# Patient Record
Sex: Female | Born: 1960
Health system: Southern US, Community
[De-identification: ages and names within clinical notes are randomized; demographics above are authoritative.]

## PROBLEM LIST (undated history)

## (undated) DIAGNOSIS — K5730 Diverticulosis of large intestine without perforation or abscess without bleeding: Secondary | ICD-10-CM

## (undated) DIAGNOSIS — M6289 Other specified disorders of muscle: Secondary | ICD-10-CM

## (undated) DIAGNOSIS — D369 Benign neoplasm, unspecified site: Secondary | ICD-10-CM

## (undated) DIAGNOSIS — R519 Headache, unspecified: Secondary | ICD-10-CM

## (undated) DIAGNOSIS — G4730 Sleep apnea, unspecified: Secondary | ICD-10-CM

## (undated) DIAGNOSIS — F329 Major depressive disorder, single episode, unspecified: Secondary | ICD-10-CM

## (undated) DIAGNOSIS — F319 Bipolar disorder, unspecified: Secondary | ICD-10-CM

## (undated) DIAGNOSIS — E669 Obesity, unspecified: Secondary | ICD-10-CM

## (undated) DIAGNOSIS — R51 Headache: Secondary | ICD-10-CM

## (undated) DIAGNOSIS — I10 Essential (primary) hypertension: Secondary | ICD-10-CM

## (undated) DIAGNOSIS — R87629 Unspecified abnormal cytological findings in specimens from vagina: Secondary | ICD-10-CM

## (undated) DIAGNOSIS — M722 Plantar fascial fibromatosis: Secondary | ICD-10-CM

## (undated) DIAGNOSIS — L409 Psoriasis, unspecified: Secondary | ICD-10-CM

## (undated) DIAGNOSIS — G629 Polyneuropathy, unspecified: Secondary | ICD-10-CM

## (undated) DIAGNOSIS — D649 Anemia, unspecified: Secondary | ICD-10-CM

## (undated) DIAGNOSIS — Z8719 Personal history of other diseases of the digestive system: Secondary | ICD-10-CM

## (undated) DIAGNOSIS — M1990 Unspecified osteoarthritis, unspecified site: Secondary | ICD-10-CM

## (undated) DIAGNOSIS — F32A Depression, unspecified: Secondary | ICD-10-CM

## (undated) DIAGNOSIS — E119 Type 2 diabetes mellitus without complications: Secondary | ICD-10-CM

## (undated) DIAGNOSIS — K219 Gastro-esophageal reflux disease without esophagitis: Secondary | ICD-10-CM

## (undated) DIAGNOSIS — F419 Anxiety disorder, unspecified: Secondary | ICD-10-CM

## (undated) HISTORY — DX: Depression, unspecified: F32.A

## (undated) HISTORY — PX: KNEE SURGERY: SHX244

## (undated) HISTORY — PX: TUBAL LIGATION: SHX77

## (undated) HISTORY — DX: Bipolar disorder, unspecified: F31.9

## (undated) HISTORY — DX: Type 2 diabetes mellitus without complications: E11.9

## (undated) HISTORY — DX: Unspecified osteoarthritis, unspecified site: M19.90

## (undated) HISTORY — DX: Headache, unspecified: R51.9

## (undated) HISTORY — DX: Psoriasis, unspecified: L40.9

## (undated) HISTORY — DX: Anxiety disorder, unspecified: F41.9

## (undated) HISTORY — PX: ABDOMINAL HYSTERECTOMY: SHX81

## (undated) HISTORY — DX: Benign neoplasm, unspecified site: D36.9

## (undated) HISTORY — DX: Obesity, unspecified: E66.9

## (undated) HISTORY — DX: Diverticulosis of large intestine without perforation or abscess without bleeding: K57.30

## (undated) HISTORY — DX: Unspecified abnormal cytological findings in specimens from vagina: R87.629

## (undated) HISTORY — DX: Plantar fascial fibromatosis: M72.2

## (undated) HISTORY — DX: Other specified disorders of muscle: M62.89

## (undated) HISTORY — DX: Headache: R51

## (undated) HISTORY — DX: Major depressive disorder, single episode, unspecified: F32.9

## (undated) HISTORY — PX: COLON SURGERY: SHX602

---

## 1999-11-27 ENCOUNTER — Encounter: Admission: RE | Admit: 1999-11-27 | Discharge: 1999-11-27 | Payer: Self-pay | Admitting: Family Medicine

## 1999-11-27 ENCOUNTER — Encounter: Payer: Self-pay | Admitting: Family Medicine

## 2000-11-09 ENCOUNTER — Ambulatory Visit (HOSPITAL_COMMUNITY): Admission: RE | Admit: 2000-11-09 | Discharge: 2000-11-09 | Payer: Self-pay | Admitting: Urology

## 2000-11-09 ENCOUNTER — Encounter: Payer: Self-pay | Admitting: Urology

## 2001-03-09 ENCOUNTER — Encounter: Payer: Self-pay | Admitting: Internal Medicine

## 2001-03-09 ENCOUNTER — Ambulatory Visit (HOSPITAL_COMMUNITY): Admission: RE | Admit: 2001-03-09 | Discharge: 2001-03-09 | Payer: Self-pay | Admitting: Internal Medicine

## 2001-10-06 ENCOUNTER — Encounter: Payer: Self-pay | Admitting: Family Medicine

## 2001-10-06 ENCOUNTER — Ambulatory Visit (HOSPITAL_COMMUNITY): Admission: RE | Admit: 2001-10-06 | Discharge: 2001-10-06 | Payer: Self-pay | Admitting: Family Medicine

## 2001-10-24 ENCOUNTER — Ambulatory Visit: Admission: RE | Admit: 2001-10-24 | Discharge: 2001-10-24 | Payer: Self-pay | Admitting: Family Medicine

## 2001-12-29 ENCOUNTER — Ambulatory Visit (HOSPITAL_COMMUNITY): Admission: RE | Admit: 2001-12-29 | Discharge: 2001-12-29 | Payer: Self-pay | Admitting: Family Medicine

## 2001-12-29 ENCOUNTER — Encounter: Payer: Self-pay | Admitting: Family Medicine

## 2002-02-06 ENCOUNTER — Inpatient Hospital Stay (HOSPITAL_COMMUNITY): Admission: RE | Admit: 2002-02-06 | Discharge: 2002-02-09 | Payer: Self-pay | Admitting: Obstetrics & Gynecology

## 2002-02-09 ENCOUNTER — Encounter: Payer: Self-pay | Admitting: Obstetrics & Gynecology

## 2002-02-21 ENCOUNTER — Encounter: Payer: Self-pay | Admitting: *Deleted

## 2002-02-21 ENCOUNTER — Emergency Department (HOSPITAL_COMMUNITY): Admission: EM | Admit: 2002-02-21 | Discharge: 2002-02-21 | Payer: Self-pay | Admitting: *Deleted

## 2002-03-12 ENCOUNTER — Ambulatory Visit (HOSPITAL_COMMUNITY): Admission: RE | Admit: 2002-03-12 | Discharge: 2002-03-12 | Payer: Self-pay | Admitting: Internal Medicine

## 2002-03-12 ENCOUNTER — Encounter: Payer: Self-pay | Admitting: Internal Medicine

## 2002-03-13 ENCOUNTER — Emergency Department (HOSPITAL_COMMUNITY): Admission: EM | Admit: 2002-03-13 | Discharge: 2002-03-14 | Payer: Self-pay | Admitting: Emergency Medicine

## 2002-03-14 ENCOUNTER — Encounter: Payer: Self-pay | Admitting: Internal Medicine

## 2002-03-28 ENCOUNTER — Inpatient Hospital Stay (HOSPITAL_COMMUNITY): Admission: RE | Admit: 2002-03-28 | Discharge: 2002-04-10 | Payer: Self-pay | Admitting: General Surgery

## 2002-03-30 ENCOUNTER — Encounter: Payer: Self-pay | Admitting: General Surgery

## 2002-03-30 HISTORY — PX: COLON RESECTION: SHX5231

## 2002-03-30 HISTORY — PX: LAPAROSCOPIC SALPINGOOPHERECTOMY: SUR795

## 2002-07-10 ENCOUNTER — Inpatient Hospital Stay (HOSPITAL_COMMUNITY): Admission: RE | Admit: 2002-07-10 | Discharge: 2002-07-17 | Payer: Self-pay | Admitting: General Surgery

## 2002-07-10 HISTORY — PX: COLOSTOMY CLOSURE: SHX1381

## 2002-07-12 ENCOUNTER — Encounter: Payer: Self-pay | Admitting: General Surgery

## 2002-07-13 ENCOUNTER — Encounter: Payer: Self-pay | Admitting: General Surgery

## 2002-07-15 ENCOUNTER — Encounter: Payer: Self-pay | Admitting: General Surgery

## 2003-01-07 ENCOUNTER — Emergency Department (HOSPITAL_COMMUNITY): Admission: EM | Admit: 2003-01-07 | Discharge: 2003-01-07 | Payer: Self-pay | Admitting: Emergency Medicine

## 2003-01-07 ENCOUNTER — Encounter: Payer: Self-pay | Admitting: Emergency Medicine

## 2003-03-10 ENCOUNTER — Emergency Department (HOSPITAL_COMMUNITY): Admission: EM | Admit: 2003-03-10 | Discharge: 2003-03-10 | Payer: Self-pay | Admitting: Emergency Medicine

## 2003-04-03 ENCOUNTER — Ambulatory Visit (HOSPITAL_COMMUNITY): Admission: RE | Admit: 2003-04-03 | Discharge: 2003-04-03 | Payer: Self-pay | Admitting: Obstetrics & Gynecology

## 2003-05-23 ENCOUNTER — Emergency Department (HOSPITAL_COMMUNITY): Admission: EM | Admit: 2003-05-23 | Discharge: 2003-05-23 | Payer: Self-pay | Admitting: Emergency Medicine

## 2003-07-15 ENCOUNTER — Ambulatory Visit (HOSPITAL_COMMUNITY): Admission: RE | Admit: 2003-07-15 | Discharge: 2003-07-15 | Payer: Self-pay | Admitting: Internal Medicine

## 2003-07-23 ENCOUNTER — Inpatient Hospital Stay (HOSPITAL_COMMUNITY): Admission: RE | Admit: 2003-07-23 | Discharge: 2003-07-26 | Payer: Self-pay | Admitting: Psychiatry

## 2003-07-24 ENCOUNTER — Ambulatory Visit (HOSPITAL_COMMUNITY): Admission: RE | Admit: 2003-07-24 | Discharge: 2003-07-24 | Payer: Self-pay | Admitting: Psychiatry

## 2003-08-27 ENCOUNTER — Ambulatory Visit (HOSPITAL_COMMUNITY): Admission: RE | Admit: 2003-08-27 | Discharge: 2003-08-27 | Payer: Self-pay | Admitting: Internal Medicine

## 2003-08-28 HISTORY — PX: COLONOSCOPY: SHX174

## 2003-09-12 ENCOUNTER — Emergency Department (HOSPITAL_COMMUNITY): Admission: EM | Admit: 2003-09-12 | Discharge: 2003-09-12 | Payer: Self-pay | Admitting: Emergency Medicine

## 2004-01-16 ENCOUNTER — Emergency Department (HOSPITAL_COMMUNITY): Admission: EM | Admit: 2004-01-16 | Discharge: 2004-01-16 | Payer: Self-pay | Admitting: Emergency Medicine

## 2004-01-20 ENCOUNTER — Emergency Department (HOSPITAL_COMMUNITY): Admission: EM | Admit: 2004-01-20 | Discharge: 2004-01-21 | Payer: Self-pay | Admitting: Emergency Medicine

## 2004-01-21 ENCOUNTER — Ambulatory Visit (HOSPITAL_COMMUNITY): Admission: RE | Admit: 2004-01-21 | Discharge: 2004-01-21 | Payer: Self-pay | Admitting: Preventative Medicine

## 2004-02-04 ENCOUNTER — Ambulatory Visit (HOSPITAL_COMMUNITY): Admission: RE | Admit: 2004-02-04 | Discharge: 2004-02-04 | Payer: Self-pay | Admitting: Orthopaedic Surgery

## 2004-02-04 HISTORY — PX: KNEE ARTHROSCOPY: SUR90

## 2004-02-07 ENCOUNTER — Encounter (HOSPITAL_COMMUNITY): Admission: RE | Admit: 2004-02-07 | Discharge: 2004-02-07 | Payer: Self-pay | Admitting: Orthopaedic Surgery

## 2004-02-11 ENCOUNTER — Ambulatory Visit: Payer: Self-pay | Admitting: Psychiatry

## 2004-02-13 ENCOUNTER — Encounter (HOSPITAL_COMMUNITY): Admission: RE | Admit: 2004-02-13 | Discharge: 2004-03-14 | Payer: Self-pay | Admitting: Orthopaedic Surgery

## 2004-04-07 ENCOUNTER — Ambulatory Visit: Payer: Self-pay | Admitting: Psychiatry

## 2004-04-09 ENCOUNTER — Ambulatory Visit (HOSPITAL_COMMUNITY): Admission: RE | Admit: 2004-04-09 | Discharge: 2004-04-09 | Payer: Self-pay | Admitting: Family Medicine

## 2004-06-26 ENCOUNTER — Ambulatory Visit (HOSPITAL_COMMUNITY): Admission: RE | Admit: 2004-06-26 | Discharge: 2004-06-26 | Payer: Self-pay | Admitting: Orthopaedic Surgery

## 2004-07-22 ENCOUNTER — Ambulatory Visit (HOSPITAL_COMMUNITY): Admission: RE | Admit: 2004-07-22 | Discharge: 2004-07-22 | Payer: Self-pay | Admitting: Obstetrics & Gynecology

## 2004-09-01 ENCOUNTER — Ambulatory Visit: Payer: Self-pay | Admitting: Psychiatry

## 2004-09-03 ENCOUNTER — Emergency Department (HOSPITAL_COMMUNITY): Admission: EM | Admit: 2004-09-03 | Discharge: 2004-09-03 | Payer: Self-pay | Admitting: Emergency Medicine

## 2004-09-29 ENCOUNTER — Emergency Department (HOSPITAL_COMMUNITY): Admission: EM | Admit: 2004-09-29 | Discharge: 2004-09-29 | Payer: Self-pay | Admitting: Emergency Medicine

## 2004-10-01 ENCOUNTER — Emergency Department (HOSPITAL_COMMUNITY): Admission: EM | Admit: 2004-10-01 | Discharge: 2004-10-01 | Payer: Self-pay | Admitting: Emergency Medicine

## 2004-10-01 ENCOUNTER — Ambulatory Visit: Payer: Self-pay | Admitting: Psychiatry

## 2004-10-01 ENCOUNTER — Inpatient Hospital Stay (HOSPITAL_COMMUNITY): Admission: RE | Admit: 2004-10-01 | Discharge: 2004-10-05 | Payer: Self-pay | Admitting: Psychiatry

## 2004-10-27 ENCOUNTER — Encounter (HOSPITAL_COMMUNITY): Admission: RE | Admit: 2004-10-27 | Discharge: 2004-11-26 | Payer: Self-pay | Admitting: Orthopedic Surgery

## 2004-11-02 ENCOUNTER — Ambulatory Visit: Payer: Self-pay | Admitting: Internal Medicine

## 2004-11-03 ENCOUNTER — Ambulatory Visit: Payer: Self-pay | Admitting: Psychiatry

## 2004-11-06 ENCOUNTER — Ambulatory Visit (HOSPITAL_COMMUNITY): Admission: RE | Admit: 2004-11-06 | Discharge: 2004-11-06 | Payer: Self-pay | Admitting: Internal Medicine

## 2004-11-30 ENCOUNTER — Encounter (HOSPITAL_COMMUNITY): Admission: RE | Admit: 2004-11-30 | Discharge: 2004-12-30 | Payer: Self-pay | Admitting: Orthopedic Surgery

## 2004-12-31 ENCOUNTER — Ambulatory Visit (HOSPITAL_COMMUNITY): Admission: RE | Admit: 2004-12-31 | Discharge: 2004-12-31 | Payer: Self-pay | Admitting: Orthopaedic Surgery

## 2005-01-13 ENCOUNTER — Encounter (HOSPITAL_COMMUNITY): Admission: RE | Admit: 2005-01-13 | Discharge: 2005-02-05 | Payer: Self-pay | Admitting: Orthopaedic Surgery

## 2005-01-26 ENCOUNTER — Ambulatory Visit: Payer: Self-pay | Admitting: Psychiatry

## 2005-03-05 ENCOUNTER — Emergency Department (HOSPITAL_COMMUNITY): Admission: EM | Admit: 2005-03-05 | Discharge: 2005-03-05 | Payer: Self-pay | Admitting: Emergency Medicine

## 2005-04-19 ENCOUNTER — Ambulatory Visit (HOSPITAL_COMMUNITY): Admission: RE | Admit: 2005-04-19 | Discharge: 2005-04-19 | Payer: Self-pay | Admitting: Family Medicine

## 2005-04-27 ENCOUNTER — Ambulatory Visit: Payer: Self-pay | Admitting: Psychiatry

## 2005-05-18 ENCOUNTER — Ambulatory Visit: Payer: Self-pay | Admitting: Psychiatry

## 2005-06-29 ENCOUNTER — Ambulatory Visit: Payer: Self-pay | Admitting: Internal Medicine

## 2005-08-11 ENCOUNTER — Ambulatory Visit (HOSPITAL_COMMUNITY): Admission: RE | Admit: 2005-08-11 | Discharge: 2005-08-11 | Payer: Self-pay | Admitting: Family Medicine

## 2005-08-24 ENCOUNTER — Ambulatory Visit (HOSPITAL_COMMUNITY): Payer: Self-pay | Admitting: Psychiatry

## 2005-09-07 ENCOUNTER — Ambulatory Visit (HOSPITAL_COMMUNITY): Payer: Self-pay | Admitting: Psychiatry

## 2005-09-28 ENCOUNTER — Emergency Department (HOSPITAL_COMMUNITY): Admission: EM | Admit: 2005-09-28 | Discharge: 2005-09-28 | Payer: Self-pay | Admitting: Emergency Medicine

## 2005-12-21 ENCOUNTER — Encounter (INDEPENDENT_AMBULATORY_CARE_PROVIDER_SITE_OTHER): Payer: Self-pay | Admitting: *Deleted

## 2005-12-21 ENCOUNTER — Ambulatory Visit (HOSPITAL_COMMUNITY): Admission: RE | Admit: 2005-12-21 | Discharge: 2005-12-21 | Payer: Self-pay | Admitting: Orthopaedic Surgery

## 2006-02-10 ENCOUNTER — Ambulatory Visit (HOSPITAL_COMMUNITY): Payer: Self-pay | Admitting: Psychiatry

## 2006-03-03 ENCOUNTER — Emergency Department (HOSPITAL_COMMUNITY): Admission: EM | Admit: 2006-03-03 | Discharge: 2006-03-03 | Payer: Self-pay | Admitting: Emergency Medicine

## 2006-03-08 ENCOUNTER — Ambulatory Visit (HOSPITAL_COMMUNITY): Payer: Self-pay | Admitting: Psychiatry

## 2006-05-05 ENCOUNTER — Ambulatory Visit (HOSPITAL_COMMUNITY): Admission: RE | Admit: 2006-05-05 | Discharge: 2006-05-05 | Payer: Self-pay | Admitting: Family Medicine

## 2006-07-06 ENCOUNTER — Ambulatory Visit: Payer: Self-pay | Admitting: Internal Medicine

## 2006-07-14 ENCOUNTER — Ambulatory Visit: Payer: Self-pay | Admitting: Gastroenterology

## 2006-10-04 ENCOUNTER — Ambulatory Visit (HOSPITAL_COMMUNITY): Payer: Self-pay | Admitting: Psychiatry

## 2006-10-21 ENCOUNTER — Ambulatory Visit (HOSPITAL_COMMUNITY): Payer: Self-pay | Admitting: Psychiatry

## 2006-11-03 ENCOUNTER — Ambulatory Visit (HOSPITAL_COMMUNITY): Payer: Self-pay | Admitting: Psychiatry

## 2006-11-23 ENCOUNTER — Ambulatory Visit (HOSPITAL_COMMUNITY): Payer: Self-pay | Admitting: Psychiatry

## 2006-12-01 ENCOUNTER — Ambulatory Visit (HOSPITAL_COMMUNITY): Payer: Self-pay | Admitting: Psychiatry

## 2006-12-28 ENCOUNTER — Ambulatory Visit (HOSPITAL_COMMUNITY): Payer: Self-pay | Admitting: Psychiatry

## 2007-01-17 ENCOUNTER — Ambulatory Visit (HOSPITAL_COMMUNITY): Payer: Self-pay | Admitting: Psychiatry

## 2007-01-31 ENCOUNTER — Ambulatory Visit: Payer: Self-pay | Admitting: Internal Medicine

## 2007-01-31 ENCOUNTER — Ambulatory Visit (HOSPITAL_COMMUNITY): Payer: Self-pay | Admitting: Psychiatry

## 2007-02-14 ENCOUNTER — Ambulatory Visit (HOSPITAL_COMMUNITY): Payer: Self-pay | Admitting: Psychiatry

## 2007-04-04 ENCOUNTER — Ambulatory Visit (HOSPITAL_COMMUNITY): Payer: Self-pay | Admitting: Psychiatry

## 2007-05-11 DIAGNOSIS — D369 Benign neoplasm, unspecified site: Secondary | ICD-10-CM

## 2007-05-11 DIAGNOSIS — K5730 Diverticulosis of large intestine without perforation or abscess without bleeding: Secondary | ICD-10-CM

## 2007-05-11 HISTORY — PX: LAPAROSCOPIC GASTRIC BANDING: SHX1100

## 2007-05-11 HISTORY — DX: Diverticulosis of large intestine without perforation or abscess without bleeding: K57.30

## 2007-05-11 HISTORY — DX: Benign neoplasm, unspecified site: D36.9

## 2007-05-18 ENCOUNTER — Ambulatory Visit (HOSPITAL_COMMUNITY): Admission: RE | Admit: 2007-05-18 | Discharge: 2007-05-18 | Payer: Self-pay | Admitting: Family Medicine

## 2007-07-07 ENCOUNTER — Emergency Department (HOSPITAL_COMMUNITY): Admission: EM | Admit: 2007-07-07 | Discharge: 2007-07-07 | Payer: Self-pay | Admitting: Emergency Medicine

## 2007-09-06 ENCOUNTER — Other Ambulatory Visit: Admission: RE | Admit: 2007-09-06 | Discharge: 2007-09-06 | Payer: Self-pay | Admitting: Obstetrics & Gynecology

## 2007-10-16 ENCOUNTER — Ambulatory Visit (HOSPITAL_COMMUNITY): Admission: RE | Admit: 2007-10-16 | Discharge: 2007-10-16 | Payer: Self-pay | Admitting: Surgery

## 2007-10-17 ENCOUNTER — Encounter: Admission: RE | Admit: 2007-10-17 | Discharge: 2007-10-17 | Payer: Self-pay | Admitting: Surgery

## 2007-10-26 ENCOUNTER — Ambulatory Visit (HOSPITAL_COMMUNITY): Admission: RE | Admit: 2007-10-26 | Discharge: 2007-10-26 | Payer: Self-pay | Admitting: Surgery

## 2007-11-23 ENCOUNTER — Ambulatory Visit: Payer: Self-pay | Admitting: Internal Medicine

## 2007-12-11 ENCOUNTER — Ambulatory Visit (HOSPITAL_COMMUNITY): Admission: RE | Admit: 2007-12-11 | Discharge: 2007-12-11 | Payer: Self-pay | Admitting: Internal Medicine

## 2007-12-11 ENCOUNTER — Encounter: Payer: Self-pay | Admitting: Internal Medicine

## 2007-12-11 ENCOUNTER — Ambulatory Visit: Payer: Self-pay | Admitting: Internal Medicine

## 2007-12-11 HISTORY — PX: COLONOSCOPY: SHX174

## 2008-01-01 ENCOUNTER — Encounter (HOSPITAL_COMMUNITY): Admission: RE | Admit: 2008-01-01 | Discharge: 2008-01-31 | Payer: Self-pay | Admitting: Internal Medicine

## 2008-03-05 ENCOUNTER — Encounter: Admission: RE | Admit: 2008-03-05 | Discharge: 2008-03-19 | Payer: Self-pay | Admitting: Surgery

## 2008-03-19 ENCOUNTER — Ambulatory Visit (HOSPITAL_COMMUNITY): Admission: RE | Admit: 2008-03-19 | Discharge: 2008-03-20 | Payer: Self-pay | Admitting: Surgery

## 2008-04-02 ENCOUNTER — Encounter: Admission: RE | Admit: 2008-04-02 | Discharge: 2008-04-02 | Payer: Self-pay | Admitting: Surgery

## 2008-05-13 ENCOUNTER — Emergency Department (HOSPITAL_COMMUNITY): Admission: EM | Admit: 2008-05-13 | Discharge: 2008-05-13 | Payer: Self-pay | Admitting: Emergency Medicine

## 2008-06-04 ENCOUNTER — Ambulatory Visit (HOSPITAL_COMMUNITY): Admission: RE | Admit: 2008-06-04 | Discharge: 2008-06-04 | Payer: Self-pay | Admitting: Family Medicine

## 2008-09-02 DIAGNOSIS — K5900 Constipation, unspecified: Secondary | ICD-10-CM

## 2008-09-02 DIAGNOSIS — K219 Gastro-esophageal reflux disease without esophagitis: Secondary | ICD-10-CM

## 2008-09-02 DIAGNOSIS — E119 Type 2 diabetes mellitus without complications: Secondary | ICD-10-CM

## 2008-09-02 DIAGNOSIS — E669 Obesity, unspecified: Secondary | ICD-10-CM | POA: Insufficient documentation

## 2008-09-02 DIAGNOSIS — F39 Unspecified mood [affective] disorder: Secondary | ICD-10-CM | POA: Insufficient documentation

## 2008-09-02 DIAGNOSIS — F411 Generalized anxiety disorder: Secondary | ICD-10-CM | POA: Insufficient documentation

## 2008-09-02 DIAGNOSIS — M1990 Unspecified osteoarthritis, unspecified site: Secondary | ICD-10-CM | POA: Insufficient documentation

## 2008-09-03 ENCOUNTER — Ambulatory Visit: Payer: Self-pay | Admitting: Internal Medicine

## 2008-09-10 ENCOUNTER — Encounter: Payer: Self-pay | Admitting: Internal Medicine

## 2008-09-16 ENCOUNTER — Other Ambulatory Visit: Admission: RE | Admit: 2008-09-16 | Discharge: 2008-09-16 | Payer: Self-pay | Admitting: Obstetrics & Gynecology

## 2008-10-09 ENCOUNTER — Ambulatory Visit: Payer: Self-pay | Admitting: Internal Medicine

## 2008-10-10 DIAGNOSIS — D126 Benign neoplasm of colon, unspecified: Secondary | ICD-10-CM | POA: Insufficient documentation

## 2008-12-20 ENCOUNTER — Ambulatory Visit (HOSPITAL_COMMUNITY): Payer: Self-pay | Admitting: Psychiatry

## 2008-12-25 ENCOUNTER — Ambulatory Visit (HOSPITAL_COMMUNITY): Payer: Self-pay | Admitting: Psychiatry

## 2009-01-14 ENCOUNTER — Ambulatory Visit (HOSPITAL_COMMUNITY): Payer: Self-pay | Admitting: Psychiatry

## 2009-01-21 ENCOUNTER — Ambulatory Visit (HOSPITAL_COMMUNITY): Payer: Self-pay | Admitting: Psychiatry

## 2009-02-03 ENCOUNTER — Ambulatory Visit (HOSPITAL_COMMUNITY): Payer: Self-pay | Admitting: Psychiatry

## 2009-02-13 ENCOUNTER — Ambulatory Visit (HOSPITAL_COMMUNITY): Payer: Self-pay | Admitting: Psychiatry

## 2009-02-17 ENCOUNTER — Ambulatory Visit (HOSPITAL_COMMUNITY): Payer: Self-pay | Admitting: Psychiatry

## 2009-03-27 ENCOUNTER — Encounter (INDEPENDENT_AMBULATORY_CARE_PROVIDER_SITE_OTHER): Payer: Self-pay | Admitting: *Deleted

## 2009-04-01 ENCOUNTER — Ambulatory Visit (HOSPITAL_COMMUNITY): Payer: Self-pay | Admitting: Psychiatry

## 2009-04-17 ENCOUNTER — Ambulatory Visit (HOSPITAL_COMMUNITY): Payer: Self-pay | Admitting: Psychiatry

## 2009-05-13 ENCOUNTER — Ambulatory Visit (HOSPITAL_COMMUNITY): Payer: Self-pay | Admitting: Psychiatry

## 2009-05-15 ENCOUNTER — Ambulatory Visit (HOSPITAL_COMMUNITY): Payer: Self-pay | Admitting: Psychiatry

## 2009-06-09 ENCOUNTER — Ambulatory Visit (HOSPITAL_COMMUNITY): Payer: Self-pay | Admitting: Psychiatry

## 2009-06-13 ENCOUNTER — Ambulatory Visit (HOSPITAL_COMMUNITY): Admission: RE | Admit: 2009-06-13 | Discharge: 2009-06-13 | Payer: Self-pay | Admitting: Family Medicine

## 2009-07-10 ENCOUNTER — Ambulatory Visit (HOSPITAL_COMMUNITY): Payer: Self-pay | Admitting: Psychiatry

## 2009-07-21 ENCOUNTER — Ambulatory Visit (HOSPITAL_COMMUNITY): Payer: Self-pay | Admitting: Psychiatry

## 2009-08-07 ENCOUNTER — Ambulatory Visit (HOSPITAL_COMMUNITY): Payer: Self-pay | Admitting: Psychiatry

## 2009-08-20 ENCOUNTER — Ambulatory Visit (HOSPITAL_COMMUNITY): Payer: Self-pay | Admitting: Psychiatry

## 2009-09-22 ENCOUNTER — Other Ambulatory Visit: Admission: RE | Admit: 2009-09-22 | Discharge: 2009-09-22 | Payer: Self-pay | Admitting: Obstetrics & Gynecology

## 2009-09-23 ENCOUNTER — Ambulatory Visit (HOSPITAL_COMMUNITY): Payer: Self-pay | Admitting: Psychiatry

## 2009-09-24 ENCOUNTER — Ambulatory Visit (HOSPITAL_COMMUNITY): Payer: Self-pay | Admitting: Psychiatry

## 2009-10-20 ENCOUNTER — Ambulatory Visit: Payer: Self-pay | Admitting: Internal Medicine

## 2009-10-20 DIAGNOSIS — R112 Nausea with vomiting, unspecified: Secondary | ICD-10-CM

## 2009-10-30 ENCOUNTER — Ambulatory Visit (HOSPITAL_COMMUNITY): Payer: Self-pay | Admitting: Psychiatry

## 2009-11-25 ENCOUNTER — Ambulatory Visit (HOSPITAL_COMMUNITY): Payer: Self-pay | Admitting: Psychiatry

## 2009-11-27 ENCOUNTER — Telehealth (INDEPENDENT_AMBULATORY_CARE_PROVIDER_SITE_OTHER): Payer: Self-pay | Admitting: *Deleted

## 2009-11-27 ENCOUNTER — Ambulatory Visit (HOSPITAL_COMMUNITY): Payer: Self-pay | Admitting: Psychiatry

## 2009-11-28 ENCOUNTER — Ambulatory Visit: Payer: Self-pay | Admitting: Internal Medicine

## 2009-12-25 ENCOUNTER — Ambulatory Visit (HOSPITAL_COMMUNITY): Payer: Self-pay | Admitting: Psychiatry

## 2009-12-31 ENCOUNTER — Encounter: Payer: Self-pay | Admitting: Internal Medicine

## 2010-01-22 ENCOUNTER — Ambulatory Visit (HOSPITAL_COMMUNITY): Payer: Self-pay | Admitting: Psychiatry

## 2010-01-27 ENCOUNTER — Ambulatory Visit (HOSPITAL_COMMUNITY): Payer: Self-pay | Admitting: Psychiatry

## 2010-02-26 ENCOUNTER — Ambulatory Visit (HOSPITAL_COMMUNITY): Payer: Self-pay | Admitting: Psychiatry

## 2010-03-13 ENCOUNTER — Encounter: Payer: Self-pay | Admitting: Internal Medicine

## 2010-03-31 ENCOUNTER — Ambulatory Visit (HOSPITAL_COMMUNITY): Payer: Self-pay | Admitting: Psychiatry

## 2010-05-28 ENCOUNTER — Other Ambulatory Visit (HOSPITAL_COMMUNITY): Payer: Self-pay | Admitting: Family Medicine

## 2010-05-28 DIAGNOSIS — Z0000 Encounter for general adult medical examination without abnormal findings: Secondary | ICD-10-CM

## 2010-05-31 ENCOUNTER — Encounter: Payer: Self-pay | Admitting: Obstetrics and Gynecology

## 2010-05-31 ENCOUNTER — Encounter: Payer: Self-pay | Admitting: Family Medicine

## 2010-06-02 ENCOUNTER — Ambulatory Visit (HOSPITAL_COMMUNITY)
Admission: RE | Admit: 2010-06-02 | Discharge: 2010-06-02 | Payer: Self-pay | Source: Home / Self Care | Attending: Psychiatry | Admitting: Psychiatry

## 2010-06-11 NOTE — Progress Notes (Signed)
Summary: recommendation confirmed for referral to [facility] if pt still wants  Will contact patient and [facility] clinic  ---- Converted from flag ---- ---- 11/27/2009 2:30 PM, Hendricks Limes LPN wrote: LSL spoke with RMR and he recommended referral to [facility] if pt was still having problems  ---- 11/27/2009 1:12 PM, Minna Merritts wrote: can we make sure that provider agrees with recommendation for referral to [facility]...without order and at pt request is kind of conflicting but would proceed if provider indicates ok. ------------------------------  Appended Document: recommendation confirmed for referral to [facility] if pt still wants Referral paperwork and records faxed to [facility] clinic; awaiting appt confirmation

## 2010-06-11 NOTE — Assessment & Plan Note (Signed)
Summary: DROPPED OFF STOOL/SS   Pt returned one iFOBT and it was negative.      Allergies: 1)  ! Codeine 2)  ! Bactrim  Other Orders: Immuno-chemical Fecal Occult (95621)  Appended Document: DROPPED OFF STOOL/SS good; proceed w [facility] ref.  Appended Document: DROPPED OFF STOOL/SS [facility] referral has already been made; awaiting appt confirmation

## 2010-06-11 NOTE — Letter (Signed)
Summary: [facility] referral  [facility] referral   Imported By: Minna Merritts 11/28/2009 17:44:23  _____________________________________________________________________  External Attachment:    Type:   Image     Comment:   External Document

## 2010-06-11 NOTE — Letter (Signed)
Summary: CLINIC NOTE FROM [facility] NOTE FROM [facility]   Imported By: Rexene Alberts 12/31/2009 16:51:28  _____________________________________________________________________  External Attachment:    Type:   Image     Comment:   External Document

## 2010-06-11 NOTE — Assessment & Plan Note (Signed)
Summary: PASSING OUT,NAUSEA,CAN'T HAVE A BOWEL MOVEMENT PT THINKS SHE .Marland KitchenMarland Kitchen   Visit Type:  f/u Primary Care Provider:  [place]  Chief Complaint:  passing out, constipation, and nausea.  History of Present Illness: [name] is here for further evaluation of ongoing constipation, nausea, and syncope. Last seen one year ago. She has h/o lapband placement in 2009. She continues to loose weight, total of 80 pounds. She c/o ongoing pp n/v. Worse with stress. Her husband has PTSD and it makes her "crazy". She has f/u with bariatric surgeon in couple of weeks regarding n/v. She is concerned she may have ulcer. She denies heartburn, hematemesis, abd pain. She continues to have ongoing constipation. She gets urge to have BM but has trouble evacuating. She has BM twice per week. Stool is small in quantity and she has to strain. She now has syncope twice per week. She says it happens when she gets urge to have BM. Does not always occur with having the BM/straining. She saw neurologist and cardiologist who ran test and cleared her. She has to use enema when she gets urge in order to get the stool out. With questioning her about the Miralax, she is actually taking on one teaspoon twice a day, not 17g two times a day. Her sister recently had partial colectomy with colostomy bag for complicated diverticulitis.   Current Medications (verified): 1)  Xanax 1 Mg Tabs (Alprazolam) .... Take 1 Tablet By Mouth Two Times A Day 2)  Amitiza 24 Mcg Caps (Lubiprostone) .... Taking 2 Daily 3)  Prilosec Otc 20 Mg Tbec (Omeprazole Magnesium) .... Two Times A Day 4)  Stool Softner .... Take Two Every Day 5)  Miralax 34 Grams .... Once Daily 6)  Citracel .... As Directed 7)  Claritin .... Once Daily 8)  Flax Seed Oil .... Once Daily 9)  Fleets Enema .... As Directed 10)  Byetta 10 Mcg Pen 10 Mcg/0.41ml Soln (Exenatide) .... Morning and Lunch Time 11)  Ultracet 37.5-325 Mg Tabs (Tramadol-Acetaminophen) .... As Needed 12)  Equetro  300 Mg Xr12h-Cap (Carbamazepine (Antipsychotic)) .... Two Times A Day  Allergies (verified): 1)  ! Codeine 2)  ! Bactrim  Past History:  Past Medical History: TCS, 8/09-->marginal prep, surgical anastomosis at 25cm, pancolonic diverticula, descending colon adenoma, surveillance exam at 12/2012 osteoarthritis Anxiety Disorder Depression Diabetes  Past Surgical History: Reviewed history from 09/03/2008 and no changes required. HYSTERECTOMY SIGMOID RESECTION WITH COLOSTOMY AND SUBSEQUENT WAS TAKEDOWN ARTHROSCOPIC SURGERY BOTH KNEES HEEL SPUR SURGERY TUBAL LIGATION Lap Band Surgery  Family History: Father: deceased 72 cirrhosis Mother: deceased 22 CHF & Ovarian Cancer Siblings: 2 Aunt, CRC, age 33 Aunt, Breast cancer, age 40, deceased Cousin, deceased age 58, liver cancer  Social History: Marital Status: Married Children: 2 Boys Occupation: Control and instrumentation engineer, social work None smoker, no alcohol.  Review of Systems General:  Denies fever, chills, sweats, anorexia, fatigue, weakness, and weight loss. Eyes:  Denies vision loss. ENT:  Denies nasal congestion, sore throat, hoarseness, and difficulty swallowing. CV:  Denies chest pains, angina, palpitations, dyspnea on exertion, and peripheral edema. Resp:  Denies dyspnea at rest, dyspnea with exercise, cough, sputum, and wheezing. GI:  See HPI. GU:  Denies urinary burning and blood in urine. MS:  Denies joint pain / LOM. Derm:  Denies rash and itching. Neuro:  Complains of syncope; denies weakness, paralysis, frequent headaches, memory loss, and confusion. Psych:  Denies depression, anxiety, memory loss, and suicidal ideation. Endo:  Denies unusual weight change. Heme:  Denies  bruising and bleeding. Allergy:  Denies hives and rash.  Vital Signs:  Patient profile:   50 year old female Height:      67 inches Weight:      222 pounds BMI:     34.90 Temp:     97.3 degrees F oral Pulse rate:   60 / minute BP sitting:    118 / 72  (left arm) Cuff size:   regular  Vitals Entered By: Hendricks Limes LPN (October 20, 2009 10:58 AM)  Physical Exam  General:  Well developed, well nourished, no acute distress. Head:  Normocephalic and atraumatic. Eyes:  Conjunctivae pink, no scleral icterus.  Mouth:  Oropharyngeal mucosa moist, pink.  No lesions, erythema or exudate.    Neck:  Supple; no masses or thyromegaly. Lungs:  Clear throughout to auscultation. Heart:  Regular rate and rhythm; no murmurs, rubs,  or bruits. Abdomen:  lap band port in right upper abd. Abd soft. NT. ND. No HSM or masses. No abd bruit or hernia. Positive BS. Extremities:  No clubbing, cyanosis, edema or deformities noted. Neurologic:  Alert and  oriented x4;  grossly normal neurologically. Skin:  Intact without significant lesions or rashes. Psych:  Alert and cooperative. Normal mood and affect.  Impression & Recommendations:  Problem # 1:  CONSTIPATION (ICD-564.00)  Persistent constipation. Prior Sitz marker study unremarkable. She describes difficulty with evacuation. She needs to increase her Miralax. She is taking only fraction of full dose. Increase to 17g two times a day. She will likely need referral to [facility] for anorectal manometry+/-defacography to access for functional outlet obstruction. She enquires about having another colonoscopy prior to referral. To discuss with Dr. Jena Gauss.  Syncope may be due to vasovagal response.   Orders: Est. Patient Level III (96045)  Problem # 2:  NAUSEA WITH VOMITING (ICD-787.01)  Postprandial n/v, s/p lap band. Symptoms intermittent and often without notice. Patient is to f/u with bariatric surgeon as planned. If symptoms persist, consider UGI series for further evaluation.   Orders: Est. Patient Level III (40981) Prescriptions: AMITIZA 24 MCG CAPS (LUBIPROSTONE) Take two daily with food  #60 x 5   Entered and Authorized by:   Leanna Battles. Dixon [name]   Signed by:   Leanna Battles Navi Erber PA-C on  10/20/2009   Method used:   Electronically to        [facility]. [phone]* (retail)       [address] [place], [place]  [address]       Ph: [phone]       Fax: [phone]   RxID:   [id number]   Appended Document: PASSING OUT,NAUSEA,CAN'T HAVE A BOWEL MOVEMENT PT THINKS SHE .Marland KitchenMarland Kitchen Please let pt know...finally discussed with RMR.  He wants to do ifobt. If negative, no need for TCS now. Recommends referral to [facility] for anorectal manometry +/- defography IF she is still having problems with constipation and evacuation of stool.   Appended Document: PASSING OUT,NAUSEA,CAN'T HAVE A BOWEL MOVEMENT PT THINKS SHE .Marland Kitchen. pt answered phone and said she was in an appt and would call back later  Appended Document: PASSING OUT,NAUSEA,CAN'T HAVE A BOWEL MOVEMENT PT THINKS SHE .Marland Kitchen. pt aware, will pick up ifobt today. left at front desk. pt still having problems with constipation and would like referral to [facility]

## 2010-06-11 NOTE — Letter (Signed)
Summary: [facility] PT BROUGHT BY OFFICE  [facility] RECORDS PT BROUGHT BY OFFICE   Imported By: Diana Eves 03/13/2010 13:05:50  _____________________________________________________________________  External Attachment:    Type:   Image     Comment:   External Document

## 2010-06-12 ENCOUNTER — Ambulatory Visit (HOSPITAL_COMMUNITY): Payer: Self-pay

## 2010-06-15 ENCOUNTER — Ambulatory Visit (HOSPITAL_COMMUNITY)
Admission: RE | Admit: 2010-06-15 | Discharge: 2010-06-15 | Disposition: A | Discharge status: Home / Self Care | Payer: Medicare Other | Source: Ambulatory Visit | Attending: Family Medicine | Admitting: Family Medicine

## 2010-06-15 ENCOUNTER — Ambulatory Visit (HOSPITAL_COMMUNITY): Admission: RE | Admit: 2010-06-15 | Payer: Self-pay | Source: Home / Self Care | Admitting: Family Medicine

## 2010-06-15 DIAGNOSIS — Z0000 Encounter for general adult medical examination without abnormal findings: Secondary | ICD-10-CM

## 2010-06-15 DIAGNOSIS — Z1231 Encounter for screening mammogram for malignant neoplasm of breast: Secondary | ICD-10-CM | POA: Insufficient documentation

## 2010-07-28 ENCOUNTER — Encounter (INDEPENDENT_AMBULATORY_CARE_PROVIDER_SITE_OTHER): Payer: Medicare Other | Admitting: Psychiatry

## 2010-07-28 DIAGNOSIS — F3189 Other bipolar disorder: Secondary | ICD-10-CM

## 2010-07-30 ENCOUNTER — Other Ambulatory Visit (HOSPITAL_COMMUNITY): Payer: Self-pay | Admitting: Family Medicine

## 2010-07-30 ENCOUNTER — Other Ambulatory Visit: Payer: Self-pay | Admitting: Internal Medicine

## 2010-07-30 DIAGNOSIS — R61 Generalized hyperhidrosis: Secondary | ICD-10-CM

## 2010-07-30 DIAGNOSIS — H539 Unspecified visual disturbance: Secondary | ICD-10-CM

## 2010-08-03 ENCOUNTER — Ambulatory Visit (HOSPITAL_COMMUNITY): Payer: Medicare Other

## 2010-08-10 ENCOUNTER — Ambulatory Visit
Admission: RE | Admit: 2010-08-10 | Discharge: 2010-08-10 | Disposition: A | Discharge status: Home / Self Care | Payer: Medicare Other | Source: Ambulatory Visit | Attending: Family Medicine | Admitting: Family Medicine

## 2010-08-10 DIAGNOSIS — R61 Generalized hyperhidrosis: Secondary | ICD-10-CM

## 2010-08-10 DIAGNOSIS — H539 Unspecified visual disturbance: Secondary | ICD-10-CM

## 2010-08-10 MED ORDER — GADOBENATE DIMEGLUMINE 529 MG/ML IV SOLN
10.0000 mL | Freq: Once | INTRAVENOUS | Status: AC | PRN
  Administered 2010-08-10: 10 mL via INTRAVENOUS

## 2010-08-24 LAB — CBC
Hemoglobin: 14.6 g/dL (ref 12.0–15.0)
MCHC: 32.8 g/dL (ref 30.0–36.0)
Platelets: 197 10*3/uL (ref 150–400)
RDW: 14.4 % (ref 11.5–15.5)
WBC: 10.2 10*3/uL (ref 4.0–10.5)

## 2010-08-24 LAB — DIFFERENTIAL
Basophils Relative: 1 % (ref 0–1)
Eosinophils Absolute: 0.2 10*3/uL (ref 0.0–0.7)
Lymphs Abs: 2.8 10*3/uL (ref 0.7–4.0)
Monocytes Absolute: 0.8 10*3/uL (ref 0.1–1.0)
Monocytes Relative: 7 % (ref 3–12)

## 2010-08-24 LAB — COMPREHENSIVE METABOLIC PANEL
ALT: 18 U/L (ref 0–35)
AST: 23 U/L (ref 0–37)
Albumin: 3.6 g/dL (ref 3.5–5.2)
Alkaline Phosphatase: 75 U/L (ref 39–117)
Calcium: 8.6 mg/dL (ref 8.4–10.5)
GFR calc Af Amer: >60 mL/min (ref >60)
Potassium: 4.2 mEq/L (ref 3.5–5.1)
Sodium: 136 mEq/L (ref 135–145)
Total Protein: 6.8 g/dL (ref 6.0–8.3)

## 2010-08-24 LAB — URINALYSIS, ROUTINE W REFLEX MICROSCOPIC
Bilirubin Urine: NEGATIVE
Glucose, UA: NEGATIVE mg/dL
Hgb urine dipstick: NEGATIVE
Specific Gravity, Urine: 1.01 (ref 1.005–1.030)

## 2010-09-15 ENCOUNTER — Encounter (INDEPENDENT_AMBULATORY_CARE_PROVIDER_SITE_OTHER): Payer: Self-pay | Admitting: Surgery

## 2010-09-22 ENCOUNTER — Encounter (INDEPENDENT_AMBULATORY_CARE_PROVIDER_SITE_OTHER): Payer: Medicare Other | Admitting: Psychiatry

## 2010-09-22 DIAGNOSIS — F3189 Other bipolar disorder: Secondary | ICD-10-CM

## 2010-09-22 NOTE — H&P (Signed)
NAMEPORCHE, STEINBERGER             ACCOUNT NO.:  [id number]   MEDICAL RECORD NO.:  [id number]          PATIENT TYPE:  AMB   LOCATION:  DAY                           FACILITY:  [facility]   PHYSICIAN:  R. Roetta Sessions, M.D. DATE OF BIRTH:  [birth year]   DATE OF ADMISSION:  10/16/2007  DATE OF DISCHARGE:  LH                              HISTORY & PHYSICAL   CHIEF COMPLAINT:  Refractory constipation, wants a colonoscopy for  bariatric surgery.   Mrs. [name] is a very pleasant morbidly obese 50 year old  lady previously seen by Dr. Karilyn Cota.  She has come to see me to desire a  colonoscopy and evaluate constipation further.  She had been constipated  for years.  She has a history of complicated diverticulitis with abscess  formation requiring sigmoid resection and colostomy with subsequent  takedown previously by Dr. Katrinka Blazing.  Her last colonoscopy was in 2005,  done by Dr. Karilyn Cota.  Because of poor response to treatment for  constipation, she did have residual diverticula, but no findings  consistent with adenoma or neoplasia.  She has not passed any blood per  rectum.  She barely goes 2-3 times weekly as far as bowel movements are  concerned despite taking Amitiza 24 mcg b.i.d. and MiraLax 70 grams  orally daily in addition to Citrucel, she also takes Fleet Enema on a  regular basis.  She has struggled with obesity for years.  She has seen  Dr. Benancio Deeds down at the [facility] and she has gone  through the evaluation with plans to perform a laparoscopic band  procedure in September or October and she asked that a colonoscopy be  done to make sure there is nothing going on in her colon prior to this  procedure.  She does tell me that she actually had aunt who died of  colorectal cancer in her 30s.  No first-degree relatives with polyps or  cancer.  She has gastroesophageal reflux.  These symptoms are well-  controlled on Prilosec 20 mg orally b.i.d.   PAST MEDICAL  HISTORY:  1. Depression.  2. Anxiety neurosis.  3. Osteoarthritis.  4. Type 2 diabetes mellitus.   CURRENT MEDICATIONS:  1. Metformin 500 grams b.i.d.  2. Xanax 1 mg daily.  3. Stool softener 2 daily.  4. MiraLax 17 g orally daily.  5. Citrucel fiber 1 dose daily.  6. Darvocet 1 p.r.n. for knee pain.  7. Claritin daily.  8. Flaxseed daily.  9. Fleet enemas daily p.r.n.  10.Amitiza 24 mcg b.i.d.  11.Citrucel supplement daily.  12.Prilosec 20 mg orally b.i.d.   ALLERGIES:  CODEINE and BACTRIM.   PAST SURGERIES:  Hysterectomy, sigmoid resection with colostomy and  subsequent was takedown, arthroscopic surgery both knees, heel spur  surgery, tubal ligation.   FAMILY HISTORY:  Colon cancer at young age.   SOCIAL HISTORY:  The patient is married.  She is unemployed.  No  alcohol.  No tobacco.  No illicit drugs.  No recent chest pain, dyspnea  on exertion.  Does not have any early satiety, dysphagia, nausea, or  vomiting.  PHYSICAL EXAMINATION:  GENERAL: A Pleasant 50 year old lady resting  comfortably.  No acute distress.  GENERAL:  Weight 282.5, height 5 feet 7, temp 98, BP 110/70, and pulse  84.  SKIN:  Warm and dry.  HEENT:  No scleral icterus.  Conjunctivae are pink.  CHEST:  Lungs are clear to auscultation.  CARDIAC:  Regular rate and rhythm without murmur, gallop, or rub.  ABDOMEN:  Massively obese, well-healed surgical scars.  Positive bowel  sounds, soft, nontender without appreciable mass or organomegaly.  EXTREMITIES:  No edema.  RECTAL:  Two small external hemorrhoid tags, good sphincter tone and no  mass.  No stool in the rectal vault.  Mucous Hemoccult negative.   IMPRESSION:  [name] is a pleasant 50 year old lady with  history of complicated diverticulitis as described above with refractory  constipation.  She is morbidly obese and bariatric surgery is  forthcoming.  [name] very much wants to have a colonoscopy to  clear her colon  prior to undergoing bariatric surgery.   She has had ongoing refractory symptoms of constipation now for years.  She had residual diverticula, but nothing else a little over 4 years ago  at her last colonoscopy.  I told [name] the likelihood of finding  the significant pathology would be relatively low.  However, she is  desirous of a colonoscopy and wants peace of mind.   She has a second-degree relative with colon cancer.   All things considered, I do not think it would  be unreasonable to go  ahead and recheck her colon prior to major GI surgery.   PLAN:  Plan in the near future.  I told Ms. Benninger about the risks,  benefits, alternatives, and limitations of a colonoscopy now.  Her  questions were answered.  She is agreeable.  We will go ahead and plan  to perform a colonoscopy at [facility] in the near future.  Further  recommendations to follow.      Jonathon Bellows, M.D.  Electronically Signed     RMR/MEDQ  D:  11/23/2007  T:  11/24/2007  Job:  [id number]   cc:   Madelin Rear. Sherwood Gambler, MD  Fax: [phone]   Dr.  [facility].

## 2010-09-22 NOTE — Op Note (Signed)
[name], [name]             ACCOUNT NO.:  [id number]   MEDICAL RECORD NO.:  [id number]          PATIENT TYPE:  AMB   LOCATION:  DAY                           FACILITY:  [facility]   PHYSICIAN:  R. Roetta Sessions, M.D. DATE OF BIRTH:  1960-09-12   DATE OF PROCEDURE:  12/11/2007  DATE OF DISCHARGE:                               OPERATIVE REPORT   PROCEDURE:  Colonoscopy with biopsy.   INDICATIONS FOR PROCEDURE:  A 50 year old lady, morbidly obese, with  history of complicated diverticulitis requiring resection, colostomy,  and subsequent takedown.  She is having refractory constipation and  things seem to be getting worse more recently.  She takes Amitiza 24 mcg  b.i.d. and MiraLax daily and struggles to have a bowel movement, has  second-degree relative with colon cancer.  Colonoscopy is now being  done.  This approach has been discussed with the patient at length, and  potential risks, benefits, and alternatives have been reviewed,  questions answered.  Please see documentation in the medical record.   PROCEDURE NOTE:  O2 saturation, blood pressure, pulse, and respiration  were monitored throughout the entire procedure.   CONSCIOUS SEDATION:  Versed 6 mg IV and Demerol 150 mg IV in divided  doses.   INSTRUMENT:  Pentax video chip system.   FINDINGS:  Digital rectal exam revealed no abnormalities.  Endoscopic  findings:  The prep was marginal.  There was some vegetable material and  liquid stool throughout the colon, which had to be dealt with via lavage  through the scope.  Colon:  Colonic mucosa was surveyed from the  rectosigmoid junction through the left transverse and right colon to the  appendiceal orifice, ileocecal valve, and cecum.  These structures were  well seen and photographed for the record.  From this level, scope was  slowly and cautiously withdrawn.  All previously-mentioned mucosal  surfaces were again seen.  The patient had scattered pancolonic  diverticula.   There is a diminutive polyp, mid descending colon, this  was cold biopsied/removed.  Surgical anastomosis 25 cm from the rectum.  Otherwise, the residual colonic mucosa appeared normal.  Scope was  pulled down to the rectum where thorough examination of rectal mucosa  including retroflexed view of the anal verge demonstrated no  abnormalities.  The patient tolerated the procedure well and was  reactive to endoscopy.   IMPRESSION:  1. Marginal prep.  2. Normal rectum.  3. Status post surgical anastomosis at 25 cm.  4. Pancolonic diverticula.  5. Polyp, mid descending colon, status post cold biopsy removal.  6. Remainder of colonic mucosa appeared unremarkable.   I feel this lady has a significant problem with constipation.  I get the  sense this is more of a colonic inertia than a functional outlet problem  issue.   RECOMMENDATIONS:  1. Constipation needs to be sorted out further before she goes for      bariatric surgery.  Go ahead and check a TSH, serum calcium, and      albumin today, and we will go ahead and do a sitz marker study next  week.  She is to hold Amitiza and MiraLax for 3 days prior to the      sitz marker study.  2. Further recommendations to follow.      Jonathon Bellows, M.D.  Electronically Signed     RMR/MEDQ  D:  12/11/2007  T:  12/11/2007  Job:  [id number]   cc:   Madelin Rear. Sherwood Gambler, MD  Fax: [phone]   Dr. Rex Kras

## 2010-09-22 NOTE — Assessment & Plan Note (Signed)
[name], [name]              CHART#:  [id number]   DATE:  01/31/2007                       DOB:  09/04/1960   This lady was last seen here in February of this year to follow up  gastroesophageal reflux disease, constipation predominant IBS, history  of diverticulitis requiring sigmoid resection with subsequent takedown  by Dr. Elpidio Anis back in 2004.  Constipation has been fairly well  managed with Amitiza 24 mcg b.i.d., MiraLax 17 g orally b.i.d., daily  Citrucel fiber supplement.  This produces a bowel movement on the order  of 3 times weekly.  She has not had any melena or rectal bleeding.  She  had some left lower quadrant abdominal pain, for which she saw Dr.  Phillips Odor recently.  She denies having any CT, et Karie Soda.  He put her on  some Flagyl.  Her left lower quadrant abdominal pain is getting better.  She did have low grade fever with it.  I have not seen this patient  previously, but I have reviewed the record.  This lady, back in 2000,  was seen by Dr. Dionicia Abler for blood in the stool.  A colonoscopy was  attempted.  He could not get around.  A barium enema suggested a large  polyp in the left colon.  She did not return for followup attempt  colonoscopy under fluoroscopy.  She got sick in 2004 and had her sigmoid  out, although I do not have any operative notes, there are no further  mention of polyps in the chart.  In 2005, Dr. Dionicia Abler was able to do a  complete colonoscopy (status post sigmoid resection).  She had some  residual diverticula, but everything else looked okay.  The patient does  report that she had a polyp previously, but we just do not know the path  or have any information at this time.  No family history of first degree  relatives with colon cancer.  Again, she has not had any rectal  bleeding.  Her weight is up 8 pounds since February 2008.  In addition  to the above-mentioned regimen for constipation, she takes 2 Colace at  bedtime.  Reflux symptoms are  well controlled on omeprazole twice daily.   PAST MEDICAL HISTORY:  Depression, anxiety, neurosis, osteoarthritis,  Type 2 diabetes mellitus.   CURRENT MEDICATIONS:  1. Metformin 500 mg b.i.d.  2. Actos daily.  3. Xanax 1 mg daily.  4. Premarin 1.25 mg daily.  5. Stool softener 2 daily.  6. MiraLax 17 g b.i.d.  7. Citrucel 1 dose daily.  8. Darvocet p.r.n.  9. Seroquel 100 mg b.i.d.  10.Claritin daily.  11.Flax seed daily.   PAST SURGICAL HISTORY:  Tubal ligation, hysterectomy, sigmoid resection  with colostomy subsequently taken down.  Arthroscopic surgeries both  legs.  Heel spur surgery.   ALLERGIES:  Codeine.  Bactrim.   FAMILY HISTORY:  A great aunt may have had colon cancer.  No first  degree relatives with any gastrointestinal illness.   SOCIAL HISTORY:  The patient is married.  She is unemployed.  Does not  smoke.  Does not use alcohol.   REVIEW OF SYSTEMS:  No chest pain.  No dyspnea on exertion.  No fevers  or chills.  No odynophagia.  No dysphagia.  No early satiety.  No nausea  or  vomiting.  Reflux symptoms well controlled on omeprazole.  Otherwise,  as per history of present illness.   PHYSICAL EXAMINATION:  The patient is a pleasant 50 year old morbidly  obese lady resting comfortably.  Weight 285 pounds, height 5 feet 7 inches, temperature 97.7, BP 110/76,  pulse 80.  SKIN:  Warm and dry.  There is no jaundice suggesting chronic liver  disease.  HEENT:  No scleral icterus.  Conjunctivae pink.  Oral cavity with no  lesions.  CHEST:  Lungs are clear to auscultation.  CARDIAC:  Regular rate and rhythm without murmur, gallop, or rub.  BREASTS:  Deferred.  ABDOMEN:  Massively obese.  Well-healed surgical scars.  Positive bowel  sounds.  Soft and entirely nontender to palpation.  No obvious mass or  organomegaly.  EXTREMITIES:  No edema.  RECTAL:  Good sphincter tone.  No mass in the rectal vault.  No stool in  the rectal vault.  Mucus is Hemoccult  negative.   IMPRESSION:  The patient is a pleasant 50 year old lady with a history  of complicated diverticulitis requiring sigmoid resection and subsequent  colostomy with takedown.  She has chronic constipation being managed  fairly well with a fairly aggressive regimen of stool softeners,  polyethelene glycol, MiraLax, and Amitiza.  She had a recent bout of  left lower quadrant abdominal pain, which by my take, was fairly mild  and has responded at least temporarily to a course of Flagyl.  The real  question I have is whether or not this lady had adenomatous or higher  grade pathology polyp in the resected colon, performed by Dr. Katrinka Blazing back  in 2004.  If she did have a precancerous polyp, that would change the  algorithm for colonoscopy.  At this point in time, I do not necessarily  see any need for a repeat colonoscopy any sooner than 2015.  The patient  states that she has the pertinent records and will supply [place] with  copies.  For the time being, I have recommended that she continue  omeprazole 20 mg orally b.i.d. for reflux, continue Amitiza 24 mcg  b.i.d., MiraLax 17 g orally b.i.d., and daily fiber supplementation.  I  have encouraged exercise and weight loss.  Will plan to see this nice  lady  back in 1 month, touch base, see how things are going, and have the  additional records available for review.       Jonathon Bellows, M.D.  Electronically Signed     RMR/MEDQ  D:  01/31/2007  T:  02/01/2007  Job:  [id number]   cc:   Edsel Petrin, D.O.

## 2010-09-22 NOTE — Op Note (Signed)
[name], [name]             ACCOUNT NO.:  [id number]   MEDICAL RECORD NO.:  [id number]          PATIENT TYPE:  OIB   LOCATION:  1531                         FACILITY:  [facility]   PHYSICIAN:  Sandria Bales. Ezzard Standing, M.D.  DATE OF BIRTH:  1960-08-12   DATE OF PROCEDURE:  03/19/2008  DATE OF DISCHARGE:                               OPERATIVE REPORT   Date of surgery ??   PREOPERATIVE DIAGNOSIS:  Morbid obesity.  (Weight 288, BMI 45.32).   POSTOPERATIVE DIAGNOSIS:  Morbid obesity.  (Weight 288, BMI 45.3).   PROCEDURE:  Lap band, AP large (subcutaneous skin placement of the  reservoir lateral to the incision).   SURGEON:  Sandria Bales. Ezzard Standing, M.D.   FIRST ASSISTANT:  Thornton Park. Daphine Deutscher, MD   ANESTHESIA:  General endotracheal.   ESTIMATED BLOOD LOSS:  Minimal.   PROCEDURE:  [name] is a 50 year old white female patient of [name], who has been morbidly obese most of her adult life.  She has  been through our preop bariatric program and interested in lap band  placement.   The indications and potential complications of procedure were explained  to the patient.  Potential complications include, but are not limited  to, bleeding, infection, bowel injury, slippage of band, erosion of  band, and long-term nutritional consequences.   OPERATIVE NOTE:  The patient placed in the supine position, given a  general endotracheal anesthetic.  She had antibiotics in place, PAS  stockings in place and her abdomen is prepped with HCG.   A time-out was held identifying the patient and the procedure.  The  abdomen was accessed through the left upper quadrant with an 11-mm  OptiVu trocar.  I placed five additional trocars, a 5-mm subxiphoid  trocar for the [facility] retractor, a 15-mm right subcostal trocar, an  11 mm right paramedian trocar, an 11 mm left paramedian trocar.   She did have some adhesions from her prior surgery to the anterior  abdominal wall.  I spent about 15 minutes taking  down these adhesions  but these were all omentum which was taken down fairly easily off the  anterior abdominal wall.   I then placed the Nathanson retractor and lifted the left lobe of the  liver, dissected up to the gastroesophageal junction.   Along the angle of His, left side of the gastroesophageal junction, I  made an opening in the peritoneum and used a finger dissector to dissect  along and identify the left crus.   I then opened the gastrohepatic ligament.  I got into the area behind  the gastrohepatic ligament, identified the right crus.  I passed the  finger dissector in a retrogastric fashion.   I then passed the sizing tube down.  Because of tertiary contractions on  her upper GI I was a hesitant to try to close any of her diaphragmatic  hiatux, but she had no obvious hiatal hernia on our balloon pull back  test.  I then used the large lap band AP large because of her size and  amount of fat, I passed this through the Silastic tubing  around behind  the gastroesophageal junction.  I then had the sizing tube in place and  cinched this over the sizing tube which fit nicely.   I then imbricated stomach over the lap band using a 0 Ethibond suture. I  placed three sutures and tied these with the tie knot system.   The lap band lay well.  I took photos of this and these were placed in  the chart.  There was no bleeding, no evidence of any bowel injury.  I  then brought the tubing out through the right paramedian incision.   I then removed the Nathanson retractor under direct visualization and  all the trocars.  There was no bleeding at any trocar site.  Because the  patient is really more of an apple than a pear shape and the thickness  her abdominal wall, I decided to placed this port as a subcutaneous  port, so I created a pocket lateral to the incision and inserted this  port in.  It was palpable and lay correctly.   I then placed a 3-0 Vicryl suture around this port  trying to kind of fix  it a little bit.   I then closed the skin with a running 5-0 Vicryl at each suture site,  painted the wounds with tincture of benzoin and steri-stripped.   The patient tolerated the procedure well, was transported to recovery  room in good condition.  Sponge and needle counts were correct at end of  the case.      Sandria Bales. Ezzard Standing, M.D.  Electronically Signed     DHN/MEDQ  D:  03/19/2008  T:  03/20/2008  Job:  [id number]   cc:   Corrie Mckusick, M.D.  Fax: [phone]

## 2010-09-25 ENCOUNTER — Other Ambulatory Visit: Payer: Self-pay | Admitting: Obstetrics & Gynecology

## 2010-09-25 ENCOUNTER — Other Ambulatory Visit (HOSPITAL_COMMUNITY)
Admission: RE | Admit: 2010-09-25 | Discharge: 2010-09-25 | Disposition: A | Discharge status: Home / Self Care | Payer: Medicare Other | Source: Ambulatory Visit | Attending: Obstetrics & Gynecology | Admitting: Obstetrics & Gynecology

## 2010-09-25 DIAGNOSIS — Z01419 Encounter for gynecological examination (general) (routine) without abnormal findings: Secondary | ICD-10-CM | POA: Insufficient documentation

## 2010-09-25 NOTE — H&P (Signed)
[name], [name]             ACCOUNT NO.:  [id number]   MEDICAL RECORD NO.:  [id number]           PATIENT TYPE:  AMB   LOCATION:  DAY                           FACILITY:  [facility]   PHYSICIAN:  J. Darreld Mclean, M.D. DATE OF BIRTH:  09-08-1960   DATE OF ADMISSION:  DATE OF DISCHARGE:  LH                                HISTORY & PHYSICAL   CHIEF COMPLAINT:  Right knee pain.   The patient is a 50 year old female with pain and tenderness in her right  knee.  She has giving way of her knee and locking of her knee.  I saw her in  the office on August 6.  Complained of significant pain and tenderness and  gave much more pain in the knee.  I injected the knee with some Depo-Medrol,  got an MRI and MRI shows tear of the medial meniscus.  She has a positive  McMurray's.  Surgery is recommended.  She has had trouble with the right  knee on and off for a period of time.  She is status post left knee  arthroscopy.   PREVIOUS SURGERY:  1. Hysterectomy 2003.  2. Left knee surgery arthroscopy x2.  3. Patient has had significant diverticulitis which required colon      resection, osteotomy bag, and then reversal.  This was done initially      in November 2003 and subsequent surgery 2004.   ALLERGIES:  CODEINE.   PAST MEDICAL HISTORY:  Negative except for her knee pains and her colon  surgeries.   CURRENT MEDICATIONS:  1. Equetro 300 mg daily.  2. Metformin 500 mg b.i.d.  3. Premarin 1.25 mg daily.  4. Naprosyn 500 mg p.o. b.i.d.  5. Darvocet-N 100 for pain.   SOCIAL HISTORY:  Patient does not smoke.  She does not use any alcoholic  beverages.  She is a Engineer, agricultural.  She works at [employer] in  [place].  She is married.  Dr. Phillips Odor is her family doctor.   PHYSICAL EXAMINATION:  VITAL SIGNS:  Her vital signs are within normal  limits, 5 feet 7.  Weight was 240 pounds.  GENERAL:  She is alert, cooperative, oriented.  HEENT:  Negative.  NECK:  Supple.  LUNGS:  Clear to  P&A.  HEART:  Rate with __________ murmur heard.  ABDOMEN:  Soft, nontender, without masses.  EXTREMITIES:  Right knee has got pain and tenderness, particularly medially,  effusion, crepitus.  Positive McMurray medially.  Drawer sign negative.  Left knee has got mild effusion, crepitus, negative Lachman, negative  McMurray.  __________ intact.   IMPRESSION:  Tear of the medial meniscus right knee.   PERTINENT PLAN:  Operative arthroscopy of the knee.  Discussed with patient  planned procedure, risks, and imponderables.  Appears to understand the  procedure having undergone arthroscopy of the knee previously.  Laboratories  are pending.  ______________________________  Shela Commons. Darreld Mclean, M.D.     JWK/MEDQ  D:  12/20/2005  T:  12/20/2005  Job:  [id number]

## 2010-09-25 NOTE — H&P (Signed)
Samantha, [name] NO.:  [id number]   MEDICAL RECORD NO.:  [id number]                  PATIENT TYPE:   LOCATION:                                       FACILITY:   PHYSICIAN:  Lionel December, M.D.                 DATE OF BIRTH:  May 23, 1960   DATE OF ADMISSION:  DATE OF DISCHARGE:                                HISTORY & PHYSICAL   PRESENTING COMPLAINT:  Follow for lower abdominal pain and irregular bowel  movements.   HISTORY OF PRESENT ILLNESS:  Kileigh is a 50 year old Caucasian female who  was last seen on July 08, 2003, for recurrent lower-abdominal pain and  irregular bowel movements.  She has been experiencing these symptoms for the  last few months.  She also has had periods where she would be constipated,  and other times she would be having diarrhea.  Prior stool studies by Dr.  Sherwood [name] were all normal including a negative Clostridium difficile toxin  titer.  She was seen in the emergency room on two occasions, and she has an  abdominal pelvic CT in the Fall of last year which was reported to be  normal.  She was felt to have irritable bowel syndrome.  She was begun on  Citrucel 3-4 grams daily, and advised to take Colace as well as MiraLax if  she needed it.  She has a small bowel study which was within normal limits.  She was also advised to go on ferrous sulfate over-the-counter.  She states  she is feeling some better.  She is still having pain in her left lower-  quadrant.  She is now having bowel movements generally every other day.  Since her last visit, she has not had cycles of diarrhea and/or  constipation.  She still has bowel urgency.  She continues to feel weak and  tired.  She states she is having a touch time.  She is going through marital  problems and has a lot of stress in her job.  She was hospitalized at  [facility].  She is to follow with a Dr. Kathi Ludwig T. Arfeen  next week.  Her antidepressant is  changed.  She is starting to see some  improvement.   MEDICATIONS:  1. She is on Premarin 1.25 mg daily.  2. Xanax 0.5 mg daily.  3. Prevacid 30 mg t.i.d.  4. Stool softener q. daily.  5. Citrucel one tablespoon full daily.  6. Effexor 37.5 mg daily.  7. MiraLax 17 grams q.o.d.  or p.r.n.   PAST MEDICAL HISTORY:  She had tubal ligation in 1990.   She was evaluated by me in September of 2000 for abdominal pain and rectal  bleeding, and irregular bowel movements.  Colonoscopy was incomplete.  She  had diverticulitis of sigmoid colon.  She subsequently had a barium enema  which showed a filling defect in the mid transverse  colon.  However, she did  not return for colonoscopy.  She was treated for diverticulitis by Dr. Katrinka [name]  in November of 2003.  She had sigmoid colon resection, and in March of last  year, she had takedown of her sigmoid colostomy.  Prior to that surgery, she  had a hysterectomy in September of 2003 by Dr. Despina Hidden.  He noted extensive  adhesions between her colon and adnexa.   She has a history of gastroesophageal reflux disease, anxiety, neurosis, and  depression.  She also has obesity.   FAMILY HISTORY:  Negative for colon carcinoma in first-degree relative, but  positive in a second-degree relative who died in her 77's.   ALLERGIES:  Codeine.   PHYSICAL EXAMINATION:  VITAL SIGNS:  Weight 299 pounds.  She is 5 feet, 7  inches tall.  Pulse 92 per minute.  Blood pressure 122/74.  HEENT:  Conjunctivae is pink.  Sclerae is nonicteric.  NECK:  No neck masses are noted.  HEART/LUNG:  Examination is within normal limits.  ABDOMEN:  Obese.  Bowel sounds are normal.  She has mild tenderness at left  lower quadrant.  No organomegaly or masses.  RECTAL:  Examination is deferred.  EXTREMITIES:  She does not have peripheral edema.   ASSESSMENT:  Lumi's symptoms are suggestive of irritable bowel syndrome.  She has improved somewhat with therapy.  She remains with pain and  bowel  urgency.  I have reviewed all records.  She has never had a total  colonoscopy.  She did have one attempted by Dr. Katrinka [name] prior to her sigmoid  colon resection, and this was also incomplete because of diverticulitis and  stricture.  I feel her colon needs to be surveyed to make sure that it is  normal and also to make sure she does not have anastomotic stricture.   PLAN:  1. She will continue present therapy.  2. Colonoscopy to be performed at [facility] in near future.  I     have reviewed the procedure and risks with the patient, and she is     agreeable.     ___________________________________________                                         Lionel December, M.D.   NR/MEDQ  D:  08/20/2003  T:  08/21/2003  Job:  [id number]   cc:   [name], M.D.  [address]  [place]  [place] [address]  Fax: [phone]   [name], M.D.  [address]  [place]  [place] [address]  Fax: [phone]

## 2010-09-25 NOTE — H&P (Signed)
   [name], [name]                       ACCOUNT NO.:  [id number]   MEDICAL RECORD NO.:  [id number]                   PATIENT TYPE:  AMB   LOCATION:  DAY                                  FACILITY:  [facility]   PHYSICIAN:  Jerolyn Shin C. Katrinka Blazing, M.D.                DATE OF BIRTH:  [birth year]   DATE OF ADMISSION:  07/10/2002  DATE OF DISCHARGE:                                HISTORY & PHYSICAL   HISTORY OF PRESENT ILLNESS:  [age]-year-old female who is status post  sigmoid colectomy, left salpingo-oophorectomy on March 30, 2002 for  extensive acute diverticulitis.  The patient had sigmoid resection with end  colostomy and Hartmann's pouch.  She has done well in the interim.  She  received oral antibiotics for about one month.  She continues to do well.  She is felt to be adequately recovered to have colostomy closure.  This is  scheduled for July 10, 2002.   PRESENT MEDICATIONS:  1. Lexapro 20 mg daily.  2. Prevacid 30 mg b.i.d.  3. Ativan 1 mg t.i.d.  4. Tylox one p.r.n.   PAST SURGICAL HISTORY:  Supracervical hysterectomy in September of 2003,  sigmoid colectomy with Hartmann's procedure in November 2003, left salpingo-  oophorectomy, November 2003.   PAST SURGICAL HISTORY:  Other medical problems include hypertension.   ALLERGIES:  PROLENE.   PHYSICAL EXAMINATION:  VITAL SIGNS:  On examination, blood pressure 140/90,  pulse 80, respirations 18, weight 267 pounds.  HEENT:  Unremarkable.  NECK:  Neck supple without JVD or bruit.  CHEST:  Chest clear to auscultation.  HEART:  Regular rate and rhythm without murmur, gallop or rub.  ABDOMEN:  Abdomen obese, soft and nontender.  Well-healed incision.  Functioning, well-formed stoma, left lower quadrant.  Good active bowel  sounds.  EXTREMITIES:  No cyanosis, clubbing or edema.  NEUROLOGIC:  Exam nonfocal.   IMPRESSION:  1. Diverticulitis, status post sigmoid resection with Hartmann's procedure.  2. Hypertension.  3.  Anxiety disorder.  4. Morbid obesity.   PLAN:  Colostomy closure.                                               Dirk Dress. Katrinka Blazing, M.D.    LCS/MEDQ  D:  07/09/2002  T:  07/10/2002  Job:  [id number]

## 2010-09-25 NOTE — Op Note (Signed)
NAMETU, SHIMMEL                       ACCOUNT NO.:  [id number]   MEDICAL RECORD NO.:  [id number]                   PATIENT TYPE:  AMB   LOCATION:  DAY                                  FACILITY:  [facility]   PHYSICIAN:  Lionel December, M.D.                 DATE OF BIRTH:  [birth year]   DATE OF PROCEDURE:  DATE OF DISCHARGE:                                 OPERATIVE REPORT   PROCEDURE:  Total colonoscopy.   ENDOSCOPIST:  Lionel December, M.D.   INDICATIONS:  [name] is a 50 year old Caucasian female with recurrent lower  abdominal pain and irregular bowel movements.  She is felt to have IBS but  has not responded to therapy.  She has history of complicated diverticulitis  for which she had sigmoid colon resection.  She had colostomy which was  subsequently taken down.  She has had 2 previous attempts at colonoscopy  both of which were prior to her surgery and they were incomplete and felt to  be due to adhesions.  She is undergoing colonoscopy, now, to make sure that  anastomosis is wide open and she does not have any other lesions.  The  procedure and risks were reviewed with the patient and informed consent was  obtained.   PREOPERATIVE MEDICATIONS:  Demerol 50 mg IV and Versed 5 mg IV in divided  dose.   FINDINGS:  Procedure performed in endoscopy suite.  The patient's vital  signs and O2 saturation were monitored during the procedure and remained  stable.  The patient was placed in the left lateral recumbent position and  rectal examination was performed.  No abnormality noted on external or  digital exam.   Olympus videoscope was placed in the rectum and advanced under vision into  the sigmoid colon.  Anastomosis was located at 23 cm from the anal margin  and was wide, marked by a small suture granuloma and Prolene sutures were  also noted.  The scope was passed further.  There were a few small  diverticula mainly in her left colon, but there were a few more at  transverse  colon and 1 at cecum.  Cecal landmarks were well seen.  Picture  of the ileocecal valve and appendiceal orifice/stump was taken for the  record. As the scope was withdrawn the colonic mucosa was once again  examined carefully and there were no polyps and/or tumor masses.  Rectal  mucosa similarly was normal..   The scope was retroflexed to examine the anorectal junction and small were  noted below the dentate line.  The endoscope was straightened and withdrawn.  The patient tolerated the procedure well.   FINAL DIAGNOSES:  1. Wide open colonic anastomosis.  2. Scattered diverticula noted throughout colon; these are few in number and     small in size.  3. Small external hemorrhoids.   RECOMMENDATIONS:  1. She will continue high fiber diet, Citrucel and Colace  as before and use     MiraLax on demand.  2. She will return for OV in 3 months.      ___________________________________________                                            Lionel December, M.D.   NR/MEDQ  D:  08/27/2003  T:  08/28/2003  Job:  [id number]   cc:   Madelin Rear. Sherwood Gambler, M.D.  [address]  [place]  [place] [address]  Fax: [phone]   Dirk Dress. Katrinka Blazing, M.D.  [address]  [place]  [place] [address]  Fax: [phone]

## 2010-09-25 NOTE — Group Therapy Note (Signed)
   [name], [name]                       ACCOUNT NO.:  [id number]   MEDICAL RECORD NO.:  [id number]                   PATIENT TYPE:  INP   LOCATION:  A322                                 FACILITY:  [facility]   PHYSICIAN:  Lazaro Arms, M.D.                DATE OF BIRTH:  October 14, 1960   DATE OF PROCEDURE:  02/08/2002  DATE OF DISCHARGE:                                   PROGRESS NOTE   SUBJECTIVE:  The patient feels good.  She has been up and ambulating,  sitting in a chair without symptoms.  She is passing gas and really her pain  now is more of a soreness rather than acute pain.   OBJECTIVE:  T-max is 100.1, T current is 99.1; pulse is 98; respirations are  20; and blood pressure is 112/63.  Urine output is adequate and clear.   Abdomen is soft, nontender, appropriate postop.  The incision has some  redness and warmth around it, consistent with an early cellulitis; she will  be continued on her Levaquin for that.  Otherwise, there is no evidence of a  hematoma or abscess formation.  Extremities are warm with no edema.   ASSESSMENT:  1. Postoperative day #2, supracervical hysterectomy with right salpingo-     oophorectomy.  2. Rectosigmoid mass.   PLAN:  1. I will continue the patient on Levaquin 500 mg daily.  She will continue     her postoperative course that has been mapped out at present.  2. We will order a CT scan of the chest, abdomen and pelvis tomorrow.                                               Lazaro Arms, M.D.    [name]  D:  02/08/2002  T:  02/08/2002  Job:  [id number]

## 2010-09-25 NOTE — Discharge Summary (Signed)
[name], PELLEGRINO                       ACCOUNT NO.:  [id number]   MEDICAL RECORD NO.:  [id number]                   PATIENT TYPE:  INP   LOCATION:  A207                                 FACILITY:  [facility]   PHYSICIAN:  Dirk Dress. Katrinka Blazing, M.D.                DATE OF BIRTH:  December 15, 1960   DATE OF ADMISSION:  07/10/2002  DATE OF DISCHARGE:  07/17/2002                                 DISCHARGE SUMMARY   Dictation previously dictated on March 31.  Work type [id number].   DISCHARGE DIAGNOSES:  1. Diverticulosis, status post sigmoid resection __________.  2. Hypertension.  3. Perioperative hypoxemia due to basilar atelectasis.  4. Anxiety disorder.  5. Obesity.   SPECIAL PROCEDURE:  Colostomy closure with incidental appendectomy.   DISPOSITION:  The patient is discharged home in stable and satisfactory  condition.   DISCHARGE MEDICATIONS:  1. Tylox two every four hours as needed for pain.  2. Lexapro 10 mg q.d.  3. Premarin 0.25 mg q.d.  4. Restoril 30 mg q.h.s.  5. Ativan t.i.d.  6. Prevacid 30 mg q.d.   Patient scheduled to be seen in the office on March 17.   SUMMARY:  A 50 year old female status post sigmoid colon resection with left  salpingo-oophorectomy March 30, 2002, for extensive acute diverticulitis.  She had a sigmoid resection with colostomy __________ and did well in the  interim.  She received antibiotics __________.  She is recovering and is now  returning to have colostomy closure.   PAST MEDICAL HISTORY:  __________.   SUMMARY:  Patient was admitted on the day of surgery __________  colostomy  closure with incidental appendectomy.  She had some episodes of shortness of  breath and nausea in the post-op area.  Chest x-ray showed baseline  atelectasis.  On March 4 it was noted that her O2 saturation was down to 60%  on room air.  She was having some anterior chest pain.  She was transferred  to the ICU to be monitored.  The following day she felt better.  She  had  some soreness in her anterior chest.  Oxygen saturation 98% on 2 liters of  oxygen.  Breath sounds were clear except for bibasilar rales.  Spiral CT was  negative for pulmonary embolus.  Patient was placed on __________.  She had  no further respiratory difficulty.  Oxygen saturation improved.  White count  stayed in normal range.  Nasogastric tube was discontinued on March 6.  She was transferred back to  the [facility].  She had regular bowel movements by March 7.  She had no problems  from that point.  She was discharged in stable satisfactory condition on  March 9.  Dirk Dress. Katrinka Blazing, M.D.    LCS/MEDQ  D:  10/13/2002  T:  10/13/2002  Job:  [id number]

## 2010-09-25 NOTE — Op Note (Signed)
NAMECURLEY, [name]                       ACCOUNT NO.:  [id number]   MEDICAL RECORD NO.:  [id number]                   PATIENT TYPE:  AMB   LOCATION:  DAY                                  FACILITY:  [facility]   PHYSICIAN:  Jerolyn Shin C. Katrinka Blazing, M.D.                DATE OF BIRTH:  October 21, 1960   DATE OF PROCEDURE:  07/10/2002  DATE OF DISCHARGE:                                 OPERATIVE REPORT   PREOPERATIVE DIAGNOSIS:  Diverticulitis, status post Hartmann's procedure.   POSTOPERATIVE DIAGNOSIS:  Diverticulitis, status post Hartmann's procedure.   PROCEDURES:  1. Colostomy closure.  2. Incidental appendectomy.   SURGEON:  Dirk Dress. Katrinka Blazing, M.D.   DESCRIPTION OF PROCEDURE:  Under general anesthesia, the patient's abdomen  was prepped and draped in a sterile field.  The stoma was sutured shut with  running locking 2-0 silk.  It was covered with a drape.  A midline incision  was made.  There were extensive adhesions from previous severe inflammation.  These adhesions were mostly between the abdominal wall and the omentum.  The  omentum was separated.  Access to the deep pelvis was obtained.  The  appendix was swollen and chronically inflamed.  It was not known that this  was due to subacute appendicitis or due to the inflammation since the tip of  the appendix was densely adherent to the apex of the rectal stump.  This was  dissected free, nevertheless, and the mesoappendix was clamped, divided,  controlled with ligatures of 2-0 silk.  The base of the appendix was  transected using a TA-30B stapler.  Next the rectal stump was dissected.  The old sutures were isolated.  They were followed to the apex of the  residual rectum.  Once the rectal wall was identified, the distal tip was  transected until the lumen was encountered.  Further dissection anterior and  posteriorly was carried out.  Hemostasis was adequate and the vascular  supply was very good.  Next the distal colon leading tip of  stoma was  dissected.  It was transected using a TA-55 stapler.  The descending colon  was further mobilized laterally.  It was dissected and it was decided to do  a hand-sewn anastomosis.  The end of the colon was oversewn with 3-0  Prolene.  Incision was made along the taenia.  An end-to-side anastomosis  between the rectal stump and the distal colon was carried out using outer  layer of running 3-0 Prolene, inner layer of 2-0 Biosyn, and an outer layer  of 3-0 Prolene.  Anastomosis was felt to be adequate.  Irrigation was  carried out.  There was some dissection along the deep pelvis anteriorly,  and the large uterocervical stump was identified.  It did not appear to have  any significant involvement except for inflammation.  This was investigated  at the request of the patient.  Further irrigation of the pelvis  was carried  out.  Inspection of the upper abdomen revealed no pathology by palpation.  The nasogastric tube was positioned in the stomach.  There was no mesenteric  defect.  Next the skin over the stoma was incised, and the residual colon  was dissected from the very thick abdominal wall.  Once this was done, the  area was irrigated with copious amounts of saline and then saline mixed with  Betadine.  The peritoneum and fascial layer were closed from the peritoneal  side using 0 Biosyn.  The muscle and fascia from the outside were closed  with running 0 Prolene.  Subcutaneous tissue was irrigated and a JP drain  was placed in the depth of the wound simply because her abdominal wall was  extremely thick.  The fat was then closed in multiple layers using 2-0  Biosyn.  Next sponge, needle, instrument, and blade counts were verified as  correct x3.  The area of the stoma was drained with a JP drain  intraperitoneally and another drain was placed in the deep pelvis anterior  to the anastomosis.  These were brought out through separate incisions in  the right lower quadrant.  The  stump of the cecum was inspected and appeared  to be uninvolved.  The fascia was closed using running #1 Prolene.  Subcutaneous tissue was closed in three layers using interrupted and running  2-0 Biosyn.  Skin of both incisions was closed with staples.  JP drains were  secured with 3-0 nylon.  The patient tolerated the procedure well.  She was  then awakened from anesthesia uneventfully.  A sterile dressing was placed.  She was transferred to a bed and taken to the postanesthetic care unit for  monitoring.                                                Dirk Dress. Katrinka Blazing, M.D.    LCS/MEDQ  D:  07/10/2002  T:  07/10/2002  Job:  [id number]   cc:   Lazaro Arms, M.D.  [address], [address]  [place]  [place] [address]  Fax: [phone]

## 2010-09-25 NOTE — Group Therapy Note (Signed)
   [name], [name]                       ACCOUNT NO.:  [id number]   MEDICAL RECORD NO.:  [id number]                   PATIENT TYPE:  INP   LOCATION:  A322                                 FACILITY:  [facility]   PHYSICIAN:  Lazaro Arms, M.D.                DATE OF BIRTH:  [birth year]   DATE OF PROCEDURE:  02/07/2002  DATE OF DISCHARGE:                                   PROGRESS NOTE   TIME:  8:30.   Postop day #1 of a supracervical hysterectomy with right salpingo-  oophorectomy.   SUBJECTIVE:  The patient is awake, alert, feels pretty good.  She is not  using much Dilaudid at all.  She is not nauseated and tolerating her clear  liquids.  She has been out of bed and has been asymptomatic.   OBJECTIVE:  T-max is 100.0, pulse is 88.  Respirations are 20, blood  pressure is 105/54.  Her urine output has been excellent and clear.   Her abdominal exam is benign.  She has good bowel sounds.  The abdomen is  soft, nondistended.  Incision is clean, dry, and intact and appropriate.   Hemoglobin this morning is 10.2, hematocrit is 30.8, white count is 11.5,  platelet count is 271,000.  There are only 66 neutrophils.   ASSESSMENT:  1. Postoperative day #1 of a supracervical hysterectomy with right salpingo-     oophorectomy.  2. 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   PLAN:  1. PCA pump will be discontinued this morning and she will be placed on oral     pain medicine.  The IV Toradol will be switched to p.o. Toradol.  2. I will liberalize her diet.  3. A CT scan of the chest, abdomen, and pelvis is scheduled for Friday for     evaluation of probable malignancy.  4. Dr. Katrinka Blazing, of course, was involved yesterday and will continue to be     involved and evaluate her down the road for this problem.  5. Otherwise routine postoperative care.                                               Lazaro Arms, M.D.    [name]  D:  02/07/2002  T:  02/07/2002  Job:  [id number]

## 2010-09-25 NOTE — H&P (Signed)
NAMEMARIN, [name]                       ACCOUNT NO.:  [id number]   MEDICAL RECORD NO.:  [id number]                   PATIENT TYPE:  AMB   LOCATION:  DAY                                  FACILITY:  [facility]   PHYSICIAN:  Lazaro Arms, M.D.                DATE OF BIRTH:  January 18, 1961   DATE OF ADMISSION:  DATE OF DISCHARGE:                                HISTORY & PHYSICAL   HISTORY OF PRESENT ILLNESS:  The patient is admitted, who is a 50 year old  white female gravida 2, para 2, status post tubal ligation, for a TAH and  RSO.  The patient came to see [place] in August and was having basically five  weeks of continuous bleeding with large clotting.  Her hemoglobin was 11.3.  Normally she has pretty regular periods, but they are quite heavy.  She goes  through, sometimes, eight pairs of panties in a day and she changes pads and  tampons every hour.  She has chicken liver size clots and soils her clothes  and sheets.  She has a history of a similar episode in the past, which she  bled that long, and that was in 2000.  She also has right lower quadrant  pain and has that with intercourse as well.   PAST MEDICAL HISTORY:  1. Menstrual migraines.  2. Seasonal allergies.  3. Psoriasis.  4. Chronic sinus infections; she had been on antibiotics for five to six     weeks because of chronic sinus infections in the past.   PAST SURGICAL HISTORY:  1. Tubal ligation in 1999.  2. Heel spur surgery, 2001.   PAST OBSTETRICAL HISTORY:  She has had two vaginal deliveries; 9 pounds 2  ounces and 7 pounds 8 ounces.   PAST SURGICAL HISTORY:  She does not smoke.  She does not do drugs or  alcohol.  She is a Production designer, theatre/television/film at [employer].   REVIEW OF SYSTEMS:  Otherwise negative.   MEDICATIONS:  Megace, which I out her on for the bleeding and also Hemocyte.   PHYSICAL EXAMINATION:  VITAL SIGNS:  Weight 282 pounds.  Blood pressure is  140/80.  HEENT:  Unremarkable.  NECK:  Thyroid is normal.  LUNGS:   Clear.  HEART:  Regular rate and rhythm without murmurs, rubs or gallops.  BREASTS:  Without mass, discharge or skin changes.  ABDOMEN:  Benign.  No hepatosplenomegaly or masses.  GENITALIA:  She has normal external genitalia.  Vagina is pink and moist  without discharge.  Her uterus is very high, not distended at all.  The  adnexa is negative.  The uterus feels soft and marginally enlarged  consistent with adenomyosis.  NEUROLOGIC EXAMINATION:  Grossly intact.   IMPRESSION:  1. Menometrorrhagia.  2. Anemia.  3. Dysmenorrhea.   The patient responded to Megace 40 mg, but does not want to have  conservative therapy nor ablation.  She also  does not want to use birth  control pills; as a result she is admitted for TAH and RSO.  She understands  the risks, benefits, indications and alternatives, and is willing to  proceed.                                                   Lazaro Arms, M.D.    [name]  D:  02/05/2002  T:  02/06/2002  Job:  [id number]

## 2010-09-25 NOTE — Op Note (Signed)
NAMEBERTA, DENSON             ACCOUNT NO.:  [id number]   MEDICAL RECORD NO.:  [id number]          PATIENT TYPE:  AMB   LOCATION:  DAY                           FACILITY:  [facility]   PHYSICIAN:  J. Darreld Mclean, M.D. DATE OF BIRTH:  1960-08-23   DATE OF PROCEDURE:  12/21/2005  DATE OF DISCHARGE:                                 OPERATIVE REPORT   PREOPERATIVE DIAGNOSIS:  Tear of the medial meniscus, right knee.   POSTOPERATIVE DIAGNOSIS:  Tear of the medial meniscus, right knee.   OPERATION/PROCEDURE:  1. Operative arthroscopy of the right knee.  2. Partial medial meniscectomy.   ANESTHESIA:  General.   SURGEON:  J. Darreld Mclean, M.D.   TOURNIQUET TIME:  17 minutes.   DRAINS:  None.   INDICATIONS:  The patient is a 50 year old female with pain and tenderness  in it right knee giving away and locking.  MRI shows tear of the medical  meniscus of the right knee.  It is not improved with conservative treatment.  Surgery is recommended.  Risks and imponderables of the procedure are  discussed preoperatively.  She understood.  She asked appropriate questions.   DESCRIPTION OF PROCEDURE:  Seen in the holding area.  The right knee is  identified as the correct surgical site.  She placed a mark on the right  knee.  I placed a mark on the right knee.  She was brought back to the  operating room, placed supine on the operating room table.  General  anesthesia was obtained.  Tourniquet and leg holder placed.  Deflated upper  thigh. She was prepped and draped in the usual manner.  A time out to  identify Mrs. Aries as the patient and the right knee as the correct  surgical site.  The limb was elevated, wrapped circumferentially with  Esmarch bandage.  Inflow cannula was inserted medically after the tourniquet  had been inflated to 350 mmHg, Esmarch bandage was removed.  Arthroscope was  inserted laterally and the knee systematically examined.   The patella pouch had some changes  on the surface of the patella, grade 2 to  grade 3, some synovitis.  Medially there was a tear in the posterior horn  and medial meniscus with degenerative joint disease of the knee, grade 2  medially.  Anterior cruciate was intact.  Laterally the knee looked good  with grade 2 changes and no tear of the meniscus laterally.  No loose  bodies.   Attention was directed back to the medial side.  She had some early grade 3  changes as well. She had a tear in the posterior horn using a punch and  meniscal shaver and then a laser.  Good smooth contour was obtained.  He was  systematically examined and no new pathology found.  __________ pictures  were taken.  The knee was irrigated __________ lactated Ringer's.  The  wounds reapproximated using 3-0 nylon interrupted vertical mattress manner.  Tourniquet was deflated after 17 minutes.  Sterile dressing applied. Bulky  dressing applied.  The patient tolerated the procedure well and will go to  recovery  in good condition.   I already gave her Darvocet-N 100 yesterday for pain.  She is set up for  physical therapy.  For difficulties, she is to contact me through the office  or the hospital beeper system.  I will see her in the office in  approximately 10 days to two weeks.  Call if any problem.           ______________________________  Shela Commons. Darreld Mclean, M.D.     JWK/MEDQ  D:  12/21/2005  T:  12/21/2005  Job:  [id number]

## 2010-09-25 NOTE — Op Note (Signed)
NAMEJERENE, [name]                       ACCOUNT NO.:  [id number]   MEDICAL RECORD NO.:  [id number]                   PATIENT TYPE:  INP   LOCATION:  A303                                 FACILITY:  [facility]   PHYSICIAN:  Dirk Dress. Katrinka Blazing, M.D.                DATE OF BIRTH:  [birth year]   DATE OF PROCEDURE:  03/30/2002  DATE OF DISCHARGE:                                 OPERATIVE REPORT   PREOPERATIVE DIAGNOSIS:  Acute diverticulitis.   POSTOPERATIVE DIAGNOSIS:  Acute diverticulitis.   PROCEDURES:  1. Sigmoid colon resection with end-colostomy and Hartmann's pouch.  2. Left salpingo-oophorectomy.  3. Left central venous catheter insertion.   SURGEON:  Dirk Dress. Katrinka Blazing, M.D.   DESCRIPTION OF PROCEDURE:  Under general anesthesia, the patient's abdomen  was prepped and draped in a sterile field.  A midline incision was made and  extended above the umbilicus.  Upon entering the abdomen, there was no free  peritoneal fluid.  The omentum was densely adherent to a large inflammatory  mass in the pelvis.  The omentum was freed up using blunt dissection.  Once  this was done, mass could be evaluated.  It involved the greater portion of  the sigmoid colon extending up to about mid-left colon.  It also extended  into the deep pelvis and into the posterior wall of the bladder.  The left  ovary and tube were densely involved in the inflammatory process.  The  abdomen was packed off so that full view of the pelvis could be obtained.  Using blunt dissection the mass was separated from the posterior wall of the  bladder.  The bladder blade was then placed.  Next, using blunt dissection  the mid- and distal sigmoid was dissected away from surrounding structures.  The left ovary was severely inflamed, and the vessels to the ovary were  friable and showed some evidence of bleeding.  Suture ligatures were used to  control the bleeding initially, and the area was packed off.  The mesentery  to the  sigmoid was scored and was divided close to the bowel, since this  appeared to be a primary inflammatory process.  Vessels were doubly clamped  with Kelly clamps and divided.  This was continued to the mid-descending  colon.  At this level the sigmoid was divided using a GIA stapler.  The  distal segment was then dissected down into the deep pelvis.  The peritoneum  was incised and evaluation in the retroperitoneum revealed that the  inflammatory process had extended into the retroperitoneum.  Since it was  diverticulitis, it was felt that we did not have to sacrifice this bowel.  Further dissection of the vascular pedicle to the mid- and distal sigmoid  down to the peritoneal reflection was carried out.  Vessels were tied with  double ligatures of 2-0 silk.  Using a reticulating TA-60 stapler, the  distal sigmoid was divided  immediately above the peritoneal reflection.  The  specimen was passed off and was then opened.  There was some mucus in the  lumen but there was no evidence of mucosal disease, and all of the  inflammatory process was in the wall of the bowel and in the pericolic fat.  The distal stump was oversewn using 3-0 Prolene.  Three suture ends were  left long so as to be able to identify this on follow-up operation.  Copious  irrigation was carried out.  Inspection of the upper abdomen revealed the  liver to be normal.  The gallbladder was distended but otherwise was  unremarkable.  The nasogastric tube was positioned in the stomach.  The  colon had a significant amount of soft stool but otherwise was unremarkable.  The small bowel was uninvolved except for a small segment that was attached  to the posterior wall of the bladder.  Once this was done, an opening was  made in the anterior abdominal wall about half the distance between the  umbilicus and the anterior superior iliac spine.  Skin was excised and the  subcutaneous tissue and muscle were incised.  An opening  comprising about  three fingerbreadths was used because of the thickness of the mesentery.  The end of the descending colon was brought out through this incision, and  this was to be the colostomy.  Further irrigation was carried out.  Two JP  drains were brought out through separate incisions in the right lower  quadrant, and the drains were placed in the deep pelvis and in the left  gutter.  While inspecting the tubo-ovarian complex, it was felt that it was  having significant bleeding.  I used about five more sutures of Prolene to  try to control the bleeding but by this time the vascular pedicle was  compromised, and the pedicle was dissected, clamped with a Kelly clamp, and  divided, and the tube and ovary were excised because leaving it would lead  to a high potential for continued bleeding and the need for reoperation.  These areas were inspected.  The colon was sutured to the peritoneum using  interrupted 3-0 silk.  Sponge, needle, instrument, and blade counts were  verified as correct.  The omentum was placed down in the pelvis and over the  small bowel.  The peritoneum was closed using running 0 Prolene.  The  subcutaneous tissue was irrigated with copious amounts of saline and was  closed in two layers with 2-0 Biosyn.  The skin was closed with staples.  The wall of the bowel was sutured to the subcutaneous tissue with 3-0 silk.  The thickness of the abdominal wall was such that I was unable to get down  to the abdominal wall to suture the wall of the colon to the fascia.  Since  this is a temporary colostomy, it was felt that this was not necessary.  The  skin was closed with staples and the wound was covered with OpSite dressing.  Next the stoma was opened and the full thickness of the end of the bowel was  sutured to the dermis circumferentially using 4-0 Dexon.  A colostomy  appliance was placed.  The stoma looked very red and quite viable.  Once this was done, I broke scrub  and prepared to insert a left subclavian  catheter.   The patient was repositioned.  The left neck was prepped and draped with  Betadine.  She was still under anesthesia, so  no local infiltration was  carried out.  The needle was inserted in the outer third of the clavicle in  the subclavicular area.  The subclavian vein was accessed and a guidewire  was placed without difficulty.  A small incision was made along the  guidewire and the dilator was placed over the guidewire.  The catheter was  then positioned over the guidewire, and the guidewire was removed  uneventfully.  The catheter was sutured to the skin with interrupted silk.  OpSite dressing was placed.  The patient tolerated the procedure well.  The  catheter was flushed with heparinized saline.  The patient was awakened  without difficulty, transferred to a bed, and taken to the postanesthetic  care unit for further monitoring.                                                Dirk Dress. Katrinka Blazing, M.D.    LCS/MEDQ  D:  03/30/2002  T:  03/30/2002  Job:  [id number]   cc:   Lazaro Arms, M.D.  [address], [address]  [place]  [place] [address]  Fax: [phone]
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
## 2010-09-25 NOTE — Op Note (Signed)
NAMEANISSIA, WESSELLS             ACCOUNT NO.:  [id number]   MEDICAL RECORD NO.:  [id number]          PATIENT TYPE:  AMB   LOCATION:  DAY                           FACILITY:  [facility]   PHYSICIAN:  J. Darreld Mclean, M.D. DATE OF BIRTH:  1960/12/25   DATE OF PROCEDURE:  02/04/2004  DATE OF DISCHARGE:                                 OPERATIVE REPORT   PREOPERATIVE DIAGNOSIS:  Tear of medial meniscus, left knee.   POSTOPERATIVE DIAGNOSIS:  Tear of medial meniscus, left knee.   PROCEDURE:  Operative arthroscopy of the left knee, partial medial  meniscectomy.   ANESTHESIA:  General.   SURGEON:  J. Darreld Mclean, M.D.   TOURNIQUET TIME:  19 minutes.   DRAINS:  None.   INDICATIONS FOR PROCEDURE:  The patient is a 50 year old female with pain  and tenderness in the left knee.  MRI shows a tear of the posterior horn of  the medial meniscus.  We have tried conservative treatment without success.  Surgery is recommended.  The risks and imponderables of the procedure have  been discussed with the patient preoperatively.  She appears to understand  and agrees to the procedure as outlined.   FINDINGS:  The suprapatellar pouch had mild synovitis.  There was DJD on the  undersurface of the patella.  There were grade 2 to 3 changes in the distal  femoral condyle and grade 2 changes on the tibial plateau medially.  There  was a tear of the posterior horn of the medial meniscus.  The anterior  cruciate was intact.  Laterally, there were grade 2 changes with some very  slight flaying of the lateral meniscus.  No tear.  No loose bodies.   DESCRIPTION OF PROCEDURE:  The patient was seen in the holding area, and the  left knee was identified as the correct surgical site.  I placed a mark on  the knee and so did the patient.  The patient was brought to the operating  room and given general anesthesia.  She was placed supine.  Tourniquet and  leg holder placed deflated on the left upper thigh.  We  again reidentified  that the left knee was the correct surgical site.  The patient was prepped  and draped in the usual manner.  The leg was elevated and wrapped  circumferentially with an Esmarch bandage.  The tourniquet was inflated to  300 mmHg.  The Esmarch bandage was removed.  Inflow cannula inserted and  lactated Ringer's instilled into the knee by an infusion pump.   The arthroscope was inserted laterally and the knee systematically examined.  Please see findings above.  Pertinent pictures were taken.  Using a meniscal  punch and meniscal shaver, the posterior horn of the medial meniscus was  removed, and a good smooth contour was obtained.  The knee was  systematically reexamined, and no new pathology was found.  The wound was  reapproximated with 3-0 nylon interrupted vertical mattress manner.  The  tourniquet was deflated after 19 minutes.  Marcaine 0.25% was instilled into  each portal.  A sterile dressing was applied.  A bulky dressing was applied.   The patient tolerated the procedure well.  A prescription for Darvocet-N 100  was given for pain.  I will see her in the office in approximately 10 days  to 2 weeks.  Physical therapy has been arranged.  For any difficulties, she  is to contact me through the office or hospital beeper system.  Numbers have  been provided.      JWK/MEDQ  D:  02/04/2004  T:  02/04/2004  Job:  [id number]

## 2010-09-25 NOTE — Procedures (Signed)
   NAMELUCIANNE, SMESTAD                       ACCOUNT NO.:  [id number]   MEDICAL RECORD NO.:  [id number]                   PATIENT TYPE:  INP   LOCATION:  IC04                                 FACILITY:  [facility]   PHYSICIAN:  Vida Roller, M.D.                DATE OF BIRTH:  [birth year]   DATE OF PROCEDURE:  07/13/2002  DATE OF DISCHARGE:                                  ECHOCARDIOGRAM   TAPE NUMBER:  [id number]   TAPE COUNT:  [id number]   INDICATIONS FOR PROCEDURE:  Chest discomfort and shortness of breath.   CLINICAL INFORMATION:  Technical quality of the study is technically limited  due to the patient's positioning and acoustic windows.  Location of the  study is ICU.   PREVIOUS STUDY:  @@   M-MODE:  AORTA:  28 mm  (<4.0)  LEFT ATRIUM:  43 mm which is enlarged (<4.0)  SEPTUM:  15 mm which is enlarged  (0.7-1.1)  POSTERIOR WALL:  13 mm which is enlarged  (0.7-1.1)  LV-DIASTOLE:  36 mm  (<5.7)  LV-SYSTOLE:  28 mm  (<4.0)  E-SEPTAL:  @@  (<0.5)  RV-DIASTOLE:  @@  (<2.5)  IVC:  @@  (<2.0)   TWO-DIMENSIONAL AND DOPPLER IMAGING:  The left ventricle appears to have  normal systolic function.  Diastolic function and wall motion assessment  were beyond the limits of the study.  The right ventricle was not well seen.  The remainder of the echocardiographic images were limited at best.                                               Vida Roller, M.D.    JH/MEDQ  D:  07/13/2002  T:  07/14/2002  Job:  [id number]

## 2010-09-25 NOTE — Discharge Summary (Signed)
   [name], [name]                       ACCOUNT NO.:  [id number]   MEDICAL RECORD NO.:  [id number]                   PATIENT TYPE:  INP   LOCATION:  A322                                 FACILITY:  [facility]   PHYSICIAN:  Lazaro Arms, M.D.                DATE OF BIRTH:  October 26, 1960   DATE OF ADMISSION:  02/06/2002  DATE OF DISCHARGE:  02/09/2002                                 DISCHARGE SUMMARY   DISCHARGE DIAGNOSES:  1. Status post a supracervical hysterectomy and a right salpingo-     oophorectomy.  2. Probable severe rectosigmoid diverticulitis.   PROCEDURES:  As above.   Please refer to the transcribed history and physical and the operative note  for details of admission to the hospital.   HOSPITAL COURSE:  The patient was admitted postoperatively with concern  about her rectosigmoid.  Dr. Elpidio Anis was an intraoperative consult and  he felt concern, as I did, that the patient may have a rectosigmoid  malignancy; however, we got a CT scan on the day of discharge which reveals  probably severe diverticulitis.  Additionally, she has done well.  Her  hemoglobin and hematocrit were 10.2 and 30.8 on postoperative day #1 and 9.8  and 29.7 on postoperative day #3.  The white count was still elevated at  12,000 at time of discharge.  My concern is that she may have some  diverticulitis, and in fact she does have some evidence of early cellulitis  from her incision.  I tried opening it with a Q-Tip and a very small amount  of seroma fluid came out.  There is no evidence of hematoma or abscess at  this point, although we will remain keenly attuned to that.  I am going to  see her in the office on Tuesday as scheduled.   DISCHARGE MEDICATIONS:  1. Levaquin 500 a day.  2. Flagyl 500 twice a day for the next three weeks.  3. She was given Toradol and Tylox for pain.   DISCHARGE INSTRUCTIONS:  She was given instructions and precautions for  return prior to that time.                                              Lazaro Arms, M.D.    [name]  D:  02/09/2002  T:  02/12/2002  Job:  [id number]

## 2010-09-25 NOTE — H&P (Signed)
NAMEADEJA, [name]             ACCOUNT NO.:  [id number]   MEDICAL RECORD NO.:  [id number]          PATIENT TYPE:  AMB   LOCATION:  DAY                           FACILITY:  [facility]   PHYSICIAN:  J. Darreld Mclean, M.D. DATE OF BIRTH:  03-Nov-1960   DATE OF ADMISSION:  DATE OF DISCHARGE:  LH                                HISTORY & PHYSICAL   CHIEF COMPLAINT:  My left knee hurts.   HISTORY OF PRESENT ILLNESS:  The patient is a 50 year old female who had  knee pain since January 16, 2004 when she fell in front of a [employer] in town.  She reinjured her knee on September 12.  She saw Dr. Chilton Si  on the 12th.  He ordered an MRI of the knee which was done on the 13th.  It  showed a moderate joint effusion and tear of the midbody of the medial  meniscus of the knee on the left.  She was then asked to be seen here.  She  was told of the possibility of knee surgery.  She said she would like to try  other means of treatment if available.  I injected the knee with Depo-Medrol  40 mg, with good results.  She came back the following week and said the  knee was still bothering her and that she had thought about it and decided  to go ahead and proceed with knee arthroscopy.  Arthroscopy is now  scheduled.  The risks and imponderables of the procedure have been discussed  preoperatively, and she appears to understand and agreed to them.   PAST MEDICAL HISTORY:  Negative, except for problems with her colon.   PAST SURGICAL HISTORY:  1.  She had a hysterectomy in 2003.  2.  She had significant diverticulitis which required colon resection with      an ostomy bag and then a reversal of that.  The first surgery was done      in November of 2003, and the second surgery with reanastomosis was done      in March of 2004.  She has done well status post that.   ALLERGIES:  CODEINE.   CURRENT MEDICATIONS:  1.  Effexor 37.5 mg once a day.  2.  Premarin once a day.  3.  Darvocet-N 100 every  six hours p.r.n. pain.  4.  Naprosyn 500 one twice a day.   SOCIAL HISTORY:  She does not smoke.  She does not use any alcoholic  beverages.  She is a Engineer, agricultural.  She works for [employer] here in  [place].  She is married.   PRIMARY CARE PHYSICIAN:  Corrie Mckusick, M.D.   PHYSICAL EXAMINATION:  VITAL SIGNS:  Blood pressure 146/92, pulse 80,  respirations 16, afebrile.  She is 5 feet 7 inches and weighs 292.  HEENT:  Negative.  NECK:  Supple.  LUNGS:  Clear to percussion and auscultation.  HEART:  Regular without murmur.  ABDOMEN:  Soft, nontender, without masses.  EXTREMITIES:  Left knee painful and tender, particularly in the medial joint  line with  a slight effusion and slight crepitus.  Neurovascularly intact.  Other extremities within normal limits.  CNS:  Intact.  SKIN:  Intact.   IMPRESSION:  Tear of the medial meniscus, left knee.   PLAN:  Operative arthroscopy of the left knee.  Labs are pending.  I have  discussed with the patient the planned procedure, risks and imponderables.  She appears to understand and agrees to the procedure as outlined.      JWK/MEDQ  D:  02/03/2004  T:  02/03/2004  Job:  [id number]

## 2010-09-25 NOTE — H&P (Signed)
[name], [name]                       ACCOUNT NO.:  [id number]   MEDICAL RECORD NO.:  [id number]                   PATIENT TYPE:  AMB   LOCATION:  DAY                                  FACILITY:  [facility]   PHYSICIAN:  Jerolyn Shin C. Katrinka Blazing, M.D.                DATE OF BIRTH:  1960-10-20   DATE OF ADMISSION:  DATE OF DISCHARGE:                                HISTORY & PHYSICAL   HISTORY OF PRESENT ILLNESS:  [age]-year-old female with history of  finding of abdominal mass in the sigmoid colon during the time of  hysterectomy in September 2003.  She has greater than a one-year history of  left-sided abdominal pain.  She has alternating constipation and diarrhea.  She gets pain before each bowel movement.  There has been an attempt at  having colonoscopy, but this had to be discontinued because of pain.  The  patient states that she is having severe pain with each bowel movement.  She  has recovered adequately from her surgery and she is now scheduled to have  colonoscopy for evaluation of the sigmoid colon mass.   PAST HISTORY:  The patient has a history of seasonal allergies, menstrual-  related migraines and chronic psoriasis.   PAST SURGICAL HISTORY:  Surgeries include tubal ligation, heel spur and a  hysterectomy.   MEDICATIONS:  Lexapro.   ALLERGIES:  CODEINE.   SOCIAL HISTORY:  The patient is married and employed.  She does not drink,  smoke or use drugs.   REVIEW OF SYSTEMS:  Review of systems is negative, except as noted above.   FAMILY HISTORY:  Family history is positive for hypertension, heart disease  and malignancies of unknown type.   PHYSICAL EXAMINATION:  VITAL SIGNS:  On examination blood pressure 114/82,  pulse 88, respirations 18 and weight 273 pounds.  HEENT:  Unremarkable.  NECK:  Supple.  No JVD or bruit.  CHEST:  Clear to auscultation.  No rales, rubs, rhonchi or wheezes.  HEART:  Regular rate and rhythm without murmur, gallop or rub.  ABDOMEN:   Soft with tenderness in the left lower quadrant.  She has a  Pfannenstiel incision with mild erythema of the incision.  EXTREMITY EXAMINATION:  No clubbing, cyanosis or edema.  NEUROLOGIC EXAMINATION:  No motor, sensory or cerebellar deficit.   IMPRESSION:  1. Sigmoid colon mass with significant change in bowel habits with     progression despite long term antibiotic use; must rule out neoplastic     disease.  2. Hypertension.   PLAN:  The patient will have colonoscopy.   Because of her symptoms we will probably need to proceed with sigmoid colon  resection.  If her colonoscopy cannot be completed we will proceed with air  contrast barium enema prior to colon surgery.  Dirk Dress. Katrinka Blazing, M.D.    LCS/MEDQ  D:  03/27/2002  T:  03/28/2002  Job:  [id number]

## 2010-09-25 NOTE — Op Note (Signed)
NAMEALEIRA, DEITER                       ACCOUNT NO.:  [id number]   MEDICAL RECORD NO.:  [id number]                   PATIENT TYPE:  INP   LOCATION:  A322                                 FACILITY:  [facility]   PHYSICIAN:  Lazaro Arms, M.D.                DATE OF BIRTH:  09-17-1960   DATE OF PROCEDURE:  02/06/2002  DATE OF DISCHARGE:                                 OPERATIVE REPORT   PREOPERATIVE DIAGNOSES:  1. Menometrorrhagia.  2. Dysmenorrhea.  3. Anemia.  4. Responded to Megace therapy.   POSTOPERATIVE DIAGNOSES:  1. Menometrorrhagia.  2. Dysmenorrhea.  3. Anemia.  4. Responded to Megace therapy.  5. A rectosigmoid mass concerning for a colon cancer.   PROCEDURE:  1. Supracervical hysterectomy.  2. Right salpingo-oophorectomy.  3. Intra-operative consult by Dr. Elpidio Anis   SURGEON:  Lazaro Arms, M.D.   ANESTHESIA:  General endotracheal   FINDINGS:  The patient had a lobular, enlarged uterus basically filling the  pelvis.  Both ovaries appeared to be normal, but she had, what I thought at  first was diverticulitis that was sort of retracting and puckering the cul-  de-sac and left ovary down to the uterus.  I could not develop a  rectovaginal plane at all and amputated the uterus once I got the uterine  vasculature.   At this point, I could tell then, and get my hand in and feel that she had a  rectosigmoid mass that I was concerned about a carcinoma.  There were no  exophytic features.  The rest of the abdomen and pelvis, visually, and by  palpation were normal.  I have Dr. Elpidio Anis come in and he palpated as  well, and felt that, indeed, the patient had a rectosigmoid mass that would  need an anterior approach.  He has recommended doing a CT scan during this  hospitalization for further delineation and he will probably do a  colonoscopy in about a month.  He also agreed with doing a supracervical  because I could not develop the plane on the left.   The right was free, but  the left was not.   DESCRIPTION OF PROCEDURE:  The patient was taken to the operating room and  placed in the supine position where she underwent general endotracheal  anesthesia.  She was then placed in the frog-legged position.  Her vagina  was prepped and Foley catheter was placed.  Her legs were then straightened.  Her abdomen was prepped and draped in the usual sterile fashion.  A  Pfannenstiel skin incision was made and carried down sharply through the  rectus fascia which was scored in the midline and extended laterally.  The  fascia was taken off the muscle superiorly and inferiorly without  difficulty.  The muscles were divided.  No cavity was entered.  A Balfour  self-retaining retractor was placed.  The upper abdomen was  packed away and  the patient was placed in Trendelenburg position.  It was difficult because  the uterus would not elevate because of this area on the left, I thought,  was diverticulitis at the time.   The uterine cornu were grasped.  The left round ligament was suture ligated  and cut.  The right round ligament was suture ligated and cut.  The utero-  ovarian ligament on the left was clamped, cut, and suture ligated.  The  infundibulopelvic ligament on the right was clamped, cut, and suture  ligated.  The vesicouterine serosal flap was created and the bladder was  pushed off the lower uterine segment.  The uterine vessels were skeletonized  bilaterally.  The uterine vessels were clamped, cut, and suture ligated.  I  then took 1 more small bite; and I amputated the uterus at this point and  sent to pathology.  I then did sort of a reverse cone, and used my Bovie to  delineate the cervical canal and cauterized it aggressively.  I could not  dissect any plane between the rectosigmoid and cervix and vagina.   I called Dr. Katrinka Blazing in who agreed that this represented a mass in the  rectosigmoid that was most concerning for a carcinoma.  He  did not feel like  it was diverticulitis, and neither did I, at this point.  As a result, I did  not take any further pedicles down on the left, 1 more on the right.  There  was some bleeding on that side and I had to put several hemoclips and  suturing, but always staying medial to the uterine vessels so as to avoid  any ureteral insult.  There was good hemostasis at this point.   The cervix was then closed over the cervical canal with interrupted sutures  and her left ovary and tube were left in place.  Dr. Katrinka Blazing said that the  cervix and the left ovary and tube will probably be removed during the  resection of her malignancy.   The pelvis was irrigated vigorously.  All pedicles were found to be  hemostatic. The self-retaining retractor and the laps were removed.  The  muscles were reapproximated loosely.  The fascia was closed in a running  fashion.  The subcutaneous tissue was made hemostatic.  The skin was closed  using skin staples.  The patient tolerated the procedure well.  She  experienced 350 cc of blood loss; and was taken to the recovery room in good  stable condition.  All counts were correct x3.                                               Lazaro Arms, M.D.    [name]  D:  02/06/2002  T:  02/06/2002  Job:  [id number]

## 2010-09-25 NOTE — Discharge Summary (Addendum)
[name], [name]                       ACCOUNT NO.:  [id number]   MEDICAL RECORD NO.:  [id number]                   PATIENT TYPE:  INP   LOCATION:  A207                                 FACILITY:  [facility]   PHYSICIAN:  Dirk Dress. Katrinka Blazing, M.D.                DATE OF BIRTH:  1960-08-30   DATE OF ADMISSION:  07/10/2002  DATE OF DISCHARGE:  07/17/2002                                 DISCHARGE SUMMARY   DISCHARGE DIAGNOSES:  1. Diverticulitis status post ____ QA MARKER: 13 ____ proce  2. Chief perioperative hypoxemia probably due to basilar atelectasis.  No     evidence of ____ QA MARKER: 39 ____.       3. Anxiety disorder.  ____ QA MARKER: 50 ____         SPECIAL PROCEDURES:  Colostomy closure with incidental appendectomy.   DISPOSITION:  The patient is discharged in stable and satisfactory  condition.   DISCHARGE MEDICATIONS:  1. Tylox p.r.n.  2. ____ QA MARKER: 72 ____ mg q.  3. Premarin 1.25 mg daily.  4. ____ QA MARKER: 76 ____        5. Ativan 1 mg t.i.d.  6. ____ QA MARKER: 81 ____ q.d.    The patient ____ QA MARKER: 84 ____ 42-ye  colectomy with ____ QA MARKER: 103 ____ exten  Patient has been well ____ QA MARKER: 117 ____.  She  readmitted for colostomy closure.   Exam is unremarkable.  The patient is being admitted through [facility]  after undergoing a bowel prep.  Colostomy closure was done along with an  incidental ____ QA MARKER: 144 ____, beca  146____ appendix.  ____ QA MARKER____.  course was complicated by increased anxiety and an episode of oxygen  desaturation.  She had some pleuritic chest pain.  Spiral CT was ordered and  was ____ QA MARKER: 185 ____.  It   basilar atelectasis, and she was given ____ QA MARKER: 199 ____.  She  good return of bowel function and was having flatus ____ QA MARKER: 208                                              ____.  ____ QA MARKER____   had to be transfused two units of packed cells  because of drop in the  hemoglobin.  ____ QA MARKER: 229 ____.  Pos  ____ QA MARKER: 237 ____ at th  and had no further difficulty.  She tolerated a regular diet.  She was  discharged to home ____ QA MARKER: 255 ____  Dirk Dress. Katrinka Blazing, M.D.   LCS/MEDQ  D:  08/08/2002  T:  08/08/2002  Job:  [id number]
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
## 2010-09-25 NOTE — Discharge Summary (Signed)
[name], [name]                       ACCOUNT NO.:  [id number]   MEDICAL RECORD NO.:  [id number]                   PATIENT TYPE:  INP   LOCATION:  A303                                 FACILITY:  [facility]   PHYSICIAN:  Dirk Dress. Katrinka Blazing, M.D.                DATE OF BIRTH:  04-12-1961   DATE OF ADMISSION:  03/28/2002  DATE OF DISCHARGE:  04/10/2002                                 DISCHARGE SUMMARY   DISCHARGE DIAGNOSES:  1. Acute diverticulitis.  2. Hypertension.   PROCEDURES:  1. On March 28, 2002, flexible sigmoidoscopy.  2. On March 30, 2002, a sigmoid colon resection with end colostomy and     Hartman's pouch.  3. Left salpingo-oophorectomy.  4. Left central venous catheter insertion.   CONDITION ON DISCHARGE:  The patient is discharged home in a stable improved  condition.   DISCHARGE MEDICATIONS:  1. Lexapro 20 mg daily.  2. Prevacid 30 mg b.i.d.  3. Flagyl 500 mg q.i.d.  4. Doxycycline 100 mg b.i.d.  5. Ativan 1 mg t.i.d.  6. Ambien 10 mg q.h.s.  7. Tylox one or two q.4h. p.r.n. pain.   DISPOSITION:  The patient will have a home health nurse follow up for  colostomy care, wound management, and the removal of her staples.   HISTORY OF PRESENT ILLNESS:  This is a 50 year old female with a history of  an abdominal mass, intra-abdominal findings during the time of her  hysterectomy in September 2003.  She had greater than a one-year history of  left-sided abdominal pain.  She had alternating constipation and diarrhea.  She got pain after every bowel movement.  There had been an attempt at  having a colonoscopy.  This was discontinued because of pain.  The pain  became more severe with each bowel movement.  It was felt that she was  adequately recovered from her hysterectomy, and she was admitted for a  colonoscopy and probable operative therapy.   PAST MEDICAL HISTORY:  Not significant otherwise.   HOSPITAL COURSE:  The patient underwent a flexible sigmoid  colostomy on  March 28, 2002.  She had a high-grade strictured area at 50 cm in the mid-  sigmoid.  There was erythematous edematous mucosa.  I was not able to  maneuver the scope past the tight strictured area.  We changed to a  pediatric scope, and could not maneuver past this area.  The patient was  admitted for operative therapy.  She was counseled for this and on March 30, 2002, an exploratory laparotomy was done.  She was found to have  extensive involvement of the entire sigmoid colon and proximal rectum, with  severe inflammatory changes and a sternal abscess.  The left ovary was also  extensively involved, and the vessels to the ovary were compromised during  the dissection.  The left ovary was therefore removed.  It was felt that  there  was too much inflammation for the anastomosis, so a Hartman's  procedure was done.  The pelvis was drained.  The patient had a somewhat  protracted course because of the severe inflammation and the need for  copious antibiotics.  She had slow return of bowel function.  She underwent  teaching for her colostomy.  We were able to advance her to clear liquids on  April 03, 2002.  She tolerated this well.  She developed a monilial  vaginitis and was treated with Monistat vaginal cream.  She had some  depression because of her illness.  She was offered counseling, but decided  that she did not need it.  She had gradual improvement.  Her diet was  advanced.  She became more capable of caring for her stoma.  The JP drainage  was mostly serous.   DISPOSITION:  She did well and was discharged home on the April 10, 2002,  in satisfactory condition.                                                Dirk Dress. Katrinka Blazing, M.D.    LCS/MEDQ  D:  06/25/2002  T:  06/25/2002  Job:  [id number]

## 2010-09-25 NOTE — Op Note (Signed)
NAMELAZARIA, SCHABEN             ACCOUNT NO.:  [id number]   MEDICAL RECORD NO.:  [id number]          PATIENT TYPE:  AMB   LOCATION:  SDS                          FACILITY:  [facility]   PHYSICIAN:  Lubertha Basque. Dalldorf, M.D.DATE OF BIRTH:  1961/01/31   DATE OF PROCEDURE:  12/31/2004  DATE OF DISCHARGE:  12/31/2004                                 OPERATIVE REPORT   PREOPERATIVE DIAGNOSES:  1.  Left shoulder torn medial meniscus.  2.  Left knee DJD.   POSTOPERATIVE DIAGNOSES:  1.  Left shoulder torn medial meniscus.  2.  Left knee DJD.   PROCEDURE:  1.  Left knee partial medial meniscectomy.  2.  Left knee chondroplasty and abrasion patellofemoral.   ANESTHESIA:  General.   ATTENDING SURGEON:  Lubertha Basque. Jerl Santos, M.D.   FIRST ASSISTANT:  Carnaghi, PA   INDICATIONS FOR PROCEDURE:  The patient is a 50 year old woman with long  history of left knee difficulty. She is actually about a year from a knee  arthroscopy but unfortunately, suffered a second injury. She has failed oral  anti-inflammatories and various injectables. By MRI scan she has a new  meniscal tear and some degenerative changes. She has pain at rest, pain  which limits her activities. She is offered an arthroscopy. Informed  operative consent was obtained at discussion of possible complications of  reaction to anesthesia and infection.   DESCRIPTION OF PROCEDURE:  The patient is taken to an operating suite where  general anesthetic was applied without difficulty. She was positioned supine  and prepped and draped in normal sterile fashion. After administration of  preop IV antibiotics, arthroscopy was performed through two inferior  portals. Suprapatellar pouch was benign except for an abundance of  cartilaginous loose bodies which were removed. The patellofemoral joint  exhibited some grade 3 and focal grade 4 change. She did have some flaps of  articular cartilage which required debridement back to stable tissues and  an  abrasion arthroplasty was done back to bleeding bone in this compartment. In  the medial compartment, she had mostly grade 3 change with some small areas  grade 4 change.  A thorough chondroplasty was done here but no abrasion was  added. She did have a degenerative tear of the posterior horn of the medial  meniscus addressed with about a 10% partial medial meniscectomy back to  stable tissues. This involved predominately the posterior horn. The ACL and  PCL were intact. The lateral compartment was benign with no evidence of  meniscal or articular cartilage injury. The knee was thoroughly irrigated  and the end of the case followed by placement Marcaine with epinephrine and  morphine plus some Depo-Medrol. Adaptic was placed over the portals followed  by dry gauze and loose Ace wrap. Estimated blood loss and intraoperative  fluids can be obtained from anesthesia records.   DISPOSITION:  The patient was extubated in the operating room and taken to  recovery in stable addition. Plans were for her to go home the same day and  follow up in the office in less than a week. I will contact by phone  tonight.      Lubertha Basque Jerl Santos, M.D.  Electronically Signed     PGD/MEDQ  D:  12/31/2004  T:  01/01/2005  Job:  [id number]

## 2010-10-30 ENCOUNTER — Encounter (INDEPENDENT_AMBULATORY_CARE_PROVIDER_SITE_OTHER): Payer: Self-pay | Admitting: Surgery

## 2010-11-17 ENCOUNTER — Encounter (HOSPITAL_COMMUNITY): Payer: Medicare Other | Admitting: Psychiatry

## 2010-12-03 ENCOUNTER — Encounter (INDEPENDENT_AMBULATORY_CARE_PROVIDER_SITE_OTHER): Payer: Medicare Other | Admitting: Psychiatry

## 2010-12-03 DIAGNOSIS — F3189 Other bipolar disorder: Secondary | ICD-10-CM

## 2011-01-09 ENCOUNTER — Emergency Department (HOSPITAL_COMMUNITY): Payer: Medicare Other

## 2011-01-09 ENCOUNTER — Emergency Department (HOSPITAL_COMMUNITY)
Admission: EM | Admit: 2011-01-09 | Discharge: 2011-01-09 | Disposition: A | Discharge status: Home / Self Care | Payer: Medicare Other | Attending: Emergency Medicine | Admitting: Emergency Medicine

## 2011-01-09 ENCOUNTER — Other Ambulatory Visit: Payer: Self-pay

## 2011-01-09 ENCOUNTER — Encounter (HOSPITAL_COMMUNITY): Payer: Self-pay | Admitting: *Deleted

## 2011-01-09 DIAGNOSIS — E119 Type 2 diabetes mellitus without complications: Secondary | ICD-10-CM | POA: Insufficient documentation

## 2011-01-09 DIAGNOSIS — R0789 Other chest pain: Secondary | ICD-10-CM

## 2011-01-09 DIAGNOSIS — R071 Chest pain on breathing: Secondary | ICD-10-CM | POA: Insufficient documentation

## 2011-01-09 LAB — CBC
HCT: 42.9 % (ref 36.0–46.0)
MCH: 30.4 pg (ref 26.0–34.0)
MCV: 88.8 fL (ref 78.0–100.0)
Platelets: 188 10*3/uL (ref 150–400)
RDW: 13.5 % (ref 11.5–15.5)
WBC: 7.4 10*3/uL (ref 4.0–10.5)

## 2011-01-09 LAB — DIFFERENTIAL
Basophils Absolute: 0 10*3/uL (ref 0.0–0.1)
Eosinophils Absolute: 0.4 10*3/uL (ref 0.0–0.7)
Eosinophils Relative: 5 % (ref 0–5)
Lymphocytes Relative: 34 % (ref 12–46)
Lymphs Abs: 2.5 10*3/uL (ref 0.7–4.0)
Monocytes Absolute: 0.6 10*3/uL (ref 0.1–1.0)

## 2011-01-09 LAB — BASIC METABOLIC PANEL
CO2: 26 mEq/L (ref 19–32)
Calcium: 9.1 mg/dL (ref 8.4–10.5)
Creatinine, Ser: 0.5 mg/dL (ref 0.50–1.10)
GFR calc non Af Amer: >60 mL/min (ref >60)
Glucose, Bld: 89 mg/dL (ref 70–99)
Sodium: 139 mEq/L (ref 135–145)

## 2011-01-09 LAB — POCT I-STAT TROPONIN I: Troponin i, poc: 0.01 ng/mL (ref 0.00–0.08)

## 2011-01-09 MED ORDER — TRAMADOL HCL 50 MG PO TABS: 50.0000 mg | ORAL_TABLET | Freq: Four times a day (QID) | ORAL | Status: AC | PRN

## 2011-01-09 NOTE — ED Notes (Signed)
Pt c/o left side chest pain with pain radiating to left arm for 1 week. Pt states she has been attending a physical training class and she has been doing pushups and situps.

## 2011-01-09 NOTE — ED Provider Notes (Signed)
History     CSN: [id number] Arrival date & time: 01/09/2011 11:46 AM  Chief Complaint  Patient presents with  . Chest Pain   Patient is a 50 y.o. female presenting with chest pain. The history is provided by the patient.  Chest Pain The chest pain began 2 days ago (pt complains of left chest pain worse with movement ). Chest pain occurs constantly. The chest pain is unchanged. The pain is associated with lifting. At its most intense, the pain is at 3/10. The pain is currently at 2/10. The severity of the pain is moderate. The quality of the pain is described as aching. The pain does not radiate. Pertinent negatives for primary symptoms include no fever, no fatigue, no cough, no wheezing and no abdominal pain.  Pertinent negatives for associated symptoms include no diaphoresis. Risk factors include obesity.  Pertinent negatives for past medical history include no Marfan's syndrome and no seizures.  Procedure history is negative for exercise treadmill test.     Past Medical History  Diagnosis Date  . Diabetes mellitus   . Migraine     Past Surgical History  Procedure Date  . Colon surgery   . Laparoscopic gastric banding   . Heel spur surgery   . Abdominal hysterectomy     Family History  Problem Relation Age of Onset  . Cancer Mother   . Heart disease Mother     History  Substance Use Topics  . Smoking status: Never Smoker   . Smokeless tobacco: Not on file  . Alcohol Use: Yes     occationally    OB History    Grav Para Term Preterm Abortions TAB SAB Ect Mult Living                  Review of Systems  Constitutional: Negative for fever, diaphoresis and fatigue.  HENT: Negative for congestion, sinus pressure and ear discharge.   Eyes: Negative for discharge.  Respiratory: Negative for cough and wheezing.   Cardiovascular: Positive for chest pain.  Gastrointestinal: Negative for abdominal pain and diarrhea.  Genitourinary: Negative for frequency and hematuria.    Musculoskeletal: Negative for back pain.  Skin: Negative for rash.  Neurological: Negative for seizures and headaches.  Hematological: Negative.   Psychiatric/Behavioral: Negative for hallucinations.    Physical Exam  BP 124/76  Pulse 70  Resp 14  SpO2 98%  Physical Exam  Constitutional: She is oriented to person, place, and time. She appears well-developed.  HENT:  Head: Normocephalic and atraumatic.  Eyes: Conjunctivae and EOM are normal. No scleral icterus.  Neck: Neck supple. No thyromegaly present.  Cardiovascular: Normal rate and regular rhythm.  Exam reveals no gallop and no friction rub.   No murmur heard. Pulmonary/Chest: No stridor. She has no wheezes. She has no rales. She exhibits no tenderness.       Tender left ant. ;chest  Abdominal: She exhibits no distension. There is no tenderness. There is no rebound.  Musculoskeletal: Normal range of motion. She exhibits no edema.  Lymphadenopathy:    She has no cervical adenopathy.  Neurological: She is oriented to person, place, and time. Coordination normal.  Skin: No rash noted. No erythema.  Psychiatric: She has a normal mood and affect. Her behavior is normal.    ED Course  Procedures  MDM Chest pain,  Chest wall pain  Results for orders placed during the hospital encounter of 01/09/11  CBC      Component Value Range  WBC 7.4  4.0 - 10.5 (K/uL)   RBC 4.83  3.87 - 5.11 (MIL/uL)   Hemoglobin 14.7  12.0 - 15.0 (g/dL)   HCT 16.1  09.6 - 04.5 (%)   MCV 88.8  78.0 - 100.0 (fL)   MCH 30.4  26.0 - 34.0 (pg)   MCHC 34.3  30.0 - 36.0 (g/dL)   RDW 40.9  81.1 - 91.4 (%)   Platelets 188  150 - 400 (K/uL)  DIFFERENTIAL      Component Value Range   Neutrophils Relative 52  43 - 77 (%)   Neutro Abs 3.9  1.7 - 7.7 (K/uL)   Lymphocytes Relative 34  12 - 46 (%)   Lymphs Abs 2.5  0.7 - 4.0 (K/uL)   Monocytes Relative 8  3 - 12 (%)   Monocytes Absolute 0.6  0.1 - 1.0 (K/uL)   Eosinophils Relative 5  0 - 5 (%)    Eosinophils Absolute 0.4  0.0 - 0.7 (K/uL)   Basophils Relative 1  0 - 1 (%)   Basophils Absolute 0.0  0.0 - 0.1 (K/uL)  BASIC METABOLIC PANEL      Component Value Range   Sodium 139  135 - 145 (mEq/L)   Potassium 4.0  3.5 - 5.1 (mEq/L)   Chloride 105  96 - 112 (mEq/L)   CO2 26  19 - 32 (mEq/L)   Glucose, Bld 89  70 - 99 (mg/dL)   BUN 20  6 - 23 (mg/dL)   Creatinine, Ser 7.82  0.50 - 1.10 (mg/dL)   Calcium 9.1  8.4 - 95.6 (mg/dL)   GFR calc non Af Amer >60  >60 (mL/min)   GFR calc Af Amer >60  >60 (mL/min)  POCT I-STAT TROPONIN I      Component Value Range   Troponin i, poc 0.01  0.00 - 0.08 (ng/mL)   Comment 3            Dg Chest Portable 1 View  01/09/2011  *RADIOLOGY REPORT*  Clinical Data: Chest pain  PORTABLE CHEST - 1 VIEW  Comparison: 10/26/2007  Findings: Midline trachea.  Normal heart size and mediastinal contours. No pleural effusion or pneumothorax.  Clear lungs.  IMPRESSION: No acute cardiopulmonary disease.  Original Report Authenticated By: Consuello Bossier, M.D.      Date: 01/09/2011  Rate: 64  Rhythm: normal sinus rhythm  QRS Axis: normal  Intervals: normal  ST/T Wave abnormalities: nonspecific ST changes  Conduction Disutrbances:none  Narrative Interpretation:   Old EKG Reviewed: none available     Benny Lennert, MD 01/09/11 1505

## 2011-01-18 ENCOUNTER — Encounter (INDEPENDENT_AMBULATORY_CARE_PROVIDER_SITE_OTHER): Payer: Self-pay | Admitting: Surgery

## 2011-01-28 ENCOUNTER — Encounter (INDEPENDENT_AMBULATORY_CARE_PROVIDER_SITE_OTHER): Payer: Medicare Other | Admitting: Psychiatry

## 2011-01-28 DIAGNOSIS — F3189 Other bipolar disorder: Secondary | ICD-10-CM

## 2011-01-29 ENCOUNTER — Ambulatory Visit (INDEPENDENT_AMBULATORY_CARE_PROVIDER_SITE_OTHER): Payer: Medicare Other | Admitting: Physician Assistant

## 2011-01-29 ENCOUNTER — Encounter (INDEPENDENT_AMBULATORY_CARE_PROVIDER_SITE_OTHER): Payer: Self-pay

## 2011-01-29 VITALS — BP 136/82 | HR 80 | Temp 97.1°F | Resp 18 | Ht 67.0 in | Wt 254.2 lb

## 2011-01-29 DIAGNOSIS — Z4651 Encounter for fitting and adjustment of gastric lap band: Secondary | ICD-10-CM

## 2011-01-29 LAB — POCT CARDIAC MARKERS
CKMB, poc: <1 — ABNORMAL LOW
Myoglobin, poc: 31.9
Troponin i, poc: <0.05

## 2011-01-29 NOTE — Progress Notes (Signed)
  HISTORY: [name] is a 50 y.o.female who received an AP-Large lap-band in November 2009 by Dr. Ezzard Standing. She comes in with recent increase in hunger and portion sizes. No vomiting or regurgitation. 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  VITAL SIGNS: Filed Vitals:   01/29/11 1440  BP: 136/82  Pulse: 80  Temp: 97.1 F (36.2 C)  Resp: 18    PHYSICAL EXAM: Physical exam reveals a very well-appearing 50 y.o.female in no apparent distress Neurologic: Awake, alert, oriented Psych: Bright affect, conversant Respiratory: Breathing even and unlabored. No stridor or wheezing Abdomen: Soft, nontender, nondistended to palpation. Incisions well-healed. No incisional hernias. Port easily palpated. Extremities: Atraumatic, good range of motion.  ASSESMENT: 50 y.o.  female  s/p AP-Large lap-band.   PLAN: The patient's port was accessed with a 20G Huber needle without difficulty. Clear fluid was aspirated and 0.25 mL saline was added to the port to give a total predicted volume of 7.6 mL. The patient was able to swallow water without difficulty following the procedure and was instructed to take clear liquids for the next 24-48 hours and advance slowly as tolerated.

## 2011-01-29 NOTE — Patient Instructions (Signed)
Take clear liquids for the next 48 hours. Thin protein shakes are ok to start on Saturday evening. Call us if you have persistent vomiting or regurgitation, night cough or reflux symptoms. Return as scheduled or sooner if you notice no changes in hunger/portion sizes.   

## 2011-02-05 LAB — TSH: TSH: 5.238 — ABNORMAL HIGH

## 2011-02-09 LAB — COMPREHENSIVE METABOLIC PANEL
Albumin: 3.7
Alkaline Phosphatase: 82
BUN: 16
Calcium: 9.3
Creatinine, Ser: 0.63
Glucose, Bld: 100 — ABNORMAL HIGH
Total Protein: 6.9

## 2011-02-09 LAB — DIFFERENTIAL
Basophils Relative: 1
Lymphocytes Relative: 36
Lymphs Abs: 1
Lymphs Abs: 3.5
Monocytes Absolute: 0.1
Monocytes Absolute: 0.7
Monocytes Relative: 1 — ABNORMAL LOW
Monocytes Relative: 8
Neutro Abs: 4.8
Neutro Abs: 7.8 — ABNORMAL HIGH
Neutrophils Relative %: 50
Neutrophils Relative %: 87 — ABNORMAL HIGH

## 2011-02-09 LAB — CBC
HCT: 43.9
Hemoglobin: 14
Hemoglobin: 14.6
MCHC: 33.2
MCV: 88.6
MCV: 88.8
Platelets: 204
RBC: 4.66
RDW: 14.1
WBC: 9

## 2011-02-09 LAB — GLUCOSE, CAPILLARY
Glucose-Capillary: 114 — ABNORMAL HIGH
Glucose-Capillary: 124 — ABNORMAL HIGH
Glucose-Capillary: 147 — ABNORMAL HIGH
Glucose-Capillary: 164 — ABNORMAL HIGH
Glucose-Capillary: 98

## 2011-03-19 ENCOUNTER — Other Ambulatory Visit: Payer: Self-pay | Admitting: Neurology

## 2011-03-19 DIAGNOSIS — D352 Benign neoplasm of pituitary gland: Secondary | ICD-10-CM

## 2011-03-19 DIAGNOSIS — G43019 Migraine without aura, intractable, without status migrainosus: Secondary | ICD-10-CM

## 2011-03-23 ENCOUNTER — Other Ambulatory Visit: Payer: Self-pay | Admitting: Neurology

## 2011-03-24 ENCOUNTER — Ambulatory Visit
Admission: RE | Admit: 2011-03-24 | Discharge: 2011-03-24 | Disposition: A | Discharge status: Home / Self Care | Payer: Medicare Other | Source: Ambulatory Visit | Attending: Neurology | Admitting: Neurology

## 2011-03-24 DIAGNOSIS — G43019 Migraine without aura, intractable, without status migrainosus: Secondary | ICD-10-CM

## 2011-03-24 DIAGNOSIS — D352 Benign neoplasm of pituitary gland: Secondary | ICD-10-CM

## 2011-03-24 MED ORDER — GADOBENATE DIMEGLUMINE 529 MG/ML IV SOLN
10.0000 mL | Freq: Once | INTRAVENOUS | Status: AC | PRN
  Administered 2011-03-24: 10 mL via INTRAVENOUS

## 2011-03-30 ENCOUNTER — Ambulatory Visit (HOSPITAL_COMMUNITY): Payer: Medicare Other | Admitting: Psychiatry

## 2011-03-30 ENCOUNTER — Encounter (HOSPITAL_COMMUNITY): Payer: Medicare Other | Admitting: Psychiatry

## 2011-04-09 ENCOUNTER — Encounter (INDEPENDENT_AMBULATORY_CARE_PROVIDER_SITE_OTHER): Payer: Self-pay

## 2011-04-09 ENCOUNTER — Ambulatory Visit (INDEPENDENT_AMBULATORY_CARE_PROVIDER_SITE_OTHER): Payer: Medicare Other | Admitting: Physician Assistant

## 2011-04-09 VITALS — BP 130/82 | Ht 67.0 in | Wt 245.8 lb

## 2011-04-09 DIAGNOSIS — Z4651 Encounter for fitting and adjustment of gastric lap band: Secondary | ICD-10-CM

## 2011-04-09 NOTE — Patient Instructions (Signed)
Return in 3-6 months or sooner if necessary.

## 2011-04-09 NOTE — Progress Notes (Signed)
  HISTORY: JILLIENNE EGNER is a 50 y.o.female who received an AP-Large lap-band in November 2009 by Dr. Ezzard Standing. She is complaining of persistent reflux since eating food too quickly on Thanksgiving. She would like a small amount of fluid to be removed. She can tolerate only liquids and yogurt without getting nausea.  VITAL SIGNS: Filed Vitals:   04/09/11 1313  BP: 130/82    PHYSICAL EXAM: Physical exam reveals a very well-appearing 50 y.o.female in no apparent distress Neurologic: Awake, alert, oriented Psych: Bright affect, conversant Respiratory: Breathing even and unlabored. No stridor or wheezing Abdomen: Soft, nontender, nondistended to palpation. Incisions well-healed. No incisional hernias. Port easily palpated. Extremities: Atraumatic, good range of motion.  ASSESMENT: 50 y.o.  female  s/p AP-large lap-band.   PLAN: The patient's port was accessed with a 20G Huber needle without difficulty. Clear fluid was aspirated and 0.25 mL saline was removed from the port to give a total predicted volume of 7.35 mL. The patient was able to swallow water without difficulty following the procedure and was instructed to take clear liquids for the next 24-48 hours and advance slowly as tolerated.

## 2011-04-13 ENCOUNTER — Ambulatory Visit (INDEPENDENT_AMBULATORY_CARE_PROVIDER_SITE_OTHER): Payer: Medicare Other | Admitting: Psychiatry

## 2011-04-13 DIAGNOSIS — F3189 Other bipolar disorder: Secondary | ICD-10-CM

## 2011-04-13 MED ORDER — ARIPIPRAZOLE 5 MG PO TABS: 5.0000 mg | ORAL_TABLET | Freq: Every day | ORAL | Status: DC

## 2011-04-13 MED ORDER — CARBAMAZEPINE ER 300 MG PO CP12: 300.0000 mg | ORAL_CAPSULE | Freq: Two times a day (BID) | ORAL | Status: DC

## 2011-04-13 NOTE — Progress Notes (Signed)
Patient came for her followup appointment. She continues to have GI symptoms. She has history of lap band surgery. She endorse constipation and takes medication for chronic constipation. Overall her mood has been stable on her bipolar medication. She reported less agitation anger or mood swings. She is sleeping better. She had a good Thanksgiving. Recently she has seen her neurologist who has been an MRI to see the progress of her Pitutory cancer. She was told to take more Topamax however patient is scared to take more than 100 mg. She continues to have episodic memory issues and does not want to raise her Topamax. Her issues with her husband his chronic and recently no new issues. She denies any side effects of medication. She likes her Tegretol and Abilify. She denies any tremors shakes or extrapyramidal side effects.  Mental status examination Patient is casually dressed. She is morbid obese. Her speech is fast but coherent. She appears emotional but relevant in conversation. She denies any auditory or visual hallucination. She denies any active or passive suicidal thinking or homicidal thinking. She described her mood is anxious and her affect is mood congruent. Her attention and concentration is fair. She's alert and oriented x3. Her insight judgment and impulse control is fair.  Assessment Bipolar disorder NOS  Plan I will continue her Tegretol 300 mg twice a day and Abilify 5 mg at bedtime. I have explained risks and benefits of medication in detail. I will do Tegretol level. I recommended to call if she has any question or concern about the medication or if she feels worsening of her illness. I will see her again in 10 weeks

## 2011-06-22 ENCOUNTER — Encounter (HOSPITAL_COMMUNITY): Payer: Self-pay | Admitting: Psychiatry

## 2011-06-22 ENCOUNTER — Ambulatory Visit (INDEPENDENT_AMBULATORY_CARE_PROVIDER_SITE_OTHER): Payer: Medicare Other | Admitting: Psychiatry

## 2011-06-22 VITALS — BP 150/94 | HR 90 | Wt 250.0 lb

## 2011-06-22 DIAGNOSIS — F3189 Other bipolar disorder: Secondary | ICD-10-CM

## 2011-06-22 MED ORDER — CARBAMAZEPINE ER 300 MG PO CP12: 300.0000 mg | ORAL_CAPSULE | Freq: Two times a day (BID) | ORAL | Status: DC

## 2011-06-22 MED ORDER — ARIPIPRAZOLE 5 MG PO TABS: 5.0000 mg | ORAL_TABLET | Freq: Every day | ORAL | Status: DC

## 2011-06-22 NOTE — Progress Notes (Signed)
Chief complaint I need my medication I'm doing well  History of presenting illness Patient is 51 year old Caucasian female who came for her followup appointment. She's been compliant with the medication and reported no side effects. She continues to have episodic agitation and anger however most of the time she reported her mood is as stable. She was upset when her son made a joke that she missed [employer] and need to pay fine. She was not happy about it however she was able to control her anger. She is sleeping 6-7 hours. She has Tegretol level done which is within normal limits. 6.6 Overall she is stable on her current medication and reported no side effects of any medication. She denies any agitation or severe aggression.  Past psychiatric history Patient has at least 4 psychiatric inpatient treatment due to manic episode. In the past she has been poorly compliant with her medication and followup appointment however since she is taking the Abilify and Tegretol she is pretty stable. She denies any history of suicidal attempt  Medical history Patient has diabetes mellitus arthritis and constipation. She also has laparoscopic gastric band surgery and knee surgery.  Mental status examination Patient is casually dressed and fairly groomed. She is obese. She maintained fair eye contact. Her speech is fast but clear and coherent. She is emotional and pleasant during the interview. She denies any active or passive suicidal thoughts or homicidal thoughts. Her attention and concentration is distracted at times. There are no psychotic symptoms present. She denies any auditory or visual hallucination. She's alert and oriented x3. Her insight judgment and pulse control is okay.  Assessment Axis I bipolar disorder Axis II deferred Axis III see medical history Axis IV moderate Axis V 55-65  Plan I will continue her Tegretol Abilify on her current dose. I have explained risks and benefits of medication. I  recommended to call us if she has any question or concern about the medication. At this time she has no side effects of medication. I will see her again in 3 months

## 2011-07-22 ENCOUNTER — Encounter (INDEPENDENT_AMBULATORY_CARE_PROVIDER_SITE_OTHER): Payer: Self-pay

## 2011-07-22 ENCOUNTER — Ambulatory Visit (INDEPENDENT_AMBULATORY_CARE_PROVIDER_SITE_OTHER): Payer: Medicare Other | Admitting: Physician Assistant

## 2011-07-22 VITALS — BP 126/82 | Ht 67.0 in | Wt 253.6 lb

## 2011-07-22 DIAGNOSIS — Z4651 Encounter for fitting and adjustment of gastric lap band: Secondary | ICD-10-CM

## 2011-07-22 NOTE — Progress Notes (Signed)
  HISTORY: [name] is a 51 y.o.female who received an AP-Large lap-band in November 2009 by Dr. Ezzard Standing. She comes in complaining of increased hunger, especially in the last month or so. She denies vomiting or regurgitation. She's able to eat an entire 3 oz chicken breast first thing in the morning, and despite this, has poor satiety.  VITAL SIGNS: Filed Vitals:   07/22/11 1231  BP: 126/82    PHYSICAL EXAM: Physical exam reveals a very well-appearing 51 y.o.female in no apparent distress Neurologic: Awake, alert, oriented Psych: Bright affect, conversant Respiratory: Breathing even and unlabored. No stridor or wheezing Abdomen: Soft, nontender, nondistended to palpation. Incisions well-healed. No incisional hernias. Port easily palpated. Extremities: Atraumatic, good range of motion.  ASSESMENT: 51 y.o.  female  s/p AP-Large lap-band.   PLAN: The patient's port was accessed with a 20G Huber needle without difficulty. Clear fluid was aspirated and 0.25 mL saline was added to the port to give a total predicted volume of 7.6 mL. The patient was able to swallow water without difficulty following the procedure and was instructed to take clear liquids for the next 24-48 hours and advance slowly as tolerated.

## 2011-07-22 NOTE — Patient Instructions (Signed)
Take clear liquids tonight. Thin protein shakes are ok to start tomorrow morning. Slowly advance your diet thereafter. Call us if you have persistent vomiting or regurgitation, night cough or reflux symptoms. Return as scheduled or sooner if you notice no changes in hunger/portion sizes.  

## 2011-08-05 ENCOUNTER — Encounter (INDEPENDENT_AMBULATORY_CARE_PROVIDER_SITE_OTHER): Payer: Medicare Other

## 2011-09-21 ENCOUNTER — Ambulatory Visit (INDEPENDENT_AMBULATORY_CARE_PROVIDER_SITE_OTHER): Payer: Medicare Other | Admitting: Psychiatry

## 2011-09-21 ENCOUNTER — Encounter (HOSPITAL_COMMUNITY): Payer: Self-pay | Admitting: Psychiatry

## 2011-09-21 VITALS — Wt 255.0 lb

## 2011-09-21 DIAGNOSIS — F3189 Other bipolar disorder: Secondary | ICD-10-CM

## 2011-09-21 DIAGNOSIS — F319 Bipolar disorder, unspecified: Secondary | ICD-10-CM

## 2011-09-21 MED ORDER — ARIPIPRAZOLE 5 MG PO TABS: 5.0000 mg | ORAL_TABLET | Freq: Every day | ORAL | Status: DC

## 2011-09-21 MED ORDER — CARBAMAZEPINE ER 300 MG PO CP12: 300.0000 mg | ORAL_CAPSULE | Freq: Two times a day (BID) | ORAL | Status: DC
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
## 2011-09-24 ENCOUNTER — Other Ambulatory Visit: Payer: Self-pay | Admitting: Gastroenterology

## 2011-09-27 NOTE — Telephone Encounter (Signed)
Pt needs OV 1st.  Not seen in over 2 yrs.

## 2011-09-28 ENCOUNTER — Other Ambulatory Visit: Payer: Self-pay | Admitting: Obstetrics & Gynecology

## 2011-09-28 ENCOUNTER — Other Ambulatory Visit (HOSPITAL_COMMUNITY)
Admission: RE | Admit: 2011-09-28 | Discharge: 2011-09-28 | Disposition: A | Discharge status: Home / Self Care | Payer: Medicare Other | Source: Ambulatory Visit | Attending: Obstetrics & Gynecology | Admitting: Obstetrics & Gynecology

## 2011-09-28 ENCOUNTER — Encounter: Payer: Self-pay | Admitting: Internal Medicine

## 2011-09-28 DIAGNOSIS — Z124 Encounter for screening for malignant neoplasm of cervix: Secondary | ICD-10-CM | POA: Insufficient documentation

## 2011-09-28 NOTE — Telephone Encounter (Signed)
Mailed letter to patient to call our office to set up OV to further her refills °

## 2011-10-14 ENCOUNTER — Encounter (INDEPENDENT_AMBULATORY_CARE_PROVIDER_SITE_OTHER): Payer: Medicare Other

## 2011-10-15 ENCOUNTER — Ambulatory Visit (INDEPENDENT_AMBULATORY_CARE_PROVIDER_SITE_OTHER): Payer: Medicare Other | Admitting: Psychiatry

## 2011-10-15 DIAGNOSIS — F319 Bipolar disorder, unspecified: Secondary | ICD-10-CM
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
## 2011-10-18 NOTE — Patient Instructions (Signed)
Discussed orally 

## 2011-11-03 ENCOUNTER — Ambulatory Visit (INDEPENDENT_AMBULATORY_CARE_PROVIDER_SITE_OTHER): Payer: Medicare Other | Admitting: Gastroenterology

## 2011-11-03 ENCOUNTER — Other Ambulatory Visit: Payer: Self-pay | Admitting: Internal Medicine

## 2011-11-03 VITALS — BP 132/79 | HR 69 | Temp 96.1°F | Ht 66.5 in | Wt 260.8 lb

## 2011-11-03 DIAGNOSIS — K219 Gastro-esophageal reflux disease without esophagitis: Secondary | ICD-10-CM

## 2011-11-03 DIAGNOSIS — K625 Hemorrhage of anus and rectum: Secondary | ICD-10-CM

## 2011-11-03 DIAGNOSIS — D126 Benign neoplasm of colon, unspecified: Secondary | ICD-10-CM

## 2011-11-03 DIAGNOSIS — K5900 Constipation, unspecified: Secondary | ICD-10-CM

## 2011-11-03 DIAGNOSIS — R1310 Dysphagia, unspecified: Secondary | ICD-10-CM

## 2011-11-03 DIAGNOSIS — Z800 Family history of malignant neoplasm of digestive organs: Secondary | ICD-10-CM

## 2011-11-03 DIAGNOSIS — R1319 Other dysphagia: Secondary | ICD-10-CM | POA: Insufficient documentation

## 2011-11-03 DIAGNOSIS — R110 Nausea: Secondary | ICD-10-CM

## 2011-11-03 MED ORDER — DEXLANSOPRAZOLE 60 MG PO CPDR: 60.0000 mg | DELAYED_RELEASE_CAPSULE | Freq: Every day | ORAL | Status: DC

## 2011-11-03 MED ORDER — PEG 3350-KCL-NA BICARB-NACL 420 G PO SOLR: ORAL | Status: AC

## 2011-11-03 NOTE — Assessment & Plan Note (Signed)
Rectal bleeding, personal history of colonic polyps (marginal prep four years ago), FH CRC second degree relative. Recommend colonoscopy in near future.  I have discussed the risks, alternatives, benefits with regards to but not limited to the risk of reaction to medication, bleeding, infection, perforation and the patient is agreeable to proceed. Written consent to be obtained. She will have two full days of clear liquids. Additional dulcolax 10mg  X 3 days before prep. Phenergan 25mg  IV 30 mins before procedure.

## 2011-11-03 NOTE — Progress Notes (Signed)
Primary Care Physician:  Colette Ribas, MD  Primary Gastroenterologist:  Roetta Sessions, MD   Chief Complaint  Patient presents with  . Follow-up    on recall list  . Constipation    HPI:  [name] is a 51 y.o. female here for follow-up. She was last seen in 10/2009. Since that OV, she states she did go to [facility] for difficulty having BM/evacuation of BMs. She states they recommended biofeedback for pelvic floor dyssynergia, but she couldn't commit due to school. She continues to study social work, attending [employer] in the fall.   Continues to have urge for BM but difficulty getting stool out. BM twice a week. Using multiple OTC stool softners/fibers/occasional enema. Amitiza po BID. Prilosec 40mg  po TID. Feels like going to throw up after meals. Feels like food will not go down. Thinks it is stress. She does not feel it is related to her lap band. Had band adjusted in 07/2011 for complaints of feeling hungry all the time. Gained forty pound in last two years.  Eats much better in the afternoon, yesterday ate 12 oysters with crackers for supper. Every morning and lunch, has a lot of nausea and burning. No abdominal pain. Some rectal bleeding.   Current Outpatient Prescriptions  Medication Sig Dispense Refill  . ARIPiprazole (ABILIFY) 5 MG tablet Take 1 tablet (5 mg total) by mouth daily.  30 tablet  2  . Carbamazepine, Antipsychotic, (EQUETRO) 300 MG CP12 Take 1 capsule (300 mg total) by mouth 2 (two) times daily.  60 each  2  . Exenatide (BYETTA 10 MCG PEN [place]) Inject into the skin 2 (two) times daily.      . Loratadine (CLARITIN PO) Take by mouth daily.        . Lubiprostone (AMITIZA PO) Take 24 mcg by mouth 2 (two) times daily.       . Multiple Vitamin (MULTIVITAMIN PO) Take by mouth daily.        . naproxen (NAPROSYN) 500 MG tablet Take 500 mg by mouth daily.      . NON FORMULARY Calcium 500 mg chewable plus Vit D  One daily      . omeprazole (PRILOSEC) 20 MG capsule Take  40 mg by mouth 3 (three) times daily before meals.      . topiramate (TOPAMAX) 100 MG tablet Take 100 mg by mouth daily.        . traMADol (ULTRAM) 50 MG tablet Take 50 mg by mouth 2 (two) times daily.          Allergies as of 11/03/2011 - Review Complete 11/03/2011  Allergen Reaction Noted  . Bactrim Nausea And Vomiting 01/09/2011  . Codeine    . Sulfamethoxazole w-trimethoprim     Past Medical History  Diagnosis Date  . Diabetes mellitus   . Migraine   . Arthritis   . Constipation   . Generalized headaches   . Diverticula of colon 2009  . Adenomatous polyp 2009  . Anxiety   . Depression   . Pelvic floor dysfunction     abnormal anorectal [facility]ometry at [facility] in 2009   Past Surgical History  Procedure Date  . Colon surgery     complicated diverticulitis requiring sigmoid resection with colostomy and subsequent takedown  . Laparoscopic gastric banding 2009  . Heel spur surgery   . Abdominal hysterectomy   . Knee surgery     3 arthroscopic   . Colonoscopy 12/11/2007    Dr. Jena Gauss- marginal prep,  normal rectum pancolonic diverticula, adenomatous polyp  . Tubal ligation    Family History  Problem Relation Age of Onset  . Ovarian cancer Mother   . Heart disease Mother   . Cirrhosis Father     deceased age 17  . Colon cancer Other     aunt, deceased age 22  . Breast cancer Other     aunt, deceased age 98  . Liver cancer Cousin     age 24, deceased   History   Social History  . Marital Status: Married    Spouse Name: N/A    Number of Children: 2  . Years of Education: N/A   Occupational History  . student at [employer]    Social History Main Topics  . Smoking status: Never Smoker   . Smokeless tobacco: None  . Alcohol Use: 1.2 oz/week    2 Glasses of wine per week     occasionally  . Drug Use: No  . Sexually Active: None   Other Topics Concern  . None   Social History Narrative  . None     ROS:  General: Negative for anorexia, weight loss, fever, chills,  fatigue, weakness. Eyes: Negative for vision changes.  ENT: Negative for hoarseness, difficulty swallowing , nasal congestion. CV: Negative for chest pain, angina, palpitations, dyspnea on exertion, peripheral edema.  Respiratory: Negative for dyspnea at rest, dyspnea on exertion, cough, sputum, wheezing.  GI: See history of present illness. GU:  Negative for dysuria, hematuria, urinary incontinence, urinary frequency, nocturnal urination.  MS: Negative for joint pain, low back pain.  Derm: Negative for rash or itching.  Neuro: Negative for weakness, abnormal sensation, seizure, frequent headaches, memory loss, confusion.  Psych: Negative for anxiety, depression, suicidal ideation, hallucinations.  Endo: Negative for unusual weight change.  Heme: Negative for bruising or bleeding. Allergy: Negative for rash or hives.    Physical Examination:  BP 132/79  Pulse 69  Temp 96.1 F (35.6 C) (Tympanic)  Ht 5' 6.5" (1.689 m)  Wt 260 lb 12.8 oz (118.298 kg)  BMI 41.46 kg/m2   General: Well-nourished, well-developed in no acute distress. Obese. Head: Normocephalic, atraumatic.   Eyes: Conjunctiva pink, no icterus. Mouth: Oropharyngeal mucosa moist and pink , no lesions erythema or exudate. Neck: Supple without thyromegaly, masses, or lymphadenopathy.  Lungs: Clear to auscultation bilaterally.  Heart: Regular rate and rhythm, no murmurs rubs or gallops.  Abdomen: Bowel sounds are normal, nontender, nondistended, no hepatosplenomegaly or masses, no abdominal bruits or    hernia , no rebound or guarding.   Rectal: defer Extremities: No lower extremity edema. No clubbing or deformities.  Neuro: Alert and oriented x 4 , grossly normal neurologically.  Skin: Warm and dry, no rash or jaundice.   Psych: Alert and cooperative, normal mood and affect.

## 2011-11-03 NOTE — Assessment & Plan Note (Signed)
GERD, nausea, feeling of food backing up. Lap band adjusted in 07/2011 due to poor satiety/constant hunger. Patient does not feel like it is too tight as she is able to eat in evenings. No need to take Prilosec 40mg  TID. Will switch her to Dexilant 60mg  daily. Samples provided. BPE/UGI for evaluation. Further recommendations to follow.

## 2011-11-03 NOTE — Progress Notes (Signed)
Faxed to PCP

## 2011-11-03 NOTE — Patient Instructions (Addendum)
Stop Prilosec. Start Dexilant one capsule 30 minutes before breakfast. Samples provided. We have scheduled you for a colonoscopy, see separate instructions.  We have scheduled you for an xray to look at your esophagus and stomach.   Gastroesophageal Reflux Disease, Adult Gastroesophageal reflux disease (GERD) happens when acid from your stomach flows up into the esophagus. When acid comes in contact with the esophagus, the acid causes soreness (inflammation) in the esophagus. Over time, GERD may create small holes (ulcers) in the lining of the esophagus. CAUSES   Increased body weight. This puts pressure on the stomach, making acid rise from the stomach into the esophagus.   Smoking. This increases acid production in the stomach.   Drinking alcohol. This causes decreased pressure in the lower esophageal sphincter (valve or ring of muscle between the esophagus and stomach), allowing acid from the stomach into the esophagus.   Late evening meals and a full stomach. This increases pressure and acid production in the stomach.   A malformed lower esophageal sphincter.  Sometimes, no cause is found. SYMPTOMS   Burning pain in the lower part of the mid-chest behind the breastbone and in the mid-stomach area. This may occur twice a week or more often.   Trouble swallowing.   Sore throat.   Dry cough.   Asthma-like symptoms including chest tightness, shortness of breath, or wheezing.  DIAGNOSIS  Your caregiver may be able to diagnose GERD based on your symptoms. In some cases, X-rays and other tests may be done to check for complications or to check the condition of your stomach and esophagus. TREATMENT  Your caregiver may recommend over-the-counter or prescription medicines to help decrease acid production. Ask your caregiver before starting or adding any new medicines.  HOME CARE INSTRUCTIONS   Change the factors that you can control. Ask your caregiver for guidance concerning weight  loss, quitting smoking, and alcohol consumption.   Avoid foods and drinks that make your symptoms worse, such as:   Caffeine or alcoholic drinks.   Chocolate.   Peppermint or mint flavorings.   Garlic and onions.   Spicy foods.   Citrus fruits, such as oranges, lemons, or limes.   Tomato-based foods such as sauce, chili, salsa, and pizza.   Fried and fatty foods.   Avoid lying down for the 3 hours prior to your bedtime or prior to taking a nap.   Eat small, frequent meals instead of large meals.   Wear loose-fitting clothing. Do not wear anything tight around your waist that causes pressure on your stomach.   Raise the head of your bed 6 to 8 inches with wood blocks to help you sleep. Extra pillows will not help.   Only take over-the-counter or prescription medicines for pain, discomfort, or fever as directed by your caregiver.   Do not take aspirin, ibuprofen, or other nonsteroidal anti-inflammatory drugs (NSAIDs).  SEEK IMMEDIATE MEDICAL CARE IF:   You have pain in your arms, neck, jaw, teeth, or back.   Your pain increases or changes in intensity or duration.   You develop nausea, vomiting, or sweating (diaphoresis).   You develop shortness of breath, or you faint.   Your vomit is green, yellow, black, or looks like coffee grounds or blood.   Your stool is red, bloody, or black.  These symptoms could be signs of other problems, such as heart disease, gastric bleeding, or esophageal bleeding. MAKE SURE YOU:   Understand these instructions.   Will watch your condition.   Will  get help right away if you are not doing well or get worse.  Document Released: 02/03/2005 Document Revised: 04/15/2011 Document Reviewed: 11/13/2010 [facility] Patient Information 2012 [place], [place].

## 2011-11-03 NOTE — Assessment & Plan Note (Signed)
Patient with pelvic floor dyssynergia. Unable to undergo biofeedback due to college schedule. Continue current regimen for now. TCS as planned.

## 2011-11-04 ENCOUNTER — Encounter (INDEPENDENT_AMBULATORY_CARE_PROVIDER_SITE_OTHER): Payer: Medicare Other

## 2011-11-08 ENCOUNTER — Telehealth: Payer: Self-pay | Admitting: Gastroenterology

## 2011-11-08 MED ORDER — DEXLANSOPRAZOLE 60 MG PO CPDR: 60.0000 mg | DELAYED_RELEASE_CAPSULE | Freq: Every day | ORAL | Status: DC

## 2011-11-08 NOTE — Telephone Encounter (Signed)
Pt called this morning and asked if we would call in her dexilant prescription to [facility] [place]. She tried samples and they are working well for her.

## 2011-11-12 ENCOUNTER — Telehealth: Payer: Self-pay | Admitting: Internal Medicine

## 2011-11-12 ENCOUNTER — Encounter (HOSPITAL_COMMUNITY): Payer: Self-pay | Admitting: Pharmacy Technician

## 2011-11-12 NOTE — Telephone Encounter (Signed)
The Rx did not go though so I called it in today. Pt is aware

## 2011-11-12 NOTE — Telephone Encounter (Signed)
Pt called this morning saying that she had called on Monday to see if we would call in her Dexilant prescription because the samples were working well for her. [facility] [place]. Also, she said she has about 10 pills left and is getting ready to go out of town and would like to get either samples or the prescription before she leaves. Pt's number is [phone]

## 2011-11-12 NOTE — Telephone Encounter (Signed)
Rx was sent on monday to [facility] [place] Please make pt aware Thanks

## 2011-11-15 ENCOUNTER — Ambulatory Visit (HOSPITAL_COMMUNITY)
Admission: RE | Admit: 2011-11-15 | Discharge: 2011-11-15 | Disposition: A | Discharge status: Home / Self Care | Payer: Medicare Other | Source: Ambulatory Visit | Attending: Gastroenterology | Admitting: Gastroenterology

## 2011-11-15 DIAGNOSIS — R110 Nausea: Secondary | ICD-10-CM

## 2011-11-15 DIAGNOSIS — K219 Gastro-esophageal reflux disease without esophagitis: Secondary | ICD-10-CM

## 2011-11-15 DIAGNOSIS — R1310 Dysphagia, unspecified: Secondary | ICD-10-CM

## 2011-11-15 DIAGNOSIS — Z9884 Bariatric surgery status: Secondary | ICD-10-CM | POA: Insufficient documentation

## 2011-11-17 ENCOUNTER — Encounter (HOSPITAL_COMMUNITY): Payer: Self-pay | Admitting: Psychiatry

## 2011-11-17 ENCOUNTER — Ambulatory Visit (INDEPENDENT_AMBULATORY_CARE_PROVIDER_SITE_OTHER): Payer: Medicare Other | Admitting: Psychiatry

## 2011-11-17 DIAGNOSIS — F319 Bipolar disorder, unspecified: Secondary | ICD-10-CM
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
## 2011-11-17 NOTE — Patient Instructions (Signed)
Discussed orally 

## 2011-11-18 ENCOUNTER — Ambulatory Visit (INDEPENDENT_AMBULATORY_CARE_PROVIDER_SITE_OTHER): Payer: Medicare Other | Admitting: Surgery

## 2011-11-18 ENCOUNTER — Encounter (INDEPENDENT_AMBULATORY_CARE_PROVIDER_SITE_OTHER): Payer: Self-pay

## 2011-11-18 VITALS — BP 136/94 | HR 68 | Temp 97.3°F | Resp 16 | Ht 67.0 in | Wt 253.8 lb

## 2011-11-18 DIAGNOSIS — Z4651 Encounter for fitting and adjustment of gastric lap band: Secondary | ICD-10-CM

## 2011-11-18 NOTE — Patient Instructions (Signed)
Return in 3 months or sooner if needed.

## 2011-11-18 NOTE — Progress Notes (Signed)
  HISTORY: [name] is a 51 y.o.female who received an AP-Large lap-band in November 2009 by Dr. Ezzard Standing. She comes in with complaints of reflux and solid food intolerance, mostly in the mornings to mid-day.  VITAL SIGNS: Filed Vitals:   11/18/11 1234  BP: 136/94  Pulse: 68  Temp: 97.3 F (36.3 C)  Resp: 16    PHYSICAL EXAM: Physical exam reveals a very well-appearing 51 y.o.female in no apparent distress Neurologic: Awake, alert, oriented Psych: Bright affect, conversant Respiratory: Breathing even and unlabored. No stridor or wheezing Abdomen: Soft, nontender, nondistended to palpation. Incisions well-healed. No incisional hernias. Port easily palpated. Extremities: Atraumatic, good range of motion.  ASSESMENT: 51 y.o.  female  s/p AP-Large lap-band.   PLAN: The patient's port was accessed with a 20G Huber needle without difficulty. Clear fluid was aspirated and 0.25 mL saline was added to the port to give a total predicted volume of 7.35 mL.

## 2011-11-19 NOTE — Progress Notes (Signed)
Quick Note:  UGI normal. Band in expected position.  Colonoscopy as planned. Noted, she has been back to lap band adjustments. ______

## 2011-11-24 ENCOUNTER — Ambulatory Visit (HOSPITAL_COMMUNITY): Admission: RE | Admit: 2011-11-24 | Payer: Medicare Other | Source: Ambulatory Visit | Admitting: Internal Medicine

## 2011-11-24 ENCOUNTER — Encounter (HOSPITAL_COMMUNITY): Admission: RE | Payer: Self-pay | Source: Ambulatory Visit

## 2011-11-24 SURGERY — COLONOSCOPY
Anesthesia: Moderate Sedation

## 2011-12-15 ENCOUNTER — Ambulatory Visit (INDEPENDENT_AMBULATORY_CARE_PROVIDER_SITE_OTHER): Payer: Medicare Other | Admitting: Psychiatry

## 2011-12-15 DIAGNOSIS — F319 Bipolar disorder, unspecified: Secondary | ICD-10-CM

## 2011-12-16 ENCOUNTER — Encounter (INDEPENDENT_AMBULATORY_CARE_PROVIDER_SITE_OTHER): Payer: Medicare Other

## 2011-12-16 NOTE — Patient Instructions (Signed)
Discussed orally 

## 2011-12-16 NOTE — Progress Notes (Signed)
Patient:  [name]   DOB: 1960/10/23  MR Number: [id number]  [facility]: [facility]:  [address] [place],  [place], [address]  Start: Wednesday 12/15/2011 4:00 PM End: Wednesday 12/15/2011 4:50 PM  Provider/Observer:     Florencia Reasons, MSW, LCSW   Chief Complaint:      Chief Complaint  Patient presents with  . Other    Mood Disturbances    Reason For Service:     The patient is resuming services after a year and a half absence and is experiencing  increased stress, anxiety, anger outbursts, and marital issues. Patient has a long-standing history of bipolar disorder and has maintained medication compliance and treatment with psychiatrist Dr. Lolly Mustache during her absence from psychotherapy. Patient is seen for al follow-up appointment.  Interventions Strategy:  Supportive therapy, cognitive behavioral therapy  Participation Level:   Active  Participation Quality:  Appropriate      Behavioral Observation:  Casual, Alert, and Anxious  Current Psychosocial Factors: Patient 's 86 year old son is planning to visit a 44 year old woman that he recently met on line.  Content of Session:   Reviewing symptoms, processing feelings, discussing boundary issues and ways to respect boundaries  Current Status:   Patient reports improved mood but increased worry.  Patient Progress:   Good. The patient reports continued decreased stress in her marriage. She reports deciding not to complain for 40 days and cites several examples of successfully reframing negative statements. She reports Increased stress about her 62 year old son who is planning to meet a 100 year old woman in [place] that he met 6 months ago. Patient is fearful for son as he has informed her this woman is already involved in another relationship. Patient reports calling and texting this woman without her son's knowledge to express her concerns. The patient now is fearful that this woman may inform her son of their  contact. Therapist works with patient to identify and discuss boundary issues. Therapist also works with patient to identify areas within patient's control versus areas that are not within patient's control. Patient also shares information with therapist regarding her use of assertiveness skills and a situation with the woman that her church. Patient expresses more confidence and identifies appropriate responses regarding interpersonal skills.  Target Goals:    Reducing anxiety  Last Reviewed:    Goals Addressed Today:    Reducing anxiety  Impression/Diagnosis:   The patient presents with a long-standing history of bipolar disorder and currently is experiencing increased anxiety, worry, mood swings, anger outbursts, and irritability. Diagnosis: bipolar disorder  Diagnosis:  Axis I:  1. Bipolar disorder             Axis II: Deferred

## 2011-12-21 ENCOUNTER — Encounter: Payer: Self-pay | Admitting: Internal Medicine

## 2011-12-21 ENCOUNTER — Ambulatory Visit (HOSPITAL_COMMUNITY): Payer: Self-pay | Admitting: Psychiatry

## 2011-12-22 ENCOUNTER — Other Ambulatory Visit: Payer: Self-pay | Admitting: Internal Medicine

## 2011-12-22 ENCOUNTER — Encounter: Payer: Self-pay | Admitting: Gastroenterology

## 2011-12-22 ENCOUNTER — Ambulatory Visit (INDEPENDENT_AMBULATORY_CARE_PROVIDER_SITE_OTHER): Payer: Medicare Other | Admitting: Gastroenterology

## 2011-12-22 VITALS — BP 130/80 | HR 83 | Temp 98.1°F | Ht 67.0 in | Wt 262.0 lb

## 2011-12-22 DIAGNOSIS — K5900 Constipation, unspecified: Secondary | ICD-10-CM

## 2011-12-22 DIAGNOSIS — Z800 Family history of malignant neoplasm of digestive organs: Secondary | ICD-10-CM

## 2011-12-22 DIAGNOSIS — D126 Benign neoplasm of colon, unspecified: Secondary | ICD-10-CM

## 2011-12-22 DIAGNOSIS — K219 Gastro-esophageal reflux disease without esophagitis: Secondary | ICD-10-CM

## 2011-12-22 DIAGNOSIS — K625 Hemorrhage of anus and rectum: Secondary | ICD-10-CM

## 2011-12-22 DIAGNOSIS — K635 Polyp of colon: Secondary | ICD-10-CM

## 2011-12-22 MED ORDER — PANTOPRAZOLE SODIUM 40 MG PO TBEC: 40.0000 mg | DELAYED_RELEASE_TABLET | Freq: Every day | ORAL | Status: DC

## 2011-12-22 NOTE — Patient Instructions (Signed)
I have sent a prescription for pantoprazole to your pharmacy. If this is more affordable than Dexilant, then switch. Otherwise, you may continue Dexilant.  We have scheduled you for a colonoscopy with Dr. Jena Gauss. Please see separate instructions.

## 2011-12-22 NOTE — Assessment & Plan Note (Signed)
Typical gerd better controlled on Dexilant but copay high per patient. Trial of pantoprazole 40mg  daily. If comparable copay, she plans to continue Dexilant since it is working. Encourage her to follow dietary restrictions as appropriate for lap band patients.   Not mentioned above. She had BPE/UGI series since last visit which was unremarkable.

## 2011-12-22 NOTE — Assessment & Plan Note (Signed)
Rectal bleeding, personal history of colonic polyps (marginal prep four years ago), FH CRC second degree relative. Recommend colonoscopy in near future. I have discussed the risks, alternatives, benefits with regards to but not limited to the risk of reaction to medication, bleeding, infection, perforation and the patient is agreeable to proceed. Written consent to be obtained. She will have two full days of clear liquids. Additional dulcolax 10mg  X 3 days before prep. Phenergan 25mg  IV 30 mins before procedure.   Consider change in bowel regimen after colonoscopy.

## 2011-12-22 NOTE — Progress Notes (Signed)
Faxed to PCP

## 2011-12-22 NOTE — Progress Notes (Signed)
Primary Care Physician:  GOLDING,JOHN CABOT, MD  Primary Gastroenterologist:  Michael Rourk, MD   Chief Complaint  Patient presents with  . Colonoscopy    HPI:  [name] is a 51 y.o. female here to reschedule her colonoscopy. She was seen on 11/03/11 for f/u rectal bleeding, nausea, constipation. She has personal history of colon polyps (marginal prep four years ago), FH CRC second degree relative at early age. She also has h/o lap band. Most recently adjusted on 11/18/11. She previously has been seen at [facility] for difficulty having bowel movements/evacuation of bowel movements. 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  She continues to have urge for bowel movement difficulty evacuating stool. Generally has 2-3 stools weekly. She takes fiber, MiraLax, Amitiza daily. If no bowel movement in 2-3 days then she uses an enema. At time of her last office visit we switched her from Prilosec to Dexilant. She had been self-medicating with Prilosec 40 mg 3 times a day. Current regimen is working well however she does not like the $45 co-pay for Dexilant. She reports that her nausea and vomiting have improved since her last band was adjusted. Denies further dysphagia. She admits to poor diet which contributes to her ongoing weight gain. She has had some intermittent rectal bleeding. No melena. No abdominal pain.  Current Outpatient Prescriptions  Medication Sig Dispense Refill  . ALPRAZolam (XANAX) 1 MG tablet Take 1 mg by mouth 2 (two) times daily as needed. For anxiety      . ARIPiprazole (ABILIFY) 5 MG tablet Take 1 tablet (5 mg total) by mouth daily.  30 tablet  2  . Calcium Carb-Cholecalciferol 500-600 MG-UNIT CHEW Chew 1 tablet by mouth 3 (three) times daily.      . Carbamazepine, Antipsychotic, (EQUETRO) 300 MG CP12 Take 1 capsule (300 mg total) by mouth 2 (two) times daily.  60 each  2    . dexlansoprazole (DEXILANT) 60 MG capsule Take 60 mg by mouth daily.      . exenatide (BYETTA) 10 MCG/0.04ML SOLN Inject 10 mcg into the skin 2 (two) times daily with a meal.      . loratadine (CLARITIN) 10 MG tablet Take 10 mg by mouth daily.      . lubiprostone (AMITIZA) 24 MCG capsule Take 24 mcg by mouth 2 (two) times daily with a meal.      . Multiple Vitamin (MULTIVITAMIN PO) Take 1 tablet by mouth daily.       . naproxen (NAPROSYN) 500 MG tablet Take 500 mg by mouth daily.      . topiramate (TOPAMAX) 100 MG tablet Take 100 mg by mouth at bedtime.       . traMADol (ULTRAM) 50 MG tablet Take 50 mg by mouth 2 (two) times daily.        . zolpidem (AMBIEN) 10 MG tablet Take 10 mg by mouth at bedtime as needed. For sleep        Allergies as of 12/22/2011 - Review Complete 12/22/2011  Allergen Reaction Noted  . Bactrim Anaphylaxis 01/09/2011  . Codeine Nausea Only     Past Medical History  Diagnosis Date  . Diabetes mellitus   . Migraine   . Arthritis   . Constipation   . Generalized headaches   . Diverticula of colon 2009  . Adenomatous polyp 2009  . Anxiety   . Depression   .   Pelvic floor dysfunction     abnormal anorectal manometry at [facility] in 2009    Past Surgical History  Procedure Date  . Colon surgery     complicated diverticulitis requiring sigmoid resection with colostomy and subsequent takedown  . Laparoscopic gastric banding 2009  . Heel spur surgery   . Abdominal hysterectomy   . Knee surgery     3 arthroscopic   . Colonoscopy 12/11/2007    Dr. Rourk- marginal prep, normal rectum pancolonic diverticula, adenomatous polyp  . Tubal ligation   . Colonoscopy 08/28/2003       Wide open colonic anastomosis/Scattered diverticula noted throughout colon/ Small external hemorrhoids  . Colon resection   03/30/2002    with end-colostomy and Hartmann's pouch  . Laparoscopic salpingoopherectomy 03/30/2002  . Colostomy closure 07/10/2002  . Knee arthroscopy 02/04/2004      left knee/partial medial meniscectomy.    Family History  Problem Relation Age of Onset  . Ovarian cancer Mother   . Heart disease Mother   . Cirrhosis Father     deceased age 77  . Colon cancer Other     aunt, deceased age 50  . Breast cancer Other     aunt, deceased age 30  . Liver cancer Cousin     age 30, deceased    History   Social History  . Marital Status: Married    Spouse Name: N/A    Number of Children: 2  . Years of Education: N/A   Occupational History  . student at [employer]    Social History Main Topics  . Smoking status: Never Smoker   . Smokeless tobacco: Not on file  . Alcohol Use: 1.2 oz/week    2 Glasses of wine per week     occasionally  . Drug Use: No  . Sexually Active: Not on file   Other Topics Concern  . Not on file   Social History Narrative  . No narrative on file      ROS:  General: Negative for anorexia, weight loss, fever, chills, fatigue, weakness. Eyes: Negative for vision changes.  ENT: Negative for hoarseness, difficulty swallowing , nasal congestion. CV: Negative for chest pain, angina, palpitations, dyspnea on exertion, peripheral edema.  Respiratory: Negative for dyspnea at rest, dyspnea on exertion, cough, sputum, wheezing.  GI: See history of present illness. GU:  Negative for dysuria, hematuria, urinary incontinence, urinary frequency, nocturnal urination.  MS: Negative for joint pain, low back pain.  Derm: Negative for rash or itching.  Neuro: Negative for weakness, abnormal sensation, seizure, frequent headaches, memory loss, confusion.  Psych: Negative for anxiety, depression, suicidal ideation, hallucinations.  Endo: Negative for unusual weight change.  Heme: Negative for bruising or bleeding. Allergy: Negative for rash or hives.    Physical Examination:  BP 130/80  Pulse 83  Temp 98.1 F (36.7 C) (Temporal)  Ht 5' 7" (1.702 m)   General: Well-nourished, well-developed in no acute distress.  Head:  Normocephalic, atraumatic.   Eyes: Conjunctiva pink, no icterus. Mouth: Oropharyngeal mucosa moist and pink , no lesions erythema or exudate. Neck: Supple without thyromegaly, masses, or lymphadenopathy.  Lungs: Clear to auscultation bilaterally.  Heart: Regular rate and rhythm, no murmurs rubs or gallops.  Abdomen: Bowel sounds are normal, nontender, nondistended, no hepatosplenomegaly or masses, no abdominal bruits or    hernia , no rebound or guarding.   Rectal: defer Extremities: No lower extremity edema. No clubbing or deformities.  Neuro: Alert and oriented x 4 , grossly   normal neurologically.  Skin: Warm and dry, no rash or jaundice.   Psych: Alert and cooperative, normal mood and affect.    

## 2011-12-30 ENCOUNTER — Ambulatory Visit (INDEPENDENT_AMBULATORY_CARE_PROVIDER_SITE_OTHER): Payer: Medicare Other | Admitting: Psychiatry

## 2011-12-30 ENCOUNTER — Encounter (HOSPITAL_COMMUNITY): Payer: Self-pay | Admitting: Psychiatry

## 2011-12-30 DIAGNOSIS — F319 Bipolar disorder, unspecified: Secondary | ICD-10-CM

## 2011-12-30 DIAGNOSIS — F3189 Other bipolar disorder: Secondary | ICD-10-CM

## 2011-12-30 MED ORDER — CARBAMAZEPINE ER 300 MG PO CP12: 300.0000 mg | ORAL_CAPSULE | Freq: Two times a day (BID) | ORAL | Status: DC

## 2011-12-30 MED ORDER — ARIPIPRAZOLE 5 MG PO TABS: 5.0000 mg | ORAL_TABLET | Freq: Every day | ORAL | Status: DC

## 2011-12-30 NOTE — Progress Notes (Signed)
Chief complaint Medication management and followup.  History of presenting illness Patient is 51 year old Caucasian female who came for her followup appointment.  She is seeing therapist for family stress .  On her last visit when she came with her husband I strongly encouraged her to see therapist for marital stress.  She's feeling better since started therapy and counseling.  She sleeping better.  She denies any recent panic attack agitation anger mood swing.  She still concerned about her husband who does not see his psychiatrist and is still has few episodes of outburst but overall she is handling him better.  She denies any side effects of medication.  She denies any crying spells.  She's not drinking or using any illegal substance.  She likes her current psychiatric medication.  Current psychiatric medication Xanax given by primary care physician  Abilify 5 mg daily Tegretol 300 mg twice a day.  Her last Tegretol level was 6.6.  Past psychiatric history Patient has at least 4 psychiatric inpatient treatment due to manic episode. In the past she has been poorly compliant with her medication and followup appointment however since she is taking the Abilify and Tegretol she is pretty stable. She denies any history of suicidal attempt  Medical history Patient has diabetes mellitus arthritis and constipation. She also has laparoscopic gastric band surgery and knee surgery.  Mental status examination Patient is casually dressed and fairly groomed.  She is obese.  She maintained good eye contact.  She is calm and cooperative.  Her speech is soft clear and coherent.  Her thought process is logical.  She described her mood is anxious and her affect is mood appropriate.  She denies any active or passive suicidal thoughts or homicidal thoughts he there were no psychotic symptoms present at this time.  There were no auditory or visual hallucination.  Her attention and concentration is fair.  She's alert  and oriented x3.  Her insight judgment and impulse control is okay  Assessment Axis I bipolar disorder Axis II deferred Axis III see medical history Axis IV moderate Axis V 55-65  Plan I recommend to see therapist for her social and coping skills.  I will continue her current psychiatric medication.  I review her medication she is not taking Ambien we will discontinue.  She still takes Xanax from her primary care physician which she used for only severe anxiety.  I recommend to call us if she has any question or concern about the medication or she feels worsening of the symptoms.  I will see her again in 3 months.

## 2012-01-03 ENCOUNTER — Encounter (HOSPITAL_COMMUNITY): Payer: Self-pay | Admitting: Pharmacy Technician

## 2012-01-05 ENCOUNTER — Other Ambulatory Visit: Payer: Self-pay | Admitting: Obstetrics & Gynecology

## 2012-01-05 DIAGNOSIS — N63 Unspecified lump in unspecified breast: Secondary | ICD-10-CM

## 2012-01-12 ENCOUNTER — Ambulatory Visit (HOSPITAL_COMMUNITY)
Admission: RE | Admit: 2012-01-12 | Discharge: 2012-01-12 | Disposition: A | Discharge status: Home / Self Care | Payer: Medicare Other | Source: Ambulatory Visit | Attending: Obstetrics & Gynecology | Admitting: Obstetrics & Gynecology

## 2012-01-12 ENCOUNTER — Other Ambulatory Visit: Payer: Self-pay | Admitting: Obstetrics & Gynecology

## 2012-01-12 DIAGNOSIS — N63 Unspecified lump in unspecified breast: Secondary | ICD-10-CM

## 2012-01-12 DIAGNOSIS — Z1231 Encounter for screening mammogram for malignant neoplasm of breast: Secondary | ICD-10-CM | POA: Insufficient documentation

## 2012-01-19 ENCOUNTER — Ambulatory Visit (HOSPITAL_COMMUNITY): Payer: Self-pay | Admitting: Psychiatry

## 2012-01-19 MED ORDER — SODIUM CHLORIDE 0.45 % IV SOLN
INTRAVENOUS | Status: DC
  Administered 2012-01-20: 1000 mL via INTRAVENOUS

## 2012-01-20 ENCOUNTER — Ambulatory Visit (HOSPITAL_COMMUNITY)
Admission: RE | Admit: 2012-01-20 | Discharge: 2012-01-20 | Disposition: A | Discharge status: Home Health | Payer: Medicare Other | Source: Ambulatory Visit | Attending: Internal Medicine | Admitting: Internal Medicine

## 2012-01-20 ENCOUNTER — Encounter (HOSPITAL_COMMUNITY): Payer: Self-pay | Admitting: *Deleted

## 2012-01-20 ENCOUNTER — Encounter (HOSPITAL_COMMUNITY)
Admission: RE | Disposition: A | Discharge status: Home Health | Payer: Self-pay | Source: Ambulatory Visit | Attending: Internal Medicine

## 2012-01-20 DIAGNOSIS — Z860100 Personal history of colon polyps, unspecified: Secondary | ICD-10-CM | POA: Insufficient documentation

## 2012-01-20 DIAGNOSIS — K5900 Constipation, unspecified: Secondary | ICD-10-CM

## 2012-01-20 DIAGNOSIS — E119 Type 2 diabetes mellitus without complications: Secondary | ICD-10-CM | POA: Insufficient documentation

## 2012-01-20 DIAGNOSIS — K921 Melena: Secondary | ICD-10-CM

## 2012-01-20 DIAGNOSIS — Z800 Family history of malignant neoplasm of digestive organs: Secondary | ICD-10-CM | POA: Insufficient documentation

## 2012-01-20 DIAGNOSIS — K635 Polyp of colon: Secondary | ICD-10-CM

## 2012-01-20 DIAGNOSIS — Z538 Procedure and treatment not carried out for other reasons: Secondary | ICD-10-CM | POA: Insufficient documentation

## 2012-01-20 DIAGNOSIS — K625 Hemorrhage of anus and rectum: Secondary | ICD-10-CM

## 2012-01-20 HISTORY — PX: FLEXIBLE SIGMOIDOSCOPY: SHX5431

## 2012-01-20 SURGERY — SIGMOIDOSCOPY, FLEXIBLE
Anesthesia: Moderate Sedation

## 2012-01-20 MED ORDER — PROMETHAZINE HCL 25 MG/ML IJ SOLN
INTRAMUSCULAR | Status: AC
  Filled 2012-01-20: qty 1

## 2012-01-20 MED ORDER — MIDAZOLAM HCL 5 MG/5ML IJ SOLN
INTRAMUSCULAR | Status: AC
  Filled 2012-01-20: qty 10

## 2012-01-20 MED ORDER — PROMETHAZINE HCL 25 MG/ML IJ SOLN
25.0000 mg | Freq: Once | INTRAMUSCULAR | Status: AC
  Administered 2012-01-20: 25 mg via INTRAVENOUS

## 2012-01-20 MED ORDER — MEPERIDINE HCL 100 MG/ML IJ SOLN
INTRAMUSCULAR | Status: DC | PRN
  Administered 2012-01-20: 50 mg via INTRAVENOUS

## 2012-01-20 MED ORDER — STERILE WATER FOR IRRIGATION IR SOLN
Status: DC | PRN
  Administered 2012-01-20: 10:00:00

## 2012-01-20 MED ORDER — MEPERIDINE HCL 100 MG/ML IJ SOLN
INTRAMUSCULAR | Status: AC
  Filled 2012-01-20: qty 2

## 2012-01-20 MED ORDER — LIDOCAINE HCL 2 % EX GEL
CUTANEOUS | Status: AC
  Filled 2012-01-20: qty 30

## 2012-01-20 MED ORDER — SODIUM CHLORIDE 0.9 % IJ SOLN
INTRAMUSCULAR | Status: AC
  Filled 2012-01-20: qty 10

## 2012-01-20 MED ORDER — MIDAZOLAM HCL 5 MG/5ML IJ SOLN
INTRAMUSCULAR | Status: DC | PRN
  Administered 2012-01-20: 2 mg via INTRAVENOUS

## 2012-01-20 NOTE — Op Note (Signed)
[facility] [address] [place], [address]   COLONOSCOPY PROCEDURE REPORT  PATIENT: Samantha, [name]  MR#:         [id number] BIRTHDATE: 24-Apr-1961 , 51  yrs. old GENDER: Female ENDOSCOPIST:  R.  Roetta Sessions, MD FACP FACG REFERRED BY:  Alm Bustard. PROCEDURE DATE:  01/20/2012 PROCEDURE:     attempted/incomplete colonoscopy  INDICATIONS: Hematochezia; history of colonic polyps; positive family history colon cancer.  INFORMED CONSENT:  The risks, benefits, alternatives and imponderables including but not limited to bleeding, perforation as well as the possibility of a missed lesion have been reviewed.  The potential for biopsy, lesion removal, etc. have also been discussed.  Questions have been answered.  All parties agreeable. Please see the history and physical in the medical record for more information.  MEDICATIONS: Versed 2 mg IV and Demerol 50 mg IV in a single dose. There and 25 mg IV  DESCRIPTION OF PROCEDURE:  After a digital rectal exam was performed, the EC-[id number] ([id number])  colonoscope was advanced from the anus through the rectum and colon to the area of the cecum, ileocecal valve and appendiceal orifice.  The cecum was deeply intubated.  These structures were well-seen and photographed for the record.  From the level of the cecum and ileocecal valve, the scope was slowly and cautiously withdrawn.  The mucosal surfaces were carefully surveyed utilizing scope tip deflection to facilitate fold flattening as needed.  The scope was pulled down into the rectum where a thorough examination including retroflexion was performed.    FINDINGS:  patient had solid stool in the lumen of the rectum and sigmoid with a coating of tenacious stool obscuring most of the colonic mucosa to the mid sigmoid. It was apparent that this was an inadequate prep. The scope was withdrawnfrom the sigmoid and no further attempt to complete exam was  made.  THERAPEUTIC / DIAGNOSTIC MANEUVERS PERFORMED:  none  COMPLICATIONS: none  CECAL WITHDRAWAL TIME:  not applicable  IMPRESSION:  incomplete/attempted colonoscopy. Poor prep precluded examination.  RECOMMENDATIONS: As previously recommended, biofeedback would be very beneficial for pelvic floor dysynergy. I recommend she get down to [facility] to have the recommended biofeedback therapy which is very effective in this setting. Also, I recommended she go ahead and set up a diagnostic colonoscopy down there while she is being seen. I have discussed the importance of following through on an adequately prepped colonoscopy with her husband at length. I showed him pictures of her poor prep. He tells me that not only did she only take half of her prep yesterday she also ate food last evening which was contrary to prep instructions. I also told him that not having an adequate exam of her colon, with her symptoms, could adversely effect her health.     _______________________________ eSigned:  R. Roetta Sessions, MD FACP [facility] 01/20/2012 10:19 AM   CC:    PATIENT NAME:  Samantha, [name] MR#: [id number]

## 2012-01-20 NOTE — H&P (View-Only) (Signed)
Primary Care Physician:  Colette Ribas, MD  Primary Gastroenterologist:  Roetta Sessions, MD   Chief Complaint  Patient presents with  . Colonoscopy    HPI:  [name] is a 51 y.o. female here to reschedule her colonoscopy. She was seen on 11/03/11 for f/u rectal bleeding, nausea, constipation. She has personal history of colon polyps (marginal prep four years ago), FH CRC second degree relative at early age. She also has h/o lap band. Most recently adjusted on 11/18/11. She previously has been seen at [facility] for difficulty having bowel movements/evacuation of bowel movements. 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  She continues to have urge for bowel movement difficulty evacuating stool. Generally has 2-3 stools weekly. She takes fiber, MiraLax, Amitiza daily. If no bowel movement in 2-3 days then she uses an enema. At time of her last office visit we switched her from Prilosec to Dexilant. She had been self-medicating with Prilosec 40 mg 3 times a day. Current regimen is working well however she does not like the $45 co-pay for Dexilant. She reports that her nausea and vomiting have improved since her last band was adjusted. Denies further dysphagia. She admits to poor diet which contributes to her ongoing weight gain. She has had some intermittent rectal bleeding. No melena. No abdominal pain.  Current Outpatient Prescriptions  Medication Sig Dispense Refill  . ALPRAZolam (XANAX) 1 MG tablet Take 1 mg by mouth 2 (two) times daily as needed. For anxiety      . ARIPiprazole (ABILIFY) 5 MG tablet Take 1 tablet (5 mg total) by mouth daily.  30 tablet  2  . Calcium Carb-Cholecalciferol 500-600 MG-UNIT CHEW Chew 1 tablet by mouth 3 (three) times daily.      . Carbamazepine, Antipsychotic, (EQUETRO) 300 MG CP12 Take 1 capsule (300 mg total) by mouth 2 (two) times daily.  60 each  2    . dexlansoprazole (DEXILANT) 60 MG capsule Take 60 mg by mouth daily.      Marland Kitchen exenatide (BYETTA) 10 MCG/0.04ML SOLN Inject 10 mcg into the skin 2 (two) times daily with a meal.      . loratadine (CLARITIN) 10 MG tablet Take 10 mg by mouth daily.      Marland Kitchen lubiprostone (AMITIZA) 24 MCG capsule Take 24 mcg by mouth 2 (two) times daily with a meal.      . Multiple Vitamin (MULTIVITAMIN PO) Take 1 tablet by mouth daily.       . naproxen (NAPROSYN) 500 MG tablet Take 500 mg by mouth daily.      Marland Kitchen topiramate (TOPAMAX) 100 MG tablet Take 100 mg by mouth at bedtime.       . traMADol (ULTRAM) 50 MG tablet Take 50 mg by mouth 2 (two) times daily.        Marland Kitchen zolpidem (AMBIEN) 10 MG tablet Take 10 mg by mouth at bedtime as needed. For sleep        Allergies as of 12/22/2011 - Review Complete 12/22/2011  Allergen Reaction Noted  . Bactrim Anaphylaxis 01/09/2011  . Codeine Nausea Only     Past Medical History  Diagnosis Date  . Diabetes mellitus   . Migraine   . Arthritis   . Constipation   . Generalized headaches   . Diverticula of colon 2009  . Adenomatous polyp 2009  . Anxiety   . Depression   .  Pelvic floor dysfunction     abnormal anorectal manometry at [facility] in 2009    Past Surgical History  Procedure Date  . Colon surgery     complicated diverticulitis requiring sigmoid resection with colostomy and subsequent takedown  . Laparoscopic gastric banding 2009  . Heel spur surgery   . Abdominal hysterectomy   . Knee surgery     3 arthroscopic   . Colonoscopy 12/11/2007    Dr. Jena Gauss- marginal prep, normal rectum pancolonic diverticula, adenomatous polyp  . Tubal ligation   . Colonoscopy 08/28/2003       Wide open colonic anastomosis/Scattered diverticula noted throughout colon/ Small external hemorrhoids  . Colon resection   03/30/2002    with end-colostomy and Hartmann's pouch  . Laparoscopic salpingoopherectomy 03/30/2002  . Colostomy closure 07/10/2002  . Knee arthroscopy 02/04/2004      left knee/partial medial meniscectomy.    Family History  Problem Relation Age of Onset  . Ovarian cancer Mother   . Heart disease Mother   . Cirrhosis Father     deceased age 64  . Colon cancer Other     aunt, deceased age 48  . Breast cancer Other     aunt, deceased age 58  . Liver cancer Cousin     age 43, deceased    History   Social History  . Marital Status: Married    Spouse Name: N/A    Number of Children: 2  . Years of Education: N/A   Occupational History  . student at [employer]    Social History Main Topics  . Smoking status: Never Smoker   . Smokeless tobacco: Not on file  . Alcohol Use: 1.2 oz/week    2 Glasses of wine per week     occasionally  . Drug Use: No  . Sexually Active: Not on file   Other Topics Concern  . Not on file   Social History Narrative  . No narrative on file      ROS:  General: Negative for anorexia, weight loss, fever, chills, fatigue, weakness. Eyes: Negative for vision changes.  ENT: Negative for hoarseness, difficulty swallowing , nasal congestion. CV: Negative for chest pain, angina, palpitations, dyspnea on exertion, peripheral edema.  Respiratory: Negative for dyspnea at rest, dyspnea on exertion, cough, sputum, wheezing.  GI: See history of present illness. GU:  Negative for dysuria, hematuria, urinary incontinence, urinary frequency, nocturnal urination.  MS: Negative for joint pain, low back pain.  Derm: Negative for rash or itching.  Neuro: Negative for weakness, abnormal sensation, seizure, frequent headaches, memory loss, confusion.  Psych: Negative for anxiety, depression, suicidal ideation, hallucinations.  Endo: Negative for unusual weight change.  Heme: Negative for bruising or bleeding. Allergy: Negative for rash or hives.    Physical Examination:  BP 130/80  Pulse 83  Temp 98.1 F (36.7 C) (Temporal)  Ht 5\' 7"  (1.702 m)   General: Well-nourished, well-developed in no acute distress.  Head:  Normocephalic, atraumatic.   Eyes: Conjunctiva pink, no icterus. Mouth: Oropharyngeal mucosa moist and pink , no lesions erythema or exudate. Neck: Supple without thyromegaly, masses, or lymphadenopathy.  Lungs: Clear to auscultation bilaterally.  Heart: Regular rate and rhythm, no murmurs rubs or gallops.  Abdomen: Bowel sounds are normal, nontender, nondistended, no hepatosplenomegaly or masses, no abdominal bruits or    hernia , no rebound or guarding.   Rectal: defer Extremities: No lower extremity edema. No clubbing or deformities.  Neuro: Alert and oriented x 4 , grossly  normal neurologically.  Skin: Warm and dry, no rash or jaundice.   Psych: Alert and cooperative, normal mood and affect.

## 2012-01-20 NOTE — Interval H&P Note (Signed)
History and Physical Interval Note:  01/20/2012 9:53 AM  [name]  has presented today for surgery, with the diagnosis of CONSTIPATION,COLON POLYPS AND RECTAL BLEEDING  The various methods of treatment have been discussed with the patient and family. After consideration of risks, benefits and other options for treatment, the patient has consented to  Procedure(s) (LRB) with comments: COLONOSCOPY (N/A) - 9:45/GIVE PATIENT PHENERGAN 25MG  IV 30 MINS PRIOR TO PROCEDURE as a surgical intervention .  The patient's history has been reviewed, patient examined, no change in status, stable for surgery.  I have reviewed the patient's chart and labs.  Questions were answered to the patient's satisfaction.     Eula Listen

## 2012-01-25 ENCOUNTER — Ambulatory Visit (INDEPENDENT_AMBULATORY_CARE_PROVIDER_SITE_OTHER): Payer: Medicare Other | Admitting: Psychiatry

## 2012-01-25 DIAGNOSIS — F319 Bipolar disorder, unspecified: Secondary | ICD-10-CM

## 2012-01-26 ENCOUNTER — Encounter (HOSPITAL_COMMUNITY): Payer: Self-pay | Admitting: Internal Medicine

## 2012-01-26 NOTE — Patient Instructions (Signed)
Discussed orally 
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
## 2012-02-04 ENCOUNTER — Other Ambulatory Visit (HOSPITAL_COMMUNITY): Payer: Self-pay | Admitting: Psychiatry
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
## 2012-02-17 ENCOUNTER — Encounter (INDEPENDENT_AMBULATORY_CARE_PROVIDER_SITE_OTHER): Payer: Medicare Other

## 2012-02-24 ENCOUNTER — Telehealth: Payer: Self-pay | Admitting: Internal Medicine

## 2012-02-24 ENCOUNTER — Ambulatory Visit (INDEPENDENT_AMBULATORY_CARE_PROVIDER_SITE_OTHER): Payer: Medicare Other | Admitting: Psychiatry

## 2012-02-24 DIAGNOSIS — F319 Bipolar disorder, unspecified: Secondary | ICD-10-CM

## 2012-02-24 NOTE — Telephone Encounter (Signed)
Referral has been faxed to [facility] for Biofeedback Therapy and a Diagnostic Colonoscopy and patient is aware they will contact her with appointment and time

## 2012-02-24 NOTE — Telephone Encounter (Signed)
Patient is scheduled at [facility] on 04/10/12 at 10:00 and she is aware

## 2012-02-25 NOTE — Patient Instructions (Signed)
Discussed orally 
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
## 2012-03-23 ENCOUNTER — Ambulatory Visit (INDEPENDENT_AMBULATORY_CARE_PROVIDER_SITE_OTHER): Payer: Medicare Other | Admitting: Psychiatry

## 2012-03-23 DIAGNOSIS — F319 Bipolar disorder, unspecified: Secondary | ICD-10-CM

## 2012-03-24 ENCOUNTER — Encounter (INDEPENDENT_AMBULATORY_CARE_PROVIDER_SITE_OTHER): Payer: Self-pay | Admitting: General Surgery

## 2012-03-24 NOTE — Patient Instructions (Signed)
Discussed orally 

## 2012-03-24 NOTE — Progress Notes (Signed)
Patient:  [name]   DOB: Apr 30, 1961  MR Number: [id number]  Location: [facility]:  [address] [place],  [place], [address]  Start: Thursday 03/23/2012 4:00 PM End: Thursday 03/23/2012 4:50 PM  Provider/Observer:     Florencia Reasons, MSW, LCSW   Chief Complaint:      Chief Complaint  Patient presents with  . Anxiety  . Depression    Reason For Service:     The patient is resuming services after a year and a half absence and is experiencing  increased stress, anxiety, anger outbursts, and marital issues. Patient has a long-standing history of bipolar disorder and has maintained medication compliance and treatment with psychiatrist Dr. Lolly Mustache during her absence from psychotherapy. Patient is seen for al follow-up appointment.  Interventions Strategy:  Supportive therapy, cognitive behavioral therapy  Participation Level:   Active  Participation Quality:  Appropriate      Behavioral Observation:  Casual, Alert, and Anxious, talkative,   Current Psychosocial Factors: Patient reports ongoing marital stress.  Content of Session:   Reviewing symptoms, processing feelings, reviewing relaxation and coping techniques,   Current Status:   Patient reports decreased anxiety and worry.  Patient Progress:   Fair. The patient reports decreased  stress in her marriage as she and her husband have decreased conflict. She reports that her husband has resumed taking some of his medication. She reports they both have their moments but overall have experienced  improvement in their relationship.  She reports increased irritability due to to increased knee pain. She has had 3 injections of a 5 injection series for her knee but is experiencing no relief from pain. Patient reports her doctor says she may start to see some relief after the fifth injection which patient hopes to have before Thanksgiving. Patient expresses frustration that she has gained 34 pounds within 8 months and has  had to resume taking medication for diabetes. She reports being unable to exercise and experiencing increasing difficulty walking due to the chronic knee pain. However, patient has begun to improve her eating patterns appear and is pleased she has lost 3 pounds within the past week. Her other health problems and fluid increased dizziness as well as increased migraines. She is working with her neurologist who recently increased Topamax. Patient also reports she has to have another colonoscopy in December. Patient expresses frustration regarding multiple medical issues. Therapist and patient discuss relaxation techniques and distracting activities.   Target Goals:    Reducing anxiety  Last Reviewed:    Goals Addressed Today:    Reducing anxiety  Impression/Diagnosis:   The patient presents with a long-standing history of bipolar disorder and currently is experiencing increased anxiety, worry, mood swings, anger outbursts, and irritability. Diagnosis: bipolar disorder  Diagnosis:  Axis I:  1. Bipolar disorder             Axis II: Deferred

## 2012-03-30 ENCOUNTER — Ambulatory Visit (HOSPITAL_COMMUNITY): Payer: Self-pay | Admitting: Psychiatry

## 2012-03-30 ENCOUNTER — Encounter (INDEPENDENT_AMBULATORY_CARE_PROVIDER_SITE_OTHER): Payer: Self-pay

## 2012-03-30 ENCOUNTER — Ambulatory Visit (INDEPENDENT_AMBULATORY_CARE_PROVIDER_SITE_OTHER): Payer: Medicare Other | Admitting: Physician Assistant

## 2012-03-30 VITALS — BP 132/80 | HR 76 | Temp 97.8°F | Resp 20 | Ht 66.5 in | Wt 269.2 lb

## 2012-03-30 DIAGNOSIS — Z4651 Encounter for fitting and adjustment of gastric lap band: Secondary | ICD-10-CM

## 2012-03-30 NOTE — Patient Instructions (Signed)
Take clear liquids tonight. Thin protein shakes are ok to start tomorrow morning. Slowly advance your diet thereafter. Call us if you have persistent vomiting or regurgitation, night cough or reflux symptoms. Return as scheduled or sooner if you notice no changes in hunger/portion sizes.  

## 2012-03-30 NOTE — Progress Notes (Signed)
  HISTORY: [name] is a 51 y.o.female who received an AP-Large lap-band in November 2009 by Dr. Ezzard Standing. She comes in after being seen last in July. I had removed 0.25 mL from the band as she was having 2 months or so of regurgitation. She comes in now with no further symptoms but unfortunately has gained 15 lbs. She is now just 19 lbs below her pre-op weight. She attributes this to larger than desired portion sizes, eating sweets/carbohydrates and decreased exercise secondary to knee pain. She is seeing ortho today for another injection. She wants a small adjustment today.  VITAL SIGNS: Filed Vitals:   03/30/12 1212  BP: 132/80  Pulse: 76  Temp: 97.8 F (36.6 C)  Resp: 20    PHYSICAL EXAM: Physical exam reveals a very well-appearing 51 y.o.female in no apparent distress Neurologic: Awake, alert, oriented Psych: Bright affect, conversant Respiratory: Breathing even and unlabored. No stridor or wheezing Abdomen: Soft, nontender, nondistended to palpation. Incisions well-healed. No incisional hernias. Port easily palpated. Extremities: Atraumatic, good range of motion.  ASSESMENT: 51 y.o.  female  s/p AP-Large lap-band.   PLAN: The patient's port was accessed with a 20G Huber needle without difficulty. Clear fluid was aspirated and 0.25 mL saline was added to the port to give a total predicted volume of 7.6 mL. The patient was able to swallow water without difficulty following the procedure and was instructed to take clear liquids for the next 24-48 hours and advance slowly as tolerated. I'm concerned about the repeated fill/unfill cycles she's had recently. I believe the fill will help with regards to portion sizes but it's incumbent on her to make the proper food choices. She voiced agreement.

## 2012-03-31 ENCOUNTER — Ambulatory Visit (INDEPENDENT_AMBULATORY_CARE_PROVIDER_SITE_OTHER): Payer: Medicare Other | Admitting: Psychiatry

## 2012-03-31 ENCOUNTER — Encounter (HOSPITAL_COMMUNITY): Payer: Self-pay | Admitting: Psychiatry

## 2012-03-31 VITALS — BP 134/84 | HR 64 | Ht 64.5 in | Wt 264.2 lb

## 2012-03-31 DIAGNOSIS — R6882 Decreased libido: Secondary | ICD-10-CM

## 2012-03-31 DIAGNOSIS — F911 Conduct disorder, childhood-onset type: Secondary | ICD-10-CM

## 2012-03-31 DIAGNOSIS — F319 Bipolar disorder, unspecified: Secondary | ICD-10-CM

## 2012-03-31 DIAGNOSIS — F3189 Other bipolar disorder: Secondary | ICD-10-CM

## 2012-03-31 DIAGNOSIS — F411 Generalized anxiety disorder: Secondary | ICD-10-CM

## 2012-03-31 DIAGNOSIS — R454 Irritability and anger: Secondary | ICD-10-CM | POA: Insufficient documentation

## 2012-03-31 MED ORDER — CYPROHEPTADINE HCL 4 MG PO TABS: 4.0000 mg | ORAL_TABLET | ORAL | Status: DC | PRN

## 2012-03-31 MED ORDER — CARBAMAZEPINE ER 300 MG PO CP12: 300.0000 mg | ORAL_CAPSULE | Freq: Two times a day (BID) | ORAL | Status: DC

## 2012-03-31 MED ORDER — ARIPIPRAZOLE 5 MG PO TABS: 5.0000 mg | ORAL_TABLET | Freq: Every day | ORAL | Status: DC

## 2012-03-31 NOTE — Patient Instructions (Addendum)
For arthritis could try Could use "Move Free" or "Osteo bi Flex" for arthritic pain.   The important ingredients are Chondrotin Sulfate and Glucosamine.  Krill Oil is also helpful.  SAMe 200mg  at night might help with getting to sleep or pushing to 400mg  at night might help with getting to sleep.  It also may help with managing stress, anxiety, or depression.  It helps the arthritis.  Nature's Made is the best brand.    Meditation is KEY  Use visualization or anything that helps.  Music is also helpful. [employer] has lots of music choices.   Take care of yourself.  No one else is standing up to do the job and no one knows what you need better than you. It is your job, so get to it.   Strongly consider attending at least 6 Alan[employer] Meetings to help you learn about how your helping others to the exclusion of helping yourself is actually hurting yourself and is actually an addiction to fixing others and that you need to work the 12 Step to Happiness through the [employer]. Al-Anon Family Groups could be helpful with how to deal with substance abusing family and friends. Or your own issues of being in victim role.  There are only 40 Alanon Family Group meetings a week here in [place].  Online are current listing of those meetings @ [email or web address]  There are [employer].  Search on line and there you can learn the format and can access the schedule for yourself.  Their number is [phone]

## 2012-03-31 NOTE — Progress Notes (Signed)
Chief complaint Medication management and followup.  History of presenting illness Patient is 51 year old Caucasian female who came for her followup appointment.  She is compliant with her meds.  She is noting good control of her anger with the [employer].  She is noting trouble with her memory.  Discussed the role that Xanax and Topamax may play in that.  Mentioned that any med could be responsible, but that the Xanax is the number one suspect and Topamax is the next.  Discussed her using MyChart to access her neurologist and ask about how they would feel about allowing her to be on Neurontin and maybe using Neurontin instead of the Topamax.  I am purposing the use of Neurontin in the place of the Xanax because of the memory difficulties she is expereincing with Xanax.  She inquires if any of her meds could be lowering her libido.  It seems that Abilify and Tegretol are not as high as the SSRI's in that arena, but possible.  Discussed the use of Periactin in bringing back the spice of life.  She was agreeable with trying that.  Current psychiatric medication Xanax given by primary care physician  Abilify 5 mg daily Tegretol 300 mg twice a day.  Her last Tegretol level was 6.6.  Past psychiatric history Patient has at least 4 psychiatric inpatient treatment due to manic episode. In the past she has been poorly compliant with her medication and followup appointment however since she is taking the Abilify and Tegretol she is pretty stable. She denies any history of suicidal attempt  Medical history Patient has diabetes mellitus arthritis and constipation. She also has laparoscopic gastric band surgery and knee surgery.  Mental status examination Patient is casually dressed and fairly groomed.  She is obese.  She maintained good eye contact.  She is calm and cooperative.  Her speech is soft clear and coherent.  Her thought process is logical.  She described her mood is anxious and her affect is mood  appropriate.  She denies any active or passive suicidal thoughts or homicidal thoughts he there were no psychotic symptoms present at this time.  There were no auditory or visual hallucination.  Her attention and concentration is fair.  She's alert and oriented x3.  Her insight judgment and impulse control is okay  Assessment Axis I bipolar disorder, anger dyscontrol Axis II deferred Axis III see medical history Axis IV moderate Axis V 55-65  Plan I took her vitals.  I reviewed CC, tobacco/med/surg Hx, meds effects/ side effects, problem list, therapies and responses as well as current situation/symptoms discussed options. See orders and pt instructions for more details.

## 2012-04-03 ENCOUNTER — Telehealth (HOSPITAL_COMMUNITY): Payer: Self-pay | Admitting: *Deleted

## 2012-04-03 DIAGNOSIS — F411 Generalized anxiety disorder: Secondary | ICD-10-CM

## 2012-04-03 MED ORDER — GABAPENTIN 100 MG PO CAPS: 100.0000 mg | ORAL_CAPSULE | Freq: Three times a day (TID) | ORAL | Status: DC

## 2012-04-03 NOTE — Telephone Encounter (Signed)
Pt stated that I discussed with her a new medication for anxiety.  Neurontin was what she remembered.  Will order.

## 2012-04-09 HISTORY — PX: COLONOSCOPY: SHX174

## 2012-04-20 ENCOUNTER — Ambulatory Visit (INDEPENDENT_AMBULATORY_CARE_PROVIDER_SITE_OTHER): Payer: Medicare Other | Admitting: Psychiatry

## 2012-04-20 DIAGNOSIS — F319 Bipolar disorder, unspecified: Secondary | ICD-10-CM
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
## 2012-04-21 NOTE — Patient Instructions (Signed)
Discussed orally 

## 2012-05-12 ENCOUNTER — Encounter (HOSPITAL_COMMUNITY): Payer: Self-pay | Admitting: Psychiatry

## 2012-05-12 ENCOUNTER — Ambulatory Visit (INDEPENDENT_AMBULATORY_CARE_PROVIDER_SITE_OTHER): Payer: Medicare Other | Admitting: Psychiatry

## 2012-05-12 VITALS — Wt 265.4 lb

## 2012-05-12 DIAGNOSIS — F911 Conduct disorder, childhood-onset type: Secondary | ICD-10-CM

## 2012-05-12 DIAGNOSIS — R454 Irritability and anger: Secondary | ICD-10-CM

## 2012-05-12 DIAGNOSIS — F319 Bipolar disorder, unspecified: Secondary | ICD-10-CM

## 2012-05-12 DIAGNOSIS — R6882 Decreased libido: Secondary | ICD-10-CM

## 2012-05-12 DIAGNOSIS — F39 Unspecified mood [affective] disorder: Secondary | ICD-10-CM

## 2012-05-12 DIAGNOSIS — F3189 Other bipolar disorder: Secondary | ICD-10-CM

## 2012-05-12 MED ORDER — CARBAMAZEPINE ER 300 MG PO CP12: 300.0000 mg | ORAL_CAPSULE | Freq: Two times a day (BID) | ORAL | Status: DC

## 2012-05-12 MED ORDER — CYPROHEPTADINE HCL 4 MG PO TABS: 4.0000 mg | ORAL_TABLET | ORAL | Status: DC | PRN

## 2012-05-12 MED ORDER — ARIPIPRAZOLE 5 MG PO TABS: 5.0000 mg | ORAL_TABLET | Freq: Every day | ORAL | Status: DC

## 2012-05-12 NOTE — Patient Instructions (Signed)
CUT BACK/CUT OUT on sugar and carbohydrates, that means very limited fruits and starchy vegetables and very limited grains, breads  The goal is low GLYCEMIC INDEX.  CUT OUT all wheat, rye, or barley for the gluten in them.  HIGH fat and LOW carbohydrate diet is the KEY.  Eat avocados, eggs, lean meat like grass fed beef and chicken  Nuts and seeds would be good foods as well.   Stevia is an excellent sweetener.  Safe for the brain.   Almond butter is awesome.  Yoga is a very helpful exercise method.  On TV or on line [employer] is a source of high quality information about yoga and videos on yoga.  [name] is the world's number one video yoga instructor according to some experts.  There are exceptional health benefits that can be achieved through yoga.  The main principles of yoga is acceptance, no competition, no comparison, and no judgement.  It is exceptional in helping people meditate and get to a very relaxed state.

## 2012-05-12 NOTE — Progress Notes (Signed)
[name] [id number] 52 y.o.  Date: 05/12/2012 2:43 PM  Chief complaint Chief Complaint  Patient presents with  . Depression  . Follow-up  . Medication Refill   Subjective: "I'm doing great.  I signed up for 2 classes at the community [employer]".  History of presenting illness Patient is 52 year old Caucasian female who came for her followup appointment. Pt reports that she is compliant with the psychotropic medications with good benefit and no noticeable side effects.  She was not able to manage her symptoms on the Neurontin.  She is back on the Xanax and has decided to take better care of herself by engaging in school and focusing on lowering her body weight.  Mentioned Yoga to her.  Also mentioned cutting back on sugar and glutin as helpful for the brain and the body.  The Periactin did help the libido about 2 hours after taking it.  Current psychiatric medication Xanax given by primary care physician  Abilify 5 mg daily Tegretol 300 mg twice a day.  Her last Tegretol level was 6.6. Periactin 4 mg as needed for libido.  Lab Recent Results (from the past 8736 hour(s))  CARBAMAZEPINE LEVEL, TOTAL   Collection Time   05/18/11 11:32 AM      Component Value Range   Carbamazepine Lvl 6.6  4.0 - 12.0 ug/mL   Vitals: Wt 265 lb 6.4 oz (120.385 kg)  Past psychiatric history Patient has at least 4 psychiatric inpatient treatment due to manic episode. In the past she has been poorly compliant with her medication and followup appointment however since she is taking the Abilify and Tegretol she is pretty stable. She denies any history of suicidal attempt  Medical history Patient has diabetes mellitus arthritis and constipation. She also has laparoscopic gastric band surgery and knee surgery.  Mental status examination Patient is casually dressed and fairly groomed.  She is obese.  She maintained good eye contact.  She is calm and cooperative.  Her speech is soft clear and coherent.   Her thought process is logical.  She described her mood is anxious and her affect is mood appropriate.  She denies any active or passive suicidal thoughts or homicidal thoughts he there were no psychotic symptoms present at this time.  There were no auditory or visual hallucination.  Her attention and concentration is fair.  She's alert and oriented x3.  Her insight judgment and impulse control is okay  Assessment Axis I bipolar disorder, anger dyscontrol Axis II deferred Axis III see medical history Axis IV moderate Axis V 55-65  Plan I took her vitals.  I reviewed CC, tobacco/med/surg Hx, meds effects/ side effects, problem list, therapies and responses as well as current situation/symptoms discussed options. See orders and pt instructions for more details.

## 2012-05-19 ENCOUNTER — Ambulatory Visit (HOSPITAL_COMMUNITY): Payer: Self-pay | Admitting: Psychiatry

## 2012-06-02 ENCOUNTER — Ambulatory Visit (HOSPITAL_COMMUNITY): Payer: Self-pay | Admitting: Psychiatry

## 2012-06-21 ENCOUNTER — Ambulatory Visit (INDEPENDENT_AMBULATORY_CARE_PROVIDER_SITE_OTHER): Payer: Medicare Other | Admitting: Surgery

## 2012-06-21 ENCOUNTER — Other Ambulatory Visit (INDEPENDENT_AMBULATORY_CARE_PROVIDER_SITE_OTHER): Payer: Self-pay

## 2012-06-21 ENCOUNTER — Encounter (INDEPENDENT_AMBULATORY_CARE_PROVIDER_SITE_OTHER): Payer: Self-pay | Admitting: Surgery

## 2012-06-21 VITALS — BP 140/82 | HR 61 | Temp 98.2°F | Resp 18 | Ht 67.0 in | Wt 263.4 lb

## 2012-06-21 DIAGNOSIS — Z9884 Bariatric surgery status: Secondary | ICD-10-CM

## 2012-06-21 NOTE — Patient Instructions (Signed)

## 2012-06-21 NOTE — Progress Notes (Signed)
[name] Body mass index is 41.24 kg/(m^2).  Having regurgitation:  yes  Nocturnal reflux?  no  Amount of fill  -6.2 cc [name] said she got the flu and vomited for several days. Since then she's noted that intermittently she gets obstructed. Unfortunately the stone today increased brain imaging is closed. She needs to get an x-ray of her band orientation. The meantime I worked through 6.2 cc to give her that band medication. On her back to see Dr. Ezzard Standing in 10 days

## 2012-06-26 ENCOUNTER — Ambulatory Visit
Admission: RE | Admit: 2012-06-26 | Discharge: 2012-06-26 | Disposition: A | Discharge status: Home / Self Care | Payer: Medicare Other | Source: Ambulatory Visit | Attending: Surgery | Admitting: Surgery

## 2012-06-26 DIAGNOSIS — Z9884 Bariatric surgery status: Secondary | ICD-10-CM

## 2012-06-28 ENCOUNTER — Encounter (INDEPENDENT_AMBULATORY_CARE_PROVIDER_SITE_OTHER): Payer: Self-pay | Admitting: Surgery

## 2012-06-28 ENCOUNTER — Ambulatory Visit (INDEPENDENT_AMBULATORY_CARE_PROVIDER_SITE_OTHER): Payer: Medicare Other | Admitting: Surgery

## 2012-06-28 VITALS — BP 130/74 | HR 78 | Temp 98.2°F | Resp 18 | Ht 67.0 in | Wt 268.0 lb

## 2012-06-28 DIAGNOSIS — Z9884 Bariatric surgery status: Secondary | ICD-10-CM

## 2012-06-28 NOTE — Progress Notes (Signed)
[facility]  Ovidio Kin, MD,  FACS [address] [place]  [address] [place], [place]    [address] Phone:  [phone] FAX:  [phone]   Re:   [name] DOB:   04/03/1961 MRN:   [id number]  ASSESSMENT AND PLAN: 1.  History of lap band placement - APL - 03/19/2008  Initial weight - 288, BMI - 45.32  Last seen by Dr. Daphine [name] 06/21/2010 - he removed all the fluid  She had 1.0 cc, I added 5.5 cc for a total of 6.5 cc  To see either me or Andy back in 10 weeks.  2.  History of reflux - still bothers her 3.  Diabetes mellitus, on [place] 4.  History of sleep apnea  Not on CPAP 5.  History of anxiety and depression  Bipolar - sees Dr. Orson [name].  Sees Dr. Lolly [name]. 6.  Bilateral knee pain - right > left  Sees Dr. Danella [name] in [place] 7.  Migraines - better on Topamax  Sees Dr. Anne [name]   HISTORY OF PRESENT ILLNESS: Chief Complaint  Patient presents with  . Lap Band Fill    [name] is a 52 y.o. (DOB: [birth year])  white  female who is a patient of [name], Samantha Hurter, MD and comes to me today for follow up of lap band.  She got a viral illness, had repeated vomiting, saw Dr. Daphine [name] 06/21/2012 and her removed all her fluid.  A kUB on 06/26/2012 shows the lap band in the correct location.  She said that she has gained 5 pounds since the fluid was removed.  She is not vomiting anymore.  She knows that one problem is that she eats too fast.  We also talked about going back to the dietitian - since it has been a while since she has seen them.  We also talked about exercise.  He knees limit her.  But she can swim and use the stationary bike.  She knows that she needs to exercise 1 hour five times per week.  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  PHYSICAL EXAM: BP 130/74  Pulse 78  Temp(Src) 98.2 F (36.8 C)  Resp 18  Ht 5\' 7"  (1.702 m)  Wt 268 lb (121.564 kg)  BMI 41.96 kg/m2  General: WN obese WF who is alert  and generally healthy appearing.  HEENT: Normal. Pupils equal. Good dentition. Lungs: Clear to auscultation and symmetric breath sounds. Heart:  RRR. No murmur or rub. Abdomen: Soft.  No hernia. Normal bowel sounds.  Port in RUQ.  It is tilted cranially.  Procedure:  i Accessed her port with a Huber needle.  The port is tilted cranially.  She had 1.0 cc, I added 5.5 cc for a total of 6.5 cc.  She tolerated water and knows to go on liquids for 2 days.  DATA REVIEWED: Reviewed KUB   Ovidio Kin, MD, FACS Office:  [phone]

## 2012-06-30 ENCOUNTER — Ambulatory Visit (INDEPENDENT_AMBULATORY_CARE_PROVIDER_SITE_OTHER): Payer: Medicare Other | Admitting: Psychiatry

## 2012-06-30 DIAGNOSIS — F319 Bipolar disorder, unspecified: Secondary | ICD-10-CM

## 2012-06-30 NOTE — Patient Instructions (Signed)
Discussed orally 
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
## 2012-07-19 ENCOUNTER — Ambulatory Visit (INDEPENDENT_AMBULATORY_CARE_PROVIDER_SITE_OTHER): Payer: Medicare Other | Admitting: Psychiatry

## 2012-07-19 DIAGNOSIS — F319 Bipolar disorder, unspecified: Secondary | ICD-10-CM

## 2012-07-19 NOTE — Patient Instructions (Addendum)
Continue self-care efforts  Use visualization " mini vacation" using 5 senses - sight , hearing , touching, tasting and smelling

## 2012-07-21 ENCOUNTER — Ambulatory Visit (INDEPENDENT_AMBULATORY_CARE_PROVIDER_SITE_OTHER): Payer: Medicare Other | Admitting: Psychiatry

## 2012-07-21 ENCOUNTER — Encounter (HOSPITAL_COMMUNITY): Payer: Self-pay | Admitting: Psychiatry

## 2012-07-21 VITALS — Wt 272.8 lb

## 2012-07-21 DIAGNOSIS — F411 Generalized anxiety disorder: Secondary | ICD-10-CM

## 2012-07-21 DIAGNOSIS — F39 Unspecified mood [affective] disorder: Secondary | ICD-10-CM

## 2012-07-21 DIAGNOSIS — F319 Bipolar disorder, unspecified: Secondary | ICD-10-CM

## 2012-07-21 DIAGNOSIS — F3189 Other bipolar disorder: Secondary | ICD-10-CM

## 2012-07-21 MED ORDER — ARIPIPRAZOLE 5 MG PO TABS: 5.0000 mg | ORAL_TABLET | Freq: Every day | ORAL | Status: DC

## 2012-07-21 MED ORDER — ALPRAZOLAM 1 MG PO TABS: 1.0000 mg | ORAL_TABLET | Freq: Two times a day (BID) | ORAL | Status: DC

## 2012-07-21 MED ORDER — CARBAMAZEPINE ER 300 MG PO CP12: 300.0000 mg | ORAL_CAPSULE | Freq: Two times a day (BID) | ORAL | Status: DC

## 2012-07-21 NOTE — Patient Instructions (Signed)
Keep it up  Keep calm  Relaxation is the ultimate solution for you.  You can seek it through tub baths, bubble baths, essential oils or incense, walking or chatting with friends, listening to soft music, watching a candle burn and just letting all thoughts go and appreciating the true essence of the Creator.   Call if problems or concerns.

## 2012-07-21 NOTE — Progress Notes (Signed)
[facility] [id number] Progress Note [name] MRN: [id number] DOB: 1960-10-12 Age: 52 y.o.  Date: 07/21/2012 Start Time: 1:50 PM End Time: 2:03 PM  Chief Complaint: Chief Complaint  Patient presents with  . Depression  . Follow-up  . Medication Refill   Subjective: "I'm doing okay, better than I did 2 weeks ago".  History of presenting illness Patient is 52 year old Caucasian female who came for her followup appointment. Pt reports that she is compliant with the psychotropic medications with good benefit and no noticeable side effects.  The Periactin did help the libido about 2 hours after taking it.  All else is working pretty well.   Current psychiatric medication Xanax given by primary care physician  Abilify 5 mg daily Tegretol 300 mg twice a day.  Her last Tegretol level was 6.6. Periactin 4 mg as needed for libido.  Lab No results found for this or any previous visit (from the past 8736 hour(s)). PCP draws routine labs and nothing is emerging as of concern.  Vitals: Wt 272 lb 12.8 oz (123.741 kg)  BMI 42.72 kg/m2  Past psychiatric history Patient has at least 4 psychiatric inpatient treatment due to manic episode. In the past she has been poorly compliant with her medication and followup appointment however since she is taking the Abilify and Tegretol she is pretty stable. She denies any history of suicidal attempt  Medical history Patient has diabetes mellitus arthritis and constipation. She also has laparoscopic gastric band surgery and knee surgery.  Mental status examination Patient is casually dressed and fairly groomed.  She is obese.  She maintained good eye contact.  She is calm and cooperative.  Her speech is soft clear and coherent.  Her thought process is logical.  She described her mood is anxious and her affect is mood appropriate.  She denies any active or passive suicidal thoughts or homicidal thoughts he there were no psychotic symptoms  present at this time.  There were no auditory or visual hallucination.  Her attention and concentration is fair.  She's alert and oriented x3.  Her insight judgment and impulse control is okay  Assessment Axis I bipolar disorder, anger dyscontrol Axis II deferred Axis III see medical history Axis IV moderate Axis V 55-65  Plan/Discussion: I took her vitals.  I reviewed CC, tobacco/med/surg Hx, meds effects/ side effects, problem list, therapies and responses as well as current situation/symptoms discussed options. Continue current effective medications. See orders and pt instructions for more details.  Medical Decision Making Problem Points:  Established problem, stable/improving (1), Review of last therapy session (1) and Review of psycho-social stressors (1) Data Points:  Review or order clinical lab tests (1) Review of medication regiment & side effects (2)  I certify that outpatient services furnished can reasonably be expected to improve the patient's condition.   Orson Aloe, MD, [facility]
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
## 2012-08-03 ENCOUNTER — Encounter (INDEPENDENT_AMBULATORY_CARE_PROVIDER_SITE_OTHER): Payer: Medicare Other

## 2012-08-11 ENCOUNTER — Ambulatory Visit (HOSPITAL_COMMUNITY): Payer: Self-pay | Admitting: Psychiatry

## 2012-08-14 ENCOUNTER — Ambulatory Visit (HOSPITAL_COMMUNITY): Payer: Self-pay | Admitting: Psychiatry

## 2012-08-18 ENCOUNTER — Encounter (HOSPITAL_COMMUNITY): Payer: Self-pay | Admitting: Psychiatry

## 2012-08-18 ENCOUNTER — Ambulatory Visit (INDEPENDENT_AMBULATORY_CARE_PROVIDER_SITE_OTHER): Payer: Medicare Other | Admitting: Psychiatry

## 2012-08-18 VITALS — Wt 275.2 lb

## 2012-08-18 DIAGNOSIS — F39 Unspecified mood [affective] disorder: Secondary | ICD-10-CM

## 2012-08-18 DIAGNOSIS — F411 Generalized anxiety disorder: Secondary | ICD-10-CM

## 2012-08-18 DIAGNOSIS — R6882 Decreased libido: Secondary | ICD-10-CM

## 2012-08-18 DIAGNOSIS — F319 Bipolar disorder, unspecified: Secondary | ICD-10-CM

## 2012-08-18 DIAGNOSIS — E669 Obesity, unspecified: Secondary | ICD-10-CM

## 2012-08-18 DIAGNOSIS — F3189 Other bipolar disorder: Secondary | ICD-10-CM

## 2012-08-18 DIAGNOSIS — R454 Irritability and anger: Secondary | ICD-10-CM

## 2012-08-18 MED ORDER — ARIPIPRAZOLE 5 MG PO TABS: 5.0000 mg | ORAL_TABLET | Freq: Every day | ORAL | Status: DC

## 2012-08-18 MED ORDER — CYPROHEPTADINE HCL 4 MG PO TABS: 4.0000 mg | ORAL_TABLET | ORAL | Status: DC | PRN

## 2012-08-18 MED ORDER — ALPRAZOLAM 1 MG PO TABS: 1.0000 mg | ORAL_TABLET | Freq: Two times a day (BID) | ORAL | Status: DC

## 2012-08-18 NOTE — Progress Notes (Signed)
[facility] [address] Progress Note [name] MRN: [id number] DOB: [birth year] Age: 52 y.o.  Date: 08/18/2012 Start Time: 1:45 PM End Time: 2:03 PM  Chief Complaint: Chief Complaint  Patient presents with  . Depression  . Follow-up  . Medication Refill   Subjective: "I'm doing good". Depression 5/10 and Anxiety 6/10, where 0 is none and 10 is the worst.  Pain is 9/10 knees and back  History of presenting illness Patient is 52 year old Caucasian female who came for her followup appointment. Pt reports that she is compliant with the psychotropic medications with good benefit and no noticeable side effects.  The Periactin did help the libido about 2 hours after taking it.  All else is working pretty well.   Current psychiatric medication Xanax given by primary care physician  Abilify 5 mg daily Tegretol 300 mg twice a day.  Her last Tegretol level was 6.6. Periactin 4 mg as needed for libido.  Lab No results found for this or any previous visit (from the past 8736 hour(s)). PCP draws routine labs and nothing is emerging as of concern.  Vitals: Wt 275 lb 3.2 oz (124.83 kg)  BMI 43.09 kg/m2  Past psychiatric history Patient has at least 4 psychiatric inpatient treatment due to manic episode. In the past she has been poorly compliant with her medication and followup appointment however since she is taking the Abilify and Tegretol she is pretty stable. She denies any history of suicidal attempt  Medical history Patient has diabetes mellitus arthritis and constipation. She also has laparoscopic gastric band surgery and knee surgery.  Mental status examination Patient is casually dressed and fairly groomed.  She is obese.  She maintained good eye contact.  She is calm and cooperative.  Her speech is soft clear and coherent.  Her thought process is logical.  She described her mood is anxious and her affect is mood appropriate.  She denies any active or passive suicidal  thoughts or homicidal thoughts he there were no psychotic symptoms present at this time.  There were no auditory or visual hallucination.  Her attention and concentration is fair.  She's alert and oriented x3.  Her insight judgment and impulse control is okay  Lab Results: No results found for this or any previous visit (from the past 8736 hour(s)). PCP draws routine labs and nothing is emerging as of concern.  Assessment Axis I bipolar disorder, anger dyscontrol Axis II deferred Axis III see medical history Axis IV moderate Axis V 55-65  Plan/Discussion: I took her vitals.  I reviewed CC, tobacco/med/surg Hx, meds effects/ side effects, problem list, therapies and responses as well as current situation/symptoms discussed options. Continue current effective medications.  Discussed the high fat low carb diet for her. Walking also See orders and pt instructions for more details.  MEDICATIONS this encounter: No orders of the defined types were placed in this encounter.    Medical Decision Making Problem Points:  Established problem, stable/improving (1), Review of last therapy session (1) and Review of psycho-social stressors (1) Data Points:  Review or order clinical lab tests (1) Review of medication regiment & side effects (2)  I certify that outpatient services furnished can reasonably be expected to improve the patient's condition.   Orson Aloe, MD, [facility]

## 2012-08-18 NOTE — Patient Instructions (Addendum)
CUT BACK/CUT OUT on sugar and carbohydrates, that means very limited fruits and starchy vegetables and very limited grains, breads  The goal is low GLYCEMIC INDEX.  CUT OUT all wheat, rye, or barley for the GLUTEN in them.  HIGH fat and LOW carbohydrate diet is the KEY.  Eat avocados, eggs, lean meat like grass fed beef and chicken  Nuts and seeds would be good foods as well.   Stevia is an excellent sweetener.  Safe for the brain.   Lowella Grip is also a good safe sweetener, not the baking blend form of Truvia  Almond butter is awesome.  Check out all this on the Internet.  Dr Heber Osceola is on the Internet with some good info about this.   http://www.drperlmutter.com is where that is.  An excellent site for info on this diet is http://paleoleap.com  [employer] makes dark chocolate that is sweetened with Stevia that is safe.  Set a timer for 8 minutes and walk for that amount of time in the house or in the yard.  Mark "8" on a calendar for that day.  Do that every day this week.  Then next week increase the time to 9 minutes and then mark the calendar with a 9 for that day.  Each week increase your exercise by one minute.  Keep a record of this so you can see what progress you are making.  Do this every day, just like eating and sleeping.  It is good for pain control, depression, and for your soul/spirit.  Bring the record in for your next visit so we can talk about your effort and how you feel with the new exercise program going and working for you.  Yoga is a very helpful exercise method.  On TV, on line, or by DVD Adelfa Koh is a source of high quality information about yoga and videos on yoga.  Renee Ramus is the world's number one video yoga instructor according to some experts.  There are exceptional health benefits that can be achieved through yoga.  The main principles of yoga is acceptance, no competition, no comparison, and no judgement.  It is exceptional in helping people  meditate and get to a very relaxed state.   Take care of yourself.  No one else is standing up to do the job and only you know what you need.   GET SERIOUS about taking care of yourself.  Do the next right thing and that often means doing something to care for yourself along the lines of are you hungry, are you angry, are you lonely, are you tired, are you scared?  HALTS is what that stands for.  Call if problems or concerns.

## 2012-08-29 ENCOUNTER — Ambulatory Visit (INDEPENDENT_AMBULATORY_CARE_PROVIDER_SITE_OTHER): Payer: Medicare Other | Admitting: Psychiatry

## 2012-08-29 DIAGNOSIS — F319 Bipolar disorder, unspecified: Secondary | ICD-10-CM

## 2012-08-29 NOTE — Patient Instructions (Signed)
Discussed orally 
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
## 2012-08-31 ENCOUNTER — Encounter (INDEPENDENT_AMBULATORY_CARE_PROVIDER_SITE_OTHER): Payer: Medicare Other

## 2012-09-15 ENCOUNTER — Ambulatory Visit (INDEPENDENT_AMBULATORY_CARE_PROVIDER_SITE_OTHER): Payer: Medicare Other | Admitting: Psychiatry

## 2012-09-15 ENCOUNTER — Encounter (HOSPITAL_COMMUNITY): Payer: Self-pay | Admitting: Psychiatry

## 2012-09-15 VITALS — BP 136/86 | Ht 65.0 in | Wt 274.8 lb

## 2012-09-15 DIAGNOSIS — F411 Generalized anxiety disorder: Secondary | ICD-10-CM

## 2012-09-15 DIAGNOSIS — F319 Bipolar disorder, unspecified: Secondary | ICD-10-CM

## 2012-09-15 DIAGNOSIS — F3189 Other bipolar disorder: Secondary | ICD-10-CM

## 2012-09-15 DIAGNOSIS — R454 Irritability and anger: Secondary | ICD-10-CM

## 2012-09-15 DIAGNOSIS — E669 Obesity, unspecified: Secondary | ICD-10-CM

## 2012-09-15 DIAGNOSIS — F39 Unspecified mood [affective] disorder: Secondary | ICD-10-CM

## 2012-09-15 MED ORDER — ALPRAZOLAM 1 MG PO TABS: 1.0000 mg | ORAL_TABLET | Freq: Two times a day (BID) | ORAL | Status: DC

## 2012-09-15 MED ORDER — ARIPIPRAZOLE 5 MG PO TABS: 5.0000 mg | ORAL_TABLET | Freq: Every day | ORAL | Status: DC

## 2012-09-15 NOTE — Progress Notes (Signed)
[facility] [address] Progress Note [name] MRN: [id number] DOB: 17-Nov-1960 Age: 52 y.o.  Date: 09/15/2012 Start Time: 3:05 PM End Time: 3:35 PM  Chief Complaint: Chief Complaint  Patient presents with  . Depression  . Follow-up  . Medication Refill   Subjective: "I'm dizzy and having staring off spells and can't pay attention to what I am doing". Depression 10/10 and Anxiety 10/10, where 0 is none and 10 is the worst.  Pain is 10/10 knees and back  History of presenting illness Patient is 52 year old Caucasian female who came for her followup appointment. Pt reports that she is compliant with the psychotropic medications with good benefit and no noticeable side effects.  Pt notes confusion, memory problems, and arguing a lot.  This sounds like partial onset seizures or possibly carbamazepine toxicity.  Current psychiatric medication Xanax given by primary care physician  Abilify 5 mg daily Tegretol 300 mg twice a day.  Her last Tegretol level was 6.6. Periactin 4 mg as needed for libido. Allergies: Allergies  Allergen Reactions  . Bactrim Itching, Nausea And Vomiting and Other (See Comments)    Redness  . Neurontin (Gabapentin) Palpitations and Other (See Comments)    confused  . Codeine Nausea And Vomiting and Rash  Medical History: Past Medical History  Diagnosis Date  . Diabetes mellitus   . Migraine   . Arthritis   . Constipation   . Generalized headaches   . Diverticula of colon 2009  . Adenomatous polyp 2009  . Anxiety   . Depression   . Pelvic floor dysfunction     abnormal anorectal manometry at [facility] in 2009  . Diabetes mellitus, type II   Surgical History: Past Surgical History  Procedure Laterality Date  . Colon surgery      complicated diverticulitis requiring sigmoid resection with colostomy and subsequent takedown  . Laparoscopic gastric banding  2009  . Heel spur surgery    . Abdominal hysterectomy    . Knee surgery      3  arthroscopic   . Colonoscopy  12/11/2007    Dr. Jena Gauss- marginal prep, normal rectum pancolonic diverticula, adenomatous polyp  . Tubal ligation    . Colonoscopy  08/28/2003       Wide open colonic anastomosis/Scattered diverticula noted throughout colon/ Small external hemorrhoids  . Colon resection    03/30/2002    with end-colostomy and Hartmann's pouch  . Laparoscopic salpingoopherectomy  03/30/2002  . Colostomy closure  07/10/2002  . Knee arthroscopy  02/04/2004     left knee/partial medial meniscectomy.  . Flexible sigmoidoscopy  01/20/2012    Procedure: FLEXIBLE SIGMOIDOSCOPY;  Surgeon: Corbin Ade, MD;  [facility]: [facility];  Service: Endoscopy;  Laterality: N/A;  Family History: family history includes ADD / ADHD in her son; Alcohol abuse in her father; Anxiety disorder in her mother and sister; Breast cancer in her other; Cirrhosis in her father; Colon cancer in her other; Dementia in her maternal grandfather; Drug abuse in her cousin; Heart disease in her mother; Liver cancer in her cousin; and Ovarian cancer in her mother.  There is no history of Bipolar disorder, and Depression, and OCD, and Paranoid behavior, and Schizophrenia, and Seizures, and Sexual abuse, and Physical abuse, . Reviewed all above today in this appointment.  Vitals: BP 136/86  Ht 5\' 5"  (1.651 m)  Wt 274 lb 12.8 oz (124.648 kg)  BMI 45.73 kg/m2 Has lost a few ounces from the last appointment.  Past psychiatric  history Patient has at least 4 psychiatric inpatient treatment due to manic episode. In the past she has been poorly compliant with her medication and followup appointment however since she is taking the Abilify and Tegretol she is pretty stable. She denies any history of suicidal attempt  Mental status examination Patient is casually dressed and fairly groomed.  She is obese.  She maintained good eye contact.  She is calm and cooperative.  Her speech is soft clear and coherent.  Her thought  process is logical.  She described her mood is anxious and her affect is mood appropriate.  She denies any active or passive suicidal thoughts or homicidal thoughts he there were no psychotic symptoms present at this time.  There were no auditory or visual hallucination.  Her attention and concentration is fair.  She's alert and oriented x3.  Her insight judgment and impulse control is okay  Lab Results: No results found for this or any previous visit (from the past 8736 hour(s)). PCP draws routine labs and nothing is emerging as of concern.  Assessment Axis I bipolar disorder, anger dyscontrol Axis II deferred Axis III see medical history Axis IV moderate Axis V 55-65  Plan/Discussion: I took her vitals.  I reviewed CC, tobacco/med/surg Hx, meds effects/ side effects, problem list, therapies and responses as well as current situation/symptoms discussed options. Continue current effective medications. Get carbamazepine level and discuss with neurologist See orders and pt instructions for more details.  MEDICATIONS this encounter: Meds ordered this encounter  Medications  . ARIPiprazole (ABILIFY) 5 MG tablet    Sig: Take 1 tablet (5 mg total) by mouth daily.    Dispense:  30 tablet    Refill:  2  . ALPRAZolam (XANAX) 1 MG tablet    Sig: Take 1 tablet (1 mg total) by mouth 2 (two) times daily. For anxiety    Dispense:  30 tablet    Refill:  0    Medical Decision Making Problem Points:  Established problem, stable/improving (1), Review of last therapy session (1) and Review of psycho-social stressors (1) Data Points:  Review or order clinical lab tests (1) Review of medication regiment & side effects (2)  I certify that outpatient services furnished can reasonably be expected to improve the patient's condition.   Orson Aloe, MD, [facility]

## 2012-09-15 NOTE — Patient Instructions (Signed)
Get carbamazepine level 12 hours after the last dose.  Check with Neurologist about what dose you need of the Carbamazepine in face of the confusion, memory problems, getting words backwards, and stroke like symptoms of changes in vision or hearing and then it comes back.  Take care of yourself.  No one else is standing up to do the job and only you know what you need.   GET SERIOUS about taking care of yourself.  Do the next right thing and that often means doing something to care for yourself along the lines of are you hungry, are you angry, are you lonely, are you tired, are you scared?  HALTS is what that stands for.  Call if problems or concerns.

## 2012-09-18 ENCOUNTER — Ambulatory Visit (INDEPENDENT_AMBULATORY_CARE_PROVIDER_SITE_OTHER): Payer: Medicare Other | Admitting: Psychiatry

## 2012-09-18 DIAGNOSIS — F319 Bipolar disorder, unspecified: Secondary | ICD-10-CM

## 2012-09-18 NOTE — Patient Instructions (Addendum)
Improve self-care - eat healthy, increase physical activity ([employer]  within your capability) improve sleep hygiene ( something relaxing and pleasant - music, "mini vacation" ) use relaxation breathing

## 2012-09-20 LAB — CARBAMAZEPINE LEVEL, TOTAL: Carbamazepine Lvl: 7.5 ug/mL (ref 4.0–12.0)

## 2012-09-21 ENCOUNTER — Ambulatory Visit (INDEPENDENT_AMBULATORY_CARE_PROVIDER_SITE_OTHER): Payer: Medicare Other | Admitting: Nurse Practitioner

## 2012-09-21 ENCOUNTER — Encounter: Payer: Self-pay | Admitting: Nurse Practitioner

## 2012-09-21 VITALS — BP 130/80 | HR 78 | Ht 66.0 in | Wt 280.0 lb

## 2012-09-21 DIAGNOSIS — R413 Other amnesia: Secondary | ICD-10-CM | POA: Insufficient documentation

## 2012-09-21 DIAGNOSIS — G43019 Migraine without aura, intractable, without status migrainosus: Secondary | ICD-10-CM | POA: Insufficient documentation

## 2012-09-21 MED ORDER — RIZATRIPTAN BENZOATE 10 MG PO TBDP: 10.0000 mg | ORAL_TABLET | ORAL | Status: DC | PRN

## 2012-09-21 NOTE — Progress Notes (Signed)
I have read the note, and I agree with the clinical assessment and plan.  
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
## 2012-09-21 NOTE — Patient Instructions (Addendum)
Constipation 1.- 1 cup of bran, 1 cup of applesauce in 1 cup of prune juice, mix together, makes a paste 2.  Increase fiber intake (fruits,vegetables) 3.  Regular, moderate exercise can be beneficial. 4.  Avoid medications causing constipation, such as medications like antacids with calcium or magnesium 5.  Laxative overuse should be avoided. 6.  Stool softeners (Colace) can help with chronic constipation.  Decrease Topamax to 100mg  daily today and call in 10 days to let me know how your memory is doing FU 2 months

## 2012-09-21 NOTE — Progress Notes (Signed)
HPI: Patient returns for followup after last visit with Dr. Anne Hahn 03/07/2012. She has history of migraine headaches. In addition she is bipolar, has history of diabetes and morbid obesity. When last seen her Topamax was increased to 150 mg daily because her headaches were  approximately 3 times a week however since that time she is complaining of memory loss however she has not called into the office. She has a mild headache today, she reports that she can have dizziness and nausea with her headaches. She has Maxalt to take acutely but has not taken any recently.  She is also suffering from arthritis in the knees and gets injections alternating every month.  last MRI of the brain in the fall of 2012 was normal.  ROS:  Multiple complaints on review of systems however her biggest complaint is  Memory loss.  Physical Exam General: well developed, well nourished, seated, in no evident distress Head: head normocephalic and atraumatic. Oropharynx benign Neck: supple with no carotid  bruits Cardiovascular: regular rate and rhythm, no murmurs  Neurologic Exam Mental Status: Awake and  alert. MOCA testing 21/30 which is abnormal. She was unable to do serial sevens but she did not miss any recall items. She is upset, anxious, and crying through most of the exam. Cranial Nerves:. Pupils equal, briskly reactive to light. Extraocular movements full without nystagmus. Visual fields full to confrontation. Hearing intact and symmetric to finger snap. Facial sensation intact. Face, tongue, palate move normally and symmetrically.  Motor: Normal bulk and tone. Normal strength in all tested extremity muscles. Sensory.: intact to touch and pinprick and vibratory in all extremities.   Coordination: Rapid alternating movements normal in all extremities. Finger-to-nose and heel-to-shin performed accurately bilaterally. Gait and Station: Arises from chair without difficulty. Stance is wide based.  Able to heel, toe and  mildly unsteady with tandem walk.  Reflexes: 2+ and symmetric. Toes downgoing.     ASSESSMENT: Common migraine currently on Topamax 150 with complaints of memory loss which could be due to the Topamax or her uncontrolled bipolar disorder. MRI of the brain  in the fall of 2012 was normal  Diabetes which has been controlled according to patient,  Bipolar disease which is not in good control according to patient. She is currently seeing her counselor every week to 2 weeks and just saw her psychiatrist this past Friday.      PLAN: Reduce Topamax 100 mg total dose daily and call me in 10 days for further reduction if necessary She will followup in 2 months In reviewing psych note from 09/15/12 CBZ level was checked and within normal range. Depression scale score was at the max.   Nilda Riggs, GNP-BC APRN

## 2012-09-26 ENCOUNTER — Other Ambulatory Visit: Payer: Self-pay

## 2012-09-26 MED ORDER — TOPIRAMATE 50 MG PO TABS: 50.0000 mg | ORAL_TABLET | Freq: Two times a day (BID) | ORAL | Status: DC

## 2012-09-28 ENCOUNTER — Encounter (INDEPENDENT_AMBULATORY_CARE_PROVIDER_SITE_OTHER): Payer: Medicare Other

## 2012-09-29 ENCOUNTER — Ambulatory Visit (HOSPITAL_COMMUNITY): Payer: Self-pay | Admitting: Psychiatry

## 2012-10-03 ENCOUNTER — Ambulatory Visit (INDEPENDENT_AMBULATORY_CARE_PROVIDER_SITE_OTHER): Payer: Medicare Other | Admitting: Psychiatry

## 2012-10-03 ENCOUNTER — Telehealth (HOSPITAL_COMMUNITY): Payer: Self-pay | Admitting: Psychiatry

## 2012-10-03 ENCOUNTER — Telehealth: Payer: Self-pay | Admitting: *Deleted

## 2012-10-03 DIAGNOSIS — F319 Bipolar disorder, unspecified: Secondary | ICD-10-CM

## 2012-10-03 NOTE — Telephone Encounter (Signed)
Called pt and spoke with her that I have samples that she could have.    She will drop by tomorrow and pick them up.
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
## 2012-10-06 ENCOUNTER — Encounter (HOSPITAL_COMMUNITY): Payer: Self-pay | Admitting: Psychiatry

## 2012-10-06 ENCOUNTER — Ambulatory Visit (INDEPENDENT_AMBULATORY_CARE_PROVIDER_SITE_OTHER): Payer: Medicare Other | Admitting: Psychiatry

## 2012-10-06 VITALS — BP 110/70 | Ht 65.5 in | Wt 280.6 lb

## 2012-10-06 DIAGNOSIS — F319 Bipolar disorder, unspecified: Secondary | ICD-10-CM

## 2012-10-06 DIAGNOSIS — E669 Obesity, unspecified: Secondary | ICD-10-CM

## 2012-10-06 DIAGNOSIS — F39 Unspecified mood [affective] disorder: Secondary | ICD-10-CM

## 2012-10-06 DIAGNOSIS — R454 Irritability and anger: Secondary | ICD-10-CM

## 2012-10-06 DIAGNOSIS — F411 Generalized anxiety disorder: Secondary | ICD-10-CM

## 2012-10-06 MED ORDER — CARBAMAZEPINE ER 200 MG PO CP12: 200.0000 mg | ORAL_CAPSULE | Freq: Two times a day (BID) | ORAL | Status: DC

## 2012-10-06 MED ORDER — ALPRAZOLAM 1 MG PO TABS: 1.0000 mg | ORAL_TABLET | Freq: Two times a day (BID) | ORAL | Status: DC

## 2012-10-06 NOTE — Patient Instructions (Signed)
CUT BACK/CUT OUT on sugar and carbohydrates, that means very limited fruits and starchy vegetables and very limited grains, breads  The goal is low GLYCEMIC INDEX.  CUT OUT all wheat, rye, or barley for the GLUTEN in them.  HIGH fat and LOW carbohydrate diet is the KEY.  Eat avocados, eggs, lean meat like grass fed beef and chicken  Nuts and seeds would be good foods as well.   Stevia is an excellent sweetener.  Safe for the brain.   Lowella Grip is also a good safe sweetener, not the baking blend form of Truvia  Almond butter is awesome.  Check out all this on the Internet.  Dr Heber Chauvin is on the Internet with some good info about this.   http://www.drperlmutter.com is where that is.  An excellent site for info on this diet is http://paleoleap.com  [employer] makes dark chocolate that is sweetened with Stevia that is safe.  William Dalton is a soda sweetened with Stevia and is available at [employer] among other places.  Try increasing the Equetro and if that goes the wrong direction then try less.  You are at 600 a day, try 700 a day or even 800 a day, if need to cut back then try 400 a day.  Take care of yourself.  No one else is standing up to do the job and only you know what you need.   GET SERIOUS about taking care of yourself.  Do the next right thing and that often means doing something to care for yourself along the lines of are you hungry, are you angry, are you lonely, are you tired, are you scared?  HALTS is what that stands for.  Call if problems or concerns.

## 2012-10-06 NOTE — Progress Notes (Signed)
[facility] [address] Progress Note EMRYS MCEACHRON MRN: [id number] DOB: 04-05-1961 Age: 52 y.o.  Date: 10/06/2012 Start Time: 2:15 PM End Time: 2:35 PM  Chief Complaint: Chief Complaint  Patient presents with  . Depression  . Follow-up  . Medication Refill   Subjective: "I' can't function well outside of my house talking backwards, forgetting words, staying upset and mad staring off, can't concentrate, paranoid.". Depression 10/10 and Anxiety 10/10, where 0 is none and 10 is the worst.  Pain is 10/10 knees and back  History of presenting illness Patient is 52 year old Caucasian female who came for her followup appointment. Pt reports that she is compliant with the psychotropic medications with good benefit and no noticeable side effects.  Pt notes confusion, memory problems, and arguing a lot.  This sounds like partial onset seizures or possibly carbamazepine toxicity.  Current psychiatric medication Xanax given by primary care physician  Abilify 5 mg daily Tegretol 300 mg twice a day.  Her last Tegretol level was 7.5 Periactin 4 mg as needed for libido. Allergies: Allergies  Allergen Reactions  . Bactrim Itching, Nausea And Vomiting and Other (See Comments)    Redness  . Neurontin (Gabapentin) Palpitations and Other (See Comments)    confused  . Codeine Nausea And Vomiting and Rash  Medical History: Past Medical History  Diagnosis Date  . Diabetes mellitus   . Migraine   . Arthritis   . Constipation   . Generalized headaches   . Diverticula of colon 2009  . Adenomatous polyp 2009  . Anxiety   . Depression   . Pelvic floor dysfunction     abnormal anorectal manometry at [facility] in 2009  . Diabetes mellitus, type II   Surgical History: Past Surgical History  Procedure Laterality Date  . Colon surgery      complicated diverticulitis requiring sigmoid resection with colostomy and subsequent takedown  . Laparoscopic gastric banding  2009  . Heel spur surgery     . Abdominal hysterectomy    . Knee surgery      3 arthroscopic   . Colonoscopy  12/11/2007    Dr. Jena Gauss- marginal prep, normal rectum pancolonic diverticula, adenomatous polyp  . Tubal ligation    . Colonoscopy  08/28/2003       Wide open colonic anastomosis/Scattered diverticula noted throughout colon/ Small external hemorrhoids  . Colon resection    03/30/2002    with end-colostomy and Hartmann's pouch  . Laparoscopic salpingoopherectomy  03/30/2002  . Colostomy closure  07/10/2002  . Knee arthroscopy  02/04/2004     left knee/partial medial meniscectomy.  . Flexible sigmoidoscopy  01/20/2012    Procedure: FLEXIBLE SIGMOIDOSCOPY;  Surgeon: Corbin Ade, MD;  [facility];  Service: Endoscopy;  Laterality: N/A;  Family History: family history includes ADD / ADHD in her son; Alcohol abuse in her father; Anxiety disorder in her mother and sister; Breast cancer in her other; Cirrhosis in her father; Colon cancer in her other; Dementia in her maternal grandfather; Drug abuse in her cousin; Heart disease in her mother; Liver cancer in her cousin; and Ovarian cancer in her mother.  There is no history of Bipolar disorder, and Depression, and OCD, and Paranoid behavior, and Schizophrenia, and Seizures, and Sexual abuse, and Physical abuse, . Reviewed all above today in this appointment and no changes  Vitals: BP 110/70  Ht 5' 5.5" (1.664 m)  Wt 280 lb 9.6 oz (127.279 kg)  BMI 45.97 kg/m2  Past psychiatric history  Patient has at least 4 psychiatric inpatient treatment due to manic episode. In the past she has been poorly compliant with her medication and followup appointment however since she is taking the Abilify and Tegretol she is pretty stable. She denies any history of suicidal attempt  Mental status examination Patient is casually dressed and fairly groomed.  She is obese.  She maintained good eye contact.  She is calm and cooperative.  Her speech is soft clear and coherent.   Her thought process is logical.  She described her mood is anxious and her affect is mood appropriate.  She denies any active or passive suicidal thoughts or homicidal thoughts he there were no psychotic symptoms present at this time.  There were no auditory or visual hallucination.  Her attention and concentration is fair.  She's alert and oriented x3.  Her insight judgment and impulse control is okay  Lab Results:  Results for orders placed in visit on 09/15/12 (from the past 8736 hour(s))  CARBAMAZEPINE LEVEL, TOTAL   Collection Time    09/19/12  8:15 AM      Result Value Range   Carbamazepine Lvl 7.5  4.0 - 12.0 ug/mL   PCP draws routine labs and nothing is emerging as of concern.  Assessment Axis I bipolar disorder, anger dyscontrol Axis II deferred Axis III see medical history Axis IV moderate Axis V 55-65  Plan/Discussion: I took her vitals.  I reviewed CC, tobacco/med/surg Hx, meds effects/ side effects, problem list, therapies and responses as well as current situation/symptoms discussed options. Tyr increase and if not helpful lower dose of Equetro See orders and pt instructions for more details.  MEDICATIONS this encounter: No orders of the defined types were placed in this encounter.    Medical Decision Making Problem Points:  Established problem, stable/improving (1), Review of last therapy session (1) and Review of psycho-social stressors (1) Data Points:  Review or order clinical lab tests (1) Review of medication regiment & side effects (2) Review of new medications or change in dosage (2)  I certify that outpatient services furnished can reasonably be expected to improve the patient's condition.   Orson Aloe, MD, [facility]

## 2012-10-09 ENCOUNTER — Encounter: Payer: Self-pay | Admitting: *Deleted

## 2012-10-10 ENCOUNTER — Ambulatory Visit (INDEPENDENT_AMBULATORY_CARE_PROVIDER_SITE_OTHER): Payer: Medicare Other | Admitting: Obstetrics & Gynecology

## 2012-10-10 ENCOUNTER — Encounter: Payer: Self-pay | Admitting: Obstetrics & Gynecology

## 2012-10-10 VITALS — BP 130/80 | Ht 66.0 in | Wt 279.0 lb

## 2012-10-10 DIAGNOSIS — Z124 Encounter for screening for malignant neoplasm of cervix: Secondary | ICD-10-CM

## 2012-10-10 DIAGNOSIS — Z01419 Encounter for gynecological examination (general) (routine) without abnormal findings: Secondary | ICD-10-CM

## 2012-10-10 MED ORDER — ESTROGENS, CONJUGATED 0.625 MG/GM VA CREA: TOPICAL_CREAM | Freq: Every day | VAGINAL | Status: DC

## 2012-10-11 NOTE — Progress Notes (Signed)
Patient ID: [name], female   DOB: 1960/10/24, 52 y.o.   MRN: [id number] Subjective:     [name] is a 52 y.o. female here for a routine exam.  No LMP recorded. Patient has had a hysterectomy. G2P2 Current complaints: none.  Personal health questionnaire reviewed: no.   Gynecologic History No LMP recorded. Patient has had a hysterectomy. Contraception: status post hysterectomy Last Pap: na. Results were: na Last mammogram: 2014. Results were: normal  Obstetric History OB History   Grav Para Term Preterm Abortions TAB SAB Ect Mult Living   2 2        2      # Outc Date GA Lbr Len/2nd Wgt Sex Del Anes PTL Lv   1 PAR      SVD      2 PAR      SVD          The following portions of the patient's history were reviewed and updated as appropriate: allergies, current medications, past family history, past medical history, past social history, past surgical history and problem list.  Review of Systems  Review of Systems  Constitutional: Negative for fever, chills, weight loss, malaise/fatigue and diaphoresis.  HENT: Negative for hearing loss, ear pain, nosebleeds, congestion, sore throat, neck pain, tinnitus and ear discharge.   Eyes: Negative for blurred vision, double vision, photophobia, pain, discharge and redness.  Respiratory: Negative for cough, hemoptysis, sputum production, shortness of breath, wheezing and stridor.   Cardiovascular: Negative for chest pain, palpitations, orthopnea, claudication, leg swelling and PND.  Gastrointestinal: negative for abdominal pain. Negative for heartburn, nausea, vomiting, diarrhea, constipation, blood in stool and melena.  Genitourinary: Negative for dysuria, urgency, frequency, hematuria and flank pain.  Musculoskeletal: Negative for myalgias, back pain, joint pain and falls.  Skin: Negative for itching and rash.  Neurological: Negative for dizziness, tingling, tremors, sensory change, speech change, focal weakness, seizures,  loss of consciousness, weakness and headaches.  Endo/Heme/Allergies: Negative for environmental allergies and polydipsia. Does not bruise/bleed easily.  Psychiatric/Behavioral: Negative for depression, suicidal ideas, hallucinations, memory loss and substance abuse. The patient is not nervous/anxious and does not have insomnia.        Objective:    Physical Exam  Vitals reviewed. Constitutional: She is oriented to person, place, and time. She appears well-developed and well-nourished.  HENT:  Head: Normocephalic and atraumatic.        Right Ear: External ear normal.  Left Ear: External ear normal.  Nose: Nose normal.  Mouth/Throat: Oropharynx is clear and moist.  Eyes: Conjunctivae and EOM are normal. Pupils are equal, round, and reactive to light. Right eye exhibits no discharge. Left eye exhibits no discharge. No scleral icterus.  Neck: Normal range of motion. Neck supple. No tracheal deviation present. No thyromegaly present.  Cardiovascular: Normal rate, regular rhythm, normal heart sounds and intact distal pulses.  Exam reveals no gallop and no friction rub.   No murmur heard. Respiratory: Effort normal and breath sounds normal. No respiratory distress. She has no wheezes. She has no rales. She exhibits no tenderness.  GI: Soft. Bowel sounds are normal. She exhibits no distension and no mass. There is no tenderness. There is no rebound and no guarding.  Genitourinary:       Vulva is normal without lesions Vagina is pink moist without discharge Cervix absent Uterus is absent Adnexa is absent Musculoskeletal: Normal range of motion. She exhibits no edema and no tenderness.  Neurological: She is alert and oriented  to person, place, and time. She has normal reflexes. She displays normal reflexes. No cranial nerve deficit. She exhibits normal muscle tone. Coordination normal.  Skin: Skin is warm and dry. No rash noted. No erythema. No pallor.  Psychiatric: She has a normal mood and  affect. Her behavior is normal. Judgment and thought content normal.       Assessment:    Healthy female exam.    Plan:    Follow up in: 1 year.

## 2012-10-12 ENCOUNTER — Ambulatory Visit (INDEPENDENT_AMBULATORY_CARE_PROVIDER_SITE_OTHER): Payer: Medicare Other | Admitting: Physician Assistant

## 2012-10-12 ENCOUNTER — Encounter (INDEPENDENT_AMBULATORY_CARE_PROVIDER_SITE_OTHER): Payer: Self-pay | Admitting: Physician Assistant

## 2012-10-12 VITALS — BP 132/78 | HR 68 | Temp 98.0°F | Resp 18 | Ht 67.0 in | Wt 277.0 lb

## 2012-10-12 DIAGNOSIS — Z4651 Encounter for fitting and adjustment of gastric lap band: Secondary | ICD-10-CM

## 2012-10-12 NOTE — Progress Notes (Signed)
  HISTORY: [name] is a 52 y.o.female who received an AP-Large lap-band in November 2009 by Dr. Ezzard Standing. She comes in having last been seen in February and she's gained 11 lbs. She is only 11 lbs down from her pre-op weight from 2009. She complains of eating too much, being hungry and eating the wrong things. She denies regurgitation or reflux. She had a lap band holiday in early February for which Dr. Ezzard Standing replaced some fluid later that month. She complains of weight-related knee pain that restricts her activity. She wants an adjustment today.  VITAL SIGNS: Filed Vitals:   10/12/12 1425  BP: 132/78  Pulse: 68  Temp: 98 F (36.7 C)  Resp: 18    PHYSICAL EXAM: Physical exam reveals a very well-appearing 52 y.o.female in no apparent distress Neurologic: Awake, alert, oriented Psych: Bright affect, conversant Respiratory: Breathing even and unlabored. No stridor or wheezing Abdomen: Soft, nontender, nondistended to palpation. Incisions well-healed. No incisional hernias. Port easily palpated. Extremities: Atraumatic, good range of motion.  ASSESMENT: 52 y.o.  female  s/p AP-Large lap-band.   PLAN: The patient's port was accessed with a 20G Huber needle without difficulty. Clear fluid was aspirated and 0.5 mL saline was added to the port to give a total predicted volume of 7 mL. The patient was able to swallow water without difficulty following the procedure and was instructed to take clear liquids for the next 24-48 hours and advance slowly as tolerated. We discussed concentrating on lean protein as her first food item at meals and avoiding carbohydrates altogether. She voiced understanding and agreement. We'll see her back in one month.

## 2012-10-12 NOTE — Patient Instructions (Signed)
Take clear liquids tonight. Thin protein shakes are ok to start tomorrow morning. Slowly advance your diet thereafter. Call us if you have persistent vomiting or regurgitation, night cough or reflux symptoms. Return as scheduled or sooner if you notice no changes in hunger/portion sizes.  

## 2012-10-17 ENCOUNTER — Ambulatory Visit (INDEPENDENT_AMBULATORY_CARE_PROVIDER_SITE_OTHER): Payer: Medicare Other | Admitting: Psychiatry

## 2012-10-17 ENCOUNTER — Telehealth (HOSPITAL_COMMUNITY): Payer: Self-pay | Admitting: Psychiatry

## 2012-10-17 DIAGNOSIS — F319 Bipolar disorder, unspecified: Secondary | ICD-10-CM

## 2012-10-17 NOTE — Progress Notes (Signed)
Patient:  [name]   DOB: [birth year]  MR Number: [id number]  Location: [facility]:  [address] [place],  [place], [address]  Start: Tuesday 10/17/2012 10:00 AM End: Tuesday 10/17/2012 10:50 AM  Provider/Observer:     Florencia Reasons, MSW, LCSW   Chief Complaint:      Chief Complaint  Patient presents with  . Depression  . Anxiety    Reason For Service:     Patient has a long-standing history of bipolar disorder and is experiencing increased stress and mood swings. Patient is seen for al follow-up appointment.  Interventions Strategy:  Supportive therapy, cognitive behavioral therapy  Participation Level:   Active  Participation Quality:  Appropriate      Behavioral Observation:  Casual, talkative  Current Psychosocial Factors: Patient reports continued marital stress, health concerns, and increased concerns regarding her son who is experiencing emotional and behavioral issues.  Content of Session:   Reviewing symptoms, processing feelings, identifying realistic expectations of self, reframing negative thinking ,identifying ways to increase activity and improve routine and structure,   Current Status:   Patient reports continued depressed mood, crying spells, anxiety, memory difficulty, decreased interest in activities, irritability, paranoia, and sleep difficulty (4 hours of sleep per night).   Patient Progress:   Poor. The patient reports no change in symptoms since last session. She expresses frustration due to to chronic pain in her knees and legs. She also is experiencing vertigo. She reports staying in bed all day Sunday but did force herself to stay out of bed yesterday and experienced some improvement in mood. Therapist works with patient regarding activity scheduling. Patient expresses frustration that she cannot perform more household tasks. Therapist works with patient to identify realistic expectations of self as well as identify areas within patient's  control. Patient is worried about son who has anger issues but is pleased he has consented to go with her to see a doctor.   Target Goals:    Reducing anxiety  Last Reviewed:    Goals Addressed Today:    Reducing anxiety  Impression/Diagnosis:   The patient presents with a long-standing history of bipolar disorder and currently is experiencing increased anxiety, worry, mood swings, anger outbursts, and irritability. Diagnosis: bipolar disorder  Diagnosis:  Axis I:  Bipolar disorder          Axis II: Deferred

## 2012-10-17 NOTE — Patient Instructions (Addendum)
Stay active within your capability -- fold laundry, throw ball for dog, exercises for upper body, reading, do memory games, word find puzzles

## 2012-10-17 NOTE — Telephone Encounter (Signed)
Pt called back and reported improvement with dizziness on the lower dose.

## 2012-10-18 NOTE — Telephone Encounter (Signed)
Pt notes less dizziness on the lower dose and has a sufficient supply to last until appointment on Jun 27th.

## 2012-10-31 ENCOUNTER — Ambulatory Visit (INDEPENDENT_AMBULATORY_CARE_PROVIDER_SITE_OTHER): Payer: Medicare Other | Admitting: Psychiatry

## 2012-10-31 DIAGNOSIS — F319 Bipolar disorder, unspecified: Secondary | ICD-10-CM

## 2012-10-31 NOTE — Patient Instructions (Addendum)
Use plan your day handouts  Use a relaxation technique daily  Continue self-care efforts regarding nutrition and exercises within your capability.
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
## 2012-11-03 ENCOUNTER — Encounter (HOSPITAL_COMMUNITY): Payer: Self-pay | Admitting: Psychiatry

## 2012-11-03 ENCOUNTER — Ambulatory Visit (INDEPENDENT_AMBULATORY_CARE_PROVIDER_SITE_OTHER): Payer: Medicare Other | Admitting: Psychiatry

## 2012-11-03 VITALS — BP 135/80 | Ht 65.25 in | Wt 280.6 lb

## 2012-11-03 DIAGNOSIS — R6882 Decreased libido: Secondary | ICD-10-CM

## 2012-11-03 DIAGNOSIS — F411 Generalized anxiety disorder: Secondary | ICD-10-CM

## 2012-11-03 DIAGNOSIS — F3189 Other bipolar disorder: Secondary | ICD-10-CM

## 2012-11-03 DIAGNOSIS — E669 Obesity, unspecified: Secondary | ICD-10-CM

## 2012-11-03 DIAGNOSIS — F39 Unspecified mood [affective] disorder: Secondary | ICD-10-CM

## 2012-11-03 DIAGNOSIS — R454 Irritability and anger: Secondary | ICD-10-CM

## 2012-11-03 DIAGNOSIS — F319 Bipolar disorder, unspecified: Secondary | ICD-10-CM

## 2012-11-03 MED ORDER — CYPROHEPTADINE HCL 4 MG PO TABS: 4.0000 mg | ORAL_TABLET | ORAL | Status: DC | PRN

## 2012-11-03 MED ORDER — ARIPIPRAZOLE 10 MG PO TABS: 10.0000 mg | ORAL_TABLET | Freq: Every day | ORAL | Status: DC

## 2012-11-03 MED ORDER — CARBAMAZEPINE ER 200 MG PO CP12: 200.0000 mg | ORAL_CAPSULE | Freq: Two times a day (BID) | ORAL | Status: DC

## 2012-11-03 NOTE — Progress Notes (Signed)
[facility] [address] Progress Note [name] MRN: [id number] DOB: [birth year] Age: 52 y.o.  Date: 11/03/2012 Start Time: 2:08 PM End Time: 2:38 PM  Chief Complaint: Chief Complaint  Patient presents with  . Depression  . Follow-up  . Medication Refill   Subjective: "I am not dong very well.  I'm so nervous about leaving th house.". Due to spending a lot of time in bed and only doing what she has to do. Depression 10/10 and Anxiety 10/10, where 0 is none and 10 is the worst.  Pain is 10/10 knees and back  History of presenting illness Patient is 52 year old Caucasian female who came for her followup appointment. Pt reports that she is compliant with the psychotropic medications with good benefit and no noticeable side effects.  She tried the higher dose and the lower dose and liked the lower dose better.  Less dizziness or blurred vision. She notes more calmness with the Equetro. She is having a good day today.  Her dose of Abilify could be causing some anxiety, will try a slightly higher dose for the benefit observed. Instructed pt to try 10 and even 15 mg to see what is the best dose for her.  Current psychiatric medication Xanax given by primary care physician  Abilify 5 mg daily Equetro 200 mg twice a day.  Periactin 4 mg as needed for libido and it works  Vitals: BP 135/80  Ht 5' 5.25" (1.657 m)  Wt 280 lb 9.6 oz (127.279 kg)  BMI 46.36 kg/m2  Allergies: Allergies  Allergen Reactions  . Bactrim Itching, Nausea And Vomiting and Other (See Comments)    Redness  . Neurontin (Gabapentin) Palpitations and Other (See Comments)    confused  . Codeine Nausea And Vomiting and Rash  Medical History: Past Medical History  Diagnosis Date  . Diabetes mellitus   . Migraine   . Arthritis   . Constipation   . Generalized headaches   . Diverticula of colon 2009  . Adenomatous polyp 2009  . Anxiety   . Depression   . Pelvic floor dysfunction     abnormal  anorectal manometry at [facility] in 2009  . Diabetes mellitus, type II   . Psoriasis   . Bipolar 1 disorder   Surgical History: Past Surgical History  Procedure Laterality Date  . Colon surgery      complicated diverticulitis requiring sigmoid resection with colostomy and subsequent takedown  . Laparoscopic gastric banding  2009  . Heel spur surgery    . Abdominal hysterectomy    . Knee surgery      3 arthroscopic   . Colonoscopy  12/11/2007    Dr. Jena Gauss- marginal prep, normal rectum pancolonic diverticula, adenomatous polyp  . Tubal ligation    . Colonoscopy  08/28/2003       Wide open colonic anastomosis/Scattered diverticula noted throughout colon/ Small external hemorrhoids  . Colon resection    03/30/2002    with end-colostomy and Hartmann's pouch  . Laparoscopic salpingoopherectomy  03/30/2002  . Colostomy closure  07/10/2002  . Knee arthroscopy  02/04/2004     left knee/partial medial meniscectomy.  . Flexible sigmoidoscopy  01/20/2012    Procedure: FLEXIBLE SIGMOIDOSCOPY;  Surgeon: Corbin Ade, MD;  [facility]: [facility];  Service: Endoscopy;  Laterality: N/A;  Family History: family history includes ADD / ADHD in her son; Alcohol abuse in her father; Anxiety disorder in her mother and sister; Breast cancer in her other; Cirrhosis in her father;  Colon cancer in her other; Dementia in her maternal grandfather; Drug abuse in her cousin; Heart disease in her mother; Liver cancer in her cousin; and Ovarian cancer in her mother.  There is no history of Bipolar disorder, and Depression, and OCD, and Paranoid behavior, and Schizophrenia, and Seizures, and Sexual abuse, and Physical abuse, . Reviewed and nothing is new today.  Past psychiatric history Patient has at least 4 psychiatric inpatient treatment due to manic episode. In the past she has been poorly compliant with her medication and followup appointment however since she is taking the Abilify and Tegretol she is pretty stable.  She denies any history of suicidal attempt  Mental status examination Patient is casually dressed and fairly groomed.  She is obese.  She maintained good eye contact.  She is calm and cooperative.  Her speech is soft clear and coherent.  Her thought process is logical.  She described her mood is anxious and her affect is mood appropriate.  She denies any active or passive suicidal thoughts or homicidal thoughts he there were no psychotic symptoms present at this time.  There were no auditory or visual hallucination.  Her attention and concentration is fair.  She's alert and oriented x3.  Her insight judgment and impulse control is okay  Lab Results:  Results for orders placed in visit on 09/15/12 (from the past 8736 hour(s))  CARBAMAZEPINE LEVEL, TOTAL   Collection Time    09/19/12  8:15 AM      Result Value Range   Carbamazepine Lvl 7.5  4.0 - 12.0 ug/mL   PCP draws routine labs and nothing is emerging as of concern.  Assessment Axis I bipolar disorder, anger dyscontrol Axis II deferred Axis III see medical history Axis IV moderate Axis V 55-65  Plan/Discussion: I took her vitals.  I reviewed CC, tobacco/med/surg Hx, meds effects/ side effects, problem list, therapies and responses as well as current situation/symptoms discussed options. Continue current effective medications. See orders and pt instructions for more details.  MEDICATIONS this encounter: Meds ordered this encounter  Medications  . cyproheptadine (PERIACTIN) 4 MG tablet    Sig: Take 1 tablet (4 mg total) by mouth as needed (for the spice of life).    Dispense:  30 tablet    Refill:  2  . carbamazepine (EQUETRO) 200 MG CP12    Sig: Take 1 capsule (200 mg total) by mouth 2 (two) times daily.    Dispense:  60 each    Refill:  2  . ARIPiprazole (ABILIFY) 10 MG tablet    Sig: Take 1-1.5 tablets (10-15 mg total) by mouth daily. CALL back with the dose that works the best for you.    Dispense:  45 tablet    Refill:   2    Medical Decision Making Problem Points:  Established problem, stable/improving (1), Review of last therapy session (1) and Review of psycho-social stressors (1) Data Points:  Review or order clinical lab tests (1) Review of medication regiment & side effects (2) Review of new medications or change in dosage (2)  I certify that outpatient services furnished can reasonably be expected to improve the patient's condition.   Orson Aloe, MD, [facility]

## 2012-11-03 NOTE — Patient Instructions (Signed)
Set a timer for 8 or a certain number minutes and walk for that amount of time in the house or in the yard.  Mark the number of minutes on a calendar for that day.  Do that every day this week.  Then next week increase the time by 1 minutes and then mark the calendar with the number of minutes for that day.  Each week increase your exercise by one minute.  Keep a record of this so you can see the progress you are making.  Do this every day, just like eating and sleeping.  It is good for pain control, depression, and for your soul/spirit.  Bring the record in for your next visit so we can talk about your effort and how you feel with the new exercise program going and working for you.  Relaxation is the ultimate solution for you.  You can seek it through tub baths, bubble baths, essential oils or incense, walking or chatting with friends, listening to soft music, watching a candle burn and just letting all thoughts go and appreciating the true essence of the Creator.  Pets or animals may be very helpful.  You might spend some time with them and then go do more directed meditation.  "I am Wishes Fulfilled Meditation" by Marylene Buerger and Lyndal Pulley may be helpful MUSIC for getting to sleep or for meditating You can order it from on line.  You might find the Chill channel on Pandora and explore the artists that you like better.   Call if problems or concerns.

## 2012-11-14 ENCOUNTER — Ambulatory Visit (INDEPENDENT_AMBULATORY_CARE_PROVIDER_SITE_OTHER): Payer: Medicare Other | Admitting: Psychiatry

## 2012-11-14 DIAGNOSIS — F319 Bipolar disorder, unspecified: Secondary | ICD-10-CM
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
## 2012-11-14 NOTE — Patient Instructions (Signed)
Discussed orally 

## 2012-11-16 ENCOUNTER — Encounter (INDEPENDENT_AMBULATORY_CARE_PROVIDER_SITE_OTHER): Payer: Medicare Other

## 2012-11-29 ENCOUNTER — Ambulatory Visit (HOSPITAL_COMMUNITY): Payer: Self-pay | Admitting: Psychiatry

## 2012-11-30 ENCOUNTER — Telehealth: Payer: Self-pay

## 2012-12-13 NOTE — Telephone Encounter (Signed)
Called and returned patient's call about having migraines and management. Left VM, call if any further concerns.

## 2012-12-19 ENCOUNTER — Other Ambulatory Visit (HOSPITAL_COMMUNITY): Payer: Self-pay | Admitting: Family Medicine

## 2012-12-19 DIAGNOSIS — Z139 Encounter for screening, unspecified: Secondary | ICD-10-CM

## 2012-12-21 ENCOUNTER — Ambulatory Visit: Payer: Medicare Other | Admitting: Nurse Practitioner

## 2012-12-27 ENCOUNTER — Ambulatory Visit: Payer: Medicare Other | Admitting: Neurology

## 2012-12-28 ENCOUNTER — Ambulatory Visit (INDEPENDENT_AMBULATORY_CARE_PROVIDER_SITE_OTHER): Payer: Medicare Other | Admitting: Psychiatry

## 2012-12-28 DIAGNOSIS — F319 Bipolar disorder, unspecified: Secondary | ICD-10-CM

## 2012-12-28 NOTE — Patient Instructions (Signed)
Discussed orally 
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
## 2013-01-01 ENCOUNTER — Encounter (HOSPITAL_COMMUNITY): Payer: Self-pay | Admitting: Psychiatry

## 2013-01-01 ENCOUNTER — Ambulatory Visit (INDEPENDENT_AMBULATORY_CARE_PROVIDER_SITE_OTHER): Payer: Medicare Other | Admitting: Psychiatry

## 2013-01-01 VITALS — BP 140/92 | Ht 66.0 in | Wt 284.0 lb

## 2013-01-01 DIAGNOSIS — F3189 Other bipolar disorder: Secondary | ICD-10-CM

## 2013-01-01 DIAGNOSIS — F411 Generalized anxiety disorder: Secondary | ICD-10-CM

## 2013-01-01 DIAGNOSIS — F39 Unspecified mood [affective] disorder: Secondary | ICD-10-CM

## 2013-01-01 DIAGNOSIS — R454 Irritability and anger: Secondary | ICD-10-CM

## 2013-01-01 DIAGNOSIS — F319 Bipolar disorder, unspecified: Secondary | ICD-10-CM

## 2013-01-01 DIAGNOSIS — F1010 Alcohol abuse, uncomplicated: Secondary | ICD-10-CM

## 2013-01-01 MED ORDER — ARIPIPRAZOLE 10 MG PO TABS: 10.0000 mg | ORAL_TABLET | Freq: Every day | ORAL | Status: DC

## 2013-01-01 MED ORDER — CARBAMAZEPINE ER 200 MG PO CP12: 200.0000 mg | ORAL_CAPSULE | Freq: Two times a day (BID) | ORAL | Status: DC

## 2013-01-01 MED ORDER — ESCITALOPRAM OXALATE 20 MG PO TABS: 20.0000 mg | ORAL_TABLET | Freq: Every day | ORAL | Status: DC
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
## 2013-01-01 NOTE — Patient Instructions (Signed)
Must quit alcohol within 2 weeks and attend AA

## 2013-01-02 LAB — HEPATIC FUNCTION PANEL
ALT: 30 U/L (ref 0–35)
Albumin: 4.1 g/dL (ref 3.5–5.2)
Alkaline Phosphatase: 98 U/L (ref 39–117)
Total Protein: 7.3 g/dL (ref 6.0–8.3)

## 2013-01-02 LAB — ETHANOL: Alcohol, Ethyl (B): <10 mg/dL (ref 0–10)

## 2013-01-05 ENCOUNTER — Ambulatory Visit (INDEPENDENT_AMBULATORY_CARE_PROVIDER_SITE_OTHER): Payer: Medicare Other | Admitting: Psychiatry

## 2013-01-05 DIAGNOSIS — F319 Bipolar disorder, unspecified: Secondary | ICD-10-CM

## 2013-01-05 NOTE — Progress Notes (Signed)
Patient:  [name]   DOB: June 27, 1960  MR Number: [id number]  Location: [facility]:  [address] [place],  [place], [address]  Start: Friday 01/05/2013 1:00 PM End: Friday 01/05/2013 1:50 PM  Provider/Observer:     Florencia Reasons, MSW, LCSW   Chief Complaint:      Chief Complaint  Patient presents with  . Anxiety  . Stress    Reason For Service:     Patient has a long-standing history of bipolar disorder and has been seen in this practice for several years. She continues to experience periods of high stress, mood swings, and explosive outbursts at times. Patient is seen for a follow-up appointment.  Interventions Strategy:  Supportive therapy, cognitive behavioral therapy  Participation Level:   Active  Participation Quality:  Appropriate      Behavioral Observation:  Casual, alert,   Current Psychosocial Factors:   Content of Session:   Reviewing symptoms, processing feelings, identifying ways to increase structure and involvement in activities, reviewing relaxation techniques, discussing community resources  Current Status:   Patient reports absence of anger outbursts and decreased  irritability but continued memory difficulty. She denies suicidal and homicidal ideations. She reports discontinuing alcohol use on 01/01/2013.  Patient Progress:   The patient reports feeling call more since taking Lexapro as prescribed by psychiatrist Dr. Tenny Craw. Patient has had no anger outburst since last session. She reports becoming angry with husband but being able to disengage and walk away. She also reports being assertive with husband rather than aggressive. She has discontinued alcohol use and reports throwing away all of her  liquor on 01/01/2013. Therapist and patient discuss community resources to assist patient  maintain abstinence from alcohol. Patient plans to contact AA. Patient also plans to attend an Al-Anon group to assist her in coping with husband's alcohol abuse.  Patient has tried to increase involvement in activity [facility] and recently having lunch with her son. She also is planning time for self. Therapist works with patient to identify ways to increase structure in involvement in activities. Patient agrees to complete plan your day handout and bring to next session.   Target Goals:   1. Increase and maintain consistent involvement in activity developing structure and routine. 1:1 psychotherapy one time every 1-4 weeks (supportive, cognitive behavioral therapy)    2. Improve ability to manage stress without becoming overwhelmed or having anger outbursts. 1:1 psychotherapy one time every 1-4 weeks (supportive, cognitive behavioral therapy)    3. Improve assertiveness skills and ability to set and maintain boundaries. 1:1 psychotherapy one time every 1-4 weeks (supportive, cognitive behavioral therapy)  Last Reviewed:   10/31/2012  Goals Addressed Today:    Goals 1, 2, and 3  Impression/Diagnosis:   The patient presents with a long-standing history of bipolar disorder and currently is experiencing increased anxiety, worry, mood swings, anger outbursts, and irritability. Diagnosis: bipolar disorder  Diagnosis:  Axis I:  Bipolar disorder          Axis II: Deferred

## 2013-01-05 NOTE — Patient Instructions (Signed)
Discussed orally 

## 2013-01-11 ENCOUNTER — Ambulatory Visit (INDEPENDENT_AMBULATORY_CARE_PROVIDER_SITE_OTHER): Payer: Medicare Other | Admitting: Physician Assistant

## 2013-01-11 ENCOUNTER — Encounter (INDEPENDENT_AMBULATORY_CARE_PROVIDER_SITE_OTHER): Payer: Self-pay

## 2013-01-11 VITALS — BP 138/90 | HR 82 | Resp 16 | Ht 67.0 in | Wt 285.2 lb

## 2013-01-11 DIAGNOSIS — Z4651 Encounter for fitting and adjustment of gastric lap band: Secondary | ICD-10-CM

## 2013-01-11 NOTE — Patient Instructions (Signed)
Return in three weeks. Focus on good food choices as well as physical activity. Return sooner if you have an increase in hunger, portion sizes or weight. Return also for difficulty swallowing, night cough, reflux.   

## 2013-01-11 NOTE — Progress Notes (Signed)
  HISTORY: [name] is a 52 y.o.female who received an AP-Large lap-band in November 2009 by Dr. Ezzard Standing. She comes in with 8 lbs weight gain since her last visit. She describes having a stomach virus two weeks ago and since then has had difficulty swallowing solid foods other than high-carb slider foods and sweets. She has no further nausea but she does describe obstructive symptoms. She would like some fluid removed today.  VITAL SIGNS: Filed Vitals:   01/11/13 1121  BP: 138/90  Pulse: 82  Resp: 16    PHYSICAL EXAM: Physical exam reveals a very well-appearing 52 y.o.female in no apparent distress Neurologic: Awake, alert, oriented Psych: Bright affect, conversant Respiratory: Breathing even and unlabored. No stridor or wheezing Abdomen: Soft, nontender, nondistended to palpation. Incisions well-healed. No incisional hernias. Port easily palpated. Extremities: Atraumatic, good range of motion.  ASSESMENT: 52 y.o.  female  s/p AP-Large lap-band.   PLAN: The patient's port was accessed with a 20G Huber needle without difficulty. Clear fluid was aspirated and 2.5 mL saline was removed from the port to give a total predicted volume of 4.5 mL. The patient was advised to concentrate on healthy food choices and to avoid slider foods high in fats and carbohydrates.

## 2013-01-12 ENCOUNTER — Ambulatory Visit (HOSPITAL_COMMUNITY): Payer: Self-pay | Admitting: Psychiatry

## 2013-01-15 ENCOUNTER — Ambulatory Visit (INDEPENDENT_AMBULATORY_CARE_PROVIDER_SITE_OTHER): Payer: Medicare Other | Admitting: Psychiatry

## 2013-01-15 ENCOUNTER — Encounter (HOSPITAL_COMMUNITY): Payer: Self-pay | Admitting: Psychiatry

## 2013-01-15 ENCOUNTER — Ambulatory Visit (HOSPITAL_COMMUNITY)
Admission: RE | Admit: 2013-01-15 | Discharge: 2013-01-15 | Disposition: A | Discharge status: Home / Self Care | Payer: Medicare Other | Source: Ambulatory Visit | Attending: Family Medicine | Admitting: Family Medicine

## 2013-01-15 VITALS — BP 140/90 | Ht 67.0 in | Wt 285.0 lb

## 2013-01-15 DIAGNOSIS — Z139 Encounter for screening, unspecified: Secondary | ICD-10-CM

## 2013-01-15 DIAGNOSIS — Z1231 Encounter for screening mammogram for malignant neoplasm of breast: Secondary | ICD-10-CM | POA: Insufficient documentation

## 2013-01-15 DIAGNOSIS — F319 Bipolar disorder, unspecified: Secondary | ICD-10-CM

## 2013-01-15 DIAGNOSIS — F1011 Alcohol abuse, in remission: Secondary | ICD-10-CM
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
## 2013-01-18 ENCOUNTER — Ambulatory Visit (INDEPENDENT_AMBULATORY_CARE_PROVIDER_SITE_OTHER): Payer: Medicare Other | Admitting: Psychiatry

## 2013-01-18 DIAGNOSIS — F319 Bipolar disorder, unspecified: Secondary | ICD-10-CM

## 2013-01-18 NOTE — Patient Instructions (Signed)
Discussed orally 
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
## 2013-01-19 ENCOUNTER — Ambulatory Visit (INDEPENDENT_AMBULATORY_CARE_PROVIDER_SITE_OTHER): Payer: Medicare Other | Admitting: Psychology

## 2013-01-19 DIAGNOSIS — F319 Bipolar disorder, unspecified: Secondary | ICD-10-CM

## 2013-01-19 DIAGNOSIS — R413 Other amnesia: Secondary | ICD-10-CM

## 2013-01-21 ENCOUNTER — Encounter (HOSPITAL_COMMUNITY): Payer: Self-pay | Admitting: *Deleted

## 2013-01-21 ENCOUNTER — Emergency Department (HOSPITAL_COMMUNITY)
Admission: EM | Admit: 2013-01-21 | Discharge: 2013-01-21 | Disposition: A | Discharge status: Home / Self Care | Payer: Medicare Other | Attending: Emergency Medicine | Admitting: Emergency Medicine

## 2013-01-21 DIAGNOSIS — M79604 Pain in right leg: Secondary | ICD-10-CM

## 2013-01-21 DIAGNOSIS — Z8719 Personal history of other diseases of the digestive system: Secondary | ICD-10-CM | POA: Insufficient documentation

## 2013-01-21 DIAGNOSIS — Z79899 Other long term (current) drug therapy: Secondary | ICD-10-CM | POA: Insufficient documentation

## 2013-01-21 DIAGNOSIS — R112 Nausea with vomiting, unspecified: Secondary | ICD-10-CM | POA: Insufficient documentation

## 2013-01-21 DIAGNOSIS — F319 Bipolar disorder, unspecified: Secondary | ICD-10-CM | POA: Insufficient documentation

## 2013-01-21 DIAGNOSIS — IMO0002 Reserved for concepts with insufficient information to code with codable children: Secondary | ICD-10-CM | POA: Insufficient documentation

## 2013-01-21 DIAGNOSIS — E119 Type 2 diabetes mellitus without complications: Secondary | ICD-10-CM | POA: Insufficient documentation

## 2013-01-21 DIAGNOSIS — G43909 Migraine, unspecified, not intractable, without status migrainosus: Secondary | ICD-10-CM | POA: Insufficient documentation

## 2013-01-21 DIAGNOSIS — M79609 Pain in unspecified limb: Secondary | ICD-10-CM | POA: Insufficient documentation

## 2013-01-21 DIAGNOSIS — R519 Headache, unspecified: Secondary | ICD-10-CM

## 2013-01-21 DIAGNOSIS — Z8739 Personal history of other diseases of the musculoskeletal system and connective tissue: Secondary | ICD-10-CM | POA: Insufficient documentation

## 2013-01-21 DIAGNOSIS — F411 Generalized anxiety disorder: Secondary | ICD-10-CM | POA: Insufficient documentation

## 2013-01-21 DIAGNOSIS — Z872 Personal history of diseases of the skin and subcutaneous tissue: Secondary | ICD-10-CM | POA: Insufficient documentation

## 2013-01-21 DIAGNOSIS — R42 Dizziness and giddiness: Secondary | ICD-10-CM | POA: Insufficient documentation

## 2013-01-21 LAB — BASIC METABOLIC PANEL
BUN: 27 mg/dL — ABNORMAL HIGH (ref 6–23)
CO2: 29 mEq/L (ref 19–32)
Calcium: 9.7 mg/dL (ref 8.4–10.5)
Chloride: 102 mEq/L (ref 96–112)
Creatinine, Ser: 0.61 mg/dL (ref 0.50–1.10)
GFR calc Af Amer: >90 mL/min (ref >90)
GFR calc non Af Amer: >90 mL/min (ref >90)
Glucose, Bld: 94 mg/dL (ref 70–99)
Potassium: 4 mEq/L (ref 3.5–5.1)
Sodium: 138 mEq/L (ref 135–145)

## 2013-01-21 LAB — CBC WITH DIFFERENTIAL/PLATELET
Basophils Absolute: 0.1 10*3/uL (ref 0.0–0.1)
Basophils Relative: 1 % (ref 0–1)
Eosinophils Absolute: 0.4 10*3/uL (ref 0.0–0.7)
Eosinophils Relative: 3 % (ref 0–5)
HCT: 44.3 % (ref 36.0–46.0)
Hemoglobin: 15 g/dL (ref 12.0–15.0)
Lymphocytes Relative: 26 % (ref 12–46)
Lymphs Abs: 2.8 10*3/uL (ref 0.7–4.0)
MCH: 30.5 pg (ref 26.0–34.0)
MCHC: 33.9 g/dL (ref 30.0–36.0)
MCV: 90.2 fL (ref 78.0–100.0)
Monocytes Absolute: 0.9 10*3/uL (ref 0.1–1.0)
Monocytes Relative: 9 % (ref 3–12)
Neutro Abs: 6.6 10*3/uL (ref 1.7–7.7)
Neutrophils Relative %: 61 % (ref 43–77)
Platelets: 193 10*3/uL (ref 150–400)
RBC: 4.91 MIL/uL (ref 3.87–5.11)
RDW: 13.5 % (ref 11.5–15.5)
WBC: 10.7 10*3/uL — ABNORMAL HIGH (ref 4.0–10.5)

## 2013-01-21 LAB — MAGNESIUM: Magnesium: 2.2 mg/dL (ref 1.5–2.5)

## 2013-01-21 LAB — GLUCOSE, CAPILLARY: Glucose-Capillary: 89 mg/dL (ref 70–99)

## 2013-01-21 MED ORDER — DIPHENHYDRAMINE HCL 50 MG/ML IJ SOLN
25.0000 mg | Freq: Once | INTRAMUSCULAR | Status: AC
  Administered 2013-01-21: 25 mg via INTRAVENOUS
  Filled 2013-01-21: qty 1

## 2013-01-21 MED ORDER — SODIUM CHLORIDE 0.9 % IV BOLUS (SEPSIS)
1000.0000 mL | Freq: Once | INTRAVENOUS | Status: AC
  Administered 2013-01-21: 1000 mL via INTRAVENOUS

## 2013-01-21 MED ORDER — KETOROLAC TROMETHAMINE 30 MG/ML IJ SOLN
15.0000 mg | Freq: Once | INTRAMUSCULAR | Status: AC
  Administered 2013-01-21: 15 mg via INTRAVENOUS
  Filled 2013-01-21: qty 1

## 2013-01-21 MED ORDER — METOCLOPRAMIDE HCL 5 MG/ML IJ SOLN
10.0000 mg | Freq: Once | INTRAMUSCULAR | Status: AC
  Administered 2013-01-21: 10 mg via INTRAVENOUS
  Filled 2013-01-21: qty 2

## 2013-01-21 NOTE — ED Provider Notes (Signed)
CSN: [id number]     Arrival date & time 01/21/13  1138 History  This chart was scribed for Raeford Razor, MD by Blanchard Kelch, ED Scribe. The patient was seen in room APA04/APA04. Patient's care was started at 12:39 PM.    Chief Complaint  Patient presents with  . Migraine  . Leg Pain   Patient is a 52 y.o. female presenting with migraines and leg pain. The history is provided by the patient. No language interpreter was used.  Migraine This is a chronic problem. The current episode started more than 2 days ago. The problem occurs constantly. The problem has not changed since onset.Associated symptoms include headaches. Treatments tried: migraine medication. The treatment provided no relief.  Leg Pain Associated symptoms: no back pain, no fever and no neck pain     HPI Comments: [name] is a 52 y.o. female with a history of migraines who presents to the Emergency Department complaining of constant, sudden onset migraines that started three days ago. The migraine is located bilaterally near her eyes, top of head and temples. She has associated dizziness, nausea, one episode of emesis. She reports they are more severe than her usual migraines. She has been taking her migraine medication without relief. She denies any recent injury to the area or falls. She also complains of pain on the back of her knee that first occurred two weeks ago. She got a cortisone shot on 9/11 but the pain has returned. She states she can't bend the leg all the way and walks with a cane. She has anxiety due to her issues with her husband who according to her has PTSD. She denies fever, chills, numbness, tingling, changes in strength, back pain or neck pain. She is not currently on anticoagulants.   Past Medical History  Diagnosis Date  . Diabetes mellitus   . Migraine   . Arthritis   . Constipation   . Generalized headaches   . Diverticula of colon 2009  . Adenomatous polyp 2009  . Anxiety   . Depression    . Pelvic floor dysfunction     abnormal [facility]orectal [facility]ometry at [facility] in 2009  . Diabetes mellitus, type II   . Psoriasis   . Bipolar 1 disorder    Past Surgical History  Procedure Laterality Date  . Colon surgery      complicated diverticulitis requiring sigmoid resection with colostomy and subsequent takedown  . Laparoscopic gastric banding  2009  . Heel spur surgery    . Abdominal hysterectomy    . Knee surgery      3 arthroscopic   . Colonoscopy  12/11/2007    Dr. Jena Gauss- marginal prep, normal rectum pancolonic diverticula, adenomatous polyp  . Tubal ligation    . Colonoscopy  08/28/2003       Wide open colonic anastomosis/Scattered diverticula noted throughout colon/ Small external hemorrhoids  . Colon resection    03/30/2002    with end-colostomy and Hartmann's pouch  . Laparoscopic salpingoopherectomy  03/30/2002  . Colostomy closure  07/10/2002  . Knee arthroscopy  02/04/2004     left knee/partial medial meniscectomy.  . Flexible sigmoidoscopy  01/20/2012    Procedure: FLEXIBLE SIGMOIDOSCOPY;  Surgeon: Corbin Ade, MD;  Location: [facility];  Service: Endoscopy;  Laterality: N/A;   Family History  Problem Relation Age of Onset  . Ovarian cancer Mother   . Heart disease Mother   . Anxiety disorder Mother   . Cirrhosis Father  deceased age 76  . Alcohol abuse Father   . Colon cancer Other     aunt, deceased age 42  . Breast cancer Other     aunt, deceased age 26  . Liver cancer Cousin     age 89, deceased  . Drug abuse Cousin   . ADD / ADHD Son   . Anxiety disorder Sister   . Dementia Maternal Grandfather   . Bipolar disorder Neg Hx   . Depression Neg Hx   . OCD Neg Hx   . Paranoid behavior Neg Hx   . Schizophrenia Neg Hx   . Seizures Neg Hx   . Sexual abuse Neg Hx   . Physical abuse Neg Hx    History  Substance Use Topics  . Smoking status: Never Smoker   . Smokeless tobacco: Never Used  . Alcohol Use: No     Comment: occasionally   OB History    Grav Para Term Preterm Abortions TAB SAB Ect Mult Living   2 2        2      Review of Systems  Constitutional: Negative for fever and chills.  HENT: Negative for neck pain.   Gastrointestinal: Positive for nausea and vomiting.  Musculoskeletal: Positive for arthralgias. Negative for back pain.  Neurological: Positive for dizziness and headaches.  All other systems reviewed and are negative.    Allergies  Bactrim; Neurontin; and Codeine  Home Medications   Current Outpatient Rx  Name  Route  Sig  Dispense  Refill  . ARIPiprazole (ABILIFY) 10 MG tablet   Oral   Take 1 tablet (10 mg total) by mouth daily. CALL back with the dose that works the best for you.   45 tablet   2   . Calcium Carb-Cholecalciferol 500-600 MG-UNIT CHEW   Oral   Chew 1 tablet by mouth 3 (three) times daily.         Marland Kitchen conjugated estrogens (PREMARIN) vaginal cream   Vaginal   Place vaginally daily.   42.5 g   12   . cycloSPORINE (RESTASIS) 0.05 % ophthalmic emulsion   Both Eyes   Place 1 drop into both eyes 2 (two) times daily.         . cyproheptadine (PERIACTIN) 4 MG tablet   Oral   Take 1 tablet (4 mg total) by mouth as needed (for the spice of life).   30 tablet   2   . diclofenac (VOLTAREN) 75 MG EC tablet   Oral   Take 75 mg by mouth 2 (two) times daily.         Marland Kitchen escitalopram (LEXAPRO) 20 MG tablet   Oral   Take 1 tablet (20 mg total) by mouth daily.   30 tablet   2   . exenatide (BYETTA) 10 MCG/0.04ML SOLN   Subcutaneous   Inject 10 mcg into the skin 2 (two) times daily with a meal.         . fluticasone (FLONASE) 50 MCG/ACT nasal spray   Nasal   Place 2 sprays into the nose daily as needed. Allergies         . loratadine (CLARITIN) 10 MG tablet   Oral   Take 10 mg by mouth daily.         Marland Kitchen lubiprostone (AMITIZA) 24 MCG capsule   Oral   Take 24 mcg by mouth 2 (two) times daily with a meal.         . metFORMIN (GLUCOPHAGE)  500 MG tablet   Oral   Take  500 mg by mouth 2 (two) times daily.         . Multiple Vitamin (MULTIVITAMIN PO)   Oral   Take 1 tablet by mouth daily.          Marland Kitchen omeprazole (PRILOSEC) 20 MG capsule   Oral   Take 20 mg by mouth daily as needed (acid reflux).          . pantoprazole (PROTONIX) 40 MG tablet   Oral   Take 40 mg by mouth 2 (two) times daily.         . polyethylene glycol (MIRALAX / GLYCOLAX) packet   Oral   Take 17 g by mouth daily.         Marland Kitchen Propylene Glycol-Glycerin (SOOTHE) 0.6-0.6 % SOLN   Both Eyes   Place 1 drop into both eyes 2 (two) times daily.         . rizatriptan (MAXALT-MLT) 10 MG disintegrating tablet   Oral   Take 1 tablet (10 mg total) by mouth as needed for migraine. May repeat in 2 hours if needed   10 tablet   4   . topiramate (TOPAMAX) 50 MG tablet   Oral   Take 1 tablet (50 mg total) by mouth 2 (two) times daily.   60 tablet   0   . traMADol (ULTRAM) 50 MG tablet   Oral   Take 50 mg by mouth 2 (two) times daily.           Triage Vitals: BP 149/88  Pulse 76  Temp(Src) 98.6 F (37 C) (Oral)  Resp 18  Ht 5\' 6"  (1.676 m)  Wt 284 lb (128.822 kg)  BMI 45.86 kg/m2  SpO2 98%  Physical Exam  Nursing note and vitals reviewed. Constitutional: She is oriented to person, place, and time. She appears well-developed and well-nourished.  HENT:  Head: Normocephalic and atraumatic.  Eyes: Conjunctivae and EOM are normal.  Neck: Neck supple.  NO nuchal rigidity/  Cardiovascular: Normal rate and regular rhythm.    Good DP pulses bilaterally.  Pulmonary/Chest: Effort normal and breath sounds normal.  Abdominal: Soft. She exhibits no distension. There is no tenderness.  Musculoskeletal: Normal range of motion.  No calf tenderness. Legs appear symmetric.  Neurological: She is alert and oriented to person, place, and time. No cranial nerve deficit.  Good finger to nose bilaterally.  Skin: Skin is warm and dry.   No concerning skin changes.  Psychiatric: She  has a normal mood and affect. Her behavior is normal.    ED Course  Procedures (including critical care time)  DIAGNOSTIC STUDIES: Oxygen Saturation is 98% on room air, normal by my interpretation.    COORDINATION OF CARE:  12:46 PM - Patient verbalizes understanding and agrees with treatment plan.    Labs Review Labs Reviewed  CBC WITH DIFFERENTIAL - Abnormal; Notable for the following:    WBC 10.7 (*)    All other components within normal limits  BASIC METABOLIC PANEL - Abnormal; Notable for the following:    BUN 27 (*)    All other components within normal limits  MAGNESIUM  GLUCOSE, CAPILLARY   Imaging Review No results found.  MDM   1. Headache   2. Right leg pain    52yF with HA. Suspect primary HA. Consider emergent secondary causes such as bleed, infectious or mass but doubt. There is no history of trauma. Pt has a nonfocal neurological  exam. Afebrile and neck supple. No use of blood thinning medication. Consider ocular etiology such as acute angle closure glaucoma but doubt. Pt denies acute change in visual acuity and eye exam unremarkable. Doubt temporal arteritis. Barely over 50. No temporal tenderness and temporal artery pulsations palpable. Doubt CO poisoning. No contacts with similar symptoms. Doubt venous thrombosis. Doubt carotid or vertebral arteries dissection. Symptoms improved with meds. Patient also with atraumatic right lower extremity pain. Some concern for possible DVT. Recommended empiric treatment with a dose of Lovenox and return tomorrow for an ultrasound. Patient is declining this. She has medical decision-making capability. She understands that this can be arranged for him in the morning. She states she would rather followup with her PCP regarding this. Return precautions were discussed.   I personally preformed the services scribed in my presence. The recorded information has been reviewed is accurate. Raeford Razor, MD.    Raeford Razor,  MD 01/21/13 [phone]

## 2013-01-21 NOTE — ED Notes (Addendum)
Pt states leg cramps x 2 weeks. States pain to bend of knee of right leg. States she received a cortisone shot to the knee on Thursday. Migraine x 3 days no vomiting today, denies nausea today also. Pt state she is concerned that she may have a blood clot and would also like a potassium and magnesium checked.

## 2013-01-21 NOTE — ED Notes (Signed)
Pt sitting up on bed, texting on cell phone, pt requesting to have her blood sugar checked, something to eat and drink,  states that it feels like it is low,

## 2013-01-21 NOTE — ED Notes (Signed)
Dr Kohut at bedside,  

## 2013-02-01 ENCOUNTER — Ambulatory Visit (HOSPITAL_COMMUNITY): Payer: Self-pay | Admitting: Psychiatry

## 2013-02-01 ENCOUNTER — Ambulatory Visit (INDEPENDENT_AMBULATORY_CARE_PROVIDER_SITE_OTHER): Payer: Medicare Other | Admitting: Psychiatry

## 2013-02-01 ENCOUNTER — Encounter (INDEPENDENT_AMBULATORY_CARE_PROVIDER_SITE_OTHER): Payer: Medicare Other

## 2013-02-01 DIAGNOSIS — F319 Bipolar disorder, unspecified: Secondary | ICD-10-CM
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
## 2013-02-13 ENCOUNTER — Ambulatory Visit (INDEPENDENT_AMBULATORY_CARE_PROVIDER_SITE_OTHER): Payer: Medicare Other | Admitting: Psychology

## 2013-02-13 DIAGNOSIS — F319 Bipolar disorder, unspecified: Secondary | ICD-10-CM

## 2013-02-13 DIAGNOSIS — G43901 Migraine, unspecified, not intractable, with status migrainosus: Secondary | ICD-10-CM

## 2013-02-13 DIAGNOSIS — R413 Other amnesia: Secondary | ICD-10-CM

## 2013-02-14 ENCOUNTER — Encounter (HOSPITAL_COMMUNITY): Payer: Self-pay | Admitting: Psychiatry

## 2013-02-14 ENCOUNTER — Ambulatory Visit (INDEPENDENT_AMBULATORY_CARE_PROVIDER_SITE_OTHER): Payer: Medicare Other | Admitting: Psychiatry

## 2013-02-14 VITALS — BP 130/90 | Ht 66.0 in | Wt 287.0 lb

## 2013-02-14 DIAGNOSIS — F39 Unspecified mood [affective] disorder: Secondary | ICD-10-CM

## 2013-02-14 DIAGNOSIS — F319 Bipolar disorder, unspecified: Secondary | ICD-10-CM

## 2013-02-14 DIAGNOSIS — F1011 Alcohol abuse, in remission: Secondary | ICD-10-CM

## 2013-02-14 DIAGNOSIS — F3189 Other bipolar disorder: Secondary | ICD-10-CM

## 2013-02-14 MED ORDER — ESCITALOPRAM OXALATE 20 MG PO TABS: 20.0000 mg | ORAL_TABLET | Freq: Every day | ORAL | Status: DC

## 2013-02-14 MED ORDER — ARIPIPRAZOLE 10 MG PO TABS: 10.0000 mg | ORAL_TABLET | Freq: Every day | ORAL | Status: DC
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
## 2013-02-15 ENCOUNTER — Ambulatory Visit (INDEPENDENT_AMBULATORY_CARE_PROVIDER_SITE_OTHER): Payer: Medicare Other | Admitting: Psychiatry

## 2013-02-15 DIAGNOSIS — F319 Bipolar disorder, unspecified: Secondary | ICD-10-CM
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
## 2013-02-15 NOTE — Patient Instructions (Signed)
Discussed orally 

## 2013-02-20 ENCOUNTER — Ambulatory Visit (INDEPENDENT_AMBULATORY_CARE_PROVIDER_SITE_OTHER): Payer: Medicare Other | Admitting: Psychology

## 2013-02-20 DIAGNOSIS — R413 Other amnesia: Secondary | ICD-10-CM

## 2013-02-20 DIAGNOSIS — G43901 Migraine, unspecified, not intractable, with status migrainosus: Secondary | ICD-10-CM

## 2013-02-21 ENCOUNTER — Telehealth: Payer: Self-pay | Admitting: Neurology

## 2013-02-21 NOTE — Telephone Encounter (Signed)
I called patient. The patient indicates that over the last year, she has had some memory and cognitive issues. The patient was reduced on her Topamax from 150 mg daily to 100 mg daily when seen in May. The patient also indicates that she has a history of sleep apnea, and was told that she needed CPAP, but she could not tolerate the machine. The patient has excessive daytime drowsiness. The patient recently had formal neuropsychological testing. The patient last had MRI evaluation the brain in 2012, and this was normal. The patient will be seen within the next several weeks for an evaluation.

## 2013-02-22 ENCOUNTER — Other Ambulatory Visit (HOSPITAL_COMMUNITY): Payer: Self-pay | Admitting: Physician Assistant

## 2013-02-22 DIAGNOSIS — M79609 Pain in unspecified limb: Secondary | ICD-10-CM

## 2013-02-23 ENCOUNTER — Encounter: Payer: Self-pay | Admitting: Neurology

## 2013-02-23 ENCOUNTER — Ambulatory Visit (HOSPITAL_COMMUNITY)
Admission: RE | Admit: 2013-02-23 | Discharge: 2013-02-23 | Disposition: A | Discharge status: Home / Self Care | Payer: Medicare Other | Source: Ambulatory Visit | Attending: Physician Assistant | Admitting: Physician Assistant

## 2013-02-23 DIAGNOSIS — M79609 Pain in unspecified limb: Secondary | ICD-10-CM | POA: Insufficient documentation

## 2013-03-01 ENCOUNTER — Telehealth (HOSPITAL_COMMUNITY): Payer: Self-pay | Admitting: *Deleted

## 2013-03-01 ENCOUNTER — Encounter (HOSPITAL_COMMUNITY): Payer: Self-pay | Admitting: Psychology

## 2013-03-01 ENCOUNTER — Ambulatory Visit (INDEPENDENT_AMBULATORY_CARE_PROVIDER_SITE_OTHER): Payer: Medicare Other | Admitting: Psychiatry

## 2013-03-01 DIAGNOSIS — F319 Bipolar disorder, unspecified: Secondary | ICD-10-CM
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
## 2013-03-01 NOTE — Progress Notes (Addendum)
Patient:  [name]   DOB: Apr 21, 1961  MR Number: [id number]  Location: [facility] Center:  [address] [place],  [place], [address]  Start: Thursday 03/01/2013 2:00 PM End: Thursday 03/01/2013 2:45 PM  Provider/Observer:     Florencia Reasons, MSW, LCSW   Chief Complaint:      Chief Complaint  Patient presents with  . Anxiety    Reason For Service:     Patient has a long-standing history of bipolar disorder and has been seen in this practice for several years. She continues to experience periods of high stress, mood swings, and explosive outbursts at times. Patient is seen for a follow-up appointment.  Interventions Strategy:  Supportive therapy, cognitive behavioral therapy  Participation Level:   Active  Participation Quality:  Appropriate      Behavioral Observation:  Casual, alert,   Current Psychosocial Factors: Patient continues to experience ongoing marital stress and chronic pain  Content of Session:   Reviewing symptoms, processing feelings, reinforcing patient's efforts to improve self-care, identifying ways to maintain consistency regarding self-care efforts,identifying coping statements  Current Status:   Patient reports improved mood, decreased worry, and increased motivation.  Patient Progress:   The patient reports feeling better since last session and states lexapro has helped. She has resumed using plan your day handouts and has increased involvement in activity. [facility] And walking in the pool which has helped her manage chronic knee pain. Patient also recently went on a shopping trip with her sister. She has resumed reading her Bible regularly, listening to music, and attending the [employer] group. She reports continued marital stress but being able to manage this better by using coping statements. She reports experiencing one incident when she became angry with husband since last session but being able to manage anger in a  healthy way by using relaxation breathing and counting. She is pleased that recent testing indicates she does not have Alzheimer's. She has been referred to neurologist and recommended to have a MRI. She plans to see a neurologist next Monday.  Target Goals:   1. Increase and maintain consistent involvement in activity developing structure and routine. 1:1 psychotherapy one time every 1-4 weeks (supportive, cognitive behavioral therapy)    2. Improve ability to manage stress without becoming overwhelmed or having anger outbursts. 1:1 psychotherapy one time every 1-4 weeks (supportive, cognitive behavioral therapy)    3. Improve assertiveness skills and ability to set and maintain boundaries. 1:1 psychotherapy one time every 1-4 weeks (supportive, cognitive behavioral therapy)  Last Reviewed:   10/31/2012  Goals Addressed Today:    Goals 1, 2, and 3  Impression/Diagnosis:   The patient presents with a long-standing history of bipolar disorder and currently is experiencing increased anxiety, worry, mood swings, anger outbursts, and irritability. Diagnosis: bipolar disorder  Diagnosis:  Axis I:  Bipolar disorder          Axis II: Deferred

## 2013-03-01 NOTE — Progress Notes (Signed)
The patient was assessed on 02/13/2013.  The patient was administered a neuropsychological test battery consisting of the Wechsler Adult Intelligence Scale-III and the Wechsler Memory Scale-III.  Behavioral observations suggest that this does appear to be a valid assessment and the patient did appear to try her hardest.  While the patient was very anxious about doing as well as she  could she adapted to the testing situation fairly well and this does appear to be a fair and valid sample of her current functioning.  GENERAL INTELLECTUAL FUNCTIONING:  The patient's performance on the Weschler Adult Intellectual Scale-III, classifies her current intellectual abilities to be in the borderline Range, with a Full Scale IQ score of 74, a Verbal IQ score of 76, and a Performance IQ score of 76.  Overall, her performance places her at the fourth percentile with regard to her age-related peer group.  Analysis on Index Scores produced a Verbal Comprehension Index Score of 86, a Perceptual Organization Index Score of 74, a Working Memory Index Score of 65, and a Processing Speed Index Score of 81.   Overall, WAIS-III performance suggest that the patient's VIQ vs. PIQ were both significantly impaired with no significant difference between verbal and performance measures. As far as index scores the patient should the most impairment with regard to both auditory and visual encoding abilities as well as visual-spatial abilities with better abilities with regard to verbal comprehension and verbal abilities and information processing speed. However, all of the measures were significantly below predicted levels.    Below are the Age-Adjusted scores for each of the individual WAIS-III subtests.  Vocabulary:    6 Similarities:    8 Arithmetic:    5 Digit Span:    4 Information:    8 Comprehension:   5 Letter Number Sequencing:  4  Picture Completion:   6 Digit Symbol:    7 Block Design:    5 Matrix  Reasoning:   6 Picture Arrangement:   7 Symbol Search:   6  WAIS-III analysis reveals a VIQ score of 76, which places her at the fifth percentile.  Significant scatter was noted in subtest performance.  The patient displayed low average performance with regard to her. Verbal reasoning abilities and her general fund of information. She also had low ever to abilities with regard to her vocabulary knowledge. The patient displayed significant deficits with regard to measures that are sensitive to attention and concentration as well as social judgment and comprehension. The patient also showed severe impairments with regard to auditory encoding and multiprocessing abilities.  WAIS-III analysis reveals a PIQ score of 76, which places her at the fifth percentile. While significant scatter was not noted in subtest performance she did produce a below average range with regard to information processing speed, visual scanning, and visual searching abilities.   significant impairments relative to predicted levels were found for her visual spatial abilities including visual analysis and organization, visual scanning and searching abilities, visual reasoning abilities, and visual Gestalt and attention.  Overall, WAIS-III performance was in the  borderline range and significantly below predicted levels based on her education and-like experiences. The patient showed severe impairments with regard to working memory including auditory encoding abilities, attention and concentration, as well as visual-spatial abilities and social reasoning and comprehension. Her best scores were all measures that tend to be more resistant to neuropsychological insult such as verbal reasoning and her general fund of information.  ATTENTION/CONCENTRATION AND MEMORY:  On the WMS-III, the patient obtained  a Working [employer] fast streamer (Attention and Encoding)  Score of  66, which is  significantly below predicted levels based on her full-scale IQ score  even though her full IQ score are already predicting significant deficits relative to prediction's of premorbid cognitive functioning.  Items making up this Index suggest that auditory encoding, visual encoding and other measures of attention and concentration were all significantly impaired.       Predicted based on FSIQ:  Actual Score  Working Memory:    82    66 Immediate Memory:    85    67 Auditory Immediate:    85    80 Visual Immediate:    91    65 General Memory:    84    77 Auditory Delayed:    87    85 Visual Delayed:    89    68 Auditory Recognition Delayed:   87    85  Anterograde memory functions, as analyzed by the WMS-III, produced an Immediate Memory Index Score of  67, which is  significantly what would be predicted by the FSIQ of  85. There was a significant difference between the patient's performance on immediate auditory memory versus immediate visual memory abilities as she produced an Verbal Immediate Memory Index Score of  80 and a Visual Immediate Memory Index Score of 65. Analyses of Delayed Recall of previously presented information produce a General Memory Index Score of  77.  This performance is  significantly with predicted levels.  There was  also a significant difference between delayed auditory memory versus delayed visual memory, as the patient produced an Auditory Delayed Index Score of  89 and a Visual Delayed Index Score of  65.    she did show a  adequate initial learning of auditory information and  adequate retention of information.  This suggests  the patient did benefiting significantly from repeated exposures to auditory information. When this recall of auditory material after a delay is changed from a free recall format to a recognition format, the patient's performance showed  some improvement and suggested that some of the reported memory difficulties have to do with  retrieval issues   The most significant overall finding of this performance is  significant  deficits in visual learning and memory. The patient showed severe deficits with regard to immediate learning of visual information and significant deficits with regard to ability to retain that visual information over a period of delay. There was nearly a 20 point difference between visual learning and abilities (low end of the average range) and severe deficits with regard to visual learning and memory. There also severe deficits with regard to encoding abilities both auditory and visually.  CONCLUSIONS:    The overall results of these formal and objective measures of cognitive performance suggest  there are clearly some significant neuropsychological deficits noted. With regard to general cognitive and neuropsychological functioning, the patient displayed significant global performance below predicted levels based on her education and life history. She is essentially functioning in the borderline range of cognitive functioning globally. The patient's most severe deficits in general cognitive functioning had to do with her working memory and in particular  encoding for auditory and visual information and multiprocessing abilities. The patient's best performance had to do with cognitive abilities typically seen as "Hold Tests" such as her vocabulary and her general fund of information. Visual spatial deficits along with both visual and verbal reasoning abilities were also found to be impaired. Her general information  processing speed was not found to be as impaired but was mildly down from predicted levels.  The patient's memory was tested on both auditory and visual sensory modalities were also conducted. There was a marked and significant difference between the patient's verbal memory versus visual memory. While her verbal/auditory memory was below predicted levels it was consistent with Global cognitive functioning. However, her visual memory was significantly impaired and was nearly 20 standard points  below auditory learning and memory. This clearly suggest some differences between right and left hemispheric functioning (with right hemisphere functioning seen to be more impaired ) as well as some possible subcortical issues. Overall, there are clear neuropsychological deficits that go well beyond the issues such as depression and psychiatric issues. In fact,  Depression tends to have the most military its effect upon auditory learning and memory versus visual learning and memory. This is not the case  with this individual.  As far as diagnostic considerations, patient's performance on this broad range of neuropsychological measures including cognitive and memory functioning are not consistent with those typically found with early-onset dementia. While the patient describes geographic disorientation and  some language changes, she also describes a stepwise changed to her functioning. The patient has a history of severe and significant migraine events as well as type 2 diabetes both of which can be  producing vascular changes. She experiences significant visual disturbance as part of her aura phase of her migraines and her most severe deficits have to do with visual learning and memory. These visual learning and memory as well as visual processing and reasoning abilities are probably explaining the geographic disorientation issues she is having. The patient's auditory learning, while below predicted levels, is still within normal limits. Therefore, I do not think these neuropsychological deficits are related to progressive cortical dementia such as Alzheimer's. I do think that they are more likely to be related to issues such as her migraines and/or small vessel disease from her type 2 diabetes. In any event, the patient is clearly having some significant neuropsychological deficits that are progressive in nature. The patient has not had an MRI in some time and I think it would be prudent for her to return to her  neurologist with this information and for him to consider a repeat MRI to assess for any significant changes from her previous MRI.

## 2013-03-01 NOTE — Patient Instructions (Signed)
Discussed orally 

## 2013-03-05 ENCOUNTER — Encounter: Payer: Self-pay | Admitting: Neurology

## 2013-03-05 ENCOUNTER — Ambulatory Visit (INDEPENDENT_AMBULATORY_CARE_PROVIDER_SITE_OTHER): Payer: Medicare Other | Admitting: Neurology

## 2013-03-05 VITALS — BP 152/100 | HR 91 | Wt 290.0 lb

## 2013-03-05 DIAGNOSIS — G43019 Migraine without aura, intractable, without status migrainosus: Secondary | ICD-10-CM

## 2013-03-05 DIAGNOSIS — R413 Other amnesia: Secondary | ICD-10-CM

## 2013-03-05 DIAGNOSIS — G4733 Obstructive sleep apnea (adult) (pediatric): Secondary | ICD-10-CM
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
## 2013-03-06 LAB — TSH: TSH: 2.02 u[IU]/mL (ref 0.450–4.500)

## 2013-03-08 NOTE — Progress Notes (Signed)
Quick Note:  Shared unremarkable blood work with patient,she understood ______

## 2013-03-09 ENCOUNTER — Telehealth: Payer: Self-pay | Admitting: Neurology

## 2013-03-09 DIAGNOSIS — E669 Obesity, unspecified: Secondary | ICD-10-CM

## 2013-03-09 DIAGNOSIS — G4733 Obstructive sleep apnea (adult) (pediatric): Secondary | ICD-10-CM

## 2013-03-09 NOTE — Telephone Encounter (Signed)
refers patient for attended sleep study: Dr. Novella Olive. Willis  Height: 5'6  Weight: 290 lbs.  BMI:  46.83  Past Medical History:Diabetes mellitus Migraine Arthritis Constipation Generalized headaches Diverticula of colon 2009 Adenomatous polyp 2009 Anxiety Depression Pelvic floor dysfunction abnormal anorectal manometry at [facility] in 2009 Diabetes mellitus, type II Psoriasis Bipolar 1 disorder Obesity    Sleep Symptoms: The patient has a history of sleep apnea, but she has not been on her CPAP for several years secondary to tolerance of the mask.   Epworth Score: hx. Of sleep apnea was on CPAP  Medications:  ARIPiprazole (Tab) ABILIFY 10 MG Take 1 tablet (10 mg total) by mouth at bedtime. CALL back with the dose that works the best for you. Calcium Carb-Cholecalciferol (Chew Tab) Calcium Carb-Cholecalciferol 500-600 MG-UNIT Chew 1 tablet by mouth 3 (three) times daily. CycloSPORINE (Emulsion) RESTASIS 0.05 % Place 1 drop into both eyes 2 (two) times daily. Cyproheptadine HCl (Tab) PERIACTIN 4 MG Take 1 tablet (4 mg total) by mouth as needed (for the spice of life). Diclofenac Sodium (Tablet Delayed Response) VOLTAREN 75 MG Take 75 mg by mouth 2 (two) times daily. Escitalopram Oxalate (Tab) LEXAPRO 20 MG Take 1 tablet (20 mg total) by mouth daily. Estrogens, Conjugated (Cream) PREMARIN 0.625 MG/GM Place vaginally daily. Exenatide (Solution Pen-injector) BYETTA 10 MCG/0.04ML Inject 10 mcg into the skin 2 (two) times daily with a meal. Fluticasone Propionate (Suspension) FLONASE 50 MCG/ACT Place 2 sprays into the nose daily as needed. Allergies Loratadine (Tab) CLARITIN 10 MG Take 10 mg by mouth daily. Lubiprostone (Cap) AMITIZA 24 MCG Take 24 mcg by mouth 2 (two) times daily with a meal. MetFORMIN HCl (Tab) GLUCOPHAGE 500 MG Take 500 mg by mouth 2 (two) times daily. Multiple Vitamins-Minerals MULTIVITAMIN PO Take 1 tablet by mouth daily. Omeprazole (Capsule Delayed Release) PRILOSEC 20 MG Take 20 mg by mouth  daily as needed (acid reflux). Pantoprazole Sodium (Tablet Delayed Response) PROTONIX 40 MG Take 40 mg by mouth 2 (two) times daily. Polyethylene Glycol 3350 (Pack) MIRALAX / GLYCOLAX Take 17 g by mouth daily. Propylene Glycol-Glycerin (Solution) Propylene Glycol-Glycerin 0.6-0.6 % Place 1 drop into both eyes 2 (two) times daily. Rizatriptan Benzoate (Tablet Dispersible) MAXALT-MLT 10 MG Take 1 tablet (10 mg total) by mouth as needed for migraine. May repeat in 2 hours if needed Topiramate (Tab) TOPAMAX 50 MG Take 1 tablet (50 mg total) by mouth 2 (two) times daily. TraMADol HCl (Tab) ULTRAM 50 MG Take 50 mg by mouth 2 (two) times daily   Insurance: Medicare/Tricare  Please review patient information and submit instructions for scheduling and orders for sleep technologist.  Thank you!

## 2013-03-09 NOTE — Telephone Encounter (Signed)
After reviewing the sleep study referral, I entered a split night sleep study request on this patient, thanks.  Edward Guthmiller, MD, PhD [facility] ([facility])    

## 2013-03-12 NOTE — Telephone Encounter (Signed)
Patient has been scheduled for 04-09-13

## 2013-03-15 ENCOUNTER — Other Ambulatory Visit: Payer: Self-pay

## 2013-03-15 ENCOUNTER — Ambulatory Visit
Admission: RE | Admit: 2013-03-15 | Discharge: 2013-03-15 | Disposition: A | Discharge status: Home / Self Care | Payer: Medicare Other | Source: Ambulatory Visit | Attending: Neurology | Admitting: Neurology

## 2013-03-15 DIAGNOSIS — G43019 Migraine without aura, intractable, without status migrainosus: Secondary | ICD-10-CM

## 2013-03-15 DIAGNOSIS — R413 Other amnesia: Secondary | ICD-10-CM

## 2013-03-15 DIAGNOSIS — G4733 Obstructive sleep apnea (adult) (pediatric): Secondary | ICD-10-CM

## 2013-03-18 ENCOUNTER — Encounter: Payer: Self-pay | Admitting: Neurology

## 2013-03-29 ENCOUNTER — Ambulatory Visit (HOSPITAL_COMMUNITY): Payer: Self-pay | Admitting: Psychiatry

## 2013-04-03 ENCOUNTER — Encounter (HOSPITAL_COMMUNITY): Payer: Self-pay | Admitting: Psychology

## 2013-04-03 NOTE — Progress Notes (Signed)
Provided feedback regarding the recent neuropsychological evaluation.  Conclusions are reprinted below.    CONCLUSIONS:  The overall results of these formal and objective measures of cognitive performance suggest there are clearly some significant neuropsychological deficits noted. With regard to general cognitive and neuropsychological functioning, the patient displayed significant global performance below predicted levels based on her education and life history. She is essentially functioning in the borderline range of cognitive functioning globally. The patient's most severe deficits in general cognitive functioning had to do with her working memory and in particular encoding for auditory and visual information and multiprocessing abilities. The patient's best performance had to do with cognitive abilities typically seen as "Hold Tests" such as her vocabulary and her general fund of information. Visual spatial deficits along with both visual and verbal reasoning abilities were also found to be impaired. Her general information processing speed was not found to be as impaired but was mildly down from predicted levels.  The patient's memory was tested on both auditory and visual sensory modalities were also conducted. There was a marked and significant difference between the patient's verbal memory versus visual memory. While her verbal/auditory memory was below predicted levels it was consistent with Global cognitive functioning. However, her visual memory was significantly impaired and was nearly 20 standard points below auditory learning and memory. This clearly suggest some differences between right and left hemispheric functioning (with right hemisphere functioning seen to be more impaired ) as well as some possible subcortical issues. Overall, there are clear neuropsychological deficits that go well beyond the issues such as depression and psychiatric issues. In fact, Depression tends to have the most  military its effect upon auditory learning and memory versus visual learning and memory. This is not the case with this individual.  As far as diagnostic considerations, patient's performance on this broad range of neuropsychological measures including cognitive and memory functioning are not consistent with those typically found with early-onset dementia. While the patient describes geographic disorientation and some language changes, she also describes a stepwise changed to her functioning. The patient has a history of severe and significant migraine events as well as type 2 diabetes both of which can be producing vascular changes. She experiences significant visual disturbance as part of her aura phase of her migraines and her most severe deficits have to do with visual learning and memory. These visual learning and memory as well as visual processing and reasoning abilities are probably explaining the geographic disorientation issues she is having. The patient's auditory learning, while below predicted levels, is still within normal limits. Therefore, I do not think these neuropsychological deficits are related to progressive cortical dementia such as Alzheimer's. I do think that they are more likely to be related to issues such as her migraines and/or small vessel disease from her type 2 diabetes. In any event, the patient is clearly having some significant neuropsychological deficits that are progressive in nature. The patient has not had an MRI in some time and I think it would be prudent for her to return to her neurologist with this information and for him to consider a repeat MRI to assess for any significant changes from her previous MRI.

## 2013-04-09 ENCOUNTER — Ambulatory Visit (INDEPENDENT_AMBULATORY_CARE_PROVIDER_SITE_OTHER): Payer: Medicare Other

## 2013-04-09 DIAGNOSIS — G4761 Periodic limb movement disorder: Secondary | ICD-10-CM

## 2013-04-09 DIAGNOSIS — G479 Sleep disorder, unspecified: Secondary | ICD-10-CM

## 2013-04-09 DIAGNOSIS — R519 Headache, unspecified: Secondary | ICD-10-CM

## 2013-04-09 DIAGNOSIS — E669 Obesity, unspecified: Secondary | ICD-10-CM

## 2013-04-09 DIAGNOSIS — G4710 Hypersomnia, unspecified: Secondary | ICD-10-CM

## 2013-04-09 DIAGNOSIS — G4733 Obstructive sleep apnea (adult) (pediatric): Secondary | ICD-10-CM

## 2013-04-09 NOTE — Telephone Encounter (Signed)
Patient is calling for results of MRI

## 2013-04-10 NOTE — Telephone Encounter (Signed)
I called pt to f/u on this message.   She has not looked at her MyChart messages.   I gave her the results of her MRI.  She verbalized understanding.

## 2013-04-16 ENCOUNTER — Encounter (HOSPITAL_COMMUNITY): Payer: Self-pay | Admitting: Psychiatry

## 2013-04-16 ENCOUNTER — Ambulatory Visit (INDEPENDENT_AMBULATORY_CARE_PROVIDER_SITE_OTHER): Payer: Medicare Other | Admitting: Psychiatry

## 2013-04-16 VITALS — BP 150/98 | Ht 68.0 in | Wt 299.0 lb

## 2013-04-16 DIAGNOSIS — F3189 Other bipolar disorder: Secondary | ICD-10-CM

## 2013-04-16 DIAGNOSIS — F319 Bipolar disorder, unspecified: Secondary | ICD-10-CM

## 2013-04-16 DIAGNOSIS — F1011 Alcohol abuse, in remission: Secondary | ICD-10-CM

## 2013-04-16 DIAGNOSIS — F39 Unspecified mood [affective] disorder: Secondary | ICD-10-CM

## 2013-04-16 MED ORDER — ESCITALOPRAM OXALATE 20 MG PO TABS: 20.0000 mg | ORAL_TABLET | Freq: Every day | ORAL | Status: DC

## 2013-04-16 MED ORDER — ARIPIPRAZOLE 10 MG PO TABS: 10.0000 mg | ORAL_TABLET | Freq: Every day | ORAL | Status: DC
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
## 2013-04-17 ENCOUNTER — Ambulatory Visit (INDEPENDENT_AMBULATORY_CARE_PROVIDER_SITE_OTHER): Payer: Medicare Other | Admitting: Psychiatry

## 2013-04-17 DIAGNOSIS — F319 Bipolar disorder, unspecified: Secondary | ICD-10-CM

## 2013-04-17 NOTE — Progress Notes (Signed)
Patient:  [name]   DOB: 08/21/1960  MR Number: [id number]  Location: [facility]:  [address] [place],  [place], [address]  Start: Tuesday 04/17/2013 11:00 AM End: Tuesday 04/17/2013 11:45 AM  Provider/Observer:     Florencia Reasons, MSW, LCSW   Chief Complaint:      Chief Complaint  Patient presents with  . Stress  . Anxiety    Reason For Service:     Patient has a long-standing history of bipolar disorder and has been seen in this practice for several years. She continues to experience periods of high stress, mood swings, and explosive outbursts at times. Patient is seen for a follow-up appointment.  Interventions Strategy:  Supportive therapy, cognitive behavioral therapy  Participation Level:   Active  Participation Quality:  Appropriate      Behavioral Observation:  Casual, alert,   Current Psychosocial Factors: Patient continues to experience ongoing marital stress and chronic pain  Content of Session:   Reviewing symptoms, processing feelings, reinforcing patient's efforts to improve self-care, identifying ways to maintain consistency regarding self-care efforts,identifying coping statements and compensatory tools  Current Status:   Patient reports improved mood, decreased worry, and increased motivation.  Patient Progress:   The patient reports continuing to feel better since last session. She states feeling calmer and thinking before she speaks. She reports no emotional or anger outbursts since last session. She continues to experience marital stress and is focusing efforts on improving self-care especially regarding her weight. Therapist works with patient to reinforce her efforts and to identify steps to maintain consistent effort. Patient expresses frustration with memory loss. Therapist works with patient to identify compensatory tools and to identify coping statements. Patient reports increased thoughts about leaving husband and has talked with an  attorney. Patient has considered moving in with son but says she does not want to be intrusive. She also needs help with daily physical care and states that she could not ask son for help with this. Therapist works with patient to explore options including possibly having a CNA.   Target Goals:   1. Increase and maintain consistent involvement in activity developing structure and routine. 1:1 psychotherapy one time every 1-4 weeks (supportive, cognitive behavioral therapy)    2. Improve ability to manage stress without becoming overwhelmed or having anger outbursts. 1:1 psychotherapy one time every 1-4 weeks (supportive, cognitive behavioral therapy)    3. Improve assertiveness skills and ability to set and maintain boundaries. 1:1 psychotherapy one time every 1-4 weeks (supportive, cognitive behavioral therapy)  Last Reviewed:   10/31/2012  Goals Addressed Today:    Goals 1, 2, and 3  Impression/Diagnosis:   The patient presents with a long-standing history of bipolar disorder and currently is experiencing increased anxiety, worry, mood swings, anger outbursts, and irritability. Diagnosis: bipolar disorder  Diagnosis:  Axis I:  Bipolar disorder          Axis II: Deferred

## 2013-04-17 NOTE — Patient Instructions (Signed)
Discussed orally 

## 2013-04-19 ENCOUNTER — Telehealth: Payer: Self-pay | Admitting: Neurology

## 2013-04-19 ENCOUNTER — Ambulatory Visit: Payer: Medicare Other | Admitting: Podiatry

## 2013-04-19 DIAGNOSIS — G4733 Obstructive sleep apnea (adult) (pediatric): Secondary | ICD-10-CM

## 2013-04-19 NOTE — Telephone Encounter (Signed)
Please call and notify the patient that the recent sleep study did confirm the diagnosis of obstructive sleep apnea and that I recommend treatment for this in the form of CPAP. Her sleep apnea is severe when she is in REM sleep and her oxygen drops to as low as 74%, and her baseline O2 sats were only 90%. Treatment with CPAP will require a repeat sleep study for proper titration and mask fitting. Please explain to patient and arrange for a CPAP titration study. I have placed an order in the chart. Thanks, Huston Foley, MD, PhD [facility])

## 2013-04-23 ENCOUNTER — Ambulatory Visit (INDEPENDENT_AMBULATORY_CARE_PROVIDER_SITE_OTHER): Payer: Medicare Other | Admitting: Podiatry

## 2013-04-23 ENCOUNTER — Encounter: Payer: Self-pay | Admitting: *Deleted

## 2013-04-23 ENCOUNTER — Encounter: Payer: Self-pay | Admitting: Podiatry

## 2013-04-23 VITALS — BP 166/96 | HR 79 | Resp 16

## 2013-04-23 DIAGNOSIS — M722 Plantar fascial fibromatosis: Secondary | ICD-10-CM

## 2013-04-23 MED ORDER — TRIAMCINOLONE ACETONIDE 10 MG/ML IJ SUSP
10.0000 mg | Freq: Once | INTRAMUSCULAR | Status: AC
  Administered 2013-04-23: 10 mg

## 2013-04-23 NOTE — Progress Notes (Signed)
Subjective:     Patient ID: [name], female   DOB: Aug 25, 1960, 52 y.o.   MRN: [id number]  HPI patient has pain in the bottom of the heel left coming from the outside versus the inside at this point   Review of Systems     Objective:   Physical Exam Neurovascular status intact with pain in the central and lateral fascially band noted    Assessment:     Plantar fasciitis left in obese female    Plan:     Injected the left lateral and central band from the lateral side 3 mg Kenalog 5 mg Xylocaine Marcaine mixture

## 2013-04-23 NOTE — Telephone Encounter (Signed)
I called and spoke with the patient about her sleep study results and that Dr. Frances Furbish recommends she start CPAP. I informed the patient that she would need to comeback into the lab for the CPAP Titration study. Patient has agreed and I will mail her a copy of the report and fax a copy to Dr. Anne Hahn and Dr. Lamar Blinks offices.

## 2013-04-23 NOTE — Patient Instructions (Signed)

## 2013-05-15 ENCOUNTER — Ambulatory Visit (INDEPENDENT_AMBULATORY_CARE_PROVIDER_SITE_OTHER): Payer: Medicare Other | Admitting: Psychiatry

## 2013-05-15 DIAGNOSIS — F319 Bipolar disorder, unspecified: Secondary | ICD-10-CM

## 2013-05-15 NOTE — Progress Notes (Addendum)
Patient:  [name]   DOB: 1961/01/01  MR Number: [id number]  Location: [place]:  [address] [place],  [place], [address]  Start: Tuesday 05/15/2012 11:00 AM End: Tuesday 05/15/2012 11:50 AM  Provider/Observer:     Maurice Small, MSW, LCSW   Chief Complaint:      Chief Complaint  Patient presents with  . Stress  . Anxiety    Reason For Service:     Patient has a long-standing history of bipolar disorder and has been seen in this practice for several years. She continues to experience periods of high stress, mood swings, and explosive outbursts at times. Patient is seen for a follow-up appointment.  Interventions Strategy:  Supportive therapy, cognitive behavioral therapy  Participation Level:   Active  Participation Quality:  Appropriate      Behavioral Observation:  Casual, alert, loud, talkative  Current Psychosocial Factors: Patient continues to experience ongoing marital stress and chronic pain  Content of Session:   Reviewing symptoms, processing feelings, encouraging patient toimprove self-care, identifying ways to maintain consistency regarding self-care efforts,identifying coping statements and compensatory tools  Current Status:   Patient reports increased irritability, anxiety, and worry.  Patient Progress:   The patient reports increased irritability and stress but denies any emotional outbursts since last session. She expresses frustration regarding her husband who has been more argumentative. She has decided that she cannot leave her husband at this time due to economic reasons. Therapist works with patient to process her feelings and to identify areas within patient's control regarding improving self-care and self nurture.She also expresses frustration regarding her weight. She is experiencing some anxiety as she has an appointment on 05/24/2013 to have her lap band tightened. Therapist works with patient to review relaxation techniques and  encourages patient to resume self-care efforts regarding nutrition and exercise within patient's ability. Therapist also works with patient to identify triggers of emotional eating and ways to intervene.   Target Goals:   1. Increase and maintain consistent involvement in activity developing structure and routine. 1:1 psychotherapy one time every 1-4 weeks (supportive, cognitive behavioral therapy)    2. Improve ability to manage stress without becoming overwhelmed or having anger outbursts. 1:1 psychotherapy one time every 1-4 weeks (supportive, cognitive behavioral therapy)    3. Improve assertiveness skills and ability to set and maintain boundaries. 1:1 psychotherapy one time every 1-4 weeks (supportive, cognitive behavioral therapy)  Last Reviewed:   10/31/2012  Goals Addressed Today:    Goals 1, 2, and 3  Impression/Diagnosis:   The patient presents with a long-standing history of bipolar disorder and currently is experiencing increased anxiety, worry, mood swings, anger outbursts, and irritability. Diagnosis: bipolar disorder  Diagnosis:  Axis I:  Bipolar disorder          Axis II: Deferred

## 2013-05-15 NOTE — Patient Instructions (Signed)
Discussed orally 

## 2013-05-21 ENCOUNTER — Ambulatory Visit (INDEPENDENT_AMBULATORY_CARE_PROVIDER_SITE_OTHER): Payer: Medicare Other

## 2013-05-21 DIAGNOSIS — G4733 Obstructive sleep apnea (adult) (pediatric): Secondary | ICD-10-CM

## 2013-05-21 DIAGNOSIS — G4730 Sleep apnea, unspecified: Secondary | ICD-10-CM

## 2013-05-21 DIAGNOSIS — G479 Sleep disorder, unspecified: Secondary | ICD-10-CM

## 2013-05-21 DIAGNOSIS — G4710 Hypersomnia, unspecified: Secondary | ICD-10-CM

## 2013-05-21 DIAGNOSIS — G4761 Periodic limb movement disorder: Secondary | ICD-10-CM

## 2013-05-24 ENCOUNTER — Ambulatory Visit (INDEPENDENT_AMBULATORY_CARE_PROVIDER_SITE_OTHER): Payer: Medicare Other | Admitting: Physician Assistant

## 2013-05-24 ENCOUNTER — Encounter (INDEPENDENT_AMBULATORY_CARE_PROVIDER_SITE_OTHER): Payer: Self-pay

## 2013-05-24 VITALS — BP 152/100 | HR 63 | Temp 97.1°F | Resp 16 | Ht 67.0 in | Wt 300.0 lb

## 2013-05-24 DIAGNOSIS — Z4651 Encounter for fitting and adjustment of gastric lap band: Secondary | ICD-10-CM

## 2013-05-24 NOTE — Patient Instructions (Signed)

## 2013-05-24 NOTE — Progress Notes (Signed)
  HISTORY: [name] is a 53 y.o.female who received an AP-Large lap-band in November 2009 by Dr. Lucia Gaskins. She comes in with 15 lbs weight gain since her last visit in September 2014. She had 2.5 mL fluid removed due to persistent vomiting following a stomach virus. She was asked to return in three weeks but didn't return till today. She is now 11 lbs over her pre-op weight. She is having to deal with worsening of her comorbidites including hypertension and OSA. She denies regurgitation or reflux. She wants to get back on track with weight loss.  VITAL SIGNS: Filed Vitals:   05/24/13 1006  BP: 152/100  Pulse: 63  Temp: 97.1 F (36.2 C)  Resp: 16    PHYSICAL EXAM: Physical exam reveals a very well-appearing 52 y.o.female in no apparent distress Neurologic: Awake, alert, oriented Psych: Bright affect, conversant Respiratory: Breathing even and unlabored. No stridor or wheezing Abdomen: Soft, nontender, nondistended to palpation. Incisions well-healed. No incisional hernias. Port easily palpated. Extremities: Atraumatic, good range of motion.  ASSESMENT: 53 y.o.  female  s/p AP-Large lap-band.   PLAN: The patient's port was accessed with a 20G Huber needle without difficulty. Clear fluid was aspirated and 1.5 mL saline was added to the port to give a total predicted volume of 6 mL. The patient was able to swallow water without difficulty following the procedure and was instructed to take clear liquids for the next 24-48 hours and advance slowly as tolerated. I reiterated the importance of wise food choices and eating slowly at the table to avoid obstruction. I also stressed the importance of close follow-up for successful weight loss. I've asked her to return in one month without fail. She agreed to do so.

## 2013-05-30 ENCOUNTER — Telehealth: Payer: Self-pay | Admitting: Neurology

## 2013-05-30 DIAGNOSIS — G4733 Obstructive sleep apnea (adult) (pediatric): Secondary | ICD-10-CM

## 2013-05-30 NOTE — Telephone Encounter (Signed)
Please call and inform patient that I have entered an order for treatment with PAP. She did well during the latest sleep study with CPAP. We will, therefore, arrange for a machine for home use through a DME (durable medical equipment) company of Her choice; and I will see the patient back in follow-up in about 6 weeks. Please also explain to the patient that I will be looking out for compliance data downloaded from the machine, which can be done remotely through a modem at times or stored on an SD card in the back of the machine. At the time of the followup appointment we will discuss sleep study results and how it is going with PAP treatment at home. Please advise patient to bring Her machine at the time of the visit; at least for the first visit, even though this is cumbersome. Bringing the machine for every visit after that may not be needed, but often helps for the first visit. Please also make sure, the patient has a follow-up appointment with me in about 6 weeks from the setup date, thanks.   Efrata Brunner, MD, PhD [facility] ([facility])  

## 2013-05-31 ENCOUNTER — Encounter: Payer: Self-pay | Admitting: *Deleted

## 2013-05-31 NOTE — Telephone Encounter (Signed)
I called and spoke with the patient about her recent CPAP titration study results. I informed the patient that she did well on CPAP during the night of her study and Dr. Rexene Alberts recommend CPAP therapy at home. I will send the order to [facility] and they'll contact her once they receive benefits for the CPAP machine. I will fax a copy of the report to Dr.Willis and Dr. Loraine Leriche offices. I will mail a copy of the report along with a follow up instruction letter to the patient

## 2013-06-06 ENCOUNTER — Other Ambulatory Visit: Payer: Self-pay

## 2013-06-06 MED ORDER — RIZATRIPTAN BENZOATE 10 MG PO TBDP: 10.0000 mg | ORAL_TABLET | ORAL | Status: DC | PRN

## 2013-06-11 ENCOUNTER — Ambulatory Visit (INDEPENDENT_AMBULATORY_CARE_PROVIDER_SITE_OTHER): Payer: Medicare Other | Admitting: Psychiatry

## 2013-06-11 DIAGNOSIS — F319 Bipolar disorder, unspecified: Secondary | ICD-10-CM

## 2013-06-11 NOTE — Progress Notes (Signed)
Patient:  [name]   DOB: 1961-02-16  MR Number: [id number]  Location: [place]:  [address] [place],  [place], [address]  Start: Monday 06/11/2013 11:00 AM End: Monday 06/11/2013 11:50 AM  Provider/Observer:     Maurice Small, MSW, LCSW   Chief Complaint:      Chief Complaint  Patient presents with  . Stress  . Anxiety    Reason For Service:     Patient has a long-standing history of bipolar disorder and has been seen in this practice for several years. She continues to experience periods of high stress, mood swings, and explosive outbursts at times. Patient is seen for a follow-up appointment.  Interventions Strategy:  Supportive therapy, cognitive behavioral therapy  Participation Level:   Active  Participation Quality:  Appropriate      Behavioral Observation:  Casual, alert, loud, talkative  Current Psychosocial Factors: Patient continues to experience ongoing marital stress and chronic pain  Content of Session:   Reviewing symptoms, processing feelings, reinforcing patient's use of assertiveness and coping skills  Current Status:   Patient reports improved mood,  decreased irritability, decreased anxiety, and decreased worry.  Patient Progress:   The patient reports feeling better emotionally since last session. She states finally learning she can only change herself and deciding to improve emotional health as she is already had enough difficulty with her physical health. She recently was prescribed medication for high blood pressure. Patient states trying to remain calm friends frustrated with her husband. She has been assertive and has set and maintained boundaries. She has not had any emotional outburst since last session. She reports having stable mood and rating anxiety as well as depression at 5 on a 10 point scale. She continues to experience memory difficulty but reports improved concentration. Patient is more optimistic about weight loss efforts  as she recently has lost 6 pounds and now will be seeing her doctor who performed lap band surgery every 4 weeks to help patient become more accountable regarding weight management. She is experiencing more interest in activities including reading.  Target Goals:   1. Increase and maintain consistent involvement in activity developing structure and routine. 1:1 psychotherapy one time every 1-4 weeks (supportive, cognitive behavioral therapy)    2. Improve ability to manage stress without becoming overwhelmed or having anger outbursts. 1:1 psychotherapy one time every 1-4 weeks (supportive, cognitive behavioral therapy)    3. Improve assertiveness skills and ability to set and maintain boundaries. 1:1 psychotherapy one time every 1-4 weeks (supportive, cognitive behavioral therapy)  Last Reviewed:   10/31/2012  Goals Addressed Today:    Goals 1, 2, and 3  Impression/Diagnosis:   The patient presents with a long-standing history of bipolar disorder and currently is experiencing increased anxiety, worry, mood swings, anger outbursts, and irritability. Diagnosis: bipolar disorder  Diagnosis:  Axis I:  Bipolar disorder          Axis II: Deferred

## 2013-06-11 NOTE — Patient Instructions (Signed)
Discussed orally 

## 2013-06-18 ENCOUNTER — Ambulatory Visit (INDEPENDENT_AMBULATORY_CARE_PROVIDER_SITE_OTHER): Payer: Medicare Other | Admitting: Psychiatry

## 2013-06-18 ENCOUNTER — Encounter (HOSPITAL_COMMUNITY): Payer: Self-pay | Admitting: Psychiatry

## 2013-06-18 VITALS — BP 150/100 | Ht 67.0 in | Wt 302.0 lb

## 2013-06-18 DIAGNOSIS — F319 Bipolar disorder, unspecified: Secondary | ICD-10-CM

## 2013-06-18 DIAGNOSIS — F3189 Other bipolar disorder: Secondary | ICD-10-CM

## 2013-06-18 DIAGNOSIS — F1011 Alcohol abuse, in remission: Secondary | ICD-10-CM

## 2013-06-18 DIAGNOSIS — F39 Unspecified mood [affective] disorder: Secondary | ICD-10-CM

## 2013-06-18 MED ORDER — ESCITALOPRAM OXALATE 20 MG PO TABS: 20.0000 mg | ORAL_TABLET | Freq: Every day | ORAL | Status: DC

## 2013-06-18 MED ORDER — ARIPIPRAZOLE 10 MG PO TABS: 10.0000 mg | ORAL_TABLET | Freq: Every day | ORAL | Status: DC
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
## 2013-06-28 ENCOUNTER — Encounter (INDEPENDENT_AMBULATORY_CARE_PROVIDER_SITE_OTHER): Payer: Medicare Other

## 2013-07-09 ENCOUNTER — Encounter: Payer: Self-pay | Admitting: Neurology

## 2013-07-09 ENCOUNTER — Ambulatory Visit (INDEPENDENT_AMBULATORY_CARE_PROVIDER_SITE_OTHER): Payer: Medicare Other | Admitting: Neurology

## 2013-07-09 VITALS — BP 164/94 | HR 92 | Temp 98.1°F | Ht 66.0 in | Wt 307.0 lb

## 2013-07-09 DIAGNOSIS — E669 Obesity, unspecified: Secondary | ICD-10-CM

## 2013-07-09 DIAGNOSIS — G43909 Migraine, unspecified, not intractable, without status migrainosus: Secondary | ICD-10-CM

## 2013-07-09 DIAGNOSIS — G4733 Obstructive sleep apnea (adult) (pediatric): Secondary | ICD-10-CM

## 2013-07-09 DIAGNOSIS — F39 Unspecified mood [affective] disorder: Secondary | ICD-10-CM

## 2013-07-09 DIAGNOSIS — R413 Other amnesia: Secondary | ICD-10-CM

## 2013-07-09 DIAGNOSIS — Z9989 Dependence on other enabling machines and devices: Principal | ICD-10-CM

## 2013-07-09 NOTE — Progress Notes (Signed)
Subjective:    Patient ID: [name] is a 53 y.o. female.  HPI    [name], MD, PhD [facility] [address], [address] [place], [place] [address]  Dear [name],   I saw your patient, [name], upon your kind request in my neurologic clinic today for initial consultation of her sleep disorder, in particular her OSA. The patient is unaccompanied today. As you know, [name] is a 53 year old right-handed woman with a complex medical history of bipolar disorder, migraine headaches, obesity, s/p lap band surgery with total of 80 lb weight loss in one year, but regain of weight, memory problems, colonic polyps, diabetes, anxiety, reflux disease, constipation osteoarthritis, who was referred for sleep study by you. She had a baseline sleep study on 04/09/2013 which demonstrated a total AHI of 14.5 per hour, supine AHI of 22.6 per hour, and no REM sleep related AHI of 47.4 per hour with an oxyhemoglobin desaturation nadir of 74%. She was therefore asked to come back for a CPAP titration study. She had the study on 05/21/2013. I told her about her to test results. During the CPAP titration study she had a sleep efficiency of 82.8%. Arousal index was normal. She had an increased percentage of stage II sleep, absence of slow-wave sleep, and an increased percentage of REM sleep at 31.8% with a mildly prolonged REM latency of 134.5 minutes. She had mild periodic leg movements with very little arousals. She was titrated on CPAP using a nasal pillows mask. On a pressure of 10 cm her residual AHI was 5.2 per hour. I prescribed CPAP therapy for her. Today, I reviewed her compliance data from her machine: 06/08/2013 through 07/08/2013 which is a total of 31 days during which time she is CPAP every night. Percent used days greater than 4 hours was 94%, indicating excellent compliance. Residual AHI was 1.5 per hour and leak was very low. Pressure is 10 cm with CPR  of 3. Average usage for all nights for 6 hours and 35 minutes. Today, she reports having been able to dream since on CPAP. She is no longer falling asleep during the day. She has been able to sleep in her bed as opposed to the recliner. She feels her sleep quality is better. She goes to bed between 10 and 11 PM and rises at 6 to 7 AM. She has to go to the bathroom less frequently now, one to 2 times as opposed to 3-4 time per night before. She has had some RLS Sx, but milder now.    Her Past Medical History Is Significant For: Past Medical History  Diagnosis Date  . Diabetes mellitus   . Migraine   . Arthritis   . Constipation   . Generalized headaches   . Diverticula of colon 2009  . Adenomatous polyp 2009  . Anxiety   . Depression   . Pelvic floor dysfunction     abnormal anorectal manometry at [facility] in 2009  . Diabetes mellitus, type II   . Psoriasis   . Bipolar 1 disorder   . Obesity     Her Past Surgical History Is Significant For: Past Surgical History  Procedure Laterality Date  . Colon surgery      complicated diverticulitis requiring sigmoid resection with colostomy and subsequent takedown  . Laparoscopic gastric banding  2009  . Heel spur surgery    . Abdominal hysterectomy    . Knee surgery      3  arthroscopic   . Colonoscopy  12/11/2007    Dr. Gala Romney- marginal prep, normal rectum pancolonic diverticula, adenomatous polyp  . Tubal ligation    . Colonoscopy  08/28/2003       Wide open colonic anastomosis/Scattered diverticula noted throughout colon/ Small external hemorrhoids  . Colon resection    03/30/2002    with end-colostomy and Hartmann's pouch  . Laparoscopic salpingoopherectomy  03/30/2002  . Colostomy closure  07/10/2002  . Knee arthroscopy  02/04/2004     left knee/partial medial meniscectomy.  . Flexible sigmoidoscopy  01/20/2012    Procedure: FLEXIBLE SIGMOIDOSCOPY;  Surgeon: Daneil Dolin, MD;  Location: [facility];  Service: Endoscopy;  Laterality:  N/A;    Her Family History Is Significant For: Family History  Problem Relation Age of Onset  . Ovarian cancer Mother   . Heart disease Mother   . Anxiety disorder Mother   . Cirrhosis Father     deceased age 86  . Alcohol abuse Father   . Colon cancer Other     aunt, deceased age 27  . Breast cancer Other     aunt, deceased age 41  . Liver cancer Cousin     age 87, deceased  . Drug abuse Cousin   . ADD / ADHD Son   . Anxiety disorder Sister   . Dementia Maternal Grandfather   . Bipolar disorder Neg Hx   . Depression Neg Hx   . OCD Neg Hx   . Paranoid behavior Neg Hx   . Schizophrenia Neg Hx   . Seizures Neg Hx   . Sexual abuse Neg Hx   . Physical abuse Neg Hx     Her Social History Is Significant For: History   Social History  . Marital Status: Married    Spouse Name: N/A    Number of Children: 2  . Years of Education: college   Occupational History  . student at [employer]   .     Social History Main Topics  . Smoking status: Never Smoker   . Smokeless tobacco: Never Used  . Alcohol Use: No     Comment: occasionally  . Drug Use: No  . Sexual Activity: Not Currently   Other Topics Concern  . None   Social History Narrative  . None    Her Allergies Are:  Allergies  Allergen Reactions  . Bactrim Itching, Nausea And Vomiting and Other (See Comments)    Redness  . Neurontin [Gabapentin] Palpitations and Other (See Comments)    confused  . Codeine Nausea And Vomiting and Rash  :   Her Current Medications Are:  Outpatient Encounter Prescriptions as of 07/09/2013  Medication Sig  . ARIPiprazole (ABILIFY) 10 MG tablet Take 1 tablet (10 mg total) by mouth at bedtime. CALL back with the dose that works the best for you.  . Calcium Carb-Cholecalciferol 500-600 MG-UNIT CHEW Chew 1 tablet by mouth 3 (three) times daily.  Marland Kitchen conjugated estrogens (PREMARIN) vaginal cream Place vaginally daily.  . cycloSPORINE (RESTASIS) 0.05 % ophthalmic emulsion Place 1 drop  into both eyes 2 (two) times daily.  . cyproheptadine (PERIACTIN) 4 MG tablet Take 1 tablet (4 mg total) by mouth as needed (for the spice of life).  . diclofenac (VOLTAREN) 75 MG EC tablet Take 75 mg by mouth 2 (two) times daily.  Marland Kitchen escitalopram (LEXAPRO) 20 MG tablet Take 1 tablet (20 mg total) by mouth daily.  Marland Kitchen exenatide (BYETTA) 10 MCG/0.04ML SOLN Inject 10 mcg into  the skin 2 (two) times daily with a meal.  . fluticasone (FLONASE) 50 MCG/ACT nasal spray Place 2 sprays into the nose daily as needed. Allergies  . loratadine (CLARITIN) 10 MG tablet Take 10 mg by mouth daily.  Marland Kitchen lubiprostone (AMITIZA) 24 MCG capsule Take 24 mcg by mouth 2 (two) times daily with a meal.  . metFORMIN (GLUCOPHAGE) 500 MG tablet Take 500 mg by mouth 2 (two) times daily.  . Multiple Vitamin (MULTIVITAMIN PO) Take 1 tablet by mouth daily.   Marland Kitchen omeprazole (PRILOSEC) 20 MG capsule Take 20 mg by mouth daily as needed (acid reflux).   . pantoprazole (PROTONIX) 40 MG tablet Take 40 mg by mouth 2 (two) times daily.  . polyethylene glycol (MIRALAX / GLYCOLAX) packet Take 17 g by mouth daily.  Marland Kitchen Propylene Glycol-Glycerin (SOOTHE) 0.6-0.6 % SOLN Place 1 drop into both eyes 2 (two) times daily.  . rizatriptan (MAXALT-MLT) 10 MG disintegrating tablet Take 1 tablet (10 mg total) by mouth as needed for migraine. May repeat in 2 hours if needed  . topiramate (TOPAMAX) 50 MG tablet Take 1 tablet (50 mg total) by mouth 2 (two) times daily.  . traMADol (ULTRAM) 50 MG tablet Take 50 mg by mouth 2 (two) times daily.   :  Review of Systems:  Out of a complete 14 point review of systems, all are reviewed and negative with the exception of these symptoms as listed below:   Review of Systems  Constitutional: Positive for diaphoresis.  HENT: Positive for drooling and tinnitus.   Eyes: Positive for pain, redness and visual disturbance (diplopia).  Respiratory: Positive for chest tightness and shortness of breath.   Cardiovascular:  Positive for chest pain.  Gastrointestinal: Negative.   Endocrine: Positive for polydipsia and polyphagia.  Genitourinary: Positive for frequency.  Musculoskeletal: Positive for arthralgias, back pain, gait problem, joint swelling and myalgias.  Skin: Negative.   Allergic/Immunologic: Negative.   Neurological: Positive for dizziness, syncope, speech difficulty, weakness and headaches.       Memory loss  Hematological: Negative.   Psychiatric/Behavioral: Positive for behavioral problems, confusion, dysphoric mood, decreased concentration and agitation. The patient is nervous/anxious and is hyperactive.     Objective:  Neurologic Exam  Physical Exam Physical Examination:   Filed Vitals:   07/09/13 1502  BP: 164/94  Pulse: 92  Temp: 98.1 F (36.7 C)    General Examination: The patient is a very pleasant 53 y.o. female in no acute distress. She appears well-developed and well-nourished and well groomed.   HEENT: Normocephalic, atraumatic, pupils are equal, round and reactive to light and accommodation. Funduscopic exam is normal with sharp disc margins noted. Extraocular tracking is good without limitation to gaze excursion or nystagmus noted. Normal smooth pursuit is noted. Hearing is grossly intact. Tympanic membranes are clear bilaterally. Face is symmetric with normal facial animation and normal facial sensation. Speech is clear with no dysarthria noted. There is no hypophonia. There is no lip, neck/head, jaw or voice tremor. Neck is supple with full range of passive and active motion. There are no carotid bruits on auscultation. Oropharynx exam reveals: mild mouth dryness, adequate dental hygiene and moderate airway crowding, due to tonsils in place and narrow airway entry. Mallampati is class II. Tongue protrudes centrally and palate elevates symmetrically. Tonsils are 1+ in size. Neck size is 17 inches.   Chest: Clear to auscultation without wheezing, rhonchi or crackles  noted.  Heart: S1+S2+0, regular and normal without murmurs, rubs or gallops noted.  Abdomen: Soft, non-tender and non-distended with normal bowel sounds appreciated on auscultation.  Extremities: There is trace pitting edema in the distal lower extremities bilaterally. Pedal pulses are intact.  Skin: Warm and dry without trophic changes noted. There are no varicose veins.  Musculoskeletal: exam reveals no obvious joint deformities, tenderness or joint swelling or erythema.   Neurologically:  Mental status: The patient is awake, alert and oriented in all 4 spheres. Her immediate and remote memory, attention, language skills and fund of knowledge are appropriate. There is no evidence of aphasia, agnosia, apraxia or anomia. Speech is clear with normal prosody and enunciation. Thought process is linear. Mood is normal and affect is normal.  Cranial nerves II - XII are as described above under HEENT exam. In addition: shoulder shrug is normal with equal shoulder height noted. Motor exam: Normal bulk, strength and tone is noted. There is no drift, tremor or rebound. Romberg is negative. Reflexes are 1+ in the UEs and trace in the LEs. Babinski: Toes are flexor bilaterally. Fine motor skills and coordination: intact with normal finger taps, normal hand movements, normal rapid alternating patting, normal foot taps and normal foot agility.  Cerebellar testing: No dysmetria or intention tremor on finger to nose testing. Heel to shin is unremarkable bilaterally. There is no truncal or gait ataxia.  Sensory exam: intact to light touch, pinprick, vibration, temperature sense and proprioception in the upper and lower extremities.  Gait, station and balance: She stands with difficulty due to body habitus. No veering to one side is noted. No leaning to one side is noted. Posture is age-appropriate and stance is narrow based. Gait shows normal stride length and normal pace. No problems turning are noted. She turns  in 3 steps. Tandem walk is difficult for her.   Assessment and Plan:   In summary, CALEAH CERRILLO is a very pleasant 53 y.o.-year old female with a complex medical history of bipolar disorder, migraine headaches, obesity, s/p lap band surgery with total of 80 lb weight loss in one year, but regain of weight, memory problems, colonic polyps, diabetes, anxiety, reflux disease, constipation osteoarthritis, who has recently been confirmed to have obstructive sleep apnea. She is now on CPAP treatment a pressure of 10 cm and indicate good tolerance of the pressure in the mask in improvement of her sleep. She has had improvement in her daytime somnolence, her nocturia, and sleep consolidation. She recalls having dreams. She is fully compliant with treatment and I went over her to sleep study results with her in detail as well as the compliance data. I encouraged her to continue using CPAP regularly as she may still notice improvement in the next few months with regards to her mood disorder, her memory, and her pain level. She is discouraged about her weight gain and feels that a lot of her problems are weight related. I asked her to be more patients with fat and she is needing her bariatric surgeon this week. I do note that she had residual oxygen desaturations while on CPAP and a total of 16 minutes and 28 seconds below the saturation of 88% while on CPAP. This was of course during the entire titration and she has probably done better with that. Her residual AHI is low and her leak is low currently. Down the road, we can consider an overnight pulse oximetry test while she is on CPAP but I do believe that striving for weight loss will be of help for her as well in that  regard. I congratulated her on her great compliance and encouraged her to be more patient. I would like to see her back in about 3 months from now, sooner if the need arises. She will see you back next month for routine followup as well. I encouraged  her to continue to use CPAP regularly to help reduce cardiovascular risk.   We also talked about trying to maintaining a healthy lifestyle in general. I encouraged the patient to eat healthy, exercise daily and keep well hydrated, to keep a scheduled bedtime and wake time routine, to not skip any meals and eat healthy snacks in between meals and to have protein with every meal. I stressed the importance of regular exercise.   I answered all her questions today and the patient was in agreement with the above outlined plan. Thank you very much for allowing me to participate in the care of this nice lady,   Sincerely, [name], MD, PhD [facility] ([place])

## 2013-07-09 NOTE — Patient Instructions (Addendum)
Please continue using your CPAP regularly. While your insurance requires that you use CPAP at least 4 hours each night on 70% of the nights, I recommend, that you not skip any nights and use it throughout the night if you can. Getting used to CPAP does take time and patience and discipline. Untreated obstructive sleep apnea when it is moderate to severe can have an adverse impact on cardiovascular health and raise her risk for heart disease, arrhythmias, hypertension, congestive heart failure, stroke and diabetes. Untreated obstructive sleep apnea causes sleep disruption, nonrestorative sleep, and sleep deprivation. This can have an impact on your day to day functioning and cause daytime sleepiness and impairment of cognitive function, memory loss, mood disturbance, and problems focussing. Using CPAP regularly can improve these symptoms.  Follow up for sleep apnea in 3 months. You will work on weight loss. We may check your Oxygen level overnight while on CPAP in the near future.

## 2013-07-11 ENCOUNTER — Ambulatory Visit (INDEPENDENT_AMBULATORY_CARE_PROVIDER_SITE_OTHER): Payer: Medicare Other | Admitting: Psychiatry

## 2013-07-11 DIAGNOSIS — F319 Bipolar disorder, unspecified: Secondary | ICD-10-CM
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
## 2013-07-11 NOTE — Patient Instructions (Signed)
Discussed orally 

## 2013-07-12 ENCOUNTER — Encounter (INDEPENDENT_AMBULATORY_CARE_PROVIDER_SITE_OTHER): Payer: Medicare Other

## 2013-07-16 ENCOUNTER — Encounter: Payer: Self-pay | Admitting: Obstetrics and Gynecology

## 2013-07-17 ENCOUNTER — Encounter: Payer: Self-pay | Admitting: Neurology

## 2013-07-18 NOTE — Progress Notes (Signed)
Quick Note:  I reviewed the patient's CPAP compliance data from 06/08/2013 to 07/07/2013, which is a total of 30 days, during which time the patient used CPAP every day. The average usage for all days was 6 hours and 28 minutes. The percent used days greater than 4 hours was 93 %, indicating excellent compliance. The residual AHI was 1.5 per hour, indicating an appropriate treatment pressure of 10 cwp with EPR of 3. The air leak was very low. I will review this data with the patient at the next office visit, provide feedback and additional troubleshooting if need be. She is currently scheduled to see me on 11/26/2013 at 3 PM.  [name], MD, PhD [facility] ([facility])   ______

## 2013-07-26 ENCOUNTER — Ambulatory Visit (INDEPENDENT_AMBULATORY_CARE_PROVIDER_SITE_OTHER): Payer: Medicare Other | Admitting: Physician Assistant

## 2013-07-26 ENCOUNTER — Encounter (INDEPENDENT_AMBULATORY_CARE_PROVIDER_SITE_OTHER): Payer: Self-pay

## 2013-07-26 VITALS — BP 126/78 | HR 77 | Temp 98.5°F | Resp 16 | Ht 67.0 in | Wt 300.0 lb

## 2013-07-26 DIAGNOSIS — Z9884 Bariatric surgery status: Secondary | ICD-10-CM

## 2013-07-26 NOTE — Patient Instructions (Signed)
Return in one month. Focus on good food choices as well as physical activity. Return sooner if you have an increase in hunger, portion sizes or weight. Return also for difficulty swallowing, night cough, reflux.

## 2013-07-26 NOTE — Progress Notes (Signed)
  HISTORY: [name] is a 53 y.o.female who received an AP-Large lap-band in November 2009 by Dr. Lucia Gaskins. She comes in with stable weight for 2 months. We were supposed to see her a month ago but weather precluded her being seen. She has engaged in improved eating habits with avoidance of carbohydrates and watching total number of calories. She is also spending one hour in the pool four to five days a week.  VITAL SIGNS: Filed Vitals:   07/26/13 1605  BP: 126/78  Pulse: 77  Temp: 98.5 F (36.9 C)  Resp: 16    PHYSICAL EXAM: Physical exam reveals a very well-appearing 53 y.o.female in no apparent distress Neurologic: Awake, alert, oriented Psych: Bright affect, conversant Respiratory: Breathing even and unlabored. No stridor or wheezing Extremities: Atraumatic, good range of motion. Skin: Warm, Dry, no rashes Musculoskeletal: Normal gait, Joints normal  ASSESMENT: 53 y.o.  female  s/p AP-Large lap-band.   PLAN: The patient does not want a fill today for fear of obstruction as she nears 7 mL of volume. She wants to give her current regimen some more time to play out before a fill is done. She is planning on joining weight watchers more for the accountability component. I encouraged her to contact us should she need to be seen sooner.

## 2013-08-16 ENCOUNTER — Ambulatory Visit (HOSPITAL_COMMUNITY): Payer: Self-pay | Admitting: Psychiatry

## 2013-08-21 ENCOUNTER — Encounter (HOSPITAL_COMMUNITY): Payer: Self-pay | Admitting: Psychiatry

## 2013-08-21 ENCOUNTER — Ambulatory Visit (INDEPENDENT_AMBULATORY_CARE_PROVIDER_SITE_OTHER): Payer: Medicare Other | Admitting: Psychiatry

## 2013-08-21 VITALS — BP 160/100 | Ht 67.0 in | Wt 298.0 lb

## 2013-08-21 DIAGNOSIS — F39 Unspecified mood [affective] disorder: Secondary | ICD-10-CM

## 2013-08-21 DIAGNOSIS — F3189 Other bipolar disorder: Secondary | ICD-10-CM

## 2013-08-21 DIAGNOSIS — F603 Borderline personality disorder: Secondary | ICD-10-CM

## 2013-08-21 DIAGNOSIS — F319 Bipolar disorder, unspecified: Secondary | ICD-10-CM

## 2013-08-21 DIAGNOSIS — F1011 Alcohol abuse, in remission: Secondary | ICD-10-CM

## 2013-08-21 MED ORDER — ESCITALOPRAM OXALATE 20 MG PO TABS: 20.0000 mg | ORAL_TABLET | Freq: Every day | ORAL | Status: DC

## 2013-08-21 MED ORDER — ARIPIPRAZOLE 10 MG PO TABS: ORAL_TABLET | ORAL | Status: DC
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
## 2013-08-23 ENCOUNTER — Encounter: Payer: Self-pay | Admitting: Podiatry

## 2013-08-23 ENCOUNTER — Ambulatory Visit (INDEPENDENT_AMBULATORY_CARE_PROVIDER_SITE_OTHER): Payer: Medicare Other | Admitting: Podiatry

## 2013-08-23 ENCOUNTER — Encounter (INDEPENDENT_AMBULATORY_CARE_PROVIDER_SITE_OTHER): Payer: Medicare Other

## 2013-08-23 VITALS — BP 140/82 | HR 94 | Resp 16

## 2013-08-23 DIAGNOSIS — M722 Plantar fascial fibromatosis: Secondary | ICD-10-CM

## 2013-08-23 NOTE — Patient Instructions (Signed)
Pre-Operative Instructions  Congratulations, you have decided to take an important step to improving your quality of life.  You can be assured that the doctors of [facility] will be with you every step of the way.  1. Plan to be at the surgery center/hospital at least 1 (one) hour prior to your scheduled time unless otherwise directed by the surgical center/hospital staff.  You must have a responsible adult accompany you, remain during the surgery and drive you home.  Make sure you have directions to the surgical center/hospital and know how to get there on time. 2. For hospital based surgery you will need to obtain a history and physical form from your family physician within 1 month prior to the date of surgery- we will give you a form for you primary physician.  3. We make every effort to accommodate the date you request for surgery.  There are however, times where surgery dates or times have to be moved.  We will contact you as soon as possible if a change in schedule is required.   4. No Aspirin/Ibuprofen for one week before surgery.  If you are on aspirin, any non-steroidal anti-inflammatory medications (Mobic, Aleve, Ibuprofen) you should stop taking it 7 days prior to your surgery.  You make take Tylenol  For pain prior to surgery.  5. Medications- If you are taking daily heart and blood pressure medications, seizure, reflux, allergy, asthma, anxiety, pain or diabetes medications, make sure the surgery center/hospital is aware before the day of surgery so they may notify you which medications to take or avoid the day of surgery. 6. No food or drink after midnight the night before surgery unless directed otherwise by surgical center/hospital staff. 7. No alcoholic beverages 24 hours prior to surgery.  No smoking 24 hours prior to or 24 hours after surgery. 8. Wear loose pants or shorts- loose enough to fit over bandages, boots, and casts. 9. No slip on shoes, sneakers are best. 10. Bring  your boot with you to the surgery center/hospital.  Also bring crutches or a walker if your physician has prescribed it for you.  If you do not have this equipment, it will be provided for you after surgery. 11. If you have not been contracted by the surgery center/hospital by the day before your surgery, call to confirm the date and time of your surgery. 12. Leave-time from work may vary depending on the type of surgery you have.  Appropriate arrangements should be made prior to surgery with your employer. 13. Prescriptions will be provided immediately following surgery by your doctor.  Have these filled as soon as possible after surgery and take the medication as directed. 14. Remove nail polish on the operative foot. 15. Wash the night before surgery.  The night before surgery wash the foot and leg well with the antibacterial soap provided and water paying special attention to beneath the toenails and in between the toes.  Rinse thoroughly with water and dry well with a towel.  Perform this wash unless told not to do so by your physician.  Enclosed: 1 Ice pack (please put in freezer the night before surgery)   1 Hibiclens skin cleaner   Pre-op Instructions  If you have any questions regarding the instructions, do not hesitate to call our office.  [place]: [address] [place], [place] [address] [phone]  [place]: [address] [place], [place] [address] [phone]  [place]: [address]  [place], [place] [address] [phone]  Dr. Richard   Tuchman DPM, Dr. Norman Regal DPM Dr. Richard Sikora DPM, Dr. M. Todd Hyatt DPM, Dr. Kathryn Egerton DPM 

## 2013-08-24 NOTE — Progress Notes (Signed)
Subjective:     Patient ID: [name], female   DOB: [birth year], 53 y.o.   MRN: [id number]  HPI patient states that this left heel is killing me and something skin have to be done. States that she has tried so many different things nothing to working and it reached a point where she cannot do any form of activity and she needs to exercise to try to control weight   Review of Systems     Objective:   Physical Exam  Neurovascular status unchanged with patient stating that her sugar has been under good control and digital Fill time was noted to be immediate. I noted there to be exquisite discomfort in the plantar heel both medial central and lateral side with fluid buildup and inability to walk comfortably on the heel itself    Assessment:     Plantar fasciitis of the entire heel region left with fluid buildup    Plan:     Reviewed that we have not had her response to aggressive conservative care and at this point I do think consideration for surgery as they are to release the entire plantar fascia. I did explain the risk of this including chronic arch pain instability of the foot and other issues that can occur with surgery and allowed her to read a consent form [facility] with ample opportunities for questions. Patient wants surgery signed consent form and is given all instructions for the procedure and the fact she will be in a boot for at least 4 weeks and total recovery. We'll take 6 months to one year. Scheduled for surgery

## 2013-08-29 ENCOUNTER — Telehealth: Payer: Self-pay | Admitting: *Deleted

## 2013-08-29 NOTE — Telephone Encounter (Signed)
Patient returned my call.  She stated it's okay to move her surgery to 09/14/13.  She requested that she have an early morning appointment due to her being Diabetic.  I told her I would let the surgical center know.  I thanked her for being willing to change her surgery date.

## 2013-08-29 NOTE — Telephone Encounter (Signed)
Error in previous note, patient's surgery is being rescheduled from 09/04/13 to 09/11/13.  Surgical center was informed.

## 2013-08-29 NOTE — Telephone Encounter (Signed)
Per Dr. Paulla Dolly, I attempted to call the patient to see if we could move her surgery from 09/04/13 to [id number]/3338 due to conflict.  I asked that she return my call.

## 2013-09-03 ENCOUNTER — Ambulatory Visit: Payer: Medicare Other | Admitting: Neurology

## 2013-09-10 ENCOUNTER — Encounter: Payer: Medicare Other | Admitting: Podiatry

## 2013-09-11 DIAGNOSIS — M722 Plantar fascial fibromatosis: Secondary | ICD-10-CM

## 2013-09-11 HISTORY — PX: HEEL SPUR SURGERY: SHX665

## 2013-09-17 ENCOUNTER — Ambulatory Visit (INDEPENDENT_AMBULATORY_CARE_PROVIDER_SITE_OTHER): Payer: Medicare Other | Admitting: Podiatry

## 2013-09-17 ENCOUNTER — Encounter: Payer: Self-pay | Admitting: Podiatry

## 2013-09-17 DIAGNOSIS — M722 Plantar fascial fibromatosis: Secondary | ICD-10-CM

## 2013-09-17 NOTE — Progress Notes (Signed)
Subjective:     Patient ID: [name], female   DOB: 19-Nov-1960, 53 y.o.   MRN: [id number]  HPI patient states that her heel feels pretty good but is achy at the end of the day. 6 days after endoscopic heel surgery   Review of Systems     Objective:   Physical Exam Neurovascular status intact with patient's incision sites on the left heel doing very well with wound edges coapted and stitches in place and negative Homans sign noted    Assessment:     Patient's doing well post endoscopic release of the fascial left    Plan:     Discussed condition and reapplied sterile dressing. Reappoint 2 weeks for suture removal earlier if any issues should occur

## 2013-09-18 ENCOUNTER — Other Ambulatory Visit: Payer: Self-pay

## 2013-09-18 MED ORDER — RIZATRIPTAN BENZOATE 10 MG PO TBDP: 10.0000 mg | ORAL_TABLET | ORAL | Status: DC | PRN

## 2013-09-24 ENCOUNTER — Telehealth: Payer: Self-pay | Admitting: *Deleted

## 2013-09-24 NOTE — Telephone Encounter (Signed)
Pt states still some numbness in her surgery foot for 0/505/2015.  I told pt the sensation would gradually return, make certain her edema anklet/ace wrap were not too tight.  Pt agreed.

## 2013-10-08 ENCOUNTER — Encounter: Payer: Self-pay | Admitting: Podiatry

## 2013-10-08 ENCOUNTER — Ambulatory Visit (INDEPENDENT_AMBULATORY_CARE_PROVIDER_SITE_OTHER): Payer: Medicare Other | Admitting: Podiatry

## 2013-10-08 VITALS — BP 120/80 | HR 90 | Resp 16

## 2013-10-08 DIAGNOSIS — M722 Plantar fascial fibromatosis: Secondary | ICD-10-CM

## 2013-10-10 NOTE — Progress Notes (Signed)
Subjective:     Patient ID: [name], female   DOB: 10-Sep-1960, 53 y.o.   MRN: [id number]  HPI patient states I'm doing well on my left foot with some discomfort if I stand to long.   Review of Systems     Objective:   Physical Exam Neurovascular status intact   with range of motion subtalar joint midtarsal joint adequate and incision sites on the medial lateral side of the left heel doing well with stitches intact and wound edges well coapted  Assessment:     Healing well from endoscopic surgery left    Plan:     H&P performed and advised on continued boot usage and gradual return to tennis shoes of the next 4 weeks with stitches been removed and dressings applied to the medial and lateral side of the foot along with compression

## 2013-10-11 ENCOUNTER — Ambulatory Visit: Payer: Medicare Other | Admitting: Neurology

## 2013-10-18 ENCOUNTER — Encounter: Payer: Self-pay | Admitting: Adult Health

## 2013-10-18 ENCOUNTER — Ambulatory Visit (INDEPENDENT_AMBULATORY_CARE_PROVIDER_SITE_OTHER): Payer: Medicare Other | Admitting: Adult Health

## 2013-10-18 VITALS — BP 146/87 | HR 89 | Temp 98.1°F | Ht 67.0 in | Wt 300.5 lb

## 2013-10-18 DIAGNOSIS — R413 Other amnesia: Secondary | ICD-10-CM

## 2013-10-18 DIAGNOSIS — Z9989 Dependence on other enabling machines and devices: Secondary | ICD-10-CM

## 2013-10-18 DIAGNOSIS — G43019 Migraine without aura, intractable, without status migrainosus: Secondary | ICD-10-CM

## 2013-10-18 DIAGNOSIS — G4733 Obstructive sleep apnea (adult) (pediatric): Secondary | ICD-10-CM

## 2013-10-18 MED ORDER — TOPIRAMATE 50 MG PO TABS: 50.0000 mg | ORAL_TABLET | Freq: Two times a day (BID) | ORAL | Status: DC

## 2013-10-18 NOTE — Progress Notes (Signed)
PATIENT: [name] DOB: Sep 22, 1960  REASON FOR VISIT: follow up HISTORY FROM: patient  HISTORY OF PRESENT ILLNESS: [name] is a 53 year old left-handed white female with a history of bipolar disorder, migraine headache, sleep apnea and memory deficit. She returns today for follow-up. Patient uses CPAP nightly. She states that her memory has improved with CPAP. She continues to have a hard time when watching movies, she can't remember what happens in them. She still has some problems with word finding. She relates it to stress, due to her husband's PTSD. Patient states that her headaches are not has bad. She has approximately 2-3 headaches per month. Patient uses Maxalt and Topamax for headaches and is tolerating it well. Patient continues to have blurry vision but she relates this to her diabetes. Patient just has surgery for plantar fasciitis, three weeks ago.   REVIEW OF SYSTEMS: Full 14 system review of systems performed and notable only for:  Constitutional: N/A  Eyes: N/A Ear/Nose/Throat: N/A  Skin: N/A  Cardiovascular: N/A  Respiratory: N/A  Gastrointestinal: N/A  Genitourinary: N/A Hematology/Lymphatic: N/A  Endocrine: N/A Musculoskeletal:N/A  Allergy/Immunology: N/A  Neurological: N/A Psychiatric: N/A Sleep: N/A   ALLERGIES: Allergies  Allergen Reactions  . Bactrim Itching, Nausea And Vomiting and Other (See Comments)    Redness  . Neurontin [Gabapentin] Palpitations and Other (See Comments)    confused  . Codeine Nausea And Vomiting and Rash    HOME MEDICATIONS: Outpatient Prescriptions Prior to Visit  Medication Sig Dispense Refill  . ARIPiprazole (ABILIFY) 10 MG tablet Take one at bedtime  30 tablet  2  . Calcium Carb-Cholecalciferol 500-600 MG-UNIT CHEW Chew 1 tablet by mouth 3 (three) times daily.      Marland Kitchen conjugated estrogens (PREMARIN) vaginal cream Place vaginally daily.  42.5 g  12  . cycloSPORINE (RESTASIS) 0.05 % ophthalmic emulsion Place  1 drop into both eyes 2 (two) times daily.      . cyproheptadine (PERIACTIN) 4 MG tablet Take 1 tablet (4 mg total) by mouth as needed (for the spice of life).  30 tablet  2  . diclofenac (VOLTAREN) 75 MG EC tablet Take 75 mg by mouth 2 (two) times daily.      Marland Kitchen escitalopram (LEXAPRO) 20 MG tablet Take 1 tablet (20 mg total) by mouth daily.  30 tablet  2  . exenatide (BYETTA) 10 MCG/0.04ML SOLN Inject 10 mcg into the skin 2 (two) times daily with a meal.      . fluticasone (FLONASE) 50 MCG/ACT nasal spray Place 2 sprays into the nose daily as needed. Allergies      . lisinopril (PRINIVIL,ZESTRIL) 5 MG tablet Take 5 mg by mouth daily.      Marland Kitchen loratadine (CLARITIN) 10 MG tablet Take 10 mg by mouth daily.      Marland Kitchen lubiprostone (AMITIZA) 24 MCG capsule Take 24 mcg by mouth 2 (two) times daily with a meal.      . metFORMIN (GLUCOPHAGE) 500 MG tablet Take 500 mg by mouth 2 (two) times daily.      . Multiple Vitamin (MULTIVITAMIN PO) Take 1 tablet by mouth daily.       Marland Kitchen omeprazole (PRILOSEC) 20 MG capsule Take 20 mg by mouth daily as needed (acid reflux).       . pantoprazole (PROTONIX) 40 MG tablet Take 40 mg by mouth 2 (two) times daily.      . polyethylene glycol (MIRALAX / GLYCOLAX) packet Take 17 g by mouth daily.      Marland Kitchen  Propylene Glycol-Glycerin (SOOTHE) 0.6-0.6 % SOLN Place 1 drop into both eyes 2 (two) times daily.      . rizatriptan (MAXALT-MLT) 10 MG disintegrating tablet Take 1 tablet (10 mg total) by mouth as needed for migraine. May repeat in 2 hours if needed  30 tablet  0  . topiramate (TOPAMAX) 50 MG tablet Take 1 tablet (50 mg total) by mouth 2 (two) times daily.  60 tablet  0  . traMADol (ULTRAM) 50 MG tablet Take 50 mg by mouth 2 (two) times daily.        No facility-administered medications prior to visit.    PAST MEDICAL HISTORY: Past Medical History  Diagnosis Date  . Diabetes mellitus   . Migraine   . Arthritis   . Constipation   . Generalized headaches   . Diverticula of  colon 2009  . Adenomatous polyp 2009  . Anxiety   . Depression   . Pelvic floor dysfunction     abnormal anorectal manometry at [facility] in 2009  . Diabetes mellitus, type II   . Psoriasis   . Bipolar 1 disorder   . Obesity     PAST SURGICAL HISTORY: Past Surgical History  Procedure Laterality Date  . Colon surgery      complicated diverticulitis requiring sigmoid resection with colostomy and subsequent takedown  . Laparoscopic gastric banding  2009  . Heel spur surgery    . Abdominal hysterectomy    . Knee surgery      3 arthroscopic   . Colonoscopy  12/11/2007    Dr. Gala Romney- marginal prep, normal rectum pancolonic diverticula, adenomatous polyp  . Tubal ligation    . Colonoscopy  08/28/2003       Wide open colonic anastomosis/Scattered diverticula noted throughout colon/ Small external hemorrhoids  . Colon resection    03/30/2002    with end-colostomy and Hartmann's pouch  . Laparoscopic salpingoopherectomy  03/30/2002  . Colostomy closure  07/10/2002  . Knee arthroscopy  02/04/2004     left knee/partial medial meniscectomy.  . Flexible sigmoidoscopy  01/20/2012    Procedure: FLEXIBLE SIGMOIDOSCOPY;  Surgeon: Daneil Dolin, MD;  Location: [facility];  Service: Endoscopy;  Laterality: N/A;    FAMILY HISTORY: Family History  Problem Relation Age of Onset  . Ovarian cancer Mother   . Heart disease Mother   . Anxiety disorder Mother   . Cirrhosis Father     deceased age 42  . Alcohol abuse Father   . Colon cancer Other     aunt, deceased age 67  . Breast cancer Other     aunt, deceased age 62  . Liver cancer Cousin     age 84, deceased  . Drug abuse Cousin   . ADD / ADHD Son   . Anxiety disorder Sister   . Dementia Maternal Grandfather   . Bipolar disorder Neg Hx   . Depression Neg Hx   . OCD Neg Hx   . Paranoid behavior Neg Hx   . Schizophrenia Neg Hx   . Seizures Neg Hx   . Sexual abuse Neg Hx   . Physical abuse Neg Hx     SOCIAL HISTORY: History    Social History  . Marital Status: Married    Spouse Name: N/A    Number of Children: 2  . Years of Education: college   Occupational History  . student at [employer]   .     Social History Main Topics  . Smoking status: Never  Smoker   . Smokeless tobacco: Never Used  . Alcohol Use: No     Comment: occasionally  . Drug Use: No  . Sexual Activity: Not Currently   Other Topics Concern  . Not on file   Social History Narrative  . No narrative on file      PHYSICAL EXAM  There were no vitals filed for this visit. There is no weight on file to calculate BMI.  Generalized: Well developed, in no acute distress   Neurological examination  Mentation: Alert oriented to time, place, history taking. Follows all commands speech and language fluent. MMSE 27/30. Cranial nerve II-XII: Extraocular movements were full, visual field were full on confrontational test.  Motor: The motor testing reveals 5 over 5 strength of all 4 extremities. Good symmetric motor tone is noted throughout.  Sensory: Sensory testing is intact to soft touch on all 4 extremities. No evidence of extinction is noted.  Coordination: Cerebellar testing reveals good finger-nose-finger and heel-to-shin bilaterally.  Gait and station: patient in limping and uses a cane d/t recent foot surgery. Tandem gait not attempted.   Reflexes: Deep tendon reflexes are symmetric and normal bilaterally.    DIAGNOSTIC DATA (LABS, IMAGING, TESTING) - I reviewed patient records, labs, notes, testing and imaging myself where available.  Lab Results  Component Value Date   WBC 10.7* 01/21/2013   HGB 15.0 01/21/2013   HCT 44.3 01/21/2013   MCV 90.2 01/21/2013   PLT 193 01/21/2013      Component Value Date/Time   NA 138 01/21/2013 1327   K 4.0 01/21/2013 1327   CL 102 01/21/2013 1327   CO2 29 01/21/2013 1327   GLUCOSE 94 01/21/2013 1327   BUN 27* 01/21/2013 1327   CREATININE 0.61 01/21/2013 1327   CREATININE 0.74 03/23/2011 0400    CALCIUM 9.7 01/21/2013 1327   PROT 7.3 01/01/2013 1125   ALBUMIN 4.1 01/01/2013 1125   AST 30 01/01/2013 1125   ALT 30 01/01/2013 1125   ALKPHOS 98 01/01/2013 1125   BILITOT 0.3 01/01/2013 1125   GFRNONAA >90 01/21/2013 1327   GFRAA >90 01/21/2013 1327     Lab Results  Component Value Date   VITAMINB12 571 03/05/2013   Lab Results  Component Value Date   TSH 2.020 03/05/2013      ASSESSMENT AND PLAN 53 y.o. year old female  has a past medical history of Diabetes mellitus; Migraine; Arthritis; Constipation; Generalized headaches; Diverticula of colon (2009); Adenomatous polyp (2009); Anxiety; Depression; Pelvic floor dysfunction; Diabetes mellitus, type II; Psoriasis; Bipolar 1 disorder; and Obesity. here with   1. Migraine without aura, with intractable migraine, so stated, without mention of status migrainosus 2. Memory loss 3. OSA on CPAP  The patient has remained stable. Patient has approximately 2-3 headaches per month. Reports that Topamax and Maxalt are very beneficial. I will refill Topamax today. Patient's memory has improved. Today's MMSE 27/30. Patient feels that CPAP has improved her memory. Patient recently had a visit with Dr. Rexene Alberts, and had excellent compliance with CPAP. Patient should follow up in one year or sooner if needed.   Ward Givens, MSN, NP-C 10/18/2013, 11:43 AM [facility] [address] [place], [place] [address] [phone]  Note: This document was prepared with digital dictation and possible smart phrase technology. Any transcriptional errors that result from this process are unintentional.

## 2013-10-18 NOTE — Patient Instructions (Addendum)
Migraine Headache A migraine headache is an intense, throbbing pain on one or both sides of your head. A migraine can last for 30 minutes to several hours. CAUSES  The exact cause of a migraine headache is not always known. However, a migraine may be caused when nerves in the brain become irritated and release chemicals that cause inflammation. This causes pain. Certain things may also trigger migraines, such as:  Alcohol.  Smoking.  Stress.  Menstruation.  Aged cheeses.  Foods or drinks that contain nitrates, glutamate, aspartame, or tyramine.  Lack of sleep.  Chocolate.  Caffeine.  Hunger.  Physical exertion.  Fatigue.  Medicines used to treat chest pain (nitroglycerine), birth control pills, estrogen, and some blood pressure medicines. SIGNS AND SYMPTOMS  Pain on one or both sides of your head.  Pulsating or throbbing pain.  Severe pain that prevents daily activities.  Pain that is aggravated by any physical activity.  Nausea, vomiting, or both.  Dizziness.  Pain with exposure to bright lights, loud noises, or activity.  General sensitivity to bright lights, loud noises, or smells. Before you get a migraine, you may get warning signs that a migraine is coming (aura). An aura may include:  Seeing flashing lights.  Seeing bright spots, halos, or zig-zag lines.  Having tunnel vision or blurred vision.  Having feelings of numbness or tingling.  Having trouble talking.  Having muscle weakness. DIAGNOSIS  A migraine headache is often diagnosed based on:  Symptoms.  Physical exam.  A CT scan or MRI of your head. These imaging tests cannot diagnose migraines, but they can help rule out other causes of headaches. TREATMENT Medicines may be given for pain and nausea. Medicines can also be given to help prevent recurrent migraines.  HOME CARE INSTRUCTIONS  Only take over-the-counter or prescription medicines for pain or discomfort as directed by your  health care provider. The use of long-term narcotics is not recommended.  Lie down in a dark, quiet room when you have a migraine.  Keep a journal to find out what may trigger your migraine headaches. For example, write down:  What you eat and drink.  How much sleep you get.  Any change to your diet or medicines.  Limit alcohol consumption.  Quit smoking if you smoke.  Get 7 9 hours of sleep, or as recommended by your health care provider.  Limit stress.  Keep lights dim if bright lights bother you and make your migraines worse. SEEK IMMEDIATE MEDICAL CARE IF:   Your migraine becomes severe.  You have a fever.  You have a stiff neck.  You have vision loss.  You have muscular weakness or loss of muscle control.  You start losing your balance or have trouble walking.  You feel faint or pass out.  You have severe symptoms that are different from your first symptoms. MAKE SURE YOU:   Understand these instructions.  Will watch your condition.  Will get help right away if you are not doing well or get worse. Document Released: 04/26/2005 Document Revised: 02/14/2013 Document Reviewed: 01/01/2013 [facility] Patient Information 2014 [place]. Calorie Counting Diet A calorie counting diet requires you to eat the number of calories that are right for you in a day. Calories are the measurement of how much energy you get from the food you eat. Eating the right amount of calories is important for staying at a healthy weight. If you eat too many calories, your body will store them as fat and you may  gain weight. If you eat too few calories, you may lose weight. Counting the number of calories you eat during a day will help you know if you are eating the right amount. A Registered Dietitian can determine how many calories you need in a day. The amount of calories needed varies from person to person. If your goal is to lose weight, you will need to eat fewer calories. Losing  weight can benefit you if you are overweight or have health problems such as heart disease, high blood pressure, or diabetes. If your goal is to gain weight, you will need to eat more calories. Gaining weight may be necessary if you have a certain health problem that causes your body to need more energy. TIPS Whether you are increasing or decreasing the number of calories you eat during a day, it may be hard to get used to changes in what you eat and drink. The following are tips to help you keep track of the number of calories you eat.  Measure foods at home with measuring cups. This helps you know the amount of food and number of calories you are eating.  Restaurants often serve food in amounts that are larger than 1 serving. While eating out, estimate how many servings of a food you are given. For example, a serving of cooked rice is  cup or about the size of half of a fist. Knowing serving sizes will help you be aware of how much food you are eating at restaurants.  Ask for smaller portion sizes or child-size portions at restaurants.  Plan to eat half of a meal at a restaurant. Take the rest home or share the other half with a friend.  Read the Nutrition Facts panel on food labels for calorie content and serving size. You can find out how many servings are in a package, the size of a serving, and the number of calories each serving has.  For example, a package might contain 3 cookies. The Nutrition Facts panel on that package says that 1 serving is 1 cookie. Below that, it will say there are 3 servings in the container. The calories section of the Nutrition Facts label says there are 90 calories. This means there are 90 calories in 1 cookie (1 serving). If you eat 1 cookie you have eaten 90 calories. If you eat all 3 cookies, you have eaten 270 calories (3 servings x 90 calories = 270 calories). The list below tells you how big or small some common portion sizes are.  1 oz.........4 stacked  dice.  3 oz........Marland KitchenDeck of cards.  1 tsp.......Marland KitchenTip of little finger.  1 tbs......Marland KitchenMarland KitchenThumb.  2 tbs.......Marland KitchenGolf ball.   cup......Marland KitchenHalf of a fist.  1 cup.......Marland KitchenA fist. KEEP A FOOD LOG Write down every food item you eat, the amount you eat, and the number of calories in each food you eat during the day. At the end of the day, you can add up the total number of calories you have eaten. It may help to keep a list like the one below. Find out the calorie information by reading the Nutrition Facts panel on food labels. Breakfast  Bran cereal (1 cup, 110 calories).  Fat-free milk ( cup, 45 calories). Snack  Apple (1 medium, 80 calories). Lunch  Spinach (1 cup, 20 calories).  Tomato ( medium, 20 calories).  Chicken breast strips (3 oz, 165 calories).  Shredded cheddar cheese ( cup, 110 calories).  Light [place] dressing (2 tbs, 60 calories).  Whole-wheat  bread (1 slice, 80 calories).  Tub margarine (1 tsp, 35 calories).  Vegetable soup (1 cup, 160 calories). Dinner  Pork chop (3 oz, 190 calories).  Brown rice (1 cup, 215 calories).  Steamed broccoli ( cup, 20 calories).  Strawberries (1  cup, 65 calories).  Whipped cream (1 tbs, 50 calories). Daily Calorie Total: 6314 Document Released: 04/26/2005 Document Revised: 07/19/2011 Document Reviewed: 10/21/2006 [facility] Patient Information 2014 [place].

## 2013-10-18 NOTE — Progress Notes (Signed)
I have read the note, and I agree with the clinical assessment and plan.  Abdalla Naramore KEITH   

## 2013-10-19 ENCOUNTER — Telehealth: Payer: Self-pay | Admitting: Adult Health

## 2013-10-19 NOTE — Telephone Encounter (Signed)
Patient wanting to know can you test for Fibromyalgia?  Please call and advise

## 2013-10-19 NOTE — Telephone Encounter (Signed)
I called the patient. She wants to know if there is a test for fibromyalgia. I explained that there is no definitive testing for fibromyalgia. I explained it is usually diagnosed off of the patient's history and physical exam. However if she is having joint pain she should followup with her primary care provider to rule out other disease processes. Patient verbalized understanding.

## 2013-11-06 ENCOUNTER — Ambulatory Visit (INDEPENDENT_AMBULATORY_CARE_PROVIDER_SITE_OTHER): Payer: Medicare Other | Admitting: Psychiatry

## 2013-11-06 DIAGNOSIS — F3162 Bipolar disorder, current episode mixed, moderate: Secondary | ICD-10-CM

## 2013-11-06 NOTE — Patient Instructions (Signed)
Discussed orally 
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
## 2013-11-08 ENCOUNTER — Encounter (INDEPENDENT_AMBULATORY_CARE_PROVIDER_SITE_OTHER): Payer: Self-pay

## 2013-11-08 ENCOUNTER — Ambulatory Visit (INDEPENDENT_AMBULATORY_CARE_PROVIDER_SITE_OTHER): Payer: Medicare Other | Admitting: Physician Assistant

## 2013-11-08 VITALS — BP 140/84 | HR 72 | Temp 98.0°F | Resp 14 | Ht 67.0 in | Wt 303.2 lb

## 2013-11-08 DIAGNOSIS — Z4651 Encounter for fitting and adjustment of gastric lap band: Secondary | ICD-10-CM

## 2013-11-08 NOTE — Progress Notes (Signed)
  HISTORY: [name] is a 53 y.o.female who received an AP-Large lap-band in November 2009 by Dr. Lucia Gaskins. She has gained 3 lbs since her last visit in March, giving her 15 lb weight gain since surgery. She had foot surgery in May, rendering her unable to exercise until recently when she was cleared to swim. Psychosocial stressors, namely at home, are contributing. She denies regurgitation or reflux. She does believe she's eating more than necessary and would like a fill.  VITAL SIGNS: Filed Vitals:   11/08/13 1050  BP: 140/84  Pulse: 72  Temp: 98 F (36.7 C)  Resp: 14    PHYSICAL EXAM: Physical exam reveals a very well-appearing 53 y.o.female in no apparent distress Neurologic: Awake, alert, oriented Psych: Bright affect, conversant Respiratory: Breathing even and unlabored. No stridor or wheezing Abdomen: Soft, nontender, nondistended to palpation. Incisions well-healed. No incisional hernias. Port easily palpated. Extremities: Atraumatic, good range of motion.  ASSESMENT: 53 y.o.  female  s/p AP-Large lap-band.   PLAN: The patient's port was accessed with a 20G Huber needle without difficulty. Clear fluid was aspirated and 0.5 mL saline was added to the port to give a total predicted volume of 6 mL. The patient was able to swallow water without difficulty following the procedure and was instructed to take clear liquids for the next 24-48 hours and advance slowly as tolerated. She began having obstructive symptoms around 7 mL so we're being very careful not to approach this too closely.

## 2013-11-08 NOTE — Patient Instructions (Signed)

## 2013-11-15 ENCOUNTER — Ambulatory Visit: Payer: Medicare Other

## 2013-11-20 ENCOUNTER — Ambulatory Visit (HOSPITAL_COMMUNITY): Payer: Self-pay | Admitting: Psychiatry

## 2013-11-20 ENCOUNTER — Ambulatory Visit (INDEPENDENT_AMBULATORY_CARE_PROVIDER_SITE_OTHER): Payer: Medicare Other | Admitting: Psychiatry

## 2013-11-20 ENCOUNTER — Encounter (HOSPITAL_COMMUNITY): Payer: Self-pay | Admitting: Psychiatry

## 2013-11-20 VITALS — BP 120/80 | Ht 67.0 in | Wt 297.0 lb

## 2013-11-20 DIAGNOSIS — F3189 Other bipolar disorder: Secondary | ICD-10-CM

## 2013-11-20 DIAGNOSIS — G43909 Migraine, unspecified, not intractable, without status migrainosus: Secondary | ICD-10-CM

## 2013-11-20 DIAGNOSIS — F39 Unspecified mood [affective] disorder: Secondary | ICD-10-CM

## 2013-11-20 DIAGNOSIS — F3162 Bipolar disorder, current episode mixed, moderate: Secondary | ICD-10-CM

## 2013-11-20 MED ORDER — ESCITALOPRAM OXALATE 20 MG PO TABS: 20.0000 mg | ORAL_TABLET | Freq: Every day | ORAL | Status: DC

## 2013-11-20 MED ORDER — ARIPIPRAZOLE 10 MG PO TABS: ORAL_TABLET | ORAL | Status: DC
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
## 2013-11-26 ENCOUNTER — Ambulatory Visit: Payer: Medicare Other | Admitting: Neurology

## 2013-11-26 ENCOUNTER — Encounter: Payer: Medicare Other | Admitting: Podiatry

## 2013-11-27 ENCOUNTER — Telehealth: Payer: Self-pay | Admitting: *Deleted

## 2013-11-28 NOTE — Telephone Encounter (Signed)
Called pt and spoke with pt's husband Elberta Fortis and they stated that the pt has appts this week and would not be able to come in and I informed them that Dr. Rexene Alberts will be out of the office next week and if they could call back to schedule a sooner appt with Jinny Blossom, NP per Dr. Rexene Alberts. Husband verbalized understanding. FYI

## 2013-11-28 NOTE — Telephone Encounter (Signed)
Called pt and pt stated that she needed to get in to see Dr. Rexene Alberts sooner than May 24, 2014, because pt's appt was cancelled on July 20, Dr. Rexene Alberts was out of the office. Please advise

## 2013-11-28 NOTE — Telephone Encounter (Signed)
Patient can be seen by nurse practitioner, please see if she can be seen this week by either Jeani Hawking or Hoyle Sauer or Cos Cob for sleep apnea FU.

## 2013-11-29 ENCOUNTER — Ambulatory Visit: Payer: Self-pay | Admitting: Neurology

## 2013-12-04 ENCOUNTER — Ambulatory Visit (HOSPITAL_COMMUNITY): Payer: Self-pay | Admitting: Psychiatry

## 2013-12-07 ENCOUNTER — Telehealth: Payer: Self-pay | Admitting: *Deleted

## 2013-12-12 ENCOUNTER — Ambulatory Visit (INDEPENDENT_AMBULATORY_CARE_PROVIDER_SITE_OTHER): Payer: Medicare Other

## 2013-12-12 ENCOUNTER — Encounter: Payer: Self-pay | Admitting: Podiatry

## 2013-12-12 ENCOUNTER — Ambulatory Visit (INDEPENDENT_AMBULATORY_CARE_PROVIDER_SITE_OTHER): Payer: Medicare Other | Admitting: Podiatry

## 2013-12-12 VITALS — BP 144/85 | HR 67 | Resp 16

## 2013-12-12 DIAGNOSIS — R609 Edema, unspecified: Secondary | ICD-10-CM

## 2013-12-12 DIAGNOSIS — M7750 Other enthesopathy of unspecified foot: Secondary | ICD-10-CM

## 2013-12-12 DIAGNOSIS — R52 Pain, unspecified: Secondary | ICD-10-CM

## 2013-12-12 DIAGNOSIS — M79A9 Nontraumatic compartment syndrome of other sites: Secondary | ICD-10-CM | POA: Diagnosis not present

## 2013-12-12 MED ORDER — TRIAMCINOLONE ACETONIDE 10 MG/ML IJ SUSP
10.0000 mg | Freq: Once | INTRAMUSCULAR | Status: AC
  Administered 2013-12-12: 10 mg

## 2013-12-12 NOTE — Progress Notes (Signed)
Subjective:     Patient ID: [name], female   DOB: 1960-08-06, 53 y.o.   MRN: [id number]  HPI patient states my heel is feeling pretty good but I do get some pain in the outside of my left foot and I feel like I'm not quite walking right and the incision site can bother me at times on the outside   Review of Systems     Objective:   Physical Exam Obese female with neurovascular system that is intact and minimal discomfort of the plantar heel left. Patient has discomfort on the outside of the left foot around the peroneal longus and brevis and does have some inflammation around this area    Assessment:     Probable tendinitis secondary to obesity it change in gait pattern secondary to gradual improvement in heel    Plan:     Careful injection of the lateral peroneal complex with no problems and advised on supportive shoe gear into reappoint as needed

## 2013-12-14 ENCOUNTER — Other Ambulatory Visit (HOSPITAL_COMMUNITY): Payer: Self-pay | Admitting: Family Medicine

## 2013-12-14 DIAGNOSIS — Z1231 Encounter for screening mammogram for malignant neoplasm of breast: Secondary | ICD-10-CM

## 2013-12-18 ENCOUNTER — Ambulatory Visit (INDEPENDENT_AMBULATORY_CARE_PROVIDER_SITE_OTHER): Payer: Medicare Other | Admitting: Obstetrics & Gynecology

## 2013-12-18 ENCOUNTER — Encounter: Payer: Self-pay | Admitting: Obstetrics & Gynecology

## 2013-12-18 ENCOUNTER — Other Ambulatory Visit (HOSPITAL_COMMUNITY)
Admission: RE | Admit: 2013-12-18 | Discharge: 2013-12-18 | Disposition: A | Discharge status: Home / Self Care | Payer: Medicare Other | Source: Ambulatory Visit | Attending: Obstetrics & Gynecology | Admitting: Obstetrics & Gynecology

## 2013-12-18 ENCOUNTER — Telehealth: Payer: Self-pay | Admitting: Neurology

## 2013-12-18 VITALS — BP 100/70 | Ht 67.0 in | Wt 306.0 lb

## 2013-12-18 DIAGNOSIS — Z124 Encounter for screening for malignant neoplasm of cervix: Secondary | ICD-10-CM | POA: Insufficient documentation

## 2013-12-18 DIAGNOSIS — Z9989 Dependence on other enabling machines and devices: Principal | ICD-10-CM

## 2013-12-18 DIAGNOSIS — Z90711 Acquired absence of uterus with remaining cervical stump: Secondary | ICD-10-CM

## 2013-12-18 DIAGNOSIS — Z1151 Encounter for screening for human papillomavirus (HPV): Secondary | ICD-10-CM | POA: Insufficient documentation

## 2013-12-18 DIAGNOSIS — G4733 Obstructive sleep apnea (adult) (pediatric): Secondary | ICD-10-CM

## 2013-12-18 DIAGNOSIS — Z01419 Encounter for gynecological examination (general) (routine) without abnormal findings: Secondary | ICD-10-CM

## 2013-12-18 MED ORDER — FLUCONAZOLE 100 MG PO TABS: 100.0000 mg | ORAL_TABLET | Freq: Every day | ORAL | Status: DC

## 2013-12-18 NOTE — Progress Notes (Signed)
Patient ID: SRIYA KROEZE, female   DOB: 11/28/1960, 53 y.o.   MRN: [id number] Subjective:     [name] is a 53 y.o. female here for a routine exam.  No LMP recorded. Patient has had a hysterectomy. G2P2 Birth Control Method:  none Menstrual Calendar(currently): hyst  Current complaints: none.   Current acute medical issues:  none   Recent Gynecologic History No LMP recorded. Patient has had a hysterectomy. Last Pap: 2013,  normal Last mammogram: 2014,  normal  Past Medical History  Diagnosis Date  . Diabetes mellitus   . Migraine   . Arthritis   . Constipation   . Generalized headaches   . Diverticula of colon 2009  . Adenomatous polyp 2009  . Anxiety   . Depression   . Pelvic floor dysfunction     abnormal anorectal [facility]ometry at [facility] in 2009  . Diabetes mellitus, type II   . Psoriasis   . Bipolar 1 disorder   . Obesity     Past Surgical History  Procedure Laterality Date  . Colon surgery      complicated diverticulitis requiring sigmoid resection with colostomy and subsequent takedown  . Laparoscopic gastric banding  2009  . Heel spur surgery    . Abdominal hysterectomy    . Knee surgery      3 arthroscopic   . Colonoscopy  12/11/2007    Dr. Gala Romney- marginal prep, normal rectum pancolonic diverticula, adenomatous polyp  . Tubal ligation    . Colonoscopy  08/28/2003       Wide open colonic anastomosis/Scattered diverticula noted throughout colon/ Small external hemorrhoids  . Colon resection    03/30/2002    with end-colostomy and Hartmann's pouch  . Laparoscopic salpingoopherectomy  03/30/2002  . Colostomy closure  07/10/2002  . Knee arthroscopy  02/04/2004     left knee/partial medial meniscectomy.  . Flexible sigmoidoscopy  01/20/2012    Procedure: FLEXIBLE SIGMOIDOSCOPY;  Surgeon: Daneil Dolin, MD;  Location: [facility];  Service: Endoscopy;  Laterality: N/A;    OB History   Grav Para Term Preterm Abortions TAB SAB Ect Mult Living   2 2         2       History   Social History  . Marital Status: Married    Spouse Name: N/A    Number of Children: 2  . Years of Education: college   Occupational History  . student at [employer]   .     Social History Main Topics  . Smoking status: Never Smoker   . Smokeless tobacco: Never Used  . Alcohol Use: No     Comment: occasionally  . Drug Use: No  . Sexual Activity: Not Currently   Other Topics Concern  . None   Social History Narrative  . None    Family History  Problem Relation Age of Onset  . Ovarian cancer Mother   . Heart disease Mother   . Anxiety disorder Mother   . Cirrhosis Father     deceased age 37  . Alcohol abuse Father   . Colon cancer Other     aunt, deceased age 51  . Breast cancer Other     aunt, deceased age 62  . Liver cancer Cousin     age 56, deceased  . Drug abuse Cousin   . ADD / ADHD Son   . Anxiety disorder Sister   . Dementia Maternal Grandfather   . Bipolar disorder Neg Hx   .  Depression Neg Hx   . OCD Neg Hx   . Paranoid behavior Neg Hx   . Schizophrenia Neg Hx   . Seizures Neg Hx   . Sexual abuse Neg Hx   . Physical abuse Neg Hx      Review of Systems  Review of Systems  Constitutional: Negative for fever, chills, weight loss, malaise/fatigue and diaphoresis.  HENT: Negative for hearing loss, ear pain, nosebleeds, congestion, sore throat, neck pain, tinnitus and ear discharge.   Eyes: Negative for blurred vision, double vision, photophobia, pain, discharge and redness.  Respiratory: Negative for cough, hemoptysis, sputum production, shortness of breath, wheezing and stridor.   Cardiovascular: Negative for chest pain, palpitations, orthopnea, claudication, leg swelling and PND.  Gastrointestinal: negative for abdominal pain. Negative for heartburn, nausea, vomiting, diarrhea, constipation, blood in stool and melena.  Genitourinary: Negative for dysuria, urgency, frequency, hematuria and flank pain.  Musculoskeletal: Negative  for myalgias, back pain, joint pain and falls.  Skin: Negative for itching and rash.  Neurological: Negative for dizziness, tingling, tremors, sensory change, speech change, focal weakness, seizures, loss of consciousness, weakness and headaches.  Endo/Heme/Allergies: Negative for environmental allergies and polydipsia. Does not bruise/bleed easily.  Psychiatric/Behavioral: Negative for depression, suicidal ideas, hallucinations, memory loss and substance abuse. The patient is not nervous/anxious and does not have insomnia.        Objective:    Physical Exam  Vitals reviewed. Constitutional: She is oriented to person, place, and time. She appears well-developed and well-nourished.  HENT:  Head: Normocephalic and atraumatic.        Right Ear: External ear normal.  Left Ear: External ear normal.  Nose: Nose normal.  Mouth/Throat: Oropharynx is clear and moist.  Eyes: Conjunctivae and EOM are normal. Pupils are equal, round, and reactive to light. Right eye exhibits no discharge. Left eye exhibits no discharge. No scleral icterus.  Neck: Normal range of motion. Neck supple. No tracheal deviation present. No thyromegaly present.  Cardiovascular: Normal rate, regular rhythm, normal heart sounds and intact distal pulses.  Exam reveals no gallop and no friction rub.   No murmur heard. Respiratory: Effort normal and breath sounds normal. No respiratory distress. She has no wheezes. She has no rales. She exhibits no tenderness.  GI: Soft. Bowel sounds are normal. She exhibits no distension and no mass. There is no tenderness. There is no rebound and no guarding.  Genitourinary:  Breasts no masses skin changes or nipple changes bilaterally      Vulva is normal without lesions Vagina is pink moist without discharge Cervix normal in appearance and pap is done Uterus is absent Adnexa is absent Rectal    hemoccult negative, normal tone, no masses  Musculoskeletal: Normal range of motion. She  exhibits no edema and no tenderness.  Neurological: She is alert and oriented to person, place, and time. She has normal reflexes. She displays normal reflexes. No cranial nerve deficit. She exhibits normal muscle tone. Coordination normal.  Skin: Skin is warm and dry. No rash noted. No erythema. No pallor.  Psychiatric: She has a normal mood and affect. Her behavior is normal. Judgment and thought content normal.       Assessment:    Healthy female exam.    Plan:    Mammogram ordered. Follow up in: 1 year.

## 2013-12-18 NOTE — Telephone Encounter (Signed)
[facility] is requesting an updated order to renew all CPAP supplies:  Mask, headgear, tubing filters, water chamber, cushions/pillows, and chinstrap.

## 2013-12-20 ENCOUNTER — Encounter (INDEPENDENT_AMBULATORY_CARE_PROVIDER_SITE_OTHER): Payer: Medicare Other

## 2013-12-20 LAB — CYTOLOGY - PAP

## 2013-12-21 ENCOUNTER — Ambulatory Visit (INDEPENDENT_AMBULATORY_CARE_PROVIDER_SITE_OTHER): Payer: Medicare Other | Admitting: Psychiatry

## 2013-12-21 DIAGNOSIS — F3162 Bipolar disorder, current episode mixed, moderate: Secondary | ICD-10-CM
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
## 2013-12-24 NOTE — Patient Instructions (Signed)
Discussed orally 

## 2014-01-17 ENCOUNTER — Ambulatory Visit (HOSPITAL_COMMUNITY)
Admission: RE | Admit: 2014-01-17 | Discharge: 2014-01-17 | Disposition: A | Discharge status: Home / Self Care | Payer: Medicare Other | Source: Ambulatory Visit | Attending: Family Medicine | Admitting: Family Medicine

## 2014-01-17 DIAGNOSIS — Z1231 Encounter for screening mammogram for malignant neoplasm of breast: Secondary | ICD-10-CM | POA: Insufficient documentation

## 2014-01-18 ENCOUNTER — Ambulatory Visit (HOSPITAL_COMMUNITY): Payer: Self-pay | Admitting: Psychiatry

## 2014-01-25 ENCOUNTER — Encounter: Payer: Self-pay | Admitting: Neurology

## 2014-01-25 ENCOUNTER — Ambulatory Visit (INDEPENDENT_AMBULATORY_CARE_PROVIDER_SITE_OTHER): Payer: Medicare Other | Admitting: Neurology

## 2014-01-25 VITALS — BP 137/85 | HR 60 | Temp 97.8°F | Ht 66.0 in | Wt 306.0 lb

## 2014-01-25 DIAGNOSIS — F39 Unspecified mood [affective] disorder: Secondary | ICD-10-CM

## 2014-01-25 DIAGNOSIS — Z9989 Dependence on other enabling machines and devices: Principal | ICD-10-CM

## 2014-01-25 DIAGNOSIS — E669 Obesity, unspecified: Secondary | ICD-10-CM

## 2014-01-25 DIAGNOSIS — G43019 Migraine without aura, intractable, without status migrainosus: Secondary | ICD-10-CM

## 2014-01-25 DIAGNOSIS — G4733 Obstructive sleep apnea (adult) (pediatric): Secondary | ICD-10-CM

## 2014-01-25 NOTE — Patient Instructions (Signed)
Please continue using your CPAP regularly. While your insurance requires that you use CPAP at least 4 hours each night on 70% of the nights, I recommend, that you not skip any nights and use it throughout the night if you can. Getting used to CPAP and staying with the treatment long term does take time and patience and discipline. Untreated obstructive sleep apnea when it is moderate to severe can have an adverse impact on cardiovascular health and raise her risk for heart disease, arrhythmias, hypertension, congestive heart failure, stroke and diabetes. Untreated obstructive sleep apnea causes sleep disruption, nonrestorative sleep, and sleep deprivation. This can have an impact on your day to day functioning and cause daytime sleepiness and impairment of cognitive function, memory loss, mood disturbance, and problems focussing. Using CPAP regularly can improve these symptoms. Keep up the good work! I will see you back in 12 months.    

## 2014-01-25 NOTE — Progress Notes (Signed)
Subjective:    Patient ID: [name] is a 53 y.o. female.  HPI    Interim history:   Ms. Heyboer is a 53 year old right-handed woman with a complex medical history of bipolar disorder, migraine headaches, obesity, s/p lap band surgery, memory problems, colonic polyps, diabetes, anxiety, reflux disease, constipation and osteoarthritis, who presents for followup consultation of her obstructive sleep apnea. She is unaccompanied today. I first met her at the request of Dr. Jannifer Franklin on 07/09/2013, at which time we talked about her sleep studies including her baseline and her CPAP titration study as well as her CPAP compliance data. She reported having been able to dream since being on CPAP therapy and she had less daytime somnolence. She had been able to sleep in her bed as opposed to the recliner and felt that her sleep quality was much better. She had reduced nocturia and also reduced RLS symptoms.  I reviewed her compliance data from 10/22/2013 2 11/20/2013 which is a total of 30 days during which time she was CPAP every night with percent used days greater than 4 hours at 90%, average usage of 6 hours and 45 minutes, residual AHI low at 0.9 per hour and leak low at 3.2 L per minute at the 95th percentile, pressure at 10 cm with EPR of 3. Today I reviewed her compliance data from 12/24/2013 2 01/22/2014 which is a total of 30 days during which time she used her CPAP every night, percent used days greater than 4 hours was 97%, indicating excellent compliance, average usage of 6 hours and 50 minutes. Residual AHI low at 1.2 per hour, leak low at 4.5 L per minute at the 95th percentile. Pressure at 10 cm with EPR of 3. Today, she reports, that she continues to sleep well, but she has had issues with knee pain and foot pain, neuropathy seems worse. She had recent L foot surgery for plantar fasciitis.  She had a baseline sleep study on 04/09/2013 which demonstrated a total AHI of 14.5 per hour, supine  AHI of 22.6 per hour, and no REM sleep related AHI of 47.4 per hour with an oxyhemoglobin desaturation nadir of 74%. She had a CPAP titration study on 05/21/2013, which a sleep efficiency of 82.8%. Arousal index was normal. She had an increased percentage of stage II sleep, absence of slow-wave sleep, and an increased percentage of REM sleep at 31.8% with a mildly prolonged REM latency of 134.5 minutes. She had mild periodic leg movements with very little arousals. She was titrated on CPAP using a nasal pillows mask. On a pressure of 10 cm her residual AHI was 5.2 per hour. I prescribed CPAP therapy for her.  I reviewed her compliance data from 06/08/2013 through 07/08/2013 which is a total of 31 days during which time she used CPAP every night. Percent used days greater than 4 hours was 94%, indicating excellent compliance. Residual AHI was 1.5 per hour and leak was very low. Pressure is 10 cm with CPR of 3. Average usage for all nights for 6 hours and 35 minutes.    Her Past Medical History Is Significant For: Past Medical History  Diagnosis Date  . Diabetes mellitus   . Migraine   . Arthritis   . Constipation   . Generalized headaches   . Diverticula of colon 2009  . Adenomatous polyp 2009  . Anxiety   . Depression   . Pelvic floor dysfunction     abnormal anorectal [facility]ometry at [facility] in 2009  .  Diabetes mellitus, type II   . Psoriasis   . Bipolar 1 disorder   . Obesity   . Plantar fasciitis     Her Past Surgical History Is Significant For: Past Surgical History  Procedure Laterality Date  . Colon surgery      complicated diverticulitis requiring sigmoid resection with colostomy and subsequent takedown  . Laparoscopic gastric banding  2009  . Heel spur surgery  09/11/13  . Abdominal hysterectomy    . Knee surgery      3 arthroscopic   . Colonoscopy  12/11/2007    Dr. Gala Romney- marginal prep, normal rectum pancolonic diverticula, adenomatous polyp  . Tubal ligation    . Colonoscopy   08/28/2003       Wide open colonic anastomosis/Scattered diverticula noted throughout colon/ Small external hemorrhoids  . Colon resection    03/30/2002    with end-colostomy and Hartmann's pouch  . Laparoscopic salpingoopherectomy  03/30/2002  . Colostomy closure  07/10/2002  . Knee arthroscopy  02/04/2004     left knee/partial medial meniscectomy.  . Flexible sigmoidoscopy  01/20/2012    Procedure: FLEXIBLE SIGMOIDOSCOPY;  Surgeon: Daneil Dolin, MD;  Location: [facility];  Service: Endoscopy;  Laterality: N/A;    Her Family History Is Significant For: Family History  Problem Relation Age of Onset  . Ovarian cancer Mother   . Heart disease Mother   . Anxiety disorder Mother   . Cirrhosis Father     deceased age 30  . Alcohol abuse Father   . Colon cancer Other     aunt, deceased age 4  . Breast cancer Other     aunt, deceased age 1  . Liver cancer Cousin     age 34, deceased  . Drug abuse Cousin   . ADD / ADHD Son   . Anxiety disorder Sister   . Dementia Maternal Grandfather   . Bipolar disorder Neg Hx   . Depression Neg Hx   . OCD Neg Hx   . Paranoid behavior Neg Hx   . Schizophrenia Neg Hx   . Seizures Neg Hx   . Sexual abuse Neg Hx   . Physical abuse Neg Hx     Her Social History Is Significant For: History   Social History  . Marital Status: Married    Spouse Name: N/A    Number of Children: 2  . Years of Education: college   Occupational History  . student at [employer]   .     Social History Main Topics  . Smoking status: Never Smoker   . Smokeless tobacco: Never Used  . Alcohol Use: No     Comment: occasionally  . Drug Use: No  . Sexual Activity: Not Currently   Other Topics Concern  . None   Social History Narrative  . None    Her Allergies Are:  Allergies  Allergen Reactions  . Bactrim Itching, Nausea And Vomiting and Other (See Comments)    Redness  . Neurontin [Gabapentin] Palpitations and Other (See Comments)    confused  .  Codeine Nausea And Vomiting and Rash  :   Her Current Medications Are:  Outpatient Encounter Prescriptions as of 01/25/2014  Medication Sig  . ARIPiprazole (ABILIFY) 10 MG tablet Take one at bedtime  . Calcium Carb-Cholecalciferol 500-600 MG-UNIT CHEW Chew 1 tablet by mouth 3 (three) times daily.  Marland Kitchen conjugated estrogens (PREMARIN) vaginal cream Place vaginally daily.  . cycloSPORINE (RESTASIS) 0.05 % ophthalmic emulsion Place 1 drop  into both eyes 2 (two) times daily.  . cyproheptadine (PERIACTIN) 4 MG tablet Take 1 tablet (4 mg total) by mouth as needed (for the spice of life).  . diclofenac (VOLTAREN) 75 MG EC tablet Take 75 mg by mouth 2 (two) times daily.  Marland Kitchen escitalopram (LEXAPRO) 20 MG tablet Take 1 tablet (20 mg total) by mouth daily.  Marland Kitchen exenatide (BYETTA) 10 MCG/0.04ML SOLN Inject 10 mcg into the skin 2 (two) times daily with a meal.  . fluconazole (DIFLUCAN) 100 MG tablet Take 1 tablet (100 mg total) by mouth daily.  . fluticasone (FLONASE) 50 MCG/ACT nasal spray Place 2 sprays into the nose daily as needed. Allergies  . HYDROcodone-acetaminophen (NORCO/VICODIN) 5-325 MG per tablet Take 1 tablet by mouth every 6 (six) hours as needed for moderate pain.  Marland Kitchen lisinopril (PRINIVIL,ZESTRIL) 5 MG tablet Take 5 mg by mouth daily.  Marland Kitchen loratadine (CLARITIN) 10 MG tablet Take 10 mg by mouth daily.  Marland Kitchen lubiprostone (AMITIZA) 24 MCG capsule Take 24 mcg by mouth 2 (two) times daily with a meal.  . metFORMIN (GLUCOPHAGE) 500 MG tablet Take 500 mg by mouth 2 (two) times daily.  . Multiple Vitamin (MULTIVITAMIN PO) Take 1 tablet by mouth daily.   Marland Kitchen omeprazole (PRILOSEC) 20 MG capsule Take 20 mg by mouth daily as needed (acid reflux).   . pantoprazole (PROTONIX) 40 MG tablet Take 40 mg by mouth 2 (two) times daily.  . polyethylene glycol (MIRALAX / GLYCOLAX) packet Take 17 g by mouth daily.  Marland Kitchen Propylene Glycol-Glycerin (SOOTHE) 0.6-0.6 % SOLN Place 1 drop into both eyes 2 (two) times daily.  .  rizatriptan (MAXALT-MLT) 10 MG disintegrating tablet Take 1 tablet (10 mg total) by mouth as needed for migraine. May repeat in 2 hours if needed  . topiramate (TOPAMAX) 50 MG tablet Take 1 tablet (50 mg total) by mouth 2 (two) times daily.  . traMADol (ULTRAM) 50 MG tablet Take 50 mg by mouth 2 (two) times daily.   :  Review of Systems:  Out of a complete 14 point review of systems, all are reviewed and negative with the exception of these symptoms as listed below: Review of Systems  Constitutional: Positive for activity change, fatigue and unexpected weight change.  HENT: Positive for drooling.   Eyes: Positive for pain.       Blurred vision  Cardiovascular: Positive for chest pain.  Gastrointestinal: Positive for constipation.  Musculoskeletal: Positive for back pain, gait problem and joint swelling.       Joint swelling,aching muscles  Neurological: Positive for dizziness, speech difficulty and headaches.       Memory loss, restless leg, insomnia, apnea  Psychiatric/Behavioral: Positive for confusion. The patient is nervous/anxious.        Behavior problem, depression,     Objective:  Neurologic Exam  Physical Exam Physical Examination:   Filed Vitals:   01/25/14 1159  BP: 137/85  Pulse: 60  Temp: 97.8 F (36.6 C)    General Examination: The patient is a very pleasant 53 y.o. female in no acute distress. She appears well-developed and well-nourished and well groomed. She has gained weight.   HEENT: Normocephalic, atraumatic, pupils are equal, round and reactive to light and accommodation. Funduscopic exam is normal with sharp disc margins noted. Extraocular tracking is good without limitation to gaze excursion or nystagmus noted. Normal smooth pursuit is noted. Hearing is grossly intact. Face is symmetric with normal facial animation and normal facial sensation. Speech is clear with no  dysarthria noted. There is no hypophonia. There is no lip, neck/head, jaw or voice tremor.  Neck is supple with full range of passive and active motion. There are no carotid bruits on auscultation. Oropharynx exam reveals: mild mouth dryness, adequate dental hygiene and moderate airway crowding, due to tonsils in place and narrow airway entry. Mallampati is class II. Tongue protrudes centrally and palate elevates symmetrically. Tonsils are 1+ in size.   Chest: Clear to auscultation without wheezing, rhonchi or crackles noted.  Heart: S1+S2+0, regular and normal without murmurs, rubs or gallops noted.   Abdomen: Soft, non-tender and non-distended with normal bowel sounds appreciated on auscultation.  Extremities: There is trace pitting edema in the distal lower extremities bilaterally. Pedal pulses are intact.  Skin: Warm and dry without trophic changes noted. There are no varicose veins.  Musculoskeletal: exam reveals no obvious joint deformities, tenderness or joint swelling or erythema.   Neurologically:  Mental status: The patient is awake, alert and oriented in all 4 spheres. Her immediate and remote memory, attention, language skills and fund of knowledge are appropriate. There is no evidence of aphasia, agnosia, apraxia or anomia. Speech is clear with normal prosody and enunciation. Thought process is linear. Mood is normal and affect is normal.  Cranial nerves II - XII are as described above under HEENT exam. In addition: shoulder shrug is normal with equal shoulder height noted. Motor exam: Normal bulk, strength and tone is noted. There is no drift, tremor or rebound. Romberg is negative. Reflexes are 1+ in the UEs and trace in the LEs. Babinski: Toes are flexor bilaterally. Fine motor skills and coordination: intact with normal finger taps, normal hand movements, normal rapid alternating patting, normal foot taps and normal foot agility.  Cerebellar testing: No dysmetria or intention tremor on finger to nose testing. There is no truncal or gait ataxia.  Sensory exam: intact to  light touch, pinprick, vibration, temperature sense in the upper and lower extremities.  Gait, station and balance: She stands with difficulty due to body habitus. No veering to one side is noted. No leaning to one side is noted. Posture is age-appropriate and stance is narrow based. Gait shows normal stride length and normal pace. No problems turning are noted. She turns in 3 steps.   Assessment and Plan:   In summary, SHANDEE JERGENS is a very pleasant 53 year old female with a complex medical history of bipolar disorder, migraine headaches, obesity, s/p lap band surgery with regain of weight, memory problems, colonic polyps, diabetes, anxiety, reflux disease, diverticulitis, s/p partial colectomy in 2004 with chronic constipation and knee osteoarthritis, who presents for follow up of her OSA, on CPAP therapy on a pressure of 10 cm with excellent compliance and ongoing good tolerance of the pressure of the mask and improvement of her sleep. She has had improvement in her daytime somnolence, her nocturia, and sleep consolidation. She now recalls having dreams. She is fully compliant with treatment and I again went over her to sleep study results with her in detail as well as the compliance data. I encouraged her to continue using CPAP regularly as she may also notice improvement in the next few months with regards to her mood disorder, her memory, and her pain level. I congratulated her on her great compliance and encouraged her to continue with treatment. I would like to see her back in a year.  We also talked about trying to maintaining a healthy lifestyle in general. I encouraged the patient to eat  healthy, exercise daily and keep well hydrated, to keep a scheduled bedtime and wake time routine, to not skip any meals and eat healthy snacks in between meals and to have protein with every meal. I stressed the importance of regular exercise.   I answered all her questions today and the patient was in  agreement with the above outlined plan.

## 2014-02-01 ENCOUNTER — Ambulatory Visit (HOSPITAL_COMMUNITY): Payer: Self-pay | Admitting: Psychiatry

## 2014-02-20 ENCOUNTER — Ambulatory Visit (HOSPITAL_COMMUNITY): Payer: Self-pay | Admitting: Psychiatry

## 2014-02-22 ENCOUNTER — Ambulatory Visit (INDEPENDENT_AMBULATORY_CARE_PROVIDER_SITE_OTHER): Payer: Medicare Other | Admitting: Psychiatry

## 2014-02-22 DIAGNOSIS — F3162 Bipolar disorder, current episode mixed, moderate: Secondary | ICD-10-CM
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
## 2014-02-22 NOTE — Patient Instructions (Signed)
Discussed orally 

## 2014-03-11 ENCOUNTER — Encounter: Payer: Self-pay | Admitting: Neurology

## 2014-03-22 ENCOUNTER — Ambulatory Visit (INDEPENDENT_AMBULATORY_CARE_PROVIDER_SITE_OTHER): Payer: Medicare Other | Admitting: Psychiatry

## 2014-03-22 DIAGNOSIS — F319 Bipolar disorder, unspecified: Secondary | ICD-10-CM
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
## 2014-03-22 NOTE — Patient Instructions (Signed)
Discussed orally 

## 2014-03-25 ENCOUNTER — Ambulatory Visit (INDEPENDENT_AMBULATORY_CARE_PROVIDER_SITE_OTHER): Payer: Medicare Other | Admitting: Podiatry

## 2014-03-25 ENCOUNTER — Encounter: Payer: Self-pay | Admitting: Podiatry

## 2014-03-25 VITALS — BP 137/80 | HR 74 | Resp 16

## 2014-03-25 DIAGNOSIS — M722 Plantar fascial fibromatosis: Secondary | ICD-10-CM

## 2014-03-25 MED ORDER — TRIAMCINOLONE ACETONIDE 10 MG/ML IJ SUSP
10.0000 mg | Freq: Once | INTRAMUSCULAR | Status: AC
  Administered 2014-03-25: 10 mg

## 2014-03-26 NOTE — Progress Notes (Signed)
Subjective:     Patient ID: [name], female   DOB: 1960-09-04, 53 y.o.   MRN: [id number]  HPIpatient states I developed more pain in the plantar of my right heel and I have gained a lot of weight recently   Review of Systems     Objective:   Physical Exam Neurovascular status intact with intense discomfort plantar aspect right heel at the insertion of the tendon into the calcaneus    Assessment:     Plantar fasciitis right with inflammation    Plan:     Injected the right plantar fascia 3 mg Kenalog 5 mg Xylocaine and instructed on physical therapy and supportive shoe gear usage

## 2014-04-19 ENCOUNTER — Ambulatory Visit: Payer: Medicare Other | Admitting: Adult Health

## 2014-04-24 ENCOUNTER — Ambulatory Visit (INDEPENDENT_AMBULATORY_CARE_PROVIDER_SITE_OTHER): Payer: Medicare Other | Admitting: Gastroenterology

## 2014-04-24 ENCOUNTER — Encounter: Payer: Self-pay | Admitting: Gastroenterology

## 2014-04-24 VITALS — BP 149/87 | HR 72 | Temp 97.9°F | Ht 66.0 in | Wt 302.4 lb

## 2014-04-24 DIAGNOSIS — K5909 Other constipation: Secondary | ICD-10-CM

## 2014-04-24 DIAGNOSIS — R1031 Right lower quadrant pain: Secondary | ICD-10-CM

## 2014-04-24 MED ORDER — CIPROFLOXACIN HCL 500 MG PO TABS: 500.0000 mg | ORAL_TABLET | Freq: Two times a day (BID) | ORAL | Status: DC

## 2014-04-24 MED ORDER — METRONIDAZOLE 500 MG PO TABS: 500.0000 mg | ORAL_TABLET | Freq: Three times a day (TID) | ORAL | Status: DC

## 2014-04-24 MED ORDER — LINACLOTIDE 290 MCG PO CAPS: 290.0000 ug | ORAL_CAPSULE | Freq: Every day | ORAL | Status: DC

## 2014-04-24 NOTE — Patient Instructions (Signed)
1. Take cipro and flagyl for 10 days. Call with progress report to let us know if your pain resolved. 2. Try Linzess 239mcg once daily on empty stomach for constipation. Stop Amitiza. If more effective, call for prescription.

## 2014-04-24 NOTE — Assessment & Plan Note (Signed)
Four week h/o vague suprapubic/rlq tenderness without change in bowel habits. Baseline constipation with pelvic floor dyssynergy required biofeedback therapy in the past. History of complicated diverticulitis requiring partial colectomy in the past. Patient has a history of scattered pancolonic diverticula. Discussed at length with patient. Could consider CT scan but given her pain is mild on exam she is not interested at this time. We have decided to treat empirically for possible diverticulitis with the caveat that if she does not have complete resolution of her symptoms after completion of antibiotic therapy or if she has worsening of symptoms in the interim we would proceed with labs and CT scan. Cipro and Flagyl provided. She will call with a progress report in a couple of weeks. In the interim we'll also change her Amitiza to Linzess to see if we can improve her bowel function. Samples provided. If she prefers Linzess and we can send in a prescription to her mail order pharmacy. Further recommendations to follow.

## 2014-04-24 NOTE — Progress Notes (Signed)
Primary Care Physician:  Purvis Kilts, MD  Primary Gastroenterologist:  Garfield Cornea, MD   Chief Complaint  Patient presents with  . Follow-up    HPI:  [name] is a 53 y.o. female here for follow up and evaluation of RLQ pain, constipation.  Patient last seen at time of attempted colonoscopy in 2013. Prep was inadequate due to patient eating solid food the evening before her procedure as well as not completing the bowel prep. She was referred back to [facility] at that time for biofeedback for history of pelvic floor dyssynergy and to have a complete colonoscopy. She completed colonoscopy in 04/2012, took "two bottles of Miralax". She had benign polyps. They advised TCS in 10 years although patient has h/o adenomatous polyps so she should have one in 04/2017. She completed biofeedback therapy at [facility]. She found it very effective. Currently takes Amitiza 65mcg BID, Miralax daily.  BM two times per week.     Alternate protonix/prilosec but not take at the same time. Whichever she has available. Gained all wait back since lap band. GERD symptoms controlled if eats slowly. Started with RLQ pain about 4 weeks ago, like a toothache. Unaffected by BMs or meals. Pain is worse the more active she is. No fever, vomiting, appetite change. No work up so far.   Current Outpatient Prescriptions  Medication Sig Dispense Refill  . ALPRAZolam (XANAX) 1 MG tablet Take 1 mg by mouth 2 (two) times daily.    . ARIPiprazole (ABILIFY) 10 MG tablet Take one at bedtime 90 tablet 2  . Calcium Carb-Cholecalciferol 500-600 MG-UNIT CHEW Chew 1 tablet by mouth 3 (three) times daily.    . cycloSPORINE (RESTASIS) 0.05 % ophthalmic emulsion Place 1 drop into both eyes 2 (two) times daily.    Marland Kitchen escitalopram (LEXAPRO) 20 MG tablet Take 1 tablet (20 mg total) by mouth daily. 90 tablet 2  . exenatide (BYETTA) 10 MCG/0.04ML SOLN Inject 10 mcg into the skin 2 (two) times daily with a meal.    . fluticasone (FLONASE) 50  MCG/ACT nasal spray Place 2 sprays into the nose daily as needed. Allergies    . HYDROcodone-acetaminophen (NORCO/VICODIN) 5-325 MG per tablet Take 1 tablet by mouth every 6 (six) hours as needed for moderate pain.    Marland Kitchen lisinopril (PRINIVIL,ZESTRIL) 5 MG tablet Take 5 mg by mouth daily.    Marland Kitchen loratadine (CLARITIN) 10 MG tablet Take 10 mg by mouth daily.    Marland Kitchen lubiprostone (AMITIZA) 24 MCG capsule Take 24 mcg by mouth 2 (two) times daily with a meal.    . metFORMIN (GLUCOPHAGE) 500 MG tablet Take 500 mg by mouth 2 (two) times daily.    . Multiple Vitamin (MULTIVITAMIN PO) Take 1 tablet by mouth daily.     Marland Kitchen omeprazole (PRILOSEC) 20 MG capsule Take 20 mg by mouth daily as needed (acid reflux).     . pantoprazole (PROTONIX) 40 MG tablet Take 40 mg by mouth 2 (two) times daily.    . polyethylene glycol (MIRALAX / GLYCOLAX) packet Take 17 g by mouth daily.    Marland Kitchen Propylene Glycol-Glycerin (SOOTHE) 0.6-0.6 % SOLN Place 1 drop into both eyes 2 (two) times daily.    . rizatriptan (MAXALT-MLT) 10 MG disintegrating tablet Take 1 tablet (10 mg total) by mouth as needed for migraine. May repeat in 2 hours if needed 30 tablet 0  . topiramate (TOPAMAX) 50 MG tablet Take 1 tablet (50 mg total) by mouth 2 (two) times daily. [place]  tablet 3  . traMADol (ULTRAM) 50 MG tablet Take 50 mg by mouth 2 (two) times daily.      No current facility-administered medications for this visit.    Allergies as of 04/24/2014 - Review Complete 04/24/2014  Allergen Reaction Noted  . Bactrim Itching, Nausea And Vomiting, and Other (See Comments) 01/09/2011  . Neurontin [gabapentin] Palpitations and Other (See Comments) 07/21/2012  . Codeine Nausea And Vomiting and Rash     Past Medical History  Diagnosis Date  . Diabetes mellitus   . Migraine   . Arthritis   . Constipation   . Generalized headaches   . Diverticula of colon 2009  . Adenomatous polyp 2009  . Anxiety   . Depression   . Pelvic floor dysfunction     abnormal  anorectal manometry at [facility] in 2009  . Diabetes mellitus, type II   . Psoriasis   . Bipolar 1 disorder   . Obesity   . Plantar fasciitis     Past Surgical History  Procedure Laterality Date  . Colon surgery      complicated diverticulitis requiring sigmoid resection with colostomy and subsequent takedown  . Laparoscopic gastric banding  2009  . Heel spur surgery  09/11/13  . Abdominal hysterectomy    . Knee surgery      3 arthroscopic   . Colonoscopy  12/11/2007    Dr. Gala Romney- marginal prep, normal rectum pancolonic diverticula, adenomatous polyp  . Tubal ligation    . Colonoscopy  08/28/2003       Wide open colonic anastomosis/Scattered diverticula noted throughout colon/ Small external hemorrhoids  . Colon resection    03/30/2002    with end-colostomy and Hartmann's pouch  . Laparoscopic salpingoopherectomy  03/30/2002  . Colostomy closure  07/10/2002  . Knee arthroscopy  02/04/2004     left knee/partial medial meniscectomy.  . Flexible sigmoidoscopy  01/20/2012    RMR: incomplete/attempted colonoscopy. Poor prep precluded examination  . Colonoscopy  04/2012    [facility]: benign polyps    Family History  Problem Relation Age of Onset  . Ovarian cancer Mother   . Heart disease Mother   . Anxiety disorder Mother   . Cirrhosis Father     deceased age 81  . Alcohol abuse Father   . Colon cancer Other     aunt, deceased age 40  . Breast cancer Other     aunt, deceased age 32  . Liver cancer Cousin     age 49, deceased  . Drug abuse Cousin   . ADD / ADHD Son   . Anxiety disorder Sister   . Dementia Maternal Grandfather   . Bipolar disorder Neg Hx   . Depression Neg Hx   . OCD Neg Hx   . Paranoid behavior Neg Hx   . Schizophrenia Neg Hx   . Seizures Neg Hx   . Sexual abuse Neg Hx   . Physical abuse Neg Hx     History   Social History  . Marital Status: Married    Spouse Name: N/A    Number of Children: 2  . Years of Education: college   Occupational History  .  student at [employer]   .     Social History Main Topics  . Smoking status: Never Smoker   . Smokeless tobacco: Never Used  . Alcohol Use: No     Comment: occasionally  . Drug Use: No  . Sexual Activity: Not Currently   Other Topics Concern  .  Not on file   Social History Narrative      ROS:  General: Negative for anorexia, weight loss, fever, chills, fatigue, weakness. Eyes: Negative for vision changes.  ENT: Negative for hoarseness, difficulty swallowing , nasal congestion. CV: Negative for chest pain, angina, palpitations, dyspnea on exertion, peripheral edema.  Respiratory: Negative for dyspnea at rest, dyspnea on exertion, cough, sputum, wheezing.  GI: See history of present illness. GU:  Negative for dysuria, hematuria, urinary incontinence, urinary frequency, nocturnal urination.  MS: Negative for joint pain, low back pain.  Derm: Negative for rash or itching.  Neuro: Negative for weakness, abnormal sensation, seizure, frequent headaches, memory loss, confusion.  Psych: Negative for anxiety, depression, suicidal ideation, hallucinations.  Endo: Negative for unusual weight change.  Heme: Negative for bruising or bleeding. Allergy: Negative for rash or hives.    Physical Examination:  BP 149/87 mmHg  Pulse 72  Temp(Src) 97.9 F (36.6 C) (Oral)  Ht 5\' 6"  (1.676 m)  Wt 302 lb 6.4 oz (137.168 kg)  BMI 48.83 kg/m2   General: Well-nourished, well-developed in no acute distress.  Head: Normocephalic, atraumatic.   Eyes: Conjunctiva pink, no icterus. Mouth: Oropharyngeal mucosa moist and pink , no lesions erythema or exudate. Neck: Supple without thyromegaly, masses, or lymphadenopathy.  Lungs: Clear to auscultation bilaterally.  Heart: Regular rate and rhythm, no murmurs rubs or gallops.  Abdomen: Bowel sounds are normal, mild suprapubic/rlq tenderness, nondistended, no hepatosplenomegaly or masses, no abdominal bruits or    hernia , no rebound or guarding.   Rectal:  not performed Extremities: No lower extremity edema. No clubbing or deformities.  Neuro: Alert and oriented x 4 , grossly normal neurologically.  Skin: Warm and dry, no rash or jaundice.   Psych: Alert and cooperative, normal mood and affect.

## 2014-04-24 NOTE — Progress Notes (Signed)
cc'ed to pcp °

## 2014-04-25 ENCOUNTER — Ambulatory Visit (INDEPENDENT_AMBULATORY_CARE_PROVIDER_SITE_OTHER): Payer: Medicare Other | Admitting: Psychiatry

## 2014-04-25 DIAGNOSIS — F319 Bipolar disorder, unspecified: Secondary | ICD-10-CM
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
## 2014-04-25 NOTE — Patient Instructions (Signed)
Discussed orally 

## 2014-04-29 ENCOUNTER — Ambulatory Visit (INDEPENDENT_AMBULATORY_CARE_PROVIDER_SITE_OTHER): Payer: Medicare Other | Admitting: Podiatry

## 2014-04-29 ENCOUNTER — Ambulatory Visit (INDEPENDENT_AMBULATORY_CARE_PROVIDER_SITE_OTHER): Payer: Medicare Other

## 2014-04-29 DIAGNOSIS — M722 Plantar fascial fibromatosis: Secondary | ICD-10-CM | POA: Diagnosis not present

## 2014-05-01 NOTE — Progress Notes (Signed)
Subjective:     Patient ID: [name], female   DOB: Jan 06, 1961, 53 y.o.   MRN: [id number]  HPI patient states my right heel is killing me and something we'll need to be done with it   Review of Systems     Objective:   Physical Exam Neurovascular status intact negative Homans sign noted with continued discomfort on the medial central and lateral side of the plantar fascia right with failure to respond to different conservative options    Assessment:     Chronic plantar fasciitis right    Plan:     Reviewed condition at great length and I have recommended shockwave therapy to try to solve this problem. We do not want open surgery due to obesity and other issues and patient at this time is scheduled for shockwave therapy next week

## 2014-05-06 ENCOUNTER — Telehealth: Payer: Self-pay | Admitting: Internal Medicine

## 2014-05-06 MED ORDER — LINACLOTIDE 290 MCG PO CAPS: 290.0000 ug | ORAL_CAPSULE | Freq: Every day | ORAL | Status: DC

## 2014-05-06 NOTE — Addendum Note (Signed)
Addended by: Orvil Feil on: 05/06/2014 11:01 AM   Modules accepted: Orders

## 2014-05-06 NOTE — Telephone Encounter (Signed)
Patient called she is felling much better after antibiotic.  Was given a sample of linzess and it is working   [facility] she [facility] day supply    [phone]

## 2014-05-06 NOTE — Telephone Encounter (Signed)
Completed.

## 2014-05-06 NOTE — Telephone Encounter (Signed)
Routing to refill box  

## 2014-05-08 ENCOUNTER — Ambulatory Visit (INDEPENDENT_AMBULATORY_CARE_PROVIDER_SITE_OTHER): Payer: Medicare Other | Admitting: Podiatry

## 2014-05-08 DIAGNOSIS — M722 Plantar fascial fibromatosis: Secondary | ICD-10-CM

## 2014-05-08 NOTE — Progress Notes (Signed)
Subjective:     Patient ID: [name], female   DOB: 1960-07-13, 53 y.o.   MRN: [id number]  HPI patient presents for shockwave therapy   Review of Systems     Objective:   Physical Exam Pain medial central and lateral band plantar fascia    Assessment:     Chronic plantar fasciitis    Plan:     Shockwave 4.6 energy 16 frequency 2500 shocks

## 2014-05-09 ENCOUNTER — Telehealth (INDEPENDENT_AMBULATORY_CARE_PROVIDER_SITE_OTHER): Payer: Self-pay

## 2014-05-09 NOTE — Telephone Encounter (Signed)
Patient called about lap band port. Stated the port had moved and has been experiencing mild pain. She is not in any pain right now but when she does experience pain, it is at a 2 or 3. No n/v, fever, tenderness, redness, or difficulty eating. I informed [name] of the situation and he said this is normal. The port will move with the changes in body weight and tissue. The mild pain/discomfort is also to be expected. Patient was informed to call if she experiences any fever, n/v, intense pain, redness at the site, or difficulty eating. Patient verbalized understanding.

## 2014-05-15 ENCOUNTER — Ambulatory Visit (INDEPENDENT_AMBULATORY_CARE_PROVIDER_SITE_OTHER): Payer: Medicare Other | Admitting: Podiatry

## 2014-05-15 DIAGNOSIS — M722 Plantar fascial fibromatosis: Secondary | ICD-10-CM

## 2014-05-15 NOTE — Progress Notes (Signed)
epat #2  Right foot..  Bar 5.0   Freq 16.0   Pulses  2500

## 2014-05-15 NOTE — Progress Notes (Signed)
Subjective:     Patient ID: [name], female   DOB: 29-Jul-1960, 54 y.o.   MRN: [id number]  HPI patient presents for shockwave #2 stating it has improved   Review of Systems     Objective:   Physical Exam Neurovascular status intact muscle strength adequate with continued discomfort plantar aspect right heel that has improved some over where it was but no significant change    Assessment:     Plantar fasciitis still noted right heel    Plan:     Shockwave administered 2500 shocks 16 frequency 5.0 intensity to the plantar and central aspect of the tendon

## 2014-05-23 ENCOUNTER — Ambulatory Visit (INDEPENDENT_AMBULATORY_CARE_PROVIDER_SITE_OTHER): Payer: Medicare Other | Admitting: Podiatry

## 2014-05-23 ENCOUNTER — Encounter: Payer: Self-pay | Admitting: Podiatry

## 2014-05-23 DIAGNOSIS — M722 Plantar fascial fibromatosis: Secondary | ICD-10-CM

## 2014-05-23 NOTE — Progress Notes (Signed)
Subjective:     Patient ID: [name], female   DOB: [birth year], 54 y.o.   MRN: [id number]  HPI patient presents with improving pain right heel for shockwave therapy   Review of Systems     Objective:   Physical Exam Neurovascular status intact with diminished discomfort plantar medial aspect right heel    Assessment:     Improving plantar fasciitis    Plan:     At this time focused on the medial and medial central band of the plantar fascia at 5.0 intensity 2500 shocks 16 frequency. Reappoint in 4 weeks

## 2014-05-24 ENCOUNTER — Ambulatory Visit: Payer: Medicare Other | Admitting: Neurology

## 2014-05-28 ENCOUNTER — Telehealth: Payer: Self-pay | Admitting: Internal Medicine

## 2014-05-28 MED ORDER — LINACLOTIDE 290 MCG PO CAPS: 290.0000 ug | ORAL_CAPSULE | Freq: Every day | ORAL | Status: DC

## 2014-05-28 NOTE — Telephone Encounter (Signed)
PATIENT CALLED STATING THAT [facility] HAS NOT RECEIVED A PRESCRIPTION FOR LINZESS YET.  SHE ALSO STAYED THAT SHE CAN GET A 90 DAY SUPPLY FOR THE SAME PRICE AS A 30 DAY   PLEASE ADVISE.

## 2014-05-28 NOTE — Telephone Encounter (Signed)
Where does this need to be sent? I [facility] day [facility] I will send again, but the pharmacy may not be correct.

## 2014-05-28 NOTE — Telephone Encounter (Signed)
Routing to the refill box. 

## 2014-05-28 NOTE — Telephone Encounter (Signed)
I spoke with the pt- [facility]

## 2014-05-28 NOTE — Addendum Note (Signed)
Addended by: Orvil Feil on: 05/28/2014 11:13 AM   Modules accepted: Orders

## 2014-05-29 ENCOUNTER — Ambulatory Visit (INDEPENDENT_AMBULATORY_CARE_PROVIDER_SITE_OTHER): Payer: Medicare Other | Admitting: Psychiatry

## 2014-05-29 ENCOUNTER — Encounter (HOSPITAL_COMMUNITY): Payer: Self-pay | Admitting: Psychiatry

## 2014-05-29 VITALS — BP 155/97 | HR 81 | Ht 66.0 in | Wt 299.4 lb

## 2014-05-29 DIAGNOSIS — F319 Bipolar disorder, unspecified: Secondary | ICD-10-CM | POA: Diagnosis not present

## 2014-05-29 DIAGNOSIS — F3162 Bipolar disorder, current episode mixed, moderate: Secondary | ICD-10-CM

## 2014-05-29 DIAGNOSIS — F1021 Alcohol dependence, in remission: Secondary | ICD-10-CM

## 2014-05-29 DIAGNOSIS — F39 Unspecified mood [affective] disorder: Secondary | ICD-10-CM

## 2014-05-29 MED ORDER — ALPRAZOLAM 1 MG PO TABS: 1.0000 mg | ORAL_TABLET | Freq: Two times a day (BID) | ORAL | Status: DC | PRN

## 2014-05-29 MED ORDER — ARIPIPRAZOLE 10 MG PO TABS: ORAL_TABLET | ORAL | Status: DC

## 2014-05-29 MED ORDER — ESCITALOPRAM OXALATE 20 MG PO TABS: 20.0000 mg | ORAL_TABLET | Freq: Every day | ORAL | Status: DC
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
## 2014-05-30 NOTE — Telephone Encounter (Signed)
Open in error

## 2014-06-06 ENCOUNTER — Ambulatory Visit (HOSPITAL_COMMUNITY): Payer: Self-pay | Admitting: Psychiatry

## 2014-06-26 ENCOUNTER — Ambulatory Visit (INDEPENDENT_AMBULATORY_CARE_PROVIDER_SITE_OTHER): Payer: Medicare Other | Admitting: Podiatry

## 2014-06-26 ENCOUNTER — Encounter: Payer: Self-pay | Admitting: Podiatry

## 2014-06-26 DIAGNOSIS — M722 Plantar fascial fibromatosis: Secondary | ICD-10-CM

## 2014-06-26 NOTE — Progress Notes (Signed)
Subjective:     Patient ID: [name], female   DOB: 1960/07/09, 54 y.o.   MRN: [id number]  HPI patient states her heels been doing pretty well but she's having a lot of hip and knee pain and is trying to swim and lose weight   Review of Systems     Objective:   Physical Exam Neurovascular status intact significant discomfort and plantar pain right heel at the insertion    Assessment:     Improved plantar fasciitis right with shockwave    Plan:     Advised on continued shockwave and it was administered today at 5.0 2500 shocks 16 frequency

## 2014-07-26 ENCOUNTER — Ambulatory Visit (INDEPENDENT_AMBULATORY_CARE_PROVIDER_SITE_OTHER): Payer: Medicare Other | Admitting: Psychiatry

## 2014-07-26 DIAGNOSIS — F319 Bipolar disorder, unspecified: Secondary | ICD-10-CM | POA: Diagnosis not present

## 2014-07-26 NOTE — Progress Notes (Signed)
     THERAPIST PROGRESS NOTE  Session Time: Friday 07/26/2014 3:10 PM - 3:55 PM  Participation Level: Active  Behavioral Response: Well GroomedAlert,Anxious  Type of Therapy: Individual Therapy  Treatment Goals addressed:  Improve ability to manage stress without becoming overwhelmed or having anger outbursts.       Improve assertiveness skills and ability to set and maintain boundaries   Interventions: Supportive  Summary: [name] is a 54 y.o. female who presents with a long-standing history of bipolar disorder and has been seen in this practice for several years. She continues to experience periods of high stress, mood swings, and explosive outbursts at times.   Patient last was seen in December 2015. She reports continued improvement in her relationship with her husband and states they are trying to cooperate more with each other. She reports no explosive episodes since last session. She is experiencing stress related to increased health issues including blurred vision which she attributes to complications from diabetes. She has not been able to read as she has in the past due to vision problems. She has seen an optometrist but is willing to consider seeing an ophthalmologist. She is experiencing increased difficulty walking due to chronic knee and back pain. She needs knee replacement surgery but doctors want to wait as they say she is not old enough for her patient's report. She uses a cane most of the time. She also expresses frustration as she is experiencing increased memory difficulty, repeats herself often, and forgets words or phrases she wants to use. Patient reports becoming so frustrated with her health that she says she sometimes wishes God would take her away. However, she denies any thoughts, plans, or intent to harm herself due to her spiritual beliefs.    r   Suicidal/Homicidal: No  Therapist Response: Therapist works with patient to process feelings, discuss  possible resources, explore activities within patient's capabilities that she can pursue,   Plan: Return again in 4 weeks.  Diagnosis: Axis I: Bipolar Disorder    Axis II: No diagnosis    Darrek Leasure, LCSW 07/26/2014

## 2014-07-26 NOTE — Patient Instructions (Signed)
Discussed orally 

## 2014-08-23 ENCOUNTER — Ambulatory Visit (HOSPITAL_COMMUNITY): Payer: Self-pay | Admitting: Psychiatry

## 2014-09-09 ENCOUNTER — Encounter (HOSPITAL_COMMUNITY): Payer: Self-pay | Admitting: Psychiatry

## 2014-09-09 ENCOUNTER — Ambulatory Visit (INDEPENDENT_AMBULATORY_CARE_PROVIDER_SITE_OTHER): Payer: Medicare Other | Admitting: Psychiatry

## 2014-09-09 VITALS — BP 106/70 | HR 96 | Ht 66.0 in | Wt 294.0 lb

## 2014-09-09 DIAGNOSIS — R454 Irritability and anger: Secondary | ICD-10-CM

## 2014-09-09 DIAGNOSIS — F3162 Bipolar disorder, current episode mixed, moderate: Secondary | ICD-10-CM

## 2014-09-09 DIAGNOSIS — F1021 Alcohol dependence, in remission: Secondary | ICD-10-CM | POA: Diagnosis not present

## 2014-09-09 DIAGNOSIS — F39 Unspecified mood [affective] disorder: Secondary | ICD-10-CM

## 2014-09-09 DIAGNOSIS — F319 Bipolar disorder, unspecified: Secondary | ICD-10-CM | POA: Diagnosis not present

## 2014-09-09 MED ORDER — ARIPIPRAZOLE 10 MG PO TABS: ORAL_TABLET | ORAL | Status: DC

## 2014-09-09 MED ORDER — ALPRAZOLAM 1 MG PO TABS: 1.0000 mg | ORAL_TABLET | Freq: Two times a day (BID) | ORAL | Status: DC | PRN

## 2014-09-09 MED ORDER — ESCITALOPRAM OXALATE 20 MG PO TABS: 20.0000 mg | ORAL_TABLET | Freq: Every day | ORAL | Status: DC
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
## 2014-09-17 ENCOUNTER — Ambulatory Visit (INDEPENDENT_AMBULATORY_CARE_PROVIDER_SITE_OTHER): Payer: Medicare Other | Admitting: Podiatry

## 2014-09-17 ENCOUNTER — Encounter: Payer: Self-pay | Admitting: Podiatry

## 2014-09-17 VITALS — BP 150/92 | HR 77 | Resp 18

## 2014-09-17 DIAGNOSIS — M722 Plantar fascial fibromatosis: Secondary | ICD-10-CM

## 2014-09-17 MED ORDER — TRIAMCINOLONE ACETONIDE 10 MG/ML IJ SUSP
10.0000 mg | Freq: Once | INTRAMUSCULAR | Status: AC
  Administered 2014-09-17: 10 mg

## 2014-09-18 NOTE — Progress Notes (Signed)
Subjective:     Patient ID: [name], female   DOB: August 22, 1960, 54 y.o.   MRN: [id number]  HPI patient presents stating I'm doing pretty well on the inside of my heel but the outside of my heel has been bothering   Review of Systems     Objective:   Physical Exam Neurovascular status intact muscle strength adequate with discomfort in the plantar lateral aspect of the right heel with inflammation and fluid buildup    Assessment:     Plantar fasciitis plantar lateral side heel right    Plan:     Lateral injection performed of the tendon 3 mg Kenalog 5 mg Xylocaine

## 2014-09-19 ENCOUNTER — Ambulatory Visit (HOSPITAL_COMMUNITY): Payer: Self-pay | Admitting: Psychiatry

## 2014-10-09 ENCOUNTER — Ambulatory Visit (INDEPENDENT_AMBULATORY_CARE_PROVIDER_SITE_OTHER): Payer: Medicare Other | Admitting: Psychiatry

## 2014-10-09 DIAGNOSIS — F319 Bipolar disorder, unspecified: Secondary | ICD-10-CM | POA: Diagnosis not present
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
## 2014-10-09 NOTE — Patient Instructions (Signed)
Discussed orally 

## 2014-11-05 ENCOUNTER — Telehealth (HOSPITAL_COMMUNITY): Payer: Self-pay | Admitting: *Deleted

## 2014-11-26 ENCOUNTER — Other Ambulatory Visit: Payer: Self-pay | Admitting: Neurology

## 2014-12-04 ENCOUNTER — Ambulatory Visit (INDEPENDENT_AMBULATORY_CARE_PROVIDER_SITE_OTHER): Payer: Medicare Other | Admitting: Psychiatry

## 2014-12-04 DIAGNOSIS — F319 Bipolar disorder, unspecified: Secondary | ICD-10-CM

## 2014-12-06 NOTE — Patient Instructions (Signed)
Discussed orally 
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
## 2014-12-10 ENCOUNTER — Ambulatory Visit (HOSPITAL_COMMUNITY): Payer: Self-pay | Admitting: Psychiatry

## 2014-12-13 ENCOUNTER — Encounter (HOSPITAL_COMMUNITY): Payer: Self-pay | Admitting: Psychiatry

## 2014-12-13 ENCOUNTER — Ambulatory Visit (INDEPENDENT_AMBULATORY_CARE_PROVIDER_SITE_OTHER): Payer: Medicare Other | Admitting: Psychiatry

## 2014-12-13 DIAGNOSIS — F39 Unspecified mood [affective] disorder: Secondary | ICD-10-CM | POA: Diagnosis not present

## 2014-12-13 DIAGNOSIS — F3162 Bipolar disorder, current episode mixed, moderate: Secondary | ICD-10-CM

## 2014-12-13 DIAGNOSIS — F319 Bipolar disorder, unspecified: Secondary | ICD-10-CM | POA: Diagnosis not present

## 2014-12-13 DIAGNOSIS — F1021 Alcohol dependence, in remission: Secondary | ICD-10-CM | POA: Diagnosis not present

## 2014-12-13 MED ORDER — TRAZODONE HCL 50 MG PO TABS: 50.0000 mg | ORAL_TABLET | Freq: Every day | ORAL | Status: DC

## 2014-12-13 MED ORDER — ARIPIPRAZOLE 10 MG PO TABS: ORAL_TABLET | ORAL | Status: DC

## 2014-12-13 MED ORDER — ESCITALOPRAM OXALATE 20 MG PO TABS: 20.0000 mg | ORAL_TABLET | Freq: Every day | ORAL | Status: DC

## 2014-12-13 MED ORDER — ALPRAZOLAM 1 MG PO TABS: 1.0000 mg | ORAL_TABLET | Freq: Two times a day (BID) | ORAL | Status: DC | PRN
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
## 2014-12-16 ENCOUNTER — Other Ambulatory Visit (HOSPITAL_COMMUNITY): Payer: Self-pay | Admitting: Physician Assistant

## 2014-12-16 DIAGNOSIS — Z1231 Encounter for screening mammogram for malignant neoplasm of breast: Secondary | ICD-10-CM

## 2014-12-31 ENCOUNTER — Ambulatory Visit (INDEPENDENT_AMBULATORY_CARE_PROVIDER_SITE_OTHER): Payer: Medicare Other | Admitting: Gastroenterology

## 2014-12-31 ENCOUNTER — Encounter: Payer: Self-pay | Admitting: Gastroenterology

## 2014-12-31 VITALS — BP 157/94 | HR 78 | Temp 98.1°F | Ht 66.0 in | Wt 295.0 lb

## 2014-12-31 DIAGNOSIS — R1032 Left lower quadrant pain: Secondary | ICD-10-CM | POA: Insufficient documentation

## 2014-12-31 DIAGNOSIS — K219 Gastro-esophageal reflux disease without esophagitis: Secondary | ICD-10-CM

## 2014-12-31 DIAGNOSIS — R1030 Lower abdominal pain, unspecified: Secondary | ICD-10-CM | POA: Insufficient documentation

## 2014-12-31 DIAGNOSIS — K5900 Constipation, unspecified: Secondary | ICD-10-CM | POA: Diagnosis not present

## 2014-12-31 MED ORDER — METRONIDAZOLE 500 MG PO TABS: 500.0000 mg | ORAL_TABLET | Freq: Three times a day (TID) | ORAL | Status: DC

## 2014-12-31 MED ORDER — LINACLOTIDE 290 MCG PO CAPS: 290.0000 ug | ORAL_CAPSULE | Freq: Every day | ORAL | Status: DC

## 2014-12-31 MED ORDER — PANTOPRAZOLE SODIUM 40 MG PO TBEC: 40.0000 mg | DELAYED_RELEASE_TABLET | Freq: Two times a day (BID) | ORAL | Status: DC

## 2014-12-31 MED ORDER — CIPROFLOXACIN HCL 500 MG PO TABS: 500.0000 mg | ORAL_TABLET | Freq: Two times a day (BID) | ORAL | Status: DC

## 2014-12-31 NOTE — Progress Notes (Signed)
Primary Care Physician: Purvis Kilts, MD  Primary Gastroenterologist:  Garfield Cornea, MD   Chief Complaint  Patient presents with  . Abdominal Pain    HPI: [name] is a 54 y.o. female here for further evaluation of abdominal pain. Last seen 04/2014. Treated empirically for diverticulitis at that time.   Three months of LLQ pain and into left flank. First time seen by PCP and given antibiotic (?Z-pak) for ?diverticulitis. Did seem to help. Has also been given muscle relaxant. Pain worse with putting pressure on the area. Worse with eating. Worse at times. Sometimes less severe. No fever. BM regular on Linzess. No melena, brbpr. Has to be careful with diet. Eats very small portions. Lap band filled to maximum level. Feels hungry all the time. Heartburn well controlled. Cannot exercise due to stress fracture in her foot.    Oxycodone BID for foot.   Patient reminds me that her last colonoscopy was 04/2012 at [facility] after failed attempt at colonoscopy locally due to inadequate prep. She took only 1/2 the prep and ate solid food the night before her TCS. For her TCS at [facility], she mixed two bottles of Miralax in 64 ounces of Gatorade.   Current Outpatient Prescriptions  Medication Sig Dispense Refill  . ALPRAZolam (XANAX) 1 MG tablet Take 1 tablet (1 mg total) by mouth 2 (two) times daily as needed for anxiety. 180 tablet 2  . ARIPiprazole (ABILIFY) 10 MG tablet Take one at bedtime 90 tablet 2  . Calcium Carb-Cholecalciferol 500-600 MG-UNIT CHEW Chew 1 tablet by mouth 3 (three) times daily.    . cycloSPORINE (RESTASIS) 0.05 % ophthalmic emulsion Place 1 drop into both eyes 2 (two) times daily.    Marland Kitchen escitalopram (LEXAPRO) 20 MG tablet Take 1 tablet (20 mg total) by mouth daily. 90 tablet 2  . exenatide (BYETTA) 10 MCG/0.04ML SOLN Inject 10 mcg into the skin 2 (two) times daily with a meal.    . fluticasone (FLONASE) 50 MCG/ACT nasal spray Place 2 sprays into the nose daily as  needed. Allergies    . HYDROcodone-acetaminophen (NORCO/VICODIN) 5-325 MG per tablet Take 1 tablet by mouth every 6 (six) hours as needed for moderate pain.    . Linaclotide (LINZESS) 290 MCG CAPS capsule Take 1 capsule (290 mcg total) by mouth daily. 90 capsule 3  . lisinopril (PRINIVIL,ZESTRIL) 5 MG tablet Take 5 mg by mouth daily.    Marland Kitchen loratadine (CLARITIN) 10 MG tablet Take 10 mg by mouth daily.    . meclizine (ANTIVERT) 25 MG tablet Take 25 mg by mouth.  2  . metFORMIN (GLUCOPHAGE) 500 MG tablet Take 500 mg by mouth 2 (two) times daily.    . Multiple Vitamin (MULTIVITAMIN PO) Take 1 tablet by mouth daily.     Marland Kitchen oxyCODONE-acetaminophen (PERCOCET/ROXICET) 5-325 MG per tablet 1 tablet every 6 (six) hours as needed.    . pantoprazole (PROTONIX) 40 MG tablet Take 1 tablet (40 mg total) by mouth 2 (two) times daily. 180 tablet 3  . polyethylene glycol (MIRALAX / GLYCOLAX) packet Take 17 g by mouth daily.    Marland Kitchen Propylene Glycol-Glycerin (SOOTHE) 0.6-0.6 % SOLN Place 1 drop into both eyes 2 (two) times daily.    . rizatriptan (MAXALT-MLT) 10 MG disintegrating tablet TAKE 1 TABLET AS NEEDED FOR MIGRAINE, MAY REPEAT IN 2 HOURS IF NEEDED 12 tablet 0  . topiramate (TOPAMAX) 50 MG tablet Take 1 tablet (50 mg total) by mouth 2 (two)  times daily. 180 tablet 3  . traMADol (ULTRAM) 50 MG tablet Take 50 mg by mouth 2 (two) times daily.     . traZODone (DESYREL) 50 MG tablet Take 1 tablet (50 mg total) by mouth at bedtime. 90 tablet 2  .     0  .    0   No current facility-administered medications for this visit.    Allergies as of 12/31/2014 - Review Complete 12/13/2014  Allergen Reaction Noted  . Bactrim Itching, Nausea And Vomiting, and Other (See Comments) 01/09/2011  . Neurontin [gabapentin] Palpitations and Other (See Comments) 07/21/2012  . Codeine Nausea And Vomiting and Rash    Past Surgical History  Procedure Laterality Date  . Colon surgery      complicated diverticulitis requiring sigmoid  resection with colostomy and subsequent takedown  . Laparoscopic gastric banding  2009  . Heel spur surgery  09/11/13  . Abdominal hysterectomy    . Knee surgery      3 arthroscopic   . Colonoscopy  12/11/2007    Dr. Gala Romney- marginal prep, normal rectum pancolonic diverticula, adenomatous polyp  . Tubal ligation    . Colonoscopy  08/28/2003       Wide open colonic anastomosis/Scattered diverticula noted throughout colon/ Small external hemorrhoids  . Colon resection    03/30/2002    with end-colostomy and Hartmann's pouch  . Laparoscopic salpingoopherectomy  03/30/2002  . Colostomy closure  07/10/2002  . Knee arthroscopy  02/04/2004     left knee/partial medial meniscectomy.  . Flexible sigmoidoscopy  01/20/2012    RMR: incomplete/attempted colonoscopy. Inadequate prep precluded examination  . Colonoscopy  04/2012    [facility]: hyperplastic polyps, diverticulosis, ileocolonic anastomosis.    ROS:  General: Negative for anorexia, weight loss, fever, chills, fatigue, weakness. ENT: Negative for hoarseness, difficulty swallowing , nasal congestion. CV: Negative for chest pain, angina, palpitations, dyspnea on exertion, peripheral edema.  Respiratory: Negative for dyspnea at rest, dyspnea on exertion, cough, sputum, wheezing.  GI: See history of present illness. GU:  Negative for dysuria, hematuria, urinary incontinence, urinary frequency, nocturnal urination.  Endo: Negative for unusual weight change.    Physical Examination:   BP 157/94 mmHg  Pulse 78  Temp(Src) 98.1 F (36.7 C) (Oral)  Ht 5\' 6"  (1.676 m)  Wt 295 lb (133.811 kg)  BMI 47.64 kg/m2  General: Well-nourished, well-developed in no acute distress.  Eyes: No icterus. Mouth: Oropharyngeal mucosa moist and pink , no lesions erythema or exudate. Lungs: Clear to auscultation bilaterally.  Heart: Regular rate and rhythm, no murmurs rubs or gallops.  Abdomen: Bowel sounds are normal, LLQ tenderness/left mid-abd tenderness,,  nondistended, no hepatosplenomegaly or masses, no abdominal bruits or hernia , no rebound or guarding.   Extremities: No lower extremity edema. No clubbing or deformities. Neuro: Alert and oriented x 4   Skin: Warm and dry, no jaundice.   Psych: Alert and cooperative, normal mood and affect.  Labs:  None available  Imaging Studies: No results found.

## 2014-12-31 NOTE — Patient Instructions (Signed)
1. Cipro 500 mg twice daily for 14 days. 2. Flagyl 500 mg 3 times daily for 14 days. 3. [facility]oprazole [facility] 4. Call in 2 weeks with progress report. If not 100% improved, consider CT of the abdomen and pelvis at that time.

## 2015-01-01 ENCOUNTER — Ambulatory Visit (HOSPITAL_COMMUNITY): Payer: Self-pay | Admitting: Psychiatry

## 2015-01-03 ENCOUNTER — Ambulatory Visit (HOSPITAL_COMMUNITY): Payer: Self-pay | Admitting: Psychiatry

## 2015-01-03 ENCOUNTER — Encounter: Payer: Self-pay | Admitting: Gastroenterology

## 2015-01-03 NOTE — Assessment & Plan Note (Signed)
Doing well. Continue pantoprazole BID. Patient has failed once daily dosing.

## 2015-01-03 NOTE — Assessment & Plan Note (Signed)
Doing well. Continue Linzess 223mcg daily.

## 2015-01-03 NOTE — Assessment & Plan Note (Signed)
Treat empirically for diverticulitis. If no improvement, then may require CT A/P with contrast. Progress report in two weeks or sooner if needed.

## 2015-01-07 NOTE — Progress Notes (Signed)
cc'ed to pcp °

## 2015-01-20 ENCOUNTER — Ambulatory Visit (HOSPITAL_COMMUNITY)
Admission: RE | Admit: 2015-01-20 | Discharge: 2015-01-20 | Disposition: A | Discharge status: Home / Self Care | Payer: Medicare Other | Source: Ambulatory Visit | Attending: Physician Assistant | Admitting: Physician Assistant

## 2015-01-20 DIAGNOSIS — Z1231 Encounter for screening mammogram for malignant neoplasm of breast: Secondary | ICD-10-CM | POA: Diagnosis present

## 2015-01-30 ENCOUNTER — Ambulatory Visit (INDEPENDENT_AMBULATORY_CARE_PROVIDER_SITE_OTHER): Payer: Medicare Other | Admitting: Neurology

## 2015-01-30 ENCOUNTER — Encounter: Payer: Self-pay | Admitting: Neurology

## 2015-01-30 VITALS — BP 132/78 | HR 80 | Resp 18 | Ht 66.0 in | Wt 298.0 lb

## 2015-01-30 DIAGNOSIS — F39 Unspecified mood [affective] disorder: Secondary | ICD-10-CM

## 2015-01-30 DIAGNOSIS — G4733 Obstructive sleep apnea (adult) (pediatric): Secondary | ICD-10-CM

## 2015-01-30 DIAGNOSIS — Z9989 Dependence on other enabling machines and devices: Principal | ICD-10-CM

## 2015-01-30 NOTE — Progress Notes (Signed)
Subjective:    Patient ID: [name] is a 54 y.o. female.  HPI     Interim history:   [name] is a 54 year old right-handed woman with a complex medical history of bipolar disorder, migraine headaches, obesity, s/p lap band surgery, memory problems, colonic polyps, diabetes, anxiety, reflux disease, constipation and osteoarthritis, who presents for followup consultation of her obstructive sleep apnea. She is unaccompanied today. I last saw her on 01/25/2014, at which time she reported ongoing good results with CPAP. She was compliant with treatment. She had recent left plantar fasciitis surgery. She was complaining of bilateral knee pain.  Today, 01/30/2015: I reviewed her CPAP compliance data from 12/29/2014 through 01/27/2015 which is a total of 30 days during which time she used her machine every night with percent used days greater than 4 hours at 100%, indicating superb compliance with an average usage of 7 hours and 58 minutes, residual AHI low at 1 per hour, leak low for the 95th percentile at 5.7 L/m on a pressure of 10 cm with EPR of 3.   Today, 01/30/2015: She reports doing well with CPAP. She has bothersome pain on the right side with plantar fasciitis. She had a procedure done but still has pain. She is wearing a boot on it. She may need surgery. As far CPAP is concerned she has no complaints. She is compliant with treatment. She is getting regular supplies and changes her filters regularly. She is pleased with how she is doing sleep wise.  Previously:   I first met her at the request of Dr. Jannifer Franklin on 07/09/2013, at which time we talked about her sleep studies including her baseline and her CPAP titration study as well as her CPAP compliance data. She reported having been able to dream since being on CPAP therapy and she had less daytime somnolence. She had been able to sleep in her bed as opposed to the recliner and felt that her sleep quality was much better. She had  reduced nocturia and also reduced RLS symptoms.   I reviewed her compliance data from 10/22/2013 2 11/20/2013 which is a total of 30 days during which time she was CPAP every night with percent used days greater than 4 hours at 90%, average usage of 6 hours and 45 minutes, residual AHI low at 0.9 per hour and leak low at 3.2 L per minute at the 95th percentile, pressure at 10 cm with EPR.   I reviewed her compliance data from 12/24/2013 2 01/22/2014 which is a total of 30 days during which time she used her CPAP every night, percent used days greater than 4 hours was 97%, indicating excellent compliance, average usage of 6 hours and 50 minutes. Residual AHI low at 1.2 per hour, leak low at 4.5 L per minute at the 95th percentile. Pressure at 10 cm with EPR of 3.  She had a baseline sleep study on 04/09/2013 which demonstrated a total AHI of 14.5 per hour, supine AHI of 22.6 per hour, and no REM sleep related AHI of 47.4 per hour with an oxyhemoglobin desaturation nadir of 74%. She had a CPAP titration study on 05/21/2013, which a sleep efficiency of 82.8%. Arousal index was normal. She had an increased percentage of stage II sleep, absence of slow-wave sleep, and an increased percentage of REM sleep at 31.8% with a mildly prolonged REM latency of 134.5 minutes. She had mild periodic leg movements with very little arousals. She was titrated on CPAP using a nasal  pillows mask. On a pressure of 10 cm her residual AHI was 5.2 per hour. I prescribed CPAP therapy for her.   I reviewed her compliance data from 06/08/2013 through 07/08/2013 which is a total of 31 days during which time she used CPAP every night. Percent used days greater than 4 hours was 94%, indicating excellent compliance. Residual AHI was 1.5 per hour and leak was very low. Pressure is 10 cm with CPR of 3. Average usage for all nights for 6 hours and 35 minutes.    Her Past Medical History Is Significant For: Past Medical History  Diagnosis  Date  . Diabetes mellitus   . Migraine   . Arthritis   . Constipation   . Generalized headaches   . Diverticula of colon 2009  . Adenomatous polyp 2009  . Anxiety   . Depression   . Pelvic floor dysfunction     abnormal anorectal manometry at [facility] in 2009  . Diabetes mellitus, type II   . Psoriasis   . Bipolar 1 disorder   . Obesity   . Plantar fasciitis     Her Past Surgical History Is Significant For: Past Surgical History  Procedure Laterality Date  . Colon surgery      complicated diverticulitis requiring sigmoid resection with colostomy and subsequent takedown  . Laparoscopic gastric banding  2009  . Heel spur surgery  09/11/13  . Abdominal hysterectomy    . Knee surgery      3 arthroscopic   . Colonoscopy  12/11/2007    Dr. Gala Romney- marginal prep, normal rectum pancolonic diverticula, adenomatous polyp  . Tubal ligation    . Colonoscopy  08/28/2003       Wide open colonic anastomosis/Scattered diverticula noted throughout colon/ Small external hemorrhoids  . Colon resection    03/30/2002    with end-colostomy and Hartmann's pouch  . Laparoscopic salpingoopherectomy  03/30/2002  . Colostomy closure  07/10/2002  . Knee arthroscopy  02/04/2004     left knee/partial medial meniscectomy.  . Flexible sigmoidoscopy  01/20/2012    RMR: incomplete/attempted colonoscopy. Inadequate prep precluded examination  . Colonoscopy  04/2012    [facility]: hyperplastic polyps, diverticulosis, ileocolonic anastomosis.    Her Family History Is Significant For: Family History  Problem Relation Age of Onset  . Ovarian cancer Mother   . Heart disease Mother   . Anxiety disorder Mother   . Cirrhosis Father     deceased age 23  . Alcohol abuse Father   . Colon cancer Other     aunt, deceased age 22  . Breast cancer Other     aunt, deceased age 83  . Liver cancer Cousin     age 37, deceased  . Drug abuse Cousin   . ADD / ADHD Son   . Anxiety disorder Sister   . Dementia Maternal  Grandfather   . Bipolar disorder Neg Hx   . Depression Neg Hx   . OCD Neg Hx   . Paranoid behavior Neg Hx   . Schizophrenia Neg Hx   . Seizures Neg Hx   . Sexual abuse Neg Hx   . Physical abuse Neg Hx     Her Social History Is Significant For: Social History   Social History  . Marital Status: Married    Spouse Name: N/A  . Number of Children: 2  . Years of Education: college   Occupational History  . student at [employer]   .     Social History Main  Topics  . Smoking status: Never Smoker   . Smokeless tobacco: Never Used  . Alcohol Use: No     Comment: occasionally  . Drug Use: No  . Sexual Activity: Not Currently   Other Topics Concern  . None   Social History Narrative    Her Allergies Are:  Allergies  Allergen Reactions  . Bactrim Itching, Nausea And Vomiting and Other (See Comments)    Redness  . Neurontin [Gabapentin] Palpitations and Other (See Comments)    confused  . Codeine Nausea And Vomiting and Rash  :   Her Current Medications Are:  Outpatient Encounter Prescriptions as of 01/30/2015  Medication Sig  . ALPRAZolam (XANAX) 1 MG tablet Take 1 tablet (1 mg total) by mouth 2 (two) times daily as needed for anxiety.  . ARIPiprazole (ABILIFY) 10 MG tablet Take one at bedtime  . Calcium Carb-Cholecalciferol 500-600 MG-UNIT CHEW Chew 1 tablet by mouth 3 (three) times daily.  . ciprofloxacin (CIPRO) 500 MG tablet Take 1 tablet (500 mg total) by mouth 2 (two) times daily.  . cycloSPORINE (RESTASIS) 0.05 % ophthalmic emulsion Place 1 drop into both eyes 2 (two) times daily.  Marland Kitchen escitalopram (LEXAPRO) 20 MG tablet Take 1 tablet (20 mg total) by mouth daily.  Marland Kitchen exenatide (BYETTA) 10 MCG/0.04ML SOLN Inject 10 mcg into the skin 2 (two) times daily with a meal.  . fluticasone (FLONASE) 50 MCG/ACT nasal spray Place 2 sprays into the nose daily as needed. Allergies  . HYDROcodone-acetaminophen (NORCO/VICODIN) 5-325 MG per tablet Take 1 tablet by mouth every 6 (six)  hours as needed for moderate pain.  . Linaclotide (LINZESS) 290 MCG CAPS capsule Take 1 capsule (290 mcg total) by mouth daily.  Marland Kitchen lisinopril (PRINIVIL,ZESTRIL) 5 MG tablet Take 5 mg by mouth daily.  Marland Kitchen loratadine (CLARITIN) 10 MG tablet Take 10 mg by mouth daily.  . meclizine (ANTIVERT) 25 MG tablet Take 25 mg by mouth.  . metFORMIN (GLUCOPHAGE) 500 MG tablet Take 500 mg by mouth 2 (two) times daily.  . metroNIDAZOLE (FLAGYL) 500 MG tablet Take 1 tablet (500 mg total) by mouth 3 (three) times daily.  . Multiple Vitamin (MULTIVITAMIN PO) Take 1 tablet by mouth daily.   Marland Kitchen oxyCODONE-acetaminophen (PERCOCET/ROXICET) 5-325 MG per tablet 1 tablet every 6 (six) hours as needed.  . pantoprazole (PROTONIX) 40 MG tablet Take 1 tablet (40 mg total) by mouth 2 (two) times daily.  . polyethylene glycol (MIRALAX / GLYCOLAX) packet Take 17 g by mouth daily.  Marland Kitchen Propylene Glycol-Glycerin (SOOTHE) 0.6-0.6 % SOLN Place 1 drop into both eyes 2 (two) times daily.  . rizatriptan (MAXALT-MLT) 10 MG disintegrating tablet TAKE 1 TABLET AS NEEDED FOR MIGRAINE, MAY REPEAT IN 2 HOURS IF NEEDED  . topiramate (TOPAMAX) 50 MG tablet Take 1 tablet (50 mg total) by mouth 2 (two) times daily.  . traZODone (DESYREL) 50 MG tablet Take 1 tablet (50 mg total) by mouth at bedtime.  . [DISCONTINUED] traMADol (ULTRAM) 50 MG tablet Take 50 mg by mouth 2 (two) times daily.    No facility-administered encounter medications on file as of 01/30/2015.  :  Review of Systems:  Out of a complete 14 point review of systems, all are reviewed and negative with the exception of these symptoms as listed below:   Review of Systems  Neurological:       Patient states that she is doing well on CPAP, reports that she cannot sleep without it.     Objective:  Neurologic Exam  Physical Exam Physical Examination:   Filed Vitals:   01/30/15 1542  BP: 132/78  Pulse: 80  Resp: 18    General Examination: The patient is a very pleasant 54 y.o.  female in no acute distress. She appears well-developed and well-nourished and well groomed. She has gained weight.   HEENT: Normocephalic, atraumatic, pupils are equal, round and reactive to light and accommodation. Extraocular tracking is good without limitation to gaze excursion or nystagmus noted. Normal smooth pursuit is noted. Hearing is grossly intact. Face is symmetric with normal facial animation and normal facial sensation. Speech is clear with no dysarthria noted. There is no hypophonia. There is no lip, neck/head, jaw or voice tremor. Neck is supple with full range of passive and active motion. There are no carotid bruits on auscultation. Oropharynx exam reveals: mild mouth dryness, adequate dental hygiene and moderate airway crowding, due to tonsils in place and narrow airway entry. Mallampati is class II. Tongue protrudes centrally and palate elevates symmetrically. Tonsils are 1+ in size.   Chest: Clear to auscultation without wheezing, rhonchi or crackles noted.  Heart: S1+S2+0, regular and normal without murmurs, rubs or gallops noted.   Abdomen: Soft, non-tender and non-distended with normal bowel sounds appreciated on auscultation.  Extremities: There is trace pitting edema in the distal lower extremities bilaterally. She has an elastic sleeve over her right ankle. She took her boot off for the exam.   Skin: Warm and dry without trophic changes noted. There are no varicose veins.  Musculoskeletal: exam reveals no obvious joint deformities, tenderness or joint swelling or erythema.   Neurologically:  Mental status: The patient is awake, alert and oriented in all 4 spheres. Her immediate and remote memory, attention, language skills and fund of knowledge are appropriate. There is no evidence of aphasia, agnosia, apraxia or anomia. Speech is clear with normal prosody and enunciation. Thought process is linear. Mood is normal and affect is normal.  Cranial nerves II - XII are as  described above under HEENT exam. In addition: shoulder shrug is normal with equal shoulder height noted. Motor exam: Normal bulk, strength and tone is noted. There is no drift, tremor or rebound. Romberg is negative. Reflexes are 1+ in the UEs and trace in the LEs. Fine motor skills and coordination: intact.  Cerebellar testing: No dysmetria or intention tremor on finger to nose testing. There is no truncal or gait ataxia.  Sensory exam: intact to light touch in the upper and lower extremities.  Gait, station and balance: She stands with difficulty due to body habitus. she walks with a cane. She has a limp. She wears her boot.  Assessment and Plan:   In summary, [name] is a very pleasant 54 year old female with a complex medical history of bipolar disorder, migraine headaches, obesity, s/p lap band surgery with regain of weight, memory problems, colonic polyps, diabetes, anxiety, reflux disease, diverticulitis, s/p partial colectomy in 2004 with chronic constipation and knee osteoarthritis with chronic residual pain, on narcotic pain medications, as well as plantar fasciitis on the right, who presents for follow up of her OSA, on CPAP therapy on a pressure of 10 cm with excellent compliance and ongoing good tolerance of the pressure of the mask and ongoing good results. She reported improvement in her daytime somnolence, her nocturia, her sleep quality and sleep consolidations. She recalls having dreams.  she had a baseline sleep study in December 2014 and the CPAP titration study in January 2015. We  talked about her most recent compliance data today. She is congratulated on her treatment adherence. She is fully compliant with treatment. She had a recent exacerbation of her plantar fasciitis. She is wearing a boot. She may need surgery for this. She has gained some weight due to lack of mobility. She is encouraged to drink more water, reduce her soda intake and continue to stay compliant with CPAP  therapy. Otherwise, her exam is stable. She is advised to follow-up with me in a year routinely for sleep apnea checkup. She has previously seen Dr. Jannifer Franklin. She is encouraged to follow-up with him on an as-needed basis as previously discussed. I answered all her questions today and the patient was in agreement with the above outlined plan. I spent 20 minutes in total face-to-face time with the patient, more than 50% of which was spent in counseling and coordination of care, reviewing test results, reviewing medication and discussing or reviewing the diagnosis of OSA, its prognosis and treatment options.

## 2015-01-30 NOTE — Patient Instructions (Signed)

## 2015-02-12 ENCOUNTER — Ambulatory Visit (INDEPENDENT_AMBULATORY_CARE_PROVIDER_SITE_OTHER): Payer: Medicare Other | Admitting: Psychiatry

## 2015-02-12 ENCOUNTER — Encounter (HOSPITAL_COMMUNITY): Payer: Self-pay | Admitting: Psychiatry

## 2015-02-12 VITALS — BP 117/67 | HR 82 | Ht 66.0 in | Wt 303.0 lb

## 2015-02-12 DIAGNOSIS — F1021 Alcohol dependence, in remission: Secondary | ICD-10-CM

## 2015-02-12 DIAGNOSIS — F319 Bipolar disorder, unspecified: Secondary | ICD-10-CM

## 2015-02-12 DIAGNOSIS — F3162 Bipolar disorder, current episode mixed, moderate: Secondary | ICD-10-CM

## 2015-02-12 DIAGNOSIS — F39 Unspecified mood [affective] disorder: Secondary | ICD-10-CM

## 2015-02-12 MED ORDER — TRAZODONE HCL 50 MG PO TABS: 50.0000 mg | ORAL_TABLET | Freq: Every day | ORAL | Status: DC

## 2015-02-12 MED ORDER — ALPRAZOLAM 1 MG PO TABS: 1.0000 mg | ORAL_TABLET | Freq: Two times a day (BID) | ORAL | Status: DC | PRN

## 2015-02-12 MED ORDER — ESCITALOPRAM OXALATE 20 MG PO TABS: 20.0000 mg | ORAL_TABLET | Freq: Every day | ORAL | Status: DC

## 2015-02-12 MED ORDER — ARIPIPRAZOLE 10 MG PO TABS: ORAL_TABLET | ORAL | Status: DC
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
## 2015-02-19 ENCOUNTER — Other Ambulatory Visit (HOSPITAL_COMMUNITY)
Admission: RE | Admit: 2015-02-19 | Discharge: 2015-02-19 | Disposition: A | Discharge status: Home / Self Care | Payer: Medicare Other | Source: Ambulatory Visit | Attending: Obstetrics & Gynecology | Admitting: Obstetrics & Gynecology

## 2015-02-19 ENCOUNTER — Encounter: Payer: Self-pay | Admitting: Obstetrics & Gynecology

## 2015-02-19 ENCOUNTER — Ambulatory Visit (INDEPENDENT_AMBULATORY_CARE_PROVIDER_SITE_OTHER): Payer: Medicare Other | Admitting: Obstetrics & Gynecology

## 2015-02-19 VITALS — BP 106/70 | HR 72 | Wt 304.0 lb

## 2015-02-19 DIAGNOSIS — Z1212 Encounter for screening for malignant neoplasm of rectum: Secondary | ICD-10-CM | POA: Diagnosis not present

## 2015-02-19 DIAGNOSIS — Z01419 Encounter for gynecological examination (general) (routine) without abnormal findings: Secondary | ICD-10-CM

## 2015-02-19 DIAGNOSIS — Z1211 Encounter for screening for malignant neoplasm of colon: Secondary | ICD-10-CM

## 2015-02-19 DIAGNOSIS — Z124 Encounter for screening for malignant neoplasm of cervix: Secondary | ICD-10-CM
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
## 2015-02-24 LAB — CYTOLOGY - PAP

## 2015-04-08 ENCOUNTER — Telehealth (HOSPITAL_COMMUNITY): Payer: Self-pay | Admitting: *Deleted

## 2015-05-16 ENCOUNTER — Telehealth (HOSPITAL_COMMUNITY): Payer: Self-pay | Admitting: *Deleted

## 2015-05-16 NOTE — Telephone Encounter (Signed)
noted 

## 2015-05-16 NOTE — Telephone Encounter (Signed)
Per phone call from pt, she is going to rehab for her stress fracture on her foot and will be there for the the month of January. Per pt she just did not want providers to think shes not coming anymore. Per pt, she will call office back when she gets out of rehab.

## 2015-05-20 ENCOUNTER — Ambulatory Visit (HOSPITAL_COMMUNITY): Payer: Self-pay | Admitting: Psychiatry

## 2015-07-03 ENCOUNTER — Ambulatory Visit: Payer: Self-pay | Admitting: Orthopaedic Surgery

## 2015-07-07 ENCOUNTER — Telehealth: Payer: Self-pay | Admitting: Orthopaedic Surgery

## 2015-07-07 NOTE — Telephone Encounter (Signed)
Patient called, states she had called our office earlier this afternoon as well, to inquire about immediate appointment for "excruciating pain in my knee" -- which is a new problem.  Patient states had a fall in the past couple of days, and thinks she may have a meniscal tear, as pain is similar to what she's had in past.  Said she knows she has appointment in March but wants to see Dr Luna Glasgow immediately.  Relayed Dr Luna Glasgow is not in office today, and due to patient's increasing pain and symptoms, said she will just have her son take her on to the Emergency Room.  Agreed and relayed to patient that this is best recommendation, as she will be able to get complete work-up there.  Said she will follow up with [place] with anything else she may need after that.

## 2015-07-08 ENCOUNTER — Ambulatory Visit (INDEPENDENT_AMBULATORY_CARE_PROVIDER_SITE_OTHER): Payer: Medicare Other | Admitting: Orthopaedic Surgery

## 2015-07-08 ENCOUNTER — Ambulatory Visit (INDEPENDENT_AMBULATORY_CARE_PROVIDER_SITE_OTHER): Payer: Medicare Other

## 2015-07-08 VITALS — BP 137/90 | HR 81 | Temp 97.9°F | Ht 66.0 in | Wt 295.6 lb

## 2015-07-08 DIAGNOSIS — M25561 Pain in right knee: Secondary | ICD-10-CM

## 2015-07-08 NOTE — Telephone Encounter (Signed)
Patient called back today, 07/08/15; spoke with Angie, per request, and was scheduled for next available appointment.  She did not go on to the Emergency room last evening after speaking with our office, which is our practice protocol following a fall and for times outside of clinic hours when a provider is not here in the office.

## 2015-07-08 NOTE — Progress Notes (Addendum)
Patient [id number] [name], female DOB:12-18-1960, 55 y.o. [id number]  Chief Complaint  Patient presents with  . Knee Injury    right knee pain and popping d/t fall 07/06/15    HPI  SHELLY BEARFIELD is a 55 y.o. female who fell two days ago when trying to move a curtain.  She fell off a step stool.  She fell and hurt her right knee.  She has swelling and pain and popping and giving way.  She has no redness, no numbness.  She is taking her pain medicine.  She is using ice but her knee still hurts.  She is using a walker. I want to get a MRI of the knee.  HPI  Body mass index is 47.73 kg/(m^2).   Review of Systems  Constitutional:       Patient has Diabetes Mellitus. Patient has hypertension. Patient does not have COPD or shortness of breath. Patient has BMI > 35. Patient does not have current smoking history.  HENT: Negative for congestion.   Respiratory: Negative for cough and shortness of breath.   Cardiovascular: Negative for chest pain.  Endocrine: Positive for cold intolerance.  Musculoskeletal: Positive for joint swelling, arthralgias and gait problem.  Allergic/Immunologic: Positive for environmental allergies.  Neurological: Positive for headaches.  Psychiatric/Behavioral: The patient is nervous/anxious.     Past Medical History  Diagnosis Date  . Diabetes mellitus   . Migraine   . Arthritis   . Constipation   . Generalized headaches   . Diverticula of colon 2009  . Adenomatous polyp 2009  . Anxiety   . Depression   . Pelvic floor dysfunction     abnormal anorectal manometry at [facility] in 2009  . Diabetes mellitus, type II ([place])   . Psoriasis   . Bipolar 1 disorder ([place])   . Obesity   . Plantar fasciitis     Past Surgical History  Procedure Laterality Date  . Colon surgery      complicated diverticulitis requiring sigmoid resection with colostomy and subsequent takedown  . Laparoscopic gastric banding  2009  . Heel spur surgery  09/11/13  . Abdominal  hysterectomy    . Knee surgery      3 arthroscopic   . Colonoscopy  12/11/2007    Dr. Gala Romney- marginal prep, normal rectum pancolonic diverticula, adenomatous polyp  . Tubal ligation    . Colonoscopy  08/28/2003       Wide open colonic anastomosis/Scattered diverticula noted throughout colon/ Small external hemorrhoids  . Colon resection    03/30/2002    with end-colostomy and Hartmann's pouch  . Laparoscopic salpingoopherectomy  03/30/2002  . Colostomy closure  07/10/2002  . Knee arthroscopy  02/04/2004     left knee/partial medial meniscectomy.  . Flexible sigmoidoscopy  01/20/2012    RMR: incomplete/attempted colonoscopy. Inadequate prep precluded examination  . Colonoscopy  04/2012    [facility]: hyperplastic polyps, diverticulosis, ileocolonic anastomosis.    Family History  Problem Relation Age of Onset  . Ovarian cancer Mother   . Heart disease Mother   . Anxiety disorder Mother   . Cirrhosis Father     deceased age 43  . Alcohol abuse Father   . Colon cancer Other     aunt, deceased age 44  . Breast cancer Other     aunt, deceased age 97  . Liver cancer Cousin     age 43, deceased  . Drug abuse Cousin   . ADD / ADHD Son   . Anxiety  disorder Sister   . Dementia Maternal Grandfather   . Bipolar disorder Neg Hx   . Depression Neg Hx   . OCD Neg Hx   . Paranoid behavior Neg Hx   . Schizophrenia Neg Hx   . Seizures Neg Hx   . Sexual abuse Neg Hx   . Physical abuse Neg Hx     Social History Social History  Substance Use Topics  . Smoking status: Never Smoker   . Smokeless tobacco: Never Used  . Alcohol Use: No     Comment: occasionally    Allergies  Allergen Reactions  . Bactrim Itching, Nausea And Vomiting and Other (See Comments)    Redness  . Neurontin [Gabapentin] Palpitations and Other (See Comments)    confused  . Codeine Nausea And Vomiting and Rash    Current Outpatient Prescriptions  Medication Sig Dispense Refill  . ALPRAZolam (XANAX) 1 MG tablet  Take 1 tablet (1 mg total) by mouth 2 (two) times daily as needed for anxiety. 180 tablet 2  . ARIPiprazole (ABILIFY) 10 MG tablet Take one at bedtime 90 tablet 2  . Calcium Carb-Cholecalciferol 500-600 MG-UNIT CHEW Chew 1 tablet by mouth 3 (three) times daily.    . cycloSPORINE (RESTASIS) 0.05 % ophthalmic emulsion Place 1 drop into both eyes 2 (two) times daily.    Marland Kitchen escitalopram (LEXAPRO) 20 MG tablet Take 1 tablet (20 mg total) by mouth daily. 90 tablet 2  . exenatide (BYETTA) 10 MCG/0.04ML SOLN Inject 10 mcg into the skin 2 (two) times daily with a meal.    . fluticasone (FLONASE) 50 MCG/ACT nasal spray Place 2 sprays into the nose daily as needed. Allergies    . Linaclotide (LINZESS) 290 MCG CAPS capsule Take 1 capsule (290 mcg total) by mouth daily. 90 capsule 3  . lisinopril (PRINIVIL,ZESTRIL) 5 MG tablet Take 5 mg by mouth daily.    Marland Kitchen loratadine (CLARITIN) 10 MG tablet Take 10 mg by mouth daily.    . meclizine (ANTIVERT) 25 MG tablet Take 25 mg by mouth.  2  . metFORMIN (GLUCOPHAGE) 500 MG tablet Take 500 mg by mouth 2 (two) times daily.    . metroNIDAZOLE (FLAGYL) 500 MG tablet Take 1 tablet (500 mg total) by mouth 3 (three) times daily. 42 tablet 0  . Multiple Vitamin (MULTIVITAMIN PO) Take 1 tablet by mouth daily.     Marland Kitchen oxyCODONE-acetaminophen (PERCOCET/ROXICET) 5-325 MG per tablet 1 tablet every 6 (six) hours as needed.    . pantoprazole (PROTONIX) 40 MG tablet Take 1 tablet (40 mg total) by mouth 2 (two) times daily. 180 tablet 3  . polyethylene glycol (MIRALAX / GLYCOLAX) packet Take 17 g by mouth daily.    Marland Kitchen Propylene Glycol-Glycerin (SOOTHE) 0.6-0.6 % SOLN Place 1 drop into both eyes 2 (two) times daily.    . rizatriptan (MAXALT-MLT) 10 MG disintegrating tablet TAKE 1 TABLET AS NEEDED FOR MIGRAINE, MAY REPEAT IN 2 HOURS IF NEEDED 12 tablet 0  . traZODone (DESYREL) 50 MG tablet Take 1 tablet (50 mg total) by mouth at bedtime. 90 tablet 2  . ciprofloxacin (CIPRO) 500 MG tablet Take  1 tablet (500 mg total) by mouth 2 (two) times daily. (Patient not taking: Reported on 02/12/2015) 28 tablet 0  . HYDROcodone-acetaminophen (NORCO/VICODIN) 5-325 MG per tablet Take 1 tablet by mouth every 6 (six) hours as needed for moderate pain. Reported on 07/08/2015    . topiramate (TOPAMAX) 50 MG tablet Take 1 tablet (50 mg total) by  mouth 2 (two) times daily. (Patient not taking: Reported on 07/08/2015) 180 tablet 3   No current facility-administered medications for this visit.     Physical Exam  Blood pressure 137/90, pulse 81, temperature 97.9 F (36.6 C), height 5\' 6"  (1.676 m), weight 295 lb 9.6 oz (134.083 kg).  Constitutional: overall normal hygiene, normal nutrition, well developed, normal grooming, normal body habitus. Assistive device:walker  Musculoskeletal: gait and station Limp right, muscle tone and strength are normal, no tremors or atrophy is present.  .  Neurological: coordination overall normal.  Deep tendon reflex/nerve stretch intact.  Sensation normal.  Cranial nerves II-XII intact.   Skin:   normal overall no scars, lesions, ulcers or rashes. No psoriasis.  Psychiatric: Alert and oriented x 3.  Recent memory intact, remote memory unclear.  Normal mood and affect. Well groomed.  Good eye contact.  Cardiovascular: overall no swelling, no varicosities, no edema bilaterally, normal temperatures of the legs and arms, no clubbing, cyanosis and good capillary refill.  Lymphatic: palpation is normal.  The right lower extremity is examined:  Inspection:  Thigh:  Non-tender and no defects  Knee has swelling 2+ effusion.                        Joint tenderness is present                        Patient is tender over the medial joint line  Lower Leg:  Has normal appearance and no tenderness or defects  Ankle:  Non-tender and no defects  Foot:  Non-tender and no defects Range of Motion:  Knee:  Range of motion is: 0 to 95                        Crepitus is   present  Ankle:  Range of motion is normal. Strength and Tone:  The right lower extremity has normal strength and tone. Stability:  Knee:  The knee has 1+ drawer sign.  Ankle:  The ankle is stable.   Additional services performed: x-rays of the right knee were done and on a separate report.  The patient has been educated about the nature of the problem(s) and counseled on treatment options.  The patient appeared to understand what I have discussed and is in agreement with it.  Her diabetes is well controlled now.  Her last A1C ws 7.8.  She has been trying to lose weight but that has not been going well.  She will continue to do this.  Her hypertension is well controlled but she has some swelling of the feet and ankles at times.  She watches her salt intake. Encounter Diagnosis  Name Primary?  . Right knee pain Yes    PLAN Call if any problems.  Precautions discussed.  Continue current medications.  Get MRI of the right knee.  Continue walker and ice. Return to clinic after MRI is done

## 2015-07-08 NOTE — Patient Instructions (Signed)
We will schedule MRI for you and call you with appt. 

## 2015-07-10 ENCOUNTER — Ambulatory Visit: Payer: Self-pay | Admitting: Orthopaedic Surgery

## 2015-07-10 ENCOUNTER — Other Ambulatory Visit: Payer: Self-pay | Admitting: *Deleted

## 2015-07-10 DIAGNOSIS — M25561 Pain in right knee: Secondary | ICD-10-CM

## 2015-07-16 ENCOUNTER — Telehealth (HOSPITAL_COMMUNITY): Payer: Self-pay | Admitting: *Deleted

## 2015-07-16 ENCOUNTER — Ambulatory Visit (HOSPITAL_COMMUNITY): Payer: Self-pay | Admitting: Psychiatry

## 2015-07-16 NOTE — Telephone Encounter (Signed)
Pt called at 2:48pm on 07-16-15 stating she was on her way to appt and she passed out and is about to go to the hospital. Per pt she will call back to resch appt.

## 2015-07-17 ENCOUNTER — Ambulatory Visit (HOSPITAL_COMMUNITY)
Admission: RE | Admit: 2015-07-17 | Discharge: 2015-07-17 | Disposition: A | Discharge status: Home / Self Care | Payer: Medicare Other | Source: Ambulatory Visit | Attending: Orthopedic Surgery | Admitting: Orthopedic Surgery

## 2015-07-17 ENCOUNTER — Ambulatory Visit: Payer: Self-pay | Admitting: Orthopaedic Surgery

## 2015-07-17 DIAGNOSIS — S83231A Complex tear of medial meniscus, current injury, right knee, initial encounter: Secondary | ICD-10-CM | POA: Diagnosis not present

## 2015-07-17 DIAGNOSIS — X58XXXA Exposure to other specified factors, initial encounter: Secondary | ICD-10-CM | POA: Diagnosis not present

## 2015-07-17 DIAGNOSIS — M25561 Pain in right knee: Secondary | ICD-10-CM | POA: Diagnosis present

## 2015-07-17 DIAGNOSIS — Y939 Activity, unspecified: Secondary | ICD-10-CM | POA: Diagnosis not present

## 2015-07-17 DIAGNOSIS — Y929 Unspecified place or not applicable: Secondary | ICD-10-CM | POA: Insufficient documentation

## 2015-07-24 ENCOUNTER — Ambulatory Visit (HOSPITAL_COMMUNITY): Payer: Medicare Other

## 2015-07-29 ENCOUNTER — Ambulatory Visit (INDEPENDENT_AMBULATORY_CARE_PROVIDER_SITE_OTHER): Payer: Medicare Other | Admitting: Orthopaedic Surgery

## 2015-07-29 VITALS — BP 156/83 | HR 93 | Temp 98.1°F | Ht 67.0 in | Wt 275.0 lb

## 2015-07-29 DIAGNOSIS — M25562 Pain in left knee: Secondary | ICD-10-CM | POA: Diagnosis not present

## 2015-07-29 DIAGNOSIS — M25561 Pain in right knee: Secondary | ICD-10-CM | POA: Diagnosis not present

## 2015-07-29 MED ORDER — OXYCODONE-ACETAMINOPHEN 5-325 MG PO TABS: 1.0000 | ORAL_TABLET | ORAL | Status: DC | PRN

## 2015-07-29 NOTE — Progress Notes (Signed)
Patient [id number] [name], female DOB:10-13-1960, 55 y.o. [id number]  Chief Complaint  Patient presents with  . Follow-up    MRI Results and Orthovisc #1 of 3 Left knee    HPI  [name] is a 55 y.o. female who has pain to both knees chronically.  She had a new injury to the right knee.  She had a MRI of the right knee done 07-17-15 showing severe tricompartmental degenerative changes as well as a tear of the medial meniscus of the right knee.  She really needs a total knee. She would need to lose weight first.  She has a band that was tightened recently to help with this.  She has right knee swelling as well as left knee swelling.  She has no giving way or locking.  She has popping.  I had talked to her previously about Orthovisc to the knees.  I had given a Rx for this.  I had told her that I could inject the knees weekly over three weeks but not to expect sudden and complete resolution of her pain.  She had delayed in getting the medicine but today she has it.  She would like to have the injections done today.  HPI  Body mass index is 43.06 kg/(m^2).   Review of Systems  Constitutional:       Patient has Diabetes Mellitus. Patient has hypertension. Patient does not have COPD or shortness of breath. Patient has BMI > 35. Patient does not have current smoking history.  HENT: Negative for congestion.   Respiratory: Positive for cough. Negative for shortness of breath.   Endocrine: Positive for cold intolerance.  Musculoskeletal: Positive for joint swelling, arthralgias and gait problem.  Allergic/Immunologic: Positive for environmental allergies.  Neurological: Positive for headaches.  Psychiatric/Behavioral: The patient is nervous/anxious.     Past Medical History  Diagnosis Date  . Diabetes mellitus   . Migraine   . Arthritis   . Constipation   . Generalized headaches   . Diverticula of colon 2009  . Adenomatous polyp 2009  . Anxiety   . Depression   .  Pelvic floor dysfunction     abnormal anorectal [facility]ometry at [facility] in 2009  . Diabetes mellitus, type II ([place])   . Psoriasis   . Bipolar 1 disorder ([place])   . Obesity   . Plantar fasciitis     Past Surgical History  Procedure Laterality Date  . Colon surgery      complicated diverticulitis requiring sigmoid resection with colostomy and subsequent takedown  . Laparoscopic gastric banding  2009  . Heel spur surgery  09/11/13  . Abdominal hysterectomy    . Knee surgery      3 arthroscopic   . Colonoscopy  12/11/2007    Dr. Gala Romney- marginal prep, normal rectum pancolonic diverticula, adenomatous polyp  . Tubal ligation    . Colonoscopy  08/28/2003       Wide open colonic anastomosis/Scattered diverticula noted throughout colon/ Small external hemorrhoids  . Colon resection    03/30/2002    with end-colostomy and Hartmann's pouch  . Laparoscopic salpingoopherectomy  03/30/2002  . Colostomy closure  07/10/2002  . Knee arthroscopy  02/04/2004     left knee/partial medial meniscectomy.  . Flexible sigmoidoscopy  01/20/2012    RMR: incomplete/attempted colonoscopy. Inadequate prep precluded examination  . Colonoscopy  04/2012    [facility]: hyperplastic polyps, diverticulosis, ileocolonic anastomosis.    Family History  Problem Relation Age of Onset  . Ovarian cancer  Mother   . Heart disease Mother   . Anxiety disorder Mother   . Cirrhosis Father     deceased age 58  . Alcohol abuse Father   . Colon cancer Other     aunt, deceased age 66  . Breast cancer Other     aunt, deceased age 12  . Liver cancer Cousin     age 12, deceased  . Drug abuse Cousin   . ADD / ADHD Son   . Anxiety disorder Sister   . Dementia Maternal Grandfather   . Bipolar disorder Neg Hx   . Depression Neg Hx   . OCD Neg Hx   . Paranoid behavior Neg Hx   . Schizophrenia Neg Hx   . Seizures Neg Hx   . Sexual abuse Neg Hx   . Physical abuse Neg Hx     Social History Social History  Substance Use Topics  .  Smoking status: Never Smoker   . Smokeless tobacco: Never Used  . Alcohol Use: No     Comment: occasionally    Allergies  Allergen Reactions  . Bactrim Itching, Nausea And Vomiting and Other (See Comments)    Redness  . Neurontin [Gabapentin] Palpitations and Other (See Comments)    confused  . Codeine Nausea And Vomiting and Rash    Current Outpatient Prescriptions  Medication Sig Dispense Refill  . ALPRAZolam (XANAX) 1 MG tablet Take 1 tablet (1 mg total) by mouth 2 (two) times daily as needed for anxiety. 180 tablet 2  . ARIPiprazole (ABILIFY) 10 MG tablet Take one at bedtime 90 tablet 2  . Calcium Carb-Cholecalciferol 500-600 MG-UNIT CHEW Chew 1 tablet by mouth 3 (three) times daily.    . ciprofloxacin (CIPRO) 500 MG tablet Take 1 tablet (500 mg total) by mouth 2 (two) times daily. (Patient not taking: Reported on 02/12/2015) 28 tablet 0  . cycloSPORINE (RESTASIS) 0.05 % ophthalmic emulsion Place 1 drop into both eyes 2 (two) times daily.    Marland Kitchen escitalopram (LEXAPRO) 20 MG tablet Take 1 tablet (20 mg total) by mouth daily. 90 tablet 2  . exenatide (BYETTA) 10 MCG/0.04ML SOLN Inject 10 mcg into the skin 2 (two) times daily with a meal.    . fluticasone (FLONASE) 50 MCG/ACT nasal spray Place 2 sprays into the nose daily as needed. Allergies    . Linaclotide (LINZESS) 290 MCG CAPS capsule Take 1 capsule (290 mcg total) by mouth daily. 90 capsule 3  . lisinopril (PRINIVIL,ZESTRIL) 5 MG tablet Take 5 mg by mouth daily.    Marland Kitchen loratadine (CLARITIN) 10 MG tablet Take 10 mg by mouth daily.    . meclizine (ANTIVERT) 25 MG tablet Take 25 mg by mouth.  2  . metFORMIN (GLUCOPHAGE) 500 MG tablet Take 500 mg by mouth 2 (two) times daily.    . metroNIDAZOLE (FLAGYL) 500 MG tablet Take 1 tablet (500 mg total) by mouth 3 (three) times daily. 42 tablet 0  . Multiple Vitamin (MULTIVITAMIN PO) Take 1 tablet by mouth daily.     Marland Kitchen oxyCODONE-acetaminophen (PERCOCET/ROXICET) 5-325 MG tablet Take 1 tablet by  mouth every 4 (four) hours as needed for moderate pain or severe pain (Must last 30 days.  Do not drive or operate machinery while taking this medicine). 120 tablet 0  . pantoprazole (PROTONIX) 40 MG tablet Take 1 tablet (40 mg total) by mouth 2 (two) times daily. 180 tablet 3  . polyethylene glycol (MIRALAX / GLYCOLAX) packet Take 17 g by mouth  daily.    . Propylene Glycol-Glycerin (SOOTHE) 0.6-0.6 % SOLN Place 1 drop into both eyes 2 (two) times daily.    . rizatriptan (MAXALT-MLT) 10 MG disintegrating tablet TAKE 1 TABLET AS NEEDED FOR MIGRAINE, MAY REPEAT IN 2 HOURS IF NEEDED 12 tablet 0  . topiramate (TOPAMAX) 50 MG tablet Take 1 tablet (50 mg total) by mouth 2 (two) times daily. (Patient not taking: Reported on 07/08/2015) 180 tablet 3  . traZODone (DESYREL) 50 MG tablet Take 1 tablet (50 mg total) by mouth at bedtime. 90 tablet 2   No current facility-administered medications for this visit.     Physical Exam  Blood pressure 156/83, pulse 93, temperature 98.1 F (36.7 C), height 5\' 7"  (1.702 m), weight 275 lb (124.739 kg).  Constitutional: overall normal hygiene, normal nutrition, well developed, normal grooming, normal body habitus. Assistive device:none  Musculoskeletal: gait and station Limp to right, muscle tone and strength are normal, no tremors or atrophy is present.  .  Neurological: coordination overall normal.  Deep tendon reflex/nerve stretch intact.  Sensation normal.  Cranial nerves II-XII intact.   Skin:   normal overall no scars, lesions, ulcers or rashes. No psoriasis.  Psychiatric: Alert and oriented x 3.  Recent memory intact, remote memory unclear.  Normal mood and affect. Well groomed.  Good eye contact.  Cardiovascular: overall no swelling, no varicosities, no edema bilaterally, normal temperatures of the legs and arms, no clubbing, cyanosis and good capillary refill.  Lymphatic: palpation is normal.  PROCEDURE NOTE:  OrthoVisc injection #  1 of 3    Injection 1 vial of synvisc into bilateral  knees  BP 156/83 mmHg  Pulse 93  Temp(Src) 98.1 F (36.7 C)  Ht 5\' 7"  (1.702 m)  Wt 275 lb (124.739 kg)  BMI 43.06 kg/m2  The bilateral knee exam: there was no synovitis or infection   The knee was prepped sterilely  Ethyl chloride was used to anesthetize the skin A 20 g needle was used to inject each knee with 1 vial of OrthoVisc A sterile dressing was placed  There were no complications  Follow up one week.  Additional services performed: I reviewed the MRI report of the right knee and explained the findings to her as well as reviewed the MRI.  I talked to her about weight reduction.    I went over her general health issues and she is going to do her best to control her health better.  The patient has been educated about the nature of the problem(s) and counseled on treatment options.  The patient appeared to understand what I have discussed and is in agreement with it.  PLAN Call if any problems.  Precautions discussed.  Continue current medications.   Return to clinic one week

## 2015-07-29 NOTE — Progress Notes (Signed)
Orthovisc lot [id number] A exp 05/17

## 2015-08-05 ENCOUNTER — Ambulatory Visit (INDEPENDENT_AMBULATORY_CARE_PROVIDER_SITE_OTHER): Payer: Medicare Other | Admitting: Orthopaedic Surgery

## 2015-08-05 ENCOUNTER — Encounter: Payer: Self-pay | Admitting: Orthopaedic Surgery

## 2015-08-05 VITALS — BP 141/98 | HR 76 | Temp 98.1°F | Resp 16 | Ht 67.0 in | Wt 275.0 lb

## 2015-08-05 DIAGNOSIS — M25562 Pain in left knee: Secondary | ICD-10-CM | POA: Diagnosis not present

## 2015-08-05 DIAGNOSIS — M25561 Pain in right knee: Secondary | ICD-10-CM

## 2015-08-05 NOTE — Progress Notes (Signed)
CC:  I am ready for my second shots  She is here for bilateral Orthovisc injections of the knees.  She has no redness.  She has effusion of the knees and popping.  ROM right is 0-100 and left 0-100 as well.  PROCEDURE NOTE:  Orthovisc injection #  2 of 3   Injection 1 vial of orthovisc into bilateral  knees  BP 141/98 mmHg  Pulse 76  Temp(Src) 98.1 F (36.7 C)  Resp 16  Ht 5\' 7"  (1.702 m)  Wt 275 lb (124.739 kg)  BMI 43.06 kg/m2  The bilateral knee exam: there was no synovitis or infection   The knees were prepped sterilely  Ethyl chloride was used to anesthetize the skin A 20 g needle was used to inject the knee with 1 vial of orthovisc. A sterile dressing was placed  There were no complications The same was done for both knees.  Follow up one week for last round

## 2015-08-06 NOTE — Addendum Note (Signed)
Addended by: Willette Pa on: 08/06/2015 09:36 PM   Modules accepted: Miquel Dunn

## 2015-08-12 ENCOUNTER — Encounter: Payer: Self-pay | Admitting: Orthopaedic Surgery

## 2015-08-12 ENCOUNTER — Ambulatory Visit (INDEPENDENT_AMBULATORY_CARE_PROVIDER_SITE_OTHER): Payer: Medicare Other | Admitting: Orthopaedic Surgery

## 2015-08-12 VITALS — BP 131/83 | HR 75 | Temp 97.3°F | Resp 16 | Ht 67.0 in | Wt 275.0 lb

## 2015-08-12 DIAGNOSIS — M25562 Pain in left knee: Secondary | ICD-10-CM

## 2015-08-12 DIAGNOSIS — M25561 Pain in right knee: Secondary | ICD-10-CM | POA: Diagnosis not present

## 2015-08-12 NOTE — Progress Notes (Signed)
CC:  I am here for my last injections.  She has had two prior injections of Orthovisc.  She is here for final injections.  She has less pain in both knees.   Both knees have pain and mild effusions.  She has no redness.  IMPRESSION:  Bilateral knee pain, DJD  PROCEDURE NOTE:  Orthovisc injection #  3 of 3   Injection 1 vial of Orthovisc  into bilateral  knees  BP 131/83 mmHg  Pulse 75  Temp(Src) 97.3 F (36.3 C)  Resp 16  Ht 5\' 7"  (1.702 m)  Wt 275 lb (124.739 kg)  BMI 43.06 kg/m2  The bilateral  knee exam: there was no synovitis or infection   The knee was prepped sterilely  Ethyl chloride was used to anesthetize the skin A 20 g needle was used to inject the knee with 1 vial of supartz  A sterile dressing was placed  There were no complications  This was done for both knees.  Follow up one month.

## 2015-09-09 ENCOUNTER — Encounter: Payer: Self-pay | Admitting: Orthopaedic Surgery

## 2015-09-09 ENCOUNTER — Ambulatory Visit (INDEPENDENT_AMBULATORY_CARE_PROVIDER_SITE_OTHER): Payer: Medicare Other | Admitting: Orthopaedic Surgery

## 2015-09-09 VITALS — BP 140/88 | HR 70 | Temp 97.3°F | Ht 67.0 in | Wt 275.0 lb

## 2015-09-09 DIAGNOSIS — R1310 Dysphagia, unspecified: Secondary | ICD-10-CM

## 2015-09-09 DIAGNOSIS — M25562 Pain in left knee: Secondary | ICD-10-CM

## 2015-09-09 DIAGNOSIS — M25561 Pain in right knee: Secondary | ICD-10-CM | POA: Diagnosis not present

## 2015-09-09 DIAGNOSIS — R1314 Dysphagia, pharyngoesophageal phase: Secondary | ICD-10-CM | POA: Diagnosis not present

## 2015-09-09 DIAGNOSIS — R1319 Other dysphagia: Secondary | ICD-10-CM

## 2015-09-09 NOTE — Progress Notes (Signed)
[name], female DOB:12-29-1960, 55 y.o. [id number]  Chief Complaint  Patient presents with  . Follow-up    Bilateral knee pain s/p synvisc injections    HPI  [name] is a 55 y.o. female who has bilateral knee pain.  She had the viscosupplemention injections and is much improved today.  She has less pain, less swelling, no giving way, no locking.  She has no trauma.  She has stopped using her cane.  She is very pleased with her progress.  HPI  Body mass index is 43.06 kg/(m^2).   Review of Systems  Constitutional:       Patient has Diabetes Mellitus. Patient has hypertension. Patient does not have COPD or shortness of breath. Patient has BMI > 35. Patient does not have current smoking history.  HENT: Negative for congestion.   Respiratory: Positive for cough. Negative for shortness of breath.   Endocrine: Positive for cold intolerance.  Musculoskeletal: Positive for joint swelling, arthralgias and gait problem.  Allergic/Immunologic: Positive for environmental allergies.  Neurological: Positive for headaches.  Psychiatric/Behavioral: The patient is nervous/anxious.     Past Medical History  Diagnosis Date  . Diabetes mellitus   . Migraine   . Arthritis   . Constipation   . Generalized headaches   . Diverticula of colon 2009  . Adenomatous polyp 2009  . Anxiety   . Depression   . Pelvic floor dysfunction     abnormal anorectal manometry at [facility] in 2009  . Diabetes mellitus, type II ([place])   . Psoriasis   . Bipolar 1 disorder ([place])   . Obesity   . Plantar fasciitis     Past Surgical History  Procedure Laterality Date  . Colon surgery      complicated diverticulitis requiring sigmoid resection with colostomy and subsequent takedown  . Laparoscopic gastric banding  2009  . Heel spur surgery  09/11/13  . Abdominal hysterectomy    . Knee surgery      3 arthroscopic   . Colonoscopy  12/11/2007    Dr. Gala Romney- marginal prep, normal  rectum pancolonic diverticula, adenomatous polyp  . Tubal ligation    . Colonoscopy  08/28/2003       Wide open colonic anastomosis/Scattered diverticula noted throughout colon/ Small external hemorrhoids  . Colon resection    03/30/2002    with end-colostomy and Hartmann's pouch  . Laparoscopic salpingoopherectomy  03/30/2002  . Colostomy closure  07/10/2002  . Knee arthroscopy  02/04/2004     left knee/partial medial meniscectomy.  . Flexible sigmoidoscopy  01/20/2012    [facility]: incomplete/attempted colonoscopy. Inadequate prep precluded examination  . Colonoscopy  04/2012    [facility]: hyperplastic polyps, diverticulosis, ileocolonic anastomosis.    Family History  Problem Relation Age of Onset  . Ovarian cancer Mother   . Heart disease Mother   . Anxiety disorder Mother   . Cirrhosis Father     deceased age 68  . Alcohol abuse Father   . Colon cancer Other     aunt, deceased age 70  . Breast cancer Other     aunt, deceased age 38  . Liver cancer Cousin     age 56, deceased  . Drug abuse Cousin   . ADD / ADHD Son   . Anxiety disorder Sister   . Dementia Maternal Grandfather   . Bipolar disorder Neg Hx   . Depression Neg Hx   . OCD Neg Hx   . Paranoid behavior Neg Hx   .  Schizophrenia Neg Hx   . Seizures Neg Hx   . Sexual abuse Neg Hx   . Physical abuse Neg Hx     Social History Social History  Substance Use Topics  . Smoking status: Never Smoker   . Smokeless tobacco: Never Used  . Alcohol Use: No     Comment: occasionally    Allergies  Allergen Reactions  . Bactrim Itching, Nausea And Vomiting and Other (See Comments)    Redness  . Neurontin [Gabapentin] Palpitations and Other (See Comments)    confused  . Codeine Nausea And Vomiting and Rash    Current Outpatient Prescriptions  Medication Sig Dispense Refill  . ALPRAZolam (XANAX) 1 MG tablet Take 1 tablet (1 mg total) by mouth 2 (two) times daily as needed for anxiety. 180 tablet 2  . ARIPiprazole (ABILIFY)  10 MG tablet Take one at bedtime 90 tablet 2  . Calcium Carb-Cholecalciferol 500-600 MG-UNIT CHEW Chew 1 tablet by mouth 3 (three) times daily.    . ciprofloxacin (CIPRO) 500 MG tablet Take 1 tablet (500 mg total) by mouth 2 (two) times daily. 28 tablet 0  . cycloSPORINE (RESTASIS) 0.05 % ophthalmic emulsion Place 1 drop into both eyes 2 (two) times daily.    Marland Kitchen escitalopram (LEXAPRO) 20 MG tablet Take 1 tablet (20 mg total) by mouth daily. 90 tablet 2  . exenatide (BYETTA) 10 MCG/0.04ML SOLN Inject 10 mcg into the skin 2 (two) times daily with a meal.    . fluticasone (FLONASE) 50 MCG/ACT nasal spray Place 2 sprays into the nose daily as needed. Allergies    . Linaclotide (LINZESS) 290 MCG CAPS capsule Take 1 capsule (290 mcg total) by mouth daily. 90 capsule 3  . lisinopril (PRINIVIL,ZESTRIL) 5 MG tablet Take 5 mg by mouth daily.    Marland Kitchen loratadine (CLARITIN) 10 MG tablet Take 10 mg by mouth daily.    . meclizine (ANTIVERT) 25 MG tablet Take 25 mg by mouth.  2  . metFORMIN (GLUCOPHAGE) 500 MG tablet Take 500 mg by mouth 2 (two) times daily.    . metroNIDAZOLE (FLAGYL) 500 MG tablet Take 1 tablet (500 mg total) by mouth 3 (three) times daily. 42 tablet 0  . Multiple Vitamin (MULTIVITAMIN PO) Take 1 tablet by mouth daily.     Marland Kitchen oxyCODONE-acetaminophen (PERCOCET/ROXICET) 5-325 MG tablet Take 1 tablet by mouth every 4 (four) hours as needed for moderate pain or severe pain (Must last 30 days.  Do not drive or operate machinery while taking this medicine). 120 tablet 0  . pantoprazole (PROTONIX) 40 MG tablet Take 1 tablet (40 mg total) by mouth 2 (two) times daily. 180 tablet 3  . polyethylene glycol (MIRALAX / GLYCOLAX) packet Take 17 g by mouth daily.    Marland Kitchen Propylene Glycol-Glycerin (SOOTHE) 0.6-0.6 % SOLN Place 1 drop into both eyes 2 (two) times daily.    . rizatriptan (MAXALT-MLT) 10 MG disintegrating tablet TAKE 1 TABLET AS NEEDED FOR MIGRAINE, MAY REPEAT IN 2 HOURS IF NEEDED 12 tablet 0  .  topiramate (TOPAMAX) 50 MG tablet Take 1 tablet (50 mg total) by mouth 2 (two) times daily. 180 tablet 3  . traZODone (DESYREL) 50 MG tablet Take 1 tablet (50 mg total) by mouth at bedtime. 90 tablet 2   No current facility-administered medications for this visit.     Physical Exam  Blood pressure 140/88, pulse 70, temperature 97.3 F (36.3 C), height 5\' 7"  (1.702 m), weight 275 lb (124.739 kg).  Constitutional:  overall normal hygiene, normal nutrition, well developed, normal grooming, normal body habitus. Assistive device:none  Musculoskeletal: gait and station Limp none, muscle tone and strength are normal, no tremors or atrophy is present.  .  Neurological: coordination overall normal.  Deep tendon reflex/nerve stretch intact.  Sensation normal.  Cranial nerves II-XII intact.   Skin:   normal overall no scars, lesions, ulcers or rashes. No psoriasis.  Psychiatric: Alert and oriented x 3.  Recent memory intact, remote memory unclear.  Normal mood and affect. Well groomed.  Good eye contact.  Cardiovascular: overall no swelling, no varicosities, no edema bilaterally, normal temperatures of the legs and arms, no clubbing, cyanosis and good capillary refill.  Lymphatic: palpation is normal.  Right Knee Exam   Tenderness  The patient is experiencing tenderness in the medial joint line.  Range of Motion  Extension: 0  Flexion: 110   Tests  McMurray:  Medial - negative  Lachman:  Anterior - negative      Other  Erythema: absent Scars: absent Sensation: normal Pulse: present Swelling: mild   Left Knee Exam   Range of Motion  Extension: 0  Flexion: 110   Tests  McMurray:  Medial - negative  Lachman:  Anterior - negative      Other  Erythema: absent Scars: absent Sensation: normal Pulse: present Swelling: mild     The patient has been educated about the nature of the problem(s) and counseled on treatment options.  The patient appeared to understand what I  have discussed and is in agreement with it.  Encounter Diagnoses  Name Primary?  . Right knee pain Yes  . Left knee pain   . Esophageal dysphagia   . Morbid obesity due to excess calories [facility])     PLAN Call if any problems.  Precautions discussed.  Continue current medications.   Return to clinic 6 weeks

## 2015-10-08 ENCOUNTER — Encounter: Payer: Self-pay | Admitting: Gastroenterology

## 2015-10-08 ENCOUNTER — Ambulatory Visit (INDEPENDENT_AMBULATORY_CARE_PROVIDER_SITE_OTHER): Payer: Medicare Other | Admitting: Gastroenterology

## 2015-10-08 VITALS — BP 162/88 | HR 79 | Temp 97.6°F | Ht 68.0 in | Wt 290.6 lb

## 2015-10-08 DIAGNOSIS — K5900 Constipation, unspecified: Secondary | ICD-10-CM

## 2015-10-08 DIAGNOSIS — K219 Gastro-esophageal reflux disease without esophagitis: Secondary | ICD-10-CM

## 2015-10-08 MED ORDER — PANTOPRAZOLE SODIUM 40 MG PO TBEC: 40.0000 mg | DELAYED_RELEASE_TABLET | Freq: Two times a day (BID) | ORAL | Status: DC

## 2015-10-08 MED ORDER — LINACLOTIDE 290 MCG PO CAPS: 290.0000 ug | ORAL_CAPSULE | Freq: Every day | ORAL | Status: DC

## 2015-10-08 NOTE — Progress Notes (Signed)
Primary Care Physician: Purvis Kilts, MD  Primary Gastroenterologist:  Garfield Cornea, MD   Chief Complaint  Patient presents with  . Abdominal Pain    HPI: [name] is a 55 y.o. female here for follow up. Last seen in 12/2014. H/o complicated diverticulitis requiring surgery in remote. Treated empirically for diverticulitis in 02/2014 and 12/2014. Since we last saw her she had to have fluid removed from her gastric band. She had piece of chicken get caught and took two days to pass. Weight is up 15 pounds.   Really no long-lasting abdominal pain. Fleeting pain in both right and left lower quadrants at times. Never lasts for more than a day. No fever. BM good on Linzess. No melena, brbpr. Heartburn well controlled. No dysphagia. Patient gets worried about her colon a lot. Wondering about having her next TCS this year instead of next.  Patient reminds me that her last colonoscopy was 04/2012 at [facility] after failed attempt at colonoscopy locally due to inadequate prep. She took only 1/2 the prep and ate solid food the night before her TCS. For her TCS at [facility], she mixed two bottles of Miralax in 64 ounces of Gatorade.    Current Outpatient Prescriptions  Medication Sig Dispense Refill  . ALPRAZolam (XANAX) 1 MG tablet Take 1 tablet (1 mg total) by mouth 2 (two) times daily as needed for anxiety. 180 tablet 2  . ARIPiprazole (ABILIFY) 10 MG tablet Take one at bedtime 90 tablet 2  . Calcium Carb-Cholecalciferol 500-600 MG-UNIT CHEW Chew 1 tablet by mouth 3 (three) times daily.    . cycloSPORINE (RESTASIS) 0.05 % ophthalmic emulsion Place 1 drop into both eyes 2 (two) times daily.    Marland Kitchen escitalopram (LEXAPRO) 20 MG tablet Take 1 tablet (20 mg total) by mouth daily. 90 tablet 2  . exenatide (BYETTA) 10 MCG/0.04ML SOLN Inject 10 mcg into the skin 2 (two) times daily with a meal.    . fluticasone (FLONASE) 50 MCG/ACT nasal spray Place 2 sprays into the nose daily as needed.  Allergies    . linaclotide (LINZESS) 290 MCG CAPS capsule Take 1 capsule (290 mcg total) by mouth daily. 90 capsule 3  . lisinopril (PRINIVIL,ZESTRIL) 5 MG tablet Take 5 mg by mouth daily.    Marland Kitchen loratadine (CLARITIN) 10 MG tablet Take 10 mg by mouth daily.    . meclizine (ANTIVERT) 25 MG tablet Take 25 mg by mouth.  2  . metFORMIN (GLUCOPHAGE) 500 MG tablet Take 500 mg by mouth 2 (two) times daily.    . Multiple Vitamin (MULTIVITAMIN PO) Take 1 tablet by mouth daily.     Marland Kitchen oxyCODONE-acetaminophen (PERCOCET/ROXICET) 5-325 MG tablet Take 1 tablet by mouth every 4 (four) hours as needed for moderate pain or severe pain (Must last 30 days.  Do not drive or operate machinery while taking this medicine). 120 tablet 0  . pantoprazole (PROTONIX) 40 MG tablet Take 1 tablet (40 mg total) by mouth 2 (two) times daily. 180 tablet 3  . polyethylene glycol (MIRALAX / GLYCOLAX) packet Take 17 g by mouth daily.    Marland Kitchen Propylene Glycol-Glycerin (SOOTHE) 0.6-0.6 % SOLN Place 1 drop into both eyes 2 (two) times daily.    Marland Kitchen topiramate (TOPAMAX) 50 MG tablet Take 1 tablet (50 mg total) by mouth 2 (two) times daily. 180 tablet 3  . traZODone (DESYREL) 50 MG tablet Take 1 tablet (50 mg total) by mouth at bedtime. 90 tablet 2  .  meloxicam (MOBIC) 15 MG tablet 15 mg 2 (two) times daily.     No current facility-administered medications for this visit.    Allergies as of 10/08/2015 - Review Complete 10/08/2015  Allergen Reaction Noted  . Bactrim Itching, Nausea And Vomiting, and Other (See Comments) 01/09/2011  . Neurontin [gabapentin] Palpitations and Other (See Comments) 07/21/2012  . Codeine Nausea And Vomiting and Rash     ROS:  General: Negative for anorexia, weight loss, fever, chills, fatigue, weakness. ENT: Negative for hoarseness, difficulty swallowing , nasal congestion. CV: Negative for chest pain, angina, palpitations, dyspnea on exertion, peripheral edema.  Respiratory: Negative for dyspnea at rest,  dyspnea on exertion, cough, sputum, wheezing.  GI: See history of present illness. GU:  Negative for dysuria, hematuria, urinary incontinence, urinary frequency, nocturnal urination.  Endo: Negative for unusual weight change.    Physical Examination:   BP 162/88 mmHg  Pulse 79  Temp(Src) 97.6 F (36.4 C) (Oral)  Ht 5\' 8"  (1.727 m)  Wt 290 lb 9.6 oz (131.815 kg)  BMI 44.20 kg/m2  General: Well-nourished, well-developed in no acute distress.  Eyes: No icterus. Mouth: Oropharyngeal mucosa moist and pink , no lesions erythema or exudate. Lungs: Clear to auscultation bilaterally.  Heart: Regular rate and rhythm, no murmurs rubs or gallops.  Abdomen: Bowel sounds are normal, nontender, nondistended, no hepatosplenomegaly or masses, no abdominal bruits or hernia , no rebound or guarding.   Extremities: No lower extremity edema. No clubbing or deformities. Neuro: Alert and oriented x 4   Skin: Warm and dry, no jaundice.   Psych: Alert and cooperative, normal mood and affect.

## 2015-10-08 NOTE — Assessment & Plan Note (Signed)
Doing well on Linzess. She desires ifobt, inquires about when she should have her next TCS. Slated for next year. ifobt now.

## 2015-10-08 NOTE — Assessment & Plan Note (Addendum)
Doing well on pantoprazole BID. Reinforced antireflux measures.

## 2015-10-08 NOTE — Patient Instructions (Signed)
1. Call if you have abdominal pain that lasts for more than 48 hours.  2. Please return stool specimen to our office. We will contact you with results.

## 2015-10-09 NOTE — Progress Notes (Signed)
cc'ed to pcp °

## 2015-10-14 ENCOUNTER — Ambulatory Visit (INDEPENDENT_AMBULATORY_CARE_PROVIDER_SITE_OTHER): Payer: Medicare Other

## 2015-10-14 DIAGNOSIS — K5900 Constipation, unspecified: Secondary | ICD-10-CM

## 2015-10-14 LAB — IFOBT (OCCULT BLOOD): IFOBT: NEGATIVE

## 2015-10-14 NOTE — Progress Notes (Signed)
IFOBT negative 

## 2015-10-24 NOTE — Progress Notes (Signed)
Quick Note:  Please let patient know her ifobt was negative. Would wait until next year for colonoscopy as planned unless she develops new symptoms, change in bowel habits, bleeding, anemia. ______

## 2015-11-19 ENCOUNTER — Ambulatory Visit: Payer: Medicare Other

## 2015-11-19 ENCOUNTER — Encounter: Payer: Self-pay | Admitting: Orthopaedic Surgery

## 2015-11-19 ENCOUNTER — Ambulatory Visit (INDEPENDENT_AMBULATORY_CARE_PROVIDER_SITE_OTHER): Payer: Medicare Other | Admitting: Orthopaedic Surgery

## 2015-11-19 VITALS — BP 111/73 | HR 80 | Temp 97.3°F | Ht 68.0 in | Wt 293.0 lb

## 2015-11-19 DIAGNOSIS — M25562 Pain in left knee: Secondary | ICD-10-CM

## 2015-11-19 DIAGNOSIS — M25561 Pain in right knee: Secondary | ICD-10-CM

## 2015-11-19 MED ORDER — OXYCODONE-ACETAMINOPHEN 5-325 MG PO TABS: 1.0000 | ORAL_TABLET | ORAL | Status: DC | PRN

## 2015-11-19 NOTE — Progress Notes (Signed)
CC: Both of my knees are hurting. I would like an injection in both knees.  The patient has had chronic pain and tenderness of both knees for some time.  Injections help.  There is no locking or giving way of the knee.  There is no new trauma. There is no redness or signs of infections.  The knees have a mild effusion and some crepitus.  There is no redness or signs of recent trauma.  Right knee ROM is 0-100 and left knee ROM is 0-105.  Impression:  Chronic pain of the both knees  Return:  1 month  PROCEDURE NOTE:  The patient requests injections of both knees, verbal consent was obtained.  The left and right knee were individually prepped appropriately after time out was performed.   Sterile technique was observed and injection of 1 cc of Depo-Medrol 40 mg with several cc's of plain xylocaine. Anesthesia was provided by ethyl chloride and a 20-gauge needle was used to inject each knee area. The injections were tolerated well.  A band aid dressing was applied.  The patient was advised to apply ice later today and tomorrow to the injection sight as needed.  Electronically Signed Sanjuana Kava, MD 7/12/20172:14 PM

## 2015-12-16 ENCOUNTER — Other Ambulatory Visit (HOSPITAL_COMMUNITY): Payer: Self-pay | Admitting: Family Medicine

## 2015-12-16 DIAGNOSIS — Z1231 Encounter for screening mammogram for malignant neoplasm of breast: Secondary | ICD-10-CM

## 2015-12-23 ENCOUNTER — Ambulatory Visit (INDEPENDENT_AMBULATORY_CARE_PROVIDER_SITE_OTHER): Payer: Medicare Other | Admitting: Orthopaedic Surgery

## 2015-12-23 ENCOUNTER — Encounter: Payer: Self-pay | Admitting: Orthopaedic Surgery

## 2015-12-23 VITALS — BP 137/82 | HR 71 | Temp 97.5°F | Ht 68.0 in | Wt 300.0 lb

## 2015-12-23 DIAGNOSIS — R1310 Dysphagia, unspecified: Secondary | ICD-10-CM

## 2015-12-23 DIAGNOSIS — R1319 Other dysphagia: Secondary | ICD-10-CM

## 2015-12-23 DIAGNOSIS — M25562 Pain in left knee: Secondary | ICD-10-CM | POA: Diagnosis not present

## 2015-12-23 MED ORDER — OXYCODONE-ACETAMINOPHEN 5-325 MG PO TABS: 1.0000 | ORAL_TABLET | ORAL | 0 refills | Status: DC | PRN

## 2015-12-23 NOTE — Progress Notes (Signed)
CC:  I have pain of my right knee. I would like an injection.  The patient has chronic pain of the right knee.  There is no recent trauma.  There is no redness.  Injections in the past have helped.  The knee has no redness, has an effusion and crepitus present.  ROM of the knee is 0-105.  Impression:  Chronic knee pain right.  Return: 1 month   PROCEDURE NOTE:  The patient requests injections of the right knee , verbal consent was obtained.  The right knee was prepped appropriately after time out was performed.   Sterile technique was observed and injection of 1 cc of Depo-Medrol 40 mg with several cc's of plain xylocaine. Anesthesia was provided by ethyl chloride and a 20-gauge needle was used to inject the knee area. The injection was tolerated well.  A band aid dressing was applied.  The patient was advised to apply ice later today and tomorrow to the injection sight as needed.  Electronically Signed Sanjuana Kava, MD 8/15/20172:11 PM

## 2016-01-21 ENCOUNTER — Ambulatory Visit (HOSPITAL_COMMUNITY)
Admission: RE | Admit: 2016-01-21 | Discharge: 2016-01-21 | Disposition: A | Discharge status: Home / Self Care | Payer: Medicare Other | Source: Ambulatory Visit | Attending: Family Medicine | Admitting: Family Medicine

## 2016-01-21 DIAGNOSIS — Z1231 Encounter for screening mammogram for malignant neoplasm of breast: Secondary | ICD-10-CM | POA: Insufficient documentation

## 2016-01-27 ENCOUNTER — Ambulatory Visit (INDEPENDENT_AMBULATORY_CARE_PROVIDER_SITE_OTHER): Payer: Medicare Other | Admitting: Psychiatry

## 2016-01-27 ENCOUNTER — Encounter (HOSPITAL_COMMUNITY): Payer: Self-pay | Admitting: Psychiatry

## 2016-01-27 VITALS — BP 143/72 | HR 82 | Ht 68.0 in | Wt 302.4 lb

## 2016-01-27 DIAGNOSIS — F1021 Alcohol dependence, in remission: Secondary | ICD-10-CM

## 2016-01-27 DIAGNOSIS — F3162 Bipolar disorder, current episode mixed, moderate: Secondary | ICD-10-CM | POA: Diagnosis not present

## 2016-01-27 MED ORDER — ESCITALOPRAM OXALATE 20 MG PO TABS: 20.0000 mg | ORAL_TABLET | Freq: Every day | ORAL | 2 refills | Status: DC

## 2016-01-27 MED ORDER — ALPRAZOLAM 1 MG PO TABS: 1.0000 mg | ORAL_TABLET | Freq: Two times a day (BID) | ORAL | 2 refills | Status: DC | PRN

## 2016-01-27 MED ORDER — TRAZODONE HCL 50 MG PO TABS: 50.0000 mg | ORAL_TABLET | Freq: Every day | ORAL | 2 refills | Status: DC

## 2016-01-27 MED ORDER — CARBAMAZEPINE ER 200 MG PO CP12: 200.0000 mg | ORAL_CAPSULE | Freq: Every day | ORAL | 2 refills | Status: DC
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
## 2016-01-28 ENCOUNTER — Ambulatory Visit: Payer: Self-pay | Admitting: Orthopaedic Surgery

## 2016-01-29 ENCOUNTER — Telehealth: Payer: Self-pay

## 2016-01-29 NOTE — Telephone Encounter (Signed)
LM to bring in CPAP or SD card to appt. Left time and date.

## 2016-02-02 ENCOUNTER — Ambulatory Visit: Payer: Self-pay | Admitting: Neurology

## 2016-02-02 ENCOUNTER — Telehealth: Payer: Self-pay

## 2016-02-02 NOTE — Telephone Encounter (Signed)
Patient showed up late to appt. NPs unable to see patient as well. Another appt was offered to patient, but she left.

## 2016-02-04 ENCOUNTER — Ambulatory Visit (INDEPENDENT_AMBULATORY_CARE_PROVIDER_SITE_OTHER): Payer: Medicare Other | Admitting: Orthopaedic Surgery

## 2016-02-04 ENCOUNTER — Encounter: Payer: Self-pay | Admitting: Orthopaedic Surgery

## 2016-02-04 VITALS — BP 145/87 | HR 67 | Temp 97.5°F | Resp 18 | Ht 65.0 in | Wt 300.0 lb

## 2016-02-04 DIAGNOSIS — M25561 Pain in right knee: Secondary | ICD-10-CM | POA: Diagnosis not present

## 2016-02-04 DIAGNOSIS — M25562 Pain in left knee: Secondary | ICD-10-CM

## 2016-02-04 MED ORDER — OXYCODONE-ACETAMINOPHEN 5-325 MG PO TABS: 1.0000 | ORAL_TABLET | Freq: Four times a day (QID) | ORAL | 0 refills | Status: DC | PRN
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
## 2016-02-09 ENCOUNTER — Telehealth: Payer: Self-pay | Admitting: Adult Health

## 2016-02-09 NOTE — Telephone Encounter (Signed)
I called the patient. She filed a complaint after our office could not see her when she arrived 18 minutes late for appointment. She was concerned that when she called in to ask for directions no one informed her that if she was 10 minutes late she would not be seen. I apologized that she was not informed. I did explain that we ask patients to arrived 15 minutes prior to their appointment to prevent this. She voiced understanding. The patient was also worried that we wouldn't fill her insurance she checked and is on the forms. I advised her that her insurance would not be billed there was no office visit. She billing manager Angie will also reassured her of this. The patient states that she has followed up with her DME company and they can check compliance downloads. She voices that she will no longer follow up with our office.

## 2016-03-03 ENCOUNTER — Ambulatory Visit: Payer: Self-pay | Admitting: Orthopaedic Surgery

## 2016-03-18 ENCOUNTER — Encounter: Payer: Self-pay | Admitting: Orthopaedic Surgery

## 2016-03-18 ENCOUNTER — Ambulatory Visit (INDEPENDENT_AMBULATORY_CARE_PROVIDER_SITE_OTHER): Payer: Medicare Other | Admitting: Orthopaedic Surgery

## 2016-03-18 DIAGNOSIS — M25562 Pain in left knee: Secondary | ICD-10-CM

## 2016-03-18 DIAGNOSIS — G8929 Other chronic pain: Secondary | ICD-10-CM

## 2016-03-18 DIAGNOSIS — M25561 Pain in right knee: Secondary | ICD-10-CM | POA: Diagnosis not present

## 2016-03-18 NOTE — Progress Notes (Signed)
CC: Both of my knees are hurting. I would like an injection in both knees.  The patient has had chronic pain and tenderness of both knees for some time.  Injections help.  There is no locking or giving way of the knee.  There is no new trauma. There is no redness or signs of infections.  The knees have a mild effusion and some crepitus.  There is no redness or signs of recent trauma.  Right knee ROM is 0-100 and left knee ROM is 0-95.  Impression:  Chronic pain of the both knees  Return:  1 month  PROCEDURE NOTE:  The patient requests injections of both knees, verbal consent was obtained.  The left and right knee were individually prepped appropriately after time out was performed.   Sterile technique was observed and injection of 1 cc of Depo-Medrol 40 mg with several cc's of plain xylocaine. Anesthesia was provided by ethyl chloride and a 20-gauge needle was used to inject each knee area. The injections were tolerated well.  A band aid dressing was applied.  The patient was advised to apply ice later today and tomorrow to the injection sight as needed.  Electronically Signed Sanjuana Kava, MD 11/9/20171:51 PM

## 2016-03-22 ENCOUNTER — Ambulatory Visit (INDEPENDENT_AMBULATORY_CARE_PROVIDER_SITE_OTHER): Payer: Medicare Other | Admitting: Gastroenterology

## 2016-03-22 ENCOUNTER — Other Ambulatory Visit (HOSPITAL_COMMUNITY)
Admission: RE | Admit: 2016-03-22 | Discharge: 2016-03-22 | Disposition: A | Discharge status: Home / Self Care | Payer: Medicare Other | Source: Ambulatory Visit | Attending: Gastroenterology | Admitting: Gastroenterology

## 2016-03-22 ENCOUNTER — Ambulatory Visit (HOSPITAL_COMMUNITY)
Admission: RE | Admit: 2016-03-22 | Discharge: 2016-03-22 | Disposition: A | Discharge status: Home / Self Care | Payer: Medicare Other | Source: Ambulatory Visit | Attending: Gastroenterology | Admitting: Gastroenterology

## 2016-03-22 ENCOUNTER — Encounter: Payer: Self-pay | Admitting: Gastroenterology

## 2016-03-22 VITALS — BP 185/87 | HR 87 | Temp 97.3°F | Ht 66.0 in | Wt 297.2 lb

## 2016-03-22 DIAGNOSIS — Z9889 Other specified postprocedural states: Secondary | ICD-10-CM | POA: Insufficient documentation

## 2016-03-22 DIAGNOSIS — R1032 Left lower quadrant pain: Secondary | ICD-10-CM | POA: Insufficient documentation

## 2016-03-22 DIAGNOSIS — K5730 Diverticulosis of large intestine without perforation or abscess without bleeding: Secondary | ICD-10-CM | POA: Insufficient documentation

## 2016-03-22 DIAGNOSIS — K439 Ventral hernia without obstruction or gangrene: Secondary | ICD-10-CM | POA: Insufficient documentation

## 2016-03-22 LAB — BASIC METABOLIC PANEL
ANION GAP: 7 (ref 5–15)
BUN: 21 mg/dL — ABNORMAL HIGH (ref 6–20)
CALCIUM: 9.4 mg/dL (ref 8.9–10.3)
CO2: 30 mmol/L (ref 22–32)
CREATININE: 0.74 mg/dL (ref 0.44–1.00)
Chloride: 102 mmol/L (ref 101–111)
GFR calc non Af Amer: >60 mL/min (ref >60)
Glucose, Bld: 171 mg/dL — ABNORMAL HIGH (ref 65–99)
Potassium: 4 mmol/L (ref 3.5–5.1)
SODIUM: 139 mmol/L (ref 135–145)

## 2016-03-22 LAB — CBC WITH DIFFERENTIAL/PLATELET
BASOS ABS: 0 10*3/uL (ref 0.0–0.1)
BASOS PCT: 0 %
EOS ABS: 0.3 10*3/uL (ref 0.0–0.7)
Eosinophils Relative: 3 %
HCT: 46.6 % — ABNORMAL HIGH (ref 36.0–46.0)
HEMOGLOBIN: 15.4 g/dL — AB (ref 12.0–15.0)
Lymphocytes Relative: 25 %
Lymphs Abs: 2.6 10*3/uL (ref 0.7–4.0)
MCH: 29.7 pg (ref 26.0–34.0)
MCHC: 33 g/dL (ref 30.0–36.0)
MCV: 90 fL (ref 78.0–100.0)
Monocytes Absolute: 0.8 10*3/uL (ref 0.1–1.0)
Monocytes Relative: 7 %
NEUTROS PCT: 65 %
Neutro Abs: 6.9 10*3/uL (ref 1.7–7.7)
Platelets: 231 10*3/uL (ref 150–400)
RBC: 5.18 MIL/uL — AB (ref 3.87–5.11)
RDW: 13.3 % (ref 11.5–15.5)
WBC: 10.6 10*3/uL — AB (ref 4.0–10.5)

## 2016-03-22 MED ORDER — IOPAMIDOL (ISOVUE-300) INJECTION 61%
100.0000 mL | Freq: Once | INTRAVENOUS | Status: AC | PRN
  Administered 2016-03-22: 100 mL via INTRAVENOUS

## 2016-03-22 NOTE — Progress Notes (Signed)
Please let patient know that we forgot to address her BP at today's visit. Top number not usually that high. Possibly related to pain.  If she has a way to check her BP at home, she should check it later today and make sure improved. If develops blurred vision, headache, she should go to ER. If BP remains elevated she needs to discuss management with PCP.

## 2016-03-22 NOTE — Progress Notes (Signed)
Primary Care Physician: Purvis Kilts, MD  Primary Gastroenterologist:  Garfield Cornea, MD   Chief Complaint  Patient presents with  . Abdominal Pain    HPI: [name] is a 55 y.o. female here for follow-up. Last seen in 09/2015. H/o complicated diverticulitis requiring surgery in remote. Treated empirically for diverticulitis in 02/2014 and 12/2014. Patient reminds me that her last colonoscopy was 04/2012 at [facility] after failed attempt at colonoscopy locally due to inadequate prep. She took only 1/2 the prep and ate solid food the night before her TCS. For her TCS at [facility], she mixed two bottles of Miralax in 64 ounces of Gatorade. Does not want to go back to [facility] for colonoscopy. H/O lap band but did okay with this prep.  Patient presents complaining of 3-4 week history of left lower quadrant pain. Symptoms now spreading extending into lower mid abdomen as well. No fever. Worse with meals. Saw PCP who prescribed Cipro and Flagyl. She just completed therapy and notes that her symptoms have not improved and actually have worsened. Pain is constant. Stools more frequent with antibiotic use but not loose. No blood in the stool or melena. No vomiting. Currently on a soft diet. Upper GI symptoms like heartburn are well controlled on medication.  Gained 55lb in one year on abilify.  Current Outpatient Prescriptions  Medication Sig Dispense Refill  . ALPRAZolam (XANAX) 1 MG tablet Take 1 tablet (1 mg total) by mouth 2 (two) times daily as needed for anxiety. 180 tablet 2  . Calcium Carb-Cholecalciferol 500-600 MG-UNIT CHEW Chew 1 tablet by mouth 3 (three) times daily.    . carbamazepine (EQUETRO) 200 MG CP12 12 hr capsule Take 1 capsule (200 mg total) by mouth daily. 90 capsule 2  . cycloSPORINE (RESTASIS) 0.05 % ophthalmic emulsion Place 1 drop into both eyes 2 (two) times daily.    Marland Kitchen escitalopram (LEXAPRO) 20 MG tablet Take 1 tablet (20 mg total) by mouth daily. 90 tablet 2  .  exenatide (BYETTA) 10 MCG/0.04ML SOLN Inject 10 mcg into the skin 2 (two) times daily with a meal.    . fluticasone (FLONASE) 50 MCG/ACT nasal spray Place 2 sprays into the nose daily as needed. Allergies    . linaclotide (LINZESS) 290 MCG CAPS capsule Take 1 capsule (290 mcg total) by mouth daily. 90 capsule 3  . lisinopril (PRINIVIL,ZESTRIL) 5 MG tablet Take 5 mg by mouth daily.    Marland Kitchen loratadine (CLARITIN) 10 MG tablet Take 10 mg by mouth daily.    . meclizine (ANTIVERT) 25 MG tablet Take 25 mg by mouth.  2  . meloxicam (MOBIC) 15 MG tablet 15 mg 2 (two) times daily.    . metFORMIN (GLUCOPHAGE) 500 MG tablet Take 500 mg by mouth 2 (two) times daily.    . Multiple Vitamin (MULTIVITAMIN PO) Take 1 tablet by mouth daily.     Marland Kitchen oxyCODONE-acetaminophen (PERCOCET/ROXICET) 5-325 MG tablet Take 1 tablet by mouth every 6 (six) hours as needed for moderate pain or severe pain (Must last 30 days.Do not drive or operate machinery while taking this medicine). 120 tablet 0  . pantoprazole (PROTONIX) 40 MG tablet Take 1 tablet (40 mg total) by mouth 2 (two) times daily. 180 tablet 3  . polyethylene glycol (MIRALAX / GLYCOLAX) packet Take 17 g by mouth daily.    Marland Kitchen Propylene Glycol-Glycerin (SOOTHE) 0.6-0.6 % SOLN Place 1 drop into both eyes 2 (two) times daily.    . traZODone (  DESYREL) 50 MG tablet Take 1 tablet (50 mg total) by mouth at bedtime. 90 tablet 2   No current facility-administered medications for this visit.     Allergies as of 03/22/2016 - Review Complete 03/22/2016  Allergen Reaction Noted  . Bactrim Itching, Nausea And Vomiting, and Other (See Comments) 01/09/2011  . Neurontin [gabapentin] Palpitations and Other (See Comments) 07/21/2012  . Codeine Nausea And Vomiting and Rash    Past Medical History:  Diagnosis Date  . Adenomatous polyp 2009  . Anxiety   . Arthritis   . Bipolar 1 disorder ([place])   . Constipation   . Depression   . Diabetes mellitus   . Diabetes mellitus, type II  ([place])   . Diverticula of colon 2009  . Generalized headaches   . Migraine   . Obesity   . Pelvic floor dysfunction    abnormal anorectal manometry at [facility] in 2009  . Plantar fasciitis   . Psoriasis    Past Surgical History:  Procedure Laterality Date  . ABDOMINAL HYSTERECTOMY    . COLON RESECTION    03/30/2002   with end-colostomy and Hartmann's pouch  . COLON SURGERY     complicated diverticulitis requiring sigmoid resection with colostomy and subsequent takedown  . COLONOSCOPY  12/11/2007   Dr. Gala Romney- marginal prep, normal rectum pancolonic diverticula, adenomatous polyp  . COLONOSCOPY  08/28/2003      Wide open colonic anastomosis/Scattered diverticula noted throughout colon/ Small external hemorrhoids  . COLONOSCOPY  04/2012   [facility]: hyperplastic polyps, diverticulosis, ileocolonic anastomosis.  . COLOSTOMY CLOSURE  07/10/2002  . FLEXIBLE SIGMOIDOSCOPY  01/20/2012   RMR: incomplete/attempted colonoscopy. Inadequate prep precluded examination  . HEEL SPUR SURGERY  09/11/13  . KNEE ARTHROSCOPY  02/04/2004    left knee/partial medial meniscectomy.  Marland Kitchen KNEE SURGERY     3 arthroscopic   . LAPAROSCOPIC GASTRIC BANDING  2009  . LAPAROSCOPIC SALPINGOOPHERECTOMY  03/30/2002  . TUBAL LIGATION     Family History  Problem Relation Age of Onset  . Ovarian cancer Mother   . Heart disease Mother   . Anxiety disorder Mother   . Cirrhosis Father     deceased age 66  . Alcohol abuse Father   . Liver cancer Cousin     age 45, deceased  . Drug abuse Cousin   . ADD / ADHD Son   . Anxiety disorder Sister   . Dementia Maternal Grandfather   . Colon cancer Other     aunt, deceased age 28  . Breast cancer Other     aunt, deceased age 53  . Bipolar disorder Neg Hx   . Depression Neg Hx   . OCD Neg Hx   . Paranoid behavior Neg Hx   . Schizophrenia Neg Hx   . Seizures Neg Hx   . Sexual abuse Neg Hx   . Physical abuse Neg Hx    Social History   Social History  . Marital status:  Married    Spouse name: N/A  . Number of children: 2  . Years of education: college   Occupational History  . student at [employer]  .  Disabled   Social History Main Topics  . Smoking status: Never Smoker  . Smokeless tobacco: Never Used  . Alcohol use No     Comment: occasionally  . Drug use: No  . Sexual activity: Not Currently   Other Topics Concern  . None   Social History Narrative  . None  ROS:  General: Negative for anorexia, weight loss, fever, chills, fatigue, weakness. ENT: Negative for hoarseness, difficulty swallowing , nasal congestion. CV: Negative for chest pain, angina, palpitations, dyspnea on exertion, peripheral edema.  Respiratory: Negative for dyspnea at rest, dyspnea on exertion, cough, sputum, wheezing.  GI: See history of present illness. GU:  Negative for dysuria, hematuria, urinary incontinence, urinary frequency, nocturnal urination.  Endo: Negative for unusual weight change.    Physical Examination:   BP (!) 185/87   Pulse 87   Temp 97.3 F (36.3 C) (Oral)   Ht 5\' 6"  (1.676 m)   Wt 297 lb 3.2 oz (134.8 kg)   BMI 47.97 kg/m   General: Well-nourished, well-developed in no acute distress.  Eyes: No icterus. Mouth: Oropharyngeal mucosa moist and pink , no lesions erythema or exudate. Lungs: Clear to auscultation bilaterally.  Heart: Regular rate and rhythm, no murmurs rubs or gallops.  Abdomen: Bowel sounds are normal, obese but soft, no hepatosplenomegaly or masses, no abdominal bruits or hernia , no rebound or guarding.  Moderate left lower quadrant tenderness. Extremities: No lower extremity edema. No clubbing or deformities. Neuro: Alert and oriented x 4   Skin: Warm and dry, no jaundice.   Psych: Alert and cooperative, normal mood and affect.

## 2016-03-22 NOTE — Progress Notes (Signed)
Pt is aware. She has a BP cuff at home and will check it this evening.

## 2016-03-22 NOTE — Patient Instructions (Signed)
1. Please have your labs done today. 2. CT scan as scheduled.  3. Consume soft foods only for now until we get your results back.

## 2016-03-22 NOTE — Assessment & Plan Note (Signed)
55 year old female with history of complicated diverticulitis requiring surgery in the remote past, has been treated empirically for diverticulitis in 2015 and 2016. She presents today stating that she's had left lower quadrant pain for 4 weeks. Just completed antibiotics prescribed by PCP. Symptoms are worsening. At this point she needs reimaging. Plan for CT abdomen pelvis today. Based on findings she may need to have another course of antibiotics. She requests Diflucan if she does because of potential for vaginal yeast infection which she is prone to. If CT unremarkable, may consider updating colonoscopy as a next step. Further recommend relation to follow.

## 2016-03-23 ENCOUNTER — Other Ambulatory Visit: Payer: Self-pay

## 2016-03-23 ENCOUNTER — Telehealth: Payer: Self-pay | Admitting: General Practice

## 2016-03-23 DIAGNOSIS — D72829 Elevated white blood cell count, unspecified: Secondary | ICD-10-CM

## 2016-03-23 NOTE — Telephone Encounter (Signed)
Patient called in wanting to know if [name] reviewed her ct scan of her abd/pelvis that was done on yesterday.  I explained to her that our policy is 5-7 business days to receive results.  She can be reached at [phone].  Routing to [employer]

## 2016-03-23 NOTE — Progress Notes (Signed)
Pt is aware of results and plan. Lab order on file for 4 weeks.

## 2016-03-23 NOTE — Progress Notes (Signed)
No acute findings. Please let patient know that her CT shows NO evidence of diverticulitis. She has stable ventral hernia, present on 2010 CT as well.   Her WBC was slightly elevated as well as her Hgb which could be from mild dehydration. Make sure drink fluids to keep urine light in color.  We will recheck her CBC with diff in four weeks.   I will discuss with Dr. Gala Romney about updating colonoscopy. Further recommendations to follow.

## 2016-03-23 NOTE — Telephone Encounter (Signed)
See result note.  

## 2016-03-23 NOTE — Progress Notes (Signed)
cc'd to pcp 

## 2016-04-07 ENCOUNTER — Other Ambulatory Visit: Payer: Self-pay

## 2016-04-07 DIAGNOSIS — D72829 Elevated white blood cell count, unspecified: Secondary | ICD-10-CM

## 2016-04-07 NOTE — Progress Notes (Signed)
Please let patient know that RMR recommends updating her colonoscopy. If we can get it within 30 day cut off that's fine or she can plan to have done after Christmas with return OV to schedule.   TCS in OR with RMR due to polypharmacy. Day of prep: 1/2 dose metformin and byetta. She will need modified bowel prep which has been approved by RMR.  I will give you prep instructions. Let me know when you have a date picked out.

## 2016-04-12 NOTE — Progress Notes (Signed)
Noted  

## 2016-04-15 ENCOUNTER — Ambulatory Visit: Payer: Self-pay | Admitting: Orthopaedic Surgery

## 2016-04-22 ENCOUNTER — Other Ambulatory Visit: Payer: Self-pay

## 2016-04-22 DIAGNOSIS — Z800 Family history of malignant neoplasm of digestive organs: Secondary | ICD-10-CM

## 2016-04-22 DIAGNOSIS — Z86010 Personal history of colonic polyps: Secondary | ICD-10-CM

## 2016-04-22 LAB — CBC WITH DIFFERENTIAL/PLATELET
BASOS PCT: 1 %
Basophils Absolute: 81 cells/uL (ref 0–200)
EOS ABS: 324 {cells}/uL (ref 15–500)
EOS PCT: 4 %
HCT: 41.5 % (ref 35.0–45.0)
Hemoglobin: 13.7 g/dL (ref 11.7–15.5)
LYMPHS PCT: 28 %
Lymphs Abs: 2268 cells/uL (ref 850–3900)
MCH: 29 pg (ref 27.0–33.0)
MCHC: 33 g/dL (ref 32.0–36.0)
MCV: 87.9 fL (ref 80.0–100.0)
MONOS PCT: 7 %
MPV: 9.7 fL (ref 7.5–12.5)
Monocytes Absolute: 567 cells/uL (ref 200–950)
NEUTROS ABS: 4860 {cells}/uL (ref 1500–7800)
Neutrophils Relative %: 60 %
PLATELETS: 210 10*3/uL (ref 140–400)
RBC: 4.72 MIL/uL (ref 3.80–5.10)
RDW: 13.9 % (ref 11.0–15.0)
WBC: 8.1 10*3/uL (ref 3.8–10.8)

## 2016-04-25 NOTE — Progress Notes (Signed)
Please let patient know her CBC is now normal. TCS as planned.

## 2016-04-27 ENCOUNTER — Encounter (HOSPITAL_COMMUNITY): Payer: Self-pay | Admitting: Psychiatry

## 2016-04-27 ENCOUNTER — Ambulatory Visit (INDEPENDENT_AMBULATORY_CARE_PROVIDER_SITE_OTHER): Payer: Medicare Other | Admitting: Psychiatry

## 2016-04-27 VITALS — BP 145/102 | HR 93 | Ht 66.0 in | Wt 306.0 lb

## 2016-04-27 DIAGNOSIS — Z8249 Family history of ischemic heart disease and other diseases of the circulatory system: Secondary | ICD-10-CM

## 2016-04-27 DIAGNOSIS — Z9889 Other specified postprocedural states: Secondary | ICD-10-CM | POA: Diagnosis not present

## 2016-04-27 DIAGNOSIS — Z811 Family history of alcohol abuse and dependence: Secondary | ICD-10-CM

## 2016-04-27 DIAGNOSIS — Z818 Family history of other mental and behavioral disorders: Secondary | ICD-10-CM

## 2016-04-27 DIAGNOSIS — F3162 Bipolar disorder, current episode mixed, moderate: Secondary | ICD-10-CM | POA: Diagnosis not present

## 2016-04-27 DIAGNOSIS — Z803 Family history of malignant neoplasm of breast: Secondary | ICD-10-CM

## 2016-04-27 DIAGNOSIS — Z888 Allergy status to other drugs, medicaments and biological substances status: Secondary | ICD-10-CM | POA: Diagnosis not present

## 2016-04-27 DIAGNOSIS — Z813 Family history of other psychoactive substance abuse and dependence: Secondary | ICD-10-CM

## 2016-04-27 DIAGNOSIS — Z79899 Other long term (current) drug therapy: Secondary | ICD-10-CM

## 2016-04-27 DIAGNOSIS — Z8041 Family history of malignant neoplasm of ovary: Secondary | ICD-10-CM

## 2016-04-27 MED ORDER — ESCITALOPRAM OXALATE 20 MG PO TABS: 20.0000 mg | ORAL_TABLET | Freq: Every day | ORAL | 2 refills | Status: DC

## 2016-04-27 MED ORDER — ALPRAZOLAM 1 MG PO TABS: 1.0000 mg | ORAL_TABLET | Freq: Two times a day (BID) | ORAL | 2 refills | Status: DC | PRN

## 2016-04-27 MED ORDER — TRAZODONE HCL 50 MG PO TABS: 50.0000 mg | ORAL_TABLET | Freq: Every day | ORAL | 2 refills | Status: DC

## 2016-04-27 MED ORDER — CARBAMAZEPINE ER 200 MG PO CP12: 200.0000 mg | ORAL_CAPSULE | Freq: Every day | ORAL | 2 refills | Status: DC
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
## 2016-05-18 ENCOUNTER — Ambulatory Visit: Payer: Self-pay | Admitting: Orthopaedic Surgery

## 2016-05-20 ENCOUNTER — Ambulatory Visit: Payer: Self-pay | Admitting: Orthopaedic Surgery

## 2016-05-27 ENCOUNTER — Ambulatory Visit: Payer: Self-pay | Admitting: Orthopaedic Surgery

## 2016-05-28 ENCOUNTER — Encounter: Payer: Self-pay | Admitting: Orthopaedic Surgery

## 2016-05-28 ENCOUNTER — Ambulatory Visit (INDEPENDENT_AMBULATORY_CARE_PROVIDER_SITE_OTHER): Payer: Medicare Other | Admitting: Orthopaedic Surgery

## 2016-05-28 DIAGNOSIS — M25562 Pain in left knee: Secondary | ICD-10-CM

## 2016-05-28 DIAGNOSIS — M25561 Pain in right knee: Secondary | ICD-10-CM | POA: Diagnosis not present

## 2016-05-28 DIAGNOSIS — G8929 Other chronic pain: Secondary | ICD-10-CM

## 2016-05-28 MED ORDER — OXYCODONE-ACETAMINOPHEN 5-325 MG PO TABS: 1.0000 | ORAL_TABLET | Freq: Four times a day (QID) | ORAL | 0 refills | Status: DC | PRN

## 2016-05-28 MED ORDER — MELOXICAM 15 MG PO TABS: 15.0000 mg | ORAL_TABLET | Freq: Every day | ORAL | 5 refills | Status: DC

## 2016-05-28 NOTE — Progress Notes (Signed)
CC: Both of my knees are hurting. I would like an injection in both knees.  The patient has had chronic pain and tenderness of both knees for some time.  Injections help.  There is no locking or giving way of the knee.  There is no new trauma. There is no redness or signs of infections.  The knees have a mild effusion and some crepitus.  There is no redness or signs of recent trauma.  Right knee ROM is 0-105 and left knee ROM is 0-100.  Impression:  Chronic pain of the both knees  Return:  1 month  PROCEDURE NOTE:  The patient requests injections of both knees, verbal consent was obtained.  The left and right knee were individually prepped appropriately after time out was performed.   Sterile technique was observed and injection of 1 cc of Depo-Medrol 40 mg with several cc's of plain xylocaine. Anesthesia was provided by ethyl chloride and a 20-gauge needle was used to inject each knee area. The injections were tolerated well.  A band aid dressing was applied.  The patient was advised to apply ice later today and tomorrow to the injection sight as needed.  I have given pain medicine.  I checked the state site first for narcotic use.  Electronically Signed Sanjuana Kava, MD 1/19/20189:23 AM

## 2016-06-01 NOTE — Patient Instructions (Signed)
METZTLI FAIRFAX  06/01/2016     @PREFPERIOPPHARMACY @   Your procedure is scheduled on 06/07/2016   Report to [facility] at  35 A.M.  Call this number if you have problems the morning of surgery:  [phone]   Remember:  Do not eat food or drink liquids after midnight.  Take these medicines the morning of surgery with A SIP OF WATER  Xanax, equetro, lexapro, lisinopril, claritin, antivert, mobic, oxycodone, protonix.   Do not wear jewelry, make-up or nail polish.  Do not wear lotions, powders, or perfumes, or deoderant.  Do not shave 48 hours prior to surgery.  Men may shave face and neck.  Do not bring valuables to the hospital.  [facility] is not responsible for any belongings or valuables.  Contacts, dentures or bridgework may not be worn into surgery.  Leave your suitcase in the car.  After surgery it may be brought to your room.  For patients admitted to the hospital, discharge time will be determined by your treatment team.  Patients discharged the day of surgery will not be allowed to drive home.   Name and phone number of your driver:  family Special instructions:  Follow the diet and prep instructions given to you by Dr Roseanne Kaufman office.  Please read over the following fact sheets that you were given. Anesthesia Post-op Instructions and Care and Recovery After Surgery       Colonoscopy, Adult A colonoscopy is an exam to look at the entire large intestine. During the exam, a lubricated, bendable tube is inserted into the anus and then passed into the rectum, colon, and other parts of the large intestine. A colonoscopy is often done as a part of normal colorectal screening or in response to certain symptoms, such as anemia, persistent diarrhea, abdominal pain, and blood in the stool. The exam can help screen for and diagnose medical problems, including:  Tumors.  Polyps.  Inflammation.  Areas of bleeding. Tell a health care provider  about:  Any allergies you have.  All medicines you are taking, including vitamins, herbs, eye drops, creams, and over-the-counter medicines.  Any problems you or family members have had with anesthetic medicines.  Any blood disorders you have.  Any surgeries you have had.  Any medical conditions you have.  Any problems you have had passing stool. What are the risks? Generally, this is a safe procedure. However, problems may occur, including:  Bleeding.  A tear in the intestine.  A reaction to medicines given during the exam.  Infection (rare). What happens before the procedure? Eating and drinking restrictions  Follow instructions from your health care provider about eating and drinking, which may include:  A few days before the procedure - follow a low-fiber diet. Avoid nuts, seeds, dried fruit, raw fruits, and vegetables.  1-3 days before the procedure - follow a clear liquid diet. Drink only clear liquids, such as clear broth or bouillon, black coffee or tea, clear juice, clear soft drinks or sports drinks, gelatin desert, and popsicles. Avoid any liquids that contain red or purple dye.  On the day of the procedure - do not eat or drink anything during the 2 hours before the procedure, or within the time period that your health care provider recommends. Bowel prep  If you were prescribed an oral bowel prep to clean out your colon:  Take it as told by your health care provider. Starting the day before  your procedure, you will need to drink a large amount of medicated liquid. The liquid will cause you to have multiple loose stools until your stool is almost clear or light green.  If your skin or anus gets irritated from diarrhea, you may use these to relieve the irritation:  Medicated wipes, such as adult wet wipes with aloe and vitamin E.  A skin soothing-product like petroleum jelly.  If you vomit while drinking the bowel prep, take a break for up to 60 minutes and  then begin the bowel prep again. If vomiting continues and you cannot take the bowel prep without vomiting, call your health care provider. General instructions  Ask your health care provider about changing or stopping your regular medicines. This is especially important if you are taking diabetes medicines or blood thinners.  Plan to have someone take you home from the hospital or clinic. What happens during the procedure?  An IV tube may be inserted into one of your veins.  You will be given medicine to help you relax (sedative).  To reduce your risk of infection:  Your health care team will wash or sanitize their hands.  Your anal area will be washed with soap.  You will be asked to lie on your side with your knees bent.  Your health care provider will lubricate a long, thin, flexible tube. The tube will have a camera and a light on the end.  The tube will be inserted into your anus.  The tube will be gently eased through your rectum and colon.  Air will be delivered into your colon to keep it open. You may feel some pressure or cramping.  The camera will be used to take images during the procedure.  A small tissue sample may be removed from your body to be examined under a microscope (biopsy). If any potential problems are found, the tissue will be sent to a lab for testing.  If small polyps are found, your health care provider may remove them and have them checked for cancer cells.  The tube that was inserted into your anus will be slowly removed. The procedure may vary among health care providers and hospitals. What happens after the procedure?  Your blood pressure, heart rate, breathing rate, and blood oxygen level will be monitored until the medicines you were given have worn off.  Do not drive for 24 hours after the exam.  You may have a small amount of blood in your stool.  You may pass gas and have mild abdominal cramping or bloating due to the air that was used  to inflate your colon during the exam.  It is up to you to get the results of your procedure. Ask your health care provider, or the department performing the procedure, when your results will be ready. This information is not intended to replace advice given to you by your health care provider. Make sure you discuss any questions you have with your health care provider. Document Released: 04/23/2000 Document Revised: 11/14/2015 Document Reviewed: 07/08/2015 Elsevier Interactive Patient Education  2017 [employer]  Colonoscopy, Adult, Care After This sheet gives you information about how to care for yourself after your procedure. Your doctor may also give you more specific instructions. If you have problems or questions, call your doctor. Follow these instructions at home: General instructions  For the first 24 hours after the procedure:  Do not drive or use machinery.  Do not sign important documents.  Do not drink alcohol.  Do your daily activities more slowly than normal.  Eat foods that are soft and easy to digest.  Rest often.  Take over-the-counter or prescription medicines only as told by your doctor.  It is up to you to get the results of your procedure. Ask your doctor, or the department performing the procedure, when your results will be ready. To help cramping and bloating:  Try walking around.  Put heat on your belly (abdomen) as told by your doctor. Use a heat source that your doctor recommends, such as a moist heat pack or a heating pad.  Put a towel between your skin and the heat source.  Leave the heat on for 20-30 minutes.  Remove the heat if your skin turns bright red. This is especially important if you cannot feel pain, heat, or cold. You can get burned. Eating and drinking  Drink enough fluid to keep your pee (urine) clear or pale yellow.  Return to your normal diet as told by your doctor. Avoid heavy or fried foods that are hard to digest.  Avoid  drinking alcohol for as long as told by your doctor. Contact a doctor if:  You have blood in your poop (stool) 2-3 days after the procedure. Get help right away if:  You have more than a small amount of blood in your poop.  You see large clumps of tissue (blood clots) in your poop.  Your belly is swollen.  You feel sick to your stomach (nauseous).  You throw up (vomit).  You have a fever.  You have belly pain that gets worse, and medicine does not help your pain. This information is not intended to replace advice given to you by your health care provider. Make sure you discuss any questions you have with your health care provider. Document Released: 05/29/2010 Document Revised: 01/19/2016 Document Reviewed: 01/19/2016 Elsevier Interactive Patient Education  2017 [place] Anesthesia is a term that refers to techniques, procedures, and medicines that help a person stay safe and comfortable during a medical procedure. Monitored anesthesia care, or sedation, is one type of anesthesia. Your anesthesia specialist may recommend sedation if you will be having a procedure that does not require you to be unconscious, such as:  Cataract surgery.  A dental procedure.  A biopsy.  A colonoscopy. During the procedure, you may receive a medicine to help you relax (sedative). There are three levels of sedation:  Mild sedation. At this level, you may feel awake and relaxed. You will be able to follow directions.  Moderate sedation. At this level, you will be sleepy. You may not remember the procedure.  Deep sedation. At this level, you will be asleep. You will not remember the procedure. The more medicine you are given, the deeper your level of sedation will be. Depending on how you respond to the procedure, the anesthesia specialist may change your level of sedation or the type of anesthesia to fit your needs. An anesthesia specialist will monitor you closely  during the procedure. Let your health care provider know about:  Any allergies you have.  All medicines you are taking, including vitamins, herbs, eye drops, creams, and over-the-counter medicines.  Any use of steroids (by mouth or as a cream).  Any problems you or family members have had with sedatives and anesthetic medicines.  Any blood disorders you have.  Any surgeries you have had.  Any medical conditions you have, such as sleep apnea.  Whether you are pregnant or  may be pregnant.  Any use of cigarettes, alcohol, or street drugs. What are the risks? Generally, this is a safe procedure. However, problems may occur, including:  Getting too much medicine (oversedation).  Nausea.  Allergic reaction to medicines.  Trouble breathing. If this happens, a breathing tube may be used to help with breathing. It will be removed when you are awake and breathing on your own.  Heart trouble.  Lung trouble. Before the procedure Staying hydrated  Follow instructions from your health care provider about hydration, which may include:  Up to 2 hours before the procedure - you may continue to drink clear liquids, such as water, clear fruit juice, black coffee, and plain tea. Eating and drinking restrictions  Follow instructions from your health care provider about eating and drinking, which may include:  8 hours before the procedure - stop eating heavy meals or foods such as meat, fried foods, or fatty foods.  6 hours before the procedure - stop eating light meals or foods, such as toast or cereal.  6 hours before the procedure - stop drinking milk or drinks that contain milk.  2 hours before the procedure - stop drinking clear liquids. Medicines  Ask your health care provider about:  Changing or stopping your regular medicines. This is especially important if you are taking diabetes medicines or blood thinners.  Taking medicines such as aspirin and ibuprofen. These medicines  can thin your blood. Do not take these medicines before your procedure if your health care provider instructs you not to. Tests and exams  You will have a physical exam.  You may have blood tests done to show:  How well your kidneys and liver are working.  How well your blood can clot.  General instructions  Plan to have someone take you home from the hospital or clinic.  If you will be going home right after the procedure, plan to have someone with you for 24 hours. What happens during the procedure?  Your blood pressure, heart rate, breathing, level of pain and overall condition will be monitored.  An IV tube will be inserted into one of your veins.  Your anesthesia specialist will give you medicines as needed to keep you comfortable during the procedure. This may mean changing the level of sedation.  The procedure will be performed. After the procedure  Your blood pressure, heart rate, breathing rate, and blood oxygen level will be monitored until the medicines you were given have worn off.  Do not drive for 24 hours if you received a sedative.  You may:  Feel sleepy, clumsy, or nauseous.  Feel forgetful about what happened after the procedure.  Have a sore throat if you had a breathing tube during the procedure.  Vomit. This information is not intended to replace advice given to you by your health care provider. Make sure you discuss any questions you have with your health care provider. Document Released: 01/20/2005 Document Revised: 10/03/2015 Document Reviewed: 08/17/2015 Elsevier Interactive Patient Education  2017 [place], Care After These instructions provide you with information about caring for yourself after your procedure. Your health care provider may also give you more specific instructions. Your treatment has been planned according to current medical practices, but problems sometimes occur. Call your health care provider if  you have any problems or questions after your procedure. What can I expect after the procedure? After your procedure, it is common to:  Feel sleepy for several hours.  Feel clumsy  and have poor balance for several hours.  Feel forgetful about what happened after the procedure.  Have poor judgment for several hours.  Feel nauseous or vomit.  Have a sore throat if you had a breathing tube during the procedure. Follow these instructions at home: For at least 24 hours after the procedure:   Do not:  Participate in activities in which you could fall or become injured.  Drive.  Use heavy machinery.  Drink alcohol.  Take sleeping pills or medicines that cause drowsiness.  Make important decisions or sign legal documents.  Take care of children on your own.  Rest. Eating and drinking  Follow the diet that is recommended by your health care provider.  If you vomit, drink water, juice, or soup when you can drink without vomiting.  Make sure you have little or no nausea before eating solid foods. General instructions  Have a responsible adult stay with you until you are awake and alert.  Take over-the-counter and prescription medicines only as told by your health care provider.  If you smoke, do not smoke without supervision.  Keep all follow-up visits as told by your health care provider. This is important. Contact a health care provider if:  You keep feeling nauseous or you keep vomiting.  You feel light-headed.  You develop a rash.  You have a fever. Get help right away if:  You have trouble breathing. This information is not intended to replace advice given to you by your health care provider. Make sure you discuss any questions you have with your health care provider. Document Released: 08/17/2015 Document Revised: 12/17/2015 Document Reviewed: 08/17/2015 Elsevier Interactive Patient Education  2017 [employer].

## 2016-06-04 ENCOUNTER — Other Ambulatory Visit: Payer: Self-pay

## 2016-06-04 ENCOUNTER — Ambulatory Visit (INDEPENDENT_AMBULATORY_CARE_PROVIDER_SITE_OTHER): Payer: Medicare Other | Admitting: Gastroenterology

## 2016-06-04 ENCOUNTER — Encounter (HOSPITAL_COMMUNITY): Payer: Self-pay

## 2016-06-04 ENCOUNTER — Encounter (HOSPITAL_COMMUNITY)
Admission: RE | Admit: 2016-06-04 | Discharge: 2016-06-04 | Disposition: A | Discharge status: Home / Self Care | Payer: Medicare Other | Source: Ambulatory Visit | Attending: Internal Medicine | Admitting: Internal Medicine

## 2016-06-04 VITALS — BP 122/77 | HR 66 | Temp 97.5°F | Ht 66.5 in | Wt 299.4 lb

## 2016-06-04 DIAGNOSIS — K439 Ventral hernia without obstruction or gangrene: Secondary | ICD-10-CM | POA: Insufficient documentation

## 2016-06-04 DIAGNOSIS — Z8041 Family history of malignant neoplasm of ovary: Secondary | ICD-10-CM

## 2016-06-04 DIAGNOSIS — R51 Headache: Secondary | ICD-10-CM | POA: Diagnosis not present

## 2016-06-04 DIAGNOSIS — G4733 Obstructive sleep apnea (adult) (pediatric): Secondary | ICD-10-CM | POA: Insufficient documentation

## 2016-06-04 DIAGNOSIS — K219 Gastro-esophageal reflux disease without esophagitis: Secondary | ICD-10-CM | POA: Diagnosis not present

## 2016-06-04 DIAGNOSIS — Z90710 Acquired absence of both cervix and uterus: Secondary | ICD-10-CM | POA: Insufficient documentation

## 2016-06-04 DIAGNOSIS — R1032 Left lower quadrant pain: Secondary | ICD-10-CM | POA: Diagnosis not present

## 2016-06-04 DIAGNOSIS — L409 Psoriasis, unspecified: Secondary | ICD-10-CM | POA: Diagnosis not present

## 2016-06-04 DIAGNOSIS — E669 Obesity, unspecified: Secondary | ICD-10-CM

## 2016-06-04 DIAGNOSIS — G4730 Sleep apnea, unspecified: Secondary | ICD-10-CM | POA: Diagnosis not present

## 2016-06-04 DIAGNOSIS — K5792 Diverticulitis of intestine, part unspecified, without perforation or abscess without bleeding: Secondary | ICD-10-CM | POA: Insufficient documentation

## 2016-06-04 DIAGNOSIS — Z800 Family history of malignant neoplasm of digestive organs: Secondary | ICD-10-CM | POA: Insufficient documentation

## 2016-06-04 DIAGNOSIS — Z8249 Family history of ischemic heart disease and other diseases of the circulatory system: Secondary | ICD-10-CM | POA: Insufficient documentation

## 2016-06-04 DIAGNOSIS — Z79899 Other long term (current) drug therapy: Secondary | ICD-10-CM

## 2016-06-04 DIAGNOSIS — F319 Bipolar disorder, unspecified: Secondary | ICD-10-CM | POA: Diagnosis not present

## 2016-06-04 DIAGNOSIS — Z811 Family history of alcohol abuse and dependence: Secondary | ICD-10-CM | POA: Insufficient documentation

## 2016-06-04 DIAGNOSIS — E119 Type 2 diabetes mellitus without complications: Secondary | ICD-10-CM | POA: Insufficient documentation

## 2016-06-04 DIAGNOSIS — Z888 Allergy status to other drugs, medicaments and biological substances status: Secondary | ICD-10-CM | POA: Insufficient documentation

## 2016-06-04 DIAGNOSIS — F418 Other specified anxiety disorders: Secondary | ICD-10-CM

## 2016-06-04 DIAGNOSIS — Z803 Family history of malignant neoplasm of breast: Secondary | ICD-10-CM | POA: Insufficient documentation

## 2016-06-04 DIAGNOSIS — K5730 Diverticulosis of large intestine without perforation or abscess without bleeding: Secondary | ICD-10-CM | POA: Diagnosis not present

## 2016-06-04 DIAGNOSIS — Z86010 Personal history of colonic polyps: Secondary | ICD-10-CM | POA: Diagnosis not present

## 2016-06-04 DIAGNOSIS — Z980 Intestinal bypass and anastomosis status: Secondary | ICD-10-CM | POA: Diagnosis not present

## 2016-06-04 DIAGNOSIS — Z7984 Long term (current) use of oral hypoglycemic drugs: Secondary | ICD-10-CM | POA: Diagnosis not present

## 2016-06-04 DIAGNOSIS — K5900 Constipation, unspecified: Secondary | ICD-10-CM

## 2016-06-04 DIAGNOSIS — M1990 Unspecified osteoarthritis, unspecified site: Secondary | ICD-10-CM | POA: Diagnosis not present

## 2016-06-04 DIAGNOSIS — F329 Major depressive disorder, single episode, unspecified: Secondary | ICD-10-CM | POA: Insufficient documentation

## 2016-06-04 DIAGNOSIS — Z1211 Encounter for screening for malignant neoplasm of colon: Secondary | ICD-10-CM | POA: Diagnosis not present

## 2016-06-04 DIAGNOSIS — F419 Anxiety disorder, unspecified: Secondary | ICD-10-CM | POA: Diagnosis not present

## 2016-06-04 DIAGNOSIS — Z6841 Body Mass Index (BMI) 40.0 and over, adult: Secondary | ICD-10-CM | POA: Diagnosis not present

## 2016-06-04 DIAGNOSIS — Z9889 Other specified postprocedural states: Secondary | ICD-10-CM

## 2016-06-04 HISTORY — DX: Sleep apnea, unspecified: G47.30

## 2016-06-04 LAB — HCG, SERUM, QUALITATIVE: Preg, Serum: POSITIVE — AB

## 2016-06-04 LAB — CBC
HEMATOCRIT: 47.3 % — AB (ref 36.0–46.0)
Hemoglobin: 15.7 g/dL — ABNORMAL HIGH (ref 12.0–15.0)
MCH: 29.2 pg (ref 26.0–34.0)
MCHC: 33.2 g/dL (ref 30.0–36.0)
MCV: 88.1 fL (ref 78.0–100.0)
Platelets: 227 10*3/uL (ref 150–400)
RBC: 5.37 MIL/uL — ABNORMAL HIGH (ref 3.87–5.11)
RDW: 14.4 % (ref 11.5–15.5)
WBC: 10.2 10*3/uL (ref 4.0–10.5)

## 2016-06-04 LAB — BASIC METABOLIC PANEL
Anion gap: 8 (ref 5–15)
BUN: 24 mg/dL — ABNORMAL HIGH (ref 6–20)
CALCIUM: 9.5 mg/dL (ref 8.9–10.3)
CHLORIDE: 101 mmol/L (ref 101–111)
CO2: 27 mmol/L (ref 22–32)
CREATININE: 0.62 mg/dL (ref 0.44–1.00)
GFR calc Af Amer: >60 mL/min (ref >60)
GFR calc non Af Amer: >60 mL/min (ref >60)
GLUCOSE: 125 mg/dL — AB (ref 65–99)
Potassium: 5 mmol/L (ref 3.5–5.1)
Sodium: 136 mmol/L (ref 135–145)

## 2016-06-04 MED ORDER — SOD PICOSULFATE-MAG OX-CIT ACD 10-3.5-12 MG-GM-GM PO PACK: 1.0000 | PACK | ORAL | 0 refills | Status: DC

## 2016-06-04 NOTE — Progress Notes (Signed)
Primary Care Physician:  Purvis Kilts, MD  Primary Gastroenterologist:  Garfield Cornea, MD   Chief Complaint  Patient presents with  . Colonoscopy    HPI:  [name] is a 56 y.o. female here to schedule colonoscopy. She was last seen in 03/2016. H/o complicated diverticulitis in remote past requiring surgery. Treated empirically for diverticulitis in 02/2014, 12/2014, 03/2016. CT 03/2016 unremarkable except for stable ventral hernia. Last TCS 04/2012 at [facility] after failed attempt locally due to inadequate prep. H/O lap band. Did well with Miralax prep at [facility]. She has had recurrent LLQ pain worse with meals, increased urgency of stools but not loose. Then started having trouble having BM. Now on Linzess and 4 BM per week. Urgency for BM strong with episodes of incontinence. No brbpr or melena. No vomiting. gerd well controlled. Has gained over 55 lb in the past one year on abilify. We offered patient colonoscopy to further evaluate symptoms and she presents to have this scheduled today. At this time she denies LLQ pain.   Current Outpatient Prescriptions  Medication Sig Dispense Refill  . Albiglutide (TANZEUM) 50 MG PEN Inject 50 mg into the skin 2 (two) times daily.    Marland Kitchen ALPRAZolam (XANAX) 1 MG tablet Take 1 tablet (1 mg total) by mouth 2 (two) times daily as needed for anxiety. (Patient taking differently: Take 1 mg by mouth 3 (three) times daily. ) 180 tablet 2  . Calcium Carb-Cholecalciferol 500-600 MG-UNIT CHEW Chew 1 tablet by mouth 3 (three) times daily.    . carbamazepine (EQUETRO) 200 MG CP12 12 hr capsule Take 1 capsule (200 mg total) by mouth daily. 90 capsule 2  . cycloSPORINE (RESTASIS) 0.05 % ophthalmic emulsion Place 1 drop into both eyes 3 (three) times daily.     Marland Kitchen escitalopram (LEXAPRO) 20 MG tablet Take 1 tablet (20 mg total) by mouth daily. 90 tablet 2  . fluticasone (FLONASE) 50 MCG/ACT nasal spray Place 2 sprays into the nose daily as needed. Allergies    .  linaclotide (LINZESS) 290 MCG CAPS capsule Take 1 capsule (290 mcg total) by mouth daily. (Patient taking differently: Take 290 mcg by mouth daily as needed. ) 90 capsule 3  . lisinopril (PRINIVIL,ZESTRIL) 10 MG tablet Take 10 mg by mouth daily.    Marland Kitchen loratadine (CLARITIN) 10 MG tablet Take 10 mg by mouth daily.    . meclizine (ANTIVERT) 25 MG tablet Take 25 mg by mouth daily as needed for dizziness.   2  . meloxicam (MOBIC) 15 MG tablet Take 1 tablet (15 mg total) by mouth daily. 30 tablet 5  . metFORMIN (GLUCOPHAGE) 500 MG tablet Take 500 mg by mouth 2 (two) times daily.    . methocarbamol (ROBAXIN) 500 MG tablet Take 500 mg by mouth 2 (two) times daily as needed for muscle spasms.    . Multiple Vitamin (MULTIVITAMIN PO) Take 2 tablets by mouth daily. ADULT GUMMY VITAMINS    . oxyCODONE-acetaminophen (PERCOCET/ROXICET) 5-325 MG tablet Take 1 tablet by mouth every 6 (six) hours as needed for moderate pain or severe pain (Must last 30 days.Do not drive or operate machinery while taking this medicine). 120 tablet 0  . pantoprazole (PROTONIX) 40 MG tablet Take 1 tablet (40 mg total) by mouth 2 (two) times daily. 180 tablet 3  . polyethylene glycol (MIRALAX / GLYCOLAX) packet Take 17 g by mouth daily as needed for mild constipation.     Marland Kitchen Propylene Glycol-Glycerin (SOOTHE) 0.6-0.6 % SOLN Place 1  drop into both eyes 2 (two) times daily.    . rizatriptan (MAXALT-MLT) 10 MG disintegrating tablet Take 10 mg by mouth as needed for migraine. May repeat in 2 hours if needed    . traZODone (DESYREL) 50 MG tablet Take 1 tablet (50 mg total) by mouth at bedtime. (Patient taking differently: Take 50 mg by mouth at bedtime as needed for sleep. ) 90 tablet 2  . Sod Picosulfate-Mag Ox-Cit Acd 10-3.5-12 MG-GM-GM PACK Take 1 Container by mouth as directed. 1 each 0   No current facility-administered medications for this visit.     Allergies as of 06/04/2016 - Review Complete 06/04/2016  Allergen Reaction Noted  .  Bactrim Itching, Nausea And Vomiting, and Other (See Comments) 01/09/2011  . Neurontin [gabapentin] Palpitations and Other (See Comments) 07/21/2012    Past Medical History:  Diagnosis Date  . Adenomatous polyp 2009  . Anxiety   . Arthritis   . Bipolar 1 disorder ([place])   . Constipation   . Depression   . Diabetes mellitus   . Diabetes mellitus, type II ([place])   . Diverticula of colon 2009  . Generalized headaches   . Migraine   . Obesity   . Pelvic floor dysfunction    abnormal anorectal manometry at [facility] in 2009  . Plantar fasciitis   . Psoriasis   . Sleep apnea     Past Surgical History:  Procedure Laterality Date  . ABDOMINAL HYSTERECTOMY    . COLON RESECTION    03/30/2002   with end-colostomy and Hartmann's pouch  . COLON SURGERY     complicated diverticulitis requiring sigmoid resection with colostomy and subsequent takedown  . COLONOSCOPY  12/11/2007   Dr. Gala Romney- marginal prep, normal rectum pancolonic diverticula, adenomatous polyp  . COLONOSCOPY  08/28/2003      Wide open colonic anastomosis/Scattered diverticula noted throughout colon/ Small external hemorrhoids  . COLONOSCOPY  04/2012   [facility]: hyperplastic polyps, diverticulosis, ileocolonic anastomosis.  . COLOSTOMY CLOSURE  07/10/2002  . FLEXIBLE SIGMOIDOSCOPY  01/20/2012   [facility]: incomplete/attempted colonoscopy. Inadequate prep precluded examination  . HEEL SPUR SURGERY  09/11/13  . KNEE ARTHROSCOPY  02/04/2004    left knee/partial medial meniscectomy.  Marland Kitchen KNEE SURGERY     3 arthroscopic   . LAPAROSCOPIC GASTRIC BANDING  2009  . LAPAROSCOPIC SALPINGOOPHERECTOMY  03/30/2002  . TUBAL LIGATION      Family History  Problem Relation Age of Onset  . Ovarian cancer Mother   . Heart disease Mother   . Anxiety disorder Mother   . Cirrhosis Father     deceased age 34  . Alcohol abuse Father   . Liver cancer Cousin     age 67, deceased  . Drug abuse Cousin   . ADD / ADHD Son   . Anxiety disorder Sister   .  Dementia Maternal Grandfather   . Colon cancer Other     aunt, deceased age 73  . Breast cancer Other     aunt, deceased age 47  . Bipolar disorder Neg Hx   . Depression Neg Hx   . OCD Neg Hx   . Paranoid behavior Neg Hx   . Schizophrenia Neg Hx   . Seizures Neg Hx   . Sexual abuse Neg Hx   . Physical abuse Neg Hx     Social History   Social History  . Marital status: Married    Spouse name: N/A  . Number of children: 2  . Years of education:  college   Occupational History  . student at [employer]  .  Disabled   Social History Main Topics  . Smoking status: Never Smoker  . Smokeless tobacco: Never Used  . Alcohol use No     Comment: occasionally  . Drug use: No  . Sexual activity: Not Currently   Other Topics Concern  . Not on file   Social History Narrative  . No narrative on file      ROS:  General: Negative for anorexia, weight loss, fever, chills, fatigue, weakness. Eyes: Negative for vision changes.  ENT: Negative for hoarseness, difficulty swallowing , nasal congestion. CV: Negative for chest pain, angina, palpitations, dyspnea on exertion, peripheral edema.  Respiratory: Negative for dyspnea at rest, dyspnea on exertion, cough, sputum, wheezing.  GI: See history of present illness. GU:  Negative for dysuria, hematuria, urinary incontinence, urinary frequency, nocturnal urination.  MS: Negative for joint pain, low back pain.  Derm: Negative for rash or itching.  Neuro: Negative for weakness, abnormal sensation, seizure, frequent headaches, memory loss, confusion.  Psych: Negative for anxiety, depression, suicidal ideation, hallucinations.  Endo: Negative for unusual weight change.  Heme: Negative for bruising or bleeding. Allergy: Negative for rash or hives.    Physical Examination:  BP 122/77   Pulse 66   Temp 97.5 F (36.4 C)   Ht 5' 6.5" (1.689 m)   Wt 299 lb 6.4 oz (135.8 kg)   BMI 47.60 kg/m    General: Well-nourished,  well-developed in no acute distress.  Head: Normocephalic, atraumatic.   Eyes: Conjunctiva pink, no icterus. Mouth: Oropharyngeal mucosa moist and pink , no lesions erythema or exudate. Neck: Supple without thyromegaly, masses, or lymphadenopathy.  Lungs: Clear to auscultation bilaterally.  Heart: Regular rate and rhythm, no murmurs rubs or gallops.  Abdomen: Bowel sounds are normal, obese,  nontender, nondistended, no hepatosplenomegaly or masses, no abdominal bruits or    hernia , no rebound or guarding.   Rectal: not performed Extremities: No lower extremity edema. No clubbing or deformities.  Neuro: Alert and oriented x 4 , grossly normal neurologically.  Skin: Warm and dry, no rash or jaundice.   Psych: Alert and cooperative, normal mood and affect.   Imaging Studies: No results found.

## 2016-06-04 NOTE — Patient Instructions (Signed)
1. Colonoscopy as scheduled. Please make sure you follow prep instructions completely. The you must strength all liquids as listed. Make sure you mix the medication according to instructions. 2. If you have any concerns that you are not prepping well, don't hesitate to call on call provider (Dr. Gala Romney) over the weekend [phone].

## 2016-06-06 ENCOUNTER — Encounter: Payer: Self-pay | Admitting: Gastroenterology

## 2016-06-06 NOTE — Assessment & Plan Note (Signed)
56 y/o female with h/o lap Band, complicated diverticulitis requiring surgery in remote past who has been treated empirically multiple times over the past 2 years for ?diverticulitis/LLQ pain. CT 03/2016 without explanation for LLQ pain, she does have ventral hernia. Last TCS 2013 and due this year for h/o adenomatous colon polyps. Discussed with Dr. Gala Romney, we will offer colonoscopy at this time for further evaluation. Deep sedation in OR planned due to polypharmacy. Patient is unable to tolerate large volume prep due to lap band. Modified bowel prep planned, prolonged clear liquids, and Prepopik.  I have discussed the risks, alternatives, benefits with regards to but not limited to the risk of reaction to medication, bleeding, infection, perforation and the patient is agreeable to proceed. Written consent to be obtained.

## 2016-06-07 ENCOUNTER — Ambulatory Visit (HOSPITAL_COMMUNITY)
Admission: RE | Admit: 2016-06-07 | Discharge: 2016-06-07 | Disposition: A | Discharge status: Home / Self Care | Payer: Medicare Other | Source: Ambulatory Visit | Attending: Internal Medicine | Admitting: Internal Medicine

## 2016-06-07 ENCOUNTER — Encounter (HOSPITAL_COMMUNITY): Payer: Self-pay | Admitting: *Deleted

## 2016-06-07 ENCOUNTER — Encounter (HOSPITAL_COMMUNITY)
Admission: RE | Disposition: A | Discharge status: Home / Self Care | Payer: Self-pay | Source: Ambulatory Visit | Attending: Internal Medicine

## 2016-06-07 ENCOUNTER — Ambulatory Visit (HOSPITAL_COMMUNITY): Payer: Medicare Other | Admitting: Anesthesiology

## 2016-06-07 DIAGNOSIS — E119 Type 2 diabetes mellitus without complications: Secondary | ICD-10-CM | POA: Insufficient documentation

## 2016-06-07 DIAGNOSIS — F419 Anxiety disorder, unspecified: Secondary | ICD-10-CM | POA: Insufficient documentation

## 2016-06-07 DIAGNOSIS — K5730 Diverticulosis of large intestine without perforation or abscess without bleeding: Secondary | ICD-10-CM | POA: Diagnosis not present

## 2016-06-07 DIAGNOSIS — Z1211 Encounter for screening for malignant neoplasm of colon: Secondary | ICD-10-CM | POA: Insufficient documentation

## 2016-06-07 DIAGNOSIS — Z79899 Other long term (current) drug therapy: Secondary | ICD-10-CM | POA: Insufficient documentation

## 2016-06-07 DIAGNOSIS — Z980 Intestinal bypass and anastomosis status: Secondary | ICD-10-CM | POA: Insufficient documentation

## 2016-06-07 DIAGNOSIS — M1990 Unspecified osteoarthritis, unspecified site: Secondary | ICD-10-CM | POA: Insufficient documentation

## 2016-06-07 DIAGNOSIS — Z7984 Long term (current) use of oral hypoglycemic drugs: Secondary | ICD-10-CM | POA: Insufficient documentation

## 2016-06-07 DIAGNOSIS — Z800 Family history of malignant neoplasm of digestive organs: Secondary | ICD-10-CM

## 2016-06-07 DIAGNOSIS — E669 Obesity, unspecified: Secondary | ICD-10-CM | POA: Insufficient documentation

## 2016-06-07 DIAGNOSIS — R51 Headache: Secondary | ICD-10-CM | POA: Insufficient documentation

## 2016-06-07 DIAGNOSIS — K219 Gastro-esophageal reflux disease without esophagitis: Secondary | ICD-10-CM | POA: Insufficient documentation

## 2016-06-07 DIAGNOSIS — Z86010 Personal history of colonic polyps: Secondary | ICD-10-CM | POA: Diagnosis not present

## 2016-06-07 DIAGNOSIS — F319 Bipolar disorder, unspecified: Secondary | ICD-10-CM | POA: Insufficient documentation

## 2016-06-07 DIAGNOSIS — Z6841 Body Mass Index (BMI) 40.0 and over, adult: Secondary | ICD-10-CM | POA: Insufficient documentation

## 2016-06-07 DIAGNOSIS — L409 Psoriasis, unspecified: Secondary | ICD-10-CM | POA: Insufficient documentation

## 2016-06-07 DIAGNOSIS — F329 Major depressive disorder, single episode, unspecified: Secondary | ICD-10-CM | POA: Insufficient documentation

## 2016-06-07 DIAGNOSIS — G4730 Sleep apnea, unspecified: Secondary | ICD-10-CM | POA: Insufficient documentation

## 2016-06-07 HISTORY — PX: COLONOSCOPY WITH PROPOFOL: SHX5780

## 2016-06-07 LAB — GLUCOSE, CAPILLARY: Glucose-Capillary: 147 mg/dL — ABNORMAL HIGH (ref 65–99)

## 2016-06-07 SURGERY — COLONOSCOPY WITH PROPOFOL
Anesthesia: Monitor Anesthesia Care

## 2016-06-07 MED ORDER — LACTATED RINGERS IV SOLN
INTRAVENOUS | Status: DC
  Administered 2016-06-07: 07:00:00 via INTRAVENOUS

## 2016-06-07 MED ORDER — MIDAZOLAM HCL 2 MG/2ML IJ SOLN
0.5000 mg | INTRAMUSCULAR | Status: DC | PRN
Start: 2016-06-07 — End: 2016-06-07
  Administered 2016-06-07: 2 mg via INTRAVENOUS

## 2016-06-07 MED ORDER — PROPOFOL 10 MG/ML IV BOLUS
INTRAVENOUS | Status: AC
  Filled 2016-06-07: qty 40

## 2016-06-07 MED ORDER — MIDAZOLAM HCL 2 MG/2ML IJ SOLN
INTRAMUSCULAR | Status: AC
  Filled 2016-06-07: qty 2

## 2016-06-07 MED ORDER — LIDOCAINE HCL (PF) 1 % IJ SOLN
INTRAMUSCULAR | Status: AC
  Filled 2016-06-07: qty 10

## 2016-06-07 MED ORDER — SUCCINYLCHOLINE CHLORIDE 20 MG/ML IJ SOLN
INTRAMUSCULAR | Status: AC
  Filled 2016-06-07: qty 1

## 2016-06-07 MED ORDER — FENTANYL CITRATE (PF) 100 MCG/2ML IJ SOLN
INTRAMUSCULAR | Status: AC
  Filled 2016-06-07: qty 2

## 2016-06-07 MED ORDER — SODIUM CHLORIDE 0.9 % IJ SOLN
INTRAMUSCULAR | Status: AC
  Filled 2016-06-07: qty 10

## 2016-06-07 MED ORDER — PROPOFOL 10 MG/ML IV BOLUS
INTRAVENOUS | Status: DC | PRN
  Administered 2016-06-07: 10 mg via INTRAVENOUS
  Administered 2016-06-07: 20 mg via INTRAVENOUS

## 2016-06-07 MED ORDER — FENTANYL CITRATE (PF) 100 MCG/2ML IJ SOLN
25.0000 ug | INTRAMUSCULAR | Status: AC | PRN
  Administered 2016-06-07 (×2): 25 ug via INTRAVENOUS

## 2016-06-07 MED ORDER — LACTATED RINGERS IV SOLN: INTRAVENOUS | Status: DC

## 2016-06-07 MED ORDER — CHLORHEXIDINE GLUCONATE CLOTH 2 % EX PADS: 6.0000 | MEDICATED_PAD | Freq: Once | CUTANEOUS | Status: DC

## 2016-06-07 MED ORDER — PROPOFOL 500 MG/50ML IV EMUL
INTRAVENOUS | Status: DC | PRN
  Administered 2016-06-07: 125 ug/kg/min via INTRAVENOUS

## 2016-06-07 MED ORDER — ONDANSETRON HCL 4 MG/2ML IJ SOLN
INTRAMUSCULAR | Status: AC
  Filled 2016-06-07: qty 2

## 2016-06-07 MED ORDER — EPHEDRINE SULFATE 50 MG/ML IJ SOLN
INTRAMUSCULAR | Status: AC
  Filled 2016-06-07: qty 1

## 2016-06-07 NOTE — Transfer of Care (Signed)
Immediate Anesthesia Transfer of Care Note  Patient: [name]  Procedure(s) Performed: Procedure(s) with comments: COLONOSCOPY WITH PROPOFOL (N/A) - 7:30 am  Patient Location: PACU  Anesthesia Type:MAC  Level of Consciousness: awake, alert  and oriented  Airway & Oxygen Therapy: Patient Spontanous Breathing and Patient connected to nasal cannula oxygen  Post-op Assessment: Report given to RN  Post vital signs: Reviewed and stable  Last Vitals:  Vitals:   06/07/16 0659  BP: 131/72  Pulse: 69  Resp: 18  Temp: 36.4 C    Last Pain:  Vitals:   06/07/16 0732  TempSrc:   PainSc: 8       Patients Stated Pain Goal: 8 (26/20/35 5974)  Complications: No apparent anesthesia complications

## 2016-06-07 NOTE — Discharge Instructions (Addendum)
PATIENT INSTRUCTIONS POST-ANESTHESIA  IMMEDIATELY FOLLOWING SURGERY:  Do not drive or operate machinery for the first twenty four hours after surgery.  Do not make any important decisions for twenty four hours after surgery or while taking narcotic pain medications or sedatives.  If you develop intractable nausea and vomiting or a severe headache please notify your doctor immediately.  FOLLOW-UP:  Please make an appointment with your surgeon as instructed. You do not need to follow up with anesthesia unless specifically instructed to do so.  WOUND CARE INSTRUCTIONS (if applicable):  Keep a dry clean dressing on the anesthesia/puncture wound site if there is drainage.  Once the wound has quit draining you may leave it open to air.  Generally you should leave the bandage intact for twenty four hours unless there is drainage.  If the epidural site drains for more than 36-48 hours please call the anesthesia department.  QUESTIONS?:  Please feel free to call your physician or the hospital operator if you have any questions, and they will be happy to assist you.      Diverticulosis Diverticulosis is the condition that develops when small pouches (diverticula) form in the wall of your colon. Your colon, or large intestine, is where water is absorbed and stool is formed. The pouches form when the inside layer of your colon pushes through weak spots in the outer layers of your colon. CAUSES  No one knows exactly what causes diverticulosis. RISK FACTORS  Being older than 82. Your risk for this condition increases with age. Diverticulosis is rare in people younger than 40 years. By age 38, almost everyone has it.  Eating a low-fiber diet.  Being frequently constipated.  Being overweight.  Not getting enough exercise.  Smoking.  Taking over-the-counter pain medicines, like aspirin and ibuprofen. SYMPTOMS  Most people with diverticulosis do not have symptoms. DIAGNOSIS  Because diverticulosis  often has no symptoms, health care providers often discover the condition during an exam for other colon problems. In many cases, a health care provider will diagnose diverticulosis while using a flexible scope to examine the colon (colonoscopy). TREATMENT  If you have never developed an infection related to diverticulosis, you may not need treatment. If you have had an infection before, treatment may include:  Eating more fruits, vegetables, and grains.  Taking a fiber supplement.  Taking a live bacteria supplement (probiotic).  Taking medicine to relax your colon. HOME CARE INSTRUCTIONS   Drink at least 6-8 glasses of water each day to prevent constipation.  Try not to strain when you have a bowel movement.  Keep all follow-up appointments. If you have had an infection before:  Increase the fiber in your diet as directed by your health care provider or dietitian.  Take a dietary fiber supplement if your health care provider approves.  Only take medicines as directed by your health care provider. SEEK MEDICAL CARE IF:   You have abdominal pain.  You have bloating.  You have cramps.  You have not gone to the bathroom in 3 days. SEEK IMMEDIATE MEDICAL CARE IF:   Your pain gets worse.  Yourbloating becomes very bad.  You have a fever or chills, and your symptoms suddenly get worse.  You begin vomiting.  You have bowel movements that are bloody or black. MAKE SURE YOU:  Understand these instructions.  Will watch your condition.  Will get help right away if you are not doing well or get worse. This information is not intended to replace advice given  to you by your health care provider. Make sure you discuss any questions you have with your health care provider. Document Released: 01/22/2004 Document Revised: 05/01/2013 Document Reviewed: 03/21/2013 Elsevier Interactive Patient Education  2017 [employer]. Constipation, Adult Constipation is when a  person:  Poops (has a bowel movement) fewer times in a week than normal.  Has a hard time pooping.  Has poop that is dry, hard, or bigger than normal. Follow these instructions at home: Eating and drinking  Eat foods that have a lot of fiber, such as:  Fresh fruits and vegetables.  Whole grains.  Beans.  Eat less of foods that are high in fat, low in fiber, or overly processed, such as:  [place] fries.  Hamburgers.  Cookies.  Candy.  Soda.  Drink enough fluid to keep your pee (urine) clear or pale yellow. General instructions  Exercise regularly or as told by your doctor.  Go to the restroom when you feel like you need to poop. Do not hold it in.  Take over-the-counter and prescription medicines only as told by your doctor. These include any fiber supplements.  Do pelvic floor retraining exercises, such as:  Doing deep breathing while relaxing your lower belly (abdomen).  Relaxing your pelvic floor while pooping.  Watch your condition for any changes.  Keep all follow-up visits as told by your doctor. This is important. Contact a doctor if:  You have pain that gets worse.  You have a fever.  You have not pooped for 4 days.  You throw up (vomit).  You are not hungry.  You lose weight.  You are bleeding from the anus.  You have thin, pencil-like poop (stool). Get help right away if:  You have a fever, and your symptoms suddenly get worse.  You leak poop or have blood in your poop.  Your belly feels hard or bigger than normal (is bloated).  You have very bad belly pain.  You feel dizzy or you faint. This information is not intended to replace advice given to you by your health care provider. Make sure you discuss any questions you have with your health care provider. Document Released: 10/13/2007 Document Revised: 11/14/2015 Document Reviewed: 10/15/2015 Elsevier Interactive Patient Education  2017 [place].  Colonoscopy Discharge  Instructions  Read the instructions outlined below and refer to this sheet in the next few weeks. These discharge instructions provide you with general information on caring for yourself after you leave the hospital. Your doctor may also give you specific instructions. While your treatment has been planned according to the most current medical practices available, unavoidable complications occasionally occur. If you have any problems or questions after discharge, call Dr. Gala Romney at [phone]. ACTIVITY  You may resume your regular activity, but move at a slower pace for the next 24 hours.   Take frequent rest periods for the next 24 hours.   Walking will help get rid of the air and reduce the bloated feeling in your belly (abdomen).   No driving for 24 hours (because of the medicine (anesthesia) used during the test).    Do not sign any important legal documents or operate any machinery for 24 hours (because of the anesthesia used during the test).  NUTRITION  Drink plenty of fluids.   You may resume your normal diet as instructed by your doctor.   Begin with a light meal and progress to your normal diet. Heavy or fried foods are harder to digest and may make you feel  sick to your stomach (nauseated).   Avoid alcoholic beverages for 24 hours or as instructed.  MEDICATIONS  You may resume your normal medications unless your doctor tells you otherwise.  WHAT YOU CAN EXPECT TODAY  Some feelings of bloating in the abdomen.   Passage of more gas than usual.   Spotting of blood in your stool or on the toilet paper.  IF YOU HAD POLYPS REMOVED DURING THE COLONOSCOPY:  No aspirin products for 7 days or as instructed.   No alcohol for 7 days or as instructed.   Eat a soft diet for the next 24 hours.  FINDING OUT THE RESULTS OF YOUR TEST Not all test results are available during your visit. If your test results are not back during the visit, make an appointment with your caregiver to find  out the results. Do not assume everything is normal if you have not heard from your caregiver or the medical facility. It is important for you to follow up on all of your test results.  SEEK IMMEDIATE MEDICAL ATTENTION IF:  You have more than a spotting of blood in your stool.   Your belly is swollen (abdominal distention).   You are nauseated or vomiting.   You have a temperature over 101.   You have abdominal pain or discomfort that is severe or gets worse throughout the day.    Constipation and diverticulosis information provided  Stop fiber gummy bear; begin Benefiber 1 tablespoon twice daily  Repeat colonoscopy in 5 years  Office visit with [place] in 3 months

## 2016-06-07 NOTE — Progress Notes (Signed)
CC'D TO PCP °

## 2016-06-07 NOTE — Op Note (Signed)
[facility] Patient Name: [name] Procedure Date: 06/07/2016 7:09 AM MRN: [id number] Date of Birth: [birth year] Attending MD: Norvel Richards , MD CSN: [id number] Age: 56 Admit Type: Outpatient Procedure:                Colonoscopy Indications:              High risk colon cancer surveillance: Personal                            history of colonic polyps Providers:                Norvel Richards, MD, Otis Peak B. Sharon Seller, RN,                            Aram Candela Referring MD:              Medicines:                Propofol per Anesthesia Complications:            No immediate complications. Estimated Blood Loss:     Estimated blood loss: none. Procedure:                Pre-Anesthesia Assessment:                           - Prior to the procedure, a History and Physical                            was performed, and patient medications and                            allergies were reviewed. The patient's tolerance of                            previous anesthesia was also reviewed. The risks                            and benefits of the procedure and the sedation                            options and risks were discussed with the patient.                            All questions were answered, and informed consent                            was obtained. Prior Anticoagulants: The patient has                            taken no previous anticoagulant or antiplatelet                            agents. ASA Grade Assessment: III - A patient with  severe systemic disease. After reviewing the risks                            and benefits, the patient was deemed in                            satisfactory condition to undergo the procedure.                           After obtaining informed consent, the colonoscope                            was passed under direct vision. Throughout the                            procedure, the patient's  blood pressure, pulse, and                            oxygen saturations were monitored continuously. The                            EC-[id number]) scope was introduced through                            the and advanced to the 5 cm into the ileum. The                            terminal ileum, ileocecal valve, appendiceal                            orifice, and rectum were photographed. The entire                            colon was well visualized. The patient tolerated                            the procedure well. The quality of the bowel                            preparation was adequate. Scope In: 7:38:02 AM Scope Out: 7:55:43 AM Scope Withdrawal Time: 0 hours 12 minutes 6 seconds  Total Procedure Duration: 0 hours 17 minutes 41 seconds  Findings:      The perianal and digital rectal examinations were normal aside from a       couple of hemorrhoid tags..      Scattered small and large-mouthed diverticula were found in the sigmoid       colon. diverticula on the distal and proximal side of anastomosis at 20       cm. Otherwise, the residual Colonic mucosa appeared normal      The exam was otherwise without abnormality on direct and retroflexion       views. Impression:               - Diverticulosis in the sigmoid colon. status post  post prior segmental resection- The examination was                            otherwise normal on direct and retroflexion views.                           - No specimens collected. Moderate Sedation:      Moderate (conscious) sedation was personally administered by an       anesthesia professional. The following parameters were monitored: oxygen       saturation, heart rate, blood pressure, respiratory rate, EKG, adequacy       of pulmonary ventilation, and response to care. Total physician       intraservice time was 30 minutes. Recommendation:           - Patient has a contact number available for                             emergencies. The signs and symptoms of potential                            delayed complications were discussed with the                            patient. Return to normal activities tomorrow.                            Written discharge instructions were provided to the                            patient.                           - Resume previous diet.                           - Continue present medications. stop fiber gummy                            bars. Begin Benefiber 1 tablespoon twice daily-                            Repeat colonoscopy in 5 years for surveillance.                           - Return to GI clinic in 3 months. Procedure Code(s):        --- Professional ---                           [phone], Colonoscopy, flexible; diagnostic, including                            collection of specimen(s) by brushing or washing,                            when performed (separate procedure) Diagnosis Code(s):        ---  Professional ---                           Z86.010, Personal history of colonic polyps                           K57.30, Diverticulosis of large intestine without                            perforation or abscess without bleeding CPT copyright 2016 American Medical Association. All rights reserved. The codes documented in this report are preliminary and upon coder review may  be revised to meet current compliance requirements. [name]. Dyane Broberg, MD Norvel Richards, MD 06/07/2016 8:08:32 AM This report has been signed electronically. Number of Addenda: 0

## 2016-06-07 NOTE — Interval H&P Note (Signed)
History and Physical Interval Note:  06/07/2016 7:22 AM  [name]  has presented today for surgery, with the diagnosis of history polyps, family history colon cancer  The various methods of treatment have been discussed with the patient and family. After consideration of risks, benefits and other options for treatment, the patient has consented to  Procedure(s) with comments: COLONOSCOPY WITH PROPOFOL (N/A) - 7:30 am as a surgical intervention .  The patient's history has been reviewed, patient examined, no change in status, stable for surgery.  I have reviewed the patient's chart and labs.  Questions were answered to the patient's satisfaction.     Robert Rourk  No change. Positive hCG erroneous. Patient has had a hysterectomy. Colonoscopy per plan.  The risks, benefits, limitations, alternatives and imponderables have been reviewed with the patient. Questions have been answered. All parties are agreeable.

## 2016-06-07 NOTE — Anesthesia Preprocedure Evaluation (Signed)
Anesthesia Evaluation  Patient identified by MRN, date of birth, ID band Patient awake    Reviewed: Allergy & Precautions, NPO status , Patient's Chart, lab work & pertinent test results  Airway Mallampati: II  TM Distance: >3 FB     Dental  (+) Teeth Intact   Pulmonary sleep apnea ,    breath sounds clear to auscultation       Cardiovascular negative cardio ROS   Rhythm:Regular Rate:Normal     Neuro/Psych  Headaches, PSYCHIATRIC DISORDERS Anxiety Depression Bipolar Disorder    GI/Hepatic GERD  ,  Endo/Other  diabetes, Type 2, Oral Hypoglycemic Agents  Renal/GU      Musculoskeletal   Abdominal   Peds  Hematology   Anesthesia Other Findings   Reproductive/Obstetrics                             Anesthesia Physical Anesthesia Plan  ASA: III  Anesthesia Plan: MAC   Post-op Pain Management:    Induction: Intravenous  Airway Management Planned: Simple Face Mask  Additional Equipment:   Intra-op Plan:   Post-operative Plan:   Informed Consent: I have reviewed the patients History and Physical, chart, labs and discussed the procedure including the risks, benefits and alternatives for the proposed anesthesia with the patient or authorized representative who has indicated his/her understanding and acceptance.     Plan Discussed with:   Anesthesia Plan Comments:         Anesthesia Quick Evaluation

## 2016-06-07 NOTE — Anesthesia Postprocedure Evaluation (Signed)
Anesthesia Post Note  Patient: [name]  Procedure(s) Performed: Procedure(s) (LRB): COLONOSCOPY WITH PROPOFOL (N/A)  Patient location during evaluation: Short Stay Anesthesia Type: MAC Level of consciousness: awake and alert and oriented Pain management: pain level controlled Vital Signs Assessment: post-procedure vital signs reviewed and stable Respiratory status: spontaneous breathing Cardiovascular status: blood pressure returned to baseline Postop Assessment: no signs of nausea or vomiting Anesthetic complications: no     Last Vitals:  Vitals:   06/07/16 0801 06/07/16 0832  BP: (!) 95/59 109/71  Pulse: 64 60  Resp: (!) 22 16  Temp: 36.4 C 36.3 C    Last Pain:  Vitals:   06/07/16 0832  TempSrc: Oral  PainSc:                  Tressie Stalker

## 2016-06-07 NOTE — H&P (View-Only) (Signed)
Primary Care Physician:  Purvis Kilts, MD  Primary Gastroenterologist:  Garfield Cornea, MD   Chief Complaint  Patient presents with  . Colonoscopy    HPI:  [name] is a 56 y.o. female here to schedule colonoscopy. She was last seen in 03/2016. H/o complicated diverticulitis in remote past requiring surgery. Treated empirically for diverticulitis in 02/2014, 12/2014, 03/2016. CT 03/2016 unremarkable except for stable ventral hernia. Last TCS 04/2012 at [facility] after failed attempt locally due to inadequate prep. H/O lap band. Did well with Miralax prep at [facility]. She has had recurrent LLQ pain worse with meals, increased urgency of stools but not loose. Then started having trouble having BM. Now on Linzess and 4 BM per week. Urgency for BM strong with episodes of incontinence. No brbpr or melena. No vomiting. gerd well controlled. Has gained over 55 lb in the past one year on abilify. We offered patient colonoscopy to further evaluate symptoms and she presents to have this scheduled today. At this time she denies LLQ pain.   Current Outpatient Prescriptions  Medication Sig Dispense Refill  . Albiglutide (TANZEUM) 50 MG PEN Inject 50 mg into the skin 2 (two) times daily.    Marland Kitchen ALPRAZolam (XANAX) 1 MG tablet Take 1 tablet (1 mg total) by mouth 2 (two) times daily as needed for anxiety. (Patient taking differently: Take 1 mg by mouth 3 (three) times daily. ) 180 tablet 2  . Calcium Carb-Cholecalciferol 500-600 MG-UNIT CHEW Chew 1 tablet by mouth 3 (three) times daily.    . carbamazepine (EQUETRO) 200 MG CP12 12 hr capsule Take 1 capsule (200 mg total) by mouth daily. 90 capsule 2  . cycloSPORINE (RESTASIS) 0.05 % ophthalmic emulsion Place 1 drop into both eyes 3 (three) times daily.     Marland Kitchen escitalopram (LEXAPRO) 20 MG tablet Take 1 tablet (20 mg total) by mouth daily. 90 tablet 2  . fluticasone (FLONASE) 50 MCG/ACT nasal spray Place 2 sprays into the nose daily as needed. Allergies    .  linaclotide (LINZESS) 290 MCG CAPS capsule Take 1 capsule (290 mcg total) by mouth daily. (Patient taking differently: Take 290 mcg by mouth daily as needed. ) 90 capsule 3  . lisinopril (PRINIVIL,ZESTRIL) 10 MG tablet Take 10 mg by mouth daily.    Marland Kitchen loratadine (CLARITIN) 10 MG tablet Take 10 mg by mouth daily.    . meclizine (ANTIVERT) 25 MG tablet Take 25 mg by mouth daily as needed for dizziness.   2  . meloxicam (MOBIC) 15 MG tablet Take 1 tablet (15 mg total) by mouth daily. 30 tablet 5  . metFORMIN (GLUCOPHAGE) 500 MG tablet Take 500 mg by mouth 2 (two) times daily.    . methocarbamol (ROBAXIN) 500 MG tablet Take 500 mg by mouth 2 (two) times daily as needed for muscle spasms.    . Multiple Vitamin (MULTIVITAMIN PO) Take 2 tablets by mouth daily. ADULT GUMMY VITAMINS    . oxyCODONE-acetaminophen (PERCOCET/ROXICET) 5-325 MG tablet Take 1 tablet by mouth every 6 (six) hours as needed for moderate pain or severe pain (Must last 30 days.Do not drive or operate machinery while taking this medicine). 120 tablet 0  . pantoprazole (PROTONIX) 40 MG tablet Take 1 tablet (40 mg total) by mouth 2 (two) times daily. 180 tablet 3  . polyethylene glycol (MIRALAX / GLYCOLAX) packet Take 17 g by mouth daily as needed for mild constipation.     Marland Kitchen Propylene Glycol-Glycerin (SOOTHE) 0.6-0.6 % SOLN Place 1  drop into both eyes 2 (two) times daily.    . rizatriptan (MAXALT-MLT) 10 MG disintegrating tablet Take 10 mg by mouth as needed for migraine. May repeat in 2 hours if needed    . traZODone (DESYREL) 50 MG tablet Take 1 tablet (50 mg total) by mouth at bedtime. (Patient taking differently: Take 50 mg by mouth at bedtime as needed for sleep. ) 90 tablet 2  . Sod Picosulfate-Mag Ox-Cit Acd 10-3.5-12 MG-GM-GM PACK Take 1 Container by mouth as directed. 1 each 0   No current facility-administered medications for this visit.     Allergies as of 06/04/2016 - Review Complete 06/04/2016  Allergen Reaction Noted  .  Bactrim Itching, Nausea And Vomiting, and Other (See Comments) 01/09/2011  . Neurontin [gabapentin] Palpitations and Other (See Comments) 07/21/2012    Past Medical History:  Diagnosis Date  . Adenomatous polyp 2009  . Anxiety   . Arthritis   . Bipolar 1 disorder ([place])   . Constipation   . Depression   . Diabetes mellitus   . Diabetes mellitus, type II ([place])   . Diverticula of colon 2009  . Generalized headaches   . Migraine   . Obesity   . Pelvic floor dysfunction    abnormal anorectal manometry at [facility] in 2009  . Plantar fasciitis   . Psoriasis   . Sleep apnea     Past Surgical History:  Procedure Laterality Date  . ABDOMINAL HYSTERECTOMY    . COLON RESECTION    03/30/2002   with end-colostomy and Hartmann's pouch  . COLON SURGERY     complicated diverticulitis requiring sigmoid resection with colostomy and subsequent takedown  . COLONOSCOPY  12/11/2007   Dr. Gala Romney- marginal prep, normal rectum pancolonic diverticula, adenomatous polyp  . COLONOSCOPY  08/28/2003      Wide open colonic anastomosis/Scattered diverticula noted throughout colon/ Small external hemorrhoids  . COLONOSCOPY  04/2012   [facility]: hyperplastic polyps, diverticulosis, ileocolonic anastomosis.  . COLOSTOMY CLOSURE  07/10/2002  . FLEXIBLE SIGMOIDOSCOPY  01/20/2012   RMR: incomplete/attempted colonoscopy. Inadequate prep precluded examination  . HEEL SPUR SURGERY  09/11/13  . KNEE ARTHROSCOPY  02/04/2004    left knee/partial medial meniscectomy.  Marland Kitchen KNEE SURGERY     3 arthroscopic   . LAPAROSCOPIC GASTRIC BANDING  2009  . LAPAROSCOPIC SALPINGOOPHERECTOMY  03/30/2002  . TUBAL LIGATION      Family History  Problem Relation Age of Onset  . Ovarian cancer Mother   . Heart disease Mother   . Anxiety disorder Mother   . Cirrhosis Father     deceased age 39  . Alcohol abuse Father   . Liver cancer Cousin     age 38, deceased  . Drug abuse Cousin   . ADD / ADHD Son   . Anxiety disorder Sister   .  Dementia Maternal Grandfather   . Colon cancer Other     aunt, deceased age 34  . Breast cancer Other     aunt, deceased age 34  . Bipolar disorder Neg Hx   . Depression Neg Hx   . OCD Neg Hx   . Paranoid behavior Neg Hx   . Schizophrenia Neg Hx   . Seizures Neg Hx   . Sexual abuse Neg Hx   . Physical abuse Neg Hx     Social History   Social History  . Marital status: Married    Spouse name: N/A  . Number of children: 2  . Years of education:  college   Occupational History  . student at [employer]  .  Disabled   Social History Main Topics  . Smoking status: Never Smoker  . Smokeless tobacco: Never Used  . Alcohol use No     Comment: occasionally  . Drug use: No  . Sexual activity: Not Currently   Other Topics Concern  . Not on file   Social History Narrative  . No narrative on file      ROS:  General: Negative for anorexia, weight loss, fever, chills, fatigue, weakness. Eyes: Negative for vision changes.  ENT: Negative for hoarseness, difficulty swallowing , nasal congestion. CV: Negative for chest pain, angina, palpitations, dyspnea on exertion, peripheral edema.  Respiratory: Negative for dyspnea at rest, dyspnea on exertion, cough, sputum, wheezing.  GI: See history of present illness. GU:  Negative for dysuria, hematuria, urinary incontinence, urinary frequency, nocturnal urination.  MS: Negative for joint pain, low back pain.  Derm: Negative for rash or itching.  Neuro: Negative for weakness, abnormal sensation, seizure, frequent headaches, memory loss, confusion.  Psych: Negative for anxiety, depression, suicidal ideation, hallucinations.  Endo: Negative for unusual weight change.  Heme: Negative for bruising or bleeding. Allergy: Negative for rash or hives.    Physical Examination:  BP 122/77   Pulse 66   Temp 97.5 F (36.4 C)   Ht 5' 6.5" (1.689 m)   Wt 299 lb 6.4 oz (135.8 kg)   BMI 47.60 kg/m    General: Well-nourished,  well-developed in no acute distress.  Head: Normocephalic, atraumatic.   Eyes: Conjunctiva pink, no icterus. Mouth: Oropharyngeal mucosa moist and pink , no lesions erythema or exudate. Neck: Supple without thyromegaly, masses, or lymphadenopathy.  Lungs: Clear to auscultation bilaterally.  Heart: Regular rate and rhythm, no murmurs rubs or gallops.  Abdomen: Bowel sounds are normal, obese,  nontender, nondistended, no hepatosplenomegaly or masses, no abdominal bruits or    hernia , no rebound or guarding.   Rectal: not performed Extremities: No lower extremity edema. No clubbing or deformities.  Neuro: Alert and oriented x 4 , grossly normal neurologically.  Skin: Warm and dry, no rash or jaundice.   Psych: Alert and cooperative, normal mood and affect.   Imaging Studies: No results found.

## 2016-06-08 ENCOUNTER — Encounter (HOSPITAL_COMMUNITY): Payer: Self-pay | Admitting: Internal Medicine

## 2016-06-20 NOTE — Progress Notes (Signed)
Ask patient to do repeat serum HCG quantitative test.

## 2016-06-24 ENCOUNTER — Other Ambulatory Visit: Payer: Self-pay

## 2016-06-24 ENCOUNTER — Telehealth: Payer: Self-pay | Admitting: Obstetrics & Gynecology

## 2016-06-24 ENCOUNTER — Encounter: Payer: Self-pay | Admitting: Orthopaedic Surgery

## 2016-06-24 ENCOUNTER — Other Ambulatory Visit: Payer: Self-pay | Admitting: Gastroenterology

## 2016-06-24 ENCOUNTER — Encounter: Payer: Self-pay | Admitting: *Deleted

## 2016-06-24 ENCOUNTER — Ambulatory Visit (INDEPENDENT_AMBULATORY_CARE_PROVIDER_SITE_OTHER): Payer: Medicare Other | Admitting: Orthopaedic Surgery

## 2016-06-24 VITALS — BP 124/86 | HR 75 | Temp 97.2°F | Ht 66.5 in | Wt 303.0 lb

## 2016-06-24 DIAGNOSIS — M25561 Pain in right knee: Secondary | ICD-10-CM | POA: Diagnosis not present

## 2016-06-24 DIAGNOSIS — G8929 Other chronic pain: Secondary | ICD-10-CM

## 2016-06-24 DIAGNOSIS — M25562 Pain in left knee: Secondary | ICD-10-CM | POA: Diagnosis not present

## 2016-06-24 DIAGNOSIS — Z3201 Encounter for pregnancy test, result positive: Secondary | ICD-10-CM

## 2016-06-24 MED ORDER — OXYCODONE-ACETAMINOPHEN 5-325 MG PO TABS: 1.0000 | ORAL_TABLET | Freq: Four times a day (QID) | ORAL | 0 refills | Status: DC | PRN

## 2016-06-24 NOTE — Progress Notes (Signed)
CC: Both of my knees are hurting. I would like an injection in both knees.  The patient has had chronic pain and tenderness of both knees for some time.  Injections help.  There is no locking or giving way of the knee.  There is no new trauma. There is no redness or signs of infections.  The knees have a mild effusion and some crepitus.  There is no redness or signs of recent trauma.  Right knee ROM is 0-95 and left knee ROM is 0-100.  Impression:  Chronic pain of the both knees  Return:  1 month  PROCEDURE NOTE:  The patient requests injections of both knees, verbal consent was obtained.  The left and right knee were individually prepped appropriately after time out was performed.   Sterile technique was observed and injection of 1 cc of Depo-Medrol 40 mg with several cc's of plain xylocaine. Anesthesia was provided by ethyl chloride and a 20-gauge needle was used to inject each knee area. The injections were tolerated well.  A band aid dressing was applied.  The patient was advised to apply ice later today and tomorrow to the injection sight as needed.  I have reviewed the [place] web site prior to prescribing narcotic medicine for this patient.  Electronically Signed Sanjuana Kava, MD 2/15/20182:28 PM

## 2016-06-24 NOTE — Telephone Encounter (Signed)
Will send mychart message.

## 2016-06-26 LAB — HCG, SERUM, QUALITATIVE: hCG,Beta Subunit,Qual,Serum: NEGATIVE m[IU]/mL (ref <6)

## 2016-06-28 NOTE — Progress Notes (Signed)
I asked for a quantitative test. This test goes down to 73mlU/mL. Can we find out how low the quantitative test goes down to or we have to have the quantitative test done.

## 2016-06-29 ENCOUNTER — Telehealth: Payer: Self-pay

## 2016-06-29 LAB — BETA HCG QUANT (REF LAB): HCG QUANT: 5 m[IU]/mL

## 2016-06-29 LAB — SPECIMEN STATUS REPORT

## 2016-06-29 NOTE — Telephone Encounter (Signed)
Spoke with the pt about her blood work results. Pt said she continues to have abd pain, nausea with eating, she feels full at the time and has migraines. She said her banding port has moved and she is scheduled to see her doctor that did that surgery next month.  Pt spoke with a friend about the positive pregnancy test and the friend told her that the same thing had happened to that person and they had cancer. Pt is now concerned she may have a tumor somewhere. She would like [facility] to look at her ct from November again to make sure there is nothing there and make any recommendations for the symptoms she is having.

## 2016-06-29 NOTE — Progress Notes (Signed)
HCG Quant unremarkable/negative.

## 2016-06-30 NOTE — Telephone Encounter (Signed)
Pt is aware. Please schedule ov with RMR. 

## 2016-06-30 NOTE — Telephone Encounter (Signed)
If she is having nausea with meals and feeling full early (early satiety) I would recommend dropping back to liquid diet when these symptoms are most significant. At other times, she should make sure she is eating very small snack/meal every 3 hours or so, never overeat especially with h/o lapband and chronic pain meds, likely delayed gastric emptying.   I also recommend she come back in and let RMR evaluate her for her more chronic GI symptoms.   I would recommend she see gyn or pcp for further work up of things that can cause elevated HCG levels ie pituitary secretion, nongestational malignancies (although reinforce to her that the follow test was normal).

## 2016-07-01 ENCOUNTER — Ambulatory Visit: Payer: Self-pay | Admitting: Orthopaedic Surgery

## 2016-07-01 ENCOUNTER — Encounter: Payer: Self-pay | Admitting: Internal Medicine

## 2016-07-01 NOTE — Telephone Encounter (Signed)
APPT MADE AND LETTER SENT  °

## 2016-07-14 ENCOUNTER — Telehealth: Payer: Self-pay | Admitting: General Practice

## 2016-07-14 NOTE — Telephone Encounter (Signed)
I received a letter from [place] that the paitient's lab test(HCG Serum) may be denied.    I called customer service for [facility] at [phone] and spoke with Floreen Comber and made her aware that we repeated the test at the request of the lab since the first test was a "weak negative".  She documented the information and stated they will call us if they had any further questions.

## 2016-07-15 ENCOUNTER — Ambulatory Visit (INDEPENDENT_AMBULATORY_CARE_PROVIDER_SITE_OTHER): Payer: Medicare Other | Admitting: Obstetrics & Gynecology

## 2016-07-15 ENCOUNTER — Encounter: Payer: Self-pay | Admitting: Obstetrics & Gynecology

## 2016-07-15 ENCOUNTER — Other Ambulatory Visit (HOSPITAL_COMMUNITY)
Admission: RE | Admit: 2016-07-15 | Discharge: 2016-07-15 | Disposition: A | Discharge status: Home / Self Care | Payer: Medicare Other | Source: Ambulatory Visit | Attending: Obstetrics & Gynecology | Admitting: Obstetrics & Gynecology

## 2016-07-15 VITALS — BP 130/80 | Ht 63.39 in | Wt 302.0 lb

## 2016-07-15 DIAGNOSIS — Z124 Encounter for screening for malignant neoplasm of cervix: Secondary | ICD-10-CM

## 2016-07-15 DIAGNOSIS — Z01419 Encounter for gynecological examination (general) (routine) without abnormal findings: Secondary | ICD-10-CM | POA: Diagnosis present

## 2016-07-15 DIAGNOSIS — Z1151 Encounter for screening for human papillomavirus (HPV): Secondary | ICD-10-CM | POA: Diagnosis present

## 2016-07-15 DIAGNOSIS — Z1211 Encounter for screening for malignant neoplasm of colon: Secondary | ICD-10-CM

## 2016-07-15 DIAGNOSIS — Z1212 Encounter for screening for malignant neoplasm of rectum: Secondary | ICD-10-CM

## 2016-07-15 LAB — HEMOCCULT GUIAC POC 1CARD (OFFICE): FECAL OCCULT BLD: NEGATIVE

## 2016-07-15 NOTE — Progress Notes (Signed)
Subjective:     [name] is a 56 y.o. female here for a routine exam.  No LMP recorded. Patient has had a hysterectomy. G2P2 Birth Control Method:  hysterectomy Menstrual Calendar(currently): amenorrheic  Current complaints: back and knee pain.   Current acute medical issues:  orthopedic   Recent Gynecologic History No LMP recorded. Patient has had a hysterectomy. Last Pap: 2016,  normal Last mammogram: 02/2016,  normal  Past Medical History:  Diagnosis Date  . Adenomatous polyp 2009  . Anxiety   . Arthritis   . Bipolar 1 disorder ([place])   . Constipation   . Depression   . Diabetes mellitus   . Diabetes mellitus, type II ([place])   . Diverticula of colon 2009  . Generalized headaches   . Migraine   . Obesity   . Pelvic floor dysfunction    abnormal anorectal [facility]ometry at [facility] in 2009  . Plantar fasciitis   . Psoriasis   . Sleep apnea     Past Surgical History:  Procedure Laterality Date  . ABDOMINAL HYSTERECTOMY    . COLON RESECTION    03/30/2002   with end-colostomy and Hartmann's pouch  . COLON SURGERY     complicated diverticulitis requiring sigmoid resection with colostomy and subsequent takedown  . COLONOSCOPY  12/11/2007   Dr. Gala Romney- marginal prep, normal rectum pancolonic diverticula, adenomatous polyp  . COLONOSCOPY  08/28/2003      Wide open colonic anastomosis/Scattered diverticula noted throughout colon/ Small external hemorrhoids  . COLONOSCOPY  04/2012   [facility]: hyperplastic polyps, diverticulosis, ileocolonic anastomosis.  . COLONOSCOPY WITH PROPOFOL N/A 06/07/2016   Procedure: COLONOSCOPY WITH PROPOFOL;  Surgeon: Daneil Dolin, MD;  Location: [facility];  Service: Endoscopy;  Laterality: N/A;  7:30 am  . COLOSTOMY CLOSURE  07/10/2002  . FLEXIBLE SIGMOIDOSCOPY  01/20/2012   RMR: incomplete/attempted colonoscopy. Inadequate prep precluded examination  . HEEL SPUR SURGERY  09/11/13  . KNEE ARTHROSCOPY  02/04/2004    left knee/partial medial  meniscectomy.  Marland Kitchen KNEE SURGERY     3 arthroscopic   . LAPAROSCOPIC GASTRIC BANDING  2009  . LAPAROSCOPIC SALPINGOOPHERECTOMY  03/30/2002  . TUBAL LIGATION      OB History    Gravida Para Term Preterm AB Living   2 2       2    SAB TAB Ectopic Multiple Live Births                  Social History   Social History  . Marital status: Married    Spouse name: N/A  . Number of children: 2  . Years of education: college   Occupational History  . student at [employer]  .  Disabled   Social History Main Topics  . Smoking status: Never Smoker  . Smokeless tobacco: Never Used  . Alcohol use No     Comment: occasionally  . Drug use: No  . Sexual activity: Not Currently    Birth control/ protection: Surgical   Other Topics Concern  . None   Social History Narrative  . None    Family History  Problem Relation Age of Onset  . Ovarian cancer Mother   . Heart disease Mother   . Anxiety disorder Mother   . Cirrhosis Father     deceased age 6  . Alcohol abuse Father   . Liver cancer Cousin     age 88, deceased  . Drug abuse Cousin   . ADD / ADHD  Son   . Anxiety disorder Sister   . Dementia Maternal Grandfather   . Colon cancer Other     aunt, deceased age 51  . Breast cancer Other     aunt, deceased age 40  . Bipolar disorder Neg Hx   . Depression Neg Hx   . OCD Neg Hx   . Paranoid behavior Neg Hx   . Schizophrenia Neg Hx   . Seizures Neg Hx   . Sexual abuse Neg Hx   . Physical abuse Neg Hx      Current Outpatient Prescriptions:  .  Albiglutide (TANZEUM) 50 MG PEN, Inject 50 mg into the skin 2 (two) times daily., Disp: , Rfl:  .  ALPRAZolam (XANAX) 1 MG tablet, Take 1 tablet (1 mg total) by mouth 2 (two) times daily as needed for anxiety. (Patient taking differently: Take 1 mg by mouth 3 (three) times daily. ), Disp: 180 tablet, Rfl: 2 .  Calcium Carb-Cholecalciferol 500-600 MG-UNIT CHEW, Chew 1 tablet by mouth 3 (three) times daily., Disp: , Rfl:  .   carbamazepine (EQUETRO) 200 MG CP12 12 hr capsule, Take 1 capsule (200 mg total) by mouth daily., Disp: 90 capsule, Rfl: 2 .  cycloSPORINE (RESTASIS) 0.05 % ophthalmic emulsion, Place 1 drop into both eyes 3 (three) times daily. , Disp: , Rfl:  .  escitalopram (LEXAPRO) 20 MG tablet, Take 1 tablet (20 mg total) by mouth daily., Disp: 90 tablet, Rfl: 2 .  fluticasone (FLONASE) 50 MCG/ACT nasal spray, Place 2 sprays into the nose daily as needed. Allergies, Disp: , Rfl:  .  linaclotide (LINZESS) 290 MCG CAPS capsule, Take 1 capsule (290 mcg total) by mouth daily. (Patient taking differently: Take 290 mcg by mouth daily as needed. ), Disp: 90 capsule, Rfl: 3 .  lisinopril (PRINIVIL,ZESTRIL) 10 MG tablet, Take 10 mg by mouth daily., Disp: , Rfl:  .  loratadine (CLARITIN) 10 MG tablet, Take 10 mg by mouth daily., Disp: , Rfl:  .  meclizine (ANTIVERT) 25 MG tablet, Take 25 mg by mouth daily as needed for dizziness. , Disp: , Rfl: 2 .  meloxicam (MOBIC) 15 MG tablet, Take 1 tablet (15 mg total) by mouth daily., Disp: 30 tablet, Rfl: 5 .  metFORMIN (GLUCOPHAGE) 500 MG tablet, Take 500 mg by mouth 2 (two) times daily., Disp: , Rfl:  .  methocarbamol (ROBAXIN) 500 MG tablet, Take 500 mg by mouth 2 (two) times daily as needed for muscle spasms., Disp: , Rfl:  .  Multiple Vitamin (MULTIVITAMIN PO), Take 2 tablets by mouth daily. ADULT GUMMY VITAMINS, Disp: , Rfl:  .  oxyCODONE-acetaminophen (PERCOCET/ROXICET) 5-325 MG tablet, Take 1 tablet by mouth every 6 (six) hours as needed for moderate pain or severe pain (Must last 30 days.Do not drive or operate machinery while taking this medicine)., Disp: 110 tablet, Rfl: 0 .  pantoprazole (PROTONIX) 40 MG tablet, Take 1 tablet (40 mg total) by mouth 2 (two) times daily., Disp: 180 tablet, Rfl: 3 .  polyethylene glycol (MIRALAX / GLYCOLAX) packet, Take 17 g by mouth daily as needed for mild constipation. , Disp: , Rfl:  .  Propylene Glycol-Glycerin (SOOTHE) 0.6-0.6 %  SOLN, Place 1 drop into both eyes 2 (two) times daily., Disp: , Rfl:  .  rizatriptan (MAXALT-MLT) 10 MG disintegrating tablet, Take 10 mg by mouth as needed for migraine. May repeat in 2 hours if needed, Disp: , Rfl:  .  Sod Picosulfate-Mag Ox-Cit Acd 10-3.5-12 MG-GM-GM PACK, Take 1  Container by mouth as directed., Disp: 1 each, Rfl: 0 .  traZODone (DESYREL) 50 MG tablet, Take 1 tablet (50 mg total) by mouth at bedtime. (Patient taking differently: Take 50 mg by mouth at bedtime as needed for sleep. ), Disp: 90 tablet, Rfl: 2  Review of Systems  Review of Systems  Constitutional: Negative for fever, chills, weight loss, malaise/fatigue and diaphoresis.  HENT: Negative for hearing loss, ear pain, nosebleeds, congestion, sore throat, neck pain, tinnitus and ear discharge.   Eyes: Negative for blurred vision, double vision, photophobia, pain, discharge and redness.  Respiratory: Negative for cough, hemoptysis, sputum production, shortness of breath, wheezing and stridor.   Cardiovascular: Negative for chest pain, palpitations, orthopnea, claudication, leg swelling and PND.  Gastrointestinal: negative for abdominal pain. Negative for heartburn, nausea, vomiting, diarrhea, constipation, blood in stool and melena.  Genitourinary: Negative for dysuria, urgency, frequency, hematuria and flank pain.  Musculoskeletal: Negative for myalgias, back pain, joint pain and falls.  Skin: Negative for itching and rash.  Neurological: Negative for dizziness, tingling, tremors, sensory change, speech change, focal weakness, seizures, loss of consciousness, weakness and headaches.  Endo/Heme/Allergies: Negative for environmental allergies and polydipsia. Does not bruise/bleed easily.  Psychiatric/Behavioral: Negative for depression, suicidal ideas, hallucinations, memory loss and substance abuse. The patient is not nervous/anxious and does not have insomnia.        Objective:  Blood pressure 130/80, height 5'  3.39" (1.61 m), weight (!) 302 lb (137 kg).   Physical Exam  Vitals reviewed. Constitutional: She is oriented to person, place, and time. She appears well-developed and well-nourished.  HENT:  Head: Normocephalic and atraumatic.        Right Ear: External ear normal.  Left Ear: External ear normal.  Nose: Nose normal.  Mouth/Throat: Oropharynx is clear and moist.  Eyes: Conjunctivae and EOM are normal. Pupils are equal, round, and reactive to light. Right eye exhibits no discharge. Left eye exhibits no discharge. No scleral icterus.  Neck: Normal range of motion. Neck supple. No tracheal deviation present. No thyromegaly present.  Cardiovascular: Normal rate, regular rhythm, normal heart sounds and intact distal pulses.  Exam reveals no gallop and no friction rub.   No murmur heard. Respiratory: Effort normal and breath sounds normal. No respiratory distress. She has no wheezes. She has no rales. She exhibits no tenderness.  GI: Soft. Bowel sounds are normal. She exhibits no distension and no mass. There is no tenderness. There is no rebound and no guarding.  Genitourinary:  Breasts no masses skin changes or nipple changes bilaterally      Vulva is normal without lesions Vagina is pink moist without discharge Cervix normal in appearance and pap is done(supracervical hysterectomy) Uterus is absent Adnexa is negative  {Rectal    hemoccult negative, normal tone, no masses  Musculoskeletal: Normal range of motion. She exhibits no edema and no tenderness.  Neurological: She is alert and oriented to person, place, and time. She has normal reflexes. She displays normal reflexes. No cranial nerve deficit. She exhibits normal muscle tone. Coordination normal.  Skin: Skin is warm and dry. No rash noted. No erythema. No pallor.  Psychiatric: She has a normal mood and affect. Her behavior is normal. Judgment and thought content normal.       Medications Ordered at today's visit: No orders of the  defined types were placed in this encounter.   Other orders placed at today's visit: No orders of the defined types were placed in this encounter.  Assessment:    Healthy female exam.    Plan:    Contraception: status post hysterectomy. Mammogram ordered. Follow up in: 1 year.     Return in about 2 years (around 07/16/2018) for yearly, Follow up.

## 2016-07-16 LAB — CYTOLOGY - PAP
Diagnosis: NEGATIVE
HPV (WINDOPATH): NOT DETECTED

## 2016-07-22 ENCOUNTER — Ambulatory Visit: Payer: Self-pay | Admitting: Orthopaedic Surgery

## 2016-07-27 ENCOUNTER — Ambulatory Visit (INDEPENDENT_AMBULATORY_CARE_PROVIDER_SITE_OTHER): Payer: Medicare Other | Admitting: Internal Medicine

## 2016-07-27 VITALS — BP 124/75 | HR 77 | Temp 98.0°F | Ht 63.0 in | Wt 300.4 lb

## 2016-07-27 DIAGNOSIS — K219 Gastro-esophageal reflux disease without esophagitis: Secondary | ICD-10-CM

## 2016-07-27 DIAGNOSIS — K5900 Constipation, unspecified: Secondary | ICD-10-CM

## 2016-07-27 DIAGNOSIS — R110 Nausea: Secondary | ICD-10-CM | POA: Diagnosis not present

## 2016-07-27 NOTE — Patient Instructions (Signed)
Continue protonix 40 mg twice daily  Take miralax each morning; take a second dose in the evening if no bowel movement on any given day  Continue Linzess 290 daily  Office visit in 6 months  Keep appointment with Dr. Lucia Gaskins.

## 2016-07-27 NOTE — Progress Notes (Signed)
Primary Care Physician:  Purvis Kilts, MD Primary Gastroenterologist:  Dr. Gala Romney  Pre-Procedure History & Physical: HPI:  [name] is a 56 y.o. female here for follow-up of constipation/GERD. Morbidly obese. Lap band in place. Feels like it needs to be adjusted; seeing Dr. Lucia Gaskins the end of this week.  Occasional nausea;  no vomiting. Denies dysphagia. Reflux controlled on Protonix 40 mg twice daily.  With Linzess 290 and MiiraLAX 17 g each morning,  patient achieves a bowel movement on the order of 2 times weekly. On daily opioids  When she has additional bowel movement,  she feels much better. Continues to have a challenge losing weight. Does do water exercises at the [facility]. Colonic purge given to her when she was last here was very helpful  She is under a lot of stress recently will a loss of her pet dog and her brother's stage IV cancer.  Past Medical History:  Diagnosis Date  . Adenomatous polyp 2009  . Anxiety   . Arthritis   . Bipolar 1 disorder ([place])   . Constipation   . Depression   . Diabetes mellitus   . Diabetes mellitus, type II ([place])   . Diverticula of colon 2009  . Generalized headaches   . Migraine   . Obesity   . Pelvic floor dysfunction    abnormal anorectal manometry at [facility] in 2009  . Plantar fasciitis   . Psoriasis   . Sleep apnea     Past Surgical History:  Procedure Laterality Date  . ABDOMINAL HYSTERECTOMY    . COLON RESECTION    03/30/2002   with end-colostomy and Hartmann's pouch  . COLON SURGERY     complicated diverticulitis requiring sigmoid resection with colostomy and subsequent takedown  . COLONOSCOPY  12/11/2007   Dr. Gala Romney- marginal prep, normal rectum pancolonic diverticula, adenomatous polyp  . COLONOSCOPY  08/28/2003      Wide open colonic anastomosis/Scattered diverticula noted throughout colon/ Small external hemorrhoids  . COLONOSCOPY  04/2012   [facility]: hyperplastic polyps, diverticulosis, ileocolonic anastomosis.    . COLONOSCOPY WITH PROPOFOL N/A 06/07/2016   Procedure: COLONOSCOPY WITH PROPOFOL;  Surgeon: Daneil Dolin, MD;  [facility];  Service: Endoscopy;  Laterality: N/A;  7:30 am  . COLOSTOMY CLOSURE  07/10/2002  . FLEXIBLE SIGMOIDOSCOPY  01/20/2012   RMR: incomplete/attempted colonoscopy. Inadequate prep precluded examination  . HEEL SPUR SURGERY  09/11/13  . KNEE ARTHROSCOPY  02/04/2004    left knee/partial medial meniscectomy.  Marland Kitchen KNEE SURGERY     3 arthroscopic   . LAPAROSCOPIC GASTRIC BANDING  2009  . LAPAROSCOPIC SALPINGOOPHERECTOMY  03/30/2002  . TUBAL LIGATION      Prior to Admission medications   Medication Sig Start Date End Date Taking? Authorizing Provider  Albiglutide (TANZEUM) 50 MG PEN Inject 50 mg into the skin 2 (two) times daily.   Yes Historical Provider, MD  ALPRAZolam Duanne Moron) 1 MG tablet Take 1 tablet (1 mg total) by mouth 2 (two) times daily as needed for anxiety. Patient taking differently: Take 1 mg by mouth 3 (three) times daily.  04/27/16  Yes Cloria Spring, MD  Calcium Carb-Cholecalciferol 500-600 MG-UNIT CHEW Chew 1 tablet by mouth 3 (three) times daily.   Yes Historical Provider, MD  carbamazepine (EQUETRO) 200 MG CP12 12 hr capsule Take 1 capsule (200 mg total) by mouth daily. 04/27/16  Yes Cloria Spring, MD  cycloSPORINE (RESTASIS) 0.05 % ophthalmic emulsion Place 1 drop into  both eyes 3 (three) times daily.    Yes Historical Provider, MD  escitalopram (LEXAPRO) 20 MG tablet Take 1 tablet (20 mg total) by mouth daily. 04/27/16 04/27/17 Yes Cloria Spring, MD  fluticasone [facility]) 50 MCG/ACT nasal spray Place 2 sprays into the nose daily as needed. Allergies   Yes Historical Provider, MD  linaclotide (LINZESS) 290 MCG CAPS capsule Take 1 capsule (290 mcg total) by mouth daily. Patient taking differently: Take 290 mcg by mouth daily as needed.  10/08/15  Yes Mahala Menghini, PA-C  lisinopril (PRINIVIL,ZESTRIL) 10 MG tablet Take 10 mg by mouth daily.    Yes Historical Provider, MD  loratadine (CLARITIN) 10 MG tablet Take 10 mg by mouth daily.   Yes Historical Provider, MD  meclizine (ANTIVERT) 25 MG tablet Take 25 mg by mouth daily as needed for dizziness.  10/17/14  Yes Historical Provider, MD  meloxicam (MOBIC) 15 MG tablet Take 1 tablet (15 mg total) by mouth daily. 05/28/16  Yes Sanjuana Kava, MD  metFORMIN (GLUCOPHAGE) 500 MG tablet Take 500 mg by mouth 2 (two) times daily.   Yes Historical Provider, MD  methocarbamol (ROBAXIN) 500 MG tablet Take 500 mg by mouth 2 (two) times daily as needed for muscle spasms.   Yes Historical Provider, MD  Multiple Vitamin (MULTIVITAMIN PO) Take 2 tablets by mouth daily. ADULT GUMMY VITAMINS   Yes Historical Provider, MD  oxyCODONE-acetaminophen (PERCOCET/ROXICET) 5-325 MG tablet Take 1 tablet by mouth every 6 (six) hours as needed for moderate pain or severe pain (Must last 30 days.Do not drive or operate machinery while taking this medicine). 06/24/16  Yes Sanjuana Kava, MD  pantoprazole (PROTONIX) 40 MG tablet Take 1 tablet (40 mg total) by mouth 2 (two) times daily. 10/08/15  Yes Mahala Menghini, PA-C  polyethylene glycol (MIRALAX / GLYCOLAX) packet Take 17 g by mouth daily as needed for mild constipation.    Yes Historical Provider, MD  Propylene Glycol-Glycerin (SOOTHE) 0.6-0.6 % SOLN Place 1 drop into both eyes 2 (two) times daily.   Yes Historical Provider, MD  rizatriptan (MAXALT-MLT) 10 MG disintegrating tablet Take 10 mg by mouth as needed for migraine. May repeat in 2 hours if needed   Yes Historical Provider, MD  traZODone (DESYREL) 50 MG tablet Take 1 tablet (50 mg total) by mouth at bedtime. Patient taking differently: Take 50 mg by mouth at bedtime as needed for sleep.  04/27/16  Yes Cloria Spring, MD  Sod Picosulfate-Mag Ox-Cit Acd 10-3.5-12 MG-GM-GM PACK Take 1 Container by mouth as directed. Patient not taking: Reported on 07/27/2016 06/04/16   Daneil Dolin, MD    Allergies as of 07/27/2016 -  Review Complete 07/27/2016  Allergen Reaction Noted  . Bactrim Itching, Nausea And Vomiting, and Other (See Comments) 01/09/2011  . Neurontin [gabapentin] Palpitations and Other (See Comments) 07/21/2012    Family History  Problem Relation Age of Onset  . Ovarian cancer Mother   . Heart disease Mother   . Anxiety disorder Mother   . Cirrhosis Father     deceased age 58  . Alcohol abuse Father   . Liver cancer Cousin     age 45, deceased  . Drug abuse Cousin   . ADD / ADHD Son   . Anxiety disorder Sister   . Dementia Maternal Grandfather   . Colon cancer Other     aunt, deceased age 38  . Breast cancer Other     aunt, deceased age 9  .  Bipolar disorder Neg Hx   . Depression Neg Hx   . OCD Neg Hx   . Paranoid behavior Neg Hx   . Schizophrenia Neg Hx   . Seizures Neg Hx   . Sexual abuse Neg Hx   . Physical abuse Neg Hx     Social History   Social History  . Marital status: Married    Spouse name: N/A  . Number of children: 2  . Years of education: college   Occupational History  . student at [employer]  .  Disabled   Social History Main Topics  . Smoking status: Never Smoker  . Smokeless tobacco: Never Used  . Alcohol use No     Comment: occasionally  . Drug use: No  . Sexual activity: Not Currently    Birth control/ protection: Surgical   Other Topics Concern  . Not on file   Social History Narrative  . No narrative on file    Review of Systems: See HPI, otherwise negative ROS  Physical Exam: BP 124/75   Pulse 77   Temp 98 F (36.7 C) (Oral)   Ht 5\' 3"  (1.6 m)   Wt (!) 300 lb 6.4 oz (136.3 kg)   BMI 53.21 kg/m  General:   Alert,   pleasant and cooperative in NAD Skin:  Intact without significant lesions or rashes. Eyes:  Sclera clear, no icterus.   Conjunctiva pink. Ears:  Normal auditory acuity. Nose:  No deformity, discharge,  or lesions. Mouth:  No deformity or lesions. Neck:  Supple; no masses or thyromegaly. No significant  cervical adenopathy. Lungs:  Clear throughout to auscultation.   No wheezes, crackles, or rhonchi. No acute distress. Heart:  Regular rate and rhythm; no murmurs, clicks, rubs,  or gallops. Abdomen: Significantly obese. Positive bowel sounds soft nontender without obvious mass or organomegaly. No succussion splash.  Impression:  Pleasant morbidly obese 56 year old lady. Reflux symptoms fairly well controlled with twice a day PPI therapy. Occasional nausea without vomiting. Chronic constipation, in part, opioid induced.  I would like to see her achieve at least 3 bowel movements weekly or 1 every other day. I feel that it would be best to continue Linzess and  escalate dosing of MiraLAX.  Recommendations:  Continue Protonix 40 mg twice daily  Take miralax each morning; take a second dose in the evening if no bowel movement on any given day  Continue Linzess 290 daily  Office visit in 6 months  Keep appointment with Dr. Lucia Gaskins.        Notice: This dictation was prepared with Dragon dictation along with smaller phrase technology. Any transcriptional errors that result from this process are unintentional and may not be corrected upon review.

## 2016-07-28 ENCOUNTER — Ambulatory Visit: Payer: Self-pay | Admitting: Orthopaedic Surgery

## 2016-08-16 ENCOUNTER — Encounter (HOSPITAL_COMMUNITY): Payer: Self-pay | Admitting: *Deleted

## 2016-08-18 ENCOUNTER — Ambulatory Visit (INDEPENDENT_AMBULATORY_CARE_PROVIDER_SITE_OTHER): Payer: Medicare Other | Admitting: Orthopaedic Surgery

## 2016-08-18 ENCOUNTER — Encounter: Payer: Self-pay | Admitting: Orthopaedic Surgery

## 2016-08-18 VITALS — BP 139/85 | HR 73 | Ht 63.0 in | Wt 293.0 lb

## 2016-08-18 DIAGNOSIS — M25561 Pain in right knee: Secondary | ICD-10-CM

## 2016-08-18 DIAGNOSIS — G8929 Other chronic pain: Secondary | ICD-10-CM

## 2016-08-18 DIAGNOSIS — M25562 Pain in left knee: Secondary | ICD-10-CM

## 2016-08-18 MED ORDER — TRAMADOL HCL 50 MG PO TABS: 50.0000 mg | ORAL_TABLET | Freq: Four times a day (QID) | ORAL | 3 refills | Status: DC | PRN

## 2016-08-18 NOTE — Progress Notes (Signed)
Patient DX:[name], female DOB:[birth year], 56 y.o. MVE:[id number]  Chief Complaint  Patient presents with  . Follow-up    Right knee pain    HPI  [name] is a 56 y.o. female who has bilateral knee pain.  She is better. She has been going to the gym and being more active.  She is swimming. She has less pain.  She has swelling of the knees but no giving way, no locking, no redness.  She has no new trauma. HPI  Body mass index is 51.9 kg/m.  ROS  Review of Systems  Constitutional:       Patient has Diabetes Mellitus. Patient has hypertension. Patient does not have COPD or shortness of breath. Patient has BMI > 35. Patient does not have current smoking history.  HENT: Negative for congestion.   Respiratory: Positive for cough. Negative for shortness of breath.   Endocrine: Positive for cold intolerance.  Musculoskeletal: Positive for arthralgias, gait problem and joint swelling.  Allergic/Immunologic: Positive for environmental allergies.  Neurological: Positive for headaches.  Psychiatric/Behavioral: The patient is nervous/anxious.     Past Medical History:  Diagnosis Date  . Adenomatous polyp 2009  . Anemia 2003 after surgery needed 1 unit blood  . Anxiety   . Arthritis   . Bipolar 1 disorder ([place])   . Constipation   . Depression   . Diabetes mellitus   . Diabetes mellitus, type II ([place])   . Diverticula of colon 2009  . Generalized headaches   . GERD (gastroesophageal reflux disease)   . History of hiatal hernia    small  . Hypertension   . Migraine   . Obesity   . Pelvic floor dysfunction    abnormal anorectal [facility]ometry at [facility] in 2009  . Plantar fasciitis both feet  . Psoriasis   . Sleep apnea    cpap setting of 3.5    Past Surgical History:  Procedure Laterality Date  . ABDOMINAL HYSTERECTOMY     compete  . COLON RESECTION    03/30/2002   with end-colostomy and Hartmann's pouch  . COLON SURGERY     complicated diverticulitis  requiring sigmoid resection with colostomy and subsequent takedown  . COLONOSCOPY  12/11/2007   Dr. Gala Romney- marginal prep, normal rectum pancolonic diverticula, adenomatous polyp  . COLONOSCOPY  08/28/2003      Wide open colonic anastomosis/Scattered diverticula noted throughout colon/ Small external hemorrhoids  . COLONOSCOPY  04/2012   [facility]: hyperplastic polyps, diverticulosis, ileocolonic anastomosis.  . COLONOSCOPY WITH PROPOFOL N/A 06/07/2016   Procedure: COLONOSCOPY WITH PROPOFOL;  Surgeon: Daneil Dolin, MD;  [facility];  Service: Endoscopy;  Laterality: N/A;  7:30 am  . COLOSTOMY CLOSURE  07/10/2002  . FLEXIBLE SIGMOIDOSCOPY  01/20/2012   RMR: incomplete/attempted colonoscopy. Inadequate prep precluded examination  . HEEL SPUR SURGERY Left 09/11/2013   both have been done  . KNEE ARTHROSCOPY  02/04/2004    left knee/partial medial meniscectomy.  Marland Kitchen KNEE SURGERY     3 arthroscopic  2 on left 1 on right  . LAPAROSCOPIC GASTRIC BANDING  2009  . LAPAROSCOPIC SALPINGOOPHERECTOMY  03/30/2002  . TUBAL LIGATION      Family History  Problem Relation Age of Onset  . Ovarian cancer Mother   . Heart disease Mother   . Anxiety disorder Mother   . Cirrhosis Father     deceased age 65  . Alcohol abuse Father   . Liver cancer Cousin     age  69, deceased  . Drug abuse Cousin   . ADD / ADHD Son   . Anxiety disorder Sister   . Dementia Maternal Grandfather   . Colon cancer Other     aunt, deceased age 65  . Breast cancer Other     aunt, deceased age 27  . Bipolar disorder Neg Hx   . Depression Neg Hx   . OCD Neg Hx   . Paranoid behavior Neg Hx   . Schizophrenia Neg Hx   . Seizures Neg Hx   . Sexual abuse Neg Hx   . Physical abuse Neg Hx     Social History Social History  Substance Use Topics  . Smoking status: Never Smoker  . Smokeless tobacco: Never Used  . Alcohol use 0.0 oz/week     Comment: occasionally wine    Allergies  Allergen Reactions  . Bactrim  Itching, Nausea And Vomiting and Other (See Comments)    Redness  . Neurontin [Gabapentin] Palpitations and Other (See Comments)    confused    Current Outpatient Prescriptions  Medication Sig Dispense Refill  . Albiglutide (TANZEUM) 50 MG PEN Inject 50 mg into the skin 2 (two) times daily.    Marland Kitchen ALPRAZolam (XANAX) 1 MG tablet Take 1 tablet (1 mg total) by mouth 2 (two) times daily as needed for anxiety. (Patient taking differently: Take 1 mg by mouth 3 (three) times daily as needed for anxiety. ) 180 tablet 2  . ARIPiprazole (ABILIFY) 10 MG tablet Take 10 mg by mouth daily.    . carbamazepine (EQUETRO) 200 MG CP12 12 hr capsule Take 1 capsule (200 mg total) by mouth daily. (Patient not taking: Reported on 08/18/2016) 90 capsule 2  . cycloSPORINE (RESTASIS) 0.05 % ophthalmic emulsion Place 1 drop into both eyes 2 (two) times daily.     Marland Kitchen escitalopram (LEXAPRO) 20 MG tablet Take 1 tablet (20 mg total) by mouth daily. (Patient taking differently: Take 20 mg by mouth every evening. ) 90 tablet 2  . fluticasone (FLONASE) 50 MCG/ACT nasal spray Place 2 sprays into the nose daily as needed. Allergies    . linaclotide (LINZESS) 290 MCG CAPS capsule Take 1 capsule (290 mcg total) by mouth daily. (Patient taking differently: Take 290 mcg by mouth daily as needed (constipation). ) 90 capsule 3  . lisinopril (PRINIVIL,ZESTRIL) 10 MG tablet Take 10 mg by mouth daily.    Marland Kitchen loratadine (CLARITIN) 10 MG tablet Take 10 mg by mouth daily.    . meclizine (ANTIVERT) 25 MG tablet Take 25 mg by mouth daily as needed for dizziness.   2  . meloxicam (MOBIC) 15 MG tablet Take 1 tablet (15 mg total) by mouth daily. 30 tablet 5  . metFORMIN (GLUCOPHAGE) 500 MG tablet Take 500 mg by mouth 2 (two) times daily.    . methocarbamol (ROBAXIN) 500 MG tablet Take 500 mg by mouth 3 (three) times daily as needed for muscle spasms.     . Multiple Vitamin (MULTIVITAMIN PO) Take 1 tablet by mouth daily.     Marland Kitchen oxyCODONE-acetaminophen  (PERCOCET/ROXICET) 5-325 MG tablet Take 1 tablet by mouth every 6 (six) hours as needed for moderate pain or severe pain (Must last 30 days.Do not drive or operate machinery while taking this medicine). (Patient taking differently: Take 1 tablet by mouth 2 (two) times daily as needed for moderate pain or severe pain (Must last 30 days.Do not drive or operate machinery while taking this medicine). ) 110 tablet 0  .  pantoprazole (PROTONIX) 40 MG tablet Take 1 tablet (40 mg total) by mouth 2 (two) times daily. 180 tablet 3  . polyethylene glycol (MIRALAX / GLYCOLAX) packet Take 1 teaspoonful every morning    . Propylene Glycol-Glycerin (SOOTHE) 0.6-0.6 % SOLN Place 1 drop into both eyes 2 (two) times daily.    . Psyllium (METAMUCIL PO) Take 1 tablespoon twice daily    . rizatriptan (MAXALT-MLT) 10 MG disintegrating tablet Take 10 mg by mouth as needed for migraine. May repeat in 2 hours if needed    . Sod Picosulfate-Mag Ox-Cit Acd 10-3.5-12 MG-GM-GM PACK Take 1 Container by mouth as directed. (Patient not taking: Reported on 08/18/2016) 1 each 0  . traMADol (ULTRAM) 50 MG tablet Take 1 tablet (50 mg total) by mouth every 6 (six) hours as needed. 60 tablet 3  . traZODone (DESYREL) 50 MG tablet Take 1 tablet (50 mg total) by mouth at bedtime. (Patient taking differently: Take 50 mg by mouth at bedtime as needed for sleep. ) 90 tablet 2   No current facility-administered medications for this visit.      Physical Exam  Blood pressure 139/85, pulse 73, height 5\' 3"  (1.6 m), weight 293 lb (132.9 kg).  Constitutional: overall normal hygiene, normal nutrition, well developed, normal grooming, normal body habitus. Assistive device:none  Musculoskeletal: gait and station Limp right, muscle tone and strength are normal, no tremors or atrophy is present.  .  Neurological: coordination overall normal.  Deep tendon reflex/nerve stretch intact.  Sensation normal.  Cranial nerves II-XII intact.   Skin:    Normal overall no scars, lesions, ulcers or rashes. No psoriasis.  Psychiatric: Alert and oriented x 3.  Recent memory intact, remote memory unclear.  Normal mood and affect. Well groomed.  Good eye contact.  Cardiovascular: overall no swelling, no varicosities, no edema bilaterally, normal temperatures of the legs and arms, no clubbing, cyanosis and good capillary refill.  Lymphatic: palpation is normal.  The right lower extremity is examined:  Inspection:  Thigh:  Non-tender and no defects  Knee has swelling 1+ effusion.                        Joint tenderness is present                        Patient is tender over the medial joint line  Lower Leg:  Has normal appearance and no tenderness or defects  Ankle:  Non-tender and no defects  Foot:  Non-tender and no defects Range of Motion:  Knee:  Range of motion is: 0-105                        Crepitus is  present  Ankle:  Range of motion is normal. Strength and Tone:  The right lower extremity has normal strength and tone. Stability:  Knee:  The knee is stable.  Ankle:  The ankle is stable.    The patient has been educated about the nature of the problem(s) and counseled on treatment options.  The patient appeared to understand what I have discussed and is in agreement with it.  Encounter Diagnoses  Name Primary?  . Chronic pain of left knee Yes  . Chronic pain of right knee   . Morbid obesity due to excess calories [facility])     PLAN Call if any problems.  Precautions discussed.  Continue current medications.   Return  to clinic 2 months   I have reviewed the [place] web site prior to prescribing narcotic medicine for this patient.  Electronically Signed Sanjuana Kava, MD 4/11/20189:58 AM

## 2016-08-22 NOTE — H&P (Signed)
[name]  Location: [facility] Patient #: [id number] DOB: November 11, 1960 Married / Language: English / Race: White Female  History of Present Illness   The patient is a 56 year old female who presents with a complaint of management of lap band.   The PCP is Dr. Sharilyn Sites.  She comes by herself.  It looks that I last saw [name] in 2014. [name] last saw Mr. [name] in May and June of 2017 - where he removed fluid and then replaced fluid.  She comes in with left-sided abdominal pain has been going on since around November 2017. On 22 March 2016 she had a CT scan that showed an abdominal wall hernia about 6 x 11 cm, but no other significant intra-abdominal change. Her lap band was in good position. She takes an antacid before each meal. She has eats slowly, because she can't throw up. Because of the pain, she can't lay on her left side. She takes meloxicam for her knees. She has seen Dr. Celedonio Miyamoto, but needs to lose 40 pounds prior to any surgery. I think that she is tiding her knee pain along with injections. She has some chronic back pain, in part due to her weight. Dr. Buford Dresser did a colonoscopy in Jan 2018 - she had some sigmoid diverticulosis. She is on Miralax and Linzess for constipation (this may be in part because of narcotics). She is also on Protonix.  She said that she is under stress. She lost her dog about one week ago. Her brother has Stage IV head and neck cancer. She said that Dr. Buford Dresser was worried about an erosion of the lap band, because of her pain and use of NSAID's. But she has not had an upper endoscopy. [Note: I do not see this mentioned in his note of 07/27/2016]   Plan: 1) Will not adjust the lap band today, 2) will schedule upper endoscopy to make sure there is no evidence of erosion, 3) Nutrtion consult to work on weight loss with lap band  Past Medical History: 1. History of lap band placement  - APL - 03/19/2008 Initial weight - 288, BMI - 45.32 Last seen by [name] 11/08/2015. Her lowest weight was 211 on 10/31/2009  2. History of reflux - still bothers her 3. Diabetes mellitus, Takes metformin, Trulicity q week 4. History of sleep apnea On nasal CPAP 5. History of anxiety and depression Bipolar - sees Dr. Kalman Shan at [place] 6. Bilateral knee pain - right > left Sees Dr. Celedonio Miyamoto in [place] 7. Migraines - better on Topamax Sees Dr. Jannifer Franklin 8. History of colon resection for diverticulitis - required colostomy and take down. 2003 - Dr. Tamala Julian 9. Chronic disability - since around 2010. 10. Takes oxycodone - 5 mg BID 11. She has neuropathy in her feet.  Social History: Married. Has 2 sons - 60 and 53 yo. On chronic disability   Allergies Nance Pear, CMA; 07/30/2016 2:53 PM) Codeine Phosphate *ANALGESICS - OPIOID*  Bactrim *ANTI-INFECTIVE AGENTS - MISC.*  Neurontin *ANTICONVULSANTS*  Allergies Reconciled   Medication History Nance Pear, CMA; 07/30/2016 4:33 PM) OneTouch Delica Lancets 29J Active. Lisinopril (10MG  Tablet, Oral daily) Active. Xanax XR (1MG  Tablet ER 24HR, Oral AS needed) Active. Abilify (10MG  Tablet, Oral Daily) Active. Calcium Carb-Cholecalciferol (500-600MG -UNIT Tablet Chewable, Oral Daily) Active. Restasis (0.05% Emulsion, Ophthalmic) Active. Lexapro (10MG  Tablet, Oral Daily) Active. Byetta 10 MCG Pen (10MCG/0.04ML Soln Pen-inj, Subcutaneous Daily) Active. Flonase Allergy Relief (50MCG/ACT Suspension, Nasal AS needed) Active. Hydrocodone-Acetaminophen (  5-325MG  Tablet, Oral AS needed) Active. Linzess (290MCG Capsule, Oral Daily) Active. Lisinopril (5MG  Tablet, Oral Daily) Active. Claritin Reditabs (10MG  Tablet Disint, Oral AS needed) Active. Amitiza (24MCG Capsule, Oral Daily) Active. MetFORMIN HCl ER (500MG  Tablet ER  24HR, Oral TWo times daily) Active. Multiple Vitamins (Oral Daily) Active. Protonix (40MG  Packet, Oral AS needed) Active. MiraLax (Oral) Active. Maxalt-MLT (10MG  Tablet Disint, Oral Daily) Active. Topamax (25MG  Tablet, Oral Daily) Active. Medications Reconciled  Vitals (Sade Bradford CMA; 07/30/2016 2:54 PM) 07/30/2016 2:53 PM Weight: 303.6 lb Height: 67in Body Surface Area: 2.41 m Body Mass Index: 47.55 kg/m  Temp.: 98.57F  Pulse: 94 (Regular)  BP: 136/88 (Sitting, Left Arm, Standard)    Physical Exam  General: Morbidly obese WF alert. HEENT: Normal. Pupils equal.  Neck: Supple. No mass. No thyroid mass.  Lymph Nodes: No supraclavicular or cervical nodes.  Lungs: Clear to auscultation and symmetric breath sounds. Heart: RRR. No murmur or rub.  Abdomen: Soft. No mass. No tenderness. No hernia. Normal bowel sounds.  She points to her left abdomen/flank as to where her painis. She has a port in the RUQ. She has a midline scar/Left sided scar (old ostomy site)/pfannenstiel scar I have trouble feeling the hernia. Rectal: Not done.  Extremities: Good strength and ROM in upper and lower extremities. She moves slowly because of her weight. She takes some time to get on the exam table.  Neurologic: Grossly intact to motor and sensory function. Psychiatric: Has normal mood and affect. Behavior is normal.   Assessment & Plan  1.  MORBID OBESITY WITH BMI OF 45.0-49.9, ADULT (E66.01) 2.  HISTORY OF LAPAROSCOPIC ADJUSTABLE GASTRIC BANDING (Z98.84)  Lap band placement - APL - 03/19/2008 Initial weight - 288, BMI - 45.32  Plan:  1) Will schedule EGD  2) Nutrtion consult to work on weight loss with lap band  3.  VENTRAL HERNIA WITHOUT OBSTRUCTION OR GANGRENE (K43.9)  Impression: The CT scan on 03/22/2016 shows a 6 x 11 cm defect in her lower abdomen below the umbilicus.  4. History of reflux - still bothers  her 5. Diabetes mellitus, Takes metformin, Trulicity q week 6. History of sleep apnea On nasal CPAP 7. History of anxiety and depression Bipolar - sees Dr. Kalman Shan at [place] 8. Bilateral knee pain - right > left Sees Dr. Celedonio Miyamoto in [place] 9. Migraines - better on Topamax Sees Dr. Jannifer Franklin 10. History of colon resection for diverticulitis - required colostomy and take down. 2003 - Dr. Tamala Julian 11. Chronic disability - since around 2010. 12. Takes oxycodone - 5 mg BID 13. She has neuropathy in her feet.   Alphonsa Overall, MD, [facility] Pager: [phone] Office phone:  [phone]

## 2016-08-23 ENCOUNTER — Ambulatory Visit (HOSPITAL_COMMUNITY): Payer: Medicare Other | Admitting: Certified Registered Nurse Anesthetist

## 2016-08-23 ENCOUNTER — Encounter (HOSPITAL_COMMUNITY)
Admission: RE | Disposition: A | Discharge status: Home / Self Care | Payer: Self-pay | Source: Ambulatory Visit | Attending: Surgery

## 2016-08-23 ENCOUNTER — Ambulatory Visit (HOSPITAL_COMMUNITY)
Admission: RE | Admit: 2016-08-23 | Discharge: 2016-08-23 | Disposition: A | Discharge status: Home / Self Care | Payer: Medicare Other | Source: Ambulatory Visit | Attending: Surgery | Admitting: Surgery

## 2016-08-23 ENCOUNTER — Encounter (HOSPITAL_COMMUNITY): Payer: Self-pay | Admitting: *Deleted

## 2016-08-23 DIAGNOSIS — K219 Gastro-esophageal reflux disease without esophagitis: Secondary | ICD-10-CM | POA: Diagnosis not present

## 2016-08-23 DIAGNOSIS — Z888 Allergy status to other drugs, medicaments and biological substances status: Secondary | ICD-10-CM | POA: Diagnosis not present

## 2016-08-23 DIAGNOSIS — Z9884 Bariatric surgery status: Secondary | ICD-10-CM | POA: Insufficient documentation

## 2016-08-23 DIAGNOSIS — K439 Ventral hernia without obstruction or gangrene: Secondary | ICD-10-CM | POA: Diagnosis not present

## 2016-08-23 DIAGNOSIS — K5900 Constipation, unspecified: Secondary | ICD-10-CM | POA: Diagnosis not present

## 2016-08-23 DIAGNOSIS — G8929 Other chronic pain: Secondary | ICD-10-CM | POA: Diagnosis not present

## 2016-08-23 DIAGNOSIS — Z6841 Body Mass Index (BMI) 40.0 and over, adult: Secondary | ICD-10-CM | POA: Insufficient documentation

## 2016-08-23 DIAGNOSIS — E119 Type 2 diabetes mellitus without complications: Secondary | ICD-10-CM | POA: Insufficient documentation

## 2016-08-23 DIAGNOSIS — Z9049 Acquired absence of other specified parts of digestive tract: Secondary | ICD-10-CM | POA: Insufficient documentation

## 2016-08-23 DIAGNOSIS — G43909 Migraine, unspecified, not intractable, without status migrainosus: Secondary | ICD-10-CM | POA: Insufficient documentation

## 2016-08-23 DIAGNOSIS — G629 Polyneuropathy, unspecified: Secondary | ICD-10-CM | POA: Insufficient documentation

## 2016-08-23 DIAGNOSIS — F329 Major depressive disorder, single episode, unspecified: Secondary | ICD-10-CM | POA: Diagnosis not present

## 2016-08-23 DIAGNOSIS — G4730 Sleep apnea, unspecified: Secondary | ICD-10-CM | POA: Diagnosis not present

## 2016-08-23 DIAGNOSIS — F419 Anxiety disorder, unspecified: Secondary | ICD-10-CM | POA: Insufficient documentation

## 2016-08-23 DIAGNOSIS — K5730 Diverticulosis of large intestine without perforation or abscess without bleeding: Secondary | ICD-10-CM | POA: Insufficient documentation

## 2016-08-23 DIAGNOSIS — R1012 Left upper quadrant pain: Secondary | ICD-10-CM | POA: Diagnosis present

## 2016-08-23 DIAGNOSIS — Z885 Allergy status to narcotic agent status: Secondary | ICD-10-CM | POA: Insufficient documentation

## 2016-08-23 DIAGNOSIS — M25562 Pain in left knee: Secondary | ICD-10-CM | POA: Diagnosis not present

## 2016-08-23 DIAGNOSIS — Z79899 Other long term (current) drug therapy: Secondary | ICD-10-CM | POA: Diagnosis not present

## 2016-08-23 DIAGNOSIS — M25561 Pain in right knee: Secondary | ICD-10-CM | POA: Diagnosis not present

## 2016-08-23 DIAGNOSIS — I10 Essential (primary) hypertension: Secondary | ICD-10-CM | POA: Insufficient documentation

## 2016-08-23 HISTORY — DX: Essential (primary) hypertension: I10

## 2016-08-23 HISTORY — DX: Personal history of other diseases of the digestive system: Z87.19

## 2016-08-23 HISTORY — DX: Gastro-esophageal reflux disease without esophagitis: K21.9

## 2016-08-23 HISTORY — DX: Anemia, unspecified: D64.9

## 2016-08-23 HISTORY — PX: ESOPHAGOGASTRODUODENOSCOPY (EGD) WITH PROPOFOL: SHX5813

## 2016-08-23 LAB — GLUCOSE, CAPILLARY: Glucose-Capillary: 149 mg/dL — ABNORMAL HIGH (ref 65–99)

## 2016-08-23 SURGERY — ESOPHAGOGASTRODUODENOSCOPY (EGD) WITH PROPOFOL
Anesthesia: Monitor Anesthesia Care

## 2016-08-23 MED ORDER — LIDOCAINE 2% (20 MG/ML) 5 ML SYRINGE
INTRAMUSCULAR | Status: DC | PRN
  Administered 2016-08-23: 100 mg via INTRAVENOUS

## 2016-08-23 MED ORDER — ONDANSETRON HCL 4 MG/2ML IJ SOLN
INTRAMUSCULAR | Status: DC | PRN
  Administered 2016-08-23: 4 mg via INTRAVENOUS

## 2016-08-23 MED ORDER — LACTATED RINGERS IV SOLN
INTRAVENOUS | Status: DC
  Administered 2016-08-23: 08:00:00 via INTRAVENOUS

## 2016-08-23 MED ORDER — ONDANSETRON HCL 4 MG/2ML IJ SOLN
INTRAMUSCULAR | Status: AC
  Filled 2016-08-23: qty 2

## 2016-08-23 MED ORDER — LIDOCAINE 2% (20 MG/ML) 5 ML SYRINGE
INTRAMUSCULAR | Status: AC
  Filled 2016-08-23: qty 5

## 2016-08-23 MED ORDER — PROPOFOL 500 MG/50ML IV EMUL
INTRAVENOUS | Status: DC | PRN
  Administered 2016-08-23: 200 ug/kg/min via INTRAVENOUS

## 2016-08-23 MED ORDER — PROPOFOL 10 MG/ML IV BOLUS
INTRAVENOUS | Status: AC
  Filled 2016-08-23: qty 40

## 2016-08-23 MED ORDER — PROPOFOL 10 MG/ML IV BOLUS
INTRAVENOUS | Status: DC | PRN
  Administered 2016-08-23: 20 mg via INTRAVENOUS
  Administered 2016-08-23: 50 mg via INTRAVENOUS

## 2016-08-23 SURGICAL SUPPLY — 14 items

## 2016-08-23 NOTE — Anesthesia Preprocedure Evaluation (Signed)
Anesthesia Evaluation  Patient identified by MRN, date of birth, ID band Patient awake    Reviewed: Allergy & Precautions, NPO status , Patient's Chart, lab work & pertinent test results  Airway Mallampati: II  TM Distance: >3 FB Neck ROM: Full    Dental  (+) Teeth Intact, Dental Advisory Given   Pulmonary    breath sounds clear to auscultation       Cardiovascular hypertension,  Rhythm:Regular Rate:Normal     Neuro/Psych    GI/Hepatic   Endo/Other  diabetes  Renal/GU      Musculoskeletal   Abdominal (+) + obese,   Peds  Hematology   Anesthesia Other Findings   Reproductive/Obstetrics                             Anesthesia Physical Anesthesia Plan  ASA: III  Anesthesia Plan: MAC   Post-op Pain Management:    Induction: Intravenous  Airway Management Planned: Natural Airway and Nasal Cannula  Additional Equipment:   Intra-op Plan:   Post-operative Plan:   Informed Consent: I have reviewed the patients History and Physical, chart, labs and discussed the procedure including the risks, benefits and alternatives for the proposed anesthesia with the patient or authorized representative who has indicated his/her understanding and acceptance.     Plan Discussed with: CRNA and Anesthesiologist  Anesthesia Plan Comments:         Anesthesia Quick Evaluation

## 2016-08-23 NOTE — Interval H&P Note (Signed)
History and Physical Interval Note:  08/23/2016 8:30 AM  [name]  has presented today for surgery, with the diagnosis of Abdominal pain, lap band  The various methods of treatment have been discussed with the patient and family.   She said that most of her left sided pain is better.  She does have some resistance. Her husband is here with her.  After consideration of risks, benefits and other options for treatment, the patient has consented to  Procedure(s): ESOPHAGOGASTRODUODENOSCOPY (EGD) WITH PROPOFOL (N/A) as a surgical intervention .  The patient's history has been reviewed, patient examined, no change in status, stable for surgery.  I have reviewed the patient's chart and labs.  Questions were answered to the patient's satisfaction.     [name] H

## 2016-08-23 NOTE — Anesthesia Postprocedure Evaluation (Addendum)
Anesthesia Post Note  Patient: [name]  Procedure(s) Performed: Procedure(s) (LRB): ESOPHAGOGASTRODUODENOSCOPY (EGD) WITH PROPOFOL (N/A)  Patient location during evaluation: Endoscopy Anesthesia Type: MAC Level of consciousness: awake, awake and alert and oriented Pain management: pain level controlled Vital Signs Assessment: post-procedure vital signs reviewed and stable Respiratory status: spontaneous breathing and respiratory function stable Cardiovascular status: blood pressure returned to baseline Anesthetic complications: no       Last Vitals:  Vitals:   08/23/16 0900 08/23/16 0920  BP: (!) 161/97 (!) 141/83  Pulse: 60 (!) 57  Resp: (!) 26 (!) 23  Temp:      Last Pain:  Vitals:   08/23/16 0854  TempSrc: Oral                 [name]

## 2016-08-23 NOTE — Op Note (Signed)
08/23/2016  8:47 AM  PATIENT:  [name], 56 y.o., female, MRN: [id number]  PREOP DIAGNOSIS:  Left upper quadrant pain  POSTOP DIAGNOSIS:   Normal esophagus, stomach, and duodenal anatomy.  No evidence of erosion  PROCEDURE:  Esophagogastroduedonoscopy  SURGEON:   Alphonsa Overall, M.D.  ANESTHESIA:   IV sedation per anesthesia - Virgia Land, CRNA  INDICATIONS FOR PROCEDURE:  [name] is a 56 y.o. (DOB: 10/31/1960)  white female whose primary care physician is Purvis Kilts, MD and comes for upper endoscopy to evaluate her lap band.   She had a lap band placement - APL - 03/19/2008 by Dr. Keturah Barre. Alisea Matte.  Unfortunately, she has never had significant weight loss.  And has recently developed LUQ pain of unclear etiology.   She has had left upper quadrant pain.  A CT scan of her abdomen showed a lower abdominal wall hernia, but no cause for pain in her LUQ.   The indications and risks of the endoscopy were explained to the patient.  The risks include, but are not limited to, perforation, bleeding, or injury to the bowel.  PROCEDURE:  The patient was monitored with a pulse oximetry, BP cuff, and EKG.  The patient has nasal O2 flowing during the procedure.   Anesthesia was supervised by a nurse anesthetist.   Findings include:   Esophagus:   Normal   GE junction at:  40 cm   Lap band:  In place.  No evidence of erosion.  The opening to the lap band is wide open and looks like it could be tightened to provide some resistance.   Stomach: Normal.  Cardia and antrum look normal.   Duodenum:   Normal to the second portion of the duodenum.  PLAN:  She has an appt to see the nutritionist.  Then to see me back in 6 to 8 weeks to consider adjusting the lap band.  Alphonsa Overall, MD, [facility] Pager: [phone] Office phone:  [phone]

## 2016-08-23 NOTE — Discharge Instructions (Signed)
[facility] - DISCHARGE INSTRUCTIONS TO PATIENT  Activity:  Driving - May drive tomorrow.  Diet:  As tolerated  Follow up appointment:  Call Dr. Pollie Friar office [facility]) at [phone] for an appointment in 6 to 8 weeks.  Medications and dosages:  Resume your home medications.  Call Dr. Lucia Gaskins or his office  [phone]) if you have:  Persistent nausea and vomiting,  Severe uncontrolled pain,  Difficulty breathing, headache or visual disturbances,  Any other questions or concerns you may have after discharge.  In an emergency, call 911 or go to an Emergency Department at a nearby hospital.

## 2016-08-23 NOTE — Transfer of Care (Signed)
Immediate Anesthesia Transfer of Care Note  Patient: [name]  Procedure(s) Performed: Procedure(s): ESOPHAGOGASTRODUODENOSCOPY (EGD) WITH PROPOFOL (N/A)  Patient Location: PACU  Anesthesia Type:MAC  Level of Consciousness:  sedated, patient cooperative and responds to stimulation  Airway & Oxygen Therapy:Patient Spontanous Breathing and Patient connected to face mask oxgen  Post-op Assessment:  Report given to PACU RN and Post -op Vital signs reviewed and stable  Post vital signs:  Reviewed and stable  Last Vitals:  Vitals:   08/23/16 0757 08/23/16 0854  BP: (!) 160/91 (!) 160/92  Pulse: 71 (!) 58  Resp: 12 19  Temp: 36.7 C 87.2 C    Complications: No apparent anesthesia complications

## 2016-08-30 ENCOUNTER — Ambulatory Visit: Payer: Self-pay | Admitting: Gastroenterology

## 2016-09-02 ENCOUNTER — Encounter (HOSPITAL_COMMUNITY): Payer: Self-pay | Admitting: Psychiatry

## 2016-09-02 ENCOUNTER — Ambulatory Visit (INDEPENDENT_AMBULATORY_CARE_PROVIDER_SITE_OTHER): Payer: Medicare Other | Admitting: Psychiatry

## 2016-09-02 VITALS — BP 136/88 | HR 85 | Ht 66.0 in | Wt 291.0 lb

## 2016-09-02 DIAGNOSIS — F1021 Alcohol dependence, in remission: Secondary | ICD-10-CM | POA: Diagnosis not present

## 2016-09-02 DIAGNOSIS — Z810 Family history of intellectual disabilities: Secondary | ICD-10-CM | POA: Diagnosis not present

## 2016-09-02 DIAGNOSIS — Z818 Family history of other mental and behavioral disorders: Secondary | ICD-10-CM | POA: Diagnosis not present

## 2016-09-02 DIAGNOSIS — Z811 Family history of alcohol abuse and dependence: Secondary | ICD-10-CM | POA: Diagnosis not present

## 2016-09-02 DIAGNOSIS — F3162 Bipolar disorder, current episode mixed, moderate: Secondary | ICD-10-CM

## 2016-09-02 DIAGNOSIS — Z813 Family history of other psychoactive substance abuse and dependence: Secondary | ICD-10-CM

## 2016-09-02 MED ORDER — ALPRAZOLAM 1 MG PO TABS: 1.0000 mg | ORAL_TABLET | Freq: Two times a day (BID) | ORAL | 2 refills | Status: DC | PRN

## 2016-09-02 MED ORDER — CARBAMAZEPINE ER 200 MG PO CP12: 200.0000 mg | ORAL_CAPSULE | Freq: Two times a day (BID) | ORAL | 2 refills | Status: DC

## 2016-09-02 MED ORDER — ESCITALOPRAM OXALATE 20 MG PO TABS: 20.0000 mg | ORAL_TABLET | Freq: Every day | ORAL | 2 refills | Status: DC

## 2016-09-02 MED ORDER — ARIPIPRAZOLE 5 MG PO TABS: 5.0000 mg | ORAL_TABLET | Freq: Every day | ORAL | 2 refills | Status: DC
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
## 2016-09-29 ENCOUNTER — Encounter: Payer: Self-pay | Admitting: Orthopaedic Surgery

## 2016-09-29 ENCOUNTER — Ambulatory Visit (INDEPENDENT_AMBULATORY_CARE_PROVIDER_SITE_OTHER): Payer: Medicare Other | Admitting: Orthopaedic Surgery

## 2016-09-29 VITALS — Ht 63.0 in

## 2016-09-29 DIAGNOSIS — G8929 Other chronic pain: Secondary | ICD-10-CM | POA: Diagnosis not present

## 2016-09-29 DIAGNOSIS — M25561 Pain in right knee: Secondary | ICD-10-CM | POA: Diagnosis not present

## 2016-09-29 MED ORDER — TRAMADOL HCL 50 MG PO TABS: 50.0000 mg | ORAL_TABLET | Freq: Four times a day (QID) | ORAL | 3 refills | Status: DC | PRN

## 2016-09-29 NOTE — Progress Notes (Signed)
CC:  I have pain of my right knee. I would like an injection.  The patient has chronic pain of the right knee.  There is no recent trauma.  There is no redness.  Injections in the past have helped.  The knee has no redness, has an effusion and crepitus present.  ROM of the right knee is 0-95.  Impression:  Chronic knee pain right  Return: 1 month  PROCEDURE NOTE:  The patient requests injections of the right knee , verbal consent was obtained.  The right knee was prepped appropriately after time out was performed.   Sterile technique was observed and injection of 1 cc of Depo-Medrol 40 mg with several cc's of plain xylocaine. Anesthesia was provided by ethyl chloride and a 20-gauge needle was used to inject the knee area. The injection was tolerated well.  A band aid dressing was applied.  The patient was advised to apply ice later today and tomorrow to the injection sight as needed.  I have reviewed the [place] web site prior to prescribing narcotic medicine for this patient.  Electronically Signed Sanjuana Kava, MD 5/23/20183:16 PM

## 2016-10-26 ENCOUNTER — Ambulatory Visit: Payer: Self-pay | Admitting: Orthopaedic Surgery

## 2016-11-21 ENCOUNTER — Encounter (HOSPITAL_COMMUNITY): Payer: Self-pay | Admitting: Emergency Medicine

## 2016-11-21 ENCOUNTER — Emergency Department (HOSPITAL_COMMUNITY): Payer: Medicare Other

## 2016-11-21 ENCOUNTER — Emergency Department (HOSPITAL_COMMUNITY)
Admission: EM | Admit: 2016-11-21 | Discharge: 2016-11-21 | Disposition: A | Discharge status: Home / Self Care | Payer: Medicare Other | Attending: Emergency Medicine | Admitting: Emergency Medicine

## 2016-11-21 DIAGNOSIS — I10 Essential (primary) hypertension: Secondary | ICD-10-CM | POA: Diagnosis not present

## 2016-11-21 DIAGNOSIS — R079 Chest pain, unspecified: Secondary | ICD-10-CM | POA: Insufficient documentation

## 2016-11-21 DIAGNOSIS — Z79899 Other long term (current) drug therapy: Secondary | ICD-10-CM | POA: Diagnosis not present

## 2016-11-21 DIAGNOSIS — E119 Type 2 diabetes mellitus without complications: Secondary | ICD-10-CM | POA: Diagnosis not present

## 2016-11-21 DIAGNOSIS — Z7984 Long term (current) use of oral hypoglycemic drugs: Secondary | ICD-10-CM | POA: Insufficient documentation

## 2016-11-21 DIAGNOSIS — M5441 Lumbago with sciatica, right side: Secondary | ICD-10-CM

## 2016-11-21 LAB — CBC WITH DIFFERENTIAL/PLATELET
Basophils Absolute: 0 10*3/uL (ref 0.0–0.1)
Basophils Relative: 0 %
EOS PCT: 3 %
Eosinophils Absolute: 0.3 10*3/uL (ref 0.0–0.7)
HCT: 45.3 % (ref 36.0–46.0)
Hemoglobin: 15.2 g/dL — ABNORMAL HIGH (ref 12.0–15.0)
LYMPHS ABS: 2.5 10*3/uL (ref 0.7–4.0)
LYMPHS PCT: 24 %
MCH: 29.2 pg (ref 26.0–34.0)
MCHC: 33.6 g/dL (ref 30.0–36.0)
MCV: 87.1 fL (ref 78.0–100.0)
MONO ABS: 0.7 10*3/uL (ref 0.1–1.0)
Monocytes Relative: 7 %
Neutro Abs: 7 10*3/uL (ref 1.7–7.7)
Neutrophils Relative %: 66 %
PLATELETS: 215 10*3/uL (ref 150–400)
RBC: 5.2 MIL/uL — ABNORMAL HIGH (ref 3.87–5.11)
RDW: 14.2 % (ref 11.5–15.5)
WBC: 10.6 10*3/uL — ABNORMAL HIGH (ref 4.0–10.5)

## 2016-11-21 LAB — COMPREHENSIVE METABOLIC PANEL
ALT: 34 U/L (ref 14–54)
ANION GAP: 12 (ref 5–15)
AST: 31 U/L (ref 15–41)
Albumin: 4 g/dL (ref 3.5–5.0)
Alkaline Phosphatase: 83 U/L (ref 38–126)
BUN: 20 mg/dL (ref 6–20)
CHLORIDE: 103 mmol/L (ref 101–111)
CO2: 27 mmol/L (ref 22–32)
CREATININE: 0.99 mg/dL (ref 0.44–1.00)
Calcium: 10 mg/dL (ref 8.9–10.3)
GFR calc non Af Amer: >60 mL/min (ref >60)
Glucose, Bld: 144 mg/dL — ABNORMAL HIGH (ref 65–99)
POTASSIUM: 4.1 mmol/L (ref 3.5–5.1)
Sodium: 142 mmol/L (ref 135–145)
Total Bilirubin: 0.7 mg/dL (ref 0.3–1.2)
Total Protein: 7.8 g/dL (ref 6.5–8.1)

## 2016-11-21 LAB — TROPONIN I

## 2016-11-21 MED ORDER — HYDROCODONE-ACETAMINOPHEN 5-325 MG PO TABS: 1.0000 | ORAL_TABLET | ORAL | 0 refills | Status: DC | PRN

## 2016-11-21 MED ORDER — ONDANSETRON 4 MG PO TBDP: 4.0000 mg | ORAL_TABLET | Freq: Three times a day (TID) | ORAL | 0 refills | Status: DC | PRN

## 2016-11-21 MED ORDER — DIPHENHYDRAMINE HCL 50 MG/ML IJ SOLN
25.0000 mg | Freq: Once | INTRAMUSCULAR | Status: AC
  Administered 2016-11-21: 25 mg via INTRAVENOUS
  Filled 2016-11-21: qty 1

## 2016-11-21 MED ORDER — METOCLOPRAMIDE HCL 5 MG/ML IJ SOLN
10.0000 mg | Freq: Once | INTRAMUSCULAR | Status: AC
  Administered 2016-11-21: 10 mg via INTRAVENOUS
  Filled 2016-11-21: qty 2

## 2016-11-21 MED ORDER — SODIUM CHLORIDE 0.9 % IV BOLUS (SEPSIS)
500.0000 mL | Freq: Once | INTRAVENOUS | Status: AC
  Administered 2016-11-21: 500 mL via INTRAVENOUS

## 2016-11-21 MED ORDER — SODIUM CHLORIDE 0.9 % IV BOLUS (SEPSIS): 500.0000 mL | Freq: Once | INTRAVENOUS | Status: DC

## 2016-11-21 MED ORDER — KETOROLAC TROMETHAMINE 30 MG/ML IJ SOLN
30.0000 mg | Freq: Once | INTRAMUSCULAR | Status: AC
  Administered 2016-11-21: 30 mg via INTRAVENOUS
  Filled 2016-11-21: qty 1

## 2016-11-21 MED ORDER — SODIUM CHLORIDE 0.9 % IV BOLUS (SEPSIS)
1000.0000 mL | Freq: Once | INTRAVENOUS | Status: AC
  Administered 2016-11-21: 1000 mL via INTRAVENOUS

## 2016-11-21 NOTE — Discharge Instructions (Signed)
Tests showed no life-threatening condition. Medication for pain and nausea. Increase fluids. Follow-up your primary care doctor.

## 2016-11-21 NOTE — ED Triage Notes (Signed)
Pt reports she has felt bad since having her lap band tightened x 2 weeks ago. Pt reports cp with dizziness and migraine ha since yesterday. Pt also reports lower back pain. Denies injury.

## 2016-11-23 NOTE — ED Provider Notes (Signed)
[facility] DEPT Provider Note   CSN: [id number] Arrival date & time: 11/21/16  0930     History   Chief Complaint Chief Complaint  Patient presents with  . Chest Pain    HPI [name] is a 56 y.o. female.  Patient presents with left chest pain under her breast since chest today with associated headache and dizziness. Status post lap band tightening 2 weeks ago. No crushing substernal chest pain, diaphoresis, dyspnea, nausea. Past medical history includes a colectomy followed by colostomy followed by reversal. Severity of symptoms is moderate. Nothing makes symptoms better or worse.      Past Medical History:  Diagnosis Date  . Adenomatous polyp 2009  . Anemia 2003 after surgery needed 1 unit blood  . Anxiety   . Arthritis   . Bipolar 1 disorder ([place])   . Constipation   . Depression   . Diabetes mellitus   . Diabetes mellitus, type II ([place])   . Diverticula of colon 2009  . Generalized headaches   . GERD (gastroesophageal reflux disease)   . History of hiatal hernia    small  . Hypertension   . Migraine   . Obesity   . Pelvic floor dysfunction    abnormal anorectal manometry at [facility] in 2009  . Plantar fasciitis both feet  . Psoriasis   . Sleep apnea    cpap setting of 3.5    Patient Active Problem List   Diagnosis Date Noted  . Hx of adenomatous colonic polyps 06/04/2016  . LLQ pain 12/31/2014  . Abdominal pain, RLQ (right lower quadrant) 04/24/2014  . OSA on CPAP 10/18/2013  . Alcohol abuse 01/01/2013  . Migraine without aura, with intractable migraine, so stated, without mention of status migrainosus 09/21/2012  . Memory loss 09/21/2012  . Lapband APL Nov 2009 06/21/2012  . Outbursts of anger 03/31/2012  . FH: colon cancer 11/03/2011  . Rectal bleed 11/03/2011  . Esophageal dysphagia 11/03/2011  . COLONIC POLYPS 10/10/2008  . DIABETES MELLITUS, TYPE II 09/02/2008  . OBESITY 09/02/2008  . ANXIETY NEUROSIS 09/02/2008  . Severe mood  disorder without psychotic features ([place]) 09/02/2008  . GERD 09/02/2008  . Constipation 09/02/2008  . OSTEOARTHRITIS 09/02/2008    Past Surgical History:  Procedure Laterality Date  . ABDOMINAL HYSTERECTOMY     compete  . COLON RESECTION    03/30/2002   with end-colostomy and Hartmann's pouch  . COLON SURGERY     complicated diverticulitis requiring sigmoid resection with colostomy and subsequent takedown  . COLONOSCOPY  12/11/2007   Dr. Gala Romney- marginal prep, normal rectum pancolonic diverticula, adenomatous polyp  . COLONOSCOPY  08/28/2003      Wide open colonic anastomosis/Scattered diverticula noted throughout colon/ Small external hemorrhoids  . COLONOSCOPY  04/2012   [facility]: hyperplastic polyps, diverticulosis, ileocolonic anastomosis.  . COLONOSCOPY WITH PROPOFOL N/A 06/07/2016   Procedure: COLONOSCOPY WITH PROPOFOL;  Surgeon: Daneil Dolin, MD;  Location: [facility] SUITE;  Service: Endoscopy;  Laterality: N/A;  7:30 am  . COLOSTOMY CLOSURE  07/10/2002  . ESOPHAGOGASTRODUODENOSCOPY (EGD) WITH PROPOFOL N/A 08/23/2016   Procedure: ESOPHAGOGASTRODUODENOSCOPY (EGD) WITH PROPOFOL;  Surgeon: Alphonsa Overall, MD;  Location: [facility] ENDOSCOPY;  Service: General;  Laterality: N/A;  . FLEXIBLE SIGMOIDOSCOPY  01/20/2012   RMR: incomplete/attempted colonoscopy. Inadequate prep precluded examination  . HEEL SPUR SURGERY Left 09/11/2013   both have been done  . KNEE ARTHROSCOPY  02/04/2004    left knee/partial medial meniscectomy.  Marland Kitchen KNEE SURGERY  3 arthroscopic  2 on left 1 on right  . LAPAROSCOPIC GASTRIC BANDING  2009  . LAPAROSCOPIC SALPINGOOPHERECTOMY  03/30/2002  . TUBAL LIGATION      OB History    Gravida Para Term Preterm AB Living   2 2       2    SAB TAB Ectopic Multiple Live Births                   Home Medications    Prior to Admission medications   Medication Sig Start Date End Date Taking? Authorizing Provider  ALPRAZolam Duanne Moron) 1 MG tablet Take 1 tablet (1 mg total) by  mouth 2 (two) times daily as needed for anxiety. 09/02/16  Yes Cloria Spring, MD  ARIPiprazole (ABILIFY) 10 MG tablet Take 10 mg by mouth at bedtime.  11/16/16  Yes [provider]  cycloSPORINE (RESTASIS) 0.05 % ophthalmic emulsion Place 1 drop into both eyes 2 (two) times daily.    Yes [provider]  escitalopram (LEXAPRO) 20 MG tablet Take 1 tablet (20 mg total) by mouth daily. 09/02/16 09/02/17 Yes Cloria Spring, MD  fluticasone [facility]) 50 MCG/ACT nasal spray Place 2 sprays into the nose daily as needed. Allergies   Yes [provider]  linaclotide (LINZESS) 290 MCG CAPS capsule Take 1 capsule (290 mcg total) by mouth daily. Patient taking differently: Take 290 mcg by mouth daily as needed (constipation).  10/08/15  Yes Mahala Menghini, PA-C  lisinopril (PRINIVIL,ZESTRIL) 10 MG tablet Take 10 mg by mouth daily.   Yes [provider]  loratadine (CLARITIN) 10 MG tablet Take 10 mg by mouth daily.   Yes [provider]  meclizine (ANTIVERT) 25 MG tablet Take 25 mg by mouth daily as needed for dizziness.  10/17/14  Yes [provider]  meloxicam (MOBIC) 15 MG tablet Take 1 tablet (15 mg total) by mouth daily. 05/28/16  Yes Sanjuana Kava, MD  metFORMIN (GLUCOPHAGE) 1000 MG tablet Take 1,000 mg by mouth 2 (two) times daily.  09/09/16  Yes [provider]  methocarbamol (ROBAXIN) 500 MG tablet Take 500 mg by mouth 3 (three) times daily as needed for muscle spasms.    Yes [provider]  Multiple Vitamin (MULTIVITAMIN PO) Take 1 tablet by mouth daily.    Yes [provider]  pantoprazole (PROTONIX) 40 MG tablet Take 1 tablet (40 mg total) by mouth 2 (two) times daily. 10/08/15  Yes Mahala Menghini, PA-C  polyethylene glycol (MIRALAX / GLYCOLAX) packet 2 (two) times daily. Take 1 teaspoonful every morning   Yes [provider]  Propylene Glycol-Glycerin (SOOTHE) 0.6-0.6 % SOLN Place 1 drop into both eyes 2 (two) times  daily.   Yes [provider]  Psyllium (METAMUCIL PO) Take 1 tablespoon twice daily   Yes [provider]  rizatriptan (MAXALT-MLT) 10 MG disintegrating tablet Take 10 mg by mouth as needed for migraine. May repeat in 2 hours if needed   Yes [provider]  traMADol (ULTRAM) 50 MG tablet Take 1 tablet (50 mg total) by mouth every 6 (six) hours as needed. 09/29/16  Yes Sanjuana Kava, MD  traZODone (DESYREL) 50 MG tablet Take 1 tablet (50 mg total) by mouth at bedtime. Patient taking differently: Take 50 mg by mouth at bedtime as needed for sleep.  04/27/16  Yes Cloria Spring, MD  HYDROcodone-acetaminophen (NORCO/VICODIN) 5-325 MG tablet Take 1-2 tablets by mouth every 4 (four) hours as needed. 11/21/16  Nat Christen, MD  ondansetron [facility] ODT) 4 MG disintegrating tablet Take 1 tablet (4 mg total) by mouth every 8 (eight) hours as needed for nausea or vomiting. 11/21/16   Nat Christen, MD    Family History Family History  Problem Relation Age of Onset  . Ovarian cancer Mother   . Heart disease Mother   . Anxiety disorder Mother   . Cirrhosis Father        deceased age 44  . Alcohol abuse Father   . Liver cancer Cousin        age 37, deceased  . Drug abuse Cousin   . ADD / ADHD Son   . Anxiety disorder Sister   . Dementia Maternal Grandfather   . Colon cancer Other        aunt, deceased age 57  . Breast cancer Other        aunt, deceased age 26  . Bipolar disorder Neg Hx   . Depression Neg Hx   . OCD Neg Hx   . Paranoid behavior Neg Hx   . Schizophrenia Neg Hx   . Seizures Neg Hx   . Sexual abuse Neg Hx   . Physical abuse Neg Hx     Social History Social History  Substance Use Topics  . Smoking status: Never Smoker  . Smokeless tobacco: Never Used  . Alcohol use 0.0 oz/week     Comment: occasionally wine     Allergies   Bactrim and Neurontin [gabapentin]   Review of Systems Review of Systems  All other systems reviewed and are  negative.    Physical Exam Updated Vital Signs BP 103/68 (BP Location: Left Arm)   Pulse 62   Temp 97.8 F (36.6 C) (Oral)   Resp 16   Ht 5\' 6"  (1.676 m)   Wt 129.3 kg (285 lb)   SpO2 99%   BMI 46.00 kg/m   Physical Exam  Constitutional: She is oriented to person, place, and time.  Obese, NAD  HENT:  Head: Normocephalic and atraumatic.  Eyes: Conjunctivae are normal.  Neck: Neck supple.  Cardiovascular: Normal rate and regular rhythm.   Pulmonary/Chest: Effort normal and breath sounds normal.  Abdominal: Soft. Bowel sounds are normal.  Musculoskeletal: Normal range of motion.  Neurological: She is alert and oriented to person, place, and time.  Skin: Skin is warm and dry.  Psychiatric: She has a normal mood and affect. Her behavior is normal.  Nursing note and vitals reviewed.    ED Treatments / Results  Labs (all labs ordered are listed, but only abnormal results are displayed) Labs Reviewed  CBC WITH DIFFERENTIAL/PLATELET - Abnormal; Notable for the following:       Result Value   WBC 10.6 (*)    RBC 5.20 (*)    Hemoglobin 15.2 (*)    All other components within normal limits  COMPREHENSIVE METABOLIC PANEL - Abnormal; Notable for the following:    Glucose, Bld 144 (*)    All other components within normal limits  TROPONIN I    EKG  EKG Interpretation  Date/Time:  Sunday November 21 2016 09:42:56 EDT Ventricular Rate:  80 PR Interval:    QRS Duration: 103 QT Interval:  376 QTC Calculation: 434 R Axis:   0 Text Interpretation:  Sinus rhythm Low voltage, precordial leads Probable anteroseptal infarct, old Confirmed by Nat Christen [phone]) on 11/21/2016 9:46:42 AM       Radiology Dg Chest 2 View  Result Date: 11/21/2016 CLINICAL  DATA:  Dizziness.  Laparoscopic band tightness 2 weeks ago. EXAM: CHEST  2 VIEW COMPARISON:  01/09/2011. FINDINGS: Borderline enlarged cardiac silhouette. Tortuous and partially calcified thoracic aorta. Clear lungs. Minimal  diffuse peribronchial thickening. Thoracic and upper lumbar spine degenerative changes. IMPRESSION: Borderline cardiomegaly and minimal bronchitic changes. Electronically Signed   By: Claudie Revering M.D.   On: 11/21/2016 10:39    Procedures Procedures (including critical care time)  Medications Ordered in ED Medications  ketorolac (TORADOL) 30 MG/ML injection 30 mg (30 mg Intravenous Given 11/21/16 1022)  metoCLOPramide (REGLAN) injection 10 mg (10 mg Intravenous Given 11/21/16 1021)  diphenhydrAMINE (BENADRYL) injection 25 mg (25 mg Intravenous Given 11/21/16 1020)  sodium chloride 0.9 % bolus 1,000 mL (0 mLs Intravenous Stopped 11/21/16 1121)  sodium chloride 0.9 % bolus 500 mL (0 mLs Intravenous Stopped 11/21/16 1315)     Initial Impression / Assessment and Plan / ED Course  I have reviewed the triage vital signs and the nursing notes.  Pertinent labs & imaging results that were available during my care of the patient were reviewed by me and considered in my medical decision making (see chart for details).     Patient feels much better after hydration and pain management. Screening labs, EKG, troponin, chest x-ray all show no acute pathology. Discharge medications Vicodin and Zofran 4 mg. Patient has primary care follow-up.  Final Clinical Impressions(s) / ED Diagnoses   Final diagnoses:  Chest pain, unspecified type  Acute right-sided low back pain with right-sided sciatica    New Prescriptions Discharge Medication List as of 11/21/2016  2:37 PM    START taking these medications   Details  HYDROcodone-acetaminophen (NORCO/VICODIN) 5-325 MG tablet Take 1-2 tablets by mouth every 4 (four) hours as needed., Starting Sun 11/21/2016, Print    ondansetron (ZOFRAN ODT) 4 MG disintegrating tablet Take 1 tablet (4 mg total) by mouth every 8 (eight) hours as needed for nausea or vomiting., Starting Sun 11/21/2016, Print         Nat Christen, MD 11/23/16 [phone]

## 2016-11-25 ENCOUNTER — Encounter (HOSPITAL_COMMUNITY): Payer: Self-pay | Admitting: Psychiatry

## 2016-11-25 ENCOUNTER — Ambulatory Visit (INDEPENDENT_AMBULATORY_CARE_PROVIDER_SITE_OTHER): Payer: Medicare Other | Admitting: Psychiatry

## 2016-11-25 VITALS — BP 123/80 | HR 84 | Ht 66.0 in | Wt 281.4 lb

## 2016-11-25 DIAGNOSIS — F1010 Alcohol abuse, uncomplicated: Secondary | ICD-10-CM | POA: Diagnosis not present

## 2016-11-25 DIAGNOSIS — F3162 Bipolar disorder, current episode mixed, moderate: Secondary | ICD-10-CM | POA: Diagnosis not present

## 2016-11-25 MED ORDER — ALPRAZOLAM 1 MG PO TABS: 1.0000 mg | ORAL_TABLET | Freq: Two times a day (BID) | ORAL | 2 refills | Status: DC | PRN

## 2016-11-25 MED ORDER — TRAZODONE HCL 50 MG PO TABS: 50.0000 mg | ORAL_TABLET | Freq: Every day | ORAL | 2 refills | Status: DC

## 2016-11-25 MED ORDER — ARIPIPRAZOLE 10 MG PO TABS: 10.0000 mg | ORAL_TABLET | Freq: Every day | ORAL | 2 refills | Status: DC

## 2016-11-25 MED ORDER — ESCITALOPRAM OXALATE 20 MG PO TABS: 20.0000 mg | ORAL_TABLET | Freq: Every day | ORAL | 2 refills | Status: DC
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
## 2016-11-30 ENCOUNTER — Ambulatory Visit: Payer: Self-pay | Admitting: Orthopaedic Surgery

## 2016-12-16 ENCOUNTER — Other Ambulatory Visit (HOSPITAL_COMMUNITY): Payer: Self-pay | Admitting: Family Medicine

## 2016-12-16 DIAGNOSIS — Z1231 Encounter for screening mammogram for malignant neoplasm of breast: Secondary | ICD-10-CM

## 2016-12-22 ENCOUNTER — Encounter: Payer: Self-pay | Admitting: Internal Medicine

## 2016-12-30 NOTE — Addendum Note (Signed)
Addendum  created 12/30/16 1306 by Roberts Gaudy, MD   Sign clinical note

## 2017-01-24 ENCOUNTER — Ambulatory Visit (HOSPITAL_COMMUNITY)
Admission: RE | Admit: 2017-01-24 | Discharge: 2017-01-24 | Disposition: A | Discharge status: Home / Self Care | Payer: Medicare Other | Source: Ambulatory Visit | Attending: Family Medicine | Admitting: Family Medicine

## 2017-01-24 DIAGNOSIS — Z1231 Encounter for screening mammogram for malignant neoplasm of breast: Secondary | ICD-10-CM | POA: Diagnosis not present

## 2017-02-08 NOTE — Patient Instructions (Addendum)
Your procedure is scheduled on: 02/15/2017   Report to [facility] at  800   AM.  Call this number if you have problems the morning of surgery: [phone]   Do not eat food or drink liquids :After Midnight.      Take these medicines the morning of surgery with A SIP OF WATER: xanax, lexapro, lisinopril, claritin, antivert, mobic, ultram, robaxin, zofran, protonix. maxalt.   Do not wear jewelry, make-up or nail polish.  Do not wear lotions, powders, or perfumes. You may wear deodorant.  Do not shave 48 hours prior to surgery.  Do not bring valuables to the hospital.  Contacts, dentures or bridgework may not be worn into surgery.  Leave suitcase in the car. After surgery it may be brought to your room.  For patients admitted to the hospital, checkout time is 11:00 AM the day of discharge.   Patients discharged the day of surgery will not be allowed to drive home.  :     Please read over the following fact sheets that you were given: Coughing and Deep Breathing, Surgical Site Infection Prevention, Anesthesia Post-op Instructions and Care and Recovery After Surgery    Cataract A cataract is a clouding of the lens of the eye. When a lens becomes cloudy, vision is reduced based on the degree and nature of the clouding. Many cataracts reduce vision to some degree. Some cataracts make people more near-sighted as they develop. Other cataracts increase glare. Cataracts that are ignored and become worse can sometimes look white. The white color can be seen through the pupil. CAUSES   Aging. However, cataracts may occur at any age, even in newborns.   Certain drugs.   Trauma to the eye.   Certain diseases such as diabetes.   Specific eye diseases such as chronic inflammation inside the eye or a sudden attack of a rare form of glaucoma.   Inherited or acquired medical problems.  SYMPTOMS   Gradual, progressive drop in vision in the affected eye.   Severe, rapid visual loss. This most often  happens when trauma is the cause.  DIAGNOSIS  To detect a cataract, an eye doctor examines the lens. Cataracts are best diagnosed with an exam of the eyes with the pupils enlarged (dilated) by drops.  TREATMENT  For an early cataract, vision may improve by using different eyeglasses or stronger lighting. If that does not help your vision, surgery is the only effective treatment. A cataract needs to be surgically removed when vision loss interferes with your everyday activities, such as driving, reading, or watching TV. A cataract may also have to be removed if it prevents examination or treatment of another eye problem. Surgery removes the cloudy lens and usually replaces it with a substitute lens (intraocular lens, IOL).  At a time when both you and your doctor agree, the cataract will be surgically removed. If you have cataracts in both eyes, only one is usually removed at a time. This allows the operated eye to heal and be out of danger from any possible problems after surgery (such as infection or poor wound healing). In rare cases, a cataract may be doing damage to your eye. In these cases, your caregiver may advise surgical removal right away. The vast majority of people who have cataract surgery have better vision afterward. HOME CARE INSTRUCTIONS  If you are not planning surgery, you may be asked to do the following:  Use different eyeglasses.   Use stronger or brighter lighting.  Ask your eye doctor about reducing your medicine dose or changing medicines if it is thought that a medicine caused your cataract. Changing medicines does not make the cataract go away on its own.   Become familiar with your surroundings. Poor vision can lead to injury. Avoid bumping into things on the affected side. You are at a higher risk for tripping or falling.   Exercise extreme care when driving or operating machinery.   Wear sunglasses if you are sensitive to bright light or experiencing problems with  glare.  SEEK IMMEDIATE MEDICAL CARE IF:   You have a worsening or sudden vision loss.   You notice redness, swelling, or increasing pain in the eye.   You have a fever.  Document Released: 04/26/2005 Document Revised: 04/15/2011 Document Reviewed: 12/18/2010 [facility] Patient Information 2012 [place].PATIENT INSTRUCTIONS POST-ANESTHESIA  IMMEDIATELY FOLLOWING SURGERY:  Do not drive or operate machinery for the first twenty four hours after surgery.  Do not make any important decisions for twenty four hours after surgery or while taking narcotic pain medications or sedatives.  If you develop intractable nausea and vomiting or a severe headache please notify your doctor immediately.  FOLLOW-UP:  Please make an appointment with your surgeon as instructed. You do not need to follow up with anesthesia unless specifically instructed to do so.  WOUND CARE INSTRUCTIONS (if applicable):  Keep a dry clean dressing on the anesthesia/puncture wound site if there is drainage.  Once the wound has quit draining you may leave it open to air.  Generally you should leave the bandage intact for twenty four hours unless there is drainage.  If the epidural site drains for more than 36-48 hours please call the anesthesia department.  QUESTIONS?:  Please feel free to call your physician or the hospital operator if you have any questions, and they will be happy to assist you.

## 2017-02-09 ENCOUNTER — Encounter (HOSPITAL_COMMUNITY)
Admission: RE | Admit: 2017-02-09 | Discharge: 2017-02-09 | Disposition: A | Discharge status: Home / Self Care | Payer: Medicare Other | Source: Ambulatory Visit | Attending: Ophthalmology | Admitting: Ophthalmology

## 2017-02-09 ENCOUNTER — Encounter (HOSPITAL_COMMUNITY): Payer: Self-pay

## 2017-02-09 DIAGNOSIS — H2511 Age-related nuclear cataract, right eye: Secondary | ICD-10-CM | POA: Insufficient documentation

## 2017-02-09 DIAGNOSIS — Z01812 Encounter for preprocedural laboratory examination: Secondary | ICD-10-CM | POA: Insufficient documentation

## 2017-02-09 DIAGNOSIS — H25011 Cortical age-related cataract, right eye: Secondary | ICD-10-CM | POA: Insufficient documentation

## 2017-02-09 LAB — CBC WITH DIFFERENTIAL/PLATELET
BASOS ABS: 0 10*3/uL (ref 0.0–0.1)
Basophils Relative: 1 %
EOS ABS: 0.4 10*3/uL (ref 0.0–0.7)
EOS PCT: 6 %
HCT: 41.7 % (ref 36.0–46.0)
Hemoglobin: 13.8 g/dL (ref 12.0–15.0)
Lymphocytes Relative: 32 %
Lymphs Abs: 2 10*3/uL (ref 0.7–4.0)
MCH: 29.3 pg (ref 26.0–34.0)
MCHC: 33.1 g/dL (ref 30.0–36.0)
MCV: 88.5 fL (ref 78.0–100.0)
MONO ABS: 0.3 10*3/uL (ref 0.1–1.0)
Monocytes Relative: 5 %
Neutro Abs: 3.5 10*3/uL (ref 1.7–7.7)
Neutrophils Relative %: 56 %
Platelets: 188 10*3/uL (ref 150–400)
RBC: 4.71 MIL/uL (ref 3.87–5.11)
RDW: 13.3 % (ref 11.5–15.5)
WBC: 6.3 10*3/uL (ref 4.0–10.5)

## 2017-02-09 LAB — BASIC METABOLIC PANEL
Anion gap: 7 (ref 5–15)
BUN: 22 mg/dL — AB (ref 6–20)
CALCIUM: 8.8 mg/dL — AB (ref 8.9–10.3)
CO2: 27 mmol/L (ref 22–32)
Chloride: 102 mmol/L (ref 101–111)
Creatinine, Ser: 0.71 mg/dL (ref 0.44–1.00)
GFR calc Af Amer: >60 mL/min (ref >60)
GLUCOSE: 195 mg/dL — AB (ref 65–99)
Potassium: 4.1 mmol/L (ref 3.5–5.1)
SODIUM: 136 mmol/L (ref 135–145)

## 2017-02-15 ENCOUNTER — Ambulatory Visit (HOSPITAL_COMMUNITY): Payer: Medicare Other | Admitting: Anesthesiology

## 2017-02-15 ENCOUNTER — Ambulatory Visit (HOSPITAL_COMMUNITY)
Admission: RE | Admit: 2017-02-15 | Discharge: 2017-02-15 | Disposition: A | Discharge status: Home / Self Care | Payer: Medicare Other | Source: Ambulatory Visit | Attending: Ophthalmology | Admitting: Ophthalmology

## 2017-02-15 ENCOUNTER — Encounter (HOSPITAL_COMMUNITY)
Admission: RE | Disposition: A | Discharge status: Home / Self Care | Payer: Self-pay | Source: Ambulatory Visit | Attending: Ophthalmology

## 2017-02-15 ENCOUNTER — Encounter (HOSPITAL_COMMUNITY): Payer: Self-pay | Admitting: *Deleted

## 2017-02-15 DIAGNOSIS — F419 Anxiety disorder, unspecified: Secondary | ICD-10-CM | POA: Insufficient documentation

## 2017-02-15 DIAGNOSIS — G4730 Sleep apnea, unspecified: Secondary | ICD-10-CM | POA: Insufficient documentation

## 2017-02-15 DIAGNOSIS — Z7984 Long term (current) use of oral hypoglycemic drugs: Secondary | ICD-10-CM | POA: Insufficient documentation

## 2017-02-15 DIAGNOSIS — E1136 Type 2 diabetes mellitus with diabetic cataract: Secondary | ICD-10-CM | POA: Diagnosis present

## 2017-02-15 DIAGNOSIS — I10 Essential (primary) hypertension: Secondary | ICD-10-CM | POA: Diagnosis not present

## 2017-02-15 DIAGNOSIS — K219 Gastro-esophageal reflux disease without esophagitis: Secondary | ICD-10-CM | POA: Insufficient documentation

## 2017-02-15 DIAGNOSIS — F319 Bipolar disorder, unspecified: Secondary | ICD-10-CM | POA: Diagnosis not present

## 2017-02-15 DIAGNOSIS — R51 Headache: Secondary | ICD-10-CM | POA: Insufficient documentation

## 2017-02-15 HISTORY — PX: CATARACT EXTRACTION W/PHACO: SHX586

## 2017-02-15 LAB — GLUCOSE, CAPILLARY: GLUCOSE-CAPILLARY: 128 mg/dL — AB (ref 65–99)

## 2017-02-15 SURGERY — PHACOEMULSIFICATION, CATARACT, WITH IOL INSERTION
Anesthesia: Monitor Anesthesia Care | Site: Eye | Laterality: Right

## 2017-02-15 MED ORDER — CYCLOPENTOLATE-PHENYLEPHRINE 0.2-1 % OP SOLN
1.0000 [drp] | OPHTHALMIC | Status: AC
  Administered 2017-02-15 (×3): 1 [drp] via OPHTHALMIC

## 2017-02-15 MED ORDER — MIDAZOLAM HCL 2 MG/2ML IJ SOLN
1.0000 mg | INTRAMUSCULAR | Status: AC
  Administered 2017-02-15: 2 mg via INTRAVENOUS

## 2017-02-15 MED ORDER — PHENYLEPHRINE HCL 2.5 % OP SOLN
1.0000 [drp] | OPHTHALMIC | Status: AC
  Administered 2017-02-15 (×3): 1 [drp] via OPHTHALMIC

## 2017-02-15 MED ORDER — MIDAZOLAM HCL 2 MG/2ML IJ SOLN
INTRAMUSCULAR | Status: AC
  Filled 2017-02-15: qty 2

## 2017-02-15 MED ORDER — FENTANYL CITRATE (PF) 100 MCG/2ML IJ SOLN
INTRAMUSCULAR | Status: AC
  Filled 2017-02-15: qty 2

## 2017-02-15 MED ORDER — PROVISC 10 MG/ML IO SOLN
INTRAOCULAR | Status: DC | PRN
  Administered 2017-02-15: 0.85 mL via INTRAOCULAR

## 2017-02-15 MED ORDER — BSS IO SOLN
INTRAOCULAR | Status: DC | PRN
  Administered 2017-02-15: 15 mL

## 2017-02-15 MED ORDER — KETOROLAC TROMETHAMINE 0.5 % OP SOLN
1.0000 [drp] | OPHTHALMIC | Status: AC
  Administered 2017-02-15 (×3): 1 [drp] via OPHTHALMIC

## 2017-02-15 MED ORDER — TETRACAINE 0.5 % OP SOLN OPTIME - NO CHARGE
OPHTHALMIC | Status: DC | PRN
  Administered 2017-02-15: 2 [drp] via OPHTHALMIC

## 2017-02-15 MED ORDER — TETRACAINE HCL 0.5 % OP SOLN
1.0000 [drp] | OPHTHALMIC | Status: AC
  Administered 2017-02-15 (×3): 1 [drp] via OPHTHALMIC

## 2017-02-15 MED ORDER — BSS IO SOLN
INTRAOCULAR | Status: DC | PRN
  Administered 2017-02-15: 500 mL

## 2017-02-15 MED ORDER — LACTATED RINGERS IV SOLN
INTRAVENOUS | Status: DC
  Administered 2017-02-15: 09:00:00 via INTRAVENOUS

## 2017-02-15 MED ORDER — FENTANYL CITRATE (PF) 100 MCG/2ML IJ SOLN
25.0000 ug | Freq: Once | INTRAMUSCULAR | Status: AC
  Administered 2017-02-15: 25 ug via INTRAVENOUS

## 2017-02-15 SURGICAL SUPPLY — 9 items
CLOTH BEACON ORANGE TIMEOUT ST (SAFETY) ×3 IMPLANT
EYE SHIELD UNIVERSAL CLEAR (GAUZE/BANDAGES/DRESSINGS) ×3 IMPLANT
GLOVE BIOGEL PI IND STRL 6.5 (GLOVE) ×2 IMPLANT
GLOVE BIOGEL PI INDICATOR 6.5 (GLOVE) ×4
LENS ALC ACRYL/TECN (Ophthalmic Related) ×3 IMPLANT
PAD ARMBOARD 7.5X6 YLW CONV (MISCELLANEOUS) ×3 IMPLANT
TAPE SURG TRANSPORE 1 IN (GAUZE/BANDAGES/DRESSINGS) ×1 IMPLANT
TAPE SURGICAL TRANSPORE 1 IN (GAUZE/BANDAGES/DRESSINGS) ×2
WATER STERILE IRR 250ML POUR (IV SOLUTION) ×3 IMPLANT

## 2017-02-15 NOTE — H&P (Signed)
The patient was re examined and there is no change in the patients condition since the original H and P. 

## 2017-02-15 NOTE — Anesthesia Postprocedure Evaluation (Signed)
Anesthesia Post Note  Patient: [name]  Procedure(s) Performed: CATARACT EXTRACTION PHACO AND INTRAOCULAR LENS PLACEMENT (IOC) (Right Eye)  Patient location during evaluation: Short Stay Anesthesia Type: MAC Level of consciousness: awake and alert and oriented Pain management: pain level controlled Vital Signs Assessment: post-procedure vital signs reviewed and stable Respiratory status: spontaneous breathing Cardiovascular status: stable Postop Assessment: no apparent nausea or vomiting Anesthetic complications: no     Last Vitals:  Vitals:   02/15/17 0925 02/15/17 0930  BP: 138/78 121/75  Resp: (!) 22 (!) 23  Temp:    SpO2: 99% 99%    Last Pain:  Vitals:   02/15/17 0850  TempSrc: Oral  PainSc: 6                  Jaziya Obarr

## 2017-02-15 NOTE — Op Note (Signed)
Patient brought to the operating room and prepped and draped in the usual manner.  Lid speculum inserted in right eye.  Stab incision made at the twelve o'clock position.  Provisc instilled in the anterior chamber.   A 2.4 mm. Stab incision was made temporally.  An anterior capsulotomy was done with a bent 25 gauge needle.  The nucleus was hydrodissected.  The Phaco tip was inserted in the anterior chamber and the nucleus was emulsified.  CDE was 4.18.  The cortical material was then removed with the I and A tip.  Posterior capsule was the polished.  The anterior chamber was deepened with Provisc.  A 20.5 Diopter Alcon [id number] IOL was then inserted in the capsular bag.  Provisc was then removed with the I and A tip.  The wound was then hydrated.  Patient sent to the Recovery Room in good condition with follow up in my office.  Preoperative Diagnosis: Cortical and Nuclear Cataract OD Postoperative Diagnosis:  Same Procedure name: Kelman Phacoemulsification OD with IOL

## 2017-02-15 NOTE — Transfer of Care (Signed)
Immediate Anesthesia Transfer of Care Note  Patient: [name]  Procedure(s) Performed: CATARACT EXTRACTION PHACO AND INTRAOCULAR LENS PLACEMENT (IOC) (Right Eye)  Patient Location: Short Stay  Anesthesia Type:MAC  Level of Consciousness: awake  Airway & Oxygen Therapy: Patient Spontanous Breathing  Post-op Assessment: Report given to RN  Post vital signs: Reviewed  Last Vitals:  Vitals:   02/15/17 0925 02/15/17 0930  BP: 138/78 121/75  Resp: (!) 22 (!) 23  Temp:    SpO2: 99% 99%    Last Pain:  Vitals:   02/15/17 0850  TempSrc: Oral  PainSc: 6       Patients Stated Pain Goal: 7 (49/20/10 0712)  Complications: No apparent anesthesia complications

## 2017-02-15 NOTE — Discharge Instructions (Signed)
°  °          [facility] Instructions °[address]- [place] [address]-[place] °    ° °1. Avoid closing eyes tightly. One often closes the eye tightly when laughing, talking, sneezing, coughing or if they feel irritated. At these times, you should be careful not to close your eyes tightly. ° °2. Instill eye drops as instructed. To instill drops in your eye, open it, look up and have someone gently pull the lower lid down and instill a couple of drops inside the lower lid. ° °3. Do not touch upper lid. ° °4. Take Advil or Tylenol for pain. ° °5. You may use either eye for near work, such as reading or sewing and you may watch television. ° °6. You may have your hair done at the beauty parlor at any time. ° °7. Wear dark glasses with or without your own glasses if you are in bright light. ° °8. Call our office at [phone] or [phone] if you have sharp pain in your eye or unusual symptoms. ° °9.  FOLLOW UP WITH DR. SHAPIRO TODAY IN HIS [place] OFFICE AT 2:45pm. ° °  °I have received a copy of the above instructions and will follow them.  ° ° ° °IF YOU ARE IN IMMEDIATE DANGER CALL 911! ° °It is important for you to keep your follow-up appointment with your physician after discharge, OR, for you /your caregiver to make a follow-up appointment with your physician / medical provider after discharge. ° °Show these instructions to the next healthcare provider you see. ° °

## 2017-02-15 NOTE — Anesthesia Preprocedure Evaluation (Signed)
Anesthesia Evaluation  Patient identified by MRN, date of birth, ID band Patient awake    Reviewed: Allergy & Precautions, NPO status , Patient's Chart, lab work & pertinent test results  Airway Mallampati: II  TM Distance: >3 FB     Dental  (+) Teeth Intact   Pulmonary sleep apnea ,    breath sounds clear to auscultation       Cardiovascular hypertension, negative cardio ROS   Rhythm:Regular Rate:Normal     Neuro/Psych  Headaches, PSYCHIATRIC DISORDERS Anxiety Depression Bipolar Disorder    GI/Hepatic GERD  ,  Endo/Other  diabetes, Type 2, Oral Hypoglycemic Agents  Renal/GU      Musculoskeletal   Abdominal   Peds  Hematology   Anesthesia Other Findings   Reproductive/Obstetrics                             Anesthesia Physical Anesthesia Plan  ASA: III  Anesthesia Plan: MAC   Post-op Pain Management:    Induction: Intravenous  PONV Risk Score and Plan:   Airway Management Planned: Nasal Cannula  Additional Equipment:   Intra-op Plan:   Post-operative Plan:   Informed Consent: I have reviewed the patients History and Physical, chart, labs and discussed the procedure including the risks, benefits and alternatives for the proposed anesthesia with the patient or authorized representative who has indicated his/her understanding and acceptance.     Plan Discussed with:   Anesthesia Plan Comments:         Anesthesia Quick Evaluation

## 2017-02-16 ENCOUNTER — Encounter (HOSPITAL_COMMUNITY): Payer: Self-pay | Admitting: Ophthalmology

## 2017-02-21 ENCOUNTER — Encounter (HOSPITAL_COMMUNITY): Payer: Self-pay | Admitting: Psychiatry

## 2017-02-21 ENCOUNTER — Ambulatory Visit (INDEPENDENT_AMBULATORY_CARE_PROVIDER_SITE_OTHER): Payer: Medicare Other | Admitting: Psychiatry

## 2017-02-21 VITALS — BP 115/78 | HR 76 | Ht 66.0 in | Wt 273.0 lb

## 2017-02-21 DIAGNOSIS — Z630 Problems in relationship with spouse or partner: Secondary | ICD-10-CM | POA: Diagnosis not present

## 2017-02-21 DIAGNOSIS — Z813 Family history of other psychoactive substance abuse and dependence: Secondary | ICD-10-CM | POA: Diagnosis not present

## 2017-02-21 DIAGNOSIS — F1021 Alcohol dependence, in remission: Secondary | ICD-10-CM | POA: Diagnosis not present

## 2017-02-21 DIAGNOSIS — Z811 Family history of alcohol abuse and dependence: Secondary | ICD-10-CM

## 2017-02-21 DIAGNOSIS — Z818 Family history of other mental and behavioral disorders: Secondary | ICD-10-CM | POA: Diagnosis not present

## 2017-02-21 DIAGNOSIS — F3162 Bipolar disorder, current episode mixed, moderate: Secondary | ICD-10-CM | POA: Diagnosis not present

## 2017-02-21 DIAGNOSIS — Z736 Limitation of activities due to disability: Secondary | ICD-10-CM

## 2017-02-21 MED ORDER — TRAZODONE HCL 50 MG PO TABS: 50.0000 mg | ORAL_TABLET | Freq: Every day | ORAL | 2 refills | Status: DC

## 2017-02-21 MED ORDER — ESCITALOPRAM OXALATE 20 MG PO TABS: 20.0000 mg | ORAL_TABLET | Freq: Every day | ORAL | 2 refills | Status: DC

## 2017-02-21 MED ORDER — ALPRAZOLAM 1 MG PO TABS: 1.0000 mg | ORAL_TABLET | Freq: Three times a day (TID) | ORAL | 1 refills | Status: DC | PRN

## 2017-02-21 MED ORDER — ARIPIPRAZOLE 10 MG PO TABS: 10.0000 mg | ORAL_TABLET | Freq: Every day | ORAL | 2 refills | Status: DC

## 2017-02-21 NOTE — Telephone Encounter (Signed)
Close Encounter 
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
## 2017-02-22 ENCOUNTER — Ambulatory Visit (INDEPENDENT_AMBULATORY_CARE_PROVIDER_SITE_OTHER): Payer: Medicare Other | Admitting: Orthopaedic Surgery

## 2017-02-22 ENCOUNTER — Encounter: Payer: Self-pay | Admitting: Orthopaedic Surgery

## 2017-02-22 VITALS — BP 137/84 | HR 63 | Ht 63.0 in | Wt 273.0 lb

## 2017-02-22 DIAGNOSIS — M25561 Pain in right knee: Secondary | ICD-10-CM | POA: Diagnosis not present

## 2017-02-22 DIAGNOSIS — M25562 Pain in left knee: Secondary | ICD-10-CM

## 2017-02-22 DIAGNOSIS — G8929 Other chronic pain: Secondary | ICD-10-CM | POA: Diagnosis not present

## 2017-02-22 MED ORDER — MELOXICAM 15 MG PO TABS
15.0000 mg | ORAL_TABLET | Freq: Every day | ORAL | 5 refills | Status: DC
Start: 2017-02-22 — End: 2019-12-21

## 2017-02-22 MED ORDER — TRAMADOL HCL 50 MG PO TABS: 50.0000 mg | ORAL_TABLET | Freq: Four times a day (QID) | ORAL | 3 refills | Status: DC | PRN

## 2017-02-22 MED ORDER — MELOXICAM 15 MG PO TABS: 15.0000 mg | ORAL_TABLET | Freq: Every day | ORAL | 5 refills | Status: DC

## 2017-02-22 NOTE — Progress Notes (Signed)
CC: Both of my knees are hurting. I would like an injection in both knees.  The patient has had chronic pain and tenderness of both knees for some time.  Injections help.  There is no locking or giving way of the knee.  There is no new trauma. There is no redness or signs of infections.  The knees have a mild effusion and some crepitus.  There is no redness or signs of recent trauma.  Right knee ROM is 0-105 and left knee ROM is 0-100.  Impression:  Chronic pain of the both knees  Return:  3 months  PROCEDURE NOTE:  The patient requests injections of both knees, verbal consent was obtained.  The left and right knee were individually prepped appropriately after time out was performed.   Sterile technique was observed and injection of 1 cc of Depo-Medrol 40 mg with several cc's of plain xylocaine. Anesthesia was provided by ethyl chloride and a 20-gauge needle was used to inject each knee area. The injections were tolerated well.  A band aid dressing was applied.  The patient was advised to apply ice later today and tomorrow to the injection sight as needed.   I renewed her Tramadol and Mobic.  Electronically Signed Sanjuana Kava, MD 10/16/201810:53 AM

## 2017-02-23 ENCOUNTER — Encounter (HOSPITAL_COMMUNITY): Payer: Self-pay

## 2017-02-23 ENCOUNTER — Encounter (HOSPITAL_COMMUNITY)
Admission: RE | Admit: 2017-02-23 | Discharge: 2017-02-23 | Disposition: A | Discharge status: Home / Self Care | Payer: Medicare Other | Source: Ambulatory Visit | Attending: Ophthalmology | Admitting: Ophthalmology

## 2017-03-01 ENCOUNTER — Encounter (HOSPITAL_COMMUNITY): Payer: Self-pay | Admitting: *Deleted

## 2017-03-01 ENCOUNTER — Encounter (HOSPITAL_COMMUNITY)
Admission: RE | Disposition: A | Discharge status: Home / Self Care | Payer: Self-pay | Source: Ambulatory Visit | Attending: Ophthalmology

## 2017-03-01 ENCOUNTER — Ambulatory Visit (HOSPITAL_COMMUNITY)
Admission: RE | Admit: 2017-03-01 | Discharge: 2017-03-01 | Disposition: A | Discharge status: Home / Self Care | Payer: Medicare Other | Source: Ambulatory Visit | Attending: Ophthalmology | Admitting: Ophthalmology

## 2017-03-01 ENCOUNTER — Ambulatory Visit (HOSPITAL_COMMUNITY): Payer: Medicare Other | Admitting: Anesthesiology

## 2017-03-01 DIAGNOSIS — K449 Diaphragmatic hernia without obstruction or gangrene: Secondary | ICD-10-CM | POA: Insufficient documentation

## 2017-03-01 DIAGNOSIS — Z79899 Other long term (current) drug therapy: Secondary | ICD-10-CM | POA: Diagnosis not present

## 2017-03-01 DIAGNOSIS — F319 Bipolar disorder, unspecified: Secondary | ICD-10-CM | POA: Insufficient documentation

## 2017-03-01 DIAGNOSIS — K219 Gastro-esophageal reflux disease without esophagitis: Secondary | ICD-10-CM | POA: Insufficient documentation

## 2017-03-01 DIAGNOSIS — M1990 Unspecified osteoarthritis, unspecified site: Secondary | ICD-10-CM | POA: Insufficient documentation

## 2017-03-01 DIAGNOSIS — I10 Essential (primary) hypertension: Secondary | ICD-10-CM | POA: Insufficient documentation

## 2017-03-01 DIAGNOSIS — G4730 Sleep apnea, unspecified: Secondary | ICD-10-CM | POA: Diagnosis not present

## 2017-03-01 DIAGNOSIS — D649 Anemia, unspecified: Secondary | ICD-10-CM | POA: Insufficient documentation

## 2017-03-01 DIAGNOSIS — E1036 Type 1 diabetes mellitus with diabetic cataract: Secondary | ICD-10-CM | POA: Insufficient documentation

## 2017-03-01 DIAGNOSIS — R51 Headache: Secondary | ICD-10-CM | POA: Insufficient documentation

## 2017-03-01 DIAGNOSIS — F419 Anxiety disorder, unspecified: Secondary | ICD-10-CM | POA: Insufficient documentation

## 2017-03-01 HISTORY — PX: CATARACT EXTRACTION W/PHACO: SHX586

## 2017-03-01 LAB — GLUCOSE, CAPILLARY: GLUCOSE-CAPILLARY: 129 mg/dL — AB (ref 65–99)

## 2017-03-01 SURGERY — PHACOEMULSIFICATION, CATARACT, WITH IOL INSERTION
Anesthesia: Monitor Anesthesia Care | Site: Eye | Laterality: Left

## 2017-03-01 MED ORDER — MIDAZOLAM HCL 2 MG/2ML IJ SOLN
1.0000 mg | Freq: Once | INTRAMUSCULAR | Status: AC | PRN
  Administered 2017-03-01: 2 mg via INTRAVENOUS

## 2017-03-01 MED ORDER — CYCLOPENTOLATE-PHENYLEPHRINE 0.2-1 % OP SOLN
1.0000 [drp] | OPHTHALMIC | Status: AC
  Administered 2017-03-01 (×3): 1 [drp] via OPHTHALMIC

## 2017-03-01 MED ORDER — PHENYLEPHRINE HCL 2.5 % OP SOLN
1.0000 [drp] | OPHTHALMIC | Status: AC
  Administered 2017-03-01 (×3): 1 [drp] via OPHTHALMIC

## 2017-03-01 MED ORDER — TETRACAINE 0.5 % OP SOLN OPTIME - NO CHARGE
OPHTHALMIC | Status: DC | PRN
  Administered 2017-03-01: 2 [drp] via OPHTHALMIC

## 2017-03-01 MED ORDER — BSS IO SOLN
INTRAOCULAR | Status: DC | PRN
  Administered 2017-03-01: 15 mL

## 2017-03-01 MED ORDER — KETOROLAC TROMETHAMINE 0.5 % OP SOLN
1.0000 [drp] | OPHTHALMIC | Status: AC
  Administered 2017-03-01 (×3): 1 [drp] via OPHTHALMIC

## 2017-03-01 MED ORDER — PROVISC 10 MG/ML IO SOLN
INTRAOCULAR | Status: DC | PRN
  Administered 2017-03-01: 0.85 mL via INTRAOCULAR

## 2017-03-01 MED ORDER — TETRACAINE HCL 0.5 % OP SOLN
1.0000 [drp] | OPHTHALMIC | Status: AC
  Administered 2017-03-01 (×3): 1 [drp] via OPHTHALMIC

## 2017-03-01 MED ORDER — ONDANSETRON HCL 4 MG/2ML IJ SOLN
INTRAMUSCULAR | Status: AC
  Filled 2017-03-01: qty 2

## 2017-03-01 MED ORDER — ONDANSETRON 4 MG PO TBDP
ORAL_TABLET | ORAL | Status: AC
  Filled 2017-03-01: qty 1

## 2017-03-01 MED ORDER — MIDAZOLAM HCL 2 MG/2ML IJ SOLN
INTRAMUSCULAR | Status: AC
  Filled 2017-03-01: qty 2

## 2017-03-01 MED ORDER — LACTATED RINGERS IV SOLN
INTRAVENOUS | Status: DC
  Administered 2017-03-01: 08:00:00 via INTRAVENOUS

## 2017-03-01 MED ORDER — EPINEPHRINE PF 1 MG/ML IJ SOLN
INTRAOCULAR | Status: DC | PRN
  Administered 2017-03-01: 500 mL

## 2017-03-01 MED ORDER — ONDANSETRON 4 MG PO TBDP
4.0000 mg | ORAL_TABLET | Freq: Once | ORAL | Status: AC
  Administered 2017-03-01: 4 mg via ORAL

## 2017-03-01 SURGICAL SUPPLY — 11 items
CLOTH BEACON ORANGE TIMEOUT ST (SAFETY) ×3 IMPLANT
EYE SHIELD UNIVERSAL CLEAR (GAUZE/BANDAGES/DRESSINGS) ×3 IMPLANT
GLOVE BIOGEL PI IND STRL 6.5 (GLOVE) ×1 IMPLANT
GLOVE BIOGEL PI IND STRL 7.0 (GLOVE) ×1 IMPLANT
GLOVE BIOGEL PI INDICATOR 6.5 (GLOVE) ×2
GLOVE BIOGEL PI INDICATOR 7.0 (GLOVE) ×2
LENS ALC ACRYL/TECN (Ophthalmic Related) ×3 IMPLANT
PAD ARMBOARD 7.5X6 YLW CONV (MISCELLANEOUS) ×3 IMPLANT
TAPE SURG TRANSPORE 1 IN (GAUZE/BANDAGES/DRESSINGS) ×1 IMPLANT
TAPE SURGICAL TRANSPORE 1 IN (GAUZE/BANDAGES/DRESSINGS) ×2
WATER STERILE IRR 250ML POUR (IV SOLUTION) ×3 IMPLANT

## 2017-03-01 NOTE — H&P (Signed)
The patient was re examined and there is no change in the patients condition since the original H and P. 

## 2017-03-01 NOTE — Discharge Instructions (Signed)
°  °          [facility] Instructions °[address]- [place] [address] °    ° °1. Avoid closing eyes tightly. One often closes the eye tightly when laughing, talking, sneezing, coughing or if they feel irritated. At these times, you should be careful not to close your eyes tightly. ° °2. Instill eye drops as instructed. To instill drops in your eye, open it, look up and have someone gently pull the lower lid down and instill a couple of drops inside the lower lid. ° °3. Do not touch upper lid. ° °4. Take Advil or Tylenol for pain. ° °5. You may use either eye for near work, such as reading or sewing and you may watch television. ° °6. You may have your hair done at the beauty parlor at any time. ° °7. Wear dark glasses with or without your own glasses if you are in bright light. ° °8. Call our office at [phone] or [phone] if you have sharp pain in your eye or unusual symptoms. ° °9.  FOLLOW UP WITH DR. SHAPIRO TODAY IN HIS [place] OFFICE AT 2:45pm. ° °  °I have received a copy of the above instructions and will follow them.  ° ° ° °IF YOU ARE IN IMMEDIATE DANGER CALL 911! ° °It is important for you to keep your follow-up appointment with your physician after discharge, OR, for you /your caregiver to make a follow-up appointment with your physician / medical provider after discharge. ° °Show these instructions to the next healthcare provider you see. ° °

## 2017-03-01 NOTE — Anesthesia Preprocedure Evaluation (Signed)
Anesthesia Evaluation  Patient identified by MRN, date of birth, ID band Patient awake    Reviewed: Unable to perform ROS - Chart review only  Airway Mallampati: II  TM Distance: >3 FB Neck ROM: Full    Dental  (+) Teeth Intact   Pulmonary sleep apnea (level 7) and Continuous Positive Airway Pressure Ventilation ,    Pulmonary exam normal        Cardiovascular hypertension, Pt. on medications  Rhythm:Regular Rate:Normal     Neuro/Psych  Headaches, PSYCHIATRIC DISORDERS Anxiety Depression Bipolar Disorder    GI/Hepatic hiatal hernia, GERD  ,  Endo/Other  diabetes, Well Controlled, Type 1  Renal/GU      Musculoskeletal  (+) Arthritis ,   Abdominal Normal abdominal exam  (+)   Peds  Hematology  (+) anemia ,   Anesthesia Other Findings   Reproductive/Obstetrics                             Anesthesia Physical Anesthesia Plan  ASA: IV  Anesthesia Plan: MAC   Post-op Pain Management:    Induction:   PONV Risk Score and Plan:   Airway Management Planned: Nasal Cannula  Additional Equipment:   Intra-op Plan:   Post-operative Plan:   Informed Consent: I have reviewed the patients History and Physical, chart, labs and discussed the procedure including the risks, benefits and alternatives for the proposed anesthesia with the patient or authorized representative who has indicated his/her understanding and acceptance.   Dental advisory given  Plan Discussed with: CRNA and Surgeon  Anesthesia Plan Comments:         Anesthesia Quick Evaluation

## 2017-03-01 NOTE — Op Note (Signed)
Patient brought to the operating room and prepped and draped in the usual manner.  Lid speculum inserted in left eye.  Stab incision made at the twelve o'clock position.  Provisc instilled in the anterior chamber.   A 2.4 mm. Stab incision was made temporally.  An anterior capsulotomy was done with a bent 25 gauge needle.  The nucleus was hydrodissected.  The Phaco tip was inserted in the anterior chamber and the nucleus was emulsified.  CDE was 2.56.  The cortical material was then removed with the I and A tip.  Posterior capsule was the polished.  The anterior chamber was deepened with Provisc.  A 19.5 Diopter Alcon [id number] IOL was then inserted in the capsular bag.  Provisc was then removed with the I and A tip.  The wound was then hydrated.  Patient sent to the Recovery Room in good condition with follow up in my office.  Preoperative Diagnosis: Cortical and Nuclear Cataract OS Postoperative Diagnosis:  Same Procedure name: Kelman Phacoemulsification OS with IOL

## 2017-03-01 NOTE — Transfer of Care (Signed)
Immediate Anesthesia Transfer of Care Note  Patient: [name]  Procedure(s) Performed: CATARACT EXTRACTION PHACO AND INTRAOCULAR LENS PLACEMENT ([facility]) (Left Eye)  Patient Location: Short Stay  Anesthesia Type:MAC  Level of Consciousness: awake  Airway & Oxygen Therapy: Patient Spontanous Breathing  Post-op Assessment: Report given to RN  Post vital signs: Reviewed  Last Vitals:  Vitals:   03/01/17 0800 03/01/17 0805  BP: 126/76 122/80  Resp: 14 (!) 28  Temp:    SpO2: 97% 98%    Last Pain:  Vitals:   03/01/17 0709  TempSrc: Oral         Complications: No apparent anesthesia complications

## 2017-03-01 NOTE — Anesthesia Postprocedure Evaluation (Signed)
Anesthesia Post Note  Patient: VEANNA DOWER  Procedure(s) Performed: CATARACT EXTRACTION PHACO AND INTRAOCULAR LENS PLACEMENT ([place]) (Left Eye)  Patient location during evaluation: Short Stay Anesthesia Type: MAC Level of consciousness: awake and alert and oriented Pain management: pain level controlled Vital Signs Assessment: post-procedure vital signs reviewed and stable Respiratory status: spontaneous breathing Cardiovascular status: blood pressure returned to baseline and stable Postop Assessment: no apparent nausea or vomiting Anesthetic complications: no     Last Vitals:  Vitals:   03/01/17 0800 03/01/17 0805  BP: 126/76 122/80  Resp: 14 (!) 28  Temp:    SpO2: 97% 98%    Last Pain:  Vitals:   03/01/17 0709  TempSrc: Oral                 Roneisha Stern

## 2017-03-02 ENCOUNTER — Encounter (HOSPITAL_COMMUNITY): Payer: Self-pay | Admitting: Ophthalmology

## 2017-03-22 ENCOUNTER — Ambulatory Visit: Payer: Medicare Other | Admitting: Orthopaedic Surgery

## 2017-03-24 ENCOUNTER — Encounter: Payer: Self-pay | Admitting: Internal Medicine

## 2017-04-06 ENCOUNTER — Telehealth: Payer: Self-pay

## 2017-04-06 NOTE — Telephone Encounter (Signed)
Pt called- she had received a letter in the mail stating it was time for her to schedule a tcs. Pt didn't think she was due for one yet. Pt just had a tcs in 05/2016. Per RMR recommendations from the procedure note, she is not due for her next tcs for 5 years. I informed the pt of this and she said she was not having any problems right now and she didn't think she was due at this time. Pt is on the recall for 05/2021.   Routing to RMR for FYI.

## 2017-04-07 NOTE — Telephone Encounter (Signed)
noted 

## 2017-04-07 NOTE — Telephone Encounter (Signed)
Patient is correct. Barring any new symptoms in the interim, she should return in January 2023 for her next colonoscopy

## 2017-04-27 ENCOUNTER — Ambulatory Visit (INDEPENDENT_AMBULATORY_CARE_PROVIDER_SITE_OTHER): Payer: Medicare Other | Admitting: Orthopaedic Surgery

## 2017-04-27 ENCOUNTER — Encounter: Payer: Self-pay | Admitting: Orthopaedic Surgery

## 2017-04-27 VITALS — Ht 63.0 in | Wt 269.0 lb

## 2017-04-27 DIAGNOSIS — M25561 Pain in right knee: Secondary | ICD-10-CM | POA: Diagnosis not present

## 2017-04-27 DIAGNOSIS — G8929 Other chronic pain: Secondary | ICD-10-CM

## 2017-04-27 DIAGNOSIS — M25562 Pain in left knee: Secondary | ICD-10-CM

## 2017-04-27 NOTE — Progress Notes (Signed)
CC: Both of my knees are hurting. I would like an injection in both knees.  The patient has had chronic pain and tenderness of both knees for some time.  Injections help.  There is no locking or giving way of the knee.  There is no new trauma. There is no redness or signs of infections.  The knees have a mild effusion and some crepitus.  There is no redness or signs of recent trauma.  Right knee ROM is 0-100 and left knee ROM is 0-110.  Impression:  Chronic pain of the both knees  Return:  2 months  PROCEDURE NOTE:  The patient requests injections of both knees, verbal consent was obtained.  The left and right knee were individually prepped appropriately after time out was performed.   Sterile technique was observed and injection of 1 cc of Depo-Medrol 40 mg with several cc's of plain xylocaine. Anesthesia was provided by ethyl chloride and a 20-gauge needle was used to inject each knee area. The injections were tolerated well.  A band aid dressing was applied.  The patient was advised to apply ice later today and tomorrow to the injection sight as needed.   Electronically Signed Sanjuana Kava, MD 12/19/20181:48 PM

## 2017-06-03 ENCOUNTER — Encounter (HOSPITAL_COMMUNITY): Payer: Self-pay | Admitting: Psychiatry

## 2017-06-03 ENCOUNTER — Ambulatory Visit (INDEPENDENT_AMBULATORY_CARE_PROVIDER_SITE_OTHER): Payer: Medicare Other | Admitting: Psychiatry

## 2017-06-03 VITALS — BP 126/85 | HR 66 | Ht 63.0 in | Wt 269.0 lb

## 2017-06-03 DIAGNOSIS — M2550 Pain in unspecified joint: Secondary | ICD-10-CM

## 2017-06-03 DIAGNOSIS — Z638 Other specified problems related to primary support group: Secondary | ICD-10-CM

## 2017-06-03 DIAGNOSIS — Z818 Family history of other mental and behavioral disorders: Secondary | ICD-10-CM

## 2017-06-03 DIAGNOSIS — Z90710 Acquired absence of both cervix and uterus: Secondary | ICD-10-CM | POA: Diagnosis not present

## 2017-06-03 DIAGNOSIS — Z810 Family history of intellectual disabilities: Secondary | ICD-10-CM | POA: Diagnosis not present

## 2017-06-03 DIAGNOSIS — G4700 Insomnia, unspecified: Secondary | ICD-10-CM | POA: Diagnosis not present

## 2017-06-03 DIAGNOSIS — F3162 Bipolar disorder, current episode mixed, moderate: Secondary | ICD-10-CM | POA: Diagnosis not present

## 2017-06-03 DIAGNOSIS — Z560 Unemployment, unspecified: Secondary | ICD-10-CM

## 2017-06-03 DIAGNOSIS — M549 Dorsalgia, unspecified: Secondary | ICD-10-CM

## 2017-06-03 DIAGNOSIS — Z811 Family history of alcohol abuse and dependence: Secondary | ICD-10-CM | POA: Diagnosis not present

## 2017-06-03 MED ORDER — ARIPIPRAZOLE 10 MG PO TABS: 10.0000 mg | ORAL_TABLET | Freq: Every day | ORAL | 2 refills | Status: DC

## 2017-06-03 MED ORDER — ESCITALOPRAM OXALATE 20 MG PO TABS: 20.0000 mg | ORAL_TABLET | Freq: Every day | ORAL | 2 refills | Status: DC

## 2017-06-03 MED ORDER — TRAZODONE HCL 100 MG PO TABS: 100.0000 mg | ORAL_TABLET | Freq: Every day | ORAL | 2 refills | Status: DC

## 2017-06-03 MED ORDER — ALPRAZOLAM 1 MG PO TABS: 1.0000 mg | ORAL_TABLET | Freq: Three times a day (TID) | ORAL | 1 refills | Status: DC | PRN
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
## 2017-06-22 ENCOUNTER — Ambulatory Visit (INDEPENDENT_AMBULATORY_CARE_PROVIDER_SITE_OTHER): Payer: Medicare Other | Admitting: Orthopaedic Surgery

## 2017-06-22 ENCOUNTER — Encounter: Payer: Self-pay | Admitting: Orthopaedic Surgery

## 2017-06-22 VITALS — BP 128/83 | HR 59 | Temp 97.9°F | Ht 65.0 in | Wt 270.0 lb

## 2017-06-22 DIAGNOSIS — M25561 Pain in right knee: Secondary | ICD-10-CM

## 2017-06-22 DIAGNOSIS — M25562 Pain in left knee: Secondary | ICD-10-CM | POA: Diagnosis not present

## 2017-06-22 DIAGNOSIS — G8929 Other chronic pain: Secondary | ICD-10-CM | POA: Diagnosis not present

## 2017-06-22 MED ORDER — OXYCODONE-ACETAMINOPHEN 5-325 MG PO TABS: 1.0000 | ORAL_TABLET | Freq: Four times a day (QID) | ORAL | 0 refills | Status: DC | PRN

## 2017-06-22 NOTE — Progress Notes (Signed)
[name], female DOB:January 05, 1961, 57 y.o. HKV:[id number]  Chief Complaint  Patient presents with  . Follow-up    Recheck on knees.    HPI  [name] is a 57 y.o. female who has chronic pain of both knees.  The cold weather has made her worse.  She has popping and swelling.  She has no giving way.  She is taking her medicine.  She does not want an injection today but wants to come back next month and get them.  I told her I would not be here then, Dr. Aline Brochure can see her.She is agreeable to this. HPI  Body mass index is 44.93 kg/m.  ROS  Review of Systems  Constitutional:       Patient has Diabetes Mellitus. Patient has hypertension. Patient does not have COPD or shortness of breath. Patient has BMI > 35. Patient does not have current smoking history.  HENT: Negative for congestion.   Respiratory: Positive for cough. Negative for shortness of breath.   Endocrine: Positive for cold intolerance.  Musculoskeletal: Positive for arthralgias, gait problem and joint swelling.  Allergic/Immunologic: Positive for environmental allergies.  Neurological: Positive for headaches.  Psychiatric/Behavioral: The patient is nervous/anxious.     Past Medical History:  Diagnosis Date  . Adenomatous polyp 2009  . Anemia 2003 after surgery needed 1 unit blood  . Anxiety   . Arthritis   . Bipolar 1 disorder ([employer])   . Constipation   . Depression   . Diabetes mellitus   . Diabetes mellitus, type II ([place])   . Diverticula of colon 2009  . Generalized headaches   . GERD (gastroesophageal reflux disease)   . History of hiatal hernia    small  . Hypertension   . Migraine   . Obesity   . Pelvic floor dysfunction    abnormal anorectal manometry at [facility] in 2009  . Plantar fasciitis both feet  . Psoriasis   . Sleep apnea    cpap setting of 3.5    Past Surgical History:  Procedure Laterality Date  . ABDOMINAL HYSTERECTOMY     compete  . CATARACT EXTRACTION  W/PHACO Right 02/15/2017   Procedure: CATARACT EXTRACTION PHACO AND INTRAOCULAR LENS PLACEMENT (IOC);  Surgeon: Rutherford Guys, MD;  Location: [facility];  Service: Ophthalmology;  Laterality: Right;  CDE: 4.18  . CATARACT EXTRACTION W/PHACO Left 03/01/2017   Procedure: CATARACT EXTRACTION PHACO AND INTRAOCULAR LENS PLACEMENT (IOC);  Surgeon: Rutherford Guys, MD;  Location: [facility];  Service: Ophthalmology;  Laterality: Left;  CDE: 2.56  . COLON RESECTION    03/30/2002   with end-colostomy and Hartmann's pouch  . COLON SURGERY     complicated diverticulitis requiring sigmoid resection with colostomy and subsequent takedown  . COLONOSCOPY  12/11/2007   Dr. Gala Romney- marginal prep, normal rectum pancolonic diverticula, adenomatous polyp  . COLONOSCOPY  08/28/2003      Wide open colonic anastomosis/Scattered diverticula noted throughout colon/ Small external hemorrhoids  . COLONOSCOPY  04/2012   [facility]: hyperplastic polyps, diverticulosis, ileocolonic anastomosis.  . COLONOSCOPY WITH PROPOFOL N/A 06/07/2016   Procedure: COLONOSCOPY WITH PROPOFOL;  Surgeon: Daneil Dolin, MD;  Location: [facility];  Service: Endoscopy;  Laterality: N/A;  7:30 am  . COLOSTOMY CLOSURE  07/10/2002  . ESOPHAGOGASTRODUODENOSCOPY (EGD) WITH PROPOFOL N/A 08/23/2016   Procedure: ESOPHAGOGASTRODUODENOSCOPY (EGD) WITH PROPOFOL;  Surgeon: Alphonsa Overall, MD;  Location: [facility];  Service: General;  Laterality: N/A;  . FLEXIBLE SIGMOIDOSCOPY  01/20/2012   [facility]: incomplete/attempted  colonoscopy. Inadequate prep precluded examination  . HEEL SPUR SURGERY Left 09/11/2013   both have been done  . KNEE ARTHROSCOPY  02/04/2004    left knee/partial medial meniscectomy.  Marland Kitchen KNEE SURGERY     3 arthroscopic  2 on left 1 on right  . LAPAROSCOPIC GASTRIC BANDING  2009  . LAPAROSCOPIC SALPINGOOPHERECTOMY  03/30/2002  . TUBAL LIGATION      Family History  Problem Relation Age of Onset  . Ovarian cancer Mother   . Heart disease Mother   .  Anxiety disorder Mother   . Cirrhosis Father        deceased age 24  . Alcohol abuse Father   . Liver cancer Cousin        age 42, deceased  . Drug abuse Cousin   . ADD / ADHD Son   . Anxiety disorder Sister   . Dementia Maternal Grandfather   . Colon cancer Other        aunt, deceased age 75  . Breast cancer Other        aunt, deceased age 84  . Bipolar disorder Neg Hx   . Depression Neg Hx   . OCD Neg Hx   . Paranoid behavior Neg Hx   . Schizophrenia Neg Hx   . Seizures Neg Hx   . Sexual abuse Neg Hx   . Physical abuse Neg Hx     Social History Social History   Tobacco Use  . Smoking status: Never Smoker  . Smokeless tobacco: Never Used  Substance Use Topics  . Alcohol use: Yes    Alcohol/week: 0.0 oz    Comment: occasionally wine  . Drug use: No    Allergies  Allergen Reactions  . Bactrim Itching, Nausea And Vomiting and Other (See Comments)    Redness  . Neurontin [Gabapentin] Palpitations and Other (See Comments)    confused    Current Outpatient Medications  Medication Sig Dispense Refill  . ALPRAZolam (XANAX) 1 MG tablet Take 1 tablet (1 mg total) by mouth 3 (three) times daily as needed for anxiety. 270 tablet 1  . ARIPiprazole (ABILIFY) 10 MG tablet Take 1 tablet (10 mg total) by mouth at bedtime. 90 tablet 2  . cycloSPORINE (RESTASIS) 0.05 % ophthalmic emulsion Place 2 drops into both eyes 2 (two) times daily.     . diclofenac (VOLTAREN) 75 MG EC tablet Take 75 mg by mouth 2 (two) times daily.    Marland Kitchen escitalopram (LEXAPRO) 20 MG tablet Take 1 tablet (20 mg total) by mouth daily. 90 tablet 2  . exenatide (BYETTA) 10 MCG/0.04ML SOPN injection Inject 10 mcg into the skin 2 (two) times daily with a meal.    . fluticasone (FLONASE) 50 MCG/ACT nasal spray Place 2 sprays into the nose daily as needed for allergies.     Marland Kitchen linaclotide (LINZESS) 290 MCG CAPS capsule Take 1 capsule (290 mcg total) by mouth daily. (Patient taking differently: Take 290 mcg by mouth  daily as needed (constipation). ) 90 capsule 3  . lisinopril (PRINIVIL,ZESTRIL) 5 MG tablet     . loratadine (CLARITIN) 10 MG tablet Take 10 mg by mouth daily.    . meclizine (ANTIVERT) 25 MG tablet Take 25 mg by mouth daily as needed for dizziness.   2  . meloxicam (MOBIC) 15 MG tablet Take 1 tablet (15 mg total) by mouth daily. 30 tablet 5  . metFORMIN (GLUCOPHAGE) 500 MG tablet Take 500 mg by mouth 2 (two) times daily.     Marland Kitchen  methocarbamol (ROBAXIN) 500 MG tablet Take 500 mg by mouth 3 (three) times daily as needed for muscle spasms.     . Multiple Vitamin (MULTIVITAMIN PO) Take 1 tablet by mouth daily.     . ondansetron (ZOFRAN ODT) 4 MG disintegrating tablet Take 1 tablet (4 mg total) by mouth every 8 (eight) hours as needed for nausea or vomiting. 10 tablet 0  . oxyCODONE-acetaminophen (PERCOCET/ROXICET) 5-325 MG tablet Take 1 tablet by mouth every 6 (six) hours as needed for moderate pain or severe pain (Must last 30 days.). 120 tablet 0  . pantoprazole (PROTONIX) 40 MG tablet Take 1 tablet (40 mg total) by mouth 2 (two) times daily. 180 tablet 3  . polyethylene glycol (MIRALAX / GLYCOLAX) packet Take 17 g by mouth daily.     Marland Kitchen Propylene Glycol-Glycerin (SOOTHE) 0.6-0.6 % SOLN Place 1 drop into both eyes 2 (two) times daily.    . Psyllium (METAMUCIL PO) Take 1 each by mouth 2 (two) times daily.     . rizatriptan (MAXALT-MLT) 10 MG disintegrating tablet Take 10 mg by mouth as needed for migraine. May repeat in 2 hours if needed    . traMADol (ULTRAM) 50 MG tablet Take 1 tablet (50 mg total) by mouth every 6 (six) hours as needed. 60 tablet 3  . traZODone (DESYREL) 100 MG tablet Take 1 tablet (100 mg total) by mouth at bedtime. 90 tablet 2   No current facility-administered medications for this visit.      Physical Exam  Blood pressure 128/83, pulse (!) 59, temperature 97.9 F (36.6 C), height 5\' 5"  (1.651 m), weight 270 lb (122.5 kg).  Constitutional: overall normal hygiene, normal  nutrition, well developed, normal grooming, normal body habitus. Assistive device:none  Musculoskeletal: gait and station Limp right, muscle tone and strength are normal, no tremors or atrophy is present.  .  Neurological: coordination overall normal.  Deep tendon reflex/nerve stretch intact.  Sensation normal.  Cranial nerves II-XII intact.   Skin:   Normal overall no scars, lesions, ulcers or rashes. No psoriasis.  Psychiatric: Alert and oriented x 3.  Recent memory intact, remote memory unclear.  Normal mood and affect. Well groomed.  Good eye contact.  Cardiovascular: overall no swelling, no varicosities, no edema bilaterally, normal temperatures of the legs and arms, no clubbing, cyanosis and good capillary refill.  Lymphatic: palpation is normal.  The bilateral lower extremity is examined:  Inspection:  Thigh:  Non-tender and no defects  Knee has swelling 1+ effusion.                        Joint tenderness is present                        Patient is tender over the medial joint line  Lower Leg:  Has normal appearance and no tenderness or defects  Ankle:  Non-tender and no defects  Foot:  Non-tender and no defects Range of Motion:  Knee:  Range of motion is: 0-105 right, 0 to 110 left                        Crepitus is  present  Ankle:  Range of motion is normal. Strength and Tone:  The bilateral lower extremity has normal strength and tone. Stability:  Knee:  The knee is stable.  Ankle:  The ankle is stable.   All other systems reviewed and  are negative   The patient has been educated about the nature of the problem(s) and counseled on treatment options.  The patient appeared to understand what I have discussed and is in agreement with it.  Encounter Diagnoses  Name Primary?  . Chronic pain of right knee Yes  . Chronic pain of left knee   . Morbid obesity due to excess calories [facility])     PLAN Call if any problems.  Precautions discussed.  Continue current  medications.   Return to clinic 1 month   I have reviewed the [place] web site prior to prescribing narcotic medicine for this patient.  Electronically Signed Sanjuana Kava, MD 2/13/20191:44 PM

## 2017-07-20 ENCOUNTER — Ambulatory Visit (INDEPENDENT_AMBULATORY_CARE_PROVIDER_SITE_OTHER): Payer: Medicare Other | Admitting: Orthopedic Surgery

## 2017-07-20 ENCOUNTER — Encounter: Payer: Self-pay | Admitting: Orthopedic Surgery

## 2017-07-20 VITALS — BP 110/69 | HR 76 | Ht 65.0 in | Wt 264.0 lb

## 2017-07-20 DIAGNOSIS — G8929 Other chronic pain: Secondary | ICD-10-CM | POA: Diagnosis not present

## 2017-07-20 DIAGNOSIS — M25561 Pain in right knee: Secondary | ICD-10-CM | POA: Diagnosis not present

## 2017-07-20 DIAGNOSIS — M25562 Pain in left knee: Secondary | ICD-10-CM

## 2017-07-20 NOTE — Progress Notes (Signed)
No chief complaint on file.   hpi [name] is a 57 y.o. female who has chronic pain of both knees.  The cold weather has made her worse.  She has popping and swelling.  She has no giving way.  She is taking her medicine.  She does not want an injection today but wants to come back next month and get them.  I told her I would not be here then, Dr. Aline Brochure can see her.She is agreeable to this. HPI   Body mass index is 44.93 kg/m.  Dr. Luna Glasgow is left instructions for this patient had bilateral knee injection   Procedure note left knee injection verbal consent was obtained to inject left knee joint  Timeout was completed to confirm the site of injection  The medications used were 40 mg of Depo-Medrol and 1% lidocaine 3 cc  Anesthesia was provided by ethyl chloride and the skin was prepped with alcohol.  After cleaning the skin with alcohol a 20-gauge needle was used to inject the left knee joint. There were no complications. A sterile bandage was applied.   Procedure note right knee injection verbal consent was obtained to inject right knee joint  Timeout was completed to confirm the site of injection  The medications used were 40 mg of Depo-Medrol and 1% lidocaine 3 cc  Anesthesia was provided by ethyl chloride and the skin was prepped with alcohol.  After cleaning the skin with alcohol a 20-gauge needle was used to inject the right knee joint. There were no complications. A sterile bandage was applied.   Encounter Diagnoses  Name Primary?  . Chronic pain of right knee Yes  . Chronic pain of left knee   . Morbid obesity due to excess calories ([facility])     3 months fu

## 2017-08-18 ENCOUNTER — Other Ambulatory Visit: Payer: Self-pay | Admitting: Orthopaedic Surgery

## 2017-08-18 MED ORDER — OXYCODONE-ACETAMINOPHEN 5-325 MG PO TABS: 1.0000 | ORAL_TABLET | Freq: Four times a day (QID) | ORAL | 0 refills | Status: DC | PRN

## 2017-09-26 ENCOUNTER — Telehealth: Payer: Self-pay | Admitting: Neurology

## 2017-09-26 NOTE — Telephone Encounter (Signed)
Patient called to schedule a CPAP follow up appointment with Dr. Rexene Alberts. Patient had an appointment on 02-02-16 but was late and could not be seen. Jinny Blossom spoke to patient on 02-09-16 regarding this and patient stated she will no longer follow up with our office. Patient called today and wanted to make an appointment with Dr. Rexene Alberts. I spoke with Jinny Blossom and she advised since patient had not been dismissed she can be scheduled. I called patient to schedule an appointment with Dr. Rexene Alberts but got VM and left message for a returned call.

## 2017-10-12 ENCOUNTER — Ambulatory Visit (INDEPENDENT_AMBULATORY_CARE_PROVIDER_SITE_OTHER): Payer: Medicare Other | Admitting: Orthopaedic Surgery

## 2017-10-12 ENCOUNTER — Encounter: Payer: Self-pay | Admitting: Orthopaedic Surgery

## 2017-10-12 DIAGNOSIS — M25561 Pain in right knee: Secondary | ICD-10-CM | POA: Diagnosis not present

## 2017-10-12 DIAGNOSIS — M25562 Pain in left knee: Secondary | ICD-10-CM | POA: Diagnosis not present

## 2017-10-12 DIAGNOSIS — G8929 Other chronic pain: Secondary | ICD-10-CM | POA: Diagnosis not present

## 2017-10-12 MED ORDER — OXYCODONE-ACETAMINOPHEN 5-325 MG PO TABS: 1.0000 | ORAL_TABLET | Freq: Four times a day (QID) | ORAL | 0 refills | Status: DC | PRN

## 2017-10-12 NOTE — Progress Notes (Signed)
CC: Both of my knees are hurting. I would like an injection in both knees.  The patient has had chronic pain and tenderness of both knees for some time.  Injections help.  There is no locking or giving way of the knee.  There is no new trauma. There is no redness or signs of infections.  The knees have a mild effusion and some crepitus.  There is no redness or signs of recent trauma.  Right knee ROM is 0-105 and left knee ROM is 0-110.  Impression:  Chronic pain of the both knees  Return:  1 month  PROCEDURE NOTE:  The patient requests injections of both knees, verbal consent was obtained.  The left and right knee were individually prepped appropriately after time out was performed.   Sterile technique was observed and injection of 1 cc of Depo-Medrol 40 mg with several cc's of plain xylocaine. Anesthesia was provided by ethyl chloride and a 20-gauge needle was used to inject each knee area. The injections were tolerated well.  A band aid dressing was applied.  The patient was advised to apply ice later today and tomorrow to the injection sight as needed.  I have reviewed the [place] web site prior to prescribing narcotic medicine for this patient.  Electronically Signed Sanjuana Kava, MD 6/5/20191:42 PM

## 2017-10-19 ENCOUNTER — Ambulatory Visit: Payer: Self-pay | Admitting: Orthopaedic Surgery

## 2017-10-28 ENCOUNTER — Ambulatory Visit (INDEPENDENT_AMBULATORY_CARE_PROVIDER_SITE_OTHER): Payer: Medicare Other | Admitting: Psychiatry

## 2017-10-28 ENCOUNTER — Encounter (HOSPITAL_COMMUNITY): Payer: Self-pay | Admitting: Psychiatry

## 2017-10-28 VITALS — BP 109/74 | HR 64 | Ht 65.0 in | Wt 265.0 lb

## 2017-10-28 DIAGNOSIS — F3162 Bipolar disorder, current episode mixed, moderate: Secondary | ICD-10-CM | POA: Diagnosis not present

## 2017-10-28 MED ORDER — ARIPIPRAZOLE 10 MG PO TABS: 10.0000 mg | ORAL_TABLET | Freq: Every day | ORAL | 2 refills | Status: DC

## 2017-10-28 MED ORDER — TEMAZEPAM 30 MG PO CAPS: 30.0000 mg | ORAL_CAPSULE | Freq: Every evening | ORAL | 2 refills | Status: DC | PRN

## 2017-10-28 MED ORDER — ALPRAZOLAM 1 MG PO TABS: 1.0000 mg | ORAL_TABLET | Freq: Three times a day (TID) | ORAL | 1 refills | Status: DC | PRN

## 2017-10-28 MED ORDER — ESCITALOPRAM OXALATE 20 MG PO TABS: 20.0000 mg | ORAL_TABLET | Freq: Every day | ORAL | 2 refills | Status: DC
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
## 2017-11-18 ENCOUNTER — Other Ambulatory Visit: Payer: Self-pay | Admitting: Orthopaedic Surgery

## 2017-11-21 MED ORDER — OXYCODONE-ACETAMINOPHEN 5-325 MG PO TABS: 1.0000 | ORAL_TABLET | Freq: Four times a day (QID) | ORAL | 0 refills | Status: DC | PRN

## 2017-11-23 ENCOUNTER — Telehealth: Payer: Self-pay

## 2017-11-23 ENCOUNTER — Encounter: Payer: Self-pay | Admitting: Neurology

## 2017-11-23 NOTE — Telephone Encounter (Signed)
I called pt to remind her to bring her cpap to the appt tomorrow with Dr. Rexene Alberts. No answer, left a message asking her to call me back. If pt calls back, please remind her to bring her cpap.

## 2017-11-24 ENCOUNTER — Encounter: Payer: Self-pay | Admitting: Neurology

## 2017-11-24 ENCOUNTER — Ambulatory Visit (INDEPENDENT_AMBULATORY_CARE_PROVIDER_SITE_OTHER): Payer: Medicare Other | Admitting: Neurology

## 2017-11-24 VITALS — BP 149/92 | HR 73 | Ht 66.0 in | Wt 267.0 lb

## 2017-11-24 DIAGNOSIS — G4733 Obstructive sleep apnea (adult) (pediatric): Secondary | ICD-10-CM | POA: Diagnosis not present

## 2017-11-24 DIAGNOSIS — Z9989 Dependence on other enabling machines and devices: Secondary | ICD-10-CM

## 2017-11-24 NOTE — Progress Notes (Signed)
Subjective:    Patient ID: [name] is a 57 y.o. female.  HPI     Interim history:   [name] is a 57 year old right-handed woman with a complex medical history of bipolar disorder, migraine headaches, obesity, s/p lap band surgery, memory problems, colonic polyps, diabetes, anxiety, reflux disease, constipation and osteoarthritis, who presents for followup consultation of her obstructive sleep apnea, on CPAP therapy. She is unaccompanied today. I last saw her on 01/30/2015, at which time she reported doing well with CPAP. She was compliant with treatment and I suggested a one-year checkup.  Today, 11/24/2017: I reviewed her CPAP compliance data from 10/25/2017 through 11/23/2017 which is a total of 30 days, during which time she used her CPAP 27 days with percent used days greater than 4 hours at 83%, indicating very good compliance with an average usage of 5 hours and 38 minutes, residual AHI at goal at 0.8 per hour, leak on the high side with the 95th percentile at 30 L/m on a pressure of 10 cm with EPR of 3. She reports that CPAP is going well, she needs new supplies sent to her DME company[facility]. She reports not typically sleeping very well. She sleeps about 5 hours on average. She takes temazepam at night. She has some residual daytime somnolence but understands that she also takes some sedating medications including Xanax 3 times a day, Abilify at night, oxycodone.  The patient's allergies, current medications, family history, past medical history, past social history, past surgical history and problem list were reviewed and updated as appropriate.   Previously (copied from previous notes for reference):   I saw her on 01/25/2014, at which time she reported ongoing good results with CPAP. She was compliant with treatment. She had recent left plantar fasciitis surgery. She was complaining of bilateral knee pain.   I reviewed her CPAP compliance data from  12/29/2014 through 01/27/2015 which is a total of 30 days during which time she used her machine every night with percent used days greater than 4 hours at 100%, indicating superb compliance with an average usage of 7 hours and 58 minutes, residual AHI low at 1 per hour, leak low for the 95th percentile at 5.7 L/m on a pressure of 10 cm with EPR of 3.     I first met her at the request of Dr. Jannifer Franklin on 07/09/2013, at which time we talked about her sleep studies including her baseline and her CPAP titration study as well as her CPAP compliance data. She reported having been able to dream since being on CPAP therapy and she had less daytime somnolence. She had been able to sleep in her bed as opposed to the recliner and felt that her sleep quality was much better. She had reduced nocturia and also reduced RLS symptoms.   I reviewed her compliance data from 10/22/2013 2 11/20/2013 which is a total of 30 days during which time she was CPAP every night with percent used days greater than 4 hours at 90%, average usage of 6 hours and 45 minutes, residual AHI low at 0.9 per hour and leak low at 3.2 L per minute at the 95th percentile, pressure at 10 cm with EPR.    I reviewed her compliance data from 12/24/2013 to 01/22/2014 which is a total of 30 days during which time she used her CPAP every night, percent used days greater than 4 hours was 97%, indicating excellent compliance, average usage of 6 hours and  50 minutes. Residual AHI low at 1.2 per hour, leak low at 4.5 L per minute at the 95th percentile. Pressure at 10 cm with EPR of 3.   She had a baseline sleep study on 04/09/2013 which demonstrated a total AHI of 14.5 per hour, supine AHI of 22.6 per hour, and no REM sleep related AHI of 47.4 per hour with an oxyhemoglobin desaturation nadir of 74%. She had a CPAP titration study on 05/21/2013, which a sleep efficiency of 82.8%. Arousal index was normal. She had an increased percentage of stage II sleep, absence  of slow-wave sleep, and an increased percentage of REM sleep at 31.8% with a mildly prolonged REM latency of 134.5 minutes. She had mild periodic leg movements with very little arousals. She was titrated on CPAP using a nasal pillows mask. On a pressure of 10 cm her residual AHI was 5.2 per hour. I prescribed CPAP therapy for her.   I reviewed her compliance data from 06/08/2013 through 07/08/2013 which is a total of 31 days during which time she used CPAP every night. Percent used days greater than 4 hours was 94%, indicating excellent compliance. Residual AHI was 1.5 per hour and leak was very low. Pressure is 10 cm with CPR of 3. Average usage for all nights for 6 hours and 35 minutes.   Her Past Medical History Is Significant For: Past Medical History:  Diagnosis Date  . Adenomatous polyp 2009  . Anemia 2003 after surgery needed 1 unit blood  . Anxiety   . Arthritis   . Bipolar 1 disorder (HCC)   . Constipation   . Depression   . Diabetes mellitus   . Diabetes mellitus, type II (HCC)   . Diverticula of colon 2009  . Generalized headaches   . GERD (gastroesophageal reflux disease)   . History of hiatal hernia    small  . Hypertension   . Migraine   . Obesity   . Pelvic floor dysfunction    abnormal anorectal manometry at [facility] in 2009  . Plantar fasciitis both feet  . Psoriasis   . Sleep apnea    cpap setting of 3.5    Her Past Surgical History Is Significant For: Past Surgical History:  Procedure Laterality Date  . ABDOMINAL HYSTERECTOMY     compete  . CATARACT EXTRACTION W/PHACO Right 02/15/2017   Procedure: CATARACT EXTRACTION PHACO AND INTRAOCULAR LENS PLACEMENT (IOC);  Surgeon: Shapiro, Mark, MD;  Location: [facility];  Service: Ophthalmology;  Laterality: Right;  CDE: 4.18  . CATARACT EXTRACTION W/PHACO Left 03/01/2017   Procedure: CATARACT EXTRACTION PHACO AND INTRAOCULAR LENS PLACEMENT (IOC);  Surgeon: Shapiro, Mark, MD;  Location: [facility];  Service: Ophthalmology;   Laterality: Left;  CDE: 2.56  . COLON RESECTION    03/30/2002   with end-colostomy and Hartmann's pouch  . COLON SURGERY     complicated diverticulitis requiring sigmoid resection with colostomy and subsequent takedown  . COLONOSCOPY  12/11/2007   Dr. Rourk- marginal prep, normal rectum pancolonic diverticula, adenomatous polyp  . COLONOSCOPY  08/28/2003      Wide open colonic anastomosis/Scattered diverticula noted throughout colon/ Small external hemorrhoids  . COLONOSCOPY  04/2012   [facility]: hyperplastic polyps, diverticulosis, ileocolonic anastomosis.  . COLONOSCOPY WITH PROPOFOL N/A 06/07/2016   Procedure: COLONOSCOPY WITH PROPOFOL;  Surgeon: Robert M Rourk, MD;  Location: [facility];  Service: Endoscopy;  Laterality: N/A;  7:30 am  . COLOSTOMY CLOSURE  07/10/2002  . ESOPHAGOGASTRODUODENOSCOPY (EGD) WITH PROPOFOL N/A 08/23/2016     Procedure: ESOPHAGOGASTRODUODENOSCOPY (EGD) WITH PROPOFOL;  Surgeon: Alphonsa Overall, MD;  Location: [facility];  Service: General;  Laterality: N/A;  . FLEXIBLE SIGMOIDOSCOPY  01/20/2012   [facility]: incomplete/attempted colonoscopy. Inadequate prep precluded examination  . HEEL SPUR SURGERY Left 09/11/2013   both have been done  . KNEE ARTHROSCOPY  02/04/2004    left knee/partial medial meniscectomy.  Marland Kitchen KNEE SURGERY     3 arthroscopic  2 on left 1 on right  . LAPAROSCOPIC GASTRIC BANDING  2009  . LAPAROSCOPIC SALPINGOOPHERECTOMY  03/30/2002  . TUBAL LIGATION      Her Family History Is Significant For: Family History  Problem Relation Age of Onset  . Ovarian cancer Mother   . Heart disease Mother   . Anxiety disorder Mother   . Cirrhosis Father        deceased age 93  . Alcohol abuse Father   . Liver cancer Cousin        age 1, deceased  . Drug abuse Cousin   . ADD / ADHD Son   . Anxiety disorder Sister   . Dementia Maternal Grandfather   . Colon cancer Other        aunt, deceased age 72  . Breast cancer Other        aunt, deceased age 21  . Bipolar  disorder Neg Hx   . Depression Neg Hx   . OCD Neg Hx   . Paranoid behavior Neg Hx   . Schizophrenia Neg Hx   . Seizures Neg Hx   . Sexual abuse Neg Hx   . Physical abuse Neg Hx     Her Social History Is Significant For: Social History   Socioeconomic History  . Marital status: Married    Spouse name: Not on file  . Number of children: 2  . Years of education: college  . Highest education level: Not on file  Occupational History  . 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 Financial resource strain: Not on file  . Food insecurity:    Worry: Not on file    Inability: Not on file  . Transportation needs:    Medical: Not on file    Non-medical: Not on file  Tobacco Use  . Smoking status: Never Smoker  . Smokeless tobacco: Never Used  Substance and Sexual Activity  . Alcohol use: Yes    Comment: occasionally wine  . Drug use: No  . Sexual activity: Not Currently    Birth control/protection: Surgical  Lifestyle  . Physical activity:    Days per week: Not on file    Minutes per session: Not on file  . Stress: Not on file  Relationships  . Social connections:    Talks on phone: Not on file    Gets together: Not on file    Attends religious service: Not on file    Active member of club or organization: Not on file    Attends meetings of clubs or organizations: Not on file    Relationship status: Not on file  Other Topics Concern  . Not on file  Social History Narrative  . Not on file    Her Allergies Are:  Allergies  Allergen Reactions  . Bactrim Itching, Nausea And Vomiting and Other (See Comments)    Redness  . Neurontin [Gabapentin] Palpitations and Other (See Comments)    confused  :   Her Current Medications Are:  Outpatient  Encounter Medications as of 11/24/2017  Medication Sig  . ALPRAZolam (XANAX) 1 MG tablet Take 1 tablet (1 mg total) by mouth 3 (three) times daily as needed for anxiety.  .  ARIPiprazole (ABILIFY) 10 MG tablet Take 1 tablet (10 mg total) by mouth at bedtime.  . cycloSPORINE (RESTASIS) 0.05 % ophthalmic emulsion Place 2 drops into both eyes 2 (two) times daily.   . diclofenac (VOLTAREN) 75 MG EC tablet Take 75 mg by mouth 2 (two) times daily.  . escitalopram (LEXAPRO) 20 MG tablet Take 1 tablet (20 mg total) by mouth daily.  . exenatide (BYETTA) 10 MCG/0.04ML SOPN injection Inject 10 mcg into the skin 2 (two) times daily with a meal.  . fluticasone (FLONASE) 50 MCG/ACT nasal spray Place 2 sprays into the nose daily as needed for allergies.   . linaclotide (LINZESS) 290 MCG CAPS capsule Take 1 capsule (290 mcg total) by mouth daily. (Patient taking differently: Take 290 mcg by mouth daily as needed (constipation). )  . lisinopril (PRINIVIL,ZESTRIL) 5 MG tablet   . loratadine (CLARITIN) 10 MG tablet Take 10 mg by mouth daily.  . meclizine (ANTIVERT) 25 MG tablet Take 25 mg by mouth daily as needed for dizziness.   . meloxicam (MOBIC) 15 MG tablet Take 1 tablet (15 mg total) by mouth daily.  . metFORMIN (GLUCOPHAGE) 500 MG tablet Take 500 mg by mouth 2 (two) times daily.   . methocarbamol (ROBAXIN) 500 MG tablet Take 500 mg by mouth 3 (three) times daily as needed for muscle spasms.   . Multiple Vitamin (MULTIVITAMIN PO) Take 1 tablet by mouth daily.   . ondansetron (ZOFRAN ODT) 4 MG disintegrating tablet Take 1 tablet (4 mg total) by mouth every 8 (eight) hours as needed for nausea or vomiting.  . oxyCODONE-acetaminophen (PERCOCET/ROXICET) 5-325 MG tablet Take 1 tablet by mouth every 6 (six) hours as needed for moderate pain or severe pain (Must last 30 days.).  . pantoprazole (PROTONIX) 40 MG tablet Take 1 tablet (40 mg total) by mouth 2 (two) times daily.  . polyethylene glycol (MIRALAX / GLYCOLAX) packet Take 17 g by mouth daily.   . Propylene Glycol-Glycerin (SOOTHE) 0.6-0.6 % SOLN Place 1 drop into both eyes 2 (two) times daily.  . Psyllium (METAMUCIL PO) Take 1  each by mouth 2 (two) times daily.   . rizatriptan (MAXALT-MLT) 10 MG disintegrating tablet Take 10 mg by mouth as needed for migraine. May repeat in 2 hours if needed  . temazepam (RESTORIL) 30 MG capsule Take 1 capsule (30 mg total) by mouth at bedtime as needed for sleep.   No facility-administered encounter medications on file as of 11/24/2017.   :  Review of Systems:  Out of a complete 14 point review of systems, all are reviewed and negative with the exception of these symptoms as listed below:  Review of Systems  Neurological:       Pt presents today to discuss her cpap. Pt needs a cpap supply order sent to [facility].    Objective:  Neurological Exam  Physical Exam Physical Examination:   Vitals:   11/24/17 1031  BP: (!) 149/92  Pulse: 73    General Examination: The patient is a very pleasant 57 y.o. female in no acute distress. She appears well-developed and well-nourished and well groomed.   HEENT: Normocephalic, atraumatic, pupils are equal, round and reactive to light and accommodation. Extraocular tracking is good without limitation to gaze excursion or nystagmus noted. Normal smooth pursuit is   noted. Hearing is grossly intact. Face is symmetric with normal facial animation and normal facial sensation. Speech is clear with no dysarthria noted. There is no hypophonia. There is no lip, neck/head, jaw or voice tremor. Oropharynx exam reveals: moderate mouth dryness, adequate dental hygiene and moderate airway crowding. Mallampati is class II. Tongue protrudes centrally and palate elevates symmetrically.   Chest: Clear to auscultation without wheezing, rhonchi or crackles noted.  Heart: S1+S2+0, regular and normal without murmurs, rubs or gallops noted.   Abdomen: Soft, non-tender and non-distended with normal bowel sounds appreciated on auscultation.  Extremities: There is no pitting edema in the distal lower extremities bilaterally.   Skin: Warm and dry without trophic  changes noted. There are no varicose veins.  Musculoskeletal: exam reveals no obvious joint deformities, tenderness or joint swelling or erythema.   Neurologically:  Mental status: The patient is awake, alert and oriented in all 4 spheres. Her immediate and remote memory, attention, language skills and fund of knowledge are appropriate. There is no evidence of aphasia, agnosia, apraxia or anomia. Speech is clear with normal prosody and enunciation. Thought process is linear. Mood is normal and affect is normal.  Cranial nerves II - XII are as described above under HEENT exam. In addition: shoulder shrug is normal with equal shoulder height noted. Motor exam: Normal bulk, strength and tone is noted. There is no drift, tremor or rebound. Romberg is negative. Reflexes are 1+ in the UEs and trace in the LEs. Fine motor skills and coordination: grossly intact.  Cerebellar testing: No dysmetria or intention tremor on finger to nose testing. There is no truncal or gait ataxia.  Sensory exam: intact to light touch in the upper and lower extremities.  Gait, station and balance: She stands with difficulty. She walks with a single-point cane.   Assessment and Plan:   In summary, [name] is a very pleasant 57-year old female with a complex medical history of bipolar disorder, migraine headaches, obesity, s/p lap band surgery (with subsequent regaining of weight), memory problems, colonic polyps, diabetes, anxiety, reflux disease, diverticulitis, s/p partial colectomy in 2004 with chronic constipation and knee osteoarthritis with chronic residual pain, on chronic narcotic pain medications, as well as plantar fasciitis on the right, who presents for follow up of her OSA, on CPAP therapy on a pressure of 10 cm. She continues to be compliant with treatment. She needs new supplies. She has been using nasal pillows with good tolerance. She had good results after sleep study testing. Her testing with her  baseline sleep study was in December 2014 and CPAP titration study was in January 2015. We reviewed her most recent compliance data. She has lost weight. She weighed 280 pounds at the time of sleep study testing in December 2014. She may be eligible for a new CPAP machine by next year or so, we can also consider sleep study testing for reevaluation at the time because of her weight loss, especially if she continues to lose more weight. She is in agreement. For now, I suggested a one-year checkup with one of our nurse practitioners. I placed an order for CPAP supplies which we will fax to her DME company. I answered all her questions today and she was in agreement. I spent 20 minutes in total face-to-face time with the patient, more than 50% of which was spent in counseling and coordination of care, reviewing test results, reviewing medication and discussing or reviewing the diagnosis of OSA, its prognosis and treatment options.   Pertinent laboratory and imaging test results that were available during this visit with the patient were reviewed by me and considered in my medical decision making (see chart for details).   

## 2017-11-24 NOTE — Patient Instructions (Signed)
We will need to replace all your CPAP supplies, including, mask, hose, filter and headgear, I have placed an order for this and we will send it to your DME company; they should be in touch with you, you may also need a new humidifier unit.  Keep up the good work! We can see you in 1 year, you can see one of our nurse practitioners as you are stable. I will see you after that.

## 2017-11-30 ENCOUNTER — Ambulatory Visit (INDEPENDENT_AMBULATORY_CARE_PROVIDER_SITE_OTHER): Payer: Medicare Other | Admitting: Psychiatry

## 2017-11-30 ENCOUNTER — Encounter (HOSPITAL_COMMUNITY): Payer: Self-pay | Admitting: Psychiatry

## 2017-11-30 VITALS — BP 120/80 | HR 71 | Ht 66.0 in | Wt 269.0 lb

## 2017-11-30 DIAGNOSIS — Z811 Family history of alcohol abuse and dependence: Secondary | ICD-10-CM | POA: Diagnosis not present

## 2017-11-30 DIAGNOSIS — R450 Nervousness: Secondary | ICD-10-CM | POA: Diagnosis not present

## 2017-11-30 DIAGNOSIS — F3162 Bipolar disorder, current episode mixed, moderate: Secondary | ICD-10-CM

## 2017-11-30 DIAGNOSIS — Z818 Family history of other mental and behavioral disorders: Secondary | ICD-10-CM
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
## 2017-12-14 ENCOUNTER — Ambulatory Visit: Payer: Medicare Other | Admitting: Orthopaedic Surgery

## 2017-12-28 ENCOUNTER — Encounter: Payer: Self-pay | Admitting: Orthopaedic Surgery

## 2017-12-28 ENCOUNTER — Ambulatory Visit (INDEPENDENT_AMBULATORY_CARE_PROVIDER_SITE_OTHER): Payer: Medicare Other | Admitting: Orthopaedic Surgery

## 2017-12-28 VITALS — Wt 275.2 lb

## 2017-12-28 DIAGNOSIS — M25562 Pain in left knee: Secondary | ICD-10-CM | POA: Diagnosis not present

## 2017-12-28 DIAGNOSIS — G8929 Other chronic pain: Secondary | ICD-10-CM

## 2017-12-28 DIAGNOSIS — Z6841 Body Mass Index (BMI) 40.0 and over, adult: Secondary | ICD-10-CM

## 2017-12-28 DIAGNOSIS — M25561 Pain in right knee: Secondary | ICD-10-CM

## 2017-12-28 MED ORDER — OXYCODONE-ACETAMINOPHEN 5-325 MG PO TABS: 1.0000 | ORAL_TABLET | Freq: Four times a day (QID) | ORAL | 0 refills | Status: DC | PRN

## 2017-12-28 NOTE — Progress Notes (Addendum)
CC: Both of my knees are hurting. I would like an injection in both knees.  The patient has had chronic pain and tenderness of both knees for some time.  Injections help.  There is no locking or giving way of the knee.  There is no new trauma. There is no redness or signs of infections.  The knees have a mild effusion and some crepitus.  There is no redness or signs of recent trauma.  Right knee ROM is 0-100 and left knee ROM is 0-105.  Impression:  Chronic pain of the both knees  Return:  1 month  PROCEDURE NOTE:  The patient requests injections of both knees, verbal consent was obtained.  The left and right knee were individually prepped appropriately after time out was performed.   Sterile technique was observed and injection of 1 cc of Depo-Medrol 40 mg with several cc's of plain xylocaine. Anesthesia was provided by ethyl chloride and a 20-gauge needle was used to inject each knee area. The injections were tolerated well.  A band aid dressing was applied.  The patient was advised to apply ice later today and tomorrow to the injection sight as needed.  I have reviewed the [place] web site prior to prescribing narcotic medicine for this patient.  The patient meets the AMA guidelines for Morbid (severe) obesity with a BMI > 40.0 and I have recommended weight loss.  Encounter Diagnoses  Name Primary?  . Chronic pain of right knee Yes  . Chronic pain of left knee   . Body mass index 40.0-44.9, adult ([place])   . Morbid obesity due to excess calories [facility])      Electronically Signed Sanjuana Kava, MD 8/21/201910:35 AM

## 2017-12-29 ENCOUNTER — Ambulatory Visit: Payer: Medicare Other | Admitting: Orthopaedic Surgery

## 2018-01-06 ENCOUNTER — Other Ambulatory Visit (HOSPITAL_COMMUNITY): Payer: Self-pay | Admitting: Family Medicine

## 2018-01-06 DIAGNOSIS — Z1231 Encounter for screening mammogram for malignant neoplasm of breast: Secondary | ICD-10-CM

## 2018-01-25 ENCOUNTER — Encounter: Payer: Self-pay | Admitting: Orthopaedic Surgery

## 2018-01-25 ENCOUNTER — Ambulatory Visit (INDEPENDENT_AMBULATORY_CARE_PROVIDER_SITE_OTHER): Payer: Medicare Other | Admitting: Orthopaedic Surgery

## 2018-01-25 VITALS — BP 122/79 | HR 79 | Ht 66.0 in | Wt 275.0 lb

## 2018-01-25 DIAGNOSIS — Z6841 Body Mass Index (BMI) 40.0 and over, adult: Secondary | ICD-10-CM | POA: Diagnosis not present

## 2018-01-25 DIAGNOSIS — M25562 Pain in left knee: Secondary | ICD-10-CM

## 2018-01-25 DIAGNOSIS — G8929 Other chronic pain: Secondary | ICD-10-CM | POA: Diagnosis not present

## 2018-01-25 DIAGNOSIS — M25561 Pain in right knee: Secondary | ICD-10-CM | POA: Diagnosis not present

## 2018-01-25 MED ORDER — OXYCODONE-ACETAMINOPHEN 5-325 MG PO TABS: 1.0000 | ORAL_TABLET | Freq: Four times a day (QID) | ORAL | 0 refills | Status: DC | PRN
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
## 2018-01-30 ENCOUNTER — Ambulatory Visit (HOSPITAL_COMMUNITY): Payer: Medicare Other

## 2018-01-30 ENCOUNTER — Ambulatory Visit (HOSPITAL_COMMUNITY): Payer: Self-pay

## 2018-02-01 ENCOUNTER — Encounter (HOSPITAL_COMMUNITY): Payer: Self-pay | Admitting: Psychiatry

## 2018-02-01 ENCOUNTER — Ambulatory Visit (INDEPENDENT_AMBULATORY_CARE_PROVIDER_SITE_OTHER): Payer: Medicare Other | Admitting: Psychiatry

## 2018-02-01 VITALS — BP 122/84 | HR 66 | Ht 66.0 in

## 2018-02-01 DIAGNOSIS — F3162 Bipolar disorder, current episode mixed, moderate: Secondary | ICD-10-CM | POA: Diagnosis not present

## 2018-02-01 DIAGNOSIS — Z560 Unemployment, unspecified: Secondary | ICD-10-CM

## 2018-02-01 DIAGNOSIS — Z736 Limitation of activities due to disability: Secondary | ICD-10-CM

## 2018-02-01 MED ORDER — ALPRAZOLAM 1 MG PO TABS: 1.0000 mg | ORAL_TABLET | Freq: Three times a day (TID) | ORAL | 1 refills | Status: DC | PRN

## 2018-02-01 MED ORDER — TEMAZEPAM 30 MG PO CAPS: 30.0000 mg | ORAL_CAPSULE | Freq: Every evening | ORAL | 2 refills | Status: DC | PRN

## 2018-02-01 MED ORDER — ARIPIPRAZOLE 10 MG PO TABS: 10.0000 mg | ORAL_TABLET | Freq: Every day | ORAL | 2 refills | Status: DC

## 2018-02-01 MED ORDER — ESCITALOPRAM OXALATE 20 MG PO TABS: 20.0000 mg | ORAL_TABLET | Freq: Every day | ORAL | 2 refills | Status: DC
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
## 2018-02-16 ENCOUNTER — Encounter: Payer: Self-pay | Admitting: Orthopaedic Surgery

## 2018-02-16 ENCOUNTER — Ambulatory Visit (INDEPENDENT_AMBULATORY_CARE_PROVIDER_SITE_OTHER): Payer: Medicare Other

## 2018-02-16 ENCOUNTER — Ambulatory Visit (INDEPENDENT_AMBULATORY_CARE_PROVIDER_SITE_OTHER): Payer: Medicare Other | Admitting: Orthopaedic Surgery

## 2018-02-16 VITALS — BP 123/79 | HR 66 | Ht 66.0 in | Wt 275.0 lb

## 2018-02-16 DIAGNOSIS — M25562 Pain in left knee: Secondary | ICD-10-CM

## 2018-02-16 DIAGNOSIS — G8929 Other chronic pain: Secondary | ICD-10-CM | POA: Diagnosis not present

## 2018-02-16 DIAGNOSIS — Z6841 Body Mass Index (BMI) 40.0 and over, adult: Secondary | ICD-10-CM

## 2018-02-16 MED ORDER — DICLOFENAC SODIUM 75 MG PO TBEC: 75.0000 mg | DELAYED_RELEASE_TABLET | Freq: Two times a day (BID) | ORAL | 5 refills | Status: DC

## 2018-02-16 NOTE — Progress Notes (Signed)
CC:  I have pain of my left knee. I would like an injection.  The patient has chronic pain of the left knee.  There is no recent trauma.  There is no redness.  Injections in the past have helped.  The knee has no redness, has an effusion and crepitus present.  ROM of the left knee is 0-100.  Impression:  Chronic knee pain left  Return: 1 month  PROCEDURE NOTE:  The patient requests injections of the left knee, verbal consent was obtained.  The left knee was prepped appropriately after time out was performed.   Sterile technique was observed and injection of 1 cc of Depo-Medrol 40 mg with several cc's of plain xylocaine. Anesthesia was provided by ethyl chloride and a 20-gauge needle was used to inject the knee area. The injection was tolerated well.  A band aid dressing was applied.  The patient was advised to apply ice later today and tomorrow to the injection sight as needed.  She has run out of her diclofenac, I will refill.  I did x-rays of the left knee today.  Her DJD is worse.  She may need to consider arthroplasty.  Electronically Signed Sanjuana Kava, MD 10/10/201911:13 AM

## 2018-02-17 IMAGING — MR MR KNEE*R* W/O CM
4 of 6 series · 13 of 40 positions shown · non-contrast
Comparison: None.

CLINICAL DATA: Chronic knee pain with popping and grinding

EXAM:
MRI OF THE RIGHT KNEE WITHOUT CONTRAST
TECHNIQUE: Multiplanar, multisequence MR imaging of the knee was performed. No
intravenous contrast was administered.

[Series 3: pdfs axial · axial · 3.0mm · 0.20mm/px · z∈[-70,+16]mm · 3 of 34 slices shown]
[im 5/34]
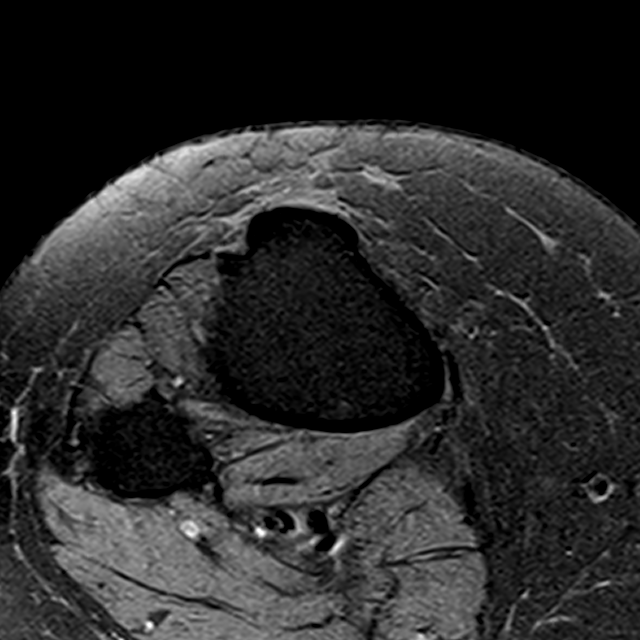
[im 19/34]
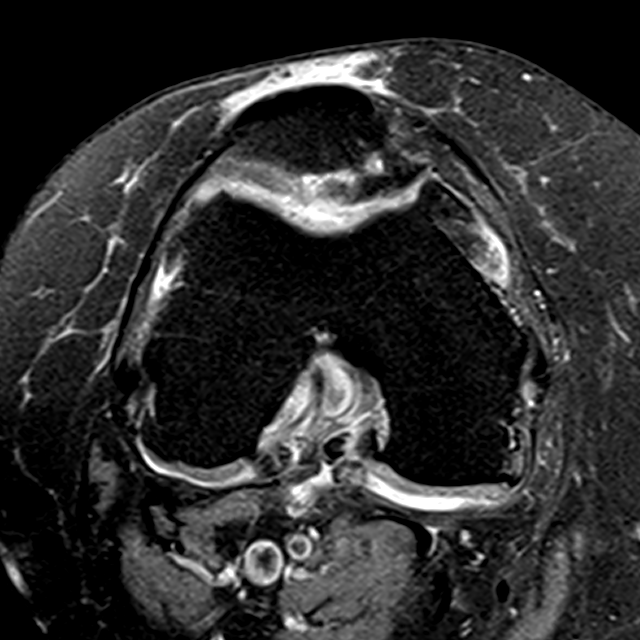
[im 29/34]
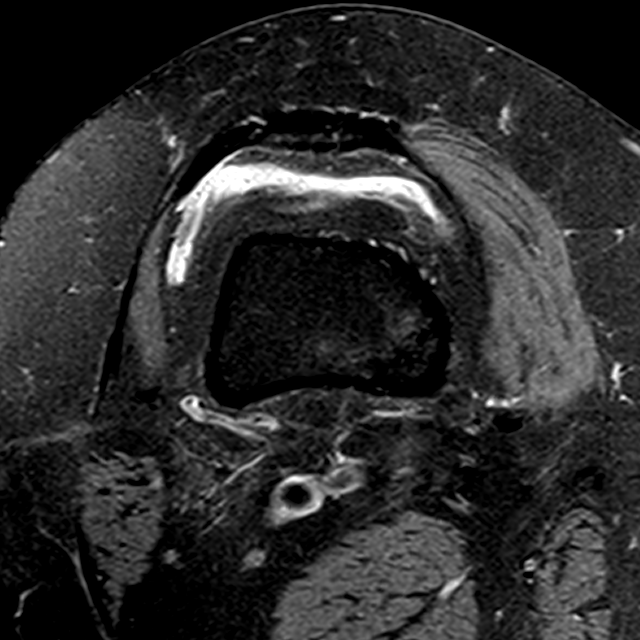

[Series 4: T1 · coronal · 3.0mm · 0.18mm/px · 4 of 28 slices shown]
[im 1/28]
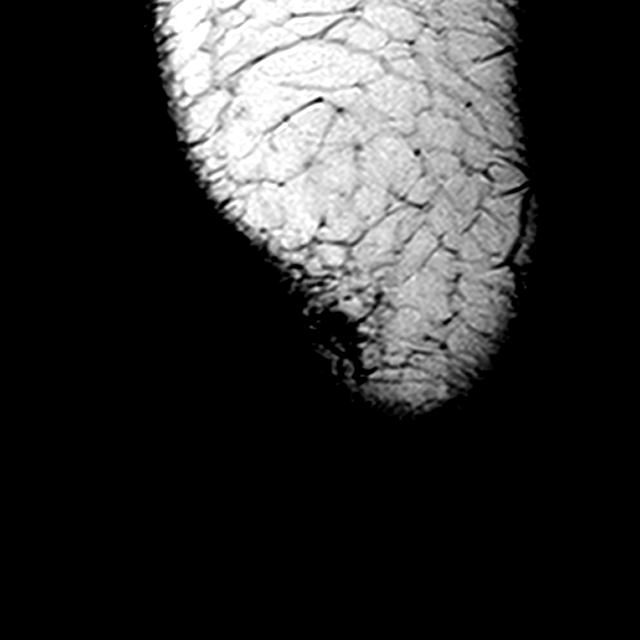
[im 5/28]
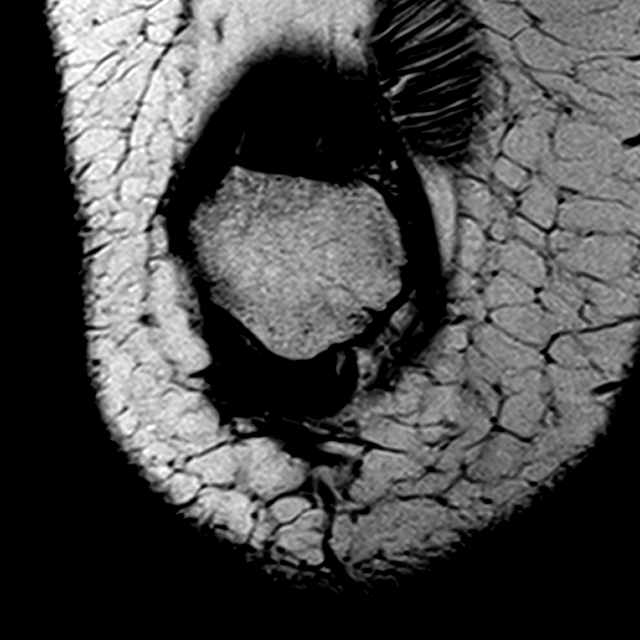
[im 14/28]
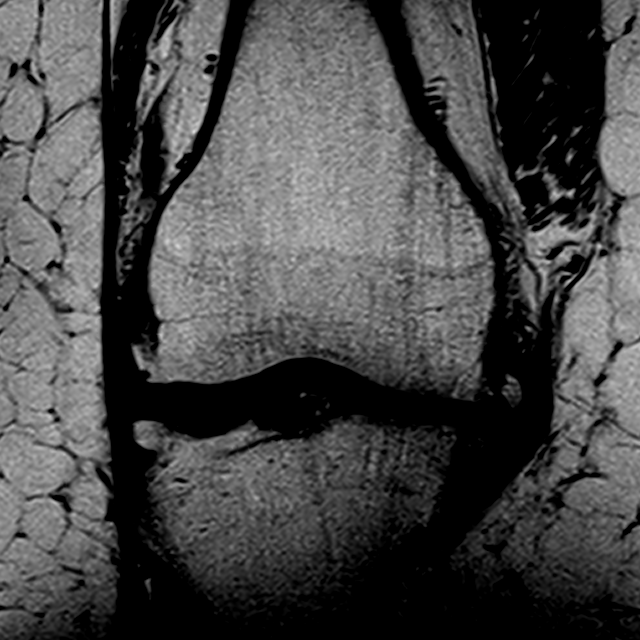
[im 23/28]
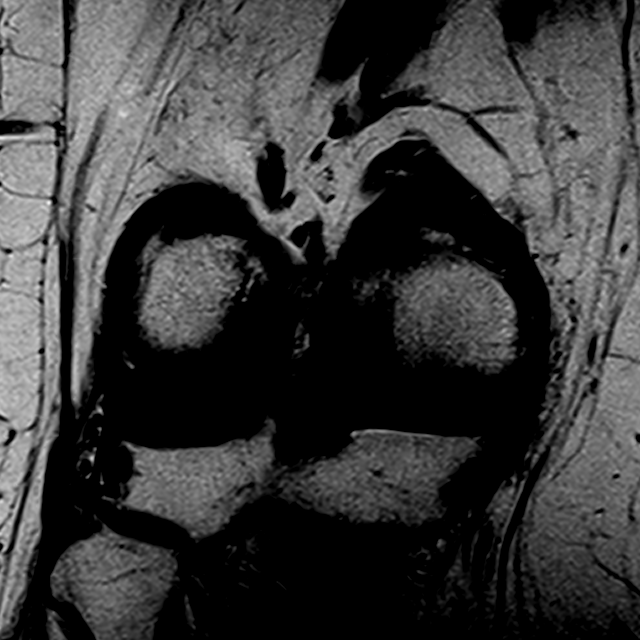

[Series 5: pdfs sag · sagittal · 3.0mm · 0.22mm/px · 3 of 29 slices shown]
[im 5/29]
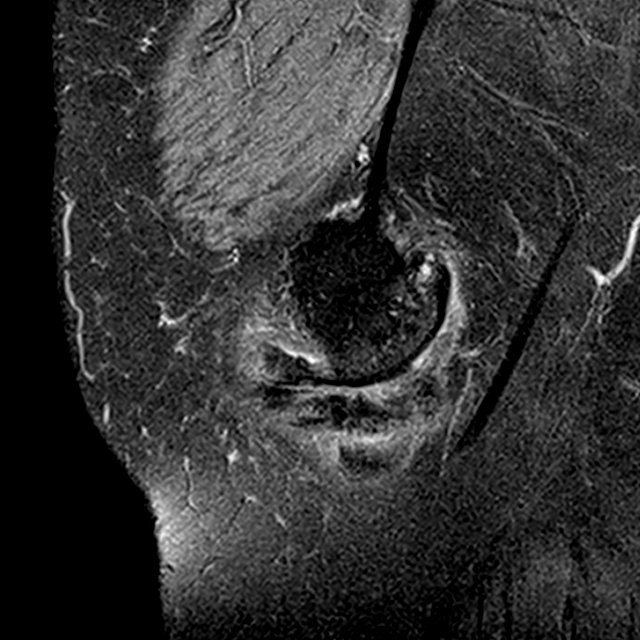
[im 15/29]
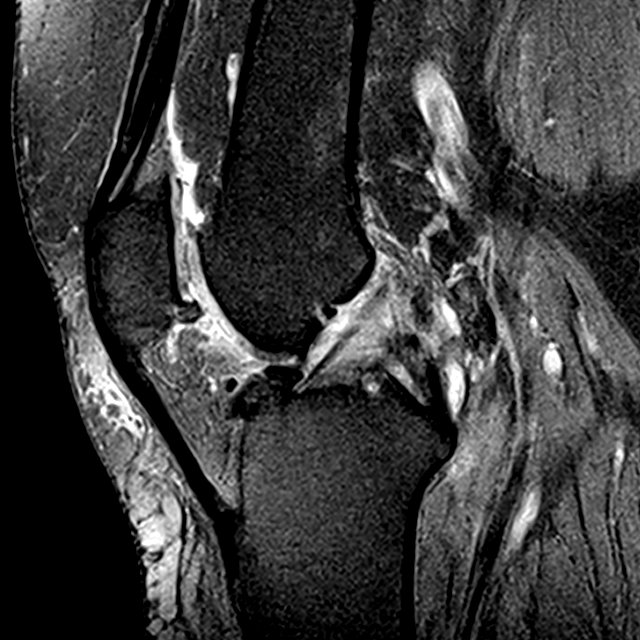
[im 24/29]
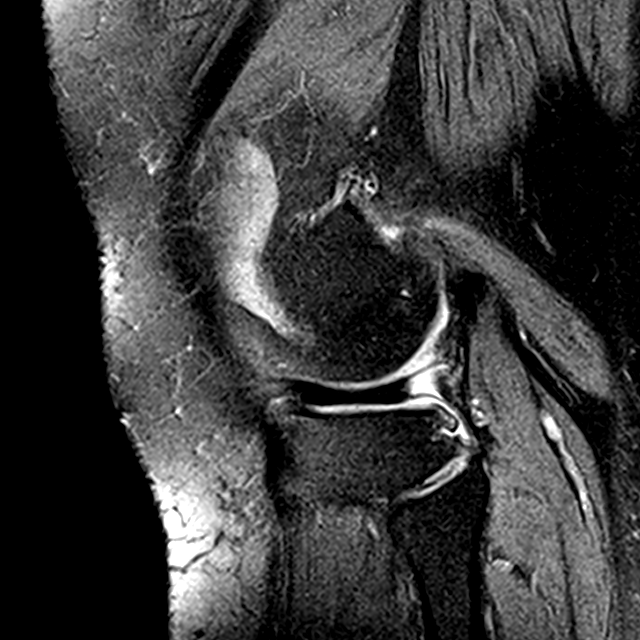

[Series 6: t2fs cor · coronal · 3.0mm · 0.19mm/px · 3 of 28 slices shown]
[im 5/28]
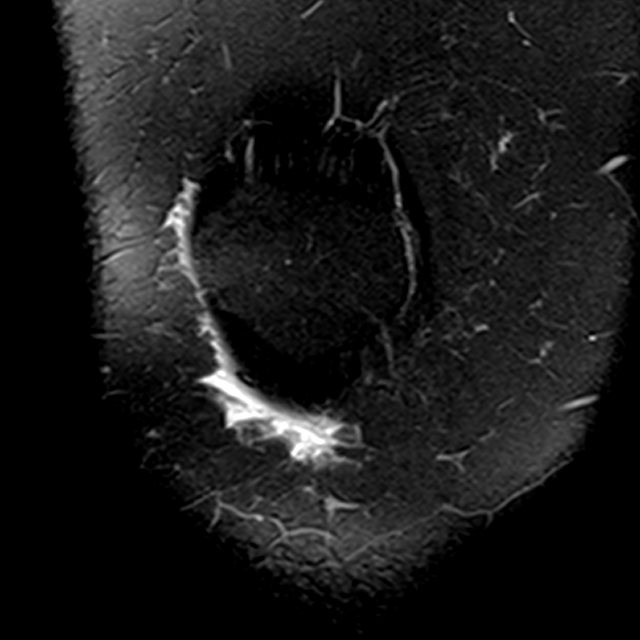
[im 14/28]
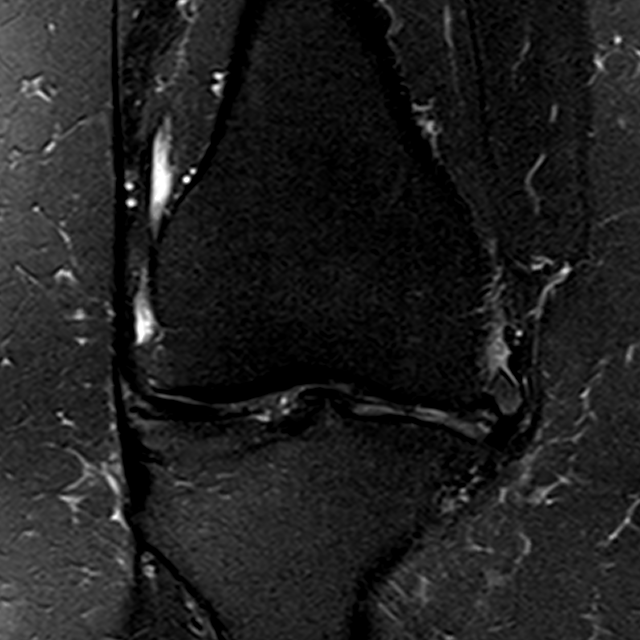
[im 23/28]
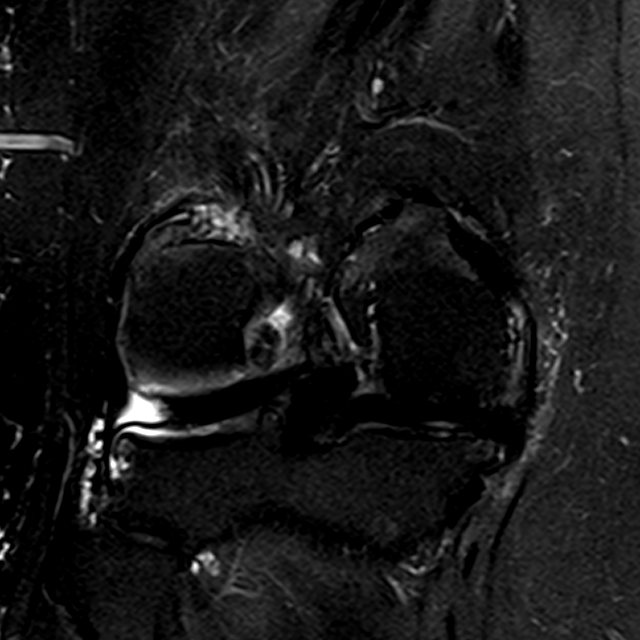

[13 of 40 positions shown; findings below may reference images not displayed]

FINDINGS: MENISCI

Medial meniscus: Severe attenuation of the body of the medial
meniscus consistent with severe tear. Complex tear of the posterior
horn of the medial meniscus.

Lateral meniscus:  Intact.

LIGAMENTS

Cruciates: Intact ACL and PCL. Increased signal within the ACL as
can be seen with mild mucinous degeneration.

Collaterals: Medial collateral ligament is intact. Lateral
collateral ligament complex is intact.

CARTILAGE

Patellofemoral: Partial thickness cartilage loss of the medial and
lateral patellofemoral compartments.

Medial: Extensive full-thickness cartilage loss of the medial
femoral condyle and medial tibial plateau with subchondral reactive
marrow changes.

Lateral: Chondromalacia of the lateral femoral condyle and lateral
tibial plateau. A full-thickness cartilage fissure involving the
lateral femoral condyle.

Joint: Small joint effusion. Normal Hoffa's fat. No plical
thickening. Multiple loose bodies in the posterior joint space just
posterior to the PCL.

Popliteal Fossa:  Normal Baker cyst.  Intact popliteus tendon.

Extensor Mechanism:  Intact.

Bones: No focal marrow signal abnormality. No fracture or
dislocation. Tricompartmental osteoarthritis.
IMPRESSION: 1. Severe attenuation of the body of the medial meniscus consistent
with severe tear. Complex tear of the posterior horn of the medial
meniscus.
2. Tricompartmental cartilage abnormalities as described above, most
severe in the medial femorotibial compartment.

## 2018-02-22 ENCOUNTER — Ambulatory Visit: Payer: Medicare Other | Admitting: Orthopaedic Surgery

## 2018-02-27 ENCOUNTER — Ambulatory Visit (HOSPITAL_COMMUNITY)
Admission: RE | Admit: 2018-02-27 | Discharge: 2018-02-27 | Disposition: A | Discharge status: Home / Self Care | Payer: Medicare Other | Source: Ambulatory Visit | Attending: Family Medicine | Admitting: Family Medicine

## 2018-02-27 DIAGNOSIS — Z1231 Encounter for screening mammogram for malignant neoplasm of breast: Secondary | ICD-10-CM

## 2018-03-14 ENCOUNTER — Encounter: Payer: Self-pay | Admitting: Orthopaedic Surgery

## 2018-03-14 ENCOUNTER — Ambulatory Visit (INDEPENDENT_AMBULATORY_CARE_PROVIDER_SITE_OTHER): Payer: Medicare Other | Admitting: Orthopaedic Surgery

## 2018-03-14 VITALS — BP 123/82 | HR 90 | Ht 66.0 in | Wt 286.0 lb

## 2018-03-14 DIAGNOSIS — Z6841 Body Mass Index (BMI) 40.0 and over, adult: Secondary | ICD-10-CM | POA: Diagnosis not present

## 2018-03-14 DIAGNOSIS — G8929 Other chronic pain: Secondary | ICD-10-CM | POA: Diagnosis not present

## 2018-03-14 DIAGNOSIS — M25562 Pain in left knee: Secondary | ICD-10-CM

## 2018-03-14 MED ORDER — OXYCODONE-ACETAMINOPHEN 5-325 MG PO TABS: 1.0000 | ORAL_TABLET | Freq: Four times a day (QID) | ORAL | 0 refills | Status: DC | PRN

## 2018-03-14 NOTE — Progress Notes (Signed)
CC:  I have pain of my left knee. I would like an injection.  The patient has chronic pain of the left knee.  There is no recent trauma.  There is no redness.  Injections in the past have helped.  The knee has no redness, has an effusion and crepitus present.  ROM of the left knee is 0-100.  Impression:  Chronic knee pain left  Return: 1 month  PROCEDURE NOTE:  The patient requests injections of the left knee, verbal consent was obtained.  The left knee was prepped appropriately after time out was performed.   Sterile technique was observed and injection of 1 cc of Depo-Medrol 40 mg with several cc's of plain xylocaine. Anesthesia was provided by ethyl chloride and a 20-gauge needle was used to inject the knee area. The injection was tolerated well.  A band aid dressing was applied.  The patient was advised to apply ice later today and tomorrow to the injection sight as needed.  The patient meets the AMA guidelines for Morbid (severe) obesity with a BMI > 40.0 and I have recommended weight loss.  I have reviewed the [facility] web site prior to prescribing narcotic medicine for this patient.   .The patient has read and signed an Opioid Treatment Agreement which has been scanned and added to the medical record.  The patient understands the agreement and agrees to abide with it.  The patient has chronic pain that is being treated with an opioid which relieves the pain.  The patient understands potential complications with chronic opioid treatment.   Electronically Signed Sanjuana Kava, MD 11/5/20192:31 PM

## 2018-03-16 ENCOUNTER — Ambulatory Visit: Payer: Medicare Other | Admitting: Orthopaedic Surgery

## 2018-03-17 ENCOUNTER — Other Ambulatory Visit: Payer: Self-pay | Admitting: Radiology

## 2018-03-17 NOTE — Telephone Encounter (Signed)
Refill request received via fax for Diclofenac 90 day supply with refill

## 2018-03-21 MED ORDER — DICLOFENAC SODIUM 75 MG PO TBEC: 75.0000 mg | DELAYED_RELEASE_TABLET | Freq: Two times a day (BID) | ORAL | 3 refills | Status: DC

## 2018-04-05 DIAGNOSIS — M1712 Unilateral primary osteoarthritis, left knee: Secondary | ICD-10-CM | POA: Insufficient documentation

## 2018-04-05 DIAGNOSIS — M1711 Unilateral primary osteoarthritis, right knee: Secondary | ICD-10-CM | POA: Insufficient documentation

## 2018-04-11 ENCOUNTER — Ambulatory Visit: Payer: Medicare Other | Admitting: Orthopaedic Surgery

## 2018-04-18 ENCOUNTER — Ambulatory Visit: Payer: Medicare Other | Admitting: Orthopaedic Surgery

## 2018-04-25 ENCOUNTER — Other Ambulatory Visit: Payer: Self-pay

## 2018-04-25 MED ORDER — OXYCODONE-ACETAMINOPHEN 5-325 MG PO TABS: 1.0000 | ORAL_TABLET | Freq: Four times a day (QID) | ORAL | 0 refills | Status: DC | PRN

## 2018-05-09 ENCOUNTER — Ambulatory Visit (HOSPITAL_COMMUNITY): Payer: Medicare Other | Admitting: Psychiatry

## 2018-05-17 ENCOUNTER — Encounter (HOSPITAL_COMMUNITY): Payer: Self-pay | Admitting: Psychiatry

## 2018-05-17 ENCOUNTER — Ambulatory Visit (INDEPENDENT_AMBULATORY_CARE_PROVIDER_SITE_OTHER): Payer: Medicare Other | Admitting: Psychiatry

## 2018-05-17 VITALS — BP 150/90 | HR 89 | Ht 66.0 in | Wt 292.0 lb

## 2018-05-17 DIAGNOSIS — F3162 Bipolar disorder, current episode mixed, moderate: Secondary | ICD-10-CM

## 2018-05-17 MED ORDER — ESCITALOPRAM OXALATE 20 MG PO TABS: 20.0000 mg | ORAL_TABLET | Freq: Every day | ORAL | 2 refills | Status: DC

## 2018-05-17 MED ORDER — ARIPIPRAZOLE 10 MG PO TABS: 10.0000 mg | ORAL_TABLET | Freq: Every day | ORAL | 2 refills | Status: DC

## 2018-05-17 MED ORDER — TEMAZEPAM 30 MG PO CAPS: 30.0000 mg | ORAL_CAPSULE | Freq: Every evening | ORAL | 2 refills | Status: DC | PRN

## 2018-05-17 MED ORDER — ALPRAZOLAM 1 MG PO TABS: 1.0000 mg | ORAL_TABLET | Freq: Three times a day (TID) | ORAL | 1 refills | Status: DC | PRN
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
## 2018-06-06 ENCOUNTER — Ambulatory Visit (INDEPENDENT_AMBULATORY_CARE_PROVIDER_SITE_OTHER): Payer: Medicare Other | Admitting: Orthopaedic Surgery

## 2018-06-06 ENCOUNTER — Encounter: Payer: Self-pay | Admitting: Orthopaedic Surgery

## 2018-06-06 DIAGNOSIS — M25562 Pain in left knee: Secondary | ICD-10-CM | POA: Diagnosis not present

## 2018-06-06 DIAGNOSIS — G8929 Other chronic pain: Secondary | ICD-10-CM

## 2018-06-06 DIAGNOSIS — Z6841 Body Mass Index (BMI) 40.0 and over, adult: Secondary | ICD-10-CM | POA: Diagnosis not present

## 2018-06-06 MED ORDER — OXYCODONE-ACETAMINOPHEN 5-325 MG PO TABS: 1.0000 | ORAL_TABLET | Freq: Four times a day (QID) | ORAL | 0 refills | Status: DC | PRN

## 2018-06-06 NOTE — Progress Notes (Signed)
CC:  I have pain of my left knee. I would like an injection.  The patient has chronic pain of the left knee.  There is no recent trauma.  There is no redness.  Injections in the past have helped.  The knee has no redness, has an effusion and crepitus present.  ROM of the left knee is 0-100.  Impression:  Chronic knee pain left  Return: 1 month  PROCEDURE NOTE:  The patient requests injections of the left knee, verbal consent was obtained.  The left knee was prepped appropriately after time out was performed.   Sterile technique was observed and injection of 1 cc of Depo-Medrol 40 mg with several cc's of plain xylocaine. Anesthesia was provided by ethyl chloride and a 20-gauge needle was used to inject the knee area. The injection was tolerated well.  A band aid dressing was applied.  The patient was advised to apply ice later today and tomorrow to the injection sight as needed.  The patient has read and signed an Opioid Treatment Agreement which has been scanned and added to the medical record.  The patient understands the agreement and agrees to abide with it.  The patient has chronic pain that is being treated with an opioid which relieves the pain.  The patient understands potential complications with chronic opioid treatment.   I have reviewed the [place] web site prior to prescribing narcotic medicine for this patient.   Electronically Signed Sanjuana Kava, MD 1/28/20203:47 PM

## 2018-06-14 DIAGNOSIS — M722 Plantar fascial fibromatosis: Secondary | ICD-10-CM | POA: Insufficient documentation

## 2018-07-05 ENCOUNTER — Encounter: Payer: Self-pay | Admitting: Orthopaedic Surgery

## 2018-07-05 ENCOUNTER — Ambulatory Visit (INDEPENDENT_AMBULATORY_CARE_PROVIDER_SITE_OTHER): Payer: Medicare Other | Admitting: Orthopaedic Surgery

## 2018-07-05 DIAGNOSIS — M25561 Pain in right knee: Secondary | ICD-10-CM | POA: Diagnosis not present

## 2018-07-05 DIAGNOSIS — G8929 Other chronic pain: Secondary | ICD-10-CM | POA: Diagnosis not present

## 2018-07-05 MED ORDER — OXYCODONE-ACETAMINOPHEN 5-325 MG PO TABS: 1.0000 | ORAL_TABLET | Freq: Four times a day (QID) | ORAL | 0 refills | Status: DC | PRN

## 2018-07-05 NOTE — Progress Notes (Signed)
PROCEDURE NOTE:  The patient requests injections of the right knee , verbal consent was obtained.  The right knee was prepped appropriately after time out was performed.   Sterile technique was observed and injection of 1 cc of Depo-Medrol 40 mg with several cc's of plain xylocaine. Anesthesia was provided by ethyl chloride and a 20-gauge needle was used to inject the knee area. The injection was tolerated well.  A band aid dressing was applied.  The patient was advised to apply ice later today and tomorrow to the injection sight as needed.  I have reviewed the [place] web site prior to prescribing narcotic medicine for this patient.   The patient has read and signed an Opioid Treatment Agreement which has been scanned and added to the medical record.  The patient understands the agreement and agrees to abide with it.  The patient has chronic pain that is being treated with an opioid which relieves the pain.  The patient understands potential complications with chronic opioid treatment.   Electronically Signed Sanjuana Kava, MD 2/26/20201:57 PM

## 2018-07-13 ENCOUNTER — Encounter: Payer: Self-pay | Admitting: Obstetrics & Gynecology

## 2018-07-13 ENCOUNTER — Ambulatory Visit (INDEPENDENT_AMBULATORY_CARE_PROVIDER_SITE_OTHER): Payer: Medicare Other | Admitting: Obstetrics & Gynecology

## 2018-07-13 ENCOUNTER — Other Ambulatory Visit: Payer: Self-pay

## 2018-07-13 ENCOUNTER — Other Ambulatory Visit (HOSPITAL_COMMUNITY)
Admission: RE | Admit: 2018-07-13 | Discharge: 2018-07-13 | Disposition: A | Discharge status: Home / Self Care | Payer: Medicare Other | Source: Ambulatory Visit | Attending: Obstetrics & Gynecology | Admitting: Obstetrics & Gynecology

## 2018-07-13 VITALS — BP 127/80 | HR 65 | Ht 66.0 in | Wt 285.0 lb

## 2018-07-13 DIAGNOSIS — Z124 Encounter for screening for malignant neoplasm of cervix: Secondary | ICD-10-CM | POA: Diagnosis present

## 2018-07-13 DIAGNOSIS — Z1212 Encounter for screening for malignant neoplasm of rectum: Secondary | ICD-10-CM | POA: Diagnosis not present

## 2018-07-13 DIAGNOSIS — Z1151 Encounter for screening for human papillomavirus (HPV): Secondary | ICD-10-CM | POA: Diagnosis not present

## 2018-07-13 DIAGNOSIS — Z01419 Encounter for gynecological examination (general) (routine) without abnormal findings: Secondary | ICD-10-CM

## 2018-07-13 NOTE — Progress Notes (Signed)
Subjective:     [name] is a 58 y.o. female here for a routine exam.  No LMP recorded. Patient has had a hysterectomy. G2P2 Birth Control Method:  Hysterectomy, supracervical  Menstrual Calendar(currently): amenorrheic  Current complaints: none.   Current acute medical issues:     Recent Gynecologic History No LMP recorded. Patient has had a hysterectomy. Last Pap: 2019,  normal Last mammogram: 10/19,  normal  Past Medical History:  Diagnosis Date  . Adenomatous polyp 2009  . Anemia 2003 after surgery needed 1 unit blood  . Anxiety   . Arthritis   . Bipolar 1 disorder ([place])   . Constipation   . Depression   . Diabetes mellitus   . Diabetes mellitus, type II ([place])   . Diverticula of colon 2009  . Generalized headaches   . GERD (gastroesophageal reflux disease)   . History of hiatal hernia    small  . Hypertension   . Migraine   . Obesity   . Pelvic floor dysfunction    abnormal anorectal manometry at [facility] in 2009  . Plantar fasciitis both feet  . Psoriasis   . Sleep apnea    cpap setting of 3.5    Past Surgical History:  Procedure Laterality Date  . ABDOMINAL HYSTERECTOMY     compete  . CATARACT EXTRACTION W/PHACO Right 02/15/2017   Procedure: CATARACT EXTRACTION PHACO AND INTRAOCULAR LENS PLACEMENT (IOC);  Surgeon: Rutherford Guys, MD;  Location: [facility];  Service: Ophthalmology;  Laterality: Right;  CDE: 4.18  . CATARACT EXTRACTION W/PHACO Left 03/01/2017   Procedure: CATARACT EXTRACTION PHACO AND INTRAOCULAR LENS PLACEMENT (IOC);  Surgeon: Rutherford Guys, MD;  Location: [facility];  Service: Ophthalmology;  Laterality: Left;  CDE: 2.56  . COLON RESECTION    03/30/2002   with end-colostomy and Hartmann's pouch  . COLON SURGERY     complicated diverticulitis requiring sigmoid resection with colostomy and subsequent takedown  . COLONOSCOPY  12/11/2007   Dr. Gala Romney- marginal prep, normal rectum pancolonic diverticula, adenomatous polyp  . COLONOSCOPY  08/28/2003       Wide open colonic anastomosis/Scattered diverticula noted throughout colon/ Small external hemorrhoids  . COLONOSCOPY  04/2012   [facility]: hyperplastic polyps, diverticulosis, ileocolonic anastomosis.  . COLONOSCOPY WITH PROPOFOL N/A 06/07/2016   Procedure: COLONOSCOPY WITH PROPOFOL;  Surgeon: Daneil Dolin, MD;  Location: [facility];  Service: Endoscopy;  Laterality: N/A;  7:30 am  . COLOSTOMY CLOSURE  07/10/2002  . ESOPHAGOGASTRODUODENOSCOPY (EGD) WITH PROPOFOL N/A 08/23/2016   Procedure: ESOPHAGOGASTRODUODENOSCOPY (EGD) WITH PROPOFOL;  Surgeon: Alphonsa Overall, MD;  Location: [facility];  Service: General;  Laterality: N/A;  . FLEXIBLE SIGMOIDOSCOPY  01/20/2012   [facility]: incomplete/attempted colonoscopy. Inadequate prep precluded examination  . HEEL SPUR SURGERY Left 09/11/2013   both have been done  . KNEE ARTHROSCOPY  02/04/2004    left knee/partial medial meniscectomy.  Marland Kitchen KNEE SURGERY     3 arthroscopic  2 on left 1 on right  . LAPAROSCOPIC GASTRIC BANDING  2009  . LAPAROSCOPIC SALPINGOOPHERECTOMY  03/30/2002  . TUBAL LIGATION      OB History    Gravida  2   Para  2   Term      Preterm      AB      Living  2     SAB      TAB      Ectopic      Multiple      Live Births  Social History   Socioeconomic History  . Marital status: Married    Spouse name: Not on file  . Number of children: 2  . Years of education: college  . Highest education level: Not on file  Occupational History  . 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 Financial resource strain: Not on file  . Food insecurity:    Worry: Not on file    Inability: Not on file  . Transportation needs:    Medical: Not on file    Non-medical: Not on file  Tobacco Use  . Smoking status: Never Smoker  . Smokeless tobacco: Never Used  Substance and Sexual Activity  . Alcohol use: Yes    Comment: occasionally wine  . Drug use: No  .  Sexual activity: Not Currently    Birth control/protection: Surgical  Lifestyle  . Physical activity:    Days per week: Not on file    Minutes per session: Not on file  . Stress: Not on file  Relationships  . Social connections:    Talks on phone: Not on file    Gets together: Not on file    Attends religious service: Not on file    Active member of club or organization: Not on file    Attends meetings of clubs or organizations: Not on file    Relationship status: Not on file  Other Topics Concern  . Not on file  Social History Narrative  . Not on file    Family History  Problem Relation Age of Onset  . Ovarian cancer Mother   . Heart disease Mother   . Anxiety disorder Mother   . Cirrhosis Father        deceased age 64  . Alcohol abuse Father   . Liver cancer Cousin        age 18, deceased  . Drug abuse Cousin   . ADD / ADHD Son   . Anxiety disorder Sister   . Dementia Maternal Grandfather   . Colon cancer Other        aunt, deceased age 71  . Breast cancer Other        aunt, deceased age 79  . Bipolar disorder Neg Hx   . Depression Neg Hx   . OCD Neg Hx   . Paranoid behavior Neg Hx   . Schizophrenia Neg Hx   . Seizures Neg Hx   . Sexual abuse Neg Hx   . Physical abuse Neg Hx      Current Outpatient Medications:  .  ALPRAZolam (XANAX) 1 MG tablet, Take 1 tablet (1 mg total) by mouth 3 (three) times daily as needed for anxiety., Disp: 270 tablet, Rfl: 1 .  ARIPiprazole (ABILIFY) 10 MG tablet, Take 1 tablet (10 mg total) by mouth at bedtime., Disp: 90 tablet, Rfl: 2 .  cycloSPORINE (RESTASIS) 0.05 % ophthalmic emulsion, Place 2 drops into both eyes 2 (two) times daily. , Disp: , Rfl:  .  diclofenac (VOLTAREN) 75 MG EC tablet, Take 1 tablet (75 mg total) by mouth 2 (two) times daily., Disp: 90 tablet, Rfl: 3 .  escitalopram (LEXAPRO) 20 MG tablet, Take 1 tablet (20 mg total) by mouth daily., Disp: 90 tablet, Rfl: 2 .  fluticasone (FLONASE) 50 MCG/ACT nasal spray,  Place 2 sprays into the nose daily as needed for allergies. , Disp: , Rfl:  .  gabapentin (NEURONTIN) 100 MG capsule, TK ONE  C PO QHS, Disp: , Rfl: 11 .  linaclotide (LINZESS) 290 MCG CAPS capsule, Take 1 capsule (290 mcg total) by mouth daily. (Patient taking differently: Take 290 mcg by mouth daily as needed (constipation). ), Disp: 90 capsule, Rfl: 3 .  lisinopril (PRINIVIL,ZESTRIL) 5 MG tablet, , Disp: , Rfl:  .  loratadine (CLARITIN) 10 MG tablet, Take 10 mg by mouth daily., Disp: , Rfl:  .  meclizine (ANTIVERT) 25 MG tablet, Take 25 mg by mouth daily as needed for dizziness. , Disp: , Rfl: 2 .  meloxicam (MOBIC) 15 MG tablet, Take 1 tablet (15 mg total) by mouth daily., Disp: 30 tablet, Rfl: 5 .  metFORMIN (GLUCOPHAGE) 500 MG tablet, Take 500 mg by mouth 2 (two) times daily. , Disp: , Rfl:  .  methocarbamol (ROBAXIN) 500 MG tablet, Take 500 mg by mouth 3 (three) times daily as needed for muscle spasms. , Disp: , Rfl:  .  Multiple Vitamin (MULTIVITAMIN PO), Take 1 tablet by mouth daily. , Disp: , Rfl:  .  ondansetron (ZOFRAN ODT) 4 MG disintegrating tablet, Take 1 tablet (4 mg total) by mouth every 8 (eight) hours as needed for nausea or vomiting., Disp: 10 tablet, Rfl: 0 .  oxyCODONE-acetaminophen (PERCOCET/ROXICET) 5-325 MG tablet, Take 1 tablet by mouth every 6 (six) hours as needed for moderate pain or severe pain (Must last 30 days.)., Disp: 120 tablet, Rfl: 0 .  pantoprazole (PROTONIX) 40 MG tablet, Take 1 tablet (40 mg total) by mouth 2 (two) times daily., Disp: 180 tablet, Rfl: 3 .  polyethylene glycol (MIRALAX / GLYCOLAX) packet, Take 17 g by mouth daily. , Disp: , Rfl:  .  Propylene Glycol-Glycerin (SOOTHE) 0.6-0.6 % SOLN, Place 1 drop into both eyes 2 (two) times daily., Disp: , Rfl:  .  Psyllium (METAMUCIL PO), Take 1 each by mouth 2 (two) times daily. , Disp: , Rfl:  .  rizatriptan (MAXALT-MLT) 10 MG disintegrating tablet, Take 10 mg by mouth as needed for migraine. May repeat in 2  hours if needed, Disp: , Rfl:  .  temazepam (RESTORIL) 30 MG capsule, Take 1 capsule (30 mg total) by mouth at bedtime as needed for sleep., Disp: 90 capsule, Rfl: 2  Review of Systems  Review of Systems  Constitutional: Negative for fever, chills, weight loss, malaise/fatigue and diaphoresis.  HENT: Negative for hearing loss, ear pain, nosebleeds, congestion, sore throat, neck pain, tinnitus and ear discharge.   Eyes: Negative for blurred vision, double vision, photophobia, pain, discharge and redness.  Respiratory: Negative for cough, hemoptysis, sputum production, shortness of breath, wheezing and stridor.   Cardiovascular: Negative for chest pain, palpitations, orthopnea, claudication, leg swelling and PND.  Gastrointestinal: negative for abdominal pain. Negative for heartburn, nausea, vomiting, diarrhea, constipation, blood in stool and melena.  Genitourinary: Negative for dysuria, urgency, frequency, hematuria and flank pain.  Musculoskeletal: Negative for myalgias, back pain, joint pain and falls.  Skin: Negative for itching and rash.  Neurological: Negative for dizziness, tingling, tremors, sensory change, speech change, focal weakness, seizures, loss of consciousness, weakness and headaches.  Endo/Heme/Allergies: Negative for environmental allergies and polydipsia. Does not bruise/bleed easily.  Psychiatric/Behavioral: Negative for depression, suicidal ideas, hallucinations, memory loss and substance abuse. The patient is not nervous/anxious and does not have insomnia.        Objective:  Blood pressure 127/80, pulse 65, height 5\' 6"  (1.676 m), weight 285 lb (129.3 kg).   Physical Exam  Vitals reviewed. Constitutional: She is oriented to person,  place, and time. She appears well-developed and well-nourished.  HENT:  Head: Normocephalic and atraumatic.        Right Ear: External ear normal.  Left Ear: External ear normal.  Nose: Nose normal.  Mouth/Throat: Oropharynx is clear  and moist.  Eyes: Conjunctivae and EOM are normal. Pupils are equal, round, and reactive to light. Right eye exhibits no discharge. Left eye exhibits no discharge. No scleral icterus.  Neck: Normal range of motion. Neck supple. No tracheal deviation present. No thyromegaly present.  Cardiovascular: Normal rate, regular rhythm, normal heart sounds and intact distal pulses.  Exam reveals no gallop and no friction rub.   No murmur heard. Respiratory: Effort normal and breath sounds normal. No respiratory distress. She has no wheezes. She has no rales. She exhibits no tenderness.  GI: Soft. Bowel sounds are normal. She exhibits no distension and no mass. There is no tenderness. There is no rebound and no guarding.  Genitourinary:  Breasts no masses skin changes or nipple changes bilaterally      Vulva is normal without lesions Vagina is pink moist without discharge Cervix normal in appearance and pap is done Uterus is normal size shape and contour Adnexa is negative with normal sized ovaries  {Rectal    hemoccult negative, normal tone, no masses  Musculoskeletal: Normal range of motion. She exhibits no edema and no tenderness.  Neurological: She is alert and oriented to person, place, and time. She has normal reflexes. She displays normal reflexes. No cranial nerve deficit. She exhibits normal muscle tone. Coordination normal.  Skin: Skin is warm and dry. No rash noted. No erythema. No pallor.  Psychiatric: She has a normal mood and affect. Her behavior is normal. Judgment and thought content normal.       Medications Ordered at today's visit: No orders of the defined types were placed in this encounter.   Other orders placed at today's visit: No orders of the defined types were placed in this encounter.     Assessment:    Healthy female exam.    Plan:    Mammogram ordered. Follow up in: 1 year.     Return in about 1 year (around 07/13/2019) for yearly.

## 2018-07-20 LAB — CYTOLOGY - PAP
DIAGNOSIS: NEGATIVE
HPV (WINDOPATH): NOT DETECTED

## 2018-08-08 ENCOUNTER — Ambulatory Visit: Payer: Medicare Other | Admitting: Orthopaedic Surgery

## 2018-08-16 ENCOUNTER — Other Ambulatory Visit: Payer: Self-pay

## 2018-08-16 ENCOUNTER — Encounter (HOSPITAL_COMMUNITY): Payer: Self-pay | Admitting: Psychiatry

## 2018-08-16 ENCOUNTER — Ambulatory Visit (INDEPENDENT_AMBULATORY_CARE_PROVIDER_SITE_OTHER): Payer: Medicare Other | Admitting: Psychiatry

## 2018-08-16 DIAGNOSIS — F3162 Bipolar disorder, current episode mixed, moderate: Secondary | ICD-10-CM

## 2018-08-16 MED ORDER — ARIPIPRAZOLE 10 MG PO TABS: 10.0000 mg | ORAL_TABLET | Freq: Every day | ORAL | 2 refills | Status: DC

## 2018-08-16 MED ORDER — TEMAZEPAM 30 MG PO CAPS: 30.0000 mg | ORAL_CAPSULE | Freq: Every evening | ORAL | 2 refills | Status: DC | PRN

## 2018-08-16 MED ORDER — ALPRAZOLAM 1 MG PO TABS: 1.0000 mg | ORAL_TABLET | Freq: Three times a day (TID) | ORAL | 1 refills | Status: DC | PRN

## 2018-08-16 MED ORDER — ESCITALOPRAM OXALATE 20 MG PO TABS: 20.0000 mg | ORAL_TABLET | Freq: Every day | ORAL | 2 refills | Status: DC
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
## 2018-09-04 ENCOUNTER — Telehealth: Payer: Self-pay | Admitting: Obstetrics & Gynecology

## 2018-09-04 NOTE — Telephone Encounter (Signed)
Patient called, has a question about her last diagnosis.  [phone]

## 2018-09-04 NOTE — Telephone Encounter (Signed)
Patient states she has been reviewing her records and has noticed HPV diagnosis is her chart.  She is concerned and wondering when the last diagnosis of HPV was.  I informed her that since 2012, all her paps have been normal.  Discussed HPV with patient and all questions answered. Pt verbalized gratitude.

## 2018-09-05 ENCOUNTER — Other Ambulatory Visit: Payer: Self-pay

## 2018-09-05 ENCOUNTER — Ambulatory Visit (INDEPENDENT_AMBULATORY_CARE_PROVIDER_SITE_OTHER): Payer: Medicare Other | Admitting: Orthopaedic Surgery

## 2018-09-05 ENCOUNTER — Encounter: Payer: Self-pay | Admitting: Orthopaedic Surgery

## 2018-09-05 VITALS — Temp 98.5°F | Ht 66.0 in | Wt 285.0 lb

## 2018-09-05 DIAGNOSIS — M25562 Pain in left knee: Secondary | ICD-10-CM | POA: Diagnosis not present

## 2018-09-05 DIAGNOSIS — G8929 Other chronic pain: Secondary | ICD-10-CM | POA: Diagnosis not present

## 2018-09-05 DIAGNOSIS — M25561 Pain in right knee: Secondary | ICD-10-CM

## 2018-09-05 MED ORDER — DICLOFENAC SODIUM 75 MG PO TBEC: 75.0000 mg | DELAYED_RELEASE_TABLET | Freq: Two times a day (BID) | ORAL | 3 refills | Status: DC

## 2018-09-05 MED ORDER — OXYCODONE-ACETAMINOPHEN 5-325 MG PO TABS: 1.0000 | ORAL_TABLET | Freq: Four times a day (QID) | ORAL | 0 refills | Status: DC | PRN

## 2018-09-05 NOTE — Progress Notes (Signed)
CC: Both of my knees are hurting. I would like an injection in both knees.  The patient has had chronic pain and tenderness of both knees for some time.  Injections help.  There is no locking or giving way of the knee.  There is no new trauma. There is no redness or signs of infections.  The knees have a mild effusion and some crepitus.  There is no redness or signs of recent trauma.  Right knee ROM is 0-100 and left knee ROM is 0-105.  Impression:  Chronic pain of the both knees  Return:  as needed  PROCEDURE NOTE:  The patient requests injections of both knees, verbal consent was obtained.  The left and right knee were individually prepped appropriately after time out was performed.   Sterile technique was observed and injection of 1 cc of Depo-Medrol 40 mg with several cc's of plain xylocaine. Anesthesia was provided by ethyl chloride and a 20-gauge needle was used to inject each knee area. The injections were tolerated well.  A band aid dressing was applied.  The patient was advised to apply ice later today and tomorrow to the injection sight as needed.   The patient has read and signed an Opioid Treatment Agreement which has been scanned and added to the medical record.  The patient understands the agreement and agrees to abide with it.  The patient has chronic pain that is being treated with an opioid which relieves the pain.  The patient understands potential complications with chronic opioid treatment.   I have reviewed the [facility] web site prior to prescribing narcotic medicine for this patient.   Call if any problem.  Precautions discussed.   Electronically Signed Sanjuana Kava, MD 4/28/20209:46 AM

## 2018-10-24 IMAGING — CT CT ABD-PELV W/ CM
2 of 5 series · 16 of 46 positions shown, 18 images · IV contrast (APPLIED)
Comparison: 05/13/2008

CLINICAL DATA: Left lower quadrant pain x3 weeks, history of
diverticulitis status post colon resection

EXAM:
CT ABDOMEN AND PELVIS WITH CONTRAST
TECHNIQUE: Multidetector CT imaging of the abdomen and pelvis was performed
using the standard protocol following bolus administration of
intravenous contrast.
CONTRAST:  100mL [id number] IOPAMIDOL (E5L18P-TJJ) INJECTION 61%

[Series 2: axial st · axial · 0.92mm/px · z∈[-799,-344]mm · 13 of 105 slices shown, 15 images]
[im 7/105  soft-tissue]
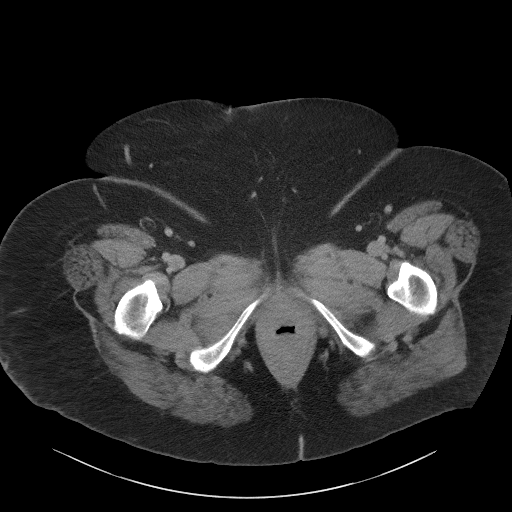
[im 7/105  bone]
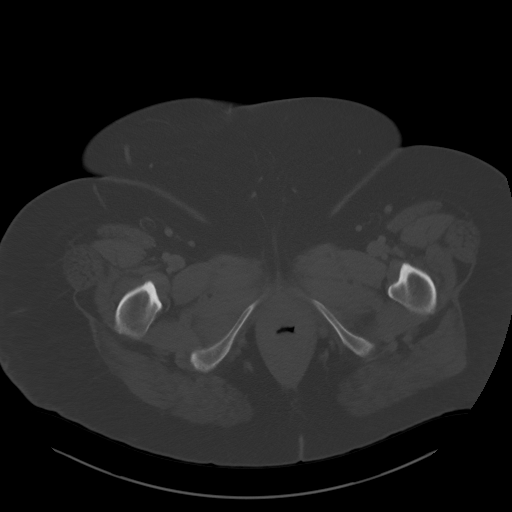
[im 13/105  soft-tissue]
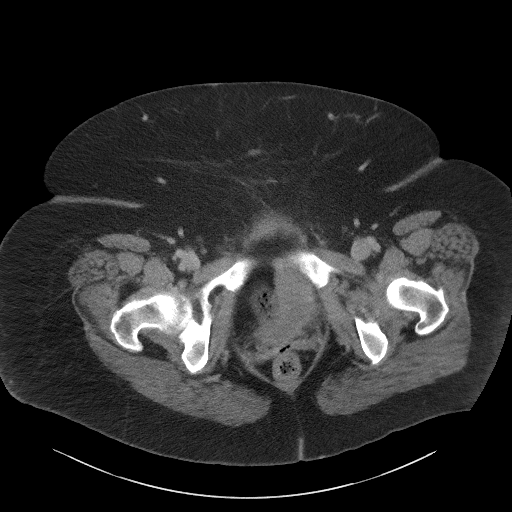
[im 25/105  soft-tissue]
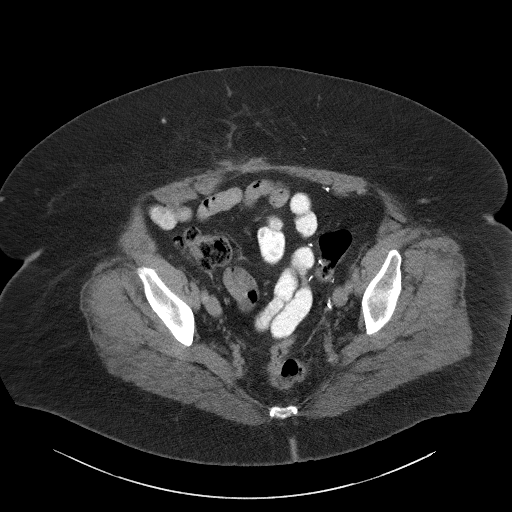
[im 31/105  soft-tissue]
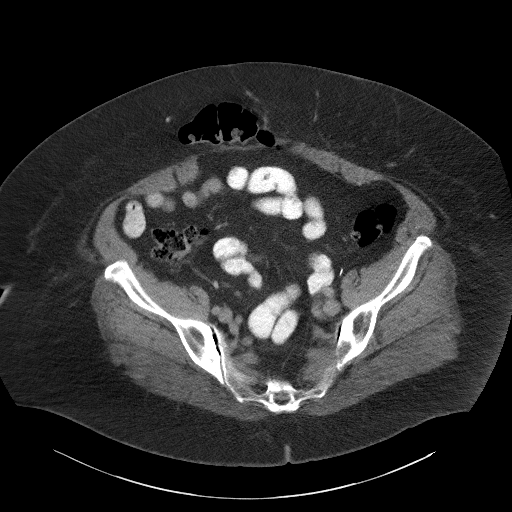
[im 37/105  soft-tissue]
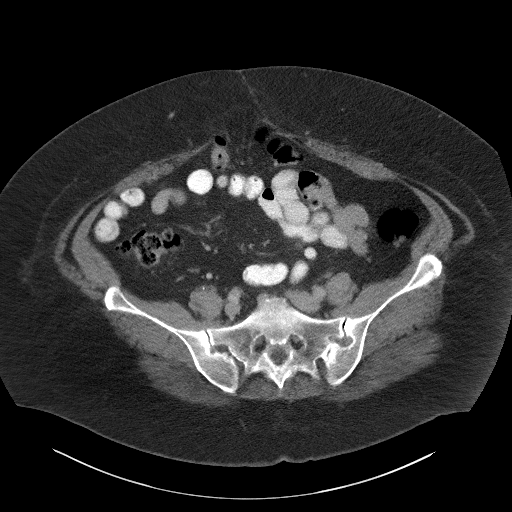
[im 43/105  soft-tissue]
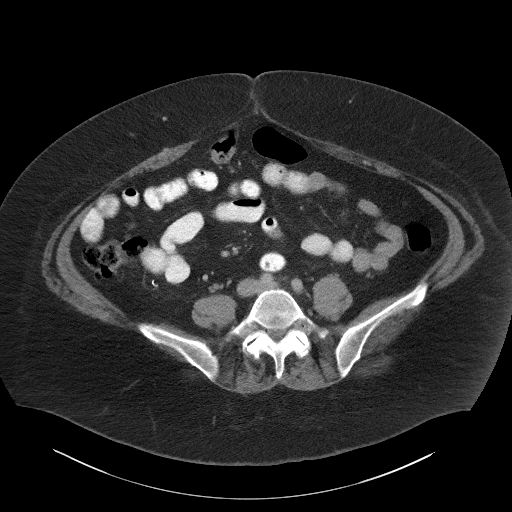
[im 56/105  soft-tissue]
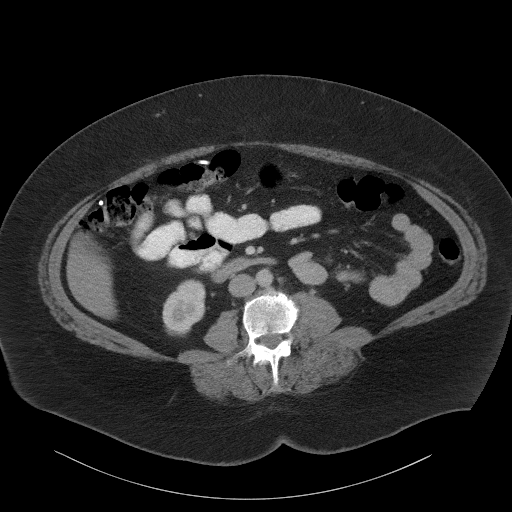
[im 62/105  soft-tissue]
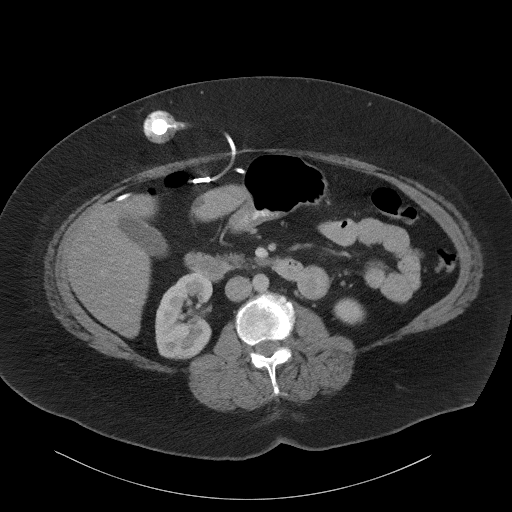
[im 68/105  soft-tissue]
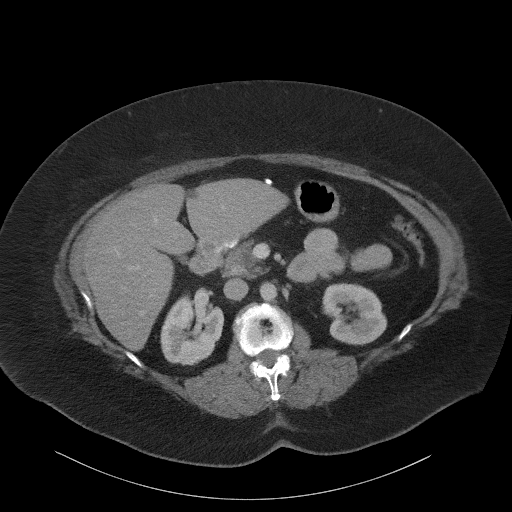
[im 68/105  bone]
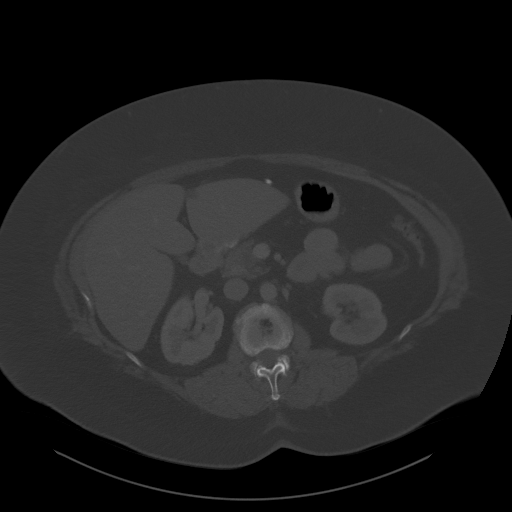
[im 74/105  soft-tissue]
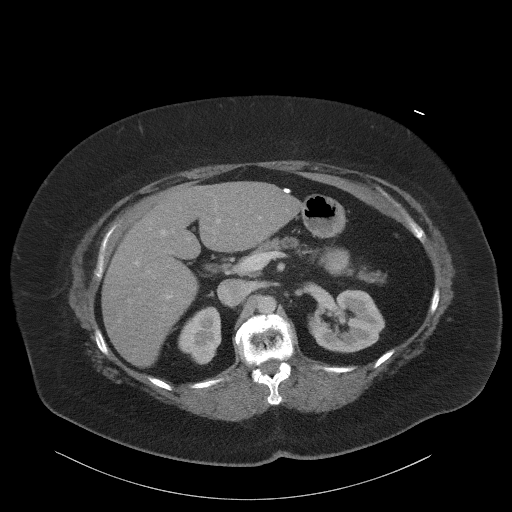
[im 80/105  soft-tissue]
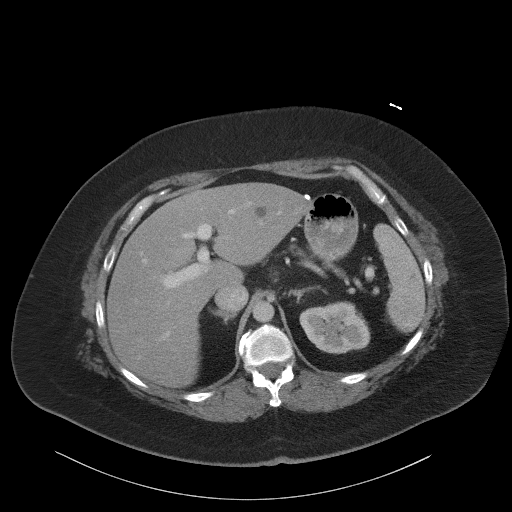
[im 92/105  soft-tissue]
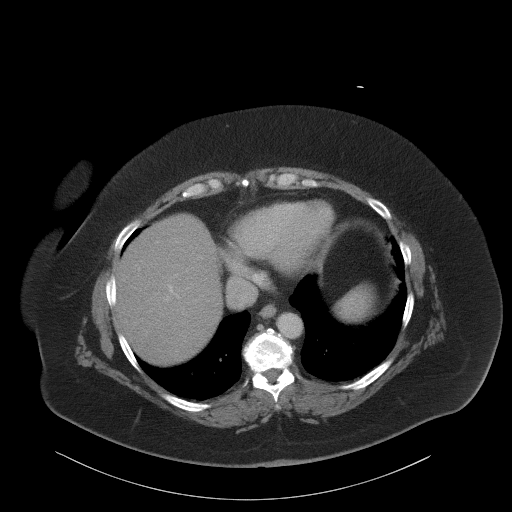
[im 98/105  soft-tissue]
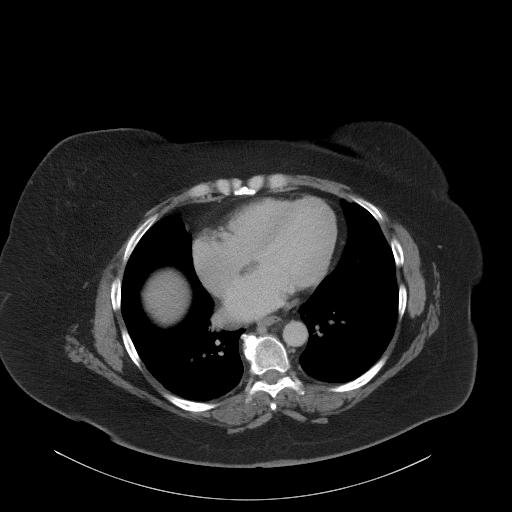

[Series 5: coronal st · coronal · 0.97mm/px · 3 of 105 slices shown]
[im 35/105  soft-tissue]
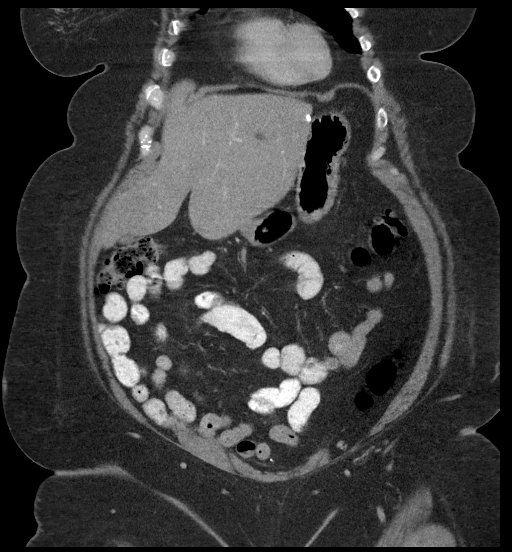
[im 47/105  soft-tissue]
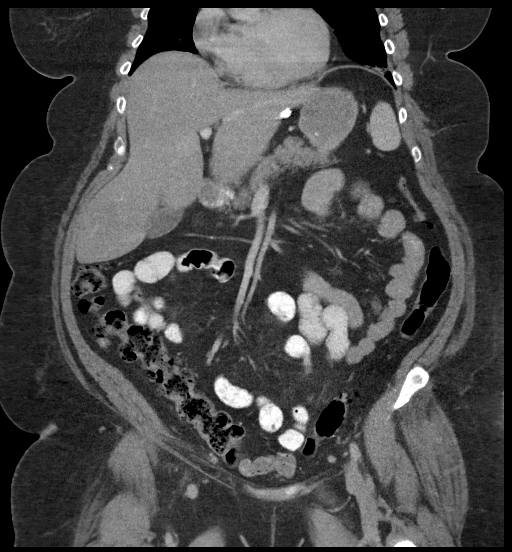
[im 58/105  soft-tissue]
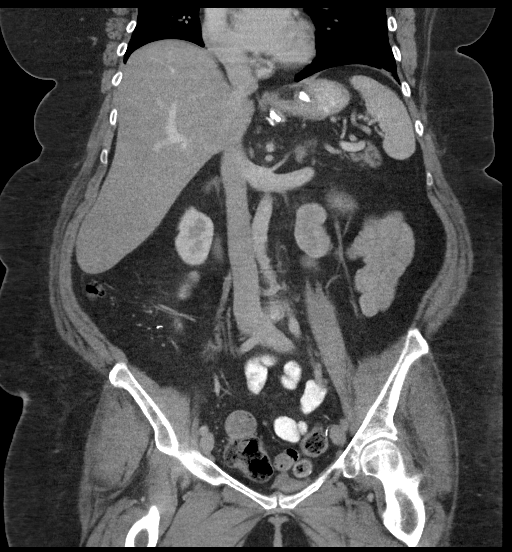

[16 of 46 positions shown; findings below may reference images not displayed]

FINDINGS: Lower chest: Lung bases are clear.

Hepatobiliary: Three probable hepatic cysts measuring up to 13 mm in
the medial segment left hepatic lobe (series 2/ image 37).

Gallbladder is within normal limits. No intrahepatic extrahepatic
ductal dilatation.

Pancreas: Within normal limits.

Spleen: Within normal limits.

Adrenals/Urinary Tract: Adrenal glands are within normal limits.

1.3 cm left upper pole renal cyst (series 3/image 13). Right kidney
is within normal limits. No hydronephrosis.

Bladder is underdistended but unremarkable.

Stomach/Bowel: Stomach is notable for postsurgical changes related
to laparoscopic band, with satisfactory orientation.

No evidence of bowel obstruction.

Appendix is not discretely visualized.

Postsurgical change related to prior restriction.

Sigmoid diverticulosis, without evidence of diverticulitis.

Vascular/Lymphatic: No evidence of abdominal aortic aneurysm.

No suspicious abdominopelvic lymphadenopathy.

Reproductive: Status post supracervical hysterectomy.

No adnexal masses.

Other: No abdominopelvic ascites.

Right paramidline lower abdominal ventral hernia containing fat and
a loop of transverse colon (series 2/image 72).

Musculoskeletal: Degenerative changes of the visualized
thoracolumbar spine.
IMPRESSION: Status post partial left hemicolectomy.

No evidence of bowel obstruction. Mild sigmoid diverticulosis,
without evidence of diverticulitis.

Postsurgical change related to laparoscopic band.

Additional ancillary findings as above.

## 2018-10-31 MED ORDER — OXYCODONE-ACETAMINOPHEN 5-325 MG PO TABS: 1.0000 | ORAL_TABLET | Freq: Four times a day (QID) | ORAL | 0 refills | Status: DC | PRN

## 2018-11-16 ENCOUNTER — Ambulatory Visit (INDEPENDENT_AMBULATORY_CARE_PROVIDER_SITE_OTHER): Payer: Medicare Other | Admitting: Orthopaedic Surgery

## 2018-11-16 ENCOUNTER — Encounter: Payer: Self-pay | Admitting: Orthopaedic Surgery

## 2018-11-16 ENCOUNTER — Other Ambulatory Visit: Payer: Self-pay

## 2018-11-16 DIAGNOSIS — M25561 Pain in right knee: Secondary | ICD-10-CM

## 2018-11-16 DIAGNOSIS — M25562 Pain in left knee: Secondary | ICD-10-CM

## 2018-11-16 DIAGNOSIS — G8929 Other chronic pain: Secondary | ICD-10-CM

## 2018-11-16 DIAGNOSIS — Z6841 Body Mass Index (BMI) 40.0 and over, adult: Secondary | ICD-10-CM | POA: Diagnosis not present

## 2018-11-16 NOTE — Progress Notes (Signed)
CC: Both of my knees are hurting. I would like an injection in both knees.  The patient has had chronic pain and tenderness of both knees for some time.  Injections help.  There is no locking or giving way of the knee.  There is no new trauma. There is no redness or signs of infections.  The knees have a mild effusion and some crepitus.  There is no redness or signs of recent trauma.  Right knee ROM is 0-105 and left knee ROM is 0-110.  Impression:  Chronic pain of the both knees  Return:  as needed  PROCEDURE NOTE:  The patient requests injections of both knees, verbal consent was obtained.  The left and right knee were individually prepped appropriately after time out was performed.   Sterile technique was observed and injection of 1 cc of Depo-Medrol 40 mg with several cc's of plain xylocaine. Anesthesia was provided by ethyl chloride and a 20-gauge needle was used to inject each knee area. The injections were tolerated well.  A band aid dressing was applied.  The patient was advised to apply ice later today and tomorrow to the injection sight as needed.   Electronically Signed Sanjuana Kava, MD 7/9/202011:11 AM

## 2018-11-21 ENCOUNTER — Other Ambulatory Visit: Payer: Self-pay

## 2018-11-21 ENCOUNTER — Ambulatory Visit (INDEPENDENT_AMBULATORY_CARE_PROVIDER_SITE_OTHER): Payer: Medicare Other | Admitting: Psychiatry

## 2018-11-21 ENCOUNTER — Encounter (HOSPITAL_COMMUNITY): Payer: Self-pay | Admitting: Psychiatry

## 2018-11-21 DIAGNOSIS — F3162 Bipolar disorder, current episode mixed, moderate: Secondary | ICD-10-CM

## 2018-11-21 MED ORDER — ESCITALOPRAM OXALATE 20 MG PO TABS: 20.0000 mg | ORAL_TABLET | Freq: Every day | ORAL | 2 refills | Status: DC

## 2018-11-21 MED ORDER — ALPRAZOLAM 1 MG PO TABS: 1.0000 mg | ORAL_TABLET | Freq: Three times a day (TID) | ORAL | 1 refills | Status: DC | PRN

## 2018-11-21 MED ORDER — TEMAZEPAM 30 MG PO CAPS: 30.0000 mg | ORAL_CAPSULE | Freq: Every evening | ORAL | 2 refills | Status: DC | PRN

## 2018-11-21 MED ORDER — ARIPIPRAZOLE 10 MG PO TABS: 10.0000 mg | ORAL_TABLET | Freq: Every day | ORAL | 2 refills | Status: DC
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
## 2018-11-23 ENCOUNTER — Telehealth: Payer: Self-pay

## 2018-11-23 NOTE — Telephone Encounter (Signed)
I called pt to remind her to bring her cpap to the appt on Monday. No answer, left a message asking her to call me back. If pt calls back, please remind her of this.

## 2018-11-26 ENCOUNTER — Encounter: Payer: Self-pay | Admitting: Adult Health

## 2018-11-27 ENCOUNTER — Encounter: Payer: Self-pay | Admitting: Neurology

## 2018-11-27 ENCOUNTER — Ambulatory Visit: Payer: Medicare Other | Admitting: Nurse Practitioner

## 2018-11-27 ENCOUNTER — Ambulatory Visit (INDEPENDENT_AMBULATORY_CARE_PROVIDER_SITE_OTHER): Payer: Medicare Other | Admitting: Neurology

## 2018-11-27 ENCOUNTER — Other Ambulatory Visit: Payer: Self-pay

## 2018-11-27 VITALS — BP 183/105 | HR 62 | Ht 66.0 in | Wt 296.0 lb

## 2018-11-27 DIAGNOSIS — Z9989 Dependence on other enabling machines and devices: Secondary | ICD-10-CM

## 2018-11-27 DIAGNOSIS — G4733 Obstructive sleep apnea (adult) (pediatric): Secondary | ICD-10-CM | POA: Diagnosis not present

## 2018-11-27 NOTE — Patient Instructions (Signed)
As discussed, we will set you up with a new CPAP machine. We will send the order to your DME company and they will be in touch with you about getting your machine and supplies. Feel free to ask any questions you may have. You will need a follow up appointment in about 3 months, you can see one of our Nurse Practitioners.  Certain insurances are very strict about "compliance", meaning, you may lose coverage of the machine and supplies, if you don't fulfill compliance criteria which typically means: using your machine for more than 4 hours each night (sleep period) 70% of the time. From my end of things, I would recommend that you use your machine every night all night, if possible. That way, you may get used to using the machine easier and may benefit from it better.  Please remember, the long-term risks and ramifications of untreated moderate to severe obstructive sleep apnea are: increased Cardiovascular disease, including congestive heart failure, stroke, difficult to control hypertension, treatment resistant obesity, arrhythmias, especially irregular heartbeat commonly known as A. Fib. (atrial fibrillation); even type 2 diabetes has been linked to untreated OSA.   Sleep apnea can cause disruption of sleep and sleep deprivation in most cases, which, in turn, can cause recurrent headaches, problems with memory, mood, concentration, focus, and vigilance. Most people with untreated sleep apnea report excessive daytime sleepiness, which can affect their ability to drive. Please do not drive if you feel sleepy. Patients with sleep apnea developed difficulty initiating and maintaining sleep (aka insomnia).  Patients with mild sleep apnea can benefit from treatment with AutoPap or CPAP, because we see symptom improvement such as improvement in headaches, nighttime bathroom breaks, improvement in daytime sleepiness or sluggishness, improvement in mental sharpness, improvement of underlying mood disorder such as  anxiety and depression etc.

## 2018-11-27 NOTE — Progress Notes (Signed)
Subjective:    Patient ID: [name] is a 58 y.o. female.  HPI     Interim history:   [name] is a 58 year old right-handed woman with a complex medical history of bipolar disorder, migraine headaches, obesity, s/p lap band surgery, memory problems, colonic polyps, diabetes, anxiety, reflux disease, constipation and osteoarthritis, who presents for followup consultation of her obstructive sleep apnea, on CPAP therapy. She is unaccompanied today and presents for her yearly checkup. I last saw her on 11/24/2017, at which time she was compliant with her CPAP.  She had some residual daytime somnolence but was also taking quite a few potentially sedating medications including temazepam, Xanax, Abilify and oxycodone.  She was advised to continue with CPAP therapy and follow-up routinely in 1 year.   Today, 11/27/2018 (all dictated new, as well as above notes, some dictation done in note pad or Word, outside of chart, may appear as copied):   I reviewed her CPAP compliance data from 10/28/2018 through 11/26/2018 which is a total of 30 days, during which time she used her machine 25 days with percent used days greater than 4 hours at 77%, indicating adequate compliance with an average usage of 6 hours and 42 minutes, residual AHI at goal at 1.7/h, leak on the high side with a 95th percentile at 20.5 L/min on a pressure of 10 cm with EPR of 3.  Her set up date was 06/08/2013. She reports Doing well with her CPAP machine.  Her weight has been fluctuating, she did not take her machine with her recently when she spent about a week at her sister's house.  Otherwise, she is fully compliant with treatment.  She uses nasal pillows and is eligible for a new machine and also needs new supplies.  She is motivated to continue with treatment.  She has knee pain bilaterally and sees orthopedics for this.  She is looking into establishing care with a different orthopedic surgeon as her own doctor is about to retire.     The patient's allergies, current medications, family history, past medical history, past social history, past surgical history and problem list were reviewed and updated as appropriate.    Previously (copied from previous notes for reference):    I saw her on 01/30/2015, at which time she reported doing well with CPAP. She was compliant with treatment and I suggested a one-year checkup.   I reviewed her CPAP compliance data from 10/25/2017 through 11/23/2017 which is a total of 30 days, during which time she used her CPAP 27 days with percent used days greater than 4 hours at 83%, indicating very good compliance with an average usage of 5 hours and 38 minutes, residual AHI at goal at 0.8 per hour, leak on the high side with the 95th percentile at 30 L/m on a pressure of 10 cm with EPR of 3.   I saw her on 01/25/2014, at which time she reported ongoing good results with CPAP. She was compliant with treatment. She had recent left plantar fasciitis surgery. She was complaining of bilateral knee pain.   I reviewed her CPAP compliance data from 12/29/2014 through 01/27/2015 which is a total of 30 days during which time she used her machine every night with percent used days greater than 4 hours at 100%, indicating superb compliance with an average usage of 7 hours and 58 minutes, residual AHI low at 1 per hour, leak low for the 95th percentile at 5.7 L/m on a pressure of 10  cm with EPR of 3.     I first met her at the request of Dr. Jannifer Franklin on 07/09/2013, at which time we talked about her sleep studies including her baseline and her CPAP titration study as well as her CPAP compliance data. She reported having been able to dream since being on CPAP therapy and she had less daytime somnolence. She had been able to sleep in her bed as opposed to the recliner and felt that her sleep quality was much better. She had reduced nocturia and also reduced RLS symptoms.   I reviewed her compliance data from  10/22/2013 2 11/20/2013 which is a total of 30 days during which time she was CPAP every night with percent used days greater than 4 hours at 90%, average usage of 6 hours and 45 minutes, residual AHI low at 0.9 per hour and leak low at 3.2 L per minute at the 95th percentile, pressure at 10 cm with EPR.    I reviewed her compliance data from 12/24/2013 to 01/22/2014 which is a total of 30 days during which time she used her CPAP every night, percent used days greater than 4 hours was 97%, indicating excellent compliance, average usage of 6 hours and 50 minutes. Residual AHI low at 1.2 per hour, leak low at 4.5 L per minute at the 95th percentile. Pressure at 10 cm with EPR of 3.   She had a baseline sleep study on 04/09/2013 which demonstrated a total AHI of 14.5 per hour, supine AHI of 22.6 per hour, and no REM sleep related AHI of 47.4 per hour with an oxyhemoglobin desaturation nadir of 74%. She had a CPAP titration study on 05/21/2013, which a sleep efficiency of 82.8%. Arousal index was normal. She had an increased percentage of stage II sleep, absence of slow-wave sleep, and an increased percentage of REM sleep at 31.8% with a mildly prolonged REM latency of 134.5 minutes. She had mild periodic leg movements with very little arousals. She was titrated on CPAP using a nasal pillows mask. On a pressure of 10 cm her residual AHI was 5.2 per hour. I prescribed CPAP therapy for her.   I reviewed her compliance data from 06/08/2013 through 07/08/2013 which is a total of 31 days during which time she used CPAP every night. Percent used days greater than 4 hours was 94%, indicating excellent compliance. Residual AHI was 1.5 per hour and leak was very low. Pressure is 10 cm with CPR of 3. Average usage for all nights for 6 hours and 35 minutes.  Her Past Medical History Is Significant For: Past Medical History:  Diagnosis Date  . Adenomatous polyp 2009  . Anemia 2003 after surgery needed 1 unit blood  .  Anxiety   . Arthritis   . Bipolar 1 disorder ([place])   . Constipation   . Depression   . Diabetes mellitus   . Diabetes mellitus, type II ([place])   . Diverticula of colon 2009  . Generalized headaches   . GERD (gastroesophageal reflux disease)   . History of hiatal hernia    small  . Hypertension   . Migraine   . Obesity   . Pelvic floor dysfunction    abnormal anorectal manometry at [facility] in 2009  . Plantar fasciitis both feet  . Psoriasis   . Sleep apnea    cpap setting of 3.5    Her Past Surgical History Is Significant For: Past Surgical History:  Procedure Laterality Date  . ABDOMINAL HYSTERECTOMY  compete  . CATARACT EXTRACTION W/PHACO Right 02/15/2017   Procedure: CATARACT EXTRACTION PHACO AND INTRAOCULAR LENS PLACEMENT (IOC);  Surgeon: Rutherford Guys, MD;  Location: [facility];  Service: Ophthalmology;  Laterality: Right;  CDE: 4.18  . CATARACT EXTRACTION W/PHACO Left 03/01/2017   Procedure: CATARACT EXTRACTION PHACO AND INTRAOCULAR LENS PLACEMENT (IOC);  Surgeon: Rutherford Guys, MD;  Location: [facility];  Service: Ophthalmology;  Laterality: Left;  CDE: 2.56  . COLON RESECTION    03/30/2002   with end-colostomy and Hartmann's pouch  . COLON SURGERY     complicated diverticulitis requiring sigmoid resection with colostomy and subsequent takedown  . COLONOSCOPY  12/11/2007   Dr. Gala Romney- marginal prep, normal rectum pancolonic diverticula, adenomatous polyp  . COLONOSCOPY  08/28/2003      Wide open colonic anastomosis/Scattered diverticula noted throughout colon/ Small external hemorrhoids  . COLONOSCOPY  04/2012   [facility]: hyperplastic polyps, diverticulosis, ileocolonic anastomosis.  . COLONOSCOPY WITH PROPOFOL N/A 06/07/2016   Procedure: COLONOSCOPY WITH PROPOFOL;  Surgeon: Daneil Dolin, MD;  Location: [facility];  Service: Endoscopy;  Laterality: N/A;  7:30 am  . COLOSTOMY CLOSURE  07/10/2002  . ESOPHAGOGASTRODUODENOSCOPY (EGD) WITH PROPOFOL N/A 08/23/2016   Procedure:  ESOPHAGOGASTRODUODENOSCOPY (EGD) WITH PROPOFOL;  Surgeon: Alphonsa Overall, MD;  Location: [facility];  Service: General;  Laterality: N/A;  . FLEXIBLE SIGMOIDOSCOPY  01/20/2012   [facility]: incomplete/attempted colonoscopy. Inadequate prep precluded examination  . HEEL SPUR SURGERY Left 09/11/2013   both have been done  . KNEE ARTHROSCOPY  02/04/2004    left knee/partial medial meniscectomy.  Marland Kitchen KNEE SURGERY     3 arthroscopic  2 on left 1 on right  . LAPAROSCOPIC GASTRIC BANDING  2009  . LAPAROSCOPIC SALPINGOOPHERECTOMY  03/30/2002  . TUBAL LIGATION      Her Family History Is Significant For: Family History  Problem Relation Age of Onset  . Ovarian cancer Mother   . Heart disease Mother   . Anxiety disorder Mother   . Cirrhosis Father        deceased age 16  . Alcohol abuse Father   . Liver cancer Cousin        age 49, deceased  . Drug abuse Cousin   . ADD / ADHD Son   . Anxiety disorder Sister   . Dementia Maternal Grandfather   . Colon cancer Other        aunt, deceased age 52  . Breast cancer Other        aunt, deceased age 64  . Bipolar disorder Neg Hx   . Depression Neg Hx   . OCD Neg Hx   . Paranoid behavior Neg Hx   . Schizophrenia Neg Hx   . Seizures Neg Hx   . Sexual abuse Neg Hx   . Physical abuse Neg Hx     Her Social History Is Significant For: Social History   Socioeconomic History  . Marital status: Married    Spouse name: Not on file  . Number of children: 2  . Years of education: college  . Highest education level: Not on file  Occupational History  . 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 Financial resource strain: Not on file  . Food insecurity    Worry: Not on file    Inability: Not on file  . Transportation needs    Medical: Not on file    Non-medical: Not on file  Tobacco Use  .  Smoking status: Never Smoker  . Smokeless tobacco: Never Used  Substance and Sexual Activity  . Alcohol  use: Yes    Comment: occasionally wine  . Drug use: No  . Sexual activity: Not Currently    Birth control/protection: Surgical  Lifestyle  . Physical activity    Days per week: Not on file    Minutes per session: Not on file  . Stress: Not on file  Relationships  . Social Herbalist on phone: Not on file    Gets together: Not on file    Attends religious service: Not on file    Active member of club or organization: Not on file    Attends meetings of clubs or organizations: Not on file    Relationship status: Not on file  Other Topics Concern  . Not on file  Social History Narrative  . Not on file    Her Allergies Are:  Allergies  Allergen Reactions  . Bactrim Itching, Nausea And Vomiting and Other (See Comments)    Redness  . Neurontin [Gabapentin] Palpitations and Other (See Comments)    confused  :   Her Current Medications Are:  Outpatient Encounter Medications as of 11/27/2018  Medication Sig  . ALPRAZolam (XANAX) 1 MG tablet Take 1 tablet (1 mg total) by mouth 3 (three) times daily as needed for anxiety.  . ARIPiprazole (ABILIFY) 10 MG tablet Take 1 tablet (10 mg total) by mouth at bedtime.  . cycloSPORINE (RESTASIS) 0.05 % ophthalmic emulsion Place 2 drops into both eyes 2 (two) times daily.   . diclofenac (VOLTAREN) 75 MG EC tablet Take 1 tablet (75 mg total) by mouth 2 (two) times daily.  Marland Kitchen escitalopram (LEXAPRO) 20 MG tablet Take 1 tablet (20 mg total) by mouth daily.  . fluticasone (FLONASE) 50 MCG/ACT nasal spray Place 2 sprays into the nose daily as needed for allergies.   Marland Kitchen gabapentin (NEURONTIN) 100 MG capsule TK ONE C PO QHS  . linaclotide (LINZESS) 290 MCG CAPS capsule Take 1 capsule (290 mcg total) by mouth daily. (Patient taking differently: Take 290 mcg by mouth daily as needed (constipation). )  . lisinopril (PRINIVIL,ZESTRIL) 5 MG tablet   . loratadine (CLARITIN) 10 MG tablet Take 10 mg by mouth daily.  . meclizine (ANTIVERT) 25 MG tablet  Take 25 mg by mouth daily as needed for dizziness.   . meloxicam (MOBIC) 15 MG tablet Take 1 tablet (15 mg total) by mouth daily.  . metFORMIN (GLUCOPHAGE) 500 MG tablet Take 500 mg by mouth 2 (two) times daily.   . methocarbamol (ROBAXIN) 500 MG tablet Take 500 mg by mouth 3 (three) times daily as needed for muscle spasms.   . Multiple Vitamin (MULTIVITAMIN PO) Take 1 tablet by mouth daily.   . ondansetron (ZOFRAN ODT) 4 MG disintegrating tablet Take 1 tablet (4 mg total) by mouth every 8 (eight) hours as needed for nausea or vomiting.  Marland Kitchen oxyCODONE-acetaminophen (PERCOCET/ROXICET) 5-325 MG tablet Take 1 tablet by mouth every 6 (six) hours as needed for moderate pain or severe pain (Must last 30 days.).  Marland Kitchen pantoprazole (PROTONIX) 40 MG tablet Take 1 tablet (40 mg total) by mouth 2 (two) times daily.  . polyethylene glycol (MIRALAX / GLYCOLAX) packet Take 17 g by mouth daily.   Marland Kitchen Propylene Glycol-Glycerin (SOOTHE) 0.6-0.6 % SOLN Place 1 drop into both eyes 2 (two) times daily.  . Psyllium (METAMUCIL PO) Take 1 each by mouth 2 (two) times daily.   Marland Kitchen  rizatriptan (MAXALT-MLT) 10 MG disintegrating tablet Take 10 mg by mouth as needed for migraine. May repeat in 2 hours if needed  . temazepam (RESTORIL) 30 MG capsule Take 1 capsule (30 mg total) by mouth at bedtime as needed for sleep.   No facility-administered encounter medications on file as of 11/27/2018.   :  Review of Systems:  Out of a complete 14 point review of systems, all are reviewed and negative with the exception of these symptoms as listed below: Review of Systems  Neurological:       Pt presents today to discuss her cpap. Pt is wondering if she can get a new cpap machine.    Objective:  Neurological Exam  Physical Exam Physical Examination:   Vitals:   11/27/18 1045  BP: (!) 183/105  Pulse: 62    General Examination: The patient is a very pleasant 58 y.o. female in no acute distress. She appears well-developed and  well-nourished and well groomed.   HEENT:Normocephalic, atraumatic, pupils are equal, round and reactive to light and accommodation. Extraocular tracking is good without limitation to gaze excursion or nystagmus noted. Normal smooth pursuit is noted. Hearing is grossly intact. Face is symmetric with normal facial animation and normal facial sensation. Speech is clear with no dysarthria noted. There is no hypophonia. There is no lip, neck/head, jaw or voice tremor. Oropharynx exam reveals: moderate mouth dryness, adequate dental hygiene and moderate airway crowding. Mallampati is class II. Tongue protrudes centrally and palate elevates symmetrically.   Chest:Clear to auscultation without wheezing, rhonchi or crackles noted.  Heart:S1+S2+0, regular and normal without murmurs, rubs or gallops noted.   Abdomen:Soft, non-tender and non-distended with normal bowel sounds appreciated on auscultation.  Extremities:There is no pitting edema, But puffiness noted in the ankles and feet bilaterally.   Skin: Warm and dry without trophic changes noted. There are no varicose veins.  Musculoskeletal: exam reveals: Bilateral knee pain.   Neurologically:  Mental status: The patient is awake, alert and oriented in all 4 spheres. Her immediate and remote memory, attention, language skills and fund of knowledge are appropriate. There is no evidence of aphasia, agnosia, apraxia or anomia. Speech is clear with normal prosody and enunciation. Thought process is linear. Mood is normal and affect is normal.  Cranial nerves II - XII are as described above under HEENT exam.  Motor exam: Normal bulk, strength and tone is noted. There is no drift, tremor or rebound. Fine motor skills and coordination: grossly intact.  Cerebellar testing: No dysmetria or intention tremor on finger to nose testing. There is no truncal or gait ataxia.  Sensory exam: intact to light touch in the upper and lower extremities.  Gait,  station and balance: She stands with difficulty. She walks with a single-point cane. She walks slowly and cautiously.   Assessment and Plan:   In summary, FRANCISCO EYERLY is a very pleasant 58 year old female with a complex medical history of bipolar disorder, migraine headaches, obesity, s/p lap band surgery (with subsequent regaining of weight), memory problems, colonic polyps, diabetes, anxiety, reflux disease, diverticulitis, s/p partial colectomy in 2004 with chronic constipationand b/l kneeosteoarthritis,who presents for follow up of her OSA, on CPAP therapy on a pressure of 10 cm. She continues to be compliant with treatment. She needs new supplies. She Should be eligible for a new machine.  She had a baseline sleep study in December 2014, CPAP titration study in January 2015 and her set up date for her CPAP machine was 06/08/2013.  We reviewed her most recent compliance data. She has gained some weight, she had lost weight before. She weighed 280 pounds at the time of sleep study testing in December 2014. I will write for a new machine and new equipment, all new supplies and we will send the order to her current DME company.  They should be in touch with her soon.  She is advised that she will need a follow-up within 3 months and she can see 1 of our nurse practitioners and hopefully yearly thereafter.  I answered all her questions today and she was in agreement. I spent 20 minutes in total face-to-face time with the patient, more than 50% of which was spent in counseling and coordination of care, reviewing test results, reviewing medication and discussing or reviewing the diagnosis of OSA, its prognosis and treatment options. Pertinent laboratory and imaging test results that were available during this visit with the patient were reviewed by me and considered in my medical decision making (see chart for details).

## 2018-11-27 NOTE — Progress Notes (Signed)
Order for new cpap sent to [facility] via community message. Confirmation received that the order transmitted was successful.    

## 2018-12-06 ENCOUNTER — Telehealth: Payer: Self-pay

## 2018-12-06 NOTE — Telephone Encounter (Signed)
Oxycodone-Acetaminophen  5/325mg   Qty 120 Tablets  PATIENT USES [facility] ON [address]

## 2018-12-07 MED ORDER — OXYCODONE-ACETAMINOPHEN 5-325 MG PO TABS: 1.0000 | ORAL_TABLET | Freq: Four times a day (QID) | ORAL | 0 refills | Status: DC | PRN

## 2019-01-26 ENCOUNTER — Other Ambulatory Visit (HOSPITAL_COMMUNITY): Payer: Self-pay | Admitting: Family Medicine

## 2019-01-26 DIAGNOSIS — Z1231 Encounter for screening mammogram for malignant neoplasm of breast: Secondary | ICD-10-CM

## 2019-01-30 ENCOUNTER — Telehealth: Payer: Self-pay

## 2019-01-30 NOTE — Telephone Encounter (Signed)
Oxycodone-Acetaminophen  5/325mg  Qty 120 Tablets  PATIENT USES [facility] [address] 

## 2019-01-31 MED ORDER — OXYCODONE-ACETAMINOPHEN 5-325 MG PO TABS: 1.0000 | ORAL_TABLET | Freq: Four times a day (QID) | ORAL | 0 refills | Status: DC | PRN

## 2019-03-02 ENCOUNTER — Ambulatory Visit (HOSPITAL_COMMUNITY)
Admission: RE | Admit: 2019-03-02 | Discharge: 2019-03-02 | Disposition: A | Discharge status: Home / Self Care | Payer: Medicare Other | Source: Ambulatory Visit | Attending: Family Medicine | Admitting: Family Medicine

## 2019-03-02 ENCOUNTER — Other Ambulatory Visit: Payer: Self-pay

## 2019-03-02 DIAGNOSIS — Z1231 Encounter for screening mammogram for malignant neoplasm of breast: Secondary | ICD-10-CM | POA: Diagnosis present

## 2019-03-07 ENCOUNTER — Other Ambulatory Visit: Payer: Self-pay

## 2019-03-07 ENCOUNTER — Encounter: Payer: Self-pay | Admitting: Adult Health

## 2019-03-07 ENCOUNTER — Ambulatory Visit (INDEPENDENT_AMBULATORY_CARE_PROVIDER_SITE_OTHER): Payer: Medicare Other | Admitting: Adult Health

## 2019-03-07 VITALS — BP 154/92 | HR 78 | Temp 97.4°F | Ht 66.0 in | Wt 303.8 lb

## 2019-03-07 DIAGNOSIS — G4733 Obstructive sleep apnea (adult) (pediatric): Secondary | ICD-10-CM

## 2019-03-07 DIAGNOSIS — Z9989 Dependence on other enabling machines and devices: Secondary | ICD-10-CM

## 2019-03-07 NOTE — Patient Instructions (Signed)
-   Will call you with download  Continue using CPAP nightly and >4 hours each night

## 2019-03-07 NOTE — Progress Notes (Addendum)
PATIENT: [name] DOB: 08/01/1960  REASON FOR VISIT: follow up HISTORY FROM: patient  HISTORY OF PRESENT ILLNESS: Today 03/07/19:  [name] is a 58 year old female with a history of obstructive sleep apnea on CPAP.  She returns today for follow-up.  She received a new CPAP machine in July.  Unfortunately we were unable to obtain a download today.  According to her DME company she was not set up for wireless.  The patient states that she is using the CPAP nightly.  She states that she will not sleep at night without it.  She denies any issues with the CPAP or the mask.  She returns today for an evaluation.   REVIEW OF SYSTEMS: Out of a complete 14 system review of symptoms, the patient complains only of the following symptoms, and all other reviewed systems are negative.  Epworth sleepiness score 6 fatigue severity score 18  ALLERGIES: Allergies  Allergen Reactions  . Bactrim Itching, Nausea And Vomiting and Other (See Comments)    Redness  . Neurontin [Gabapentin] Palpitations and Other (See Comments)    confused    HOME MEDICATIONS: Outpatient Medications Prior to Visit  Medication Sig Dispense Refill  . ALPRAZolam (XANAX) 1 MG tablet Take 1 tablet (1 mg total) by mouth 3 (three) times daily as needed for anxiety. 270 tablet 1  . ARIPiprazole (ABILIFY) 10 MG tablet Take 1 tablet (10 mg total) by mouth at bedtime. 90 tablet 2  . cycloSPORINE (RESTASIS) 0.05 % ophthalmic emulsion Place 2 drops into both eyes 2 (two) times daily.     . diclofenac (VOLTAREN) 75 MG EC tablet Take 1 tablet (75 mg total) by mouth 2 (two) times daily. 90 tablet 3  . escitalopram (LEXAPRO) 20 MG tablet Take 1 tablet (20 mg total) by mouth daily. 90 tablet 2  . fluticasone (FLONASE) 50 MCG/ACT nasal spray Place 2 sprays into the nose daily as needed for allergies.     Marland Kitchen gabapentin (NEURONTIN) 100 MG capsule TK ONE C PO QHS  11  . linaclotide (LINZESS) 290 MCG CAPS capsule Take 1 capsule  (290 mcg total) by mouth daily. (Patient taking differently: Take 290 mcg by mouth daily as needed (constipation). ) 90 capsule 3  . lisinopril (PRINIVIL,ZESTRIL) 5 MG tablet     . loratadine (CLARITIN) 10 MG tablet Take 10 mg by mouth daily.    . meclizine (ANTIVERT) 25 MG tablet Take 25 mg by mouth daily as needed for dizziness.   2  . meloxicam (MOBIC) 15 MG tablet Take 1 tablet (15 mg total) by mouth daily. 30 tablet 5  . metFORMIN (GLUCOPHAGE) 500 MG tablet Take 500 mg by mouth 2 (two) times daily.     . methocarbamol (ROBAXIN) 500 MG tablet Take 500 mg by mouth 3 (three) times daily as needed for muscle spasms.     . Multiple Vitamin (MULTIVITAMIN PO) Take 1 tablet by mouth daily.     . ondansetron (ZOFRAN ODT) 4 MG disintegrating tablet Take 1 tablet (4 mg total) by mouth every 8 (eight) hours as needed for nausea or vomiting. 10 tablet 0  . oxyCODONE-acetaminophen (PERCOCET/ROXICET) 5-325 MG tablet Take 1 tablet by mouth every 6 (six) hours as needed for moderate pain or severe pain (Must last 30 days.). 120 tablet 0  . pantoprazole (PROTONIX) 40 MG tablet Take 1 tablet (40 mg total) by mouth 2 (two) times daily. 180 tablet 3  . polyethylene glycol (MIRALAX / GLYCOLAX) packet Take 17  g by mouth daily.     Marland Kitchen Propylene Glycol-Glycerin (SOOTHE) 0.6-0.6 % SOLN Place 1 drop into both eyes 2 (two) times daily.    . Psyllium (METAMUCIL PO) Take 1 each by mouth 2 (two) times daily.     . rizatriptan (MAXALT-MLT) 10 MG disintegrating tablet Take 10 mg by mouth as needed for migraine. May repeat in 2 hours if needed    . temazepam (RESTORIL) 30 MG capsule Take 1 capsule (30 mg total) by mouth at bedtime as needed for sleep. 90 capsule 2   No facility-administered medications prior to visit.     PAST MEDICAL HISTORY: Past Medical History:  Diagnosis Date  . Adenomatous polyp 2009  . Anemia 2003 after surgery needed 1 unit blood  . Anxiety   . Arthritis   . Bipolar 1 disorder ([place])   .  Constipation   . Depression   . Diabetes mellitus   . Diabetes mellitus, type II ([place])   . Diverticula of colon 2009  . Generalized headaches   . GERD (gastroesophageal reflux disease)   . History of hiatal hernia    small  . Hypertension   . Migraine   . Obesity   . Pelvic floor dysfunction    abnormal [facility]orectal [facility]ometry [facility] in 2009  . Plantar fasciitis both feet  . Psoriasis   . Sleep apnea    cpap setting of 3.5    PAST SURGICAL HISTORY: Past Surgical History:  Procedure Laterality Date  . ABDOMINAL HYSTERECTOMY     compete  . CATARACT EXTRACTION W/PHACO Right 02/15/2017   Procedure: CATARACT EXTRACTION PHACO AND INTRAOCULAR LENS PLACEMENT (IOC);  Surgeon: Rutherford Guys, MD;  Location: [facility];  Service: Ophthalmology;  Laterality: Right;  CDE: 4.18  . CATARACT EXTRACTION W/PHACO Left 03/01/2017   Procedure: CATARACT EXTRACTION PHACO AND INTRAOCULAR LENS PLACEMENT (IOC);  Surgeon: Rutherford Guys, MD;  Location: [facility];  Service: Ophthalmology;  Laterality: Left;  CDE: 2.56  . COLON RESECTION    03/30/2002   with end-colostomy and Hartmann's pouch  . COLON SURGERY     complicated diverticulitis requiring sigmoid resection with colostomy and subsequent takedown  . COLONOSCOPY  12/11/2007   Dr. Gala Romney- marginal prep, normal rectum pancolonic diverticula, adenomatous polyp  . COLONOSCOPY  08/28/2003      Wide open colonic anastomosis/Scattered diverticula noted throughout colon/ Small external hemorrhoids  . COLONOSCOPY  04/2012   [facility]: hyperplastic polyps, diverticulosis, ileocolonic anastomosis.  . COLONOSCOPY WITH PROPOFOL N/A 06/07/2016   Procedure: COLONOSCOPY WITH PROPOFOL;  Surgeon: Daneil Dolin, MD;  [facility];  Service: Endoscopy;  Laterality: N/A;  7:30 am  . COLOSTOMY CLOSURE  07/10/2002  . ESOPHAGOGASTRODUODENOSCOPY (EGD) WITH PROPOFOL N/A 08/23/2016   Procedure: ESOPHAGOGASTRODUODENOSCOPY (EGD) WITH PROPOFOL;  Surgeon: Alphonsa Overall, MD;  [facility];  Service: General;  Laterality: N/A;  . FLEXIBLE SIGMOIDOSCOPY  01/20/2012   RMR: incomplete/attempted colonoscopy. Inadequate prep precluded examination  . HEEL SPUR SURGERY Left 09/11/2013   both have been done  . KNEE ARTHROSCOPY  02/04/2004    left knee/partial medial meniscectomy.  Marland Kitchen KNEE SURGERY     3 arthroscopic  2 on left 1 on right  . LAPAROSCOPIC GASTRIC BANDING  2009  . LAPAROSCOPIC SALPINGOOPHERECTOMY  03/30/2002  . TUBAL LIGATION      FAMILY HISTORY: Family History  Problem Relation Age of Onset  . Ovarian cancer Mother   . Heart disease Mother   . Anxiety disorder Mother   .  Cirrhosis Father        deceased age 64  . Alcohol abuse Father   . Liver cancer Cousin        age 1, deceased  . Drug abuse Cousin   . ADD / ADHD Son   . Anxiety disorder Sister   . Dementia Maternal Grandfather   . Colon cancer Other        aunt, deceased age 59  . Breast cancer Other        aunt, deceased age 56  . Bipolar disorder Neg Hx   . Depression Neg Hx   . OCD Neg Hx   . Paranoid behavior Neg Hx   . Schizophrenia Neg Hx   . Seizures Neg Hx   . Sexual abuse Neg Hx   . Physical abuse Neg Hx     SOCIAL HISTORY: Social History   Socioeconomic History  . Marital status: Married    Spouse name: Not on file  . Number of children: 2  . Years of education: college  . Highest education level: Not on file  Occupational History  . 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 Financial resource strain: Not on file  . Food insecurity    Worry: Not on file    Inability: Not on file  . Transportation needs    Medical: Not on file    Non-medical: Not on file  Tobacco Use  . Smoking status: Never Smoker  . Smokeless tobacco: Never Used  Substance and Sexual Activity  . Alcohol use: Yes    Comment: occasionally wine  . Drug use: No  . Sexual activity: Not Currently    Birth control/protection: Surgical   Lifestyle  . Physical activity    Days per week: Not on file    Minutes per session: Not on file  . Stress: Not on file  Relationships  . Social Herbalist on phone: Not on file    Gets together: Not on file    Attends religious service: Not on file    Active member of club or organization: Not on file    Attends meetings of clubs or organizations: Not on file    Relationship status: Not on file  . Intimate partner violence    Fear of current or ex partner: Not on file    Emotionally abused: Not on file    Physically abused: Not on file    Forced sexual activity: Not on file  Other Topics Concern  . Not on file  Social History Narrative  . Not on file      PHYSICAL EXAM  Vitals:   03/07/19 1414  BP: (!) 154/92  Pulse: 78  Temp: (!) 97.4 F (36.3 C)  TempSrc: Oral  Weight: (!) 303 lb 12.8 oz (137.8 kg)  Height: 5\' 6"  (1.676 m)   Body mass index is 49.03 kg/m.   Generalized: Well developed, in no acute distress  Chest: Lungs clear to auscultation bilaterally  Neurological examination  Mentation: Alert oriented to time, place, history taking. Follows all commands speech and language fluent Cranial nerve II-XII: Extraocular movements were full, visual field were full on confrontational test Head turning and shoulder shrug  were normal and symmetric. Motor: The motor testing reveals 5 over 5 strength of all 4 extremities. Good symmetric motor tone is noted throughout.  Sensory: Sensory testing is intact to soft touch on all 4 extremities.  No evidence of extinction is noted.  Gait and station: Gait is normal.    DIAGNOSTIC DATA (LABS, IMAGING, TESTING) - I reviewed patient records, labs, notes, testing and imaging myself where available.  Lab Results  Component Value Date   WBC 6.3 02/09/2017   HGB 13.8 02/09/2017   HCT 41.7 02/09/2017   MCV 88.5 02/09/2017   PLT 188 02/09/2017      Component Value Date/Time   NA 136 02/09/2017 0753   K 4.1  02/09/2017 0753   CL 102 02/09/2017 0753   CO2 27 02/09/2017 0753   GLUCOSE 195 (H) 02/09/2017 0753   BUN 22 (H) 02/09/2017 0753   CREATININE 0.71 02/09/2017 0753   CREATININE 0.74 03/23/2011 0400   CALCIUM 8.8 (L) 02/09/2017 0753   PROT 7.8 11/21/2016 1005   ALBUMIN 4.0 11/21/2016 1005   AST 31 11/21/2016 1005   ALT 34 11/21/2016 1005   ALKPHOS 83 11/21/2016 1005   BILITOT 0.7 11/21/2016 1005   GFRNONAA >60 02/09/2017 0753   GFRAA >60 02/09/2017 0753   Lab Results  Component Value Date   [id number] 03/05/2013   Lab Results  Component Value Date   TSH 2.020 03/05/2013      ASSESSMENT AND PLAN 58 y.o. year old female  has a past medical history of Adenomatous polyp (2009), Anemia (2003 after surgery needed 1 unit blood), Anxiety, Arthritis, Bipolar 1 disorder ([place]), Constipation, Depression, Diabetes mellitus, Diabetes mellitus, type II ([place]), Diverticula of colon (2009), Generalized headaches, GERD (gastroesophageal reflux disease), History of hiatal hernia, Hypertension, Migraine, Obesity, Pelvic floor dysfunction, Plantar fasciitis (both feet), Psoriasis, and Sleep apnea. here with:  1.  Obstructive sleep apnea on CPAP  We will work with the patient's DME company to ensure that she gets set up on wireless and we obtain a download.  Once I have the download I will review with the patient.  She is encouraged continue using the CPAP nightly and greater than 4 hours each night.  She will follow-up in 1 year or sooner if needed.  I spent 15 minutes with the patient. 50% of this time was spent discussing plan of care   Ward Givens, MSN, NP-C 03/07/2019, 3:01 PM [facility] [address] [place], [place] [address] [phone]  I reviewed the above note and documentation by the Nurse Practitioner and agree with the history, exam, assessment and plan as outlined above. I was available for consultation. [name], MD, PhD [facility])

## 2019-03-07 NOTE — Progress Notes (Signed)
The patient CPAP download shows that she use her machine 81 at 83 days for compliance of 98%.  She use her machine greater than 4 hours 74 days for compliance of 89%.  On average she uses her machine 6 hours and 19 minutes.  Her residual AHI is 1.2 on 10 cmH2O with EPR 3.  Her leak in the 95th percentile is 19.2.  The patient was made aware of these results by the RN.  She does not feel her mask leaking at night.  We will continue to monitor.

## 2019-03-09 ENCOUNTER — Other Ambulatory Visit: Payer: Medicare Other

## 2019-03-09 ENCOUNTER — Telehealth: Payer: Self-pay | Admitting: *Deleted

## 2019-03-09 ENCOUNTER — Telehealth: Payer: Self-pay | Admitting: Obstetrics & Gynecology

## 2019-03-09 MED ORDER — TERCONAZOLE 0.4 % VA CREA: 1.0000 | TOPICAL_CREAM | Freq: Every day | VAGINAL | 0 refills | Status: DC

## 2019-03-09 NOTE — Telephone Encounter (Signed)
Patient informed Terconazole has been sent to pharmacy.  States she is having hot flashes as well and wants to know if a cream can be sent in for this.Please advise.

## 2019-03-09 NOTE — Telephone Encounter (Signed)
Patient states she has had a yeast infection for 3 weeks.  She has taken 3 doses of Diflucan and nothing seems to be helping.  Wants to know if something else can be sent in for her or if she needs to be seen.  Please advise.

## 2019-03-10 NOTE — Telephone Encounter (Signed)
She will need to make at least a MyChart connect appointment to address that

## 2019-03-12 ENCOUNTER — Encounter: Payer: Self-pay | Admitting: *Deleted

## 2019-03-13 ENCOUNTER — Encounter: Payer: Self-pay | Admitting: Orthopaedic Surgery

## 2019-03-13 ENCOUNTER — Other Ambulatory Visit: Payer: Self-pay

## 2019-03-13 ENCOUNTER — Ambulatory Visit (INDEPENDENT_AMBULATORY_CARE_PROVIDER_SITE_OTHER): Payer: Medicare Other | Admitting: Orthopaedic Surgery

## 2019-03-13 VITALS — BP 151/90 | HR 69 | Temp 97.0°F

## 2019-03-13 DIAGNOSIS — G8929 Other chronic pain: Secondary | ICD-10-CM | POA: Diagnosis not present

## 2019-03-13 DIAGNOSIS — M25561 Pain in right knee: Secondary | ICD-10-CM | POA: Diagnosis not present

## 2019-03-13 DIAGNOSIS — M25562 Pain in left knee: Secondary | ICD-10-CM | POA: Diagnosis not present

## 2019-03-13 DIAGNOSIS — Z6841 Body Mass Index (BMI) 40.0 and over, adult: Secondary | ICD-10-CM

## 2019-03-13 MED ORDER — OXYCODONE-ACETAMINOPHEN 5-325 MG PO TABS: 1.0000 | ORAL_TABLET | Freq: Four times a day (QID) | ORAL | 0 refills | Status: DC | PRN

## 2019-03-13 MED ORDER — DICLOFENAC SODIUM 75 MG PO TBEC: 75.0000 mg | DELAYED_RELEASE_TABLET | Freq: Two times a day (BID) | ORAL | 3 refills | Status: DC

## 2019-03-13 NOTE — Progress Notes (Signed)
[name], female DOB:02/04/1961, 58 y.o. [id number]  Chief Complaint  Patient presents with  . Knee Pain    Chronic pain of right knee.    HPI  [name] is a 58 y.o. female who has chronic pain of the knees. She has popping and swelling.  She had blood sugar problem over the weekend and had to call EMS.  Her blood sugar was over 350.  She was to have injection today but I feel it is not appropriate to get some prednisone today.  She understands.  I also talked to her about her diet and reducing calories and increasing protein consumption and decreasing carbohydrates. We talked about his for about 20 minutes.   There is no height or weight on file to calculate BMI.  ROS  Review of Systems  Constitutional:       Patient has Diabetes Mellitus. Patient has hypertension. Patient does not have COPD or shortness of breath. Patient has BMI > 35. Patient does not have current smoking history.  HENT: Negative for congestion.   Respiratory: Positive for cough. Negative for shortness of breath.   Endocrine: Positive for cold intolerance.  Musculoskeletal: Positive for arthralgias, gait problem and joint swelling.  Allergic/Immunologic: Positive for environmental allergies.  Neurological: Positive for headaches.  Psychiatric/Behavioral: The patient is nervous/anxious.     All other systems reviewed and are negative.  The following is a summary of the past history medically, past history surgically, known current medicines, social history and family history.  This information is gathered electronically by the computer from prior information and documentation.  I review this each visit and have found including this information at this point in the chart is beneficial and informative.    Past Medical History:  Diagnosis Date  . Adenomatous polyp 2009  . Anemia 2003 after surgery needed 1 unit blood  . Anxiety   . Arthritis   . Bipolar 1 disorder ([place])   .  Constipation   . Depression   . Diabetes mellitus   . Diabetes mellitus, type II ([place])   . Diverticula of colon 2009  . Generalized headaches   . GERD (gastroesophageal reflux disease)   . History of hiatal hernia    small  . Hypertension   . Migraine   . Obesity   . Pelvic floor dysfunction    abnormal anorectal manometry at [facility] in 2009  . Plantar fasciitis both feet  . Psoriasis   . Sleep apnea    cpap setting of 3.5    Past Surgical History:  Procedure Laterality Date  . ABDOMINAL HYSTERECTOMY     compete  . CATARACT EXTRACTION W/PHACO Right 02/15/2017   Procedure: CATARACT EXTRACTION PHACO AND INTRAOCULAR LENS PLACEMENT (IOC);  Surgeon: Rutherford Guys, MD;  Location: [facility];  Service: Ophthalmology;  Laterality: Right;  CDE: 4.18  . CATARACT EXTRACTION W/PHACO Left 03/01/2017   Procedure: CATARACT EXTRACTION PHACO AND INTRAOCULAR LENS PLACEMENT (IOC);  Surgeon: Rutherford Guys, MD;  Location: [facility];  Service: Ophthalmology;  Laterality: Left;  CDE: 2.56  . COLON RESECTION    03/30/2002   with end-colostomy and Hartmann's pouch  . COLON SURGERY     complicated diverticulitis requiring sigmoid resection with colostomy and subsequent takedown  . COLONOSCOPY  12/11/2007   Dr. Gala Romney- marginal prep, normal rectum pancolonic diverticula, adenomatous polyp  . COLONOSCOPY  08/28/2003      Wide open colonic anastomosis/Scattered diverticula noted throughout colon/ Small external hemorrhoids  . COLONOSCOPY  04/2012   [facility]: hyperplastic polyps, diverticulosis, ileocolonic anastomosis.  . COLONOSCOPY WITH PROPOFOL N/A 06/07/2016   Procedure: COLONOSCOPY WITH PROPOFOL;  Surgeon: Daneil Dolin, MD;  [facility];  Service: Endoscopy;  Laterality: N/A;  7:30 am  . COLOSTOMY CLOSURE  07/10/2002  . ESOPHAGOGASTRODUODENOSCOPY (EGD) WITH PROPOFOL N/A 08/23/2016   Procedure: ESOPHAGOGASTRODUODENOSCOPY (EGD) WITH PROPOFOL;  Surgeon: Alphonsa Overall, MD;  [facility];  Service:  General;  Laterality: N/A;  . FLEXIBLE SIGMOIDOSCOPY  01/20/2012   RMR: incomplete/attempted colonoscopy. Inadequate prep precluded examination  . HEEL SPUR SURGERY Left 09/11/2013   both have been done  . KNEE ARTHROSCOPY  02/04/2004    left knee/partial medial meniscectomy.  Marland Kitchen KNEE SURGERY     3 arthroscopic  2 on left 1 on right  . LAPAROSCOPIC GASTRIC BANDING  2009  . LAPAROSCOPIC SALPINGOOPHERECTOMY  03/30/2002  . TUBAL LIGATION      Family History  Problem Relation Age of Onset  . Ovarian cancer Mother   . Heart disease Mother   . Anxiety disorder Mother   . Cirrhosis Father        deceased age 58  . Alcohol abuse Father   . Liver cancer Cousin        age 59, deceased  . Drug abuse Cousin   . ADD / ADHD Son   . Anxiety disorder Sister   . Dementia Maternal Grandfather   . Colon cancer Other        aunt, deceased age 15  . Breast cancer Other        aunt, deceased age 44  . Bipolar disorder Neg Hx   . Depression Neg Hx   . OCD Neg Hx   . Paranoid behavior Neg Hx   . Schizophrenia Neg Hx   . Seizures Neg Hx   . Sexual abuse Neg Hx   . Physical abuse Neg Hx     Social History Social History   Tobacco Use  . Smoking status: Never Smoker  . Smokeless tobacco: Never Used  Substance Use Topics  . Alcohol use: Yes    Comment: occasionally wine  . Drug use: No    Allergies  Allergen Reactions  . Bactrim Itching, Nausea And Vomiting and Other (See Comments)    Redness  . Neurontin [Gabapentin] Palpitations and Other (See Comments)    confused    Current Outpatient Medications  Medication Sig Dispense Refill  . ALPRAZolam (XANAX) 1 MG tablet Take 1 tablet (1 mg total) by mouth 3 (three) times daily as needed for anxiety. 270 tablet 1  . ARIPiprazole (ABILIFY) 10 MG tablet Take 1 tablet (10 mg total) by mouth at bedtime. 90 tablet 2  . cycloSPORINE (RESTASIS) 0.05 % ophthalmic emulsion Place 2 drops into both eyes 2 (two) times daily.     . diclofenac  (VOLTAREN) 75 MG EC tablet Take 1 tablet (75 mg total) by mouth 2 (two) times daily. 90 tablet 3  . escitalopram (LEXAPRO) 20 MG tablet Take 1 tablet (20 mg total) by mouth daily. 90 tablet 2  . fluticasone (FLONASE) 50 MCG/ACT nasal spray Place 2 sprays into the nose daily as needed for allergies.     Marland Kitchen gabapentin (NEURONTIN) 100 MG capsule TK ONE C PO QHS  11  . linaclotide (LINZESS) 290 MCG CAPS capsule Take 1 capsule (290 mcg total) by mouth daily. (Patient taking differently: Take 290 mcg by mouth daily as needed (constipation). ) 90 capsule 3  . lisinopril (  PRINIVIL,ZESTRIL) 5 MG tablet     . loratadine (CLARITIN) 10 MG tablet Take 10 mg by mouth daily.    . meclizine (ANTIVERT) 25 MG tablet Take 25 mg by mouth daily as needed for dizziness.   2  . meloxicam (MOBIC) 15 MG tablet Take 1 tablet (15 mg total) by mouth daily. 30 tablet 5  . metFORMIN (GLUCOPHAGE) 500 MG tablet Take 500 mg by mouth 2 (two) times daily.     . methocarbamol (ROBAXIN) 500 MG tablet Take 500 mg by mouth 3 (three) times daily as needed for muscle spasms.     . Multiple Vitamin (MULTIVITAMIN PO) Take 1 tablet by mouth daily.     . ondansetron (ZOFRAN ODT) 4 MG disintegrating tablet Take 1 tablet (4 mg total) by mouth every 8 (eight) hours as needed for nausea or vomiting. 10 tablet 0  . oxyCODONE-acetaminophen (PERCOCET/ROXICET) 5-325 MG tablet Take 1 tablet by mouth every 6 (six) hours as needed for moderate pain or severe pain (Must last 30 days.). 120 tablet 0  . pantoprazole (PROTONIX) 40 MG tablet Take 1 tablet (40 mg total) by mouth 2 (two) times daily. 180 tablet 3  . polyethylene glycol (MIRALAX / GLYCOLAX) packet Take 17 g by mouth daily.     Marland Kitchen Propylene Glycol-Glycerin (SOOTHE) 0.6-0.6 % SOLN Place 1 drop into both eyes 2 (two) times daily.    . Psyllium (METAMUCIL PO) Take 1 each by mouth 2 (two) times daily.     . rizatriptan (MAXALT-MLT) 10 MG disintegrating tablet Take 10 mg by mouth as needed for  migraine. May repeat in 2 hours if needed    . temazepam (RESTORIL) 30 MG capsule Take 1 capsule (30 mg total) by mouth at bedtime as needed for sleep. 90 capsule 2  . terconazole (TERAZOL 7) 0.4 % vaginal cream Place 1 applicator vaginally at bedtime. 45 g 0   No current facility-administered medications for this visit.      Physical Exam  Blood pressure (!) 151/90, pulse 69, temperature (!) 97 F (36.1 C).  Constitutional: overall normal hygiene, normal nutrition, well developed, normal grooming, normal body habitus. Assistive device:cane  Musculoskeletal: gait and station Limp right, muscle tone and strength are normal, no tremors or atrophy is present.  .  Neurological: coordination overall normal.  Deep tendon reflex/nerve stretch intact.  Sensation normal.  Cranial nerves II-XII intact.   Skin:   Normal overall no scars, lesions, ulcers or rashes. No psoriasis.  Psychiatric: Alert and oriented x 3.  Recent memory intact, remote memory unclear.  Normal mood and affect. Well groomed.  Good eye contact.  Cardiovascular: overall no swelling, no varicosities, no edema bilaterally, normal temperatures of the legs and arms, no clubbing, cyanosis and good capillary refill.  Lymphatic: palpation is normal.  Both knees are tender with effusion, crepitus, ROM right 0 to 105, left 0 to 110, limp on the right.  NV intact. All other systems reviewed and are negative   The patient has been educated about the nature of the problem(s) and counseled on treatment options.  The patient appeared to understand what I have discussed and is in agreement with it.  Encounter Diagnoses  Name Primary?  . Chronic pain of right knee Yes  . Chronic pain of left knee   . Body mass index 45.0-49.9, adult ([place])   . Morbid obesity ([place])     PLAN Call if any problems.  Precautions discussed.  Continue current medications.   Return to  clinic 1 month   Electronically Signed Sanjuana Kava,  MD 11/3/202011:23 AM

## 2019-03-14 ENCOUNTER — Telehealth (HOSPITAL_COMMUNITY): Payer: Self-pay | Admitting: *Deleted

## 2019-03-14 ENCOUNTER — Other Ambulatory Visit (HOSPITAL_COMMUNITY): Payer: Self-pay | Admitting: Psychiatry

## 2019-03-14 ENCOUNTER — Ambulatory Visit (INDEPENDENT_AMBULATORY_CARE_PROVIDER_SITE_OTHER): Payer: Medicare Other | Admitting: Psychiatry

## 2019-03-14 ENCOUNTER — Encounter (HOSPITAL_COMMUNITY): Payer: Self-pay | Admitting: Psychiatry

## 2019-03-14 DIAGNOSIS — F3162 Bipolar disorder, current episode mixed, moderate: Secondary | ICD-10-CM | POA: Diagnosis not present

## 2019-03-14 MED ORDER — TEMAZEPAM 30 MG PO CAPS: 30.0000 mg | ORAL_CAPSULE | Freq: Every evening | ORAL | 2 refills | Status: DC | PRN

## 2019-03-14 MED ORDER — ESCITALOPRAM OXALATE 20 MG PO TABS: 20.0000 mg | ORAL_TABLET | Freq: Every day | ORAL | 2 refills | Status: DC

## 2019-03-14 MED ORDER — ARIPIPRAZOLE 10 MG PO TABS: 10.0000 mg | ORAL_TABLET | Freq: Every day | ORAL | 2 refills | Status: DC

## 2019-03-14 MED ORDER — ALPRAZOLAM 1 MG PO TABS: 1.0000 mg | ORAL_TABLET | Freq: Three times a day (TID) | ORAL | 1 refills | Status: DC | PRN
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
## 2019-03-14 NOTE — Telephone Encounter (Signed)
sent 

## 2019-03-14 NOTE — Telephone Encounter (Signed)
PATIENT CALLED AFTER RECEIVING CALL FROM [facility] THAT HER MEDICATION WAS READY FOR PICK-UP. PATIENT STATED THAT [facility] REQUESTED MED'S BE SENT TO [facility] (ADDED IN [facility]). SHE NOTIFIED [facility] THAT SHE WOULD NOT BE PICKING UP THOSE MEDICATION'S  & CALLED TO REQUEST NEW SCRIPTS TO BE SENT TO [facility](ADDED IN [facility])

## 2019-04-02 ENCOUNTER — Encounter (HOSPITAL_COMMUNITY): Payer: Self-pay | Admitting: Emergency Medicine

## 2019-04-02 ENCOUNTER — Emergency Department (HOSPITAL_COMMUNITY)
Admission: EM | Admit: 2019-04-02 | Discharge: 2019-04-02 | Disposition: A | Discharge status: Home / Self Care | Payer: Medicare Other | Attending: Emergency Medicine | Admitting: Emergency Medicine

## 2019-04-02 ENCOUNTER — Other Ambulatory Visit: Payer: Self-pay

## 2019-04-02 ENCOUNTER — Emergency Department (HOSPITAL_COMMUNITY): Payer: Medicare Other

## 2019-04-02 ENCOUNTER — Telehealth: Payer: Self-pay | Admitting: Nutrition

## 2019-04-02 DIAGNOSIS — I10 Essential (primary) hypertension: Secondary | ICD-10-CM | POA: Diagnosis not present

## 2019-04-02 DIAGNOSIS — Z79899 Other long term (current) drug therapy: Secondary | ICD-10-CM | POA: Diagnosis not present

## 2019-04-02 DIAGNOSIS — R0789 Other chest pain: Secondary | ICD-10-CM | POA: Insufficient documentation

## 2019-04-02 DIAGNOSIS — E119 Type 2 diabetes mellitus without complications: Secondary | ICD-10-CM | POA: Diagnosis not present

## 2019-04-02 DIAGNOSIS — R079 Chest pain, unspecified: Secondary | ICD-10-CM

## 2019-04-02 DIAGNOSIS — Z7984 Long term (current) use of oral hypoglycemic drugs: Secondary | ICD-10-CM | POA: Insufficient documentation

## 2019-04-02 LAB — BASIC METABOLIC PANEL
Anion gap: 12 (ref 5–15)
BUN: 22 mg/dL — ABNORMAL HIGH (ref 6–20)
CO2: 24 mmol/L (ref 22–32)
Calcium: 9.3 mg/dL (ref 8.9–10.3)
Chloride: 102 mmol/L (ref 98–111)
Creatinine, Ser: 0.6 mg/dL (ref 0.44–1.00)
GFR calc Af Amer: >60 mL/min (ref >60)
GFR calc non Af Amer: >60 mL/min (ref >60)
Glucose, Bld: 147 mg/dL — ABNORMAL HIGH (ref 70–99)
Potassium: 4.1 mmol/L (ref 3.5–5.1)
Sodium: 138 mmol/L (ref 135–145)

## 2019-04-02 LAB — HEPATIC FUNCTION PANEL
ALT: 39 U/L (ref 0–44)
AST: 31 U/L (ref 15–41)
Albumin: 4.1 g/dL (ref 3.5–5.0)
Alkaline Phosphatase: 89 U/L (ref 38–126)
Bilirubin, Direct: 0.1 mg/dL (ref 0.0–0.2)
Indirect Bilirubin: 0.5 mg/dL (ref 0.3–0.9)
Total Bilirubin: 0.6 mg/dL (ref 0.3–1.2)
Total Protein: 8 g/dL (ref 6.5–8.1)

## 2019-04-02 LAB — CBC
HCT: 49.1 % — ABNORMAL HIGH (ref 36.0–46.0)
Hemoglobin: 15.5 g/dL — ABNORMAL HIGH (ref 12.0–15.0)
MCH: 28.2 pg (ref 26.0–34.0)
MCHC: 31.6 g/dL (ref 30.0–36.0)
MCV: 89.3 fL (ref 80.0–100.0)
Platelets: 225 10*3/uL (ref 150–400)
RBC: 5.5 MIL/uL — ABNORMAL HIGH (ref 3.87–5.11)
RDW: 12.9 % (ref 11.5–15.5)
WBC: 7.4 10*3/uL (ref 4.0–10.5)
nRBC: 0 % (ref 0.0–0.2)

## 2019-04-02 LAB — TROPONIN I (HIGH SENSITIVITY)
Troponin I (High Sensitivity): 3 ng/L (ref <18)
Troponin I (High Sensitivity): 3 ng/L (ref <18)

## 2019-04-02 MED ORDER — ONDANSETRON HCL 4 MG/2ML IJ SOLN
4.0000 mg | Freq: Once | INTRAMUSCULAR | Status: AC
  Administered 2019-04-02: 15:00:00 4 mg via INTRAVENOUS
  Filled 2019-04-02: qty 2

## 2019-04-02 MED ORDER — MORPHINE SULFATE (PF) 2 MG/ML IV SOLN
2.0000 mg | Freq: Once | INTRAVENOUS | Status: AC
  Administered 2019-04-02: 2 mg via INTRAVENOUS
  Filled 2019-04-02: qty 1

## 2019-04-02 MED ORDER — MORPHINE SULFATE (PF) 4 MG/ML IV SOLN
4.0000 mg | Freq: Once | INTRAVENOUS | Status: AC
  Administered 2019-04-02: 4 mg via INTRAVENOUS
  Filled 2019-04-02: qty 1

## 2019-04-02 MED ORDER — SODIUM CHLORIDE 0.9% FLUSH
3.0000 mL | Freq: Once | INTRAVENOUS | Status: AC
  Administered 2019-04-02: 3 mL via INTRAVENOUS

## 2019-04-02 NOTE — ED Provider Notes (Signed)
[facility] EMERGENCY DEPARTMENT Provider Note   CSN: [id number] Arrival date & time: 04/02/19  1021     History   Chief Complaint Chief Complaint  Patient presents with  . Chest Pain    HPI [name] is a 58 y.o. female has no history of anxiety, bipolar, diabetes, who presents for evaluation of chest pain that began 3 weeks ago.  Patient reports that the chest pain began after she excellently took some CBD oil and took a larger dose than she was supposed to take.  She states that this caused her blood sugar to go high and she started developing chest pain.  She describes it as a pressure across her anterior chest.  She states it is worse with palpation, deep inspiration and with exertion.  She has had some associated nausea but no diaphoresis, vomiting, shortness of breath.  He has not taken any medication for the pain.  She does state that if she does rest still, it improves the pain somewhat but it never goes away.  She has not had any associated fevers, cough, chills.  She has not sought evaluation for this.  Patient states that the chest pain has been constant for 3 weeks and has never gone away or resolved.  Patient states that she does not have any personal cardiac history.  She denies any family history of MI before the age of 63.  She is not a current smoker.  She does report history of diabetes and hypertension.  Patient denies any abdominal pain, fever, cough, sick contacts.  No known COVID-19 exposure.  She does report that she is very stressed at home and feels like stress makes her situation worse.  She reports some PTSD issues with her husband.  She states that she does not have any suicidal ideation.  HPI: A 58 year old patient with a history of treated diabetes presents for evaluation of chest pain. Initial onset of pain was less than one hour ago. The patient's chest pain is described as heaviness/pressure/tightness and is not worse with exertion. The patient complains  of nausea. The patient's chest pain is middle- or left-sided, is not well-localized, is not sharp and does not radiate to the arms/jaw/neck. The patient denies diaphoresis. The patient has no history of stroke, has no history of peripheral artery disease, has not smoked in the past 90 days, has no relevant family history of coronary artery disease (first degree relative at less than age 65), is not hypertensive, has no history of hypercholesterolemia and does not have an elevated BMI (>=30).   The history is provided by the patient.    Past Medical History:  Diagnosis Date  . Adenomatous polyp 2009  . Anemia 2003 after surgery needed 1 unit blood  . Anxiety   . Arthritis   . Bipolar 1 disorder ([place])   . Constipation   . Depression   . Diabetes mellitus   . Diabetes mellitus, type II ([place])   . Diverticula of colon 2009  . Generalized headaches   . GERD (gastroesophageal reflux disease)   . History of hiatal hernia    small  . Hypertension   . Migraine   . Obesity   . Pelvic floor dysfunction    abnormal anorectal [facility]ometry [facility] in 2009  . Plantar fasciitis both feet  . Psoriasis   . Sleep apnea    cpap setting of 3.5    Patient Active Problem List   Diagnosis Date Noted  . Plantar fasciitis  of right foot 06/14/2018  . Osteoarthritis of left knee 04/05/2018  . Osteoarthritis of right knee 04/05/2018  . Hx of adenomatous colonic polyps 06/04/2016  . LLQ pain 12/31/2014  . Abdominal pain, RLQ (right lower quadrant) 04/24/2014  . OSA on CPAP 10/18/2013  . Alcohol abuse 01/01/2013  . Migraine without aura, with intractable migraine, so stated, without mention of status migrainosus 09/21/2012  . Memory loss 09/21/2012  . Lapband APL Nov 2009 06/21/2012  . Outbursts of anger 03/31/2012  . FH: colon cancer 11/03/2011  . Rectal bleed 11/03/2011  . Esophageal dysphagia 11/03/2011  . COLONIC POLYPS 10/10/2008  . DIABETES MELLITUS, TYPE II 09/02/2008  . OBESITY 09/02/2008  .  ANXIETY NEUROSIS 09/02/2008  . Severe mood disorder without psychotic features ([place]) 09/02/2008  . GERD 09/02/2008  . Constipation 09/02/2008  . OSTEOARTHRITIS 09/02/2008    Past Surgical History:  Procedure Laterality Date  . ABDOMINAL HYSTERECTOMY     compete  . CATARACT EXTRACTION W/PHACO Right 02/15/2017   Procedure: CATARACT EXTRACTION PHACO AND INTRAOCULAR LENS PLACEMENT (IOC);  Surgeon: Rutherford Guys, MD;  Location: [facility] ORS;  Service: Ophthalmology;  Laterality: Right;  CDE: 4.18  . CATARACT EXTRACTION W/PHACO Left 03/01/2017   Procedure: CATARACT EXTRACTION PHACO AND INTRAOCULAR LENS PLACEMENT (IOC);  Surgeon: Rutherford Guys, MD;  Location: [facility] ORS;  Service: Ophthalmology;  Laterality: Left;  CDE: 2.56  . COLON RESECTION    03/30/2002   with end-colostomy and Hartmann's pouch  . COLON SURGERY     complicated diverticulitis requiring sigmoid resection with colostomy and subsequent takedown  . COLONOSCOPY  12/11/2007   Dr. Gala Romney- marginal prep, normal rectum pancolonic diverticula, adenomatous polyp  . COLONOSCOPY  08/28/2003      Wide open colonic anastomosis/Scattered diverticula noted throughout colon/ Small external hemorrhoids  . COLONOSCOPY  04/2012   [facility]: hyperplastic polyps, diverticulosis, ileocolonic anastomosis.  . COLONOSCOPY WITH PROPOFOL N/A 06/07/2016   Procedure: COLONOSCOPY WITH PROPOFOL;  Surgeon: Daneil Dolin, MD;  Location: [facility] ENDO SUITE;  Service: Endoscopy;  Laterality: N/A;  7:30 am  . COLOSTOMY CLOSURE  07/10/2002  . ESOPHAGOGASTRODUODENOSCOPY (EGD) WITH PROPOFOL N/A 08/23/2016   Procedure: ESOPHAGOGASTRODUODENOSCOPY (EGD) WITH PROPOFOL;  Surgeon: Alphonsa Overall, MD;  Location: [facility] ENDOSCOPY;  Service: General;  Laterality: N/A;  . FLEXIBLE SIGMOIDOSCOPY  01/20/2012   RMR: incomplete/attempted colonoscopy. Inadequate prep precluded examination  . HEEL SPUR SURGERY Left 09/11/2013   both have been done  . KNEE ARTHROSCOPY  02/04/2004    left knee/partial medial  meniscectomy.  Marland Kitchen KNEE SURGERY     3 arthroscopic  2 on left 1 on right  . LAPAROSCOPIC GASTRIC BANDING  2009  . LAPAROSCOPIC SALPINGOOPHERECTOMY  03/30/2002  . TUBAL LIGATION       OB History    Gravida  2   Para  2   Term      Preterm      AB      Living  2     SAB      TAB      Ectopic      Multiple      Live Births               Home Medications    Prior to Admission medications   Medication Sig Start Date End Date Taking? Authorizing Provider  ALPRAZolam Duanne Moron) 1 MG tablet Take 1 tablet (1 mg total) by mouth 3 (three) times daily as needed for anxiety. 03/14/19   Cloria Spring, MD  ARIPiprazole (ABILIFY) 10 MG tablet Take 1 tablet (10 mg total) by mouth at bedtime. 03/14/19   Cloria Spring, MD  cycloSPORINE (RESTASIS) 0.05 % ophthalmic emulsion Place 2 drops into both eyes 2 (two) times daily.     [provider]  diclofenac (VOLTAREN) 75 MG EC tablet Take 1 tablet (75 mg total) by mouth 2 (two) times daily. 03/13/19   Sanjuana Kava, MD  escitalopram (LEXAPRO) 20 MG tablet Take 1 tablet (20 mg total) by mouth daily. 03/14/19 03/13/20  Cloria Spring, MD  fluticasone (FLONASE) 50 MCG/ACT nasal spray Place 2 sprays into the nose daily as needed for allergies.     [provider]  gabapentin (NEURONTIN) 100 MG capsule TK ONE C PO QHS 01/28/18   [provider]  linaclotide (LINZESS) 290 MCG CAPS capsule Take 1 capsule (290 mcg total) by mouth daily. Patient taking differently: Take 290 mcg by mouth daily as needed (constipation).  10/08/15   Mahala Menghini, PA-C  lisinopril (PRINIVIL,ZESTRIL) 5 MG tablet  12/22/16   [provider]  loratadine (CLARITIN) 10 MG tablet Take 10 mg by mouth daily.    [provider]  meclizine (ANTIVERT) 25 MG tablet Take 25 mg by mouth daily as needed for dizziness.  10/17/14   [provider]  meloxicam (MOBIC) 15 MG tablet Take 1 tablet (15 mg total) by mouth daily. 02/22/17    Sanjuana Kava, MD  metFORMIN (GLUCOPHAGE) 500 MG tablet Take 500 mg by mouth 2 (two) times daily.  09/09/16   [provider]  methocarbamol (ROBAXIN) 500 MG tablet Take 500 mg by mouth 3 (three) times daily as needed for muscle spasms.     [provider]  Multiple Vitamin (MULTIVITAMIN PO) Take 1 tablet by mouth daily.     [provider]  ondansetron (ZOFRAN ODT) 4 MG disintegrating tablet Take 1 tablet (4 mg total) by mouth every 8 (eight) hours as needed for nausea or vomiting. 11/21/16   Nat Christen, MD  oxyCODONE-acetaminophen (PERCOCET/ROXICET) 5-325 MG tablet Take 1 tablet by mouth every 6 (six) hours as needed for moderate pain or severe pain (Must last 30 days.). 03/13/19   Sanjuana Kava, MD  pantoprazole (PROTONIX) 40 MG tablet Take 1 tablet (40 mg total) by mouth 2 (two) times daily. 10/08/15   Mahala Menghini, PA-C  polyethylene glycol (MIRALAX / GLYCOLAX) packet Take 17 g by mouth daily.     [provider]  Propylene Glycol-Glycerin (SOOTHE) 0.6-0.6 % SOLN Place 1 drop into both eyes 2 (two) times daily.    [provider]  Psyllium (METAMUCIL PO) Take 1 each by mouth 2 (two) times daily.     [provider]  rizatriptan (MAXALT-MLT) 10 MG disintegrating tablet Take 10 mg by mouth as needed for migraine. May repeat in 2 hours if needed    [provider]  temazepam (RESTORIL) 30 MG capsule Take 1 capsule (30 mg total) by mouth at bedtime as needed for sleep. 03/14/19   Cloria Spring, MD  terconazole (TERAZOL 7) 0.4 % vaginal cream Place 1 applicator vaginally at bedtime. 03/09/19   Florian Buff, MD    Family History Family History  Problem Relation Age of Onset  . Ovarian cancer Mother   . Heart disease Mother   . Anxiety disorder Mother   . Cirrhosis Father        deceased age 29  . Alcohol abuse Father   . Liver cancer Cousin  age 34, deceased  . Drug abuse Cousin   . ADD / ADHD Son   . Anxiety disorder  Sister   . Dementia Maternal Grandfather   . Colon cancer Other        aunt, deceased age 41  . Breast cancer Other        aunt, deceased age 34  . Bipolar disorder Neg Hx   . Depression Neg Hx   . OCD Neg Hx   . Paranoid behavior Neg Hx   . Schizophrenia Neg Hx   . Seizures Neg Hx   . Sexual abuse Neg Hx   . Physical abuse Neg Hx     Social History Social History   Tobacco Use  . Smoking status: Never Smoker  . Smokeless tobacco: Never Used  Substance Use Topics  . Alcohol use: Yes    Comment: occasionally wine  . Drug use: No     Allergies   Bactrim and Neurontin [gabapentin]   Review of Systems Review of Systems  Constitutional: Negative for fever.  HENT: Negative for dental problem, facial swelling and trouble swallowing.   Respiratory: Negative for shortness of breath.   Cardiovascular: Positive for chest pain. Negative for leg swelling.  Gastrointestinal: Positive for nausea. Negative for abdominal pain, diarrhea and vomiting.  Genitourinary: Negative for dysuria and hematuria.  Neurological: Negative for weakness and numbness.  All other systems reviewed and are negative.    Physical Exam Updated Vital Signs BP 119/66 (BP Location: Right Arm)   Pulse 70   Temp 98.1 F (36.7 C) (Oral)   Resp 18   Ht 5\' 6"  (1.676 m)   Wt 131.5 kg   SpO2 99%   BMI 46.81 kg/m   Physical Exam Vitals signs and nursing note reviewed.  Constitutional:      Appearance: Normal appearance. She is well-developed.     Comments: Sitting comfortably on examination table  HENT:     Head: Normocephalic and atraumatic.  Eyes:     General: Lids are normal.     Conjunctiva/sclera: Conjunctivae normal.     Pupils: Pupils are equal, round, and reactive to light.  Neck:     Musculoskeletal: Full passive range of motion without pain.  Cardiovascular:     Rate and Rhythm: Normal rate and regular rhythm.     Pulses: Normal pulses.          Radial pulses are 2+ on the right side  and 2+ on the left side.       Dorsalis pedis pulses are 2+ on the right side and 2+ on the left side.     Heart sounds: Normal heart sounds. No murmur. No friction rub. No gallop.   Pulmonary:     Effort: Pulmonary effort is normal.     Breath sounds: Normal breath sounds.     Comments: Lungs clear to auscultation bilaterally.  Symmetric chest rise.  No wheezing, rales, rhonchi. Chest:     Comments: Pain reproduced with palpation of chest. Abdominal:     Palpations: Abdomen is soft. Abdomen is not rigid.     Tenderness: There is no abdominal tenderness. There is no right CVA tenderness, left CVA tenderness or guarding.     Comments: Abdomen is soft, nondistended.  Generalized mild tenderness noted diffusely with no focal point.  No CVA tenderness noted bilaterally.  No rigidity, guarding.  Musculoskeletal: Normal range of motion.  Skin:    General: Skin is warm and dry.  Capillary Refill: Capillary refill takes less than 2 seconds.  Neurological:     Mental Status: She is alert and oriented to person, place, and time.  Psychiatric:        Speech: Speech normal.      ED Treatments / Results  Labs (all labs ordered are listed, but only abnormal results are displayed) Labs Reviewed  BASIC METABOLIC PANEL - Abnormal; Notable for the following components:      Result Value   Glucose, Bld 147 (*)    BUN 22 (*)    All other components within normal limits  CBC - Abnormal; Notable for the following components:   RBC 5.50 (*)    Hemoglobin 15.5 (*)    HCT 49.1 (*)    All other components within normal limits  HEPATIC FUNCTION PANEL  TROPONIN I (HIGH SENSITIVITY)  TROPONIN I (HIGH SENSITIVITY)    EKG EKG Interpretation  Date/Time:  Monday April 02 2019 11:04:17 EST Ventricular Rate:  68 PR Interval:  166 QRS Duration: 94 QT Interval:  390 QTC Calculation: 414 R Axis:   12 Text Interpretation: Normal sinus rhythm with sinus arrhythmia Incomplete right bundle branch  block Borderline ECG Confirmed by Milton Ferguson [phone]) on 04/02/2019 5:12:39 PM   Radiology Dg Chest 2 View  Result Date: 04/02/2019 CLINICAL DATA:  58 year old female with chest pain dizziness and fatigue. EXAM: CHEST - 2 VIEW COMPARISON:  Chest radiographs 11/21/2016 and earlier. FINDINGS: Chronically somewhat low lung volumes are stable. Mediastinal contours remain normal. Visualized tracheal air column is within normal limits. The lungs appear stable and clear. No pneumothorax or pleural effusion. No acute osseous abnormality identified. Negative visible bowel gas pattern. IMPRESSION: No cardiopulmonary abnormality identified. Electronically Signed   By: Genevie Ann M.D.   On: 04/02/2019 11:31    Procedures Procedures (including critical care time)  Medications Ordered in ED Medications  sodium chloride flush (NS) 0.9 % injection 3 mL (3 mLs Intravenous Given 04/02/19 1504)  ondansetron (ZOFRAN) injection 4 mg (4 mg Intravenous Given 04/02/19 1504)  morphine 4 MG/ML injection 4 mg (4 mg Intravenous Given 04/02/19 1504)  morphine 2 MG/ML injection 2 mg (2 mg Intravenous Given 04/02/19 1706)     Initial Impression / Assessment and Plan / ED Course  I have reviewed the triage vital signs and the nursing notes.  Pertinent labs & imaging results that were available during my care of the patient were reviewed by me and considered in my medical decision making (see chart for details).     HEAR Score: 73  58 year old female who presents for evaluation of chest pain that has been constant for the last 3 weeks.  It began after she took some CBD oil to help with her knee arthritis.  Since then reports she has had constant chest pain for 3 weeks.  Describes pressure across her chest.  On initial arrival, she is afebrile, nontoxic appearing.  Vitals appear stable.  Is reproduced with palpation of the chest.  Low suspicion for ACS etiology but is a consideration.  Her story sounds atypical.   Additionally, history/physical exam is not concerning for PE.  She has no shortness of breath, no tachycardia no hypoxia.  Low suspicion for infectious etiology but also consideration.  History/physical exam not concerning for dissection, PE.  She is not hypoxic or tachycardic.  Patient was not complaining of any abdominal pain over the last 3 weeks.  During my evaluation, she has mild generalized tenderness noted on exam.  Doubt infectious intra-abdominal process but will add LFTs for evaluation.  Patient given pain medication.  Chest x-ray negative for any acute abnormalities.  BMP is unremarkable.  CBC without any significant leukocytosis or anemia.  Initial troponin is negative.  LFTs are within normal limits.  Chest x-ray negative for any acute infectious etiology.  Given that patient has risk factors, she has a heart score of 3.    Delta troponin negative.  Reevaluation of patient.  Patient reports improvement in pain.  Repeat abdominal exam shows no tenderness.  I discussed with patient and engaged in shared decision making.  I did offer a CT scan to see if this was diverticulitis that caused her to have some abdominal tenderness.  Patient states she does not have any abdominal pain at this time and I agreed with her that her abdomen exam is benign.  He does not wish to pursue a CT scan of her abdomen pelvis at this time.  I feel this is reasonable given reassuring exam.  Additionally, given that she has 2 - troponins after 3 weeks of constant chest pain, do not feel that this is representative of acute coronary syndrome.  I feel that this may be related to stress and anxiety that she is dealing with at home.  At this time, given 2 - troponins and reassuring exam, I feel that she is recently discharged. At this time, patient exhibits no emergent life-threatening condition that require further evaluation in ED or admission. Discussed patient with Dr. Lacinda Axon. Patient had ample opportunity for questions and  discussion. All patient's questions were answered with full understanding. Strict return precautions discussed. Patient expresses understanding and agreement to plan.   Portions of this note were generated with [employer]. Dictation errors may occur despite best attempts at proofreading.   Final Clinical Impressions(s) / ED Diagnoses   Final diagnoses:  Nonspecific chest pain    ED Discharge Orders    None       Desma Mcgregor 04/02/19 2131    Nat Christen, MD 04/03/19 [phone]

## 2019-04-02 NOTE — Telephone Encounter (Signed)
Returned call from pt. Referred her to www.eatright.org and www.diabetes.org websites for nutrition and carb counting information until her appt with RDN appt 04-20-19 in [place].Marland Kitchen She verbalized understanding. She noted she was having issues of heart palpitations, dizziness and nausea. Advised to contact PCP regarding symptoms. Denies BS less than 190 mg/dl. Also advised to take her Metformin 1000 mg BID AFTER breakfast instead of before and before bed. She verbalized she had been taking it before breakfast. Is also taking Byetta for her DM Type 2.. Since she lives in [place], she requested follow up appt in [place] after initial appt in [place].

## 2019-04-02 NOTE — Discharge Instructions (Signed)
As we discussed, you need to follow-up with your primary care doctor.  I additionally provided outpatient referral to cardiology if needed so.  Return to the Emergency Department immediately if you experiencing worsening chest pain, difficulty breathing, nausea/vomiting, get very sweaty, headache or any other worsening or concerning symptoms.

## 2019-04-02 NOTE — ED Triage Notes (Signed)
Patient complaining of chest pain x 3 weeks, worsening x 3 days. States she was seen by PCP and sent here today due to worsening symptoms. Also complaining of fatigue and dizziness.

## 2019-04-11 ENCOUNTER — Telehealth (HOSPITAL_COMMUNITY): Payer: Self-pay | Admitting: *Deleted

## 2019-04-11 NOTE — Telephone Encounter (Signed)
PATIENT INFORMED HER PCP WHICH YOU WILL BE SEEING THIS WEEK IS ALSO GOING TO WRITE A LETTER FOR HER PHYSICAL STATUS. BUT I WILL F/U

## 2019-04-11 NOTE — Telephone Encounter (Signed)
ok 

## 2019-04-11 NOTE — Telephone Encounter (Signed)
Patient called stating that she needs a medical release for her Mental status to have weight loss surgery

## 2019-04-11 NOTE — Telephone Encounter (Signed)
Usually the bariatric clinic sends a form to fill out or a formal request

## 2019-04-16 LAB — LIPID PANEL
Cholesterol: 176 (ref 0–200)
HDL: 44 (ref 35–70)
LDL Cholesterol: 91
Triglycerides: 243 — AB (ref 40–160)

## 2019-04-16 LAB — TSH: TSH: 2.01 (ref 0.41–5.90)

## 2019-04-16 LAB — HEMOGLOBIN A1C: Hemoglobin A1C: 8.9

## 2019-04-17 LAB — BASIC METABOLIC PANEL
BUN: 19 (ref 4–21)
Creatinine: 0.7 (ref 0.5–1.1)

## 2019-04-20 ENCOUNTER — Encounter: Payer: Self-pay | Admitting: Registered"

## 2019-04-20 ENCOUNTER — Other Ambulatory Visit: Payer: Self-pay

## 2019-04-20 ENCOUNTER — Encounter: Payer: Medicare Other | Attending: Physician Assistant | Admitting: Registered"

## 2019-04-20 DIAGNOSIS — E119 Type 2 diabetes mellitus without complications: Secondary | ICD-10-CM | POA: Diagnosis not present

## 2019-04-20 NOTE — Patient Instructions (Signed)
Goals:  Follow Diabetes Meal Plan as instructed  Eat 3 meals and 2 snacks, every 3-5 hrs  Limit carbohydrate intake to 30-45 grams carbohydrate/meal  Remember to make 1/2 plate of non-starchy vegetables  Limit carbohydrate intake to 15-30 grams carbohydrate/snack  Add lean protein foods to meals/snacks  Monitor glucose levels as instructed by your doctor  Aim for 30 mins of physical activity daily  Bring food record and glucose log to your next nutrition visit

## 2019-04-20 NOTE — Progress Notes (Addendum)
Diabetes Self-Management Education  Visit Type:  First/Initial  Appt. Start Time: 8:03 Appt. End Time: 9:27  04/20/2019  [name], identified by name and date of birth, is a 58 y.o. female with a diagnosis of Diabetes: Type 2.   ASSESSMENT  Pt states she checks BS 3x/day: FBS (190) and after meals (250-270). Reports experiencing dizziness, severe fatigue, headaches, and hunger at times. States stress is likely contributing to increased BS numbers.   Reports history of dieting: counting calories, MyFitnessPal, Weight Watchers, keto, and lapband procedure. States she is planning to have lap band removed and converted to sleeve gastrectomy . Reports chronic constipation due to having 12 feet of small colon removed.   States she has been trying to eat more organic foods and loves seafood.    There were no vitals taken for this visit. There is no height or weight on file to calculate BMI.   Diabetes Self-Management Education - 04/20/19 0816      Initial Visit   What type of meal plan do you follow?  Keto diet      Health Coping   How would you rate your overall health?  Very Poor      Psychosocial Assessment   Patient Belief/Attitude about Diabetes  Defeat/Burnout    Self-care barriers  None    Self-management support  Family;Friends    Patient Concerns  Nutrition/Meal planning    Special Needs  None    Preferred Learning Style  No preference indicated    Learning Readiness  Change in progress      Complications   Last HgB A1C per patient/outside source  8.9 %    How often do you check your blood sugar?  3-4 times/day    Fasting Blood glucose range (mg/dL)  180-200    Postprandial Blood glucose range (mg/dL)  >200    Number of hypoglycemic episodes per month  0    Number of hyperglycemic episodes per week  7    Can you tell when your blood sugar is high?  Yes    What do you do if your blood sugar is high?  will lie down and do breathing exercsies, remain calm,  will give insulin    Have you had a dilated eye exam in the past 12 months?  Yes    Have you had a dental exam in the past 12 months?  Yes    Are you checking your feet?  Yes    How many days per week are you checking your feet?  1      Dietary Intake   Breakfast  oatmeal + cinnamon + ground flaxseed + 1/2 banana + unsweetened almond milk    Snack (morning)  cheese wisp    Lunch  broiled flounder + cole slaw (with avocado mayo) + green beans    Snack (afternoon)  Belvita snack pack    Dinner  beef hot dog + saeurkraut + relish + wheat bun    Snack (evening)  skinny popcorn    Beverage(s)  water, green tea, black decaf coffee      Exercise   Exercise Type  Moderate (swimming / aerobic walking)    How many days per week to you exercise?  3    How many minutes per day do you exercise?  45    Total minutes per week of exercise  135      Patient Education   Previous Diabetes Education  No    Disease  state   Definition of diabetes, type 1 and 2, and the diagnosis of diabetes;Factors that contribute to the development of diabetes    Nutrition management   Food label reading, portion sizes and measuring food.;Role of diet in the treatment of diabetes and the relationship between the three main macronutrients and blood glucose level;Reviewed blood glucose goals for pre and post meals and how to evaluate the patients' food intake on their blood glucose level.    Physical activity and exercise   Role of exercise on diabetes management, blood pressure control and cardiac health.;Helped patient identify appropriate exercises in relation to his/her diabetes, diabetes complications and other health issue.    Medications  Reviewed patients medication for diabetes, action, purpose, timing of dose and side effects.    Monitoring  Purpose and frequency of SMBG.;Taught/discussed recording of test results and interpretation of SMBG.;Identified appropriate SMBG and/or A1C goals.;Daily foot exams;Yearly dilated  eye exam;Interpreting lab values - A1C, lipid, urine microalbumina.    Acute complications  Taught treatment of hypoglycemia - the 15 rule.;Discussed and identified patients' treatment of hyperglycemia.    Chronic complications  Relationship between chronic complications and blood glucose control;Assessed and discussed foot care and prevention of foot problems;Lipid levels, blood glucose control and heart disease;Identified and discussed with patient  current chronic complications;Retinopathy and reason for yearly dilated eye exams;Nephropathy, what it is, prevention of, the use of ACE, ARB's and early detection of through urine microalbumia.;Reviewed with patient heart disease, higher risk of, and prevention    Psychosocial adjustment  Role of stress on diabetes;Identified and addressed patients feelings and concerns about diabetes      Individualized Goals (developed by patient)   Nutrition  Follow meal plan discussed;General guidelines for healthy choices and portions discussed    Physical Activity  Exercise 3-5 times per week;30 minutes per day    Medications  take my medication as prescribed    Monitoring   test my blood glucose as discussed    Reducing Risk  examine blood glucose patterns;do foot checks daily;treat hypoglycemia with 15 grams of carbs if blood glucose less than 70mg /dL;increase portions of nuts and seeds      Post-Education Assessment   Patient understands the diabetes disease and treatment process.  Demonstrates understanding / competency    Patient understands incorporating nutritional management into lifestyle.  Demonstrates understanding / competency    Patient undertands incorporating physical activity into lifestyle.  Demonstrates understanding / competency    Patient understands using medications safely.  Demonstrates understanding / competency    Patient understands monitoring blood glucose, interpreting and using results  Demonstrates understanding / competency     Patient understands prevention, detection, and treatment of acute complications.  Demonstrates understanding / competency    Patient understands prevention, detection, and treatment of chronic complications.  Demonstrates understanding / competency    Patient understands how to develop strategies to address psychosocial issues.  Demonstrates understanding / competency    Patient understands how to develop strategies to promote health/change behavior.  Demonstrates understanding / competency      Outcomes   Program Status  Completed       Learning Objective:  Patient will have a greater understanding of diabetes self-management. Patient education plan is to attend individual and/or group sessions per assessed needs and concerns.   Plan:   Patient Instructions  Goals:  Follow Diabetes Meal Plan as instructed  Eat 3 meals and 2 snacks, every 3-5 hrs  Limit carbohydrate intake to 30-45 grams carbohydrate/meal  Remember to make 1/2 plate of non-starchy vegetables  Limit carbohydrate intake to 15-30 grams carbohydrate/snack  Add lean protein foods to meals/snacks  Monitor glucose levels as instructed by your doctor  Aim for 30 mins of physical activity daily  Bring food record and glucose log to your next nutrition visit     Expected Outcomes:  Demonstrated interest in learning. Expect positive outcomes  Education material provided: ADA - How to Thrive: A Guide for Your Journey with Diabetes  If problems or questions, patient to contact team via:  Phone and Email  Future DSME appointment: - PRN

## 2019-04-24 ENCOUNTER — Ambulatory Visit: Payer: Medicare Other | Admitting: Orthopaedic Surgery

## 2019-05-01 ENCOUNTER — Other Ambulatory Visit: Payer: Self-pay

## 2019-05-01 ENCOUNTER — Encounter: Payer: Self-pay | Admitting: Orthopaedic Surgery

## 2019-05-01 ENCOUNTER — Ambulatory Visit (INDEPENDENT_AMBULATORY_CARE_PROVIDER_SITE_OTHER): Payer: Medicare Other | Admitting: Orthopaedic Surgery

## 2019-05-01 VITALS — Temp 96.6°F | Ht 66.0 in | Wt 298.0 lb

## 2019-05-01 DIAGNOSIS — M25561 Pain in right knee: Secondary | ICD-10-CM | POA: Diagnosis not present

## 2019-05-01 DIAGNOSIS — Z6841 Body Mass Index (BMI) 40.0 and over, adult: Secondary | ICD-10-CM | POA: Diagnosis not present

## 2019-05-01 DIAGNOSIS — G8929 Other chronic pain: Secondary | ICD-10-CM | POA: Diagnosis not present

## 2019-05-01 DIAGNOSIS — M25562 Pain in left knee: Secondary | ICD-10-CM | POA: Diagnosis not present

## 2019-05-01 MED ORDER — OXYCODONE-ACETAMINOPHEN 5-325 MG PO TABS: 1.0000 | ORAL_TABLET | Freq: Four times a day (QID) | ORAL | 0 refills | Status: DC | PRN

## 2019-05-01 NOTE — Progress Notes (Signed)
CC: Both of my knees are hurting. I would like an injection in both knees.  The patient has had chronic pain and tenderness of both knees for some time.  Injections help.  There is no locking or giving way of the knee.  There is no new trauma. There is no redness or signs of infections.  The knees have a mild effusion and some crepitus.  There is no redness or signs of recent trauma.  Right knee ROM is 0-95 and left knee ROM is [address].  Impression:  Chronic pain of the both knees  Return:  6 weeks  PROCEDURE NOTE:  The patient requests injections of both knees, verbal consent was obtained.  The left and right knee were individually prepped appropriately after time out was performed.   Sterile technique was observed and injection of 1 cc of Depo-Medrol 40 mg with several cc's of plain xylocaine. Anesthesia was provided by ethyl chloride and a 20-gauge needle was used to inject each knee area. The injections were tolerated well.  A band aid dressing was applied.  The patient was advised to apply ice later today and tomorrow to the injection sight as needed.   I have followed her for many years with bilateral knee pain degenerative joint disease beginning in 2007.  Her knees have gotten progressively worse.  She has diabetes and obesity.  Her weight today is 298 pounds, height is 5 foot 6 inches, BMI is 48.1.  She has been on multiple different types of weight reduction programs in the past including weight watches, pool exercises, keto diet, slim fast dies, nutri-system diet.  She did loose down to 224 once but has gained it all back plus more.   I feel she would be a candidate for weight reduction surgery because she has not had successful results with other types of diets and treatments.  She will need total knees but cannot have them because of her excess weight.  Her diabetes is not in good control because of the obesity.    Electronically Signed Sanjuana Kava, MD 12/22/202010:24  AM

## 2019-05-15 ENCOUNTER — Telehealth: Payer: Self-pay | Admitting: Obstetrics & Gynecology

## 2019-05-15 MED ORDER — ESTRADIOL 2 MG PO TABS: 2.0000 mg | ORAL_TABLET | Freq: Every day | ORAL | 11 refills | Status: DC

## 2019-05-15 NOTE — Telephone Encounter (Signed)
Pt states that she called yesterday needing medication for menopause. She states that she gets dizzy when she gets hot. She is also having nausea. Please advise. [facility] on [address]. Cell#: [phone]

## 2019-05-15 NOTE — Telephone Encounter (Signed)
Rx Estrace e prescribed

## 2019-05-21 ENCOUNTER — Ambulatory Visit (INDEPENDENT_AMBULATORY_CARE_PROVIDER_SITE_OTHER): Payer: Medicare Other | Admitting: "Endocrinology

## 2019-05-21 ENCOUNTER — Encounter: Payer: Self-pay | Admitting: "Endocrinology

## 2019-05-21 ENCOUNTER — Other Ambulatory Visit: Payer: Self-pay

## 2019-05-21 VITALS — BP 149/94 | HR 72 | Ht 66.0 in | Wt 297.4 lb

## 2019-05-21 DIAGNOSIS — I10 Essential (primary) hypertension: Secondary | ICD-10-CM

## 2019-05-21 DIAGNOSIS — E1165 Type 2 diabetes mellitus with hyperglycemia: Secondary | ICD-10-CM | POA: Diagnosis not present

## 2019-05-21 DIAGNOSIS — E782 Mixed hyperlipidemia: Secondary | ICD-10-CM | POA: Diagnosis not present

## 2019-05-21 MED ORDER — BD PEN NEEDLE SHORT U/F 31G X 8 MM MISC: 1.0000 | 3 refills | Status: DC

## 2019-05-21 MED ORDER — LANTUS SOLOSTAR 100 UNIT/ML ~~LOC~~ SOPN: 20.0000 [IU] | PEN_INJECTOR | Freq: Every day | SUBCUTANEOUS | 2 refills | Status: DC

## 2019-05-21 MED ORDER — TOUJEO MAX SOLOSTAR 300 UNIT/ML ~~LOC~~ SOPN: 20.0000 [IU] | PEN_INJECTOR | Freq: Every day | SUBCUTANEOUS | 2 refills | Status: DC

## 2019-05-21 NOTE — Patient Instructions (Signed)

## 2019-05-21 NOTE — Progress Notes (Signed)
Endocrinology Consult Note       05/21/2019, 10:51 PM   Subjective:    Patient ID: [name], female    DOB: 02/18/1961.  [name] is being seen in consultation for management of currently uncontrolled symptomatic diabetes requested by  Sharilyn Sites, MD.   Past Medical History:  Diagnosis Date  . Adenomatous polyp 2009  . Anemia 2003 after surgery needed 1 unit blood  . Anxiety   . Arthritis   . Bipolar 1 disorder ([place])   . Constipation   . Depression   . Diabetes mellitus   . Diabetes mellitus, type II ([place])   . Diverticula of colon 2009  . Generalized headaches   . GERD (gastroesophageal reflux disease)   . History of hiatal hernia    small  . Hypertension   . Migraine   . Obesity   . Pelvic floor dysfunction    abnormal anorectal manometry at [facility] in 2009  . Plantar fasciitis both feet  . Psoriasis   . Sleep apnea    cpap setting of 3.5    Past Surgical History:  Procedure Laterality Date  . ABDOMINAL HYSTERECTOMY     compete  . CATARACT EXTRACTION W/PHACO Right 02/15/2017   Procedure: CATARACT EXTRACTION PHACO AND INTRAOCULAR LENS PLACEMENT (IOC);  Surgeon: Rutherford Guys, MD;  Location: [facility];  Service: Ophthalmology;  Laterality: Right;  CDE: 4.18  . CATARACT EXTRACTION W/PHACO Left 03/01/2017   Procedure: CATARACT EXTRACTION PHACO AND INTRAOCULAR LENS PLACEMENT (IOC);  Surgeon: Rutherford Guys, MD;  Location: [facility];  Service: Ophthalmology;  Laterality: Left;  CDE: 2.56  . COLON RESECTION    03/30/2002   with end-colostomy and Hartmann's pouch  . COLON SURGERY     complicated diverticulitis requiring sigmoid resection with colostomy and subsequent takedown  . COLONOSCOPY  12/11/2007   Dr. Gala Romney- marginal prep, normal rectum pancolonic diverticula, adenomatous polyp  . COLONOSCOPY  08/28/2003      Wide open colonic anastomosis/Scattered diverticula noted throughout  colon/ Small external hemorrhoids  . COLONOSCOPY  04/2012   [facility]: hyperplastic polyps, diverticulosis, ileocolonic anastomosis.  . COLONOSCOPY WITH PROPOFOL N/A 06/07/2016   Procedure: COLONOSCOPY WITH PROPOFOL;  Surgeon: Daneil Dolin, MD;  Location: [facility];  Service: Endoscopy;  Laterality: N/A;  7:30 am  . COLOSTOMY CLOSURE  07/10/2002  . ESOPHAGOGASTRODUODENOSCOPY (EGD) WITH PROPOFOL N/A 08/23/2016   Procedure: ESOPHAGOGASTRODUODENOSCOPY (EGD) WITH PROPOFOL;  Surgeon: Alphonsa Overall, MD;  Location: [facility];  Service: General;  Laterality: N/A;  . FLEXIBLE SIGMOIDOSCOPY  01/20/2012   [facility]: incomplete/attempted colonoscopy. Inadequate prep precluded examination  . HEEL SPUR SURGERY Left 09/11/2013   both have been done  . KNEE ARTHROSCOPY  02/04/2004    left knee/partial medial meniscectomy.  Marland Kitchen KNEE SURGERY     3 arthroscopic  2 on left 1 on right  . LAPAROSCOPIC GASTRIC BANDING  2009  . LAPAROSCOPIC SALPINGOOPHERECTOMY  03/30/2002  . TUBAL LIGATION      Social History   Socioeconomic History  . Marital status: Married    Spouse name: Not on file  . Number of children: 2  .  Years of education: college  . Highest education level: Not on file  Occupational History  . 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 Smoking status: Never Smoker  . Smokeless tobacco: Never Used  Substance and Sexual Activity  . Alcohol use: Yes    Comment: occasionally wine  . Drug use: No  . Sexual activity: Not Currently    Birth control/protection: Surgical  Other Topics Concern  . Not on file  Social History Narrative  . Not on file   Social Determinants of Health   Financial Resource Strain:   . Difficulty of Paying Living Expenses: Not on file  Food Insecurity:   . Worried About Charity fundraiser in the Last Year: Not on file  . Ran Out of Food in the Last Year: Not on file  Transportation Needs:   . Lack of Transportation  (Medical): Not on file  . Lack of Transportation (Non-Medical): Not on file  Physical Activity:   . Days of Exercise per Week: Not on file  . Minutes of Exercise per Session: Not on file  Stress:   . Feeling of Stress : Not on file  Social Connections:   . Frequency of Communication with Friends and Family: Not on file  . Frequency of Social Gatherings with Friends and Family: Not on file  . Attends Religious Services: Not on file  . Active Member of Clubs or Organizations: Not on file  . Attends Archivist Meetings: Not on file  . Marital Status: Not on file    Family History  Problem Relation Age of Onset  . Ovarian cancer Mother   . Heart disease Mother   . Anxiety disorder Mother   . Cirrhosis Father        deceased age 78  . Alcohol abuse Father   . Liver cancer Cousin        age 49, deceased  . Drug abuse Cousin   . ADD / ADHD Son   . Anxiety disorder Sister   . Dementia Maternal Grandfather   . Colon cancer Other        aunt, deceased age 9  . Breast cancer Other        aunt, deceased age 21  . Bipolar disorder Neg Hx   . Depression Neg Hx   . OCD Neg Hx   . Paranoid behavior Neg Hx   . Schizophrenia Neg Hx   . Seizures Neg Hx   . Sexual abuse Neg Hx   . Physical abuse Neg Hx     Outpatient Encounter Medications as of 05/21/2019  Medication Sig  . ALPRAZolam (XANAX) 1 MG tablet Take 1 tablet (1 mg total) by mouth 3 (three) times daily as needed for anxiety.  . ARIPiprazole (ABILIFY) 10 MG tablet Take 1 tablet (10 mg total) by mouth at bedtime.  . cycloSPORINE (RESTASIS) 0.05 % ophthalmic emulsion Place 2 drops into both eyes 2 (two) times daily.   . diclofenac (VOLTAREN) 75 MG EC tablet Take 1 tablet (75 mg total) by mouth 2 (two) times daily.  Marland Kitchen escitalopram (LEXAPRO) 20 MG tablet Take 1 tablet (20 mg total) by mouth daily.  Marland Kitchen estradiol (ESTRACE) 2 MG tablet Take 1 tablet (2 mg total) by mouth daily.  Marland Kitchen exenatide (BYETTA) 5 MCG/0.02ML SOPN  injection Inject 5 mcg into the skin 2 (two) times daily with a meal.  . fluticasone (FLONASE) 50 MCG/ACT nasal  spray Place 2 sprays into the nose daily as needed for allergies.   Marland Kitchen gabapentin (NEURONTIN) 100 MG capsule TK ONE C PO QHS  . Insulin Glargine (LANTUS SOLOSTAR) 100 UNIT/ML Solostar Pen Inject 20 Units into the skin at bedtime.  . Insulin Pen Needle (B-D ULTRAFINE III SHORT PEN) 31G X 8 MM MISC 1 each by Does not apply route as directed.  . linaclotide (LINZESS) 290 MCG CAPS capsule Take 1 capsule (290 mcg total) by mouth daily. (Patient taking differently: Take 290 mcg by mouth daily as needed (constipation). )  . lisinopril (PRINIVIL,ZESTRIL) 5 MG tablet   . loratadine (CLARITIN) 10 MG tablet Take 10 mg by mouth daily.  . meclizine (ANTIVERT) 25 MG tablet Take 25 mg by mouth daily as needed for dizziness.   . meloxicam (MOBIC) 15 MG tablet Take 1 tablet (15 mg total) by mouth daily.  . metFORMIN (GLUCOPHAGE) 500 MG tablet Take 500 mg by mouth 2 (two) times daily.   . methocarbamol (ROBAXIN) 500 MG tablet Take 500 mg by mouth 3 (three) times daily as needed for muscle spasms.   . Multiple Vitamin (MULTIVITAMIN PO) Take 1 tablet by mouth daily.   . ondansetron (ZOFRAN ODT) 4 MG disintegrating tablet Take 1 tablet (4 mg total) by mouth every 8 (eight) hours as needed for nausea or vomiting.  Marland Kitchen oxyCODONE-acetaminophen (PERCOCET/ROXICET) 5-325 MG tablet Take 1 tablet by mouth every 6 (six) hours as needed for moderate pain or severe pain (Must last 30 days.).  Marland Kitchen pantoprazole (PROTONIX) 40 MG tablet Take 1 tablet (40 mg total) by mouth 2 (two) times daily.  . polyethylene glycol (MIRALAX / GLYCOLAX) packet Take 17 g by mouth daily.   Marland Kitchen Propylene Glycol-Glycerin (SOOTHE) 0.6-0.6 % SOLN Place 1 drop into both eyes 2 (two) times daily.  . Psyllium (METAMUCIL PO) Take 1 each by mouth 2 (two) times daily.   . rizatriptan (MAXALT-MLT) 10 MG disintegrating tablet Take 10 mg by mouth as needed for  migraine. May repeat in 2 hours if needed  . temazepam (RESTORIL) 30 MG capsule Take 1 capsule (30 mg total) by mouth at bedtime as needed for sleep.  Marland Kitchen terconazole (TERAZOL 7) 0.4 % vaginal cream Place 1 applicator vaginally at bedtime.  . [DISCONTINUED] Insulin Glargine, 2 Unit Dial, (TOUJEO MAX SOLOSTAR) 300 UNIT/ML SOPN Inject 20 Units into the skin at bedtime.   No facility-administered encounter medications on file as of 05/21/2019.    ALLERGIES: Allergies  Allergen Reactions  . Bactrim Itching, Nausea And Vomiting and Other (See Comments)    Redness    VACCINATION STATUS:  There is no immunization history on file for this patient.  Diabetes She presents for her initial diabetic visit. She has type 2 diabetes mellitus. Onset time: She was diagnosed at approximate age of 37 years. Her disease course has been worsening. There are no hypoglycemic associated symptoms. Pertinent negatives for hypoglycemia include no confusion, headaches, pallor or seizures. Associated symptoms include fatigue, polydipsia and polyuria. Pertinent negatives for diabetes include no chest pain and no polyphagia. There are no hypoglycemic complications. Symptoms are worsening. Diabetic complications include heart disease. Risk factors for coronary artery disease include dyslipidemia, family history, diabetes mellitus, hypertension, obesity, sedentary lifestyle and post-menopausal. Current diabetic treatments: She is currently on Byetta 5 mg twice daily, Metformin 1000 mg p.o. twice daily. Her weight is increasing steadily (She has previous history of lap band, recently increasing her weight to presurgical levels of 300 pounds.). She is following  a generally unhealthy diet. When asked about meal planning, she reported none. She has not had a previous visit with a dietitian. She never participates in exercise. (Her most recent A1c was increasing to 8.9%.  She brought a log showing blood glucose profile ranging from 200  to 250 mg/dl. ) An ACE inhibitor/angiotensin II receptor blocker is being taken. Eye exam is current.  Hypertension This is a chronic problem. The current episode started more than 1 year ago. The problem is uncontrolled. Pertinent negatives include no chest pain, headaches, palpitations or shortness of breath. Risk factors for coronary artery disease include diabetes mellitus, obesity, sedentary lifestyle and post-menopausal state. Past treatments include ACE inhibitors.     Review of Systems  Constitutional: Positive for fatigue. Negative for chills, fever and unexpected weight change.  HENT: Negative for trouble swallowing and voice change.   Eyes: Negative for visual disturbance.  Respiratory: Negative for cough, shortness of breath and wheezing.   Cardiovascular: Negative for chest pain, palpitations and leg swelling.  Gastrointestinal: Negative for diarrhea, nausea and vomiting.  Endocrine: Positive for polydipsia and polyuria. Negative for cold intolerance, heat intolerance and polyphagia.  Musculoskeletal: Negative for arthralgias and myalgias.  Skin: Negative for color change, pallor, rash and wound.  Neurological: Negative for seizures and headaches.  Psychiatric/Behavioral: Negative for confusion and suicidal ideas.    Objective:    BP (!) 149/94   Pulse 72   Ht 5\' 6"  (1.676 m)   Wt 297 lb 6.4 oz (134.9 kg)   BMI 48.00 kg/m   Wt Readings from Last 3 Encounters:  05/21/19 297 lb 6.4 oz (134.9 kg)  05/01/19 298 lb (135.2 kg)  04/02/19 290 lb (131.5 kg)     Physical Exam Constitutional:      Appearance: She is well-developed.  HENT:     Head: Normocephalic and atraumatic.  Neck:     Thyroid: No thyromegaly.     Trachea: No tracheal deviation.  Cardiovascular:     Rate and Rhythm: Normal rate and regular rhythm.  Pulmonary:     Effort: Pulmonary effort is normal.     Breath sounds: Normal breath sounds.  Abdominal:     Tenderness: There is no abdominal  tenderness. There is no guarding.  Musculoskeletal:        General: Normal range of motion.     Cervical back: Normal range of motion and neck supple.  Skin:    General: Skin is warm and dry.     Coloration: Skin is not pale.     Findings: No erythema or rash.  Neurological:     Mental Status: She is alert and oriented to person, place, and time.     Cranial Nerves: No cranial nerve deficit.     Coordination: Coordination normal.     Deep Tendon Reflexes: Reflexes are normal and symmetric.  Psychiatric:        Judgment: Judgment normal.     CMP ( most recent) CMP     Component Value Date/Time   NA 138 04/02/2019 1152   K 4.1 04/02/2019 1152   CL 102 04/02/2019 1152   CO2 24 04/02/2019 1152   GLUCOSE 147 (H) 04/02/2019 1152   BUN 19 04/16/2019 0000   CREATININE 0.7 04/16/2019 0000   CREATININE 0.60 04/02/2019 1152   CREATININE 0.74 03/23/2011 0400   CALCIUM 9.3 04/02/2019 1152   PROT 8.0 04/02/2019 1351   ALBUMIN 4.1 04/02/2019 1351   AST 31 04/02/2019 1351   ALT 39  04/02/2019 1351   ALKPHOS 89 04/02/2019 1351   BILITOT 0.6 04/02/2019 1351   GFRNONAA >60 04/02/2019 1152   GFRAA >60 04/02/2019 1152   Recent Results (from the past 2160 hour(s))  Basic metabolic panel     Status: Abnormal   Collection Time: 04/02/19 11:52 AM  Result Value Ref Range   Sodium 138 135 - 145 mmol/L   Potassium 4.1 3.5 - 5.1 mmol/L   Chloride 102 98 - 111 mmol/L   CO2 24 22 - 32 mmol/L   Glucose, Bld 147 (H) 70 - 99 mg/dL   BUN 22 (H) 6 - 20 mg/dL   Creatinine, Ser 0.60 0.44 - 1.00 mg/dL   Calcium 9.3 8.9 - 10.3 mg/dL   GFR calc non Af Amer >60 >60 mL/min   GFR calc Af Amer >60 >60 mL/min   Anion gap 12 5 - 15    Comment: Performed at [facility], [address], [place], [place] [address]  CBC     Status: Abnormal   Collection Time: 04/02/19 11:52 AM  Result Value Ref Range   WBC 7.4 4.0 - 10.5 K/uL   RBC 5.50 (H) 3.87 - 5.11 MIL/uL   Hemoglobin 15.5 (H) 12.0 - 15.0 g/dL   HCT  49.1 (H) 36.0 - 46.0 %   MCV 89.3 80.0 - 100.0 fL   MCH 28.2 26.0 - 34.0 pg   MCHC 31.6 30.0 - 36.0 g/dL   RDW 12.9 11.5 - 15.5 %   Platelets 225 150 - 400 K/uL   nRBC 0.0 0.0 - 0.2 %    Comment: Performed at [facility], [address] [place], [place] [address]  Troponin I (High Sensitivity)     Status: None   Collection Time: 04/02/19 11:52 AM  Result Value Ref Range   Troponin I (High Sensitivity) 3 <18 ng/L    Comment: (NOTE) Elevated high sensitivity troponin I (hsTnI) values and significant  changes across serial measurements may suggest ACS but many other  chronic and acute conditions are known to elevate hsTnI results.  Refer to the "Links" section for chest pain algorithms and additional  guidance. Performed at [facility], [address] [place], [place] [address]   Troponin I (High Sensitivity)     Status: None   Collection Time: 04/02/19  1:51 PM  Result Value Ref Range   Troponin I (High Sensitivity) 3 <18 ng/L    Comment: (NOTE) Elevated high sensitivity troponin I (hsTnI) values and significant  changes across serial measurements may suggest ACS but many other  chronic and acute conditions are known to elevate hsTnI results.  Refer to the "Links" section for chest pain algorithms and additional  guidance. Performed at [facility], [address] [place], [place] [address]   Hepatic function panel     Status: None   Collection Time: 04/02/19  1:51 PM  Result Value Ref Range   Total Protein 8.0 6.5 - 8.1 g/dL   Albumin 4.1 3.5 - 5.0 g/dL   AST 31 15 - 41 U/L   ALT 39 0 - 44 U/L   Alkaline Phosphatase 89 38 - 126 U/L   Total Bilirubin 0.6 0.3 - 1.2 mg/dL   Bilirubin, Direct 0.1 0.0 - 0.2 mg/dL   Indirect Bilirubin 0.5 0.3 - 0.9 mg/dL    Comment: Performed at [facility], [address], [place],  [address]  Hemoglobin A1c     Status: None   Collection Time: 04/16/19 12:00 AM  Result  Value Ref Range   Hemoglobin A1C 8.9   Basic metabolic panel      Status: None   Collection Time: 04/16/19 12:00 AM  Result Value Ref Range   BUN 19 4 - 21   Creatinine 0.7 0.5 - 1.1  Lipid panel     Status: Abnormal   Collection Time: 04/16/19 12:00 AM  Result Value Ref Range   Triglycerides 243 (A) 40 - 160   Cholesterol 176 0 - 200   HDL 44 35 - 70   LDL Cholesterol 91   TSH     Status: None   Collection Time: 04/16/19 12:00 AM  Result Value Ref Range   TSH 2.01 0.41 - 5.90     Assessment & Plan:   1. Uncontrolled type 2 diabetes mellitus with hyperglycemia ([place])  - DEJA POLIDORI has currently uncontrolled symptomatic type 2 DM since  59 years of age,  with most recent A1c of 8.9 %. Recent labs reviewed. - I had a long discussion with her about the progressive nature of diabetes and the pathology behind its complications. -her diabetes is complicated by morbid obesity, sedentary life, hypertension and she remains at a high risk for more acute and chronic complications which include CAD, CVA, CKD, retinopathy, and neuropathy. These are all discussed in detail with her.  - I have counseled her on diet  and weight management  by adopting a carbohydrate restricted/protein rich diet. Patient is encouraged to switch to  unprocessed or minimally processed     complex starch and increased protein intake (animal or plant source), fruits, and vegetables. -  she is advised to stick to a routine mealtimes to eat 3 meals  a day and avoid unnecessary snacks ( to snack only to correct hypoglycemia).   - she admits that there is a room for improvement in her food and drink choices. - Suggestion is made for her to avoid simple carbohydrates  from her diet including Cakes, Sweet Desserts, Ice Cream, Soda (diet and regular), Sweet Tea, Candies, Chips, Cookies, Store Bought Juices, Alcohol in Excess of  1-2 drinks a day, Artificial Sweeteners,  Coffee Creamer, and "Sugar-free" Products. This will help patient to have more stable blood glucose profile and  potentially avoid unintended weight gain.  - she will be scheduled with Jearld Fenton, RDN, CDE for diabetes education.  - I have approached her with the following individualized plan to manage  her diabetes and patient agrees:   - she will need initiation of at least basal insulin in order for her to achieve control of diabetes to target.  Patient agrees, discussed and initiated Lantus 20 units at bedtime daily, associated with strict monitoring of blood glucose 4 times a day before meals and at bedtime.    - she is warned not to take insulin without proper monitoring per orders.  - she is encouraged to call clinic for blood glucose levels less than 70 or above 300 mg /dl. - she is advised to continue Byetta 5 mcg subcutaneously twice daily, therapeutically suitable for patient . -She is advised to continue Metformin 1000 mg p.o. twice daily-daily after breakfast and supper.  - Specific targets for  A1c;  LDL, HDL,  and Triglycerides were discussed with the patient.   2) Blood Pressure /Hypertension:  her blood pressure is not controlled to target.   she is advised to continue her current medications including lisinopril 5 mg p.o. daily with breakfast .  3) Lipids/Hyperlipidemia:     -  Her recent lipid panel showed uncontrolled LDL of 91.  She will benefit from statin treatment, will be considered next visit.    4)  Weight/Diet:  Body mass index is 48 kg/m.  -   clearly complicating her diabetes care.   she is  a candidate for weight loss. I discussed with her the fact that loss of 5 - 10% of her  current body weight will have the most impact on her diabetes management.  Exercise, and detailed carbohydrates information provided  -  detailed on discharge instructions. -She is status post previous lap band with successful loss of weight, recently regained back to her presurgical level.  She is undergoing evaluation for sleeve gastrectomy.  Is encouraged to pursue this treatment measure.  5)  Chronic Care/Health Maintenance:  -she  is on ACEI/ARB and  is encouraged to initiate and continue to follow up with Ophthalmology, Dentist,  Podiatrist at least yearly or according to recommendations, and advised to  stay away from smoking. I have recommended yearly flu vaccine and pneumonia vaccine at least every 5 years; moderate intensity exercise for up to 150 minutes weekly; and  sleep for at least 7 hours a day.  - she is  advised to maintain close follow up with Sharilyn Sites, MD for primary care needs, as well as her other providers for optimal and coordinated care.   - Time spent in this patient care: 60 min, of which > 50% was spent in  counseling  her about her currently uncontrolled, complicated type 2 diabetes, hypertension and the rest reviewing her blood glucose logs , discussing her hypoglycemia and hyperglycemia episodes, reviewing her current and  previous labs / studies  ( including abstraction from other facilities) and medications  doses and developing a  long term treatment plan based on the latest standards of care/ guidelines; and documenting her care.    Please refer to Patient Instructions for Blood Glucose Monitoring and Insulin/Medications Dosing Guide"  in media tab for additional information. Please  also refer to " Patient Self Inventory" in the Media  tab for reviewed elements of pertinent patient history.  Francesca Jewett participated in the discussions, expressed understanding, and voiced agreement with the above plans.  All questions were answered to her satisfaction. she is encouraged to contact clinic should she have any questions or concerns prior to her return visit.   Follow up plan: - Return in about 10 days (around 05/31/2019) for Follow up with Meter and Logs Only - no Labs.  Glade Lloyd, MD [facility] [address] [place],  [address] Phone: [phone]  Fax: [phone]    05/21/2019,  10:51 PM  This note was partially dictated with voice recognition software. Similar sounding words can be transcribed inadequately or may not  be corrected upon review.

## 2019-05-22 ENCOUNTER — Other Ambulatory Visit: Payer: Self-pay

## 2019-05-24 ENCOUNTER — Telehealth: Payer: Self-pay | Admitting: "Endocrinology

## 2019-05-24 DIAGNOSIS — E1165 Type 2 diabetes mellitus with hyperglycemia: Secondary | ICD-10-CM

## 2019-05-24 MED ORDER — BD PEN NEEDLE SHORT U/F 31G X 8 MM MISC: 1.0000 | 3 refills | Status: DC

## 2019-05-24 NOTE — Telephone Encounter (Signed)
Rx refill for insulin pen needles sent to [facility] on [address].

## 2019-05-24 NOTE — Telephone Encounter (Signed)
Can you resend this?   Insulin Pen Needle (B-D ULTRAFINE III SHORT PEN) 31G X 8 MM MISC   [facility] [address]

## 2019-06-04 ENCOUNTER — Ambulatory Visit (INDEPENDENT_AMBULATORY_CARE_PROVIDER_SITE_OTHER): Payer: Medicare Other | Admitting: "Endocrinology

## 2019-06-04 ENCOUNTER — Encounter: Payer: Self-pay | Admitting: "Endocrinology

## 2019-06-04 DIAGNOSIS — I10 Essential (primary) hypertension: Secondary | ICD-10-CM | POA: Diagnosis not present

## 2019-06-04 DIAGNOSIS — E1165 Type 2 diabetes mellitus with hyperglycemia: Secondary | ICD-10-CM

## 2019-06-04 DIAGNOSIS — E782 Mixed hyperlipidemia: Secondary | ICD-10-CM

## 2019-06-04 MED ORDER — LANTUS SOLOSTAR 100 UNIT/ML ~~LOC~~ SOPN: 30.0000 [IU] | PEN_INJECTOR | Freq: Every day | SUBCUTANEOUS | 2 refills | Status: DC

## 2019-06-04 NOTE — Patient Instructions (Signed)
                                     Advice for Weight Management  -For most of us the best way to lose weight is by diet management. Generally speaking, diet management means consuming less calories intentionally which over time brings about progressive weight loss.  This can be achieved more effectively by restricting carbohydrate consumption to the minimum possible.  So, it is critically important to know your numbers: how much calorie you are consuming and how much calorie you need. More importantly, our carbohydrates sources should be unprocessed or minimally processed complex starch food items.   Sometimes, it is important to balance nutrition by increasing protein intake (animal or plant source), fruits, and vegetables.  -Sticking to a routine mealtime to eat 3 meals a day and avoiding unnecessary snacks is shown to have a big role in weight control. Under normal circumstances, the only time we lose real weight is when we are hungry, so allow hunger to take place- hunger means no food between meal times, only water.  It is not advisable to starve.   -It is better to avoid simple carbohydrates including: Cakes, Sweet Desserts, Ice Cream, Soda (diet and regular), Sweet Tea, Candies, Chips, Cookies, Store Bought Juices, Alcohol in Excess of  1-2 drinks a day, Artificial Sweeteners, Doughnuts, Coffee Creamers, "Sugar-free" Products, etc, etc.  This is not a complete list.....    -Consulting with certified diabetes educators is proven to provide you with the most accurate and current information on diet.  Also, you may be  interested in discussing diet options/exchanges , we can schedule a visit with Penny Crumpton, RDN, CDE for individualized nutrition education.  -Exercise: If you are able: 30 -60 minutes a day ,4 days a week, or 150 minutes a week.  The longer the better.  Combine stretch, strength, and aerobic activities.  If you were told in the past that you  have high risk for cardiovascular diseases, you may seek evaluation by your heart doctor prior to initiating moderate to intense exercise programs.                                  Additional Care Considerations for Diabetes   -Diabetes  is a chronic disease.  The most important care consideration is regular follow-up with your diabetes care provider with the goal being avoiding or delaying its complications and to take advantage of advances in medications and technology.    -Type 2 diabetes is known to coexist with other important comorbidities such as high blood pressure and high cholesterol.  It is critical to control not only the diabetes but also the high blood pressure and high cholesterol to minimize and delay the risk of complications including coronary artery disease, stroke, amputations, blindness, etc.    - Studies showed that people with diabetes will benefit from a class of medications known as ACE inhibitors and statins.  Unless there are specific reasons not to be on these medications, the standard of care is to consider getting one from these groups of medications at an optimal doses.  These medications are generally considered safe and proven to help protect the heart and the kidneys.    - People with diabetes are encouraged to initiate and maintain regular follow-up with eye doctors, foot doctors, dentists ,   and if necessary heart and kidney doctors.     - It is highly recommended that people with diabetes quit smoking or stay away from smoking, and get yearly  flu vaccine and pneumonia vaccine at least every 5 years.  One other important lifestyle recommendation is to ensure adequate sleep - at least 6-7 hours of uninterrupted sleep at night.  -Exercise: If you are able: 30 -60 minutes a day, 4 days a week, or 150 minutes a week.  The longer the better.  Combine stretch, strength, and aerobic activities.  If you were told in the past that you have high risk for cardiovascular  diseases, you may seek evaluation by your heart doctor prior to initiating moderate to intense exercise programs.     COVID-19 Vaccine Information can be found at: [email or web address] For questions related to vaccine distribution or appointments, please email [email or web address] or call [phone]        

## 2019-06-04 NOTE — Progress Notes (Signed)
06/04/2019, 5:17 PM                                                    Endocrinology Telehealth Visit Follow up Note -During COVID -19 Pandemic  This visit type was conducted due to national recommendations for restrictions regarding the COVID-19 Pandemic  in an effort to limit this patient's exposure and mitigate transmission of the corona virus.  Due to her co-morbid illnesses, [name] is at  moderate to high risk for complications without adequate follow up.  This format is felt to be most appropriate for her at this time.  I connected with this patient on 06/04/2019   by telephone and verified that I am speaking with the correct person using two identifiers. [name], 06/12/1960. she has verbally consented to this visit. All issues noted in this document were discussed and addressed. The format was not optimal for physical exam.    Subjective:    Patient ID: [name], female    DOB: [birth year].  [name] is being engaged in telehealth via telephone after she was seen in consultation for management of currently uncontrolled symptomatic diabetes requested by  Sharilyn Sites, MD.   Past Medical History:  Diagnosis Date  . Adenomatous polyp 2009  . Anemia 2003 after surgery needed 1 unit blood  . Anxiety   . Arthritis   . Bipolar 1 disorder ([place])   . Constipation   . Depression   . Diabetes mellitus   . Diabetes mellitus, type II ([place])   . Diverticula of colon 2009  . Generalized headaches   . GERD (gastroesophageal reflux disease)   . History of hiatal hernia    small  . Hypertension   . Migraine   . Obesity   . Pelvic floor dysfunction    abnormal anorectal manometry at [facility] in 2009  . Plantar fasciitis both feet  . Psoriasis   . Sleep apnea    cpap setting of 3.5    Past Surgical History:  Procedure Laterality Date  . ABDOMINAL HYSTERECTOMY     compete  . CATARACT EXTRACTION W/PHACO Right 02/15/2017   Procedure:  CATARACT EXTRACTION PHACO AND INTRAOCULAR LENS PLACEMENT (IOC);  Surgeon: Rutherford Guys, MD;  Location: [facility];  Service: Ophthalmology;  Laterality: Right;  CDE: 4.18  . CATARACT EXTRACTION W/PHACO Left 03/01/2017   Procedure: CATARACT EXTRACTION PHACO AND INTRAOCULAR LENS PLACEMENT (IOC);  Surgeon: Rutherford Guys, MD;  Location: [facility];  Service: Ophthalmology;  Laterality: Left;  CDE: 2.56  . COLON RESECTION    03/30/2002   with end-colostomy and Hartmann's pouch  . COLON SURGERY     complicated diverticulitis requiring sigmoid resection with colostomy and subsequent takedown  . COLONOSCOPY  12/11/2007   Dr. Gala Romney- marginal prep, normal rectum pancolonic diverticula, adenomatous polyp  . COLONOSCOPY  08/28/2003      Wide open colonic anastomosis/Scattered diverticula noted throughout colon/ Small external hemorrhoids  . COLONOSCOPY  04/2012   [facility]: hyperplastic polyps, diverticulosis, ileocolonic anastomosis.  . COLONOSCOPY WITH PROPOFOL N/A 06/07/2016   Procedure: COLONOSCOPY WITH PROPOFOL;  Surgeon: Daneil Dolin, MD;  Location: [facility];  Service: Endoscopy;  Laterality: N/A;  7:30 am  . COLOSTOMY CLOSURE  07/10/2002  . ESOPHAGOGASTRODUODENOSCOPY (EGD) WITH PROPOFOL N/A 08/23/2016  Procedure: ESOPHAGOGASTRODUODENOSCOPY (EGD) WITH PROPOFOL;  Surgeon: Alphonsa Overall, MD;  Location: [facility];  Service: General;  Laterality: N/A;  . FLEXIBLE SIGMOIDOSCOPY  01/20/2012   [facility]: incomplete/attempted colonoscopy. Inadequate prep precluded examination  . HEEL SPUR SURGERY Left 09/11/2013   both have been done  . KNEE ARTHROSCOPY  02/04/2004    left knee/partial medial meniscectomy.  Marland Kitchen KNEE SURGERY     3 arthroscopic  2 on left 1 on right  . LAPAROSCOPIC GASTRIC BANDING  2009  . LAPAROSCOPIC SALPINGOOPHERECTOMY  03/30/2002  . TUBAL LIGATION      Social History   Socioeconomic History  . Marital status: Married    Spouse name: Not on file  . Number of children: 2  . Years of  education: college  . Highest education level: Not on file  Occupational History  . 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 Smoking status: Never Smoker  . Smokeless tobacco: Never Used  Substance and Sexual Activity  . Alcohol use: Yes    Comment: occasionally wine  . Drug use: No  . Sexual activity: Not Currently    Birth control/protection: Surgical  Other Topics Concern  . Not on file  Social History Narrative  . Not on file   Social Determinants of Health   Financial Resource Strain:   . Difficulty of Paying Living Expenses: Not on file  Food Insecurity:   . Worried About Charity fundraiser in the Last Year: Not on file  . Ran Out of Food in the Last Year: Not on file  Transportation Needs:   . Lack of Transportation (Medical): Not on file  . Lack of Transportation (Non-Medical): Not on file  Physical Activity:   . Days of Exercise per Week: Not on file  . Minutes of Exercise per Session: Not on file  Stress:   . Feeling of Stress : Not on file  Social Connections:   . Frequency of Communication with Friends and Family: Not on file  . Frequency of Social Gatherings with Friends and Family: Not on file  . Attends Religious Services: Not on file  . Active Member of Clubs or Organizations: Not on file  . Attends Archivist Meetings: Not on file  . Marital Status: Not on file    Family History  Problem Relation Age of Onset  . Ovarian cancer Mother   . Heart disease Mother   . Anxiety disorder Mother   . Cirrhosis Father        deceased age 57  . Alcohol abuse Father   . Liver cancer Cousin        age 44, deceased  . Drug abuse Cousin   . ADD / ADHD Son   . Anxiety disorder Sister   . Dementia Maternal Grandfather   . Colon cancer Other        aunt, deceased age 70  . Breast cancer Other        aunt, deceased age 67  . Bipolar disorder Neg Hx   . Depression Neg Hx   . OCD Neg Hx   .  Paranoid behavior Neg Hx   . Schizophrenia Neg Hx   . Seizures Neg Hx   . Sexual abuse Neg Hx   . Physical abuse Neg Hx     Outpatient Encounter Medications as of 06/04/2019  Medication Sig  . ALPRAZolam (XANAX) 1 MG tablet Take 1 tablet (1 mg  total) by mouth 3 (three) times daily as needed for anxiety.  . ARIPiprazole (ABILIFY) 10 MG tablet Take 1 tablet (10 mg total) by mouth at bedtime.  . cycloSPORINE (RESTASIS) 0.05 % ophthalmic emulsion Place 2 drops into both eyes 2 (two) times daily.   . diclofenac (VOLTAREN) 75 MG EC tablet Take 1 tablet (75 mg total) by mouth 2 (two) times daily.  Marland Kitchen escitalopram (LEXAPRO) 20 MG tablet Take 1 tablet (20 mg total) by mouth daily.  Marland Kitchen estradiol (ESTRACE) 2 MG tablet Take 1 tablet (2 mg total) by mouth daily.  Marland Kitchen exenatide (BYETTA) 5 MCG/0.02ML SOPN injection Inject 5 mcg into the skin 2 (two) times daily with a meal.  . fluticasone (FLONASE) 50 MCG/ACT nasal spray Place 2 sprays into the nose daily as needed for allergies.   Marland Kitchen gabapentin (NEURONTIN) 100 MG capsule TK ONE C PO QHS  . Insulin Glargine (LANTUS SOLOSTAR) 100 UNIT/ML Solostar Pen Inject 30 Units into the skin at bedtime.  . Insulin Pen Needle (B-D ULTRAFINE III SHORT PEN) 31G X 8 MM MISC 1 each by Does not apply route as directed.  . linaclotide (LINZESS) 290 MCG CAPS capsule Take 1 capsule (290 mcg total) by mouth daily. (Patient taking differently: Take 290 mcg by mouth daily as needed (constipation). )  . lisinopril (PRINIVIL,ZESTRIL) 5 MG tablet   . loratadine (CLARITIN) 10 MG tablet Take 10 mg by mouth daily.  . meclizine (ANTIVERT) 25 MG tablet Take 25 mg by mouth daily as needed for dizziness.   . meloxicam (MOBIC) 15 MG tablet Take 1 tablet (15 mg total) by mouth daily.  . metFORMIN (GLUCOPHAGE) 500 MG tablet Take 500 mg by mouth 2 (two) times daily.   . methocarbamol (ROBAXIN) 500 MG tablet Take 500 mg by mouth 3 (three) times daily as needed for muscle spasms.   . Multiple Vitamin  (MULTIVITAMIN PO) Take 1 tablet by mouth daily.   . ondansetron (ZOFRAN ODT) 4 MG disintegrating tablet Take 1 tablet (4 mg total) by mouth every 8 (eight) hours as needed for nausea or vomiting.  Marland Kitchen oxyCODONE-acetaminophen (PERCOCET/ROXICET) 5-325 MG tablet Take 1 tablet by mouth every 6 (six) hours as needed for moderate pain or severe pain (Must last 30 days.).  Marland Kitchen pantoprazole (PROTONIX) 40 MG tablet Take 1 tablet (40 mg total) by mouth 2 (two) times daily.  . polyethylene glycol (MIRALAX / GLYCOLAX) packet Take 17 g by mouth daily.   Marland Kitchen Propylene Glycol-Glycerin (SOOTHE) 0.6-0.6 % SOLN Place 1 drop into both eyes 2 (two) times daily.  . Psyllium (METAMUCIL PO) Take 1 each by mouth 2 (two) times daily.   . rizatriptan (MAXALT-MLT) 10 MG disintegrating tablet Take 10 mg by mouth as needed for migraine. May repeat in 2 hours if needed  . temazepam (RESTORIL) 30 MG capsule Take 1 capsule (30 mg total) by mouth at bedtime as needed for sleep.  Marland Kitchen terconazole (TERAZOL 7) 0.4 % vaginal cream Place 1 applicator vaginally at bedtime.  . [DISCONTINUED] Insulin Glargine (LANTUS SOLOSTAR) 100 UNIT/ML Solostar Pen Inject 20 Units into the skin at bedtime.   No facility-administered encounter medications on file as of 06/04/2019.    ALLERGIES: Allergies  Allergen Reactions  . Bactrim Itching, Nausea And Vomiting and Other (See Comments)    Redness    VACCINATION STATUS:  There is no immunization history on file for this patient.  Diabetes She presents for her follow-up diabetic visit. She has type 2 diabetes mellitus. Onset time:  She was diagnosed at approximate age of 5 years. Her disease course has been improving. There are no hypoglycemic associated symptoms. Pertinent negatives for hypoglycemia include no confusion, headaches, pallor or seizures. Associated symptoms include fatigue. Pertinent negatives for diabetes include no chest pain, no polydipsia, no polyphagia and no polyuria. There are no  hypoglycemic complications. Symptoms are improving. Diabetic complications include heart disease. Risk factors for coronary artery disease include dyslipidemia, family history, diabetes mellitus, hypertension, obesity, sedentary lifestyle and post-menopausal. Current diabetic treatments: She is currently on Byetta 5 mg twice daily, Metformin 1000 mg p.o. twice daily. Her weight is increasing steadily (She has previous history of lap band, recently increasing her weight to presurgical levels of 300 pounds.). She is following a generally unhealthy diet. When asked about meal planning, she reported none. She has not had a previous visit with a dietitian. She never participates in exercise. Her breakfast blood glucose range is generally 140-180 mg/dl. Her lunch blood glucose range is generally 140-180 mg/dl. Her dinner blood glucose range is generally 140-180 mg/dl. Her bedtime blood glucose range is generally 140-180 mg/dl. Her overall blood glucose range is 140-180 mg/dl. (She reports improving glycemic profile, still slightly above target both fasting and postprandial.  She has no hypoglycemia.  Her most recent A1c was 8.9%.    ) An ACE inhibitor/angiotensin II receptor blocker is being taken. Eye exam is current.  Hypertension This is a chronic problem. The current episode started more than 1 year ago. The problem is uncontrolled. Pertinent negatives include no chest pain, headaches, palpitations or shortness of breath. Risk factors for coronary artery disease include diabetes mellitus, obesity, sedentary lifestyle and post-menopausal state. Past treatments include ACE inhibitors.    Review of systems: Limited as above. Objective:    There were no vitals taken for this visit.  Wt Readings from Last 3 Encounters:  05/21/19 297 lb 6.4 oz (134.9 kg)  05/01/19 298 lb (135.2 kg)  04/02/19 290 lb (131.5 kg)      CMP     Component Value Date/Time   NA 138 04/02/2019 1152   K 4.1 04/02/2019 1152   CL  102 04/02/2019 1152   CO2 24 04/02/2019 1152   GLUCOSE 147 (H) 04/02/2019 1152   BUN 19 04/17/2019 0000   CREATININE 0.7 04/17/2019 0000   CREATININE 0.60 04/02/2019 1152   CREATININE 0.74 03/23/2011 0400   CALCIUM 9.3 04/02/2019 1152   PROT 8.0 04/02/2019 1351   ALBUMIN 4.1 04/02/2019 1351   AST 31 04/02/2019 1351   ALT 39 04/02/2019 1351   ALKPHOS 89 04/02/2019 1351   BILITOT 0.6 04/02/2019 1351   GFRNONAA >60 04/02/2019 1152   GFRAA >60 04/02/2019 1152   Recent Results (from the past 2160 hour(s))  Basic metabolic panel     Status: Abnormal   Collection Time: 04/02/19 11:52 AM  Result Value Ref Range   Sodium 138 135 - 145 mmol/L   Potassium 4.1 3.5 - 5.1 mmol/L   Chloride 102 98 - 111 mmol/L   CO2 24 22 - 32 mmol/L   Glucose, Bld 147 (H) 70 - 99 mg/dL   BUN 22 (H) 6 - 20 mg/dL   Creatinine, Ser 0.60 0.44 - 1.00 mg/dL   Calcium 9.3 8.9 - 10.3 mg/dL   GFR calc non Af Amer >60 >60 mL/min   GFR calc Af Amer >60 >60 mL/min   Anion gap 12 5 - 15    Comment: Performed at [facility], [address],  [place], [place] [address]  CBC     Status: Abnormal   Collection Time: 04/02/19 11:52 AM  Result Value Ref Range   WBC 7.4 4.0 - 10.5 K/uL   RBC 5.50 (H) 3.87 - 5.11 MIL/uL   Hemoglobin 15.5 (H) 12.0 - 15.0 g/dL   HCT 49.1 (H) 36.0 - 46.0 %   MCV 89.3 80.0 - 100.0 fL   MCH 28.2 26.0 - 34.0 pg   MCHC 31.6 30.0 - 36.0 g/dL   RDW 12.9 11.5 - 15.5 %   Platelets 225 150 - 400 K/uL   nRBC 0.0 0.0 - 0.2 %    Comment: Performed at [facility], [address], [place], [place] [address]  Troponin I (High Sensitivity)     Status: None   Collection Time: 04/02/19 11:52 AM  Result Value Ref Range   Troponin I (High Sensitivity) 3 <18 ng/L    Comment: (NOTE) Elevated high sensitivity troponin I (hsTnI) values and significant  changes across serial measurements may suggest ACS but many other  chronic and acute conditions are known to elevate hsTnI results.  Refer to the "Links"  section for chest pain algorithms and additional  guidance. Performed at [facility], [address], [place], [place] [address]   Troponin I (High Sensitivity)     Status: None   Collection Time: 04/02/19  1:51 PM  Result Value Ref Range   Troponin I (High Sensitivity) 3 <18 ng/L    Comment: (NOTE) Elevated high sensitivity troponin I (hsTnI) values and significant  changes across serial measurements may suggest ACS but many other  chronic and acute conditions are known to elevate hsTnI results.  Refer to the "Links" section for chest pain algorithms and additional  guidance. Performed at [facility], [address] [place], [place] [address]   Hepatic function panel     Status: None   Collection Time: 04/02/19  1:51 PM  Result Value Ref Range   Total Protein 8.0 6.5 - 8.1 g/dL   Albumin 4.1 3.5 - 5.0 g/dL   AST 31 15 - 41 U/L   ALT 39 0 - 44 U/L   Alkaline Phosphatase 89 38 - 126 U/L   Total Bilirubin 0.6 0.3 - 1.2 mg/dL   Bilirubin, Direct 0.1 0.0 - 0.2 mg/dL   Indirect Bilirubin 0.5 0.3 - 0.9 mg/dL    Comment: Performed at [facility], [address] [place],  [address]  Hemoglobin A1c     Status: None   Collection Time: 04/16/19 12:00 AM  Result Value Ref Range   Hemoglobin A1C 8.9   Lipid panel     Status: Abnormal   Collection Time: 04/16/19 12:00 AM  Result Value Ref Range   Triglycerides 243 (A) 40 - 160   Cholesterol 176 0 - 200   HDL 44 35 - 70   LDL Cholesterol 91   TSH     Status: None   Collection Time: 04/16/19 12:00 AM  Result Value Ref Range   TSH 2.01 0.41 - [id number]  Basic metabolic panel     Status: None   Collection Time: 04/17/19 12:00 AM  Result Value Ref Range   BUN 19 4 - 21   Creatinine 0.7 0.5 - 1.1     Assessment & Plan:   1. Uncontrolled type 2 diabetes mellitus with hyperglycemia ([place])  - [name] has currently uncontrolled symptomatic type 2 DM since  59 years of age. -She reports improving glycemic profile, slightly  above target.  She did not have any hypoglycemic episodes.  her most recent A1c was 8.9%.  - Recent labs reviewed. - I had a long discussion with her about the progressive nature of diabetes and the pathology behind its complications. -her diabetes is complicated by morbid obesity, sedentary life, hypertension and she remains at a high risk for more acute and chronic complications which include CAD, CVA, CKD, retinopathy, and neuropathy. These are all discussed in detail with her.  - I have counseled her on diet  and weight management  by adopting a carbohydrate restricted/protein rich diet. Patient is encouraged to switch to  unprocessed or minimally processed     complex starch and increased protein intake (animal or plant source), fruits, and vegetables. -  she is advised to stick to a routine mealtimes to eat 3 meals  a day and avoid unnecessary snacks ( to snack only to correct hypoglycemia).   - she  admits there is a room for improvement in her diet and drink choices. -  Suggestion is made for her to avoid simple carbohydrates  from her diet including Cakes, Sweet Desserts / Pastries, Ice Cream, Soda (diet and regular), Sweet Tea, Candies, Chips, Cookies, Sweet Pastries,  Store Bought Juices, Alcohol in Excess of  1-2 drinks a day, Artificial Sweeteners, Coffee Creamer, and "Sugar-free" Products. This will help patient to have stable blood glucose profile and potentially avoid unintended weight gain.   - she will be scheduled with Jearld Fenton, RDN, CDE for diabetes education.  - I have approached her with the following individualized plan to manage  her diabetes and patient agrees:   - she will continue to need basal insulin in order for her to achieve and maintain control of diabetes to target.   -She is comfortable taking her injection, discussed and increase her Lantus to 30 units nightly, associated with strict monitoring of blood glucose 2 times a day-daily before breakfast and at  bedtime.    - she is warned not to take insulin without proper monitoring per orders.  - she is encouraged to call clinic for blood glucose levels less than 70 or above 300 mg /dl. - she is advised to continue Byetta 5 mcg subcutaneously twice daily, therapeutically suitable for patient . -She is advised to continue Metformin 1000 mg p.o. twice daily--daily after breakfast and supper.  - Specific targets for  A1c;  LDL, HDL,  and Triglycerides were discussed with the patient.   2) Blood Pressure /Hypertension:  she is advised to home monitor blood pressure and report if > 140/90 on 2 separate readings.   she is advised to continue her current medications including lisinopril 5 mg p.o. daily with breakfast .  3) Lipids/Hyperlipidemia:     -Her recent lipid panel showed uncontrolled LDL of 91.  She will benefit from statin treatment, will be considered next visit.    4)  Weight/Diet: Her BMI is 48-   clearly complicating her diabetes care.   she is  a candidate for weight loss. I discussed with her the fact that loss of 5 - 10% of her  current body weight will have the most impact on her diabetes management.  Exercise, and detailed carbohydrates information provided  -  detailed on discharge instructions. -She is status post previous lap band with successful loss of weight, recently regained back to her presurgical level.  She is undergoing evaluation for sleeve gastrectomy.  Is encouraged to pursue this treatment measure.  5) Chronic  Care/Health Maintenance:  -she  is on ACEI/ARB and  is encouraged to initiate and continue to follow up with Ophthalmology, Dentist,  Podiatrist at least yearly or according to recommendations, and advised to  stay away from smoking. I have recommended yearly flu vaccine and pneumonia vaccine at least every 5 years; moderate intensity exercise for up to 150 minutes weekly; and  sleep for at least 7 hours a day.  - she is  advised to maintain close follow up with  Sharilyn Sites, MD for primary care needs, as well as her other providers for optimal and coordinated care.  - Time spent on this patient care encounter:  35 min, of which >50% was spent in  counseling and the rest reviewing her  current and  previous labs/studies ( including abstraction from other facilities),  previous treatments, her blood glucose readings, and medications' doses and developing a plan for long-term care based on the latest recommendations for standards of care; and documenting her care.  Francesca Jewett participated in the discussions, expressed understanding, and voiced agreement with the above plans.  All questions were answered to her satisfaction. she is encouraged to contact clinic should she have any questions or concerns prior to her return visit.   Follow up plan: - Return in about 3 months (around 09/02/2019) for Bring Meter and Logs- A1c in Office.  Glade Lloyd, MD [facility] [address] [place], [place] [address] Phone: [phone]  Fax: [phone]    06/04/2019, 5:17 PM  This note was partially dictated with voice recognition software. Similar sounding words can be transcribed inadequately or may not  be corrected upon review.

## 2019-06-11 ENCOUNTER — Other Ambulatory Visit: Payer: Self-pay | Admitting: Surgery

## 2019-06-12 ENCOUNTER — Ambulatory Visit (INDEPENDENT_AMBULATORY_CARE_PROVIDER_SITE_OTHER): Payer: Medicare Other | Admitting: Orthopaedic Surgery

## 2019-06-12 ENCOUNTER — Encounter: Payer: Self-pay | Admitting: Orthopaedic Surgery

## 2019-06-12 ENCOUNTER — Other Ambulatory Visit: Payer: Self-pay

## 2019-06-12 VITALS — BP 138/76 | HR 67 | Ht 66.0 in | Wt 296.0 lb

## 2019-06-12 DIAGNOSIS — Z6841 Body Mass Index (BMI) 40.0 and over, adult: Secondary | ICD-10-CM

## 2019-06-12 DIAGNOSIS — M25562 Pain in left knee: Secondary | ICD-10-CM | POA: Diagnosis not present

## 2019-06-12 DIAGNOSIS — G8929 Other chronic pain: Secondary | ICD-10-CM

## 2019-06-12 DIAGNOSIS — M25561 Pain in right knee: Secondary | ICD-10-CM

## 2019-06-12 MED ORDER — OXYCODONE-ACETAMINOPHEN 5-325 MG PO TABS: 1.0000 | ORAL_TABLET | Freq: Four times a day (QID) | ORAL | 0 refills | Status: DC | PRN

## 2019-06-12 NOTE — Progress Notes (Signed)
Patient [id number] [name], female DOB:1960/11/12, 59 y.o. [id number]  Chief Complaint  Patient presents with  . Knee Pain    bilateral     HPI  [name] is a 59 y.o. female who has bilateral knee pain  She has good and bad days.  The injections helped but she still has swelling and popping but no giving way She is using her knee braces. She has no new trauma.  She is taking her medicine.   Body mass index is 47.78 kg/m.  The patient meets the AMA guidelines for Morbid (severe) obesity with a BMI > 40.0 and I have recommended weight loss.   ROS  Review of Systems  Constitutional:       Patient has Diabetes Mellitus. Patient has hypertension. Patient does not have COPD or shortness of breath. Patient has BMI > 35. Patient does not have current smoking history.  HENT: Negative for congestion.   Respiratory: Positive for cough. Negative for shortness of breath.   Endocrine: Positive for cold intolerance.  Musculoskeletal: Positive for arthralgias, gait problem and joint swelling.  Allergic/Immunologic: Positive for environmental allergies.  Neurological: Positive for headaches.  Psychiatric/Behavioral: The patient is nervous/anxious.     All other systems reviewed and are negative.  The following is a summary of the past history medically, past history surgically, known current medicines, social history and family history.  This information is gathered electronically by the computer from prior information and documentation.  I review this each visit and have found including this information at this point in the chart is beneficial and informative.    Past Medical History:  Diagnosis Date  . Adenomatous polyp 2009  . Anemia 2003 after surgery needed 1 unit blood  . Anxiety   . Arthritis   . Bipolar 1 disorder ([place])   . Constipation   . Depression   . Diabetes mellitus   . Diabetes mellitus, type II ([place])   . Diverticula of colon 2009  . Generalized  headaches   . GERD (gastroesophageal reflux disease)   . History of hiatal hernia    small  . Hypertension   . Migraine   . Obesity   . Pelvic floor dysfunction    abnormal [facility]orectal [facility]ometry at [facility] in 2009  . Plantar fasciitis both feet  . Psoriasis   . Sleep apnea    cpap setting of 3.5    Past Surgical History:  Procedure Laterality Date  . ABDOMINAL HYSTERECTOMY     compete  . CATARACT EXTRACTION W/PHACO Right 02/15/2017   Procedure: CATARACT EXTRACTION PHACO AND INTRAOCULAR LENS PLACEMENT (IOC);  Surgeon: Rutherford Guys, MD;  Location: [facility];  Service: Ophthalmology;  Laterality: Right;  CDE: 4.18  . CATARACT EXTRACTION W/PHACO Left 03/01/2017   Procedure: CATARACT EXTRACTION PHACO AND INTRAOCULAR LENS PLACEMENT (IOC);  Surgeon: Rutherford Guys, MD;  Location: [facility];  Service: Ophthalmology;  Laterality: Left;  CDE: 2.56  . COLON RESECTION    03/30/2002   with end-colostomy and Hartmann's pouch  . COLON SURGERY     complicated diverticulitis requiring sigmoid resection with colostomy and subsequent takedown  . COLONOSCOPY  12/11/2007   Dr. Gala Romney- marginal prep, normal rectum pancolonic diverticula, adenomatous polyp  . COLONOSCOPY  08/28/2003      Wide open colonic anastomosis/Scattered diverticula noted throughout colon/ Small external hemorrhoids  . COLONOSCOPY  04/2012   [facility]: hyperplastic polyps, diverticulosis, ileocolonic anastomosis.  . COLONOSCOPY WITH PROPOFOL N/A 06/07/2016   Procedure: COLONOSCOPY WITH PROPOFOL;  Surgeon: Daneil Dolin, MD;  [facility];  Service: Endoscopy;  Laterality: N/A;  7:30 am  . COLOSTOMY CLOSURE  07/10/2002  . ESOPHAGOGASTRODUODENOSCOPY (EGD) WITH PROPOFOL N/A 08/23/2016   Procedure: ESOPHAGOGASTRODUODENOSCOPY (EGD) WITH PROPOFOL;  Surgeon: Alphonsa Overall, MD;  [facility];  Service: General;  Laterality: N/A;  . FLEXIBLE SIGMOIDOSCOPY  01/20/2012   RMR: incomplete/attempted colonoscopy. Inadequate prep precluded  examination  . HEEL SPUR SURGERY Left 09/11/2013   both have been done  . KNEE ARTHROSCOPY  02/04/2004    left knee/partial medial meniscectomy.  Marland Kitchen KNEE SURGERY     3 arthroscopic  2 on left 1 on right  . LAPAROSCOPIC GASTRIC BANDING  2009  . LAPAROSCOPIC SALPINGOOPHERECTOMY  03/30/2002  . TUBAL LIGATION      Family History  Problem Relation Age of Onset  . Ovarian cancer Mother   . Heart disease Mother   . Anxiety disorder Mother   . Cirrhosis Father        deceased age 37  . Alcohol abuse Father   . Liver cancer Cousin        age 39, deceased  . Drug abuse Cousin   . ADD / ADHD Son   . Anxiety disorder Sister   . Dementia Maternal Grandfather   . Colon cancer Other        aunt, deceased age 76  . Breast cancer Other        aunt, deceased age 52  . Bipolar disorder Neg Hx   . Depression Neg Hx   . OCD Neg Hx   . Paranoid behavior Neg Hx   . Schizophrenia Neg Hx   . Seizures Neg Hx   . Sexual abuse Neg Hx   . Physical abuse Neg Hx     Social History Social History   Tobacco Use  . Smoking status: Never Smoker  . Smokeless tobacco: Never Used  Substance Use Topics  . Alcohol use: Yes    Comment: occasionally wine  . Drug use: No    Allergies  Allergen Reactions  . Bactrim Itching, Nausea And Vomiting and Other (See Comments)    Redness    Current Outpatient Medications  Medication Sig Dispense Refill  . ALPRAZolam (XANAX) 1 MG tablet Take 1 tablet (1 mg total) by mouth 3 (three) times daily as needed for anxiety. 270 tablet 1  . ARIPiprazole (ABILIFY) 10 MG tablet Take 1 tablet (10 mg total) by mouth at bedtime. 90 tablet 2  . cycloSPORINE (RESTASIS) 0.05 % ophthalmic emulsion Place 2 drops into both eyes 2 (two) times daily.     . diclofenac (VOLTAREN) 75 MG EC tablet Take 1 tablet (75 mg total) by mouth 2 (two) times daily. 90 tablet 3  . escitalopram (LEXAPRO) 20 MG tablet Take 1 tablet (20 mg total) by mouth daily. 90 tablet 2  . estradiol (ESTRACE)  2 MG tablet Take 1 tablet (2 mg total) by mouth daily. 30 tablet 11  . exenatide (BYETTA) 5 MCG/0.02ML SOPN injection Inject 5 mcg into the skin 2 (two) times daily with a meal.    . fluticasone (FLONASE) 50 MCG/ACT nasal spray Place 2 sprays into the nose daily as needed for allergies.     Marland Kitchen gabapentin (NEURONTIN) 100 MG capsule TK ONE C PO QHS  11  . Insulin Glargine (LANTUS SOLOSTAR) 100 UNIT/ML Solostar Pen Inject 30 Units into the skin at bedtime. 5 pen 2  . Insulin Pen Needle (B-D ULTRAFINE III SHORT  PEN) 31G X 8 MM MISC 1 each by Does not apply route as directed. 100 each 3  . linaclotide (LINZESS) 290 MCG CAPS capsule Take 1 capsule (290 mcg total) by mouth daily. (Patient taking differently: Take 290 mcg by mouth daily as needed (constipation). ) 90 capsule 3  . lisinopril (PRINIVIL,ZESTRIL) 5 MG tablet     . loratadine (CLARITIN) 10 MG tablet Take 10 mg by mouth daily.    . meclizine (ANTIVERT) 25 MG tablet Take 25 mg by mouth daily as needed for dizziness.   2  . meloxicam (MOBIC) 15 MG tablet Take 1 tablet (15 mg total) by mouth daily. 30 tablet 5  . metFORMIN (GLUCOPHAGE) 500 MG tablet Take 500 mg by mouth 2 (two) times daily.     . methocarbamol (ROBAXIN) 500 MG tablet Take 500 mg by mouth 3 (three) times daily as needed for muscle spasms.     . Multiple Vitamin (MULTIVITAMIN PO) Take 1 tablet by mouth daily.     . ondansetron (ZOFRAN ODT) 4 MG disintegrating tablet Take 1 tablet (4 mg total) by mouth every 8 (eight) hours as needed for nausea or vomiting. 10 tablet 0  . oxyCODONE-acetaminophen (PERCOCET/ROXICET) 5-325 MG tablet Take 1 tablet by mouth every 6 (six) hours as needed for moderate pain or severe pain (Must last 30 days.). 120 tablet 0  . pantoprazole (PROTONIX) 40 MG tablet Take 1 tablet (40 mg total) by mouth 2 (two) times daily. 180 tablet 3  . polyethylene glycol (MIRALAX / GLYCOLAX) packet Take 17 g by mouth daily.     Marland Kitchen Propylene Glycol-Glycerin (SOOTHE) 0.6-0.6 %  SOLN Place 1 drop into both eyes 2 (two) times daily.    . Psyllium (METAMUCIL PO) Take 1 each by mouth 2 (two) times daily.     . rizatriptan (MAXALT-MLT) 10 MG disintegrating tablet Take 10 mg by mouth as needed for migraine. May repeat in 2 hours if needed    . temazepam (RESTORIL) 30 MG capsule Take 1 capsule (30 mg total) by mouth at bedtime as needed for sleep. 90 capsule 2  . terconazole (TERAZOL 7) 0.4 % vaginal cream Place 1 applicator vaginally at bedtime. 45 g 0   No current facility-administered medications for this visit.     Physical Exam  Blood pressure 138/76, pulse 67, height 5\' 6"  (1.676 m), weight 296 lb (134.3 kg).  Constitutional: overall normal hygiene, normal nutrition, well developed, normal grooming, normal body habitus. Assistive device:bilateral knee braces  Musculoskeletal: gait and station Limp right, muscle tone and strength are normal, no tremors or atrophy is present.  .  Neurological: coordination overall normal.  Deep tendon reflex/nerve stretch intact.  Sensation normal.  Cranial nerves II-XII intact.   Skin:   Normal overall no scars, lesions, ulcers or rashes. No psoriasis.  Psychiatric: Alert and oriented x 3.  Recent memory intact, remote memory unclear.  Normal mood and affect. Well groomed.  Good eye contact.  Cardiovascular: overall no swelling, no varicosities, no edema bilaterally, normal temperatures of the legs and arms, no clubbing, cyanosis and good capillary refill.  Lymphatic: palpation is normal.  She has effusion of both knees, crepitus, left 0 to 105, right 0 to 100, limp right, NV intact.  All other systems reviewed and are negative   The patient has been educated about the nature of the problem(s) and counseled on treatment options.  The patient appeared to understand what I have discussed and is in agreement with it.  Encounter Diagnoses  Name Primary?  . Chronic pain of right knee Yes  . Chronic pain of left knee   . Body  mass index 45.0-49.9, adult ([place])   . Morbid obesity ([place])     PLAN Call if any problems.  Precautions discussed.  Continue current medications.   Return to clinic 6 weeks   Electronically Signed Sanjuana Kava, MD 2/2/20219:07 AM

## 2019-06-14 ENCOUNTER — Other Ambulatory Visit: Payer: Self-pay

## 2019-06-14 ENCOUNTER — Encounter (HOSPITAL_COMMUNITY): Payer: Self-pay | Admitting: Psychiatry

## 2019-06-14 ENCOUNTER — Ambulatory Visit (INDEPENDENT_AMBULATORY_CARE_PROVIDER_SITE_OTHER): Payer: Medicare Other | Admitting: Psychiatry

## 2019-06-14 DIAGNOSIS — F3162 Bipolar disorder, current episode mixed, moderate: Secondary | ICD-10-CM

## 2019-06-14 MED ORDER — TEMAZEPAM 30 MG PO CAPS: 30.0000 mg | ORAL_CAPSULE | Freq: Every evening | ORAL | 2 refills | Status: DC | PRN

## 2019-06-14 MED ORDER — ARIPIPRAZOLE 10 MG PO TABS: 10.0000 mg | ORAL_TABLET | Freq: Every day | ORAL | 2 refills | Status: DC

## 2019-06-14 MED ORDER — ALPRAZOLAM 1 MG PO TABS: 1.0000 mg | ORAL_TABLET | Freq: Three times a day (TID) | ORAL | 1 refills | Status: DC | PRN

## 2019-06-14 MED ORDER — ESCITALOPRAM OXALATE 20 MG PO TABS: 20.0000 mg | ORAL_TABLET | Freq: Every day | ORAL | 2 refills | Status: DC
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
## 2019-06-15 ENCOUNTER — Ambulatory Visit (HOSPITAL_COMMUNITY)
Admission: RE | Admit: 2019-06-15 | Discharge: 2019-06-15 | Disposition: A | Discharge status: Home / Self Care | Payer: Medicare Other | Source: Ambulatory Visit | Attending: Surgery | Admitting: Surgery

## 2019-06-15 ENCOUNTER — Other Ambulatory Visit: Payer: Self-pay

## 2019-06-18 ENCOUNTER — Other Ambulatory Visit (HOSPITAL_COMMUNITY): Payer: Self-pay | Admitting: Surgery

## 2019-07-03 ENCOUNTER — Other Ambulatory Visit: Payer: Self-pay

## 2019-07-03 ENCOUNTER — Other Ambulatory Visit (HOSPITAL_COMMUNITY): Payer: Medicare Other

## 2019-07-03 ENCOUNTER — Encounter: Payer: Medicare Other | Attending: Physician Assistant | Admitting: Dietician

## 2019-07-03 ENCOUNTER — Encounter: Payer: Self-pay | Admitting: Dietician

## 2019-07-03 DIAGNOSIS — E119 Type 2 diabetes mellitus without complications: Secondary | ICD-10-CM | POA: Insufficient documentation

## 2019-07-03 DIAGNOSIS — E669 Obesity, unspecified: Secondary | ICD-10-CM

## 2019-07-03 NOTE — Progress Notes (Addendum)
Nutrition Assessment for Bariatric Surgery Medical Nutrition Therapy   Patient was seen on 07/03/2019 for Pre-Operative Nutrition Assessment. Letter of approval faxed to [facility] bariatric surgery program coordinator on 07/03/2019.   Referral stated Supervised Weight Loss (SWL) visits needed: 6 *Pt has completed 1 of these with [place] at [place] in December 2020. Pt needs 5 more SWL visits prior to Pre-Op Class. Est surgery date July 2021.  Planned surgery: Lap Band to Sleeve  Pt expectation of surgery: to be able to play with grandchildren, not feel fatigued constantly, rid of headaches, improve energy, knee replacements   NUTRITION ASSESSMENT   Anthropometrics  Start weight at [facility]: 302 lbs (date: 07/03/2019) Height: 66 in BMI: 48.7 kg/m2    Biochemical Fasting BG: 167  Bedtime BG: 177 (2-3 hrs post-prandial)   Lifestyle & Dietary Hx Patient states she does not eat much pork or red meat. Patient states she is considered going vegetarian. Likes vegetables, fruits, and seafood. Patient states she does not drink with meals and avoids carbonated beverages, sugar-sweetened beverages, and alcohol. Currently drinks 64 ounces of water per day. States she often feels lightheaded because her blood sugar runs high.   24-Hr Dietary Recall First Meal: oatmeal + banana  Snack: 100 calorie high fiber snacks  Second Meal: cobb salad + raspberry vinaigrette  Snack: dates Third Meal: oysters + green vegetable  Snack: -  Beverages: water, black coffee, green tea  Estimated Energy Needs Calories: 1500-1600 Carbohydrate: 170-180g Protein: 94-100g Fat: 50-53g   NUTRITION DIAGNOSIS  Overweight/obesity ([place]-3.3) related to past poor dietary habits and physical inactivity as evidenced by patient w/ planned Lap Band to Sleeve Gastrectomy surgery following dietary guidelines for continued weight loss.    NUTRITION INTERVENTION  Nutrition counseling (C-1) and education (E-2) to facilitate  bariatric surgery goals.  Pre-Op Goals Reviewed with the Patient . Track food and beverage intake (pen and paper, MyFitness Pal, Baritastic app, etc.) . Make healthy food choices while monitoring portion sizes . Consume 3 meals per day or try to eat every 3-5 hours . Avoid concentrated sugars and fried foods . Keep sugar & fat in the single digits per serving on food labels . Practice CHEWING your food (aim for applesauce consistency) . Practice not drinking 15 minutes before, during, and 30 minutes after each meal and snack . Avoid all carbonated beverages (ex: soda, sparkling beverages)  . Limit caffeinated beverages (ex: coffee, tea, energy drinks) . Avoid all sugar-sweetened beverages (ex: regular soda, sports drinks)  . Avoid alcohol  . Aim for 64-100 ounces of FLUID daily (with at least half of fluid intake being plain water)  . Aim for at least 60-80 grams of PROTEIN daily . Look for a liquid protein source that contains ?15 g protein and ?5 g carbohydrate (ex: shakes, drinks, shots) . Make a list of non-food related activities . Physical activity is an important part of a healthy lifestyle so keep it moving! The goal is to reach 150 minutes of exercise per week, including cardiovascular and weight baring activity.  *Goals that are bolded indicate the pt would like to start working towards these  Handouts Provided Include  . Bariatric Surgery handouts (Nutrition Visits, Pre-Op Goals, Protein Shakes, Vitamins & Minerals)  Learning Style & Readiness for Change Teaching method utilized: Visual & Auditory  Demonstrated degree of understanding via: Teach Back    MONITORING & EVALUATION Dietary intake, weekly physical activity, body weight, and pre-op goals reached at next nutrition visit.  Next Steps Patient is to return to [facility] in 1 month for SWL Visit.

## 2019-07-03 NOTE — Patient Instructions (Signed)
   Use MyFitness Pal to track your food intake and see how many grams of protein you are eating per day.   Continue staying physically active throughout the week- remember the goal is 150 minutes per week. Good job of meeting this!

## 2019-07-05 ENCOUNTER — Ambulatory Visit (HOSPITAL_COMMUNITY)
Admission: RE | Admit: 2019-07-05 | Discharge: 2019-07-05 | Disposition: A | Discharge status: Home / Self Care | Payer: Medicare Other | Source: Ambulatory Visit | Attending: Surgery | Admitting: Surgery

## 2019-07-05 ENCOUNTER — Other Ambulatory Visit: Payer: Self-pay

## 2019-07-05 ENCOUNTER — Ambulatory Visit: Payer: Medicare Other | Admitting: Nutrition

## 2019-07-05 DIAGNOSIS — I4510 Unspecified right bundle-branch block: Secondary | ICD-10-CM | POA: Insufficient documentation

## 2019-07-17 ENCOUNTER — Encounter: Payer: Self-pay | Admitting: Dietician

## 2019-07-17 ENCOUNTER — Other Ambulatory Visit: Payer: Self-pay

## 2019-07-17 ENCOUNTER — Encounter: Payer: Medicare Other | Attending: Physician Assistant | Admitting: Dietician

## 2019-07-17 DIAGNOSIS — E669 Obesity, unspecified: Secondary | ICD-10-CM

## 2019-07-17 DIAGNOSIS — E119 Type 2 diabetes mellitus without complications: Secondary | ICD-10-CM | POA: Diagnosis present

## 2019-07-17 NOTE — Patient Instructions (Signed)
   Continue to check your blood sugar at least once per day.   Use the Diabetes Portion Plate handout to guide your meals- try to include protein, complex carbohydrates, and plenty of non-starchy vegetables.   Keep up the great work of going to the gym and being so active throughout the week!  Use the Balanced Snack sheet for snack ideas when you get hungry between meals/ in the evenings.

## 2019-07-17 NOTE — Progress Notes (Signed)
Supervised Weight Loss Visit Bariatric Nutrition Education Appt Start Time: 2:00pm     End Time: 2:20pm  Planned Surgery: LapBand to Sleeve   Pt Expectation of Surgery/ Goals: to be able to play with grandchildren, not feel fatigued constantly, rid of headaches, improve energy, knee replacements  2nd out of 6 SWL Appointments    NUTRITION ASSESSMENT  Anthropometrics  Start weight at [facility]: 302 lbs (date: 07/03/2019) Today's weight: 298.8 lbs BMI: 48.2 kg/m2    Biochemical  Fasting BG: 169  Bedtime BG: does not check anymore   Lifestyle & Dietary Hx States she checks her fasting BG daily, does not check after meals or at bedtime anymore. Has started going to the gym with a friend and hopes to start swimming soon as well. States she tracks her food and water intake via MyFitness Pal. Very low calorie intake (~1,000 kcals/day) and states she feels hungry in the evenings.   Estimated daily fluid intake: 32-48 oz Current average weekly physical activity: gym ~2 days/week  24-Hr Dietary Recall First Meal: pecan waffle + sugar-free syrup  Snack: - Second Meal: oatmeal  Snack: - Third Meal: baked chicken + green beans Snack: - Beverages: water, black coffee, green tea, unsweetened almond milk  Estimated Energy Needs Calories: 1500-1600 Carbohydrate: 170-180g Protein: 94-100g Fat: 50-53g   NUTRITION DIAGNOSIS  Overweight/obesity ([place]-3.3) related to past poor dietary habits and physical inactivity as evidenced by patient w/ planned Sleeve Gastrectomy surgery following dietary guidelines for continued weight loss.   NUTRITION INTERVENTION  Nutrition counseling (C-1) and education (E-2) to facilitate bariatric surgery goals.  Pre-Op Goals Progress & New Goals . Tracking food and beverage intake via MyFitness Pal  . Increasing physical activity throughout the week  . NEW: Incorporate snacks as needed, using the Balanced Snacks sheet for ideas  Handouts Provided Include    Diabetes Portion Plate   Balanced Snacks   Learning Style & Readiness for Change Teaching method utilized: Visual & Auditory  Demonstrated degree of understanding via: Teach Back  Barriers to learning/adherence to lifestyle change: None Identified    MONITORING & EVALUATION Dietary intake, weekly physical activity, body weight, and pre-op goals in 1 month.   Next Steps  Patient is to return to [facility] in 1 month for 3rd SWL visit.

## 2019-07-21 ENCOUNTER — Ambulatory Visit: Payer: Medicare Other | Attending: Internal Medicine

## 2019-07-21 DIAGNOSIS — Z23 Encounter for immunization: Secondary | ICD-10-CM

## 2019-07-21 NOTE — Progress Notes (Signed)
   Covid-19 Vaccination Clinic  Name:  [name]    MRN: [id number] DOB: [birth year]  07/21/2019  [name] was observed post Covid-19 immunization for 15 minutes without incident. She was provided with Vaccine Information Sheet and instruction to access the V-Safe system.   [name] was instructed to call 911 with any severe reactions post vaccine: Marland Kitchen Difficulty breathing  . Swelling of face and throat  . A fast heartbeat  . A bad rash all over body  . Dizziness and weakness   Immunizations Administered    Name Date Dose VIS Date Route   Moderna COVID-19 Vaccine 07/21/2019  2:30 PM 0.5 mL 04/10/2019 Intramuscular   Manufacturer: Moderna   Lot: [id number]   [id number]

## 2019-07-24 ENCOUNTER — Ambulatory Visit (INDEPENDENT_AMBULATORY_CARE_PROVIDER_SITE_OTHER): Payer: Medicare Other | Admitting: Orthopaedic Surgery

## 2019-07-24 ENCOUNTER — Other Ambulatory Visit: Payer: Self-pay

## 2019-07-24 ENCOUNTER — Encounter: Payer: Self-pay | Admitting: Orthopaedic Surgery

## 2019-07-24 VITALS — BP 158/95 | HR 75 | Temp 97.2°F | Ht 66.0 in | Wt 293.0 lb

## 2019-07-24 DIAGNOSIS — Z6841 Body Mass Index (BMI) 40.0 and over, adult: Secondary | ICD-10-CM | POA: Diagnosis not present

## 2019-07-24 DIAGNOSIS — M25561 Pain in right knee: Secondary | ICD-10-CM

## 2019-07-24 DIAGNOSIS — M25562 Pain in left knee: Secondary | ICD-10-CM | POA: Diagnosis not present

## 2019-07-24 DIAGNOSIS — G8929 Other chronic pain: Secondary | ICD-10-CM | POA: Diagnosis not present

## 2019-07-24 MED ORDER — OXYCODONE-ACETAMINOPHEN 5-325 MG PO TABS: 1.0000 | ORAL_TABLET | Freq: Four times a day (QID) | ORAL | 0 refills | Status: DC | PRN

## 2019-07-24 NOTE — Progress Notes (Signed)
[name]:[id number] JANSEN FAWBUSH, female DOB:21-May-1960, 59 y.o. [id number]  Chief Complaint  Patient presents with  . Knee Pain    Rt knee  . Medication Refill    Percocet     HPI  [name] is a 59 y.o. female who has chronic pain in both knees.  She has swelling and popping but no giving way, no redness, no new trauma.  Activity makes them worse.  She has lost another 9 pounds and is on a diet.     Body mass index is 47.29 kg/m.  The patient meets the AMA guidelines for Morbid (severe) obesity with a BMI > 40.0 and I have recommended weight loss.   ROS  Review of Systems  Constitutional:       Patient has Diabetes Mellitus. Patient has hypertension. Patient does not have COPD or shortness of breath. Patient has BMI > 35. Patient does not have current smoking history.  HENT: Negative for congestion.   Respiratory: Positive for cough. Negative for shortness of breath.   Endocrine: Positive for cold intolerance.  Musculoskeletal: Positive for arthralgias, gait problem and joint swelling.  Allergic/Immunologic: Positive for environmental allergies.  Neurological: Positive for headaches.  Psychiatric/Behavioral: The patient is nervous/anxious.     All other systems reviewed and are negative.  The following is a summary of the past history medically, past history surgically, known current medicines, social history and family history.  This information is gathered electronically by the computer from prior information and documentation.  I review this each visit and have found including this information at this point in the chart is beneficial and informative.    Past Medical History:  Diagnosis Date  . Adenomatous polyp 2009  . Anemia 2003 after surgery needed 1 unit blood  . Anxiety   . Arthritis   . Bipolar 1 disorder ([place])   . Constipation   . Depression   . Diabetes mellitus   . Diabetes mellitus, type II ([place])   . Diverticula of colon 2009  . Generalized  headaches   . GERD (gastroesophageal reflux disease)   . History of hiatal hernia    small  . Hypertension   . Migraine   . Obesity   . Pelvic floor dysfunction    abnormal anorectal manometry at [facility] in 2009  . Plantar fasciitis both feet  . Psoriasis   . Sleep apnea    cpap setting of 3.5    Past Surgical History:  Procedure Laterality Date  . ABDOMINAL HYSTERECTOMY     compete  . CATARACT EXTRACTION W/PHACO Right 02/15/2017   Procedure: CATARACT EXTRACTION PHACO AND INTRAOCULAR LENS PLACEMENT (IOC);  Surgeon: Rutherford Guys, MD;  Location: [facility];  Service: Ophthalmology;  Laterality: Right;  CDE: 4.18  . CATARACT EXTRACTION W/PHACO Left 03/01/2017   Procedure: CATARACT EXTRACTION PHACO AND INTRAOCULAR LENS PLACEMENT (IOC);  Surgeon: Rutherford Guys, MD;  Location: [facility];  Service: Ophthalmology;  Laterality: Left;  CDE: 2.56  . COLON RESECTION    03/30/2002   with end-colostomy and Hartmann's pouch  . COLON SURGERY     complicated diverticulitis requiring sigmoid resection with colostomy and subsequent takedown  . COLONOSCOPY  12/11/2007   Dr. Gala Romney- marginal prep, normal rectum pancolonic diverticula, adenomatous polyp  . COLONOSCOPY  08/28/2003      Wide open colonic anastomosis/Scattered diverticula noted throughout colon/ Small external hemorrhoids  . COLONOSCOPY  04/2012   [facility]: hyperplastic polyps, diverticulosis, ileocolonic anastomosis.  . COLONOSCOPY WITH PROPOFOL N/A 06/07/2016  Procedure: COLONOSCOPY WITH PROPOFOL;  Surgeon: Daneil Dolin, MD;  Location: [facility];  Service: Endoscopy;  Laterality: N/A;  7:30 am  . COLOSTOMY CLOSURE  07/10/2002  . ESOPHAGOGASTRODUODENOSCOPY (EGD) WITH PROPOFOL N/A 08/23/2016   Procedure: ESOPHAGOGASTRODUODENOSCOPY (EGD) WITH PROPOFOL;  Surgeon: Alphonsa Overall, MD;  Location: [facility];  Service: General;  Laterality: N/A;  . FLEXIBLE SIGMOIDOSCOPY  01/20/2012   [facility]: incomplete/attempted colonoscopy. Inadequate prep precluded  examination  . HEEL SPUR SURGERY Left 09/11/2013   both have been done  . KNEE ARTHROSCOPY  02/04/2004    left knee/partial medial meniscectomy.  Marland Kitchen KNEE SURGERY     3 arthroscopic  2 on left 1 on right  . LAPAROSCOPIC GASTRIC BANDING  2009  . LAPAROSCOPIC SALPINGOOPHERECTOMY  03/30/2002  . TUBAL LIGATION      Family History  Problem Relation Age of Onset  . Ovarian cancer Mother   . Heart disease Mother   . Anxiety disorder Mother   . Cirrhosis Father        deceased age 12  . Alcohol abuse Father   . Liver cancer Cousin        age 30, deceased  . Drug abuse Cousin   . ADD / ADHD Son   . Anxiety disorder Sister   . Dementia Maternal Grandfather   . Colon cancer Other        aunt, deceased age 59  . Breast cancer Other        aunt, deceased age 81  . Bipolar disorder Neg Hx   . Depression Neg Hx   . OCD Neg Hx   . Paranoid behavior Neg Hx   . Schizophrenia Neg Hx   . Seizures Neg Hx   . Sexual abuse Neg Hx   . Physical abuse Neg Hx     Social History Social History   Tobacco Use  . Smoking status: Never Smoker  . Smokeless tobacco: Never Used  Substance Use Topics  . Alcohol use: Not Currently    Comment: occasionally wine  . Drug use: No    Allergies  Allergen Reactions  . Bactrim Itching, Nausea And Vomiting and Other (See Comments)    Redness    Current Outpatient Medications  Medication Sig Dispense Refill  . ALPRAZolam (XANAX) 1 MG tablet Take 1 tablet (1 mg total) by mouth 3 (three) times daily as needed for anxiety. 270 tablet 1  . ARIPiprazole (ABILIFY) 10 MG tablet Take 1 tablet (10 mg total) by mouth at bedtime. 90 tablet 2  . cycloSPORINE (RESTASIS) 0.05 % ophthalmic emulsion Place 2 drops into both eyes 2 (two) times daily.     . diclofenac (VOLTAREN) 75 MG EC tablet Take 1 tablet (75 mg total) by mouth 2 (two) times daily. 90 tablet 3  . escitalopram (LEXAPRO) 20 MG tablet Take 1 tablet (20 mg total) by mouth daily. 90 tablet 2  . estradiol  (ESTRACE) 2 MG tablet Take 1 tablet (2 mg total) by mouth daily. 30 tablet 11  . exenatide (BYETTA) 5 MCG/0.02ML SOPN injection Inject 5 mcg into the skin 2 (two) times daily with a meal.    . fluticasone (FLONASE) 50 MCG/ACT nasal spray Place 2 sprays into the nose daily as needed for allergies.     Marland Kitchen gabapentin (NEURONTIN) 100 MG capsule TK ONE C PO QHS  11  . Insulin Glargine (LANTUS SOLOSTAR) 100 UNIT/ML Solostar Pen Inject 30 Units into the skin at bedtime. 5 pen 2  . Insulin  Pen Needle (B-D ULTRAFINE III SHORT PEN) 31G X 8 MM MISC 1 each by Does not apply route as directed. 100 each 3  . linaclotide (LINZESS) 290 MCG CAPS capsule Take 1 capsule (290 mcg total) by mouth daily. (Patient taking differently: Take 290 mcg by mouth daily as needed (constipation). ) 90 capsule 3  . lisinopril (PRINIVIL,ZESTRIL) 5 MG tablet     . loratadine (CLARITIN) 10 MG tablet Take 10 mg by mouth daily.    . meclizine (ANTIVERT) 25 MG tablet Take 25 mg by mouth daily as needed for dizziness.   2  . meloxicam (MOBIC) 15 MG tablet Take 1 tablet (15 mg total) by mouth daily. 30 tablet 5  . metFORMIN (GLUCOPHAGE) 500 MG tablet Take 500 mg by mouth 2 (two) times daily.     . methocarbamol (ROBAXIN) 500 MG tablet Take 500 mg by mouth 3 (three) times daily as needed for muscle spasms.     . Multiple Vitamin (MULTIVITAMIN PO) Take 1 tablet by mouth daily.     . ondansetron (ZOFRAN ODT) 4 MG disintegrating tablet Take 1 tablet (4 mg total) by mouth every 8 (eight) hours as needed for nausea or vomiting. 10 tablet 0  . oxyCODONE-acetaminophen (PERCOCET/ROXICET) 5-325 MG tablet Take 1 tablet by mouth every 6 (six) hours as needed for moderate pain or severe pain (Must last 30 days.). 120 tablet 0  . pantoprazole (PROTONIX) 40 MG tablet Take 1 tablet (40 mg total) by mouth 2 (two) times daily. 180 tablet 3  . polyethylene glycol (MIRALAX / GLYCOLAX) packet Take 17 g by mouth daily.     Marland Kitchen Propylene Glycol-Glycerin (SOOTHE)  0.6-0.6 % SOLN Place 1 drop into both eyes 2 (two) times daily.    . Psyllium (METAMUCIL PO) Take 1 each by mouth 2 (two) times daily.     . rizatriptan (MAXALT-MLT) 10 MG disintegrating tablet Take 10 mg by mouth as needed for migraine. May repeat in 2 hours if needed    . temazepam (RESTORIL) 30 MG capsule Take 1 capsule (30 mg total) by mouth at bedtime as needed for sleep. 90 capsule 2  . terconazole (TERAZOL 7) 0.4 % vaginal cream Place 1 applicator vaginally at bedtime. 45 g 0   No current facility-administered medications for this visit.     Physical Exam  Blood pressure (!) 158/95, pulse 75, temperature (!) 97.2 F (36.2 C), height 5\' 6"  (1.676 m), weight 293 lb (132.9 kg).  Constitutional: overall normal hygiene, normal nutrition, well developed, normal grooming, normal body habitus. Assistive device:none  Musculoskeletal: gait and station Limp left, muscle tone and strength are normal, no tremors or atrophy is present.  .  Neurological: coordination overall normal.  Deep tendon reflex/nerve stretch intact.  Sensation normal.  Cranial nerves II-XII intact.   Skin:   Normal overall no scars, lesions, ulcers or rashes. No psoriasis.  Psychiatric: Alert and oriented x 3.  Recent memory intact, remote memory unclear.  Normal mood and affect. Well groomed.  Good eye contact.  Cardiovascular: overall no swelling, no varicosities, no edema bilaterally, normal temperatures of the legs and arms, no clubbing, cyanosis and good capillary refill.  Lymphatic: palpation is normal.  Both knees are tender, have effusions, ROM right 0-105,left 0 to 100, limp left, NV intact.  All other systems reviewed and are negative   The patient has been educated about the nature of the problem(s) and counseled on treatment options.  The patient appeared to understand what I have  discussed and is in agreement with it.  Encounter Diagnoses  Name Primary?  . Chronic pain of right knee Yes  . Chronic  pain of left knee   . Body mass index 45.0-49.9, adult ([place])   . Morbid obesity ([place])     PLAN Call if any problems.  Precautions discussed.  Continue current medications.   Return to clinic 6 weeks  The patient has read and signed an Opioid Treatment Agreement which has been scanned and added to the medical record.  The patient understands the agreement and agrees to abide with it.  The patient has chronic pain that is being treated with an opioid which relieves the pain.  The patient understands potential complications with chronic opioid treatment.     I have reviewed the [place] web site prior to prescribing narcotic medicine for this patient.   Electronically Signed Sanjuana Kava, MD 3/16/20211:40 PM

## 2019-08-15 ENCOUNTER — Encounter: Payer: Medicare Other | Attending: Physician Assistant | Admitting: Dietician

## 2019-08-15 ENCOUNTER — Encounter: Payer: Self-pay | Admitting: Dietician

## 2019-08-15 ENCOUNTER — Other Ambulatory Visit: Payer: Self-pay

## 2019-08-15 DIAGNOSIS — E669 Obesity, unspecified: Secondary | ICD-10-CM

## 2019-08-15 DIAGNOSIS — E119 Type 2 diabetes mellitus without complications: Secondary | ICD-10-CM | POA: Diagnosis not present

## 2019-08-15 NOTE — Progress Notes (Signed)
Supervised Weight Loss Visit Bariatric Nutrition Education Appt Start Time: 2:20pm     End Time: 2:40pm  Planned Surgery: LapBand to Sleeve   Pt Expectation of Surgery/ Goals: to be able to play with grandchildren, not feel fatigued constantly, rid of headaches, improve energy, knee replacements  3rd out of 6 SWL Appointments    NUTRITION ASSESSMENT  Anthropometrics  Start weight at [facility]: 302 lbs (date: 07/03/2019) Today's weight: 302.2 lbs BMI: 48.2 kg/m2    Biochemical Labs Fasting BG: 139 (states this is the lowest it has been in several months)    Lifestyle & Dietary Hx Continues to track her food and water intake daily via MyFitness Pal, and is up to 1,300 kcals/day (from ~1,000kcals/day) now that she is including snacks like we discussed last time. Switched to unsweetened soy milk for more protein (vs almond.) Will have salads for lunch 3 days/week with skinny raspberry vinaigrette dressing. Likes seafood, with have it baked or broiled.   Estimated daily fluid intake: 64 oz Current average weekly physical activity: gym ~4 days/week  24-Hr Dietary Recall First Meal: 1 slice baked ham + 1 cup baked apples + hard boiled egg Snack: Skinny Pop popcorn + sharp cheese + Wheat Thin crackers  Second Meal: Asian salad Snack: 100 calorie pack of almonds  Third Meal: broiled shrimp + cocktail sauce + Asian steamed vegetables Snack: - Beverages: water, black coffee, green tea, unsweetened soy milk  Estimated Energy Needs Calories: 1500-1600 Carbohydrate: 170-180g Protein: 94-100g Fat: 50-53g   NUTRITION DIAGNOSIS  Overweight/obesity ([place]-3.3) related to past poor dietary habits and physical inactivity as evidenced by patient w/ planned Sleeve Gastrectomy surgery following dietary guidelines for continued weight loss.   NUTRITION INTERVENTION  Nutrition counseling (C-1) and education (E-2) to facilitate bariatric surgery goals.  Pre-Op Goals Progress & New Goals . Tracking  food and beverage intake via MyFitness Pal  . Physically active throughout the week (~4 days/week)  . Incorporating snacks as needed, using the Balanced Snacks sheet for ideas . Has increased water intake to at least 4 bottles of water per day . NEW: Chew food to applesauce consistency before swallowing  . NEW: Practice not drinking before, during, and after meals   Learning Style & Readiness for Change Teaching method utilized: Visual & Auditory  Demonstrated degree of understanding via: Teach Back  Barriers to learning/adherence to lifestyle change: None Identified    MONITORING & EVALUATION Dietary intake, weekly physical activity, body weight, and pre-op goals in 1 month.   Next Steps  Patient is to return to [facility] in 1 month for 4th SWL visit.

## 2019-08-15 NOTE — Patient Instructions (Signed)
Practice not drinking anything 15 minutes before, during, and 30 minutes after each meal/snack.   Chew eat bite until it is applesauce consistency before swallowing.

## 2019-08-22 ENCOUNTER — Ambulatory Visit: Payer: Medicare Other | Attending: Internal Medicine

## 2019-08-22 DIAGNOSIS — Z23 Encounter for immunization: Secondary | ICD-10-CM

## 2019-08-22 NOTE — Progress Notes (Signed)
   Covid-19 Vaccination Clinic  Name:  [name]    MRN: [id number] DOB: 1960/06/12  08/22/2019  [name] was observed post Covid-19 immunization for 15 minutes without incident. She was provided with Vaccine Information Sheet and instruction to access the V-Safe system.   [name] was instructed to call 911 with any severe reactions post vaccine: Marland Kitchen Difficulty breathing  . Swelling of face and throat  . A fast heartbeat  . A bad rash all over body  . Dizziness and weakness   Immunizations Administered    Name Date Dose VIS Date Route   Moderna COVID-19 Vaccine 08/22/2019  2:19 PM 0.5 mL 04/10/2019 Intramuscular   Manufacturer: Moderna   Lot: [id number]   [place]:[id number]

## 2019-09-03 ENCOUNTER — Telehealth: Payer: Self-pay | Admitting: "Endocrinology

## 2019-09-03 DIAGNOSIS — E1165 Type 2 diabetes mellitus with hyperglycemia: Secondary | ICD-10-CM

## 2019-09-03 MED ORDER — LANTUS SOLOSTAR 100 UNIT/ML ~~LOC~~ SOPN: 30.0000 [IU] | PEN_INJECTOR | Freq: Every day | SUBCUTANEOUS | 2 refills | Status: DC

## 2019-09-03 MED ORDER — BD PEN NEEDLE SHORT U/F 31G X 8 MM MISC: 1.0000 | 3 refills | Status: DC

## 2019-09-03 NOTE — Telephone Encounter (Signed)
Rx refills for lantus and pen needles sent to [facility].

## 2019-09-03 NOTE — Telephone Encounter (Signed)
Pt left a VM that we will be getting a RX for her lantus and needles. She said there is a refill at [facility] but it is too expensive. [facility] is [facility] because it will be cheaper on her.

## 2019-09-04 ENCOUNTER — Encounter: Payer: Self-pay | Admitting: Orthopaedic Surgery

## 2019-09-04 ENCOUNTER — Other Ambulatory Visit: Payer: Self-pay

## 2019-09-04 ENCOUNTER — Ambulatory Visit (INDEPENDENT_AMBULATORY_CARE_PROVIDER_SITE_OTHER): Payer: Medicare Other | Admitting: Orthopaedic Surgery

## 2019-09-04 VITALS — Ht 66.0 in | Wt 296.0 lb

## 2019-09-04 DIAGNOSIS — M25561 Pain in right knee: Secondary | ICD-10-CM | POA: Diagnosis not present

## 2019-09-04 DIAGNOSIS — Z6841 Body Mass Index (BMI) 40.0 and over, adult: Secondary | ICD-10-CM | POA: Diagnosis not present

## 2019-09-04 DIAGNOSIS — M25562 Pain in left knee: Secondary | ICD-10-CM

## 2019-09-04 DIAGNOSIS — E1165 Type 2 diabetes mellitus with hyperglycemia: Secondary | ICD-10-CM

## 2019-09-04 DIAGNOSIS — G8929 Other chronic pain: Secondary | ICD-10-CM | POA: Diagnosis not present

## 2019-09-04 MED ORDER — LANTUS SOLOSTAR 100 UNIT/ML ~~LOC~~ SOPN: 30.0000 [IU] | PEN_INJECTOR | Freq: Every day | SUBCUTANEOUS | 0 refills | Status: DC

## 2019-09-04 MED ORDER — OXYCODONE-ACETAMINOPHEN 5-325 MG PO TABS: 1.0000 | ORAL_TABLET | Freq: Four times a day (QID) | ORAL | 0 refills | Status: DC | PRN

## 2019-09-04 MED ORDER — BD PEN NEEDLE SHORT U/F 31G X 8 MM MISC
1.0000 | 3 refills | Status: AC
Start: 2019-09-04 — End: ?

## 2019-09-04 NOTE — Progress Notes (Signed)
PROCEDURE NOTE:  The patient requests injections of the right knee , verbal consent was obtained.  The right knee was prepped appropriately after time out was performed.   Sterile technique was observed and injection of 1 cc of Depo-Medrol 40 mg with several cc's of plain xylocaine. Anesthesia was provided by ethyl chloride and a 20-gauge needle was used to inject the knee area. The injection was tolerated well.  A band aid dressing was applied.  The patient was advised to apply ice later today and tomorrow to the injection sight as needed.  PROCEDURE NOTE:  The patient requests injections of the left knee , verbal consent was obtained.  The left knee was prepped appropriately after time out was performed.   Sterile technique was observed and injection of 1 cc of Depo-Medrol 40 mg with several cc's of plain xylocaine. Anesthesia was provided by ethyl chloride and a 20-gauge needle was used to inject the knee area. The injection was tolerated well.  A band aid dressing was applied.  The patient was advised to apply ice later today and tomorrow to the injection sight as needed.  I have reviewed the [place] web site prior to prescribing narcotic medicine for this patient.   The patient has read and signed an Opioid Treatment Agreement which has been scanned and added to the medical record.  The patient understands the agreement and agrees to abide with it.  The patient has chronic pain that is being treated with an opioid which relieves the pain.  The patient understands potential complications with chronic opioid treatment.   Call if any problem.  Precautions discussed.   Return in six weeks.  Electronically Signed Sanjuana Kava, MD 4/27/20211:57 PM

## 2019-09-05 ENCOUNTER — Encounter: Payer: Self-pay | Admitting: "Endocrinology

## 2019-09-05 ENCOUNTER — Ambulatory Visit (INDEPENDENT_AMBULATORY_CARE_PROVIDER_SITE_OTHER): Payer: Medicare Other | Admitting: "Endocrinology

## 2019-09-05 ENCOUNTER — Other Ambulatory Visit: Payer: Self-pay

## 2019-09-05 VITALS — BP 144/89 | HR 74 | Ht 66.0 in | Wt 297.0 lb

## 2019-09-05 DIAGNOSIS — E782 Mixed hyperlipidemia: Secondary | ICD-10-CM

## 2019-09-05 DIAGNOSIS — E1165 Type 2 diabetes mellitus with hyperglycemia: Secondary | ICD-10-CM | POA: Diagnosis not present

## 2019-09-05 DIAGNOSIS — I10 Essential (primary) hypertension: Secondary | ICD-10-CM

## 2019-09-05 LAB — POCT GLYCOSYLATED HEMOGLOBIN (HGB A1C): Hemoglobin A1C: 8.1 % — AB (ref 4.0–5.6)

## 2019-09-05 MED ORDER — LANTUS SOLOSTAR 100 UNIT/ML ~~LOC~~ SOPN: 40.0000 [IU] | PEN_INJECTOR | Freq: Every day | SUBCUTANEOUS | 1 refills | Status: DC

## 2019-09-05 MED ORDER — LANTUS SOLOSTAR 100 UNIT/ML ~~LOC~~ SOPN: 40.0000 [IU] | PEN_INJECTOR | Freq: Every day | SUBCUTANEOUS | 0 refills | Status: DC

## 2019-09-05 MED ORDER — LISINOPRIL 10 MG PO TABS: 10.0000 mg | ORAL_TABLET | Freq: Every day | ORAL | 1 refills | Status: DC

## 2019-09-05 NOTE — Progress Notes (Signed)
09/05/2019, 2:37 PM                                     Endocrinology follow-up note    Subjective:    Patient ID: [name], female    DOB: March 21, 1961.  TAJAE [name] is being seen in follow up after she was seen in consultation for management of currently uncontrolled symptomatic diabetes requested by  Sharilyn Sites, MD.   Past Medical History:  Diagnosis Date  . Adenomatous polyp 2009  . Anemia 2003 after surgery needed 1 unit blood  . Anxiety   . Arthritis   . Bipolar 1 disorder ([place])   . Constipation   . Depression   . Diabetes mellitus   . Diabetes mellitus, type II ([place])   . Diverticula of colon 2009  . Generalized headaches   . GERD (gastroesophageal reflux disease)   . History of hiatal hernia    small  . Hypertension   . Migraine   . Obesity   . Pelvic floor dysfunction    abnormal anorectal manometry at [facility] in 2009  . Plantar fasciitis both feet  . Psoriasis   . Sleep apnea    cpap setting of 3.5    Past Surgical History:  Procedure Laterality Date  . ABDOMINAL HYSTERECTOMY     compete  . CATARACT EXTRACTION W/PHACO Right 02/15/2017   Procedure: CATARACT EXTRACTION PHACO AND INTRAOCULAR LENS PLACEMENT (IOC);  Surgeon: Rutherford Guys, MD;  Location: [facility];  Service: Ophthalmology;  Laterality: Right;  CDE: 4.18  . CATARACT EXTRACTION W/PHACO Left 03/01/2017   Procedure: CATARACT EXTRACTION PHACO AND INTRAOCULAR LENS PLACEMENT (IOC);  Surgeon: Rutherford Guys, MD;  Location: [facility];  Service: Ophthalmology;  Laterality: Left;  CDE: 2.56  . COLON RESECTION    03/30/2002   with end-colostomy and Hartmann's pouch  . COLON SURGERY     complicated diverticulitis requiring sigmoid resection with colostomy and subsequent takedown  . COLONOSCOPY  12/11/2007   Dr. Gala Romney- marginal prep, normal rectum pancolonic diverticula, adenomatous polyp  . COLONOSCOPY  08/28/2003      Wide open colonic anastomosis/Scattered diverticula noted  throughout colon/ Small external hemorrhoids  . COLONOSCOPY  04/2012   [facility]: hyperplastic polyps, diverticulosis, ileocolonic anastomosis.  . COLONOSCOPY WITH PROPOFOL N/A 06/07/2016   Procedure: COLONOSCOPY WITH PROPOFOL;  Surgeon: Daneil Dolin, MD;  Location: [facility];  Service: Endoscopy;  Laterality: N/A;  7:30 am  . COLOSTOMY CLOSURE  07/10/2002  . ESOPHAGOGASTRODUODENOSCOPY (EGD) WITH PROPOFOL N/A 08/23/2016   Procedure: ESOPHAGOGASTRODUODENOSCOPY (EGD) WITH PROPOFOL;  Surgeon: Alphonsa Overall, MD;  Location: [facility];  Service: General;  Laterality: N/A;  . FLEXIBLE SIGMOIDOSCOPY  01/20/2012   [facility]: incomplete/attempted colonoscopy. Inadequate prep precluded examination  . HEEL SPUR SURGERY Left 09/11/2013   both have been done  . KNEE ARTHROSCOPY  02/04/2004    left knee/partial medial meniscectomy.  Marland Kitchen KNEE SURGERY     3 arthroscopic  2 on left 1 on right  . LAPAROSCOPIC GASTRIC BANDING  2009  . LAPAROSCOPIC SALPINGOOPHERECTOMY  03/30/2002  . TUBAL LIGATION      Social History   Socioeconomic History  . Marital status: Married    Spouse name: Not on file  . Number of children: 2  . Years of education: college  . Highest education level: Not on file  Occupational History  . Occupation:  student at [employer]: UNEMPLOYED    Employer: DISABLED  Tobacco Use  . Smoking status: Never Smoker  . Smokeless tobacco: Never Used  Substance and Sexual Activity  . Alcohol use: Not Currently    Comment: occasionally wine  . Drug use: No  . Sexual activity: Not Currently    Birth control/protection: Surgical  Other Topics Concern  . Not on file  Social History Narrative  . Not on file   Social Determinants of Health   Financial Resource Strain:   . Difficulty of Paying Living Expenses:   Food Insecurity:   . Worried About Charity fundraiser in the Last Year:   . Arboriculturist in the Last Year:   Transportation Needs:   . Film/video editor (Medical):    Marland Kitchen Lack of Transportation (Non-Medical):   Physical Activity:   . Days of Exercise per Week:   . Minutes of Exercise per Session:   Stress:   . Feeling of Stress :   Social Connections:   . Frequency of Communication with Friends and Family:   . Frequency of Social Gatherings with Friends and Family:   . Attends Religious Services:   . Active Member of Clubs or Organizations:   . Attends Archivist Meetings:   Marland Kitchen Marital Status:     Family History  Problem Relation Age of Onset  . Ovarian cancer Mother   . Heart disease Mother   . Anxiety disorder Mother   . Cirrhosis Father        deceased age 24  . Alcohol abuse Father   . Liver cancer Cousin        age 33, deceased  . Drug abuse Cousin   . ADD / ADHD Son   . Anxiety disorder Sister   . Dementia Maternal Grandfather   . Colon cancer Other        aunt, deceased age 44  . Breast cancer Other        aunt, deceased age 87  . Bipolar disorder Neg Hx   . Depression Neg Hx   . OCD Neg Hx   . Paranoid behavior Neg Hx   . Schizophrenia Neg Hx   . Seizures Neg Hx   . Sexual abuse Neg Hx   . Physical abuse Neg Hx     Outpatient Encounter Medications as of 09/05/2019  Medication Sig  . ALPRAZolam (XANAX) 1 MG tablet Take 1 tablet (1 mg total) by mouth 3 (three) times daily as needed for anxiety.  . ARIPiprazole (ABILIFY) 10 MG tablet Take 1 tablet (10 mg total) by mouth at bedtime.  . cycloSPORINE (RESTASIS) 0.05 % ophthalmic emulsion Place 2 drops into both eyes 2 (two) times daily.   . diclofenac (VOLTAREN) 75 MG EC tablet Take 1 tablet (75 mg total) by mouth 2 (two) times daily.  Marland Kitchen escitalopram (LEXAPRO) 20 MG tablet Take 1 tablet (20 mg total) by mouth daily.  Marland Kitchen estradiol (ESTRACE) 2 MG tablet Take 1 tablet (2 mg total) by mouth daily.  Marland Kitchen exenatide (BYETTA) 5 MCG/0.02ML SOPN injection Inject 5 mcg into the skin 2 (two) times daily with a meal.  . fluticasone (FLONASE) 50 MCG/ACT nasal spray Place 2 sprays into  the nose daily as needed for allergies.   Marland Kitchen gabapentin (NEURONTIN) 100 MG capsule TK ONE C PO QHS  . Insulin Pen Needle (B-D ULTRAFINE III SHORT PEN) 31G X 8 MM MISC 1 each by Does not  apply route as directed.  . linaclotide (LINZESS) 290 MCG CAPS capsule Take 1 capsule (290 mcg total) by mouth daily. (Patient taking differently: Take 290 mcg by mouth daily as needed (constipation). )  . lisinopril (PRINIVIL,ZESTRIL) 5 MG tablet   . loratadine (CLARITIN) 10 MG tablet Take 10 mg by mouth daily.  . meclizine (ANTIVERT) 25 MG tablet Take 25 mg by mouth daily as needed for dizziness.   . meloxicam (MOBIC) 15 MG tablet Take 1 tablet (15 mg total) by mouth daily.  . metFORMIN (GLUCOPHAGE) 1000 MG tablet Take 1,000 mg by mouth 2 (two) times daily.  . methocarbamol (ROBAXIN) 500 MG tablet Take 500 mg by mouth 3 (three) times daily as needed for muscle spasms.   . Multiple Vitamin (MULTIVITAMIN PO) Take 1 tablet by mouth daily.   . ondansetron (ZOFRAN ODT) 4 MG disintegrating tablet Take 1 tablet (4 mg total) by mouth every 8 (eight) hours as needed for nausea or vomiting.  Marland Kitchen oxyCODONE-acetaminophen (PERCOCET/ROXICET) 5-325 MG tablet Take 1 tablet by mouth every 6 (six) hours as needed for moderate pain or severe pain (Must last 30 days.).  Marland Kitchen pantoprazole (PROTONIX) 40 MG tablet Take 1 tablet (40 mg total) by mouth 2 (two) times daily.  . polyethylene glycol (MIRALAX / GLYCOLAX) packet Take 17 g by mouth daily.   Marland Kitchen Propylene Glycol-Glycerin (SOOTHE) 0.6-0.6 % SOLN Place 1 drop into both eyes 2 (two) times daily.  . Psyllium (METAMUCIL PO) Take 1 each by mouth 2 (two) times daily.   . rizatriptan (MAXALT-MLT) 10 MG disintegrating tablet Take 10 mg by mouth as needed for migraine. May repeat in 2 hours if needed  . temazepam (RESTORIL) 30 MG capsule Take 1 capsule (30 mg total) by mouth at bedtime as needed for sleep.  Marland Kitchen terconazole (TERAZOL 7) 0.4 % vaginal cream Place 1 applicator vaginally at bedtime.  .  [DISCONTINUED] insulin glargine (LANTUS SOLOSTAR) 100 UNIT/ML Solostar Pen Inject 30 Units into the skin at bedtime.  . [DISCONTINUED] insulin glargine (LANTUS SOLOSTAR) 100 UNIT/ML Solostar Pen Inject 40 Units into the skin at bedtime.  . [DISCONTINUED] metFORMIN (GLUCOPHAGE) 500 MG tablet Take 500 mg by mouth 2 (two) times daily.    No facility-administered encounter medications on file as of 09/05/2019.    ALLERGIES: Allergies  Allergen Reactions  . Bactrim Itching, Nausea And Vomiting and Other (See Comments)    Redness    VACCINATION STATUS: Immunization History  Administered Date(s) Administered  . Moderna SARS-COVID-2 Vaccination 07/21/2019, 08/22/2019    Diabetes She presents for her follow-up diabetic visit. She has type 2 diabetes mellitus. Onset time: She was diagnosed at approximate age of 48 years. Her disease course has been improving. There are no hypoglycemic associated symptoms. Pertinent negatives for hypoglycemia include no confusion, headaches, pallor or seizures. Associated symptoms include fatigue. Pertinent negatives for diabetes include no chest pain, no polydipsia, no polyphagia and no polyuria. There are no hypoglycemic complications. Symptoms are improving. Diabetic complications include heart disease. Risk factors for coronary artery disease include dyslipidemia, family history, diabetes mellitus, hypertension, obesity, sedentary lifestyle and post-menopausal. Current diabetic treatments: She is currently on Byetta 5 mg twice daily, Metformin 1000 mg p.o. twice daily. Her weight is fluctuating minimally (She has previous history of lap band, recently increasing her weight to presurgical levels of 300 pounds.). She is following a generally unhealthy diet. When asked about meal planning, she reported none. She has not had a previous visit with a dietitian. She never participates  in exercise. Her home blood glucose trend is fluctuating minimally. Her breakfast blood  glucose range is generally 140-180 mg/dl. Her lunch blood glucose range is generally 180-200 mg/dl. Her dinner blood glucose range is generally 180-200 mg/dl. Her bedtime blood glucose range is generally 180-200 mg/dl. Her overall blood glucose range is 180-200 mg/dl. (She presents with improving, however still above target 5055 point-of-care 1.1 improving from 8.9.  She denies hypoglycemia.    ) An ACE inhibitor/angiotensin II receptor blocker is being taken. Eye exam is current.  Hypertension This is a chronic problem. The current episode started more than 1 year ago. The problem is uncontrolled. Pertinent negatives include no chest pain, headaches, palpitations or shortness of breath. Risk factors for coronary artery disease include diabetes mellitus, obesity, sedentary lifestyle and post-menopausal state. Past treatments include ACE inhibitors.    Review of systems: Limited as above. Objective:    BP (!) 144/89   Pulse 74   Ht 5\' 6"  (1.676 m)   Wt 297 lb (134.7 kg)   BMI 47.94 kg/m   Wt Readings from Last 3 Encounters:  09/05/19 297 lb (134.7 kg)  09/04/19 296 lb (134.3 kg)  08/15/19 (!) 302 lb 3.2 oz (137.1 kg)     Physical Exam- Limited  Constitutional:  Body mass index is 47.94 kg/m. , not in acute distress, normal state of mind Eyes:  EOMI, no exophthalmos Neck: Supple Thyroid: No gross goiter Respiratory: Adequate breathing efforts Musculoskeletal: no gross deformities, strength intact in all four extremities, no gross restriction of joint movements Skin:  no rashes, no hyperemia Neurological: no tremor with outstretched hands,    CMP     Component Value Date/Time   NA 138 04/02/2019 1152   K 4.1 04/02/2019 1152   CL 102 04/02/2019 1152   CO2 24 04/02/2019 1152   GLUCOSE 147 (H) 04/02/2019 1152   BUN 19 04/17/2019 0000   CREATININE 0.7 04/17/2019 0000   CREATININE 0.60 04/02/2019 1152   CREATININE 0.74 03/23/2011 0400   CALCIUM 9.3 04/02/2019 1152   PROT 8.0  04/02/2019 1351   ALBUMIN 4.1 04/02/2019 1351   AST 31 04/02/2019 1351   ALT 39 04/02/2019 1351   ALKPHOS 89 04/02/2019 1351   BILITOT 0.6 04/02/2019 1351   GFRNONAA >60 04/02/2019 1152   GFRAA >60 04/02/2019 1152   Recent Results (from the past 2160 hour(s))  HgB A1c     Status: Abnormal   Collection Time: 09/05/19  1:29 PM  Result Value Ref Range   Hemoglobin A1C 8.1 (A) 4.0 - 5.6 %   HbA1c POC (<> result, manual entry)     HbA1c, POC (prediabetic range)     HbA1c, POC (controlled diabetic range)       Assessment & Plan:   1. Uncontrolled type 2 diabetes mellitus with hyperglycemia ([place])  - DEZEREA QUI has currently uncontrolled symptomatic type 2 DM since  59 years of age.  She presents with improving, however still above target 5055 point-of-care 1.1 improving from 8.9.  She denies hypoglycemia.   - Recent labs reviewed. - I had a long discussion with her about the progressive nature of diabetes and the pathology behind its complications. -her diabetes is complicated by morbid obesity, sedentary life, hypertension and she remains at a high risk for more acute and chronic complications which include CAD, CVA, CKD, retinopathy, and neuropathy. These are all discussed in detail with her.  - I have counseled her on diet  and weight management  by adopting a carbohydrate restricted/protein rich diet. Patient is encouraged to switch to  unprocessed or minimally processed     complex starch and increased protein intake (animal or plant source), fruits, and vegetables. -  she is advised to stick to a routine mealtimes to eat 3 meals  a day and avoid unnecessary snacks ( to snack only to correct hypoglycemia).   - she  admits there is a room for improvement in her diet and drink choices. -  Suggestion is made for her to avoid simple carbohydrates  from her diet including Cakes, Sweet Desserts / Pastries, Ice Cream, Soda (diet and regular), Sweet Tea, Candies, Chips, Cookies,  Sweet Pastries,  Store Bought Juices, Alcohol in Excess of  1-2 drinks a day, Artificial Sweeteners, Coffee Creamer, and "Sugar-free" Products. This will help patient to have stable blood glucose profile and potentially avoid unintended weight gain.  - she will be scheduled with Jearld Fenton, RDN, CDE for diabetes education.  - I have approached her with the following individualized plan to manage  her diabetes and patient agrees:   - she will continue to need basal insulin in order for her to achieve and maintain control of diabetes to target.   -She is becoming more comfortable taking her insulin injection.  Based on her presentation with near target glycemic profile, she will not need prandial insulin for now.  She is advised to increase her Lantus to 40 units nightly associated with monitoring of blood glucose twice a day - before breakfast and at bed time.  - she is warned not to take insulin without proper monitoring per orders.  - she is encouraged to call clinic for blood glucose levels less than 70 or above 300 mg /dl. - she is advised to continue Byetta 5 mcg subcutaneously twice daily, therapeutically suitable for patient . -She is advised to continue Metformin 1000 mg p.o. twice daily--daily after breakfast and supper.  - Specific targets for  A1c;  LDL, HDL,  and Triglycerides were discussed with the patient.   2) Blood Pressure /Hypertension: Her blood pressure is not controlled to target.   she is advised to continue her current medications including lisinopril 5 mg p.o. daily with breakfast , lisinopril will be increased to  10 mg for next refill.  3) Lipids/Hyperlipidemia:     -Her recent lipid panel showed uncontrolled LDL of 91.  She will be considered for fasting lipid panel before her next visit.  4)  Weight/Diet: Her BMI is [id number]-   clearly complicating her diabetes care.   she is  a candidate for weight loss. I discussed with her the fact that loss of 5 - 10% of her   current body weight will have the most impact on her diabetes management.  Exercise, and detailed carbohydrates information provided  -  detailed on discharge instructions. -She is status post previous lap band with successful loss of weight, recently regained back to her presurgical level.  She is undergoing evaluation for sleeve gastrectomy.  Is encouraged to pursue this treatment measure.  5) Chronic Care/Health Maintenance:  -she  is on ACEI/ARB and  is encouraged to initiate and continue to follow up with Ophthalmology, Dentist,  Podiatrist at least yearly or according to recommendations, and advised to  stay away from smoking. I have recommended yearly flu vaccine and pneumonia vaccine at least every 5 years; moderate intensity exercise for up to 150 minutes weekly; and  sleep for at least 7 hours a day.  -  she is  advised to maintain close follow up with Sharilyn Sites, MD for primary care needs, as well as her other providers for optimal and coordinated care.  - Time spent on this patient care encounter:  35 min, of which > 50% was spent in  counseling and the rest reviewing her blood glucose logs , discussing her hypoglycemia and hyperglycemia episodes, reviewing her current and  previous labs / studies  ( including abstraction from other facilities) and medications  doses and developing a  long term treatment plan and documenting her care.   Please refer to Patient Instructions for Blood Glucose Monitoring and Insulin/Medications Dosing Guide"  in media tab for additional information. Please  also refer to " Patient Self Inventory" in the Media  tab for reviewed elements of pertinent patient history.  Francesca Jewett participated in the discussions, expressed understanding, and voiced agreement with the above plans.  All questions were answered to her satisfaction. she is encouraged to contact clinic should she have any questions or concerns prior to her return visit.    Follow up plan: -  Return in about 3 months (around 12/05/2019) for Bring Meter and Logs- A1c in Office, Follow up with Pre-visit Labs.  Glade Lloyd, MD [facility] [address] [place], [place] [address] Phone: [phone]  Fax: [phone]    09/05/2019, 2:37 PM  This note was partially dictated with voice recognition software. Similar sounding words can be transcribed inadequately or may not  be corrected upon review.

## 2019-09-05 NOTE — Patient Instructions (Signed)

## 2019-09-14 ENCOUNTER — Encounter: Payer: Medicare Other | Attending: Physician Assistant | Admitting: Dietician

## 2019-09-14 ENCOUNTER — Encounter: Payer: Self-pay | Admitting: Dietician

## 2019-09-14 ENCOUNTER — Other Ambulatory Visit: Payer: Self-pay

## 2019-09-14 DIAGNOSIS — E669 Obesity, unspecified: Secondary | ICD-10-CM

## 2019-09-14 DIAGNOSIS — E119 Type 2 diabetes mellitus without complications: Secondary | ICD-10-CM | POA: Insufficient documentation

## 2019-09-14 NOTE — Progress Notes (Signed)
Supervised Weight Loss Visit Bariatric Nutrition Education  Planned Surgery: LapBand to Sleeve   Pt Expectation of Surgery/ Goals: to be able to play with grandchildren, not feel fatigued constantly, rid of headaches, improve energy, knee replacements  4th out of 6 SWL Appointments    NUTRITION ASSESSMENT  Anthropometrics  Start weight at [facility]: 302 lbs (date: 07/03/2019) Today's weight: 295 lbs BMI: 47.6 kg/m2    [facility] A1c 8.1% (down from 8.9%)   Lifestyle & Dietary Hx Pt arrives having lost 7 lbs over the past month. Continues to track her food and water intake daily via MyFitness Pal, states she wants to stick to ~1,300 kcals/day. States she sometimes still feels hungry, so we discussed eating more protein and non-starchy vegetables when still hungry. Working on chewing food thoroughly, now doesn't finish eating before husband as she typically did before, states it takes ~30 minutes to eat a meal. Does not drink fluids with meals. Will eat seafood ~3x/week. States she is now taking probiotic gummies which she thinks is helping regulate her bowel movements, and continues to take Miralax 1x/day. Fluid intake has increased even more to up to 6 water bottles per day.    Estimated daily fluid intake: 64-96 oz Current average weekly physical activity: gym ~4 days/week  24-Hr Dietary Recall First Meal: 2 slices [place] bacon + 2 scrambled eggs + 1 slice wheat toast + sugar-free blackberry jam Snack: Skinny Pop popcorn + cheese  Second Meal: broiled shrimp + [place] sausage + boiled potatoes + 1/2 corn on the cob  Snack: Activia yogurt  Third Meal: homemade keto chicken salad + Wheat Thin crackers + boiled egg   Snack: Slim Jim Beverages: water, black coffee, green tea, unsweetened soy milk  Estimated Energy Needs Calories: 1500-1600 Carbohydrate: 170-180g Protein: 94-100g Fat: 50-53g   NUTRITION DIAGNOSIS  Overweight/obesity ([place]-3.3) related to past poor dietary habits  and physical inactivity as evidenced by patient w/ planned Sleeve Gastrectomy surgery following dietary guidelines for continued weight loss.   NUTRITION INTERVENTION  Nutrition counseling (C-1) and education (E-2) to facilitate bariatric surgery goals.  Pre-Op Goals Progress & New Goals . Tracking food and beverage intake via MyFitness Pal  . Physically active throughout the week (~4 days/week)  . Incorporating snacks as needed, using the Balanced Snacks sheet for ideas . Has increased water intake to 4-6 bottles of water per day . Chewing food to applesauce consistency before swallowing  . Does not drink before, during, and after meals   Learning Style & Readiness for Change Teaching method utilized: Visual & Auditory  Demonstrated degree of understanding via: Teach Back  Barriers to learning/adherence to lifestyle change: None Identified    MONITORING & EVALUATION Dietary intake, weekly physical activity, body weight, and pre-op goals in 1 month.   Next Steps  Patient is to return to [facility] in 1 month for 5th SWL visit.

## 2019-09-17 ENCOUNTER — Telehealth (INDEPENDENT_AMBULATORY_CARE_PROVIDER_SITE_OTHER): Payer: Medicare Other | Admitting: Psychiatry

## 2019-09-17 ENCOUNTER — Encounter (HOSPITAL_COMMUNITY): Payer: Self-pay | Admitting: Psychiatry

## 2019-09-17 ENCOUNTER — Other Ambulatory Visit: Payer: Self-pay

## 2019-09-17 ENCOUNTER — Other Ambulatory Visit: Payer: Self-pay | Admitting: Physician Assistant

## 2019-09-17 ENCOUNTER — Other Ambulatory Visit (HOSPITAL_COMMUNITY): Payer: Self-pay | Admitting: Physician Assistant

## 2019-09-17 DIAGNOSIS — K5792 Diverticulitis of intestine, part unspecified, without perforation or abscess without bleeding: Secondary | ICD-10-CM

## 2019-09-17 DIAGNOSIS — F3162 Bipolar disorder, current episode mixed, moderate: Secondary | ICD-10-CM | POA: Diagnosis not present

## 2019-09-17 MED ORDER — ARIPIPRAZOLE 10 MG PO TABS: 10.0000 mg | ORAL_TABLET | Freq: Every day | ORAL | 2 refills | Status: DC

## 2019-09-17 MED ORDER — ALPRAZOLAM 1 MG PO TABS: 1.0000 mg | ORAL_TABLET | Freq: Three times a day (TID) | ORAL | 1 refills | Status: DC | PRN

## 2019-09-17 MED ORDER — ESCITALOPRAM OXALATE 20 MG PO TABS: 20.0000 mg | ORAL_TABLET | Freq: Every day | ORAL | 2 refills | Status: DC

## 2019-09-17 MED ORDER — TEMAZEPAM 30 MG PO CAPS: 30.0000 mg | ORAL_CAPSULE | Freq: Every evening | ORAL | 2 refills | Status: DC | PRN
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
## 2019-09-25 ENCOUNTER — Other Ambulatory Visit: Payer: Self-pay | Admitting: Otolaryngology

## 2019-09-25 ENCOUNTER — Other Ambulatory Visit (HOSPITAL_COMMUNITY): Payer: Self-pay | Admitting: Otolaryngology

## 2019-09-25 DIAGNOSIS — J320 Chronic maxillary sinusitis: Secondary | ICD-10-CM

## 2019-10-01 ENCOUNTER — Other Ambulatory Visit: Payer: Self-pay

## 2019-10-01 ENCOUNTER — Ambulatory Visit (HOSPITAL_COMMUNITY)
Admission: RE | Admit: 2019-10-01 | Discharge: 2019-10-01 | Disposition: A | Discharge status: Home / Self Care | Payer: Medicare Other | Source: Ambulatory Visit | Attending: Physician Assistant | Admitting: Physician Assistant

## 2019-10-01 DIAGNOSIS — K5792 Diverticulitis of intestine, part unspecified, without perforation or abscess without bleeding: Secondary | ICD-10-CM | POA: Diagnosis present

## 2019-10-11 ENCOUNTER — Telehealth: Payer: Self-pay | Admitting: Orthopaedic Surgery

## 2019-10-11 ENCOUNTER — Ambulatory Visit: Payer: Medicare Other | Admitting: Dietician

## 2019-10-11 NOTE — Telephone Encounter (Signed)
Patient called, confirmed her appointment via voice message and mentioned another appointment. I called back to patient to further discuss; left message on her voice mail.

## 2019-10-12 ENCOUNTER — Ambulatory Visit (INDEPENDENT_AMBULATORY_CARE_PROVIDER_SITE_OTHER): Payer: Medicare Other | Admitting: Psychology

## 2019-10-15 ENCOUNTER — Other Ambulatory Visit: Payer: Self-pay

## 2019-10-15 ENCOUNTER — Encounter: Payer: Self-pay | Admitting: Dietician

## 2019-10-15 ENCOUNTER — Encounter: Payer: Medicare Other | Attending: Physician Assistant | Admitting: Dietician

## 2019-10-15 DIAGNOSIS — E119 Type 2 diabetes mellitus without complications: Secondary | ICD-10-CM | POA: Diagnosis not present

## 2019-10-15 DIAGNOSIS — E669 Obesity, unspecified: Secondary | ICD-10-CM

## 2019-10-15 NOTE — Progress Notes (Signed)
Supervised Weight Loss Visit Bariatric Nutrition Education  Planned Surgery: LapBand to Sleeve   Pt Expectation of Surgery/ Goals: to be able to play with grandchildren, not feel fatigued constantly, rid of headaches, improve energy, knee replacements  5th out of 6 SWL Appointments    NUTRITION ASSESSMENT  Anthropometrics  Start weight at [facility]: 302 lbs (date: 07/03/2019) Today's weight: 301.5 lbs BMI: 48.6 kg/m2    Biochemical Labs A1c 8.1% (down from 8.9%)   Lifestyle & Dietary Hx Continues to do well with chewing foods thoroughly and not drinking with meals. Eats a lot of fruit, yogurt, and snacks. Other snacks may be Activia yogurt, peanut butter, almond butter with celery, popcorn, dehydrated okra, and protein shakes. Eats frequently throughout the day.   Estimated daily fluid intake: 64-96 oz Current average weekly physical activity: gym ~4 days/week  24-Hr Dietary Recall First Meal: banana + greek yogurt + strawberries  Snack: 90 kcal greek yogurt bar  Second Meal: grilled chicken + orange "fluff" (ff cool whip + sugar free pudding + fruit)  Snack: protein shake   Third Meal: grilled meat (chicken/turkey/seafood) + green vegetable   Snack: 100 kcal nuts  Beverages: water, black coffee, hot green tea, unsweetened soy milk  Estimated Energy Needs Calories: 1500-1600 Carbohydrate: 170-180g Protein: 94-100g Fat: 50-53g   NUTRITION DIAGNOSIS  Overweight/obesity ([place]-3.3) related to past poor dietary habits and physical inactivity as evidenced by patient w/ planned Sleeve Gastrectomy surgery following dietary guidelines for continued weight loss.   NUTRITION INTERVENTION  Nutrition counseling (C-1) and education (E-2) to facilitate bariatric surgery goals.  Pre-Op Goals Progress & New Goals . Tracking food and beverage intake via MyFitness Pal  . Physically active throughout the week (~4 days/week)  . Incorporating snacks as needed, using the Balanced Snacks sheet for  ideas . Has increased water intake to 4-6 bottles of water per day . Chewing food to applesauce consistency before swallowing  . Does not drink before, during, and after meals   Learning Style & Readiness for Change Teaching method utilized: Visual & Auditory  Demonstrated degree of understanding via: Teach Back  Barriers to learning/adherence to lifestyle change: None Identified    MONITORING & EVALUATION Dietary intake, weekly physical activity, body weight, and pre-op goals in 1 month.   Next Steps  Patient is to return to [facility] in 1 month for 6th SWL visit.

## 2019-10-16 ENCOUNTER — Encounter: Payer: Self-pay | Admitting: Orthopaedic Surgery

## 2019-10-16 ENCOUNTER — Ambulatory Visit (INDEPENDENT_AMBULATORY_CARE_PROVIDER_SITE_OTHER): Payer: Medicare Other | Admitting: Orthopaedic Surgery

## 2019-10-16 DIAGNOSIS — Z6841 Body Mass Index (BMI) 40.0 and over, adult: Secondary | ICD-10-CM | POA: Diagnosis not present

## 2019-10-16 DIAGNOSIS — M25561 Pain in right knee: Secondary | ICD-10-CM

## 2019-10-16 DIAGNOSIS — G8929 Other chronic pain: Secondary | ICD-10-CM

## 2019-10-16 MED ORDER — OXYCODONE-ACETAMINOPHEN 5-325 MG PO TABS: 1.0000 | ORAL_TABLET | Freq: Four times a day (QID) | ORAL | 0 refills | Status: DC | PRN

## 2019-10-16 NOTE — Progress Notes (Signed)
PROCEDURE NOTE:  The patient requests injections of the right knee , verbal consent was obtained.  The right knee was prepped appropriately after time out was performed.   Sterile technique was observed and injection of 1 cc of Depo-Medrol 40 mg with several cc's of plain xylocaine. Anesthesia was provided by ethyl chloride and a 20-gauge needle was used to inject the knee area. The injection was tolerated well.  A band aid dressing was applied.  The patient was advised to apply ice later today and tomorrow to the injection sight as needed.  See prn.  I have reviewed the [place] web site prior to prescribing narcotic medicine for this patient.   Electronically Signed Sanjuana Kava, MD 6/8/20211:40 PM

## 2019-10-26 ENCOUNTER — Ambulatory Visit (INDEPENDENT_AMBULATORY_CARE_PROVIDER_SITE_OTHER): Payer: Medicare Other | Admitting: Psychology

## 2019-10-26 ENCOUNTER — Other Ambulatory Visit (HOSPITAL_COMMUNITY): Payer: Self-pay | Admitting: Internal Medicine

## 2019-10-26 ENCOUNTER — Other Ambulatory Visit: Payer: Self-pay | Admitting: *Deleted

## 2019-10-26 DIAGNOSIS — M25561 Pain in right knee: Secondary | ICD-10-CM

## 2019-10-26 MED ORDER — ESTRADIOL 2 MG PO TABS: 2.0000 mg | ORAL_TABLET | Freq: Every day | ORAL | 3 refills | Status: DC

## 2019-11-15 ENCOUNTER — Encounter: Payer: Medicare Other | Attending: Physician Assistant | Admitting: Skilled Nursing Facility1

## 2019-11-15 ENCOUNTER — Other Ambulatory Visit: Payer: Self-pay

## 2019-11-15 DIAGNOSIS — E669 Obesity, unspecified: Secondary | ICD-10-CM

## 2019-11-15 DIAGNOSIS — E119 Type 2 diabetes mellitus without complications: Secondary | ICD-10-CM | POA: Diagnosis not present

## 2019-11-15 NOTE — Progress Notes (Signed)
Supervised Weight Loss Visit Bariatric Nutrition Education  Planned Surgery: LapBand to Sleeve   Pt Expectation of Surgery/ Goals: to be able to play with grandchildren, not feel fatigued constantly, rid of headaches, improve energy, knee replacements  6th out of 6 SWL Appointments    NUTRITION ASSESSMENT  Anthropometrics  Start weight at [facility]: 302 lbs (date: 07/03/2019) Today's weight: 301.5 lbs BMI: 48.6 kg/m2    Biochemical Labs A1c 8.1% (down from 8.9%)   Lifestyle & Dietary Hx Continues to do well with chewing foods thoroughly and not drinking with meals. Eats a lot of fruit, yogurt, and snacks. Other snacks may be Activia yogurt, peanut butter, almond butter with celery, popcorn, dehydrated okra, and protein shakes. Eats frequently throughout the day.   Pt states she checks her blood sugar every day averaging 179 and every night. Pt states she started a program that cost her 492 dollars fro one month.  Pt states she has a surgery date in August.   Pt states in the last 6 visits she believes she has learned to eat smaller portions and was already used to not drinking with meals. Pt states after surgery she will not struggle with anything after surgery.   Estimated daily fluid intake: 64-96 oz Current average weekly physical activity: gym ~4 days/week  24-Hr Dietary Recall First Meal: banana + greek yogurt + strawberries  Snack: 90 kcal greek yogurt bar  Second Meal: grilled chicken + orange "fluff" (ff cool whip + sugar free pudding + fruit)  Snack: protein shake   Third Meal: grilled meat (chicken/turkey/seafood) + green vegetable   Snack: 100 kcal nuts  Beverages: water, black coffee, hot green tea, unsweetened soy milk  Estimated Energy Needs Calories: 1500-1600 Carbohydrate: 170-180g Protein: 94-100g Fat: 50-53g   NUTRITION DIAGNOSIS  Overweight/obesity ([place]-3.3) related to past poor dietary habits and physical inactivity as evidenced by patient w/ planned  Sleeve Gastrectomy surgery following dietary guidelines for continued weight loss.   NUTRITION INTERVENTION  Nutrition counseling (C-1) and education (E-2) to facilitate bariatric surgery goals.  Pre-Op Goals Progress & New Goals . Tracking food and beverage intake via MyFitness Pal  . Physically active throughout the week (~4 days/week)  . Incorporating snacks as needed, using the Balanced Snacks sheet for ideas . Has increased water intake to 4-6 bottles of water per day . Chewing food to applesauce consistency before swallowing  . Does not drink before, during, and after meals   Learning Style & Readiness for Change Teaching method utilized: Visual & Auditory  Demonstrated degree of understanding via: Teach Back  Barriers to learning/adherence to lifestyle change: pt misinterprets Sleeves role in her health   MONITORING & EVALUATION Dietary intake, weekly physical activity, body weight, and pre-op goals   Next Steps  Patient is to return to [facility] for pre-op

## 2019-12-05 ENCOUNTER — Encounter: Payer: Self-pay | Admitting: Orthopaedic Surgery

## 2019-12-06 ENCOUNTER — Telehealth: Payer: Self-pay | Admitting: Orthopaedic Surgery

## 2019-12-06 ENCOUNTER — Ambulatory Visit: Payer: Medicare Other | Admitting: "Endocrinology

## 2019-12-06 ENCOUNTER — Other Ambulatory Visit: Payer: Self-pay | Admitting: "Endocrinology

## 2019-12-06 LAB — COMPREHENSIVE METABOLIC PANEL WITH GFR
Calcium: 9.5 (ref 8.7–10.7)
GFR calc Af Amer: 111
GFR calc non Af Amer: 96

## 2019-12-06 LAB — BASIC METABOLIC PANEL WITH GFR
BUN: 15 (ref 4–21)
CO2: 25 — AB (ref 13–22)
Chloride: 96 — AB (ref 99–108)
Creatinine: 0.7 (ref 0.5–1.1)
Potassium: 5 (ref 3.4–5.3)
Sodium: 136 — AB (ref 137–147)

## 2019-12-06 LAB — MICROALBUMIN, URINE: Microalb, Ur: 20

## 2019-12-06 LAB — TSH: TSH: 1.56 (ref 0.41–5.90)

## 2019-12-06 MED ORDER — OXYCODONE-ACETAMINOPHEN 5-325 MG PO TABS: 1.0000 | ORAL_TABLET | Freq: Four times a day (QID) | ORAL | 0 refills | Status: DC | PRN

## 2019-12-06 NOTE — Telephone Encounter (Signed)
Patient requests refill on Oxycodone/Acetaminophen 5-325  Mgs.  Qty  120      Sig: Take 1 tablet by mouth every 6 (six) hours as needed for moderate pain or severe pain (Must last 30 days.).    Patient states she uses [facility] on [address]

## 2019-12-07 LAB — COMPREHENSIVE METABOLIC PANEL
ALT: 42 IU/L — ABNORMAL HIGH (ref 0–32)
AST: 48 IU/L — ABNORMAL HIGH (ref 0–40)
Albumin/Globulin Ratio: 1.6 (ref 1.2–2.2)
Albumin: 4.5 g/dL (ref 3.8–4.9)
Alkaline Phosphatase: 132 IU/L — ABNORMAL HIGH (ref 48–121)
BUN/Creatinine Ratio: 23 (ref 12–28)
BUN: 15 mg/dL (ref 8–27)
Bilirubin Total: 0.4 mg/dL (ref 0.0–1.2)
CO2: 25 mmol/L (ref 20–29)
Calcium: 9.5 mg/dL (ref 8.7–10.3)
Chloride: 96 mmol/L (ref 96–106)
Creatinine, Ser: 0.66 mg/dL (ref 0.57–1.00)
GFR calc Af Amer: 111 mL/min/{1.73_m2} (ref >59)
GFR calc non Af Amer: 96 mL/min/{1.73_m2} (ref >59)
Globulin, Total: 2.9 g/dL (ref 1.5–4.5)
Glucose: 319 mg/dL — ABNORMAL HIGH (ref 65–99)
Potassium: 5 mmol/L (ref 3.5–5.2)
Sodium: 136 mmol/L (ref 134–144)
Total Protein: 7.4 g/dL (ref 6.0–8.5)

## 2019-12-07 LAB — LIPID PANEL
Cholesterol: 196 (ref 0–200)
HDL: 52 (ref 35–70)
LDL Cholesterol: 25
Triglycerides: 144 (ref 40–160)

## 2019-12-07 LAB — LIPID PANEL W/O CHOL/HDL RATIO
Cholesterol, Total: 196 mg/dL (ref 100–199)
HDL: 52 mg/dL (ref >39)
LDL Chol Calc (NIH): 119 mg/dL — ABNORMAL HIGH (ref 0–99)
Triglycerides: 144 mg/dL (ref 0–149)
VLDL Cholesterol Cal: 25 mg/dL (ref 5–40)

## 2019-12-07 LAB — T4, FREE: Free T4: 1.41 ng/dL (ref 0.82–1.77)

## 2019-12-07 LAB — MICROALBUMIN, URINE: Microalbumin, Urine: 20 ug/mL

## 2019-12-07 LAB — TSH: TSH: 1.56 u[IU]/mL (ref 0.450–4.500)

## 2019-12-07 LAB — SPECIMEN STATUS REPORT

## 2019-12-11 ENCOUNTER — Other Ambulatory Visit: Payer: Self-pay

## 2019-12-11 ENCOUNTER — Telehealth (INDEPENDENT_AMBULATORY_CARE_PROVIDER_SITE_OTHER): Payer: Medicare Other | Admitting: Psychiatry

## 2019-12-11 ENCOUNTER — Encounter (HOSPITAL_COMMUNITY): Payer: Self-pay | Admitting: Psychiatry

## 2019-12-11 DIAGNOSIS — F3162 Bipolar disorder, current episode mixed, moderate: Secondary | ICD-10-CM

## 2019-12-11 MED ORDER — TEMAZEPAM 30 MG PO CAPS: 30.0000 mg | ORAL_CAPSULE | Freq: Every evening | ORAL | 2 refills | Status: DC | PRN

## 2019-12-11 MED ORDER — ALPRAZOLAM 1 MG PO TABS: 1.0000 mg | ORAL_TABLET | Freq: Three times a day (TID) | ORAL | 1 refills | Status: DC | PRN

## 2019-12-11 MED ORDER — ESCITALOPRAM OXALATE 20 MG PO TABS: 20.0000 mg | ORAL_TABLET | Freq: Every day | ORAL | 2 refills | Status: DC

## 2019-12-11 MED ORDER — ARIPIPRAZOLE 10 MG PO TABS: 10.0000 mg | ORAL_TABLET | Freq: Every day | ORAL | 2 refills | Status: DC
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
## 2019-12-12 ENCOUNTER — Other Ambulatory Visit: Payer: Self-pay

## 2019-12-12 ENCOUNTER — Ambulatory Visit (INDEPENDENT_AMBULATORY_CARE_PROVIDER_SITE_OTHER): Payer: Medicare Other | Admitting: "Endocrinology

## 2019-12-12 ENCOUNTER — Encounter: Payer: Self-pay | Admitting: "Endocrinology

## 2019-12-12 VITALS — BP 128/84 | HR 64 | Ht 66.0 in | Wt 303.4 lb

## 2019-12-12 DIAGNOSIS — E1165 Type 2 diabetes mellitus with hyperglycemia: Secondary | ICD-10-CM

## 2019-12-12 DIAGNOSIS — E782 Mixed hyperlipidemia: Secondary | ICD-10-CM | POA: Diagnosis not present

## 2019-12-12 DIAGNOSIS — I10 Essential (primary) hypertension: Secondary | ICD-10-CM | POA: Diagnosis not present

## 2019-12-12 LAB — POCT GLYCOSYLATED HEMOGLOBIN (HGB A1C): Hemoglobin A1C: 9.9 % — AB (ref 4.0–5.6)

## 2019-12-12 MED ORDER — LANTUS SOLOSTAR 100 UNIT/ML ~~LOC~~ SOPN: 60.0000 [IU] | PEN_INJECTOR | Freq: Every day | SUBCUTANEOUS | 1 refills | Status: DC

## 2019-12-12 NOTE — Progress Notes (Signed)
12/12/2019, 12:31 PM                                     Endocrinology follow-up note    Subjective:    Patient ID: [name], female    DOB: Feb 17, 1961.  [name] is being seen in follow up after she was seen in consultation for management of currently uncontrolled symptomatic diabetes requested by  Sharilyn Sites, MD.   Past Medical History:  Diagnosis Date  . Adenomatous polyp 2009  . Anemia 2003 after surgery needed 1 unit blood  . Anxiety   . Arthritis   . Bipolar 1 disorder ([place])   . Constipation   . Depression   . Diabetes mellitus   . Diabetes mellitus, type II ([place])   . Diverticula of colon 2009  . Generalized headaches   . GERD (gastroesophageal reflux disease)   . History of hiatal hernia    small  . Hypertension   . Migraine   . Obesity   . Pelvic floor dysfunction    abnormal anorectal manometry at [facility] in 2009  . Plantar fasciitis both feet  . Psoriasis   . Sleep apnea    cpap setting of 3.5    Past Surgical History:  Procedure Laterality Date  . ABDOMINAL HYSTERECTOMY     compete  . CATARACT EXTRACTION W/PHACO Right 02/15/2017   Procedure: CATARACT EXTRACTION PHACO AND INTRAOCULAR LENS PLACEMENT (IOC);  Surgeon: Rutherford Guys, MD;  Location: [facility];  Service: Ophthalmology;  Laterality: Right;  CDE: 4.18  . CATARACT EXTRACTION W/PHACO Left 03/01/2017   Procedure: CATARACT EXTRACTION PHACO AND INTRAOCULAR LENS PLACEMENT (IOC);  Surgeon: Rutherford Guys, MD;  Location: [facility];  Service: Ophthalmology;  Laterality: Left;  CDE: 2.56  . COLON RESECTION    03/30/2002   with end-colostomy and Hartmann's pouch  . COLON SURGERY     complicated diverticulitis requiring sigmoid resection with colostomy and subsequent takedown  . COLONOSCOPY  12/11/2007   Dr. Gala Romney- marginal prep, normal rectum pancolonic diverticula, adenomatous polyp  . COLONOSCOPY  08/28/2003      Wide open colonic anastomosis/Scattered diverticula noted  throughout colon/ Small external hemorrhoids  . COLONOSCOPY  04/2012   [facility]: hyperplastic polyps, diverticulosis, ileocolonic anastomosis.  . COLONOSCOPY WITH PROPOFOL N/A 06/07/2016   Procedure: COLONOSCOPY WITH PROPOFOL;  Surgeon: Daneil Dolin, MD;  Location: [facility];  Service: Endoscopy;  Laterality: N/A;  7:30 am  . COLOSTOMY CLOSURE  07/10/2002  . ESOPHAGOGASTRODUODENOSCOPY (EGD) WITH PROPOFOL N/A 08/23/2016   Procedure: ESOPHAGOGASTRODUODENOSCOPY (EGD) WITH PROPOFOL;  Surgeon: Alphonsa Overall, MD;  Location: [facility];  Service: General;  Laterality: N/A;  . FLEXIBLE SIGMOIDOSCOPY  01/20/2012   [facility]: incomplete/attempted colonoscopy. Inadequate prep precluded examination  . HEEL SPUR SURGERY Left 09/11/2013   both have been done  . KNEE ARTHROSCOPY  02/04/2004    left knee/partial medial meniscectomy.  Marland Kitchen KNEE SURGERY     3 arthroscopic  2 on left 1 on right  . LAPAROSCOPIC GASTRIC BANDING  2009  . LAPAROSCOPIC SALPINGOOPHERECTOMY  03/30/2002  . TUBAL LIGATION      Social History   Socioeconomic History  . Marital status: Married    Spouse name: Not on file  . Number of children: 2  . Years of education: college  . Highest education level: Not on file  Occupational History  . Occupation:  student at [employer]: UNEMPLOYED    Employer: DISABLED  Tobacco Use  . Smoking status: Never Smoker  . Smokeless tobacco: Never Used  Vaping Use  . Vaping Use: Never used  Substance and Sexual Activity  . Alcohol use: Not Currently    Comment: occasionally wine  . Drug use: No  . Sexual activity: Not Currently    Birth control/protection: Surgical  Other Topics Concern  . Not on file  Social History Narrative  . Not on file   Social Determinants of Health   Financial Resource Strain:   . Difficulty of Paying Living Expenses:   Food Insecurity:   . Worried About Charity fundraiser in the Last Year:   . Arboriculturist in the Last Year:   Transportation Needs:    . Film/video editor (Medical):   Marland Kitchen Lack of Transportation (Non-Medical):   Physical Activity:   . Days of Exercise per Week:   . Minutes of Exercise per Session:   Stress:   . Feeling of Stress :   Social Connections:   . Frequency of Communication with Friends and Family:   . Frequency of Social Gatherings with Friends and Family:   . Attends Religious Services:   . Active Member of Clubs or Organizations:   . Attends Archivist Meetings:   Marland Kitchen Marital Status:     Family History  Problem Relation Age of Onset  . Ovarian cancer Mother   . Heart disease Mother   . Anxiety disorder Mother   . Cirrhosis Father        deceased age 68  . Alcohol abuse Father   . Liver cancer Cousin        age 19, deceased  . Drug abuse Cousin   . ADD / ADHD Son   . Anxiety disorder Sister   . Dementia Maternal Grandfather   . Colon cancer Other        aunt, deceased age 61  . Breast cancer Other        aunt, deceased age 62  . Bipolar disorder Neg Hx   . Depression Neg Hx   . OCD Neg Hx   . Paranoid behavior Neg Hx   . Schizophrenia Neg Hx   . Seizures Neg Hx   . Sexual abuse Neg Hx   . Physical abuse Neg Hx     Outpatient Encounter Medications as of 12/12/2019  Medication Sig  . ALPRAZolam (XANAX) 1 MG tablet Take 1 tablet (1 mg total) by mouth 3 (three) times daily as needed for anxiety.  . ARIPiprazole (ABILIFY) 10 MG tablet Take 1 tablet (10 mg total) by mouth at bedtime.  . cycloSPORINE (RESTASIS) 0.05 % ophthalmic emulsion Place 2 drops into both eyes 2 (two) times daily.   . diclofenac (VOLTAREN) 75 MG EC tablet Take 1 tablet (75 mg total) by mouth 2 (two) times daily.  Marland Kitchen escitalopram (LEXAPRO) 20 MG tablet Take 1 tablet (20 mg total) by mouth daily.  Marland Kitchen estradiol (ESTRACE) 2 MG tablet Take 1 tablet (2 mg total) by mouth daily.  Marland Kitchen exenatide (BYETTA) 5 MCG/0.02ML SOPN injection Inject 5 mcg into the skin 2 (two) times daily with a meal.  . fluticasone (FLONASE) 50  MCG/ACT nasal spray Place 2 sprays into the nose daily as needed for allergies.   Marland Kitchen gabapentin (NEURONTIN) 100 MG capsule TK ONE C PO QHS  . insulin glargine (LANTUS SOLOSTAR) 100 UNIT/ML Solostar Pen Inject  60 Units into the skin at bedtime.  . Insulin Pen Needle (B-D ULTRAFINE III SHORT PEN) 31G X 8 MM MISC 1 each by Does not apply route as directed.  . linaclotide (LINZESS) 290 MCG CAPS capsule Take 1 capsule (290 mcg total) by mouth daily. (Patient taking differently: Take 290 mcg by mouth daily as needed (constipation). )  . lisinopril (ZESTRIL) 10 MG tablet Take 1 tablet (10 mg total) by mouth daily.  Marland Kitchen loratadine (CLARITIN) 10 MG tablet Take 10 mg by mouth daily.  . meclizine (ANTIVERT) 25 MG tablet Take 25 mg by mouth daily as needed for dizziness.   . meloxicam (MOBIC) 15 MG tablet Take 1 tablet (15 mg total) by mouth daily.  . metFORMIN (GLUCOPHAGE) 1000 MG tablet Take 1,000 mg by mouth 2 (two) times daily.  . methocarbamol (ROBAXIN) 500 MG tablet Take 500 mg by mouth 3 (three) times daily as needed for muscle spasms.   . Multiple Vitamin (MULTIVITAMIN PO) Take 1 tablet by mouth daily.   . ondansetron (ZOFRAN ODT) 4 MG disintegrating tablet Take 1 tablet (4 mg total) by mouth every 8 (eight) hours as needed for nausea or vomiting.  Marland Kitchen oxyCODONE-acetaminophen (PERCOCET/ROXICET) 5-325 MG tablet Take 1 tablet by mouth every 6 (six) hours as needed for moderate pain or severe pain (Must last 30 days.).  Marland Kitchen pantoprazole (PROTONIX) 40 MG tablet Take 1 tablet (40 mg total) by mouth 2 (two) times daily.  . polyethylene glycol (MIRALAX / GLYCOLAX) packet Take 17 g by mouth daily.   Marland Kitchen Propylene Glycol-Glycerin (SOOTHE) 0.6-0.6 % SOLN Place 1 drop into both eyes 2 (two) times daily.  . Psyllium (METAMUCIL PO) Take 1 each by mouth 2 (two) times daily.   . rizatriptan (MAXALT-MLT) 10 MG disintegrating tablet Take 10 mg by mouth as needed for migraine. May repeat in 2 hours if needed  . temazepam  (RESTORIL) 30 MG capsule Take 1 capsule (30 mg total) by mouth at bedtime as needed for sleep.  Marland Kitchen terconazole (TERAZOL 7) 0.4 % vaginal cream Place 1 applicator vaginally at bedtime.  . [DISCONTINUED] insulin glargine (LANTUS SOLOSTAR) 100 UNIT/ML Solostar Pen Inject 40 Units into the skin at bedtime.   No facility-administered encounter medications on file as of 12/12/2019.    ALLERGIES: Allergies  Allergen Reactions  . Bactrim Itching, Nausea And Vomiting and Other (See Comments)    Redness    VACCINATION STATUS: Immunization History  Administered Date(s) Administered  . Moderna SARS-COVID-2 Vaccination 07/21/2019, 08/22/2019    Diabetes She presents for her follow-up diabetic visit. She has type 2 diabetes mellitus. Onset time: She was diagnosed at approximate age of 8 years. Her disease course has been worsening. There are no hypoglycemic associated symptoms. Pertinent negatives for hypoglycemia include no confusion, headaches, pallor or seizures. Associated symptoms include fatigue, polydipsia and polyuria. Pertinent negatives for diabetes include no chest pain and no polyphagia. There are no hypoglycemic complications. Symptoms are worsening. Diabetic complications include heart disease. Risk factors for coronary artery disease include dyslipidemia, family history, diabetes mellitus, hypertension, obesity, sedentary lifestyle and post-menopausal. Current diabetic treatments: She is currently on Byetta 5 mg twice daily, Metformin 1000 mg p.o. twice daily. Her weight is fluctuating minimally (She has previous history of lap band, recently increasing her weight to presurgical levels of 300 pounds.  She is currently being considered for sleeve gastrectomy.). She is following a generally unhealthy diet. When asked about meal planning, she reported none. She has not had a previous visit  with a dietitian. She never participates in exercise. Her home blood glucose trend is fluctuating minimally.  Her breakfast blood glucose range is generally 180-200 mg/dl. Her bedtime blood glucose range is generally 180-200 mg/dl. Her overall blood glucose range is 180-200 mg/dl. (She presents with significantly above target glycemic profile and point-of-care A1c is higher at 9.9% today.  She did not document any hypoglycemia.     ) An ACE inhibitor/angiotensin II receptor blocker is being taken. Eye exam is current.  Hypertension This is a chronic problem. The current episode started more than 1 year ago. The problem is uncontrolled. Pertinent negatives include no chest pain, headaches, palpitations or shortness of breath. Risk factors for coronary artery disease include diabetes mellitus, obesity, sedentary lifestyle and post-menopausal state. Past treatments include ACE inhibitors.    Review of systems: Limited as above. Objective:    BP 128/84   Pulse 64   Ht _0  (1.676 m)   Wt (!) 303 lb 6.4 oz (137.6 kg)   BMI 48.97 kg/m   Wt Readings from Last 3 Encounters:  12/12/19 (!) 303 lb 6.4 oz (137.6 kg)  11/15/19 (!) 301 lb (136.5 kg)  10/15/19 (!) 301 lb 8 oz (136.8 kg)     Physical Exam- Limited  Constitutional:  Body mass index is 48.97 kg/m. , not in acute distress, normal state of mind Eyes:  EOMI, no exophthalmos Neck: Supple Thyroid: No gross goiter Respiratory: Adequate breathing efforts Musculoskeletal: no gross deformities, strength intact in all four extremities, no gross restriction of joint movements Skin:  no rashes, no hyperemia Neurological: no tremor with outstretched hands,    CMP     Component Value Date/Time   NA 136 12/06/2019 0808   K 5.0 12/06/2019 0808   CL 96 12/06/2019 0808   CO2 25 12/06/2019 0808   GLUCOSE 319 (H) 12/06/2019 0808   GLUCOSE 147 (H) 04/02/2019 1152   BUN 15 12/06/2019 0808   CREATININE 0.66 12/06/2019 0808   CREATININE 0.74 03/23/2011 0400   CALCIUM 9.5 12/06/2019 0808   PROT 7.4 12/06/2019 0808   ALBUMIN 4.5 12/06/2019 0808    AST 48 (H) 12/06/2019 0808   ALT 42 (H) 12/06/2019 0808   ALKPHOS 132 (H) 12/06/2019 0808   BILITOT 0.4 12/06/2019 0808   GFRNONAA 96 12/06/2019 0808   GFRAA 111 12/06/2019 0808   Recent Results (from the past 2160 hour(s))  Microalbumin, urine     Status: None   Collection Time: 12/06/19 12:00 AM  Result Value Ref Range   Microalb, Ur 72.0   Basic metabolic panel     Status: Abnormal   Collection Time: 12/06/19 12:00 AM  Result Value Ref Range   BUN 15 4 - 21   CO2 25 (A) 13 - 22   Creatinine 0.7 0.5 - 1.1   Potassium 5.0 3.4 - 5.3   Sodium 136 (A) 137 - 147   Chloride 96 (A) 99 - 108  Comprehensive metabolic panel     Status: None   Collection Time: 12/06/19 12:00 AM  Result Value Ref Range   GFR calc Af Amer 111    GFR calc non Af Amer 96    Calcium 9.5 8.7 - 10.7  Lipid panel     Status: None   Collection Time: 12/06/19 12:00 AM  Result Value Ref Range   Triglycerides 144 40 - 160   Cholesterol 196 0 - 200   HDL 52 35 - 70   LDL Cholesterol 25  TSH     Status: None   Collection Time: 12/06/19 12:00 AM  Result Value Ref Range   TSH 1.56 0.41 - 5.90    Comment: free t4- 1.41  Comprehensive metabolic panel     Status: Abnormal   Collection Time: 12/06/19  8:08 AM  Result Value Ref Range   Glucose 319 (H) 65 - 99 mg/dL   BUN 15 8 - 27 mg/dL   Creatinine, Ser 0.66 0.57 - 1.00 mg/dL   GFR calc non Af Amer 96 >59 mL/min/1.73   GFR calc Af Amer 111 >59 mL/min/1.73    Comment: **[facility] currently reports eGFR in compliance with the current**   recommendations of the [employer]. [facility] will   update reporting as new guidelines are published from the NKF-ASN   Task force.    BUN/Creatinine Ratio 23 12 - 28   Sodium 136 134 - 144 mmol/L   Potassium 5.0 3.5 - 5.2 mmol/L   Chloride 96 96 - 106 mmol/L   CO2 25 20 - 29 mmol/L   Calcium 9.5 8.7 - 10.3 mg/dL   Total Protein 7.4 6.0 - 8.5 g/dL   Albumin 4.5 3.8 - 4.9 g/dL   Globulin, Total 2.9 1.5 - 4.5  g/dL   Albumin/Globulin Ratio 1.6 1.2 - 2.2   Bilirubin Total 0.4 0.0 - 1.2 mg/dL   Alkaline Phosphatase 132 (H) 48 - 121 IU/L   AST 48 (H) 0 - 40 IU/L   ALT 42 (H) 0 - 32 IU/L  Lipid Panel w/o Chol/HDL Ratio     Status: Abnormal   Collection Time: 12/06/19  8:08 AM  Result Value Ref Range   Cholesterol, Total 196 100 - 199 mg/dL   Triglycerides 144 0 - 149 mg/dL   HDL 52 >39 mg/dL   VLDL Cholesterol Cal 25 5 - 40 mg/dL   LDL Chol Calc (NIH) 119 (H) 0 - 99 mg/dL  T4, free     Status: None   Collection Time: 12/06/19  8:08 AM  Result Value Ref Range   Free T4 1.41 0.82 - 1.77 ng/dL  TSH     Status: None   Collection Time: 12/06/19  8:08 AM  Result Value Ref Range   TSH 1.560 0.450 - 4.500 uIU/mL  Microalbumin, urine     Status: None   Collection Time: 12/06/19  8:08 AM  Result Value Ref Range   Microalbumin, Urine 20.0 Not Estab. ug/mL  Specimen status report     Status: None   Collection Time: 12/06/19  8:08 AM  Result Value Ref Range   specimen status report Comment     Comment: Isac Caddy CMP14 Default Ambig Abbrev CMP14 Default A hand-written panel/profile was received from your office. In accordance with the [facility] Ambiguous Test Code Policy dated July 6962, we have completed your order by using the closest currently or formerly recognized AMA panel.  We have assigned Comprehensive Metabolic Panel (14), Test Code #322000 to this request.  If this is not the testing you wished to receive on this specimen, please contact the [facility] Client Inquiry/Technical Services Department to clarify the test order.  We appreciate your business. Ambig Abbrev LP Default Ambig Abbrev LP Default A hand-written panel/profile was received from your office. In accordance with the [facility] Ambiguous Test Code Policy dated July 9528, we have completed your order by using the closest currently or formerly recognized AMA panel.  We have assigned Lipid Panel, Test Code [id number] to this  request.  If this is not the testing you wished to receive on this specimen, plea se contact the [facility] Client Inquiry/Technical Services Department to clarify the test order.  We appreciate your business.   HgB A1c     Status: Abnormal   Collection Time: 12/12/19 10:32 AM  Result Value Ref Range   Hemoglobin A1C 9.9 (A) 4.0 - 5.6 %   HbA1c POC (<> result, manual entry)     HbA1c, POC (prediabetic range)     HbA1c, POC (controlled diabetic range)       Assessment & Plan:   1. Uncontrolled type 2 diabetes mellitus with hyperglycemia ([place])  - DARCEE DEKKER has currently uncontrolled symptomatic type 2 DM since  59 years of age.  She presents with significantly above target glycemic profile and point-of-care A1c is higher at 9.9% today.  She did not document any hypoglycemia.    - Recent labs reviewed. - I had a long discussion with her about the progressive nature of diabetes and the pathology behind its complications. -her diabetes is complicated by morbid obesity, sedentary life, hypertension and she remains at a high risk for more acute and chronic complications which include CAD, CVA, CKD, retinopathy, and neuropathy. These are all discussed in detail with her. -She is currently being prepared for sleeve gastrectomy at the end of this month.  - I have counseled her on diet  and weight management  by adopting a carbohydrate restricted/protein rich diet. Patient is encouraged to switch to  unprocessed or minimally processed     complex starch and increased protein intake (animal or plant source), fruits, and vegetables. -  she is advised to stick to a routine mealtimes to eat 3 meals  a day and avoid unnecessary snacks ( to snack only to correct hypoglycemia).    - she  admits there is a room for improvement in her diet and drink choices. -  Suggestion is made for her to avoid simple carbohydrates  from her diet including Cakes, Sweet Desserts / Pastries, Ice Cream, Soda (diet  and regular), Sweet Tea, Candies, Chips, Cookies, Sweet Pastries,  Store Bought Juices, Alcohol in Excess of  1-2 drinks a day, Artificial Sweeteners, Coffee Creamer, and "Sugar-free" Products. This will help patient to have stable blood glucose profile and potentially avoid unintended weight gain.  - she will be scheduled with Jearld Fenton, RDN, CDE for diabetes education.  - I have approached her with the following individualized plan to manage  her diabetes and patient agrees:   - she will continue to need basal insulin in order for her to achieve and maintain control of diabetes to target.   -She is becoming more comfortable taking her insulin injection.  Based on her presentation with above target glycemic profile and higher A1c of 9.9%, she will tolerate higher dose of basal insulin.    -She is advised to increase Lantus to 60 units nightly, start strict monitoring of glucose 4 times a day-before meals and at bedtime and return in 2 weeks-before her scheduled sleeve gastrectomy.    - she is warned not to take insulin without proper monitoring per orders.  - she is encouraged to call clinic for blood glucose levels less than 70 or above 200 mg /dl. - she is advised to continue Byetta 5 mcg subcutaneously twice daily, therapeutically suitable for patient . -She is advised to continue Metformin 1000 mg p.o. twice daily--daily after breakfast and supper.  - Specific targets for  A1c;  LDL,  HDL,  and Triglycerides were discussed with the patient.   2) Blood Pressure /Hypertension: -Her blood pressure is controlled to target.   she is advised to continue her current medications including lisinopril 5 mg p.o. daily with breakfast , lisinopril will be increased to  10 mg for next refill.  3) Lipids/Hyperlipidemia:     -Her recent lipid panel showed uncontrolled LDL of 91.  She will be considered for fasting lipid panel before her next visit.  4)  Weight/Diet: Her BMI is 48.97-   clearly  complicating her diabetes care.   she is  a candidate for weight loss. I discussed with her the fact that loss of 5 - 10% of her  current body weight will have the most impact on her diabetes management.  Exercise, and detailed carbohydrates information provided  -  detailed on discharge instructions. -She is status post previous lap band with successful loss of weight, recently regained back to her presurgical level.  She is undergoing evaluation for sleeve gastrectomy.  Is encouraged to pursue this treatment measure.  5) Chronic Care/Health Maintenance:  -she  is on ACEI/ARB and  is encouraged to initiate and continue to follow up with Ophthalmology, Dentist,  Podiatrist at least yearly or according to recommendations, and advised to  stay away from smoking. I have recommended yearly flu vaccine and pneumonia vaccine at least every 5 years; moderate intensity exercise for up to 150 minutes weekly; and  sleep for at least 7 hours a day.  - she is  advised to maintain close follow up with Sharilyn Sites, MD for primary care needs, as well as her other providers for optimal and coordinated care.  - Time spent on this patient care encounter:  35 min, of which > 50% was spent in  counseling and the rest reviewing her blood glucose logs , discussing her hypoglycemia and hyperglycemia episodes, reviewing her current and  previous labs / studies  ( including abstraction from other facilities) and medications  doses and developing a  long term treatment plan and documenting her care.   Please refer to Patient Instructions for Blood Glucose Monitoring and Insulin/Medications Dosing Guide"  in media tab for additional information. Please  also refer to " Patient Self Inventory" in the Media  tab for reviewed elements of pertinent patient history.  Francesca Jewett participated in the discussions, expressed understanding, and voiced agreement with the above plans.  All questions were answered to her satisfaction.  she is encouraged to contact clinic should she have any questions or concerns prior to her return visit.    Follow up plan: - Return in about 2 weeks (around 12/26/2019) for F/U with Meter and Logs Only - no Labs.  Glade Lloyd, MD [facility] [address] [place], [place] [address] Phone: [phone]  Fax: [phone]    12/12/2019, 12:31 PM  This note was partially dictated with voice recognition software. Similar sounding words can be transcribed inadequately or may not  be corrected upon review.

## 2019-12-12 NOTE — Patient Instructions (Signed)

## 2019-12-13 ENCOUNTER — Ambulatory Visit (HOSPITAL_COMMUNITY)
Admission: RE | Admit: 2019-12-13 | Discharge: 2019-12-13 | Disposition: A | Discharge status: Home / Self Care | Payer: Medicare Other | Source: Ambulatory Visit | Attending: Otolaryngology | Admitting: Otolaryngology

## 2019-12-13 DIAGNOSIS — J320 Chronic maxillary sinusitis: Secondary | ICD-10-CM | POA: Diagnosis present

## 2019-12-18 ENCOUNTER — Telehealth (HOSPITAL_COMMUNITY): Payer: Medicare Other | Admitting: Psychiatry

## 2019-12-21 NOTE — Patient Instructions (Addendum)
DUE TO COVID-19 ONLY ONE VISITOR IS ALLOWED TO COME WITH YOU AND STAY IN THE WAITING ROOM ONLY DURING PRE OP AND PROCEDURE DAY OF SURGERY. THE 1 VISITOR  MAY VISIT WITH YOU AFTER SURGERY IN YOUR PRIVATE ROOM DURING VISITING HOURS ONLY!  YOU NEED TO HAVE A COVID 19 TEST ON__8/27_____ @__12 :00p_____, THIS TEST MUST BE DONE BEFORE SURGERY,  COVID TESTING SITE [place] [id number], IT IS ON THE RIGHT GOING OUT [address] APPROXIMATELY  2 MINUTES PAST [employer] ON THE RIGHT. ONCE YOUR COVID TEST IS COMPLETED,  PLEASE BEGIN THE QUARANTINE INSTRUCTIONS AS OUTLINED IN YOUR HANDOUT.                ZEYA BALLES    Your procedure is scheduled on: 01/08/20   Report to [facility] Main  Entrance   Report to Short Stay at 5:30 AM     Call this number if you have problems the morning of surgery [phone]    Remember: Do not eat food or drink liquids :After Midnight.   BRUSH YOUR TEETH MORNING OF SURGERY AND RINSE YOUR MOUTH OUT, NO CHEWING GUM CANDY OR MINTS.     Take these medicines the morning of surgery with A SIP OF WATER: Lexapro, Flonase, Claritin, Protonix, use your eye drops Bring your mask and tubing to the hospital   DO NOT [place]   How to Manage Your Diabetes Before and After Surgery  Why is it important to control my blood sugar before and after surgery? . Improving blood sugar levels before and after surgery helps healing and can limit problems. . A way of improving blood sugar control is eating a healthy diet by: o  Eating less sugar and carbohydrates o  Increasing activity/exercise o  Talking with your doctor about reaching your blood sugar goals . High blood sugars (greater than 180 mg/dL) can raise your risk of infections and slow your recovery, so you will need to focus on controlling your diabetes during the weeks before surgery. . Make sure that the doctor who takes care of your  diabetes knows about your planned surgery including the date and location.  How do I manage my blood sugar before surgery? . Check your blood sugar at least 4 times a day, starting 2 days before surgery, to make sure that the level is not too high or low. o Check your blood sugar the morning of your surgery when you wake up and every 2 hours until you get to the Short Stay unit. . If your blood sugar is less than 70 mg/dL, you will need to treat for low blood sugar: o Do not take insulin. o Treat a low blood sugar (less than 70 mg/dL) with  cup of clear juice (cranberry or apple), 4 glucose tablets, OR glucose gel. o Recheck blood sugar in 15 minutes after treatment (to make sure it is greater than 70 mg/dL). If your blood sugar is not greater than 70 mg/dL on recheck, call [phone] for further instructions. . Report your blood sugar to the short stay nurse when you get to Short Stay.  . If you are admitted to the hospital after surgery: o Your blood sugar will be checked by the staff and you will probably be given insulin after surgery (instead of oral diabetes medicines) to make sure you have good blood sugar levels. o The goal for blood sugar control after surgery is  80-180 mg/dL.   WHAT DO I DO ABOUT MY DIABETES MEDICATION?  Marland Kitchen Do not take oral diabetes medicines (pills) the morning of surgery.  . THE NIGHT BEFORE SURGERY, take 40  units of  Lantus insulin.       . THE MORNING OF SURGERY, take 0   units of  insulin.  . The day of surgery, do not take other diabetes injectables, including Byetta (exenatide), Bydureon (exenatide ER), Victoza (liraglutide), or Trulicity (dulaglutide).        You may not have any metal on your body including hair pins and              piercings  Do not wear jewelry, make-up, lotions, powders or perfumes, deodorant             Do not wear nail polish on your fingernails.  Do not shave  48 hours prior to surgery.                 Do not bring  valuables to the hospital. [place].  Contacts, dentures or bridgework may not be worn into surgery.  Special Instructions: N/A              Please read over the following fact sheets you were given: _____________________________________________________________________             [facility] - Preparing for Surgery  Before surgery, you can play an important role.   Because skin is not sterile, your skin needs to be as free of germs as possible.   You can reduce the number of germs on your skin by washing with CHG (chlorahexidine gluconate) soap before surgery.   CHG is an antiseptic cleaner which kills germs and bonds with the skin to continue killing germs even after washing. Please DO NOT use if you have an allergy to CHG or antibacterial soaps.   If your skin becomes reddened/irritated stop using the CHG and inform your nurse when you arrive at Short Stay. Do not shave (including legs and underarms) for at least 48 hours prior to the first CHG shower.    Please follow these instructions carefully:  1.  Shower with CHG Soap the night before surgery and the  morning of Surgery.  2.  If you choose to wash your hair, wash your hair first as usual with your  normal  shampoo.  3.  After you shampoo, rinse your hair and body thoroughly to remove the  shampoo.                                        4.  Use CHG as you would any other liquid soap.  You can apply chg directly  to the skin and wash                       Gently with a scrungie or clean washcloth.  5.  Apply the CHG Soap to your body ONLY FROM THE NECK DOWN.   Do not use on face/ open                           Wound or open sores. Avoid contact with eyes, ears mouth and genitals (private parts).  Wash face,  Genitals (private parts) with your normal soap.             6.  Wash thoroughly, paying special attention to the area where your surgery  will be  performed.  7.  Thoroughly rinse your body with warm water from the neck down.  8.  DO NOT shower/wash with your normal soap after using and rinsing off  the CHG Soap.             9.  Pat yourself dry with a clean towel.            10.  Wear clean pajamas.            11.  Place clean sheets on your bed the night of your first shower and do not  sleep with pets. Day of Surgery : Do not apply any lotions/deodorants the morning of surgery.  Please wear clean clothes to the hospital/surgery center.  FAILURE TO FOLLOW THESE INSTRUCTIONS MAY RESULT IN THE CANCELLATION OF YOUR SURGERY PATIENT SIGNATURE_________________________________  NURSE SIGNATURE__________________________________  ________________________________________________________________________   Adam Phenix  An incentive spirometer is a tool that can help keep your lungs clear and active. This tool measures how well you are filling your lungs with each breath. Taking long deep breaths may help reverse or decrease the chance of developing breathing (pulmonary) problems (especially infection) following:  A long period of time when you are unable to move or be active. BEFORE THE PROCEDURE   If the spirometer includes an indicator to show your best effort, your nurse or respiratory therapist will set it to a desired goal.  If possible, sit up straight or lean slightly forward. Try not to slouch.  Hold the incentive spirometer in an upright position. INSTRUCTIONS FOR USE  1. Sit on the edge of your bed if possible, or sit up as far as you can in bed or on a chair. 2. Hold the incentive spirometer in an upright position. 3. Breathe out normally. 4. Place the mouthpiece in your mouth and seal your lips tightly around it. 5. Breathe in slowly and as deeply as possible, raising the piston or the ball toward the top of the column. 6. Hold your breath for 3-5 seconds or for as long as possible. Allow the piston or ball to fall  to the bottom of the column. 7. Remove the mouthpiece from your mouth and breathe out normally. 8. Rest for a few seconds and repeat Steps 1 through 7 at least 10 times every 1-2 hours when you are awake. Take your time and take a few normal breaths between deep breaths. 9. The spirometer may include an indicator to show your best effort. Use the indicator as a goal to work toward during each repetition. 10. After each set of 10 deep breaths, practice coughing to be sure your lungs are clear. If you have an incision (the cut made at the time of surgery), support your incision when coughing by placing a pillow or rolled up towels firmly against it. Once you are able to get out of bed, walk around indoors and cough well. You may stop using the incentive spirometer when instructed by your caregiver.  RISKS AND COMPLICATIONS  Take your time so you do not get dizzy or light-headed.  If you are in pain, you may need to take or ask for pain medication before doing incentive spirometry. It is harder to take a deep breath if you are having pain. AFTER USE  Rest and breathe slowly and easily.  It can be helpful to keep track of a log of your progress. Your caregiver can provide you with a simple table to help with this. If you are using the spirometer at home, follow these instructions: [place] IF:   You are having difficultly using the spirometer.  You have trouble using the spirometer as often as instructed.  Your pain medication is not giving enough relief while using the spirometer.  You develop fever of 100.5 F (38.1 C) or higher. SEEK IMMEDIATE MEDICAL CARE IF:   You cough up bloody sputum that had not been present before.  You develop fever of 102 F (38.9 C) or greater.  You develop worsening pain at or near the incision site. MAKE SURE YOU:   Understand these instructions.  Will watch your condition.  Will get help right away if you are not doing well or get  worse. Document Released: 09/06/2006 Document Revised: 07/19/2011 Document Reviewed: 11/07/2006 [facility] Patient Information 2014 [place], [place].   ________________________________________________________________________

## 2019-12-24 ENCOUNTER — Other Ambulatory Visit: Payer: Self-pay

## 2019-12-24 ENCOUNTER — Telehealth: Payer: Self-pay | Admitting: Orthopaedic Surgery

## 2019-12-24 ENCOUNTER — Encounter: Payer: Medicare Other | Attending: Physician Assistant | Admitting: Skilled Nursing Facility1

## 2019-12-24 DIAGNOSIS — E119 Type 2 diabetes mellitus without complications: Secondary | ICD-10-CM | POA: Diagnosis not present

## 2019-12-24 DIAGNOSIS — E669 Obesity, unspecified: Secondary | ICD-10-CM

## 2019-12-24 NOTE — Progress Notes (Signed)
Pre-Operative Nutrition Class:  Appt start time: 0459   End time:  1830.  Patient was seen on 12/24/2019 for Pre-Operative Bariatric Surgery Education at [facility]    Surgery date:  Surgery type:sleeve Start weight at [facility]: 302 Weight today: 305.4  The following the learning objectives were met by the patient during this course:  Identify Pre-Op Dietary Goals and will begin 2 weeks pre-operatively  Identify appropriate sources of fluids and proteins   State protein recommendations and appropriate sources pre and post-operatively  Identify Post-Operative Dietary Goals and will follow for 2 weeks post-operatively  Identify appropriate multivitamin and calcium sources  Describe the need for physical activity post-operatively and will follow MD recommendations  State when to call healthcare provider regarding medication questions or post-operative complications  Handouts given during class include:  Pre-Op Bariatric Surgery Diet Handout  Protein Shake Handout  Post-Op Bariatric Surgery Nutrition Handout  [facility] Program Information Flyer  Support Group Information Flyer  [facility] Bariatric Supplements Price List  Follow-Up Plan: Patient will follow-up at [facility] 2 weeks post operatively for diet advancement per MD.

## 2019-12-24 NOTE — Telephone Encounter (Signed)
ok 

## 2019-12-24 NOTE — Telephone Encounter (Signed)
Patient called to relay, per her surgeon for her upcoming bariatric procedure on 01/08/20, that she has been advised to decrease or change her pain medication. Said that she was told her stomach would be too small after surgery; therefore, would not tolerate the current medication.  Due to several other medical appointments this week, may patient have a virtual/telephone visit to discuss this matter?  [name] can have tomorrow, 12/25/19 if so.

## 2019-12-25 ENCOUNTER — Ambulatory Visit (INDEPENDENT_AMBULATORY_CARE_PROVIDER_SITE_OTHER): Payer: Medicare Other | Admitting: Orthopaedic Surgery

## 2019-12-25 ENCOUNTER — Encounter: Payer: Self-pay | Admitting: Orthopaedic Surgery

## 2019-12-25 DIAGNOSIS — M25561 Pain in right knee: Secondary | ICD-10-CM

## 2019-12-25 DIAGNOSIS — G8929 Other chronic pain: Secondary | ICD-10-CM

## 2019-12-25 NOTE — Telephone Encounter (Signed)
Done

## 2019-12-25 NOTE — Progress Notes (Signed)
I tried to call her.  Her number in the chart is not her number.  Electronically Signed Sanjuana Kava, MD 8/17/20213:27 PM

## 2019-12-27 ENCOUNTER — Encounter: Payer: Self-pay | Admitting: "Endocrinology

## 2019-12-27 ENCOUNTER — Other Ambulatory Visit: Payer: Self-pay

## 2019-12-27 ENCOUNTER — Ambulatory Visit (INDEPENDENT_AMBULATORY_CARE_PROVIDER_SITE_OTHER): Payer: Medicare Other | Admitting: "Endocrinology

## 2019-12-27 VITALS — BP 142/78 | HR 72 | Ht 66.0 in | Wt 304.2 lb

## 2019-12-27 DIAGNOSIS — E1165 Type 2 diabetes mellitus with hyperglycemia: Secondary | ICD-10-CM | POA: Diagnosis not present

## 2019-12-27 DIAGNOSIS — E782 Mixed hyperlipidemia: Secondary | ICD-10-CM | POA: Diagnosis not present

## 2019-12-27 DIAGNOSIS — I10 Essential (primary) hypertension: Secondary | ICD-10-CM | POA: Diagnosis not present

## 2019-12-27 MED ORDER — FREESTYLE LIBRE 2 SENSOR MISC: 1.0000 | 3 refills | Status: DC

## 2019-12-27 MED ORDER — FREESTYLE LIBRE 2 READER DEVI
0 refills | Status: DC
Start: 2019-12-27 — End: 2023-02-01

## 2019-12-27 MED ORDER — ACCU-CHEK GUIDE VI STRP: ORAL_STRIP | 2 refills | Status: DC

## 2019-12-27 MED ORDER — ACCU-CHEK GUIDE W/DEVICE KIT
1.0000 | PACK | 0 refills | Status: DC
Start: 2019-12-27 — End: 2019-12-31

## 2019-12-27 MED ORDER — LANTUS SOLOSTAR 100 UNIT/ML ~~LOC~~ SOPN: 80.0000 [IU] | PEN_INJECTOR | Freq: Every day | SUBCUTANEOUS | 1 refills | Status: DC

## 2019-12-27 NOTE — Patient Instructions (Signed)

## 2019-12-27 NOTE — Progress Notes (Signed)
12/27/2019, 10:42 PM                                     Endocrinology follow-up note   Subjective:    Patient ID: [name], female    DOB: 1960/09/21.  [name] is being seen in follow up after she was seen in consultation for management of currently uncontrolled symptomatic diabetes requested by  Sharilyn Sites, MD.   Past Medical History:  Diagnosis Date  . Adenomatous polyp 2009  . Anemia 2003 after surgery needed 1 unit blood  . Anxiety   . Arthritis   . Bipolar 1 disorder ([place])   . Constipation   . Depression   . Diabetes mellitus   . Diabetes mellitus, type II ([place])   . Diverticula of colon 2009  . Generalized headaches   . GERD (gastroesophageal reflux disease)   . History of hiatal hernia    small  . Hypertension   . Migraine   . Obesity   . Pelvic floor dysfunction    abnormal anorectal manometry at [facility] in 2009  . Plantar fasciitis both feet  . Psoriasis   . Sleep apnea    cpap setting of 3.5    Past Surgical History:  Procedure Laterality Date  . ABDOMINAL HYSTERECTOMY     compete  . CATARACT EXTRACTION W/PHACO Right 02/15/2017   Procedure: CATARACT EXTRACTION PHACO AND INTRAOCULAR LENS PLACEMENT (IOC);  Surgeon: Rutherford Guys, MD;  Location: [facility];  Service: Ophthalmology;  Laterality: Right;  CDE: 4.18  . CATARACT EXTRACTION W/PHACO Left 03/01/2017   Procedure: CATARACT EXTRACTION PHACO AND INTRAOCULAR LENS PLACEMENT (IOC);  Surgeon: Rutherford Guys, MD;  Location: [facility];  Service: Ophthalmology;  Laterality: Left;  CDE: 2.56  . COLON RESECTION    03/30/2002   with end-colostomy and Hartmann's pouch  . COLON SURGERY     complicated diverticulitis requiring sigmoid resection with colostomy and subsequent takedown  . COLONOSCOPY  12/11/2007   Dr. Gala Romney- marginal prep, normal rectum pancolonic diverticula, adenomatous polyp  . COLONOSCOPY  08/28/2003      Wide open colonic anastomosis/Scattered diverticula noted  throughout colon/ Small external hemorrhoids  . COLONOSCOPY  04/2012   [facility]: hyperplastic polyps, diverticulosis, ileocolonic anastomosis.  . COLONOSCOPY WITH PROPOFOL N/A 06/07/2016   Procedure: COLONOSCOPY WITH PROPOFOL;  Surgeon: Daneil Dolin, MD;  Location: [facility];  Service: Endoscopy;  Laterality: N/A;  7:30 am  . COLOSTOMY CLOSURE  07/10/2002  . ESOPHAGOGASTRODUODENOSCOPY (EGD) WITH PROPOFOL N/A 08/23/2016   Procedure: ESOPHAGOGASTRODUODENOSCOPY (EGD) WITH PROPOFOL;  Surgeon: Alphonsa Overall, MD;  Location: [facility];  Service: General;  Laterality: N/A;  . FLEXIBLE SIGMOIDOSCOPY  01/20/2012   [facility]: incomplete/attempted colonoscopy. Inadequate prep precluded examination  . HEEL SPUR SURGERY Left 09/11/2013   both have been done  . KNEE ARTHROSCOPY  02/04/2004    left knee/partial medial meniscectomy.  Marland Kitchen KNEE SURGERY     3 arthroscopic  2 on left 1 on right  . LAPAROSCOPIC GASTRIC BANDING  2009  . LAPAROSCOPIC SALPINGOOPHERECTOMY  03/30/2002  . TUBAL LIGATION      Social History   Socioeconomic History  . Marital status: Married    Spouse name: Not on file  . Number of children: 2  . Years of education: college  . Highest education level: Not on file  Occupational History  . 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 Smoking status: Never Smoker  . Smokeless tobacco: Never Used  Vaping Use  . Vaping Use: Never used  Substance and Sexual Activity  . Alcohol use: Not Currently    Comment: occasionally wine  . Drug use: No  . Sexual activity: Not Currently    Birth control/protection: Surgical  Other Topics Concern  . Not on file  Social History Narrative  . Not on file   Social Determinants of Health   Financial Resource Strain:   . Difficulty of Paying Living Expenses: Not on file  Food Insecurity:   . Worried About Charity fundraiser in the Last Year: Not on file  . Ran Out of Food in the Last Year: Not  on file  Transportation Needs:   . Lack of Transportation (Medical): Not on file  . Lack of Transportation (Non-Medical): Not on file  Physical Activity:   . Days of Exercise per Week: Not on file  . Minutes of Exercise per Session: Not on file  Stress:   . Feeling of Stress : Not on file  Social Connections:   . Frequency of Communication with Friends and Family: Not on file  . Frequency of Social Gatherings with Friends and Family: Not on file  . Attends Religious Services: Not on file  . Active Member of Clubs or Organizations: Not on file  . Attends Archivist Meetings: Not on file  . Marital Status: Not on file    Family History  Problem Relation Age of Onset  . Ovarian cancer Mother   . Heart disease Mother   . Anxiety disorder Mother   . Cirrhosis Father        deceased age 75  . Alcohol abuse Father   . Liver cancer Cousin        age 37, deceased  . Drug abuse Cousin   . ADD / ADHD Son   . Anxiety disorder Sister   . Dementia Maternal Grandfather   . Colon cancer Other        aunt, deceased age 54  . Breast cancer Other        aunt, deceased age 19  . Bipolar disorder Neg Hx   . Depression Neg Hx   . OCD Neg Hx   . Paranoid behavior Neg Hx   . Schizophrenia Neg Hx   . Seizures Neg Hx   . Sexual abuse Neg Hx   . Physical abuse Neg Hx     Outpatient Encounter Medications as of 12/27/2019  Medication Sig  . ALPRAZolam (XANAX) 1 MG tablet Take 1 tablet (1 mg total) by mouth 3 (three) times daily as needed for anxiety.  . ARIPiprazole (ABILIFY) 10 MG tablet Take 1 tablet (10 mg total) by mouth at bedtime.  Marland Kitchen ascorbic acid (VITAMIN C) 500 MG tablet Take 500 mg by mouth daily.  . Blood Glucose Monitoring Suppl (ACCU-CHEK GUIDE) w/Device KIT 1 Piece by Does not apply route as directed.  . Continuous Blood Gluc Receiver (FREESTYLE LIBRE 2 READER) DEVI As directed  . Continuous Blood Gluc Sensor (FREESTYLE LIBRE 2 SENSOR) MISC 1 Piece by Does not apply  route every 14 (fourteen) days.  . cycloSPORINE (RESTASIS) 0.05 % ophthalmic emulsion Place 2 drops into both eyes 2 (two) times daily.   Marland Kitchen escitalopram (LEXAPRO) 20 MG tablet Take 1 tablet (20 mg total) by mouth daily.  Marland Kitchen exenatide (BYETTA)  5 MCG/0.02ML SOPN injection Inject 5 mcg into the skin 2 (two) times daily with a meal.  . fluticasone (FLONASE) 50 MCG/ACT nasal spray Place 2 sprays into the nose daily as needed for allergies.   Marland Kitchen gabapentin (NEURONTIN) 300 MG capsule Take 300 mg by mouth 2 (two) times daily.  Marland Kitchen glucose blood (ACCU-CHEK GUIDE) test strip Use as instructed  . insulin glargine (LANTUS SOLOSTAR) 100 UNIT/ML Solostar Pen Inject 80 Units into the skin at bedtime.  . Insulin Pen Needle (B-D ULTRAFINE III SHORT PEN) 31G X 8 MM MISC 1 each by Does not apply route as directed.  Marland Kitchen lisinopril (ZESTRIL) 5 MG tablet Take 5 mg by mouth daily.  Marland Kitchen loratadine (CLARITIN) 10 MG tablet Take 10 mg by mouth daily.  . meclizine (ANTIVERT) 25 MG tablet Take 25 mg by mouth 2 (two) times daily as needed for dizziness.   . Menthol, Topical Analgesic, (ICY HOT EX) Apply 1 application topically 4 (four) times daily as needed (joint pain.).  Marland Kitchen metFORMIN (GLUCOPHAGE) 1000 MG tablet Take 1,000 mg by mouth 2 (two) times daily.  . methocarbamol (ROBAXIN) 500 MG tablet Take 500 mg by mouth 3 (three) times daily as needed for muscle spasms.   . Multiple Vitamin (MULTIVITAMIN WITH MINERALS) TABS tablet Take 1 tablet by mouth daily.  . ondansetron (ZOFRAN ODT) 4 MG disintegrating tablet Take 1 tablet (4 mg total) by mouth every 8 (eight) hours as needed for nausea or vomiting.  . pantoprazole (PROTONIX) 40 MG tablet Take 1 tablet (40 mg total) by mouth 2 (two) times daily. (Patient taking differently: Take 40 mg by mouth daily before breakfast. )  . Probiotic CHEW Chew 1-2 capsules by mouth daily. *Hold for diarrhea* (Gummy)  . Propylene Glycol-Glycerin (SOOTHE) 0.6-0.6 % SOLN Place 1 drop into both eyes 2  (two) times daily.  . rizatriptan (MAXALT-MLT) 10 MG disintegrating tablet Take 10 mg by mouth as needed for migraine. May repeat in 2 hours if needed  . temazepam (RESTORIL) 30 MG capsule Take 1 capsule (30 mg total) by mouth at bedtime as needed for sleep.  . [DISCONTINUED] diclofenac (VOLTAREN) 75 MG EC tablet Take 1 tablet (75 mg total) by mouth 2 (two) times daily.  . [DISCONTINUED] estradiol (ESTRACE) 2 MG tablet Take 1 tablet (2 mg total) by mouth daily.  . [DISCONTINUED] insulin glargine (LANTUS SOLOSTAR) 100 UNIT/ML Solostar Pen Inject 60 Units into the skin at bedtime.  . [DISCONTINUED] oxyCODONE-acetaminophen (PERCOCET/ROXICET) 5-325 MG tablet Take 1 tablet by mouth every 6 (six) hours as needed for moderate pain or severe pain (Must last 30 days.).   No facility-administered encounter medications on file as of 12/27/2019.    ALLERGIES: Allergies  Allergen Reactions  . Bactrim Itching, Nausea And Vomiting and Other (See Comments)    Redness    VACCINATION STATUS: Immunization History  Administered Date(s) Administered  . Moderna SARS-COVID-2 Vaccination 07/21/2019, 08/22/2019    Diabetes She presents for her follow-up diabetic visit. She has type 2 diabetes mellitus. Onset time: She was diagnosed at approximate age of 47 years. Her disease course has been worsening. There are no hypoglycemic associated symptoms. Pertinent negatives for hypoglycemia include no confusion, headaches, pallor or seizures. Associated symptoms include fatigue, polydipsia and polyuria. Pertinent negatives for diabetes include no chest pain and no polyphagia. There are no hypoglycemic complications. Symptoms are worsening. Diabetic complications include heart disease. Risk factors for coronary artery disease include dyslipidemia, family history, diabetes mellitus, hypertension, obesity, sedentary lifestyle and post-menopausal.  Current diabetic treatments: She is currently on Byetta 5 mg twice daily,  Metformin 1000 mg p.o. twice daily. Her weight is fluctuating minimally (She has previous history of lap band, recently increasing her weight to presurgical levels of 300 pounds.  She is currently being considered for sleeve gastrectomy.). She is following a generally unhealthy diet. When asked about meal planning, she reported none. She has not had a previous visit with a dietitian. She never participates in exercise. Her home blood glucose trend is fluctuating minimally. Her breakfast blood glucose range is generally 180-200 mg/dl. Her bedtime blood glucose range is generally 180-200 mg/dl. Her overall blood glucose range is 180-200 mg/dl. (She presents with significantly above target glycemic profile and point-of-care A1c is higher at 9.9% today.  She did not document any hypoglycemia.     ) An ACE inhibitor/angiotensin II receptor blocker is being taken. Eye exam is current.  Hypertension This is a chronic problem. The current episode started more than 1 year ago. The problem is uncontrolled. Pertinent negatives include no chest pain, headaches, palpitations or shortness of breath. Risk factors for coronary artery disease include diabetes mellitus, obesity, sedentary lifestyle and post-menopausal state. Past treatments include ACE inhibitors.    Review of systems: Limited as above. Objective:    BP (!) 142/78   Pulse 72   Ht _0  (1.676 m)   Wt (!) 304 lb 3.2 oz (138 kg)   BMI 49.10 kg/m   Wt Readings from Last 3 Encounters:  12/27/19 (!) 304 lb 3.2 oz (138 kg)  12/24/19 (!) 305 lb 6.4 oz (138.5 kg)  12/12/19 (!) 303 lb 6.4 oz (137.6 kg)     Physical Exam- Limited  Constitutional:  Body mass index is 49.1 kg/m. , not in acute distress, normal state of mind Eyes:  EOMI, no exophthalmos Neck: Supple Thyroid: No gross goiter Respiratory: Adequate breathing efforts Musculoskeletal: no gross deformities, strength intact in all four extremities, no gross restriction of joint  movements Skin:  no rashes, no hyperemia Neurological: no tremor with outstretched hands,    CMP     Component Value Date/Time   NA 136 12/06/2019 0808   K 5.0 12/06/2019 0808   CL 96 12/06/2019 0808   CO2 25 12/06/2019 0808   GLUCOSE 319 (H) 12/06/2019 0808   GLUCOSE 147 (H) 04/02/2019 1152   BUN 15 12/06/2019 0808   CREATININE 0.66 12/06/2019 0808   CREATININE 0.74 03/23/2011 0400   CALCIUM 9.5 12/06/2019 0808   PROT 7.4 12/06/2019 0808   ALBUMIN 4.5 12/06/2019 0808   AST 48 (H) 12/06/2019 0808   ALT 42 (H) 12/06/2019 0808   ALKPHOS 132 (H) 12/06/2019 0808   BILITOT 0.4 12/06/2019 0808   GFRNONAA 96 12/06/2019 0808   GFRAA 111 12/06/2019 0808   Recent Results (from the past 2160 hour(s))  Microalbumin, urine     Status: None   Collection Time: 12/06/19 12:00 AM  Result Value Ref Range   Microalb, Ur 07.6   Basic metabolic panel     Status: Abnormal   Collection Time: 12/06/19 12:00 AM  Result Value Ref Range   BUN 15 4 - 21   CO2 25 (A) 13 - 22   Creatinine 0.7 0.5 - 1.1   Potassium 5.0 3.4 - 5.3   Sodium 136 (A) 137 - 147   Chloride 96 (A) 99 - 108  Comprehensive metabolic panel     Status: None   Collection Time: 12/06/19 12:00 AM  Result Value  Ref Range   GFR calc Af Amer 111    GFR calc non Af Amer 96    Calcium 9.5 8.7 - 10.7  Lipid panel     Status: None   Collection Time: 12/06/19 12:00 AM  Result Value Ref Range   Triglycerides 144 40 - 160   Cholesterol 196 0 - 200   HDL 52 35 - 70   LDL Cholesterol 25   TSH     Status: None   Collection Time: 12/06/19 12:00 AM  Result Value Ref Range   TSH 1.56 0.41 - 5.90    Comment: free t4- 1.41  Comprehensive metabolic panel     Status: Abnormal   Collection Time: 12/06/19  8:08 AM  Result Value Ref Range   Glucose 319 (H) 65 - 99 mg/dL   BUN 15 8 - 27 mg/dL   Creatinine, Ser 0.66 0.57 - 1.00 mg/dL   GFR calc non Af Amer 96 >59 mL/min/1.73   GFR calc Af Amer 111 >59 mL/min/1.73    Comment: **[facility]  currently reports eGFR in compliance with the current**   recommendations of the [employer]. [facility] will   update reporting as new guidelines are published from the NKF-ASN   Task force.    BUN/Creatinine Ratio 23 12 - 28   Sodium 136 134 - 144 mmol/L   Potassium 5.0 3.5 - 5.2 mmol/L   Chloride 96 96 - 106 mmol/L   CO2 25 20 - 29 mmol/L   Calcium 9.5 8.7 - 10.3 mg/dL   Total Protein 7.4 6.0 - 8.5 g/dL   Albumin 4.5 3.8 - 4.9 g/dL   Globulin, Total 2.9 1.5 - 4.5 g/dL   Albumin/Globulin Ratio 1.6 1.2 - 2.2   Bilirubin Total 0.4 0.0 - 1.2 mg/dL   Alkaline Phosphatase 132 (H) 48 - 121 IU/L   AST 48 (H) 0 - 40 IU/L   ALT 42 (H) 0 - 32 IU/L  Lipid Panel w/o Chol/HDL Ratio     Status: Abnormal   Collection Time: 12/06/19  8:08 AM  Result Value Ref Range   Cholesterol, Total 196 100 - 199 mg/dL   Triglycerides 144 0 - 149 mg/dL   HDL 52 >39 mg/dL   VLDL Cholesterol Cal 25 5 - 40 mg/dL   LDL Chol Calc (NIH) 119 (H) 0 - 99 mg/dL  T4, free     Status: None   Collection Time: 12/06/19  8:08 AM  Result Value Ref Range   Free T4 1.41 0.82 - 1.77 ng/dL  TSH     Status: None   Collection Time: 12/06/19  8:08 AM  Result Value Ref Range   TSH 1.560 0.450 - 4.500 uIU/mL  Microalbumin, urine     Status: None   Collection Time: 12/06/19  8:08 AM  Result Value Ref Range   Microalbumin, Urine 20.0 Not Estab. ug/mL  Specimen status report     Status: None   Collection Time: 12/06/19  8:08 AM  Result Value Ref Range   specimen status report Comment     Comment: Isac Caddy CMP14 Default Ambig Abbrev CMP14 Default A hand-written panel/profile was received from your office. In accordance with the [facility] Ambiguous Test Code Policy dated July 9373, we have completed your order by using the closest currently or formerly recognized AMA panel.  We have assigned Comprehensive Metabolic Panel (14), Test Code #322000 to this request.  If this is not the testing you wished to receive  on  this specimen, please contact the [facility] Client Inquiry/Technical Services Department to clarify the test order.  We appreciate your business. Ambig Abbrev LP Default Ambig Abbrev LP Default A hand-written panel/profile was received from your office. In accordance with the [facility] Ambiguous Test Code Policy dated July 8527, we have completed your order by using the closest currently or formerly recognized AMA panel.  We have assigned Lipid Panel, Test Code [phone] to this request. If this is not the testing you wished to receive on this specimen, plea se contact the [place] [facility] Client Inquiry/Technical Services Department to clarify the test order.  We appreciate your business.   HgB A1c     Status: Abnormal   Collection Time: 12/12/19 10:32 AM  Result Value Ref Range   Hemoglobin A1C 9.9 (A) 4.0 - 5.6 %   HbA1c POC (<> result, manual entry)     HbA1c, POC (prediabetic range)     HbA1c, POC (controlled diabetic range)       Assessment & Plan:   1. Uncontrolled type 2 diabetes mellitus with hyperglycemia ([place])  - [name] has currently uncontrolled symptomatic type 2 DM since  59 years of age.  She presents with significant delay above target glycemic profile.  Her recent A1c was 9.9%.  She is being prepared for lap band reversal and possible sleeve gastrectomy in 13 days.  She did not document hypoglycemia.    - Recent labs reviewed. - I had a long discussion with her about the progressive nature of diabetes and the pathology behind its complications. -her diabetes is complicated by morbid obesity, sedentary life, hypertension and she remains at a high risk for more acute and chronic complications which include CAD, CVA, CKD, retinopathy, and neuropathy. These are all discussed in detail with her. -She is currently being prepared for sleeve gastrectomy at the end of this month.  - I have counseled her on diet  and weight management  by adopting a carbohydrate  restricted/protein rich diet. Patient is encouraged to switch to  unprocessed or minimally processed     complex starch and increased protein intake (animal or plant source), fruits, and vegetables. -  she is advised to stick to a routine mealtimes to eat 3 meals  a day and avoid unnecessary snacks ( to snack only to correct hypoglycemia).   - she  admits there is a room for improvement in her diet and drink choices. -  Suggestion is made for her to avoid simple carbohydrates  from her diet including Cakes, Sweet Desserts / Pastries, Ice Cream, Soda (diet and regular), Sweet Tea, Candies, Chips, Cookies, Sweet Pastries,  Store Bought Juices, Alcohol in Excess of  1-2 drinks a day, Artificial Sweeteners, Coffee Creamer, and "Sugar-free" Products. This will help patient to have stable blood glucose profile and potentially avoid unintended weight gain.    - I have approached her with the following individualized plan to manage  her diabetes and patient agrees:   -In order to optimize her surgical outcome, she needs to have closer to target glycemic profile. -Accordingly, she is advised to increase her Lantus to 80 units nightly, associated with monitoring of blood glucose 4 times a day--daily before meals and at bedtime, and advised to report glycemic profile greater than 200 mg per DL x3, less than 70 mg per DL x3.    - she is warned not to take insulin without proper monitoring per orders.  - she is advised to continue Byetta 5 mcg subcutaneously twice  daily, therapeutically suitable for patient . -She is advised to continue Metformin 1000 mg p.o. twice daily--daily after breakfast and supper.  - Specific targets for  A1c;  LDL, HDL,  and Triglycerides were discussed with the patient.   2) Blood Pressure /Hypertension: Her blood pressure is not controlled to target.   she is advised to continue her current medications including lisinopril 5 mg p.o. daily with breakfast , lisinopril will be  increased to  10 mg for next refill.  3) Lipids/Hyperlipidemia:     -Her recent lipid panel showed uncontrolled LDL of 91.  She will be considered for fasting lipid panel before her next visit.  4)  Weight/Diet: Her BMI is 56.38-   clearly complicating her diabetes care.   she is  a candidate for weight loss. I discussed with her the fact that loss of 5 - 10% of her  current body weight will have the most impact on her diabetes management.  Exercise, and detailed carbohydrates information provided  -  detailed on discharge instructions. -She is status post previous lap band with successful loss of weight, recently regained back to her presurgical level.  She is undergoing evaluation for sleeve gastrectomy.  Is encouraged to pursue this treatment measure.  5) Chronic Care/Health Maintenance:  -she  is on ACEI/ARB and  is encouraged to initiate and continue to follow up with Ophthalmology, Dentist,  Podiatrist at least yearly or according to recommendations, and advised to  stay away from smoking. I have recommended yearly flu vaccine and pneumonia vaccine at least every 5 years; moderate intensity exercise for up to 150 minutes weekly; and  sleep for at least 7 hours a day.  - she is  advised to maintain close follow up with Sharilyn Sites, MD for primary care needs, as well as her other providers for optimal and coordinated care.  - Time spent on this patient care encounter:  35 min, of which > 50% was spent in  counseling and the rest reviewing her blood glucose logs , discussing her hypoglycemia and hyperglycemia episodes, reviewing her current and  previous labs / studies  ( including abstraction from other facilities) and medications  doses and developing a  long term treatment plan and documenting her care.   Please refer to Patient Instructions for Blood Glucose Monitoring and Insulin/Medications Dosing Guide"  in media tab for additional information. Please  also refer to " Patient Self  Inventory" in the Media  tab for reviewed elements of pertinent patient history.  Francesca Jewett participated in the discussions, expressed understanding, and voiced agreement with the above plans.  All questions were answered to her satisfaction. she is encouraged to contact clinic should she have any questions or concerns prior to her return visit.  Follow up plan: - Return in about 3 weeks (around 01/17/2020) for F/U with Meter and Logs Only - no Labs.  Glade Lloyd, MD [facility] [address] [place], [place] [address] Phone: [phone]  Fax: [phone]    12/27/2019, 10:42 PM  This note was partially dictated with voice recognition software. Similar sounding words can be transcribed inadequately or may not  be corrected upon review.

## 2019-12-28 ENCOUNTER — Telehealth: Payer: Self-pay | Admitting: "Endocrinology

## 2019-12-28 DIAGNOSIS — E1165 Type 2 diabetes mellitus with hyperglycemia: Secondary | ICD-10-CM

## 2019-12-28 MED ORDER — LANTUS SOLOSTAR 100 UNIT/ML ~~LOC~~ SOPN: 80.0000 [IU] | PEN_INJECTOR | Freq: Every day | SUBCUTANEOUS | 0 refills | Status: DC

## 2019-12-28 NOTE — Telephone Encounter (Signed)
Rx sent for a 90 day supply to [facility].

## 2019-12-28 NOTE — Telephone Encounter (Signed)
Patient left a VM that her lantus was sent to a local pharmacy and it was $600, it has to be [facility] for [facility] day supply

## 2019-12-31 ENCOUNTER — Other Ambulatory Visit: Payer: Self-pay | Admitting: Surgery

## 2019-12-31 ENCOUNTER — Other Ambulatory Visit: Payer: Self-pay | Admitting: "Endocrinology

## 2019-12-31 MED ORDER — FREESTYLE LITE DEVI: 0 refills | Status: AC

## 2019-12-31 MED ORDER — GLUCOSE BLOOD VI STRP: ORAL_STRIP | 2 refills | Status: AC

## 2020-01-01 ENCOUNTER — Other Ambulatory Visit: Payer: Self-pay

## 2020-01-01 ENCOUNTER — Encounter (HOSPITAL_COMMUNITY): Payer: Self-pay

## 2020-01-01 ENCOUNTER — Encounter (HOSPITAL_COMMUNITY)
Admission: RE | Admit: 2020-01-01 | Discharge: 2020-01-01 | Disposition: A | Discharge status: Home / Self Care | Payer: Medicare Other | Source: Ambulatory Visit | Attending: Surgery | Admitting: Surgery

## 2020-01-01 DIAGNOSIS — Z01812 Encounter for preprocedural laboratory examination: Secondary | ICD-10-CM | POA: Diagnosis not present

## 2020-01-01 HISTORY — DX: Polyneuropathy, unspecified: G62.9

## 2020-01-01 LAB — COMPREHENSIVE METABOLIC PANEL
ALT: 51 U/L — ABNORMAL HIGH (ref 0–44)
AST: 52 U/L — ABNORMAL HIGH (ref 15–41)
Albumin: 4.1 g/dL (ref 3.5–5.0)
Alkaline Phosphatase: 78 U/L (ref 38–126)
Anion gap: 9 (ref 5–15)
BUN: 19 mg/dL (ref 6–20)
CO2: 28 mmol/L (ref 22–32)
Calcium: 9.1 mg/dL (ref 8.9–10.3)
Chloride: 103 mmol/L (ref 98–111)
Creatinine, Ser: 0.67 mg/dL (ref 0.44–1.00)
GFR calc Af Amer: >60 mL/min (ref >60)
GFR calc non Af Amer: >60 mL/min (ref >60)
Glucose, Bld: 116 mg/dL — ABNORMAL HIGH (ref 70–99)
Potassium: 4.5 mmol/L (ref 3.5–5.1)
Sodium: 140 mmol/L (ref 135–145)
Total Bilirubin: 0.6 mg/dL (ref 0.3–1.2)
Total Protein: 7.7 g/dL (ref 6.5–8.1)

## 2020-01-01 LAB — CBC WITH DIFFERENTIAL/PLATELET
Abs Immature Granulocytes: 0.1 10*3/uL — ABNORMAL HIGH (ref 0.00–0.07)
Basophils Absolute: 0.1 10*3/uL (ref 0.0–0.1)
Basophils Relative: 1 %
Eosinophils Absolute: 0.3 10*3/uL (ref 0.0–0.5)
Eosinophils Relative: 3 %
HCT: 43.9 % (ref 36.0–46.0)
Hemoglobin: 14.1 g/dL (ref 12.0–15.0)
Immature Granulocytes: 1 %
Lymphocytes Relative: 24 %
Lymphs Abs: 2.6 10*3/uL (ref 0.7–4.0)
MCH: 28.9 pg (ref 26.0–34.0)
MCHC: 32.1 g/dL (ref 30.0–36.0)
MCV: 90 fL (ref 80.0–100.0)
Monocytes Absolute: 0.7 10*3/uL (ref 0.1–1.0)
Monocytes Relative: 7 %
Neutro Abs: 7 10*3/uL (ref 1.7–7.7)
Neutrophils Relative %: 64 %
Platelets: 240 10*3/uL (ref 150–400)
RBC: 4.88 MIL/uL (ref 3.87–5.11)
RDW: 14.3 % (ref 11.5–15.5)
WBC: 10.9 10*3/uL — ABNORMAL HIGH (ref 4.0–10.5)
nRBC: 0 % (ref 0.0–0.2)

## 2020-01-01 LAB — GLUCOSE, CAPILLARY: Glucose-Capillary: 122 mg/dL — ABNORMAL HIGH (ref 70–99)

## 2020-01-01 NOTE — Progress Notes (Signed)
COVID Vaccine Completed:Yes Date COVID Vaccine completed:08/22/19 COVID vaccine manufacturer: Moderna      PCP - Dr. Eddie Candle Cardiologist -   Chest x-ray - 04/01/20 EKG - 07/05/19 Stress Test - no ECHO - no Cardiac Cath - no  Sleep Study - Yes CPAP - Yes  Fasting Blood Sugar - 150-225 Checks Blood Sugar _QID____ times a day  Blood Thinner Instructions:NA Aspirin Instructions: Last Dose:  Anesthesia review:   Patient denies shortness of breath, fever, cough and chest pain at PAT appointment  Yes   Patient verbalized understanding of instructions that were given to them at the PAT appointment. Patient was also instructed that they will need to review over the PAT instructions again at home before surgery. Yes  Pt c/o SOB with most activities because of her weight and pain in knees She had her insulin adjusted and her blood sugars have been better.

## 2020-01-02 LAB — HEMOGLOBIN A1C
Hgb A1c MFr Bld: 9.7 % — ABNORMAL HIGH (ref 4.8–5.6)
Mean Plasma Glucose: 232 mg/dL

## 2020-01-04 ENCOUNTER — Other Ambulatory Visit (HOSPITAL_COMMUNITY)
Admission: RE | Admit: 2020-01-04 | Discharge: 2020-01-04 | Disposition: A | Discharge status: Home / Self Care | Payer: Medicare Other | Source: Ambulatory Visit | Attending: Surgery | Admitting: Surgery

## 2020-01-04 DIAGNOSIS — Z01812 Encounter for preprocedural laboratory examination: Secondary | ICD-10-CM | POA: Insufficient documentation

## 2020-01-04 DIAGNOSIS — Z20822 Contact with and (suspected) exposure to covid-19: Secondary | ICD-10-CM | POA: Insufficient documentation

## 2020-01-04 LAB — SARS CORONAVIRUS 2 (TAT 6-24 HRS): SARS Coronavirus 2: NEGATIVE

## 2020-01-06 NOTE — H&P (Signed)
[name]  Location: [facility] Patient #: [id number] DOB: 12-Nov-1960 Married / Language: English / Race: White Female  History of Present Illness   The patient is a 59 year old female who presents with a complaint of management of lap band.  The PCP is Dr. Sharilyn Sites.  She comes by herself.   She has completed her preoperative bariatric assessment. She is conversing in the conversion from her lap band to a sleeve gastrectomy. I again reviewed the operative plans, perioperative risk, and the postoperative course. She meets with a nutritionist next week for her preop diet. I encouraged her that any weight she lost before surgery would be to her benefit. She is getting a root canal next week with a temporary cap placed. I don't think that this will impact surgery.  UGI - 06/15/2019 - normal post lap band CT scan of abdomen - 10/01/2019 - Has lower abdominal hernia Psych - saw Buena Irish - 10/12/2019 Nutrition - saw Sandie Ano - 11/15/2019  Per the Tracy, the patient is a candidate for bariatric surgery. The patient attended our initial information session and reviewed the types of bariatric surgery.  The patient is interested in converting to the sleeve gastrectomy. I discussed with the patient the indications and risks of bariatric surgery. She understands that we try to do this as a one stage procedure, but can take two stages. The potential risks of surgery include, but are not limited to, bleeding, infection, leak from the bowel, DVT and PE, open surgery, long term nutrition consequences, and death. The patient understands the importance of compliance and long term follow-up with our group after surgery. She also understands that conversion surgery carries higher risks. She also understands that if I am concerned about injury to the stomach, I may only remove the lap band.  Plan: 1. Conversion to  sleeve gastrectomy, removal of lap band - 01/08/2020, 2. Nutrition class next week, 3. Any weight loss before surgery would be good  Past Medical History: 1. History of lap band placement - APL - 03/19/2008 Initial weight - 288, BMI - 45.32 Her lowest weight was 211 on 10/31/2009 She is interested in conversion to lap sleeve  2. History of reflux - this has been better since the fluid has been removed from the lap band. 3. Diabetes mellitus, She is now on insulin - her HgbA1C is high and is to see Dr. Dorris Fetch next week. I have suggested that she get an appt about 2 weeks post op. She sees Dr. Concha Se Nida  4. History of sleep apnea On CPAP 5. History of anxiety and depression Bipolar - sees Dr. Darlina Guys at [place] On Abilify, Lexapro, carbamzepine, Xanax 6. Bilateral knee pain - right > left Sees Dr. Celedonio Miyamoto in [place] On tramadol for this He has talked to her about bilateral knee replacement, but will not do it until her BMI is less than 40 7. Migraines - (not on Topamax any longer) She used to see Dr. Jannifer Franklin - but not currently - in fact, it seems that she is seeing no one for her headaches. She just had a head CT for vertigo (07/2017) - but it was negative. 8. History of colon resection for diverticulitis - required colostomy and take down. 2003 - Dr. Tamala Julian 9. Chronic disability - since around 2010. 10. She has neuropathy in her feet. (R>L) 11. Dr. Buford Dresser did a colonoscopy in Jan 2018 - she had some sigmoid diverticulosis.  She has had some constipation. But she has started taking probiotic gummies and she is doing much better with her BM 12. Ventral hernia 6 x 11 cm on abdominal CT scan 03/22/2016 - not really changed on CT of 10/01/2019 13. Chronic pain meds - she says  that she has chronic back pain, knee  pain, and plantar fasciitis she is taking oxycodone BID  Social History: Married. Has 2 sons - 37 and 81 yo. On chronic disability  [Her brother died of head and neck cancer 04/05/2017  Physical Exam  General: Morbidly obese WF who is alert. She is very slow moving. She is wearing a mask. She is using a cane. HEENT: Normal. Pupils equal.  Neck: Supple. No mass. No thyroid mass.  Lymph Nodes: No supraclavicular or cervical nodes.  Lungs: Clear to auscultation and symmetric breath sounds. Heart: RRR. No murmur or rub.  Abdomen: Soft. No tenderness. No hernia. Normal bowel sounds.  She has a port in the RUQ. She has a midline scar/Left sided scar (old ostomy site)/Pfannenstiel scar I have trouble feeling the hernia in the lower body because of her obesity. She is more apple than pear and she has a moderate pannus  Extremities: Okay strength and ROM in upper and lower extremities. She moves slowly because of her weight. She takes some time to get on the exam table. She is using a walker to walk.  Assessment & Plan  1.  MORBID OBESITY WITH BMI OF 45.0-49.9, ADULT (E66.01)  Plan:  1. Conversion to sleeve gastrectomy, removal of lap band - 01/08/2020  2. Nutrition class next week  3. Any weight loss before surgery would be good  2.  HISTORY OF LAPAROSCOPIC ADJUSTABLE GASTRIC BANDING (Z98.84)  Story: APL lap band - 03/19/2008 - Marlies Ligman  Initial weight - 288, BMI - 45.32 Her lowest weight was 211 on 10/31/2009 3. VENTRAL HERNIA WITHOUT OBSTRUCTION OR GANGRENE (K43.9)  Story: The CT scan on 03/22/2016 shows a 6 x 11 cm defect in her lower abdomen below the umbilicus. 4. OBESITY WITH SLEEP APNEA (G47.30)  On CPAP 5.  DIABETES MELLITUS TYPE 2 IN OBESE (E11.69) She is now on insulin - her HgbA1C is high and is to see Dr. Dorris Fetch next week. I have suggested that she get an appt about 2 weeks post op. She sees Dr.  Concha Se Nida 6.  HYPERTENSION, BENIGN (I10) 7. History of anxiety and depression Bipolar - sees Dr. Darlina Guys at [place] On Abilify, Lexapro, carbamzepine, Xanax 8. Bilateral knee pain - right > left Sees Dr. Celedonio Miyamoto in [place] On tramadol for this He has talked to her about bilateral knee replacement, but will not do it until her BMI is less than 40 9. Migraines - (not on Topamax any longer) She used to see Dr. Jannifer Franklin - but not currently - in fact, it seems that she is seeing no one for her headaches. 10. History of colon resection for diverticulitis - required colostomy and take down. 2003 - Dr. Tamala Julian 11. She has neuropathy in her feet. (R>L) 12. Ventral hernia 6 x 11 cm on abdominal CT scan 03/22/2016 - not really changed on CT of 10/01/2019 13. Chronic pain meds - she says  that she has chronic back pain, knee pain, and plantar fasciitis she is taking oxycodone BID   Alphonsa Overall, MD, [facility] Surgery Office phone:  [phone]

## 2020-01-07 MED ORDER — BUPIVACAINE LIPOSOME 1.3 % IJ SUSP
20.0000 mL | Freq: Once | INTRAMUSCULAR | Status: DC
  Filled 2020-01-07: qty 20

## 2020-01-07 NOTE — Anesthesia Preprocedure Evaluation (Addendum)
Anesthesia Evaluation  Patient identified by MRN, date of birth, ID band Patient awake    Reviewed: Allergy & Precautions, NPO status , Patient's Chart, lab work & pertinent test results  Airway Mallampati: III  TM Distance: >3 FB Neck ROM: Full    Dental no notable dental hx. (+) Teeth Intact, Dental Advisory Given,    Pulmonary sleep apnea and Continuous Positive Airway Pressure Ventilation ,    Pulmonary exam normal breath sounds clear to auscultation       Cardiovascular hypertension, Pt. on medications Normal cardiovascular exam Rhythm:Regular Rate:Normal     Neuro/Psych  Headaches, PSYCHIATRIC DISORDERS Anxiety Depression Bipolar Disorder  Neuromuscular disease (B/L LE peripheral neuropathy )    GI/Hepatic Neg liver ROS, hiatal hernia, GERD  Medicated and Controlled,  Endo/Other  diabetes, Poorly Controlled, Type 2, Insulin Dependent, Oral Hypoglycemic AgentsMorbid obesityBMI 48 Last a1c 9.7 FS 177  Renal/GU negative Renal ROS  negative genitourinary   Musculoskeletal  (+) Arthritis , Osteoarthritis,    Abdominal (+) + obese,   Peds  Hematology negative hematology ROS (+)   Anesthesia Other Findings   Reproductive/Obstetrics negative OB ROS                            Anesthesia Physical Anesthesia Plan  ASA: III  Anesthesia Plan: General   Post-op Pain Management:    Induction: Intravenous  PONV Risk Score and Plan: 4 or greater and Ondansetron, Dexamethasone, Midazolam and Treatment may vary due to age or medical condition  Airway Management Planned: Oral ETT  Additional Equipment: None  Intra-op Plan:   Post-operative Plan: Extubation in OR  Informed Consent: I have reviewed the patients History and Physical, chart, labs and discussed the procedure including the risks, benefits and alternatives for the proposed anesthesia with the patient or authorized representative  who has indicated his/her understanding and acceptance.     Dental advisory given  Plan Discussed with: CRNA  Anesthesia Plan Comments:        Anesthesia Quick Evaluation

## 2020-01-08 ENCOUNTER — Inpatient Hospital Stay (HOSPITAL_COMMUNITY): Payer: Medicare Other | Admitting: Anesthesiology

## 2020-01-08 ENCOUNTER — Encounter (HOSPITAL_COMMUNITY)
Admission: RE | Disposition: A | Discharge status: Home / Self Care | Payer: Self-pay | Source: Home / Self Care | Attending: Surgery

## 2020-01-08 ENCOUNTER — Other Ambulatory Visit: Payer: Self-pay

## 2020-01-08 ENCOUNTER — Inpatient Hospital Stay (HOSPITAL_COMMUNITY)
Admission: RE | Admit: 2020-01-08 | Discharge: 2020-01-09 | DRG: 327 | Disposition: A | Discharge status: Home / Self Care | Payer: Medicare Other | Attending: Surgery | Admitting: Surgery

## 2020-01-08 ENCOUNTER — Inpatient Hospital Stay (HOSPITAL_COMMUNITY): Payer: Medicare Other | Admitting: Physician Assistant

## 2020-01-08 ENCOUNTER — Encounter (HOSPITAL_COMMUNITY): Payer: Self-pay | Admitting: Surgery

## 2020-01-08 DIAGNOSIS — M549 Dorsalgia, unspecified: Secondary | ICD-10-CM | POA: Diagnosis present

## 2020-01-08 DIAGNOSIS — K5900 Constipation, unspecified: Secondary | ICD-10-CM | POA: Diagnosis present

## 2020-01-08 DIAGNOSIS — G8929 Other chronic pain: Secondary | ICD-10-CM | POA: Diagnosis present

## 2020-01-08 DIAGNOSIS — K660 Peritoneal adhesions (postprocedural) (postinfection): Secondary | ICD-10-CM | POA: Diagnosis present

## 2020-01-08 DIAGNOSIS — G4730 Sleep apnea, unspecified: Secondary | ICD-10-CM | POA: Diagnosis present

## 2020-01-08 DIAGNOSIS — F419 Anxiety disorder, unspecified: Secondary | ICD-10-CM | POA: Diagnosis present

## 2020-01-08 DIAGNOSIS — Z23 Encounter for immunization: Secondary | ICD-10-CM

## 2020-01-08 DIAGNOSIS — F319 Bipolar disorder, unspecified: Secondary | ICD-10-CM | POA: Diagnosis present

## 2020-01-08 DIAGNOSIS — K760 Fatty (change of) liver, not elsewhere classified: Secondary | ICD-10-CM | POA: Diagnosis present

## 2020-01-08 DIAGNOSIS — I10 Essential (primary) hypertension: Secondary | ICD-10-CM | POA: Diagnosis present

## 2020-01-08 DIAGNOSIS — K9509 Other complications of gastric band procedure: Principal | ICD-10-CM | POA: Diagnosis present

## 2020-01-08 DIAGNOSIS — K219 Gastro-esophageal reflux disease without esophagitis: Secondary | ICD-10-CM | POA: Diagnosis present

## 2020-01-08 DIAGNOSIS — Z6841 Body Mass Index (BMI) 40.0 and over, adult: Secondary | ICD-10-CM | POA: Diagnosis not present

## 2020-01-08 DIAGNOSIS — M25561 Pain in right knee: Secondary | ICD-10-CM | POA: Diagnosis present

## 2020-01-08 DIAGNOSIS — Z808 Family history of malignant neoplasm of other organs or systems: Secondary | ICD-10-CM | POA: Diagnosis not present

## 2020-01-08 DIAGNOSIS — E119 Type 2 diabetes mellitus without complications: Secondary | ICD-10-CM | POA: Diagnosis present

## 2020-01-08 DIAGNOSIS — Y831 Surgical operation with implant of artificial internal device as the cause of abnormal reaction of the patient, or of later complication, without mention of misadventure at the time of the procedure: Secondary | ICD-10-CM | POA: Diagnosis present

## 2020-01-08 DIAGNOSIS — E1142 Type 2 diabetes mellitus with diabetic polyneuropathy: Secondary | ICD-10-CM | POA: Diagnosis present

## 2020-01-08 DIAGNOSIS — K439 Ventral hernia without obstruction or gangrene: Secondary | ICD-10-CM | POA: Diagnosis present

## 2020-01-08 DIAGNOSIS — Z9049 Acquired absence of other specified parts of digestive tract: Secondary | ICD-10-CM

## 2020-01-08 DIAGNOSIS — M25562 Pain in left knee: Secondary | ICD-10-CM | POA: Diagnosis present

## 2020-01-08 DIAGNOSIS — M722 Plantar fascial fibromatosis: Secondary | ICD-10-CM | POA: Diagnosis present

## 2020-01-08 DIAGNOSIS — Z794 Long term (current) use of insulin: Secondary | ICD-10-CM

## 2020-01-08 DIAGNOSIS — Z882 Allergy status to sulfonamides status: Secondary | ICD-10-CM

## 2020-01-08 HISTORY — PX: ESOPHAGOGASTRODUODENOSCOPY: SHX5428

## 2020-01-08 HISTORY — PX: LAPAROSCOPIC GASTRIC SLEEVE RESECTION: SHX5895

## 2020-01-08 LAB — TYPE AND SCREEN
ABO/RH(D): O POS
Antibody Screen: NEGATIVE

## 2020-01-08 LAB — GLUCOSE, CAPILLARY
Glucose-Capillary: 177 mg/dL — ABNORMAL HIGH (ref 70–99)
Glucose-Capillary: 219 mg/dL — ABNORMAL HIGH (ref 70–99)
Glucose-Capillary: 260 mg/dL — ABNORMAL HIGH (ref 70–99)
Glucose-Capillary: 343 mg/dL — ABNORMAL HIGH (ref 70–99)
Glucose-Capillary: 234 mg/dL — ABNORMAL HIGH (ref 70–99)
Glucose-Capillary: 199 mg/dL — ABNORMAL HIGH (ref 70–99)

## 2020-01-08 LAB — HEMOGLOBIN AND HEMATOCRIT, BLOOD
HCT: 42.4 % (ref 36.0–46.0)
Hemoglobin: 13.9 g/dL (ref 12.0–15.0)

## 2020-01-08 LAB — ABO/RH: ABO/RH(D): O POS

## 2020-01-08 SURGERY — GASTRECTOMY, SLEEVE, LAPAROSCOPIC
Anesthesia: General

## 2020-01-08 MED ORDER — LIDOCAINE 2% (20 MG/ML) 5 ML SYRINGE
INTRAMUSCULAR | Status: DC | PRN
  Administered 2020-01-08: 60 mg via INTRAVENOUS

## 2020-01-08 MED ORDER — LIDOCAINE 2% (20 MG/ML) 5 ML SYRINGE
INTRAMUSCULAR | Status: AC
  Filled 2020-01-08: qty 10

## 2020-01-08 MED ORDER — HYDROMORPHONE HCL 2 MG/ML IJ SOLN
INTRAMUSCULAR | Status: AC
  Filled 2020-01-08: qty 1

## 2020-01-08 MED ORDER — SUGAMMADEX SODIUM 500 MG/5ML IV SOLN
INTRAVENOUS | Status: DC | PRN
  Administered 2020-01-08: 300 mg via INTRAVENOUS

## 2020-01-08 MED ORDER — OXYCODONE HCL 5 MG/5ML PO SOLN
5.0000 mg | Freq: Four times a day (QID) | ORAL | Status: DC | PRN
  Administered 2020-01-09 (×2): 5 mg via ORAL
  Filled 2020-01-08 (×2): qty 5

## 2020-01-08 MED ORDER — ACETAMINOPHEN 160 MG/5ML PO SOLN
1000.0000 mg | Freq: Three times a day (TID) | ORAL | Status: DC
  Administered 2020-01-08 – 2020-01-09 (×4): 1000 mg via ORAL
  Filled 2020-01-08 (×4): qty 40.6

## 2020-01-08 MED ORDER — PROMETHAZINE HCL 25 MG/ML IJ SOLN: 6.2500 mg | INTRAMUSCULAR | Status: DC | PRN

## 2020-01-08 MED ORDER — 0.9 % SODIUM CHLORIDE (POUR BTL) OPTIME
TOPICAL | Status: DC | PRN
  Administered 2020-01-08: 1000 mL

## 2020-01-08 MED ORDER — SUFENTANIL CITRATE 50 MCG/ML IV SOLN
INTRAVENOUS | Status: AC
  Filled 2020-01-08: qty 1

## 2020-01-08 MED ORDER — METHOCARBAMOL 500 MG PO TABS
500.0000 mg | ORAL_TABLET | Freq: Three times a day (TID) | ORAL | Status: DC | PRN
  Administered 2020-01-09 (×2): 500 mg via ORAL
  Filled 2020-01-08 (×2): qty 1

## 2020-01-08 MED ORDER — ARIPIPRAZOLE 10 MG PO TABS: 10.0000 mg | ORAL_TABLET | Freq: Every day | ORAL | Status: DC

## 2020-01-08 MED ORDER — ENOXAPARIN SODIUM 30 MG/0.3ML ~~LOC~~ SOLN
30.0000 mg | Freq: Two times a day (BID) | SUBCUTANEOUS | Status: DC
  Administered 2020-01-08 – 2020-01-09 (×2): 30 mg via SUBCUTANEOUS
  Filled 2020-01-08 (×2): qty 0.3

## 2020-01-08 MED ORDER — KETAMINE HCL 10 MG/ML IJ SOLN
INTRAMUSCULAR | Status: AC
  Filled 2020-01-08: qty 1

## 2020-01-08 MED ORDER — FLUTICASONE PROPIONATE 50 MCG/ACT NA SUSP: 2.0000 | Freq: Every day | NASAL | Status: DC | PRN

## 2020-01-08 MED ORDER — POTASSIUM CHLORIDE IN NACL 20-0.45 MEQ/L-% IV SOLN
INTRAVENOUS | Status: DC
  Filled 2020-01-08 (×4): qty 1000

## 2020-01-08 MED ORDER — SUFENTANIL CITRATE 50 MCG/ML IV SOLN
INTRAVENOUS | Status: DC | PRN
Start: 2020-01-08 — End: 2020-01-08
  Administered 2020-01-08 (×2): 5 ug via INTRAVENOUS
  Administered 2020-01-08 (×7): 10 ug via INTRAVENOUS

## 2020-01-08 MED ORDER — LACTATED RINGERS IR SOLN
Status: DC | PRN
  Administered 2020-01-08: 1000 mL

## 2020-01-08 MED ORDER — TISSEEL VH 10 ML EX KIT
PACK | CUTANEOUS | Status: AC
  Filled 2020-01-08: qty 2

## 2020-01-08 MED ORDER — ONDANSETRON HCL 4 MG/2ML IJ SOLN
INTRAMUSCULAR | Status: DC | PRN
  Administered 2020-01-08: 4 mg via INTRAVENOUS

## 2020-01-08 MED ORDER — MORPHINE SULFATE (PF) 2 MG/ML IV SOLN
1.0000 mg | INTRAVENOUS | Status: DC | PRN
  Administered 2020-01-08 (×2): 2 mg via INTRAVENOUS
  Filled 2020-01-08 (×2): qty 1

## 2020-01-08 MED ORDER — DEXAMETHASONE SODIUM PHOSPHATE 10 MG/ML IJ SOLN
INTRAMUSCULAR | Status: AC
  Filled 2020-01-08: qty 1

## 2020-01-08 MED ORDER — SUMATRIPTAN SUCCINATE 25 MG PO TABS
100.0000 mg | ORAL_TABLET | ORAL | Status: DC | PRN
  Filled 2020-01-08: qty 4

## 2020-01-08 MED ORDER — PROPOFOL 10 MG/ML IV BOLUS
INTRAVENOUS | Status: DC | PRN
  Administered 2020-01-08: 30 mg via INTRAVENOUS
  Administered 2020-01-08: 200 mg via INTRAVENOUS
  Administered 2020-01-08: 30 mg via INTRAVENOUS

## 2020-01-08 MED ORDER — ONDANSETRON HCL 4 MG/2ML IJ SOLN: 4.0000 mg | INTRAMUSCULAR | Status: DC | PRN

## 2020-01-08 MED ORDER — HYDROMORPHONE HCL 1 MG/ML IJ SOLN: 0.2500 mg | INTRAMUSCULAR | Status: DC | PRN

## 2020-01-08 MED ORDER — INSULIN ASPART 100 UNIT/ML ~~LOC~~ SOLN
0.0000 [IU] | SUBCUTANEOUS | Status: DC
  Administered 2020-01-08: 15 [IU] via SUBCUTANEOUS
  Administered 2020-01-08 (×2): 7 [IU] via SUBCUTANEOUS
  Administered 2020-01-08 – 2020-01-09 (×2): 4 [IU] via SUBCUTANEOUS
  Administered 2020-01-09: 12:00:00 3 [IU] via SUBCUTANEOUS
  Administered 2020-01-09: 4 [IU] via SUBCUTANEOUS

## 2020-01-08 MED ORDER — STERILE WATER FOR IRRIGATION IR SOLN
Status: DC | PRN
  Administered 2020-01-08: 2000 mL

## 2020-01-08 MED ORDER — PROPOFOL 10 MG/ML IV BOLUS
INTRAVENOUS | Status: AC
  Filled 2020-01-08: qty 40

## 2020-01-08 MED ORDER — GABAPENTIN 300 MG PO CAPS
300.0000 mg | ORAL_CAPSULE | Freq: Two times a day (BID) | ORAL | Status: DC
  Administered 2020-01-09: 300 mg via ORAL
  Filled 2020-01-08: qty 1

## 2020-01-08 MED ORDER — OXYCODONE HCL 5 MG/5ML PO SOLN: 5.0000 mg | Freq: Once | ORAL | Status: DC | PRN

## 2020-01-08 MED ORDER — MIDAZOLAM HCL 2 MG/2ML IJ SOLN
INTRAMUSCULAR | Status: AC
  Filled 2020-01-08: qty 2

## 2020-01-08 MED ORDER — OXYCODONE HCL 5 MG PO TABS: 5.0000 mg | ORAL_TABLET | Freq: Once | ORAL | Status: DC | PRN

## 2020-01-08 MED ORDER — BUPIVACAINE-EPINEPHRINE (PF) 0.25% -1:200000 IJ SOLN
INTRAMUSCULAR | Status: AC
  Filled 2020-01-08: qty 30

## 2020-01-08 MED ORDER — ONDANSETRON HCL 4 MG/2ML IJ SOLN
INTRAMUSCULAR | Status: AC
  Filled 2020-01-08: qty 2

## 2020-01-08 MED ORDER — SODIUM CHLORIDE 0.9 % IV SOLN
2.0000 g | INTRAVENOUS | Status: AC
  Administered 2020-01-08: 3 g via INTRAVENOUS
  Filled 2020-01-08: qty 2

## 2020-01-08 MED ORDER — INSULIN GLARGINE 100 UNIT/ML ~~LOC~~ SOLN
15.0000 [IU] | Freq: Every day | SUBCUTANEOUS | Status: DC
  Administered 2020-01-08: 22:00:00 15 [IU] via SUBCUTANEOUS
  Filled 2020-01-08: qty 0.15

## 2020-01-08 MED ORDER — ROCURONIUM BROMIDE 10 MG/ML (PF) SYRINGE
PREFILLED_SYRINGE | INTRAVENOUS | Status: AC
  Filled 2020-01-08: qty 10

## 2020-01-08 MED ORDER — GABAPENTIN 300 MG PO CAPS
300.0000 mg | ORAL_CAPSULE | ORAL | Status: AC
  Administered 2020-01-08: 300 mg via ORAL
  Filled 2020-01-08: qty 1

## 2020-01-08 MED ORDER — TISSEEL VH 10 ML EX KIT
PACK | CUTANEOUS | Status: DC | PRN
  Administered 2020-01-08: 1

## 2020-01-08 MED ORDER — KCL IN DEXTROSE-NACL 20-5-0.45 MEQ/L-%-% IV SOLN
INTRAVENOUS | Status: DC
  Filled 2020-01-08: qty 1000

## 2020-01-08 MED ORDER — BUPIVACAINE LIPOSOME 1.3 % IJ SUSP
INTRAMUSCULAR | Status: DC | PRN
  Administered 2020-01-08: 20 mL

## 2020-01-08 MED ORDER — DEXAMETHASONE SODIUM PHOSPHATE 10 MG/ML IJ SOLN
INTRAMUSCULAR | Status: DC | PRN
  Administered 2020-01-08: 6 mg via INTRAVENOUS

## 2020-01-08 MED ORDER — CHLORHEXIDINE GLUCONATE 0.12 % MT SOLN
15.0000 mL | Freq: Once | OROMUCOSAL | Status: AC
  Administered 2020-01-08: 15 mL via OROMUCOSAL

## 2020-01-08 MED ORDER — ORAL CARE MOUTH RINSE: 15.0000 mL | Freq: Once | OROMUCOSAL | Status: AC

## 2020-01-08 MED ORDER — CHLORHEXIDINE GLUCONATE 4 % EX LIQD: 60.0000 mL | Freq: Once | CUTANEOUS | Status: DC

## 2020-01-08 MED ORDER — ACETAMINOPHEN 500 MG PO TABS
1000.0000 mg | ORAL_TABLET | Freq: Once | ORAL | Status: AC
  Administered 2020-01-08: 1000 mg via ORAL
  Filled 2020-01-08: qty 2

## 2020-01-08 MED ORDER — PHENOL 1.4 % MT LIQD
1.0000 | OROMUCOSAL | Status: DC | PRN
  Filled 2020-01-08 (×2): qty 177

## 2020-01-08 MED ORDER — SODIUM CHLORIDE (PF) 0.9 % IJ SOLN
INTRAMUSCULAR | Status: AC
  Filled 2020-01-08: qty 20

## 2020-01-08 MED ORDER — INSULIN ASPART 100 UNIT/ML ~~LOC~~ SOLN
SUBCUTANEOUS | Status: AC
  Filled 2020-01-08: qty 1

## 2020-01-08 MED ORDER — MECLIZINE HCL 25 MG PO TABS: 25.0000 mg | ORAL_TABLET | Freq: Two times a day (BID) | ORAL | Status: DC | PRN

## 2020-01-08 MED ORDER — PANTOPRAZOLE SODIUM 40 MG IV SOLR
40.0000 mg | Freq: Every day | INTRAVENOUS | Status: DC
  Administered 2020-01-08: 22:00:00 40 mg via INTRAVENOUS
  Filled 2020-01-08: qty 40

## 2020-01-08 MED ORDER — TEMAZEPAM 15 MG PO CAPS: 30.0000 mg | ORAL_CAPSULE | Freq: Every evening | ORAL | Status: DC | PRN

## 2020-01-08 MED ORDER — ENSURE MAX PROTEIN PO LIQD
2.0000 [oz_av] | ORAL | Status: DC
  Administered 2020-01-09 (×4): 2 [oz_av] via ORAL

## 2020-01-08 MED ORDER — APREPITANT 40 MG PO CAPS
40.0000 mg | ORAL_CAPSULE | ORAL | Status: AC
  Administered 2020-01-08: 40 mg via ORAL
  Filled 2020-01-08: qty 1

## 2020-01-08 MED ORDER — ACETAMINOPHEN 500 MG PO TABS: 1000.0000 mg | ORAL_TABLET | Freq: Three times a day (TID) | ORAL | Status: DC

## 2020-01-08 MED ORDER — LISINOPRIL 5 MG PO TABS
5.0000 mg | ORAL_TABLET | Freq: Every day | ORAL | Status: DC
  Administered 2020-01-09: 5 mg via ORAL
  Filled 2020-01-08: qty 1

## 2020-01-08 MED ORDER — LIDOCAINE 20MG/ML (2%) 15 ML SYRINGE OPTIME
INTRAMUSCULAR | Status: DC | PRN
  Administered 2020-01-08: 1.5 mg/kg/h via INTRAVENOUS

## 2020-01-08 MED ORDER — HEPARIN SODIUM (PORCINE) 5000 UNIT/ML IJ SOLN
5000.0000 [IU] | INTRAMUSCULAR | Status: AC
  Administered 2020-01-08: 5000 [IU] via SUBCUTANEOUS
  Filled 2020-01-08: qty 1

## 2020-01-08 MED ORDER — BUPIVACAINE-EPINEPHRINE 0.25% -1:200000 IJ SOLN
INTRAMUSCULAR | Status: DC | PRN
  Administered 2020-01-08: 30 mL

## 2020-01-08 MED ORDER — ESCITALOPRAM OXALATE 20 MG PO TABS
20.0000 mg | ORAL_TABLET | Freq: Every day | ORAL | Status: DC
  Administered 2020-01-09: 20 mg via ORAL
  Filled 2020-01-08: qty 1

## 2020-01-08 MED ORDER — MIDAZOLAM HCL 5 MG/5ML IJ SOLN
INTRAMUSCULAR | Status: DC | PRN
  Administered 2020-01-08: 2 mg via INTRAVENOUS

## 2020-01-08 MED ORDER — ALPRAZOLAM 1 MG PO TABS
1.0000 mg | ORAL_TABLET | Freq: Three times a day (TID) | ORAL | Status: DC | PRN
  Administered 2020-01-09: 05:00:00 1 mg via ORAL
  Filled 2020-01-08: qty 1

## 2020-01-08 MED ORDER — ACETAMINOPHEN 500 MG PO TABS: 1000.0000 mg | ORAL_TABLET | ORAL | Status: DC

## 2020-01-08 MED ORDER — ROCURONIUM BROMIDE 10 MG/ML (PF) SYRINGE
PREFILLED_SYRINGE | INTRAVENOUS | Status: DC | PRN
  Administered 2020-01-08: 30 mg via INTRAVENOUS
  Administered 2020-01-08: 100 mg via INTRAVENOUS

## 2020-01-08 MED ORDER — KETAMINE HCL 10 MG/ML IJ SOLN
INTRAMUSCULAR | Status: DC | PRN
  Administered 2020-01-08: 10 mg via INTRAVENOUS
  Administered 2020-01-08: 40 mg via INTRAVENOUS

## 2020-01-08 MED ORDER — LACTATED RINGERS IV SOLN
INTRAVENOUS | Status: DC
  Administered 2020-01-08: 1000 mL via INTRAVENOUS

## 2020-01-08 MED ORDER — PHENYLEPHRINE 40 MCG/ML (10ML) SYRINGE FOR IV PUSH (FOR BLOOD PRESSURE SUPPORT)
PREFILLED_SYRINGE | INTRAVENOUS | Status: AC
  Filled 2020-01-08: qty 10

## 2020-01-08 SURGICAL SUPPLY — 70 items
APPLIER CLIP ROT 13.4 12 LRG (CLIP)
BLADE SURG 15 STRL LF DISP TIS (BLADE) ×1 IMPLANT
BLADE SURG 15 STRL SS (BLADE) ×3
CABLE HIGH FREQUENCY MONO STRZ (ELECTRODE) ×3 IMPLANT
CHLORAPREP W/TINT 26 (MISCELLANEOUS) ×3 IMPLANT
CLIP APPLIE ROT 13.4 12 LRG (CLIP) IMPLANT
COVER WAND RF STERILE (DRAPES) IMPLANT
DECANTER SPIKE VIAL GLASS SM (MISCELLANEOUS) ×6 IMPLANT
DERMABOND ADVANCED (GAUZE/BANDAGES/DRESSINGS) ×2
DERMABOND ADVANCED .7 DNX12 (GAUZE/BANDAGES/DRESSINGS) ×1 IMPLANT
DEVICE SUT QUICK LOAD TK 5 (STAPLE) IMPLANT
DEVICE SUT TI-KNOT TK 5X26 (MISCELLANEOUS) IMPLANT
DEVICE SUTURE ENDOST 10MM (ENDOMECHANICALS) IMPLANT
DEVICE TI KNOT TK5 (MISCELLANEOUS)
DISSECTOR BLUNT TIP ENDO 5MM (MISCELLANEOUS) IMPLANT
ELECT L-HOOK LAP 45CM DISP (ELECTROSURGICAL) ×3
ELECT REM PT RETURN 15FT ADLT (MISCELLANEOUS) ×3 IMPLANT
ELECTRODE L-HOOK LAP 45CM DISP (ELECTROSURGICAL) ×1 IMPLANT
GLOVE BIOGEL PI IND STRL 7.0 (GLOVE) ×1 IMPLANT
GLOVE BIOGEL PI INDICATOR 7.0 (GLOVE) ×2
GLOVE SURG SYN 7.5  E (GLOVE) ×3
GLOVE SURG SYN 7.5 E (GLOVE) ×1 IMPLANT
GOWN STRL REUS W/TWL XL LVL3 (GOWN DISPOSABLE) ×9 IMPLANT
GRASPER SUT TROCAR 14GX15 (MISCELLANEOUS) IMPLANT
KIT BASIN OR (CUSTOM PROCEDURE TRAY) ×3 IMPLANT
KIT TURNOVER KIT A (KITS) IMPLANT
MARKER SKIN DUAL TIP RULER LAB (MISCELLANEOUS) ×3 IMPLANT
MAT PREVALON FULL STRYKER (MISCELLANEOUS) ×3 IMPLANT
NEEDLE SPNL 22GX3.5 QUINCKE BK (NEEDLE) ×3 IMPLANT
NS IRRIG 1000ML POUR BTL (IV SOLUTION) ×3 IMPLANT
PACK CARDIOVASCULAR III (CUSTOM PROCEDURE TRAY) ×3 IMPLANT
PACK UNIVERSAL I (CUSTOM PROCEDURE TRAY) ×3 IMPLANT
PENCIL SMOKE EVACUATOR (MISCELLANEOUS) IMPLANT
QUICK LOAD TK 5 (STAPLE)
RELOAD STAPLER BLUE 60MM (STAPLE) IMPLANT
RELOAD STAPLER GOLD 60MM (STAPLE) ×5 IMPLANT
RELOAD STAPLER GREEN 60MM (STAPLE) ×2 IMPLANT
SCISSORS LAP 5X35 DISP (ENDOMECHANICALS) IMPLANT
SEALANT SURGICAL APPL DUAL CAN (MISCELLANEOUS) ×3 IMPLANT
SET IRRIG TUBING LAPAROSCOPIC (IRRIGATION / IRRIGATOR) ×3 IMPLANT
SET TUBE SMOKE EVAC HIGH FLOW (TUBING) ×3 IMPLANT
SHEARS HARMONIC ACE PLUS 36CM (ENDOMECHANICALS) IMPLANT
SHEARS HARMONIC ACE PLUS 45CM (MISCELLANEOUS) ×3 IMPLANT
SLEEVE ADV FIXATION 5X100MM (TROCAR) ×6 IMPLANT
SLEEVE GASTRECTOMY 36FR VISIGI (MISCELLANEOUS) ×3 IMPLANT
SOL ANTI FOG 6CC (MISCELLANEOUS) ×1 IMPLANT
SOLUTION ANTI FOG 6CC (MISCELLANEOUS) ×2
SPONGE LAP 18X18 RF (DISPOSABLE) ×6 IMPLANT
STAPLER ECHELON LONG 60 440 (INSTRUMENTS) ×3 IMPLANT
STAPLER RELOAD BLUE 60MM (STAPLE)
STAPLER RELOAD GOLD 60MM (STAPLE) ×15
STAPLER RELOAD GREEN 60MM (STAPLE) ×6
SUT MNCRL AB 4-0 PS2 18 (SUTURE) ×6 IMPLANT
SUT SURGIDAC NAB ES-9 0 48 120 (SUTURE) IMPLANT
SUT VIC AB 2-0 SH 27 (SUTURE) ×3
SUT VIC AB 2-0 SH 27X BRD (SUTURE) ×1 IMPLANT
SUT VICRYL 0 TIES 12 18 (SUTURE) IMPLANT
SYR 10ML ECCENTRIC (SYRINGE) ×3 IMPLANT
SYR 20ML LL LF (SYRINGE) ×3 IMPLANT
SYR CONTROL 10ML LL (SYRINGE) ×3 IMPLANT
TOWEL OR 17X26 10 PK STRL BLUE (TOWEL DISPOSABLE) ×3 IMPLANT
TOWEL OR NON WOVEN STRL DISP B (DISPOSABLE) ×3 IMPLANT
TROCAR ADV FIXATION 11X100MM (TROCAR) ×3 IMPLANT
TROCAR ADV FIXATION 12X100MM (TROCAR) ×3 IMPLANT
TROCAR ADV FIXATION 5X100MM (TROCAR) ×3 IMPLANT
TROCAR BLADELESS 15MM (ENDOMECHANICALS) ×3 IMPLANT
TROCAR BLADELESS OPT 5 100 (ENDOMECHANICALS) ×3 IMPLANT
TROCAR XCEL NON-BLD 11X100MML (ENDOMECHANICALS) IMPLANT
TUBING CONNECTING 10 (TUBING) ×2 IMPLANT
TUBING CONNECTING 10' (TUBING) ×1

## 2020-01-08 NOTE — Progress Notes (Signed)
Inpatient Diabetes Program Recommendations  AACE/ADA: New Consensus Statement on Inpatient Glycemic Control (2015)  Target Ranges:  Prepandial:   less than 140 mg/dL      Peak postprandial:   less than 180 mg/dL (1-2 hours)      Critically ill patients:  140 - 180 mg/dL   Lab Results  Component Value Date   GLUCAP 343 (H) 01/08/2020   HGBA1C 9.7 (H) 01/01/2020    Review of Glycemic Control  Diabetes history: DM2 Outpatient Diabetes medications: Lantus 80 mg QHS, Byetta 5 mcg bid, metformin 1000 mg bid Current orders for Inpatient glycemic control: Novolog 0-20 units Q4H  CBGs: 177, 219, 260, 343 mg/dL today.  HgbA1C - 9.7%  Inpatient Diabetes Program Recommendations:     Add Lantus 15 units QHS  Spoke with pt about HgbA1C of 9.7%. Discussed going home on rapid-acting insulin and reduced basal insulin. Instructed to check blood sugars at least 4x/day and call Endo if blood sugars consistently > 200 mg/dL. Discussed hypo s/s and treatment.   Will follow while inpatient.   Thank you. Lorenda Peck, RD, LDN, CDE Inpatient Diabetes Coordinator [phone]

## 2020-01-08 NOTE — Progress Notes (Signed)
Discussed post op day goals with patient including ambulation, IS, diet progression, pain, and nausea control.  BSTOP education provided including BSTOP information guide, "Guide for Pain Management after your Bariatric Procedure".  Questions answered. 

## 2020-01-08 NOTE — Discharge Instructions (Signed)
GASTRIC BYPASS/SLEEVE  Home Care Instructions   These instructions are to help you care for yourself when you go home.  Call: If you have any problems. . Call [phone] and ask for the surgeon on call . If you need immediate help, come to the ER at [facility].  . Tell the ER staff that you are a new post-op gastric bypass or gastric sleeve patient   Signs and symptoms to report: . Severe vomiting or nausea o If you cannot keep down clear liquids for longer than 1 day, call your surgeon  . Abdominal pain that does not get better after taking your pain medication . Fever over 100.4 F with chills . Heart beating over 100 beats a minute . Shortness of breath at rest . Chest pain .  Redness, swelling, drainage, or foul odor at incision (surgical) sites .  If your incisions open or pull apart . Swelling or pain in calf (lower leg) . Diarrhea (Loose bowel movements that happen often), frequent watery, uncontrolled bowel movements . Constipation, (no bowel movements for 3 days) if this happens: Pick one o Milk of Magnesia, 2 tablespoons by mouth, 3 times a day for 2 days if needed o Stop taking Milk of Magnesia once you have a bowel movement o Call your doctor if constipation continues Or o Miralax  (instead of Milk of Magnesia) following the label instructions o Stop taking Miralax once you have a bowel movement o Call your doctor if constipation continues . Anything you think is not normal   Normal side effects after surgery: . Unable to sleep at night or unable to focus . Irritability or moody . Being tearful (crying) or depressed These are common complaints, possibly related to your anesthesia medications that put you to sleep, stress of surgery, and change in lifestyle.  This usually goes away a few weeks after surgery.  If these feelings continue, call your primary care doctor.   Wound Care:  . Surgical glue:  Looks like a clear film over your incisions and will wear  off a little at a time   . Showering: You may shower two (2) days after your surgery unless your surgeon tells you differently o Wash gently around incisions with warm soapy water, rinse well, and gently pat dry  o No tub baths until staples are removed, steri-strips fall off or glue is gone.    Medications: Marland Kitchen Medications should be liquid or crushed if larger than the size of a dime . Extended release pills (medication that release a little bit at a time through the day) should NOT be crushed or cut. (examples include XL, ER, DR, SR) . Depending on the size and number of medications you take, you may need to space (take a few throughout the day)/change the time you take your medications so that you do not over-fill your pouch (smaller stomach) . Make sure you follow-up with your primary care doctor to make medication changes needed during rapid weight loss and life-style changes . If you have diabetes, follow up with the doctor that orders your diabetes medication(s) within one week after surgery and check your blood sugar regularly. . Do not drive while taking prescription pain medication  . It is ok to take Tylenol by the bottle instructions with your pain medicine or instead of your pain medicine as needed.  DO NOT TAKE NSAIDS (EXAMPLES OF NSAIDS:  IBUPROFREN/ NAPROXEN)  Diet:  First 2 Weeks  You will see the dietician t about two (2) weeks after your surgery. The dietician will increase the types of foods you can eat if you are handling liquids well: Marland Kitchen If you have severe vomiting or nausea and cannot keep down clear liquids lasting longer than 1 day, call your surgeon @ [phone]) Protein Shake . Drink at least 2 ounces of shake 5-6 times per day . Each serving of protein shakes (usually 8 - 12 ounces) should have: o 15 grams of protein  o And no more than 5 grams of carbohydrate  . Goal for protein each day: o Men = 80 grams per day o Women = 60 grams per  day . Protein powder may be added to fluids such as non-fat milk or Lactaid milk or unsweetened Soy/Almond milk (limit to 35 grams added protein powder per serving)  Hydration . Slowly increase the amount of water and other clear liquids as tolerated (See Acceptable Fluids) . Slowly increase the amount of protein shake as tolerated  .  Sip fluids slowly and throughout the day.  Do not use straws. . May use sugar substitutes in small amounts (no more than 6 - 8 packets per day; i.e. Splenda)  Fluid Goal . The first goal is to drink at least 8 ounces of protein shake/drink per day (or as directed by the nutritionist); some examples of protein shakes are [employer] Johnson, [employer]R [employer], EAS Edge HP, and Unjury. See handout from pre-op Bariatric Education Class: o Slowly increase the amount of protein shake you drink as tolerated o You may find it easier to slowly sip shakes throughout the day o It is important to get your proteins in first . Your fluid goal is to drink 64 - 100 ounces of fluid daily o It may take a few weeks to build up to this . 32 oz (or more) should be clear liquids  And  . 32 oz (or more) should be full liquids (see below for examples) . Liquids should not contain sugar, caffeine, or carbonation  Clear Liquids: . Water or Sugar-free flavored water (i.e. Fruit H2O, Propel) . Decaffeinated coffee or tea (sugar-free) . [employer], [employer], Minute [facility] . Sugar-free Jell-O . Bouillon or broth . Sugar-free Popsicle:   *Less than 20 calories each; Limit 1 per day  Full Liquids: Protein Shakes/Drinks + 2 choices per day of other full liquids . Full liquids must be: o No More Than 15 grams of Carbs per serving  o No More Than 3 grams of Fat per serving . Strained low-fat cream soup (except Cream of Potato or Tomato) . Non-Fat milk . Fat-free Lactaid Milk . Unsweetened Soy Or Unsweetened Almond Milk . Low Sugar yogurt (Dannon Lite & Fit, [place] yogurt; Oikos  Triple Zero; Chobani Simply 100; Yoplait 100 calorie [place] - No Fruit on the Bottom)    Vitamins and Minerals . Start 1 day after surgery unless otherwise directed by your surgeon . Chewable Bariatric Specific Multivitamin / Multimineral Supplement with iron (Example: Bariatric Advantage Multi EA) . Chewable Calcium with Vitamin D-3 (Example: 3 Chewable Calcium Plus 600 with Vitamin D-3) o Take 500 mg three (3) times a day for a total of 1500 mg each day o Do not take all 3 doses of calcium at one time as it may cause constipation, and you can only absorb 500 mg  at a time  o Do not mix multivitamins containing iron with calcium supplements; take 2  hours apart . Menstruating women and those with a history of anemia (a blood disease that causes weakness) may need extra iron o Talk with your doctor to see if you need more iron . Do not stop taking or change any vitamins or minerals until you talk to your dietitian or surgeon . Your Dietitian and/or surgeon must approve all vitamin and mineral supplements   Activity and Exercise: Limit your physical activity as instructed by your doctor.  It is important to continue walking at home.  During this time, use these guidelines: . Do not lift anything greater than ten (10) pounds for at least two (2) weeks . Do not go back to work or drive until Engineer, production says you can . You may have sex when you feel comfortable  o It is VERY important for female patients to use a reliable birth control method; fertility often increases after surgery  o All hormonal birth control will be ineffective for 30 days after surgery due to medications given during surgery a barrier method must be used. o Do not get pregnant for at least 18 months . Start exercising as soon as your doctor tells you that you can o Make sure your doctor approves any physical activity . Start with a simple walking program . Walk 5-15 minutes each day, 7 days per week.  . Slowly increase  until you are walking 30-45 minutes per day Consider joining our [facility] program. [phone] or email [email or web address] [email or web address]   Special Instructions Things to remember: . Use your CPAP when sleeping if this applies to you  . [facility] has two free Bariatric Surgery Support Groups that meet monthly o The 3rd Thursday of each month, 6 pm o The 2nd Friday of each month, 11:30 . It is very important to keep all follow up appointments with your surgeon, dietitian, primary care physician, and behavioral health practitioner . Routine follow up schedule with your surgeon include appointments at 2-3 weeks, 6-8 weeks, 6 months, and 1 year at a minimum.  Your surgeon may request to see you more often.   . After the first year, please follow up with your bariatric surgeon and dietitian at least once a year in order to maintain best weight loss results   [facility]: [place]: [phone] Bariatric Nurse Coordinator: [phone]      Reviewed and Endorsed  by [facility] Patient Education Committee, June, 2016 Edits Approved: Aug, 2018

## 2020-01-08 NOTE — Interval H&P Note (Signed)
History and Physical Interval Note:  01/08/2020 7:16 AM  [name]  has presented today for surgery, with the diagnosis of morbid obesity.  The various methods of treatment have been discussed with the patient and family.   He husband is here with her.   After consideration of risks, benefits and other options for treatment, the patient has consented to  Procedure(s): LAPAROSCOPIC SLEEVE GASTRECTOMY (N/A) UPPER GASTROINTESTINAL ENDOSCOPY (N/A) REMOVAL OF GASTRIC ADJUSTABLE BAND AND COMPONENTS (N/A) as a surgical intervention.  The patient's history has been reviewed, patient examined, no change in status, stable for surgery.  I have reviewed the patient's chart and labs.  Questions were answered to the patient's satisfaction.     Shann Medal

## 2020-01-08 NOTE — Addendum Note (Signed)
Addendum  created 01/08/20 1104 by Lissa Morales, CRNA   Charge Capture section accepted

## 2020-01-08 NOTE — Anesthesia Procedure Notes (Signed)
Procedure Name: Intubation Date/Time: 01/08/2020 7:31 AM Performed by: Lissa Morales, CRNA Pre-anesthesia Checklist: Patient identified, Emergency Drugs available, Suction available and Patient being monitored Patient Re-evaluated:Patient Re-evaluated prior to induction Oxygen Delivery Method: Circle system utilized Preoxygenation: Pre-oxygenation with 100% oxygen Induction Type: IV induction Ventilation: Mask ventilation without difficulty Laryngoscope Size: Mac and 4 Tube type: Oral Tube size: 7.0 mm Number of attempts: 1 Airway Equipment and Method: Stylet and Oral airway Placement Confirmation: ETT inserted through vocal cords under direct vision,  positive ETCO2 and breath sounds checked- equal and bilateral Tube secured with: Tape Dental Injury: Teeth and Oropharynx as per pre-operative assessment

## 2020-01-08 NOTE — Progress Notes (Signed)
PHARMACY CONSULT FOR:  Risk Assessment for Post-Discharge VTE Following Bariatric Surgery  Post-Discharge VTE Risk Assessment: This patient's probability of 30-day post-discharge VTE is increased due to the factors marked:   Female   x Age >/=60 years    BMI >/=50 kg/m2    CHF    Dyspnea at Rest    Paraplegia   x Non-gastric-band surgery    Operation Time >/=3 hr    Return to OR     Length of Stay >/= 3 d   Hx of VTE   Hypercoagulable condition   Significant venous stasis       Predicted probability of 30-day post-discharge VTE: 0.31%  Other patient-specific factors to consider:   Recommendation for Discharge: No pharmacologic prophylaxis post-discharge     [name] is a 59 y.o. female who underwent laparoscopic sleeve gastrectomy  on 01/08/20   Case start: 0750 Case end: 0954   Allergies  Allergen Reactions  . Bactrim Itching, Nausea And Vomiting and Other (See Comments)    Redness    Patient Measurements: Height: 5' 6.5" (168.9 cm) Weight: (!) 137.4 kg (303 lb) IBW/kg (Calculated) : 60.45 Body mass index is 48.17 kg/m.  No results for input(s): WBC, HGB, HCT, PLT, APTT, CREATININE, LABCREA, CREATININE, CREAT24HRUR, MG, PHOS, ALBUMIN, PROT, ALBUMIN, AST, ALT, ALKPHOS, BILITOT, BILIDIR, IBILI in the last 72 hours. Estimated Creatinine Clearance: 107.8 mL/min (by C-G formula based on SCr of 0.67 mg/dL).    Past Medical History:  Diagnosis Date  . Adenomatous polyp 2009  . Anemia 2003 after surgery needed 1 unit blood  . Anxiety   . Arthritis   . Bipolar 1 disorder ([place])   . Constipation   . Depression   . Diabetes mellitus   . Diabetes mellitus, type II ([place])   . Diverticula of colon 2009  . Generalized headaches   . GERD (gastroesophageal reflux disease)   . History of hiatal hernia    small  . Hypertension   . Migraine   . Neuropathy    both feet  . Obesity   . Pelvic floor dysfunction    abnormal anorectal manometry at [facility] [place] in 2009   . Plantar fasciitis both feet  . Sleep apnea    cpap setting of 3.5     Medications Prior to Admission  Medication Sig Dispense Refill Last Dose  . ALPRAZolam (XANAX) 1 MG tablet Take 1 tablet (1 mg total) by mouth 3 (three) times daily as needed for anxiety. 270 tablet 1 01/07/2020 at Unknown time  . ARIPiprazole (ABILIFY) 10 MG tablet Take 1 tablet (10 mg total) by mouth at bedtime. 90 tablet 2 01/07/2020 at Unknown time  . ascorbic acid (VITAMIN C) 500 MG tablet Take 500 mg by mouth daily.   Past Week at Unknown time  . cycloSPORINE (RESTASIS) 0.05 % ophthalmic emulsion Place 2 drops into both eyes 2 (two) times daily.    01/08/2020 at 0200  . escitalopram (LEXAPRO) 20 MG tablet Take 1 tablet (20 mg total) by mouth daily. 90 tablet 2 01/07/2020 at Unknown time  . exenatide (BYETTA) 5 MCG/0.02ML SOPN injection Inject 5 mcg into the skin 2 (two) times daily with a meal.   01/07/2020 at Unknown time  . fluticasone (FLONASE) 50 MCG/ACT nasal spray Place 2 sprays into the nose daily as needed for allergies.    01/07/2020 at Unknown time  . gabapentin (NEURONTIN) 300 MG capsule Take 300 mg by mouth 2 (two) times daily.   01/07/2020  at Unknown time  . insulin glargine (LANTUS SOLOSTAR) 100 UNIT/ML Solostar Pen Inject 80 Units into the skin at bedtime. 72 mL 0 01/07/2020 at 1900  . lisinopril (ZESTRIL) 5 MG tablet Take 5 mg by mouth daily.   01/07/2020 at Unknown time  . loratadine (CLARITIN) 10 MG tablet Take 10 mg by mouth daily.   01/07/2020 at Unknown time  . meclizine (ANTIVERT) 25 MG tablet Take 25 mg by mouth 2 (two) times daily as needed for dizziness.   2 Past Month at Unknown time  . Menthol, Topical Analgesic, (ICY HOT EX) Apply 1 application topically 4 (four) times daily as needed (joint pain.).   01/08/2020 at Unknown time  . metFORMIN (GLUCOPHAGE) 1000 MG tablet Take 1,000 mg by mouth 2 (two) times daily.   01/07/2020 at Unknown time  . methocarbamol (ROBAXIN) 500 MG tablet Take 500 mg by mouth  3 (three) times daily as needed for muscle spasms.    Past Week at Unknown time  . Multiple Vitamin (MULTIVITAMIN WITH MINERALS) TABS tablet Take 1 tablet by mouth daily.   01/07/2020 at Unknown time  . ondansetron (ZOFRAN ODT) 4 MG disintegrating tablet Take 1 tablet (4 mg total) by mouth every 8 (eight) hours as needed for nausea or vomiting. 10 tablet 0 Past Month at Unknown time  . pantoprazole (PROTONIX) 40 MG tablet Take 1 tablet (40 mg total) by mouth 2 (two) times daily. (Patient taking differently: Take 40 mg by mouth daily before breakfast. ) 180 tablet 3 01/07/2020 at Unknown time  . Probiotic CHEW Chew 1-2 capsules by mouth daily. *Hold for diarrhea* (Gummy)   01/07/2020 at Unknown time  . Propylene Glycol-Glycerin (SOOTHE) 0.6-0.6 % SOLN Place 1 drop into both eyes 2 (two) times daily.   01/07/2020 at Unknown time  . rizatriptan (MAXALT-MLT) 10 MG disintegrating tablet Take 10 mg by mouth as needed for migraine. May repeat in 2 hours if needed   Past Month at Unknown time  . temazepam (RESTORIL) 30 MG capsule Take 1 capsule (30 mg total) by mouth at bedtime as needed for sleep. 90 capsule 2 01/07/2020 at Unknown time  . Blood Glucose Monitoring Suppl (FREESTYLE LITE) DEVI Use to measure glucose 4 times a day 1 each 0 supply  . Continuous Blood Gluc Receiver (FREESTYLE LIBRE 2 READER) DEVI As directed 1 each 0 supply  . Continuous Blood Gluc Sensor (FREESTYLE LIBRE 2 SENSOR) MISC 1 Piece by Does not apply route every 14 (fourteen) days. 2 each 3 supply  . glucose blood test strip Test 4 times a day, she has freestyle lite meter 150 each 2 supply  . Insulin Pen Needle (B-D ULTRAFINE III SHORT PEN) 31G X 8 MM MISC 1 each by Does not apply route as directed. 100 each 3 supply       Kara Mead 01/08/2020,11:32 AM

## 2020-01-08 NOTE — Transfer of Care (Signed)
Immediate Anesthesia Transfer of Care Note  Patient: [name]  Procedure(s) Performed: LAPAROSCOPIC SLEEVE GASTRECTOMY (N/A ) UPPER GASTROINTESTINAL ENDOSCOPY (N/A ) REMOVAL OF GASTRIC ADJUSTABLE BAND AND COMPONENTS (N/A )  Patient Location: PACU  Anesthesia Type:General  Level of Consciousness: awake, alert , oriented and patient cooperative  Airway & Oxygen Therapy: Patient Spontanous Breathing and Patient connected to face mask oxygen  Post-op Assessment: Report given to RN, Post -op Vital signs reviewed and stable and Patient moving all extremities X 4  Post vital signs: stable  Last Vitals:  Vitals Value Taken Time  BP 142/82 01/08/20 1008  Temp    Pulse 76 01/08/20 1014  Resp 13 01/08/20 1014  SpO2 100 % 01/08/20 1014  Vitals shown include unvalidated device data.  Last Pain:  Vitals:   01/08/20 0637  TempSrc:   PainSc: 9       Patients Stated Pain Goal: 8 (45/40/98 1191)  Complications: No complications documented.

## 2020-01-08 NOTE — Progress Notes (Signed)
Pt set up on cpap 4 cmh20 with 3l o2 bled in.

## 2020-01-08 NOTE — Op Note (Signed)
[name] [id number] 23-Jul-1960 01/08/2020  Preoperative diagnosis: h/o laparoscopic adjustable gastric band, severe obesity  Postoperative diagnosis: Same   Procedure: upper endoscopy   Surgeon: Leighton Ruff. Daphine Loch M.D., FACS   Anesthesia: Gen.   Indications for procedure: 59 y.o. year old female undergoing conversion from LAGB to Laparoscopic Gastric Sleeve Resection and an EGD was requested to evaluate the new gastric sleeve.   Description of procedure: After we have completed the sleeve resection, I scrubbed out and obtained the Olympus endoscope. I gently placed endoscope in the patient's oropharynx and gently glided it down the esophagus without any difficulty under direct visualization. Once I was in the gastric sleeve, I insufflated the stomach with air. I was able to cannulate and advanced the scope through the gastric sleeve. I was able to cannulate the duodenum with ease. Dr. Lucia Gaskins had placed saline in the upper abdomen. Upon further insufflation of the gastric sleeve there was no evidence of bubbles. GE junction located at 38 cm.  Upon further inspection of the gastric sleeve, the mucosa appeared normal. There is no evidence of any mucosal abnormality. The sleeve was widely patent at the angularis. There was no evidence of bleeding. The gastric sleeve was decompressed. The scope was withdrawn. The patient tolerated this portion of the procedure well. Please see Dr Pollie Friar operative note for details regarding the laparoscopic gastric sleeve resection.   Leighton Ruff. Redmond Pulling, MD, FACS  General, Bariatric, & Minimally Invasive Surgery  [facility], [place]

## 2020-01-08 NOTE — Anesthesia Postprocedure Evaluation (Signed)
Anesthesia Post Note  Patient: [name]  Procedure(s) Performed: LAPAROSCOPIC SLEEVE GASTRECTOMY (N/A ) UPPER GASTROINTESTINAL ENDOSCOPY (N/A ) REMOVAL OF GASTRIC ADJUSTABLE BAND AND COMPONENTS (N/A )     Patient location during evaluation: PACU Anesthesia Type: General Level of consciousness: awake and alert, oriented and patient cooperative Pain management: pain level controlled Vital Signs Assessment: post-procedure vital signs reviewed and stable Respiratory status: spontaneous breathing, nonlabored ventilation and respiratory function stable Cardiovascular status: blood pressure returned to baseline and stable Postop Assessment: no apparent nausea or vomiting Anesthetic complications: no   No complications documented.  Last Vitals:  Vitals:   01/08/20 1030 01/08/20 1045  BP: (!) 157/89 (!) 169/94  Pulse: 72 67  Resp: 14 10  Temp:    SpO2: 97% 98%    Last Pain:  Vitals:   01/08/20 1030  TempSrc:   PainSc: [place]

## 2020-01-08 NOTE — Op Note (Addendum)
PATIENT:   [name] DOB:   08-Sep-1960 MRN:   [id number]  DATE OF PROCEDURE: 01/08/2020                   FACILITY:  [facility]  OPERATIVE REPORT  PREOPERATIVE DIAGNOSIS:  Morbid obesity, failed laparoscopic gastric band  POSTOPERATIVE DIAGNOSIS:  Morbid obesity (weight 137 kg, BMI of 48.1), failed laparoscopic gastric band  PROCEDURE:  Removal of laparoscopic band, laparoscopic conversion to sleeve gastrectomy (intraoperative upper endoscopy by Dr. Ok Anis)  SURGEON:  Fenton Malling. Lucia Gaskins, MD  FIRST ASSISTANTOk Anis, MD  ANESTHESIA:  General endotracheal.  Anesthesiologist: Pervis Hocking, DO CRNA: Lissa Morales, CRNA; Eben Burow, CRNA  General  ESTIMATED BLOOD LOSS:  Minimal.  LOCAL ANESTHESIA:  30 cc of 1/4% Marcaine and 20 cc of Exparel  COMPLICATIONS:  None.  INDICATION FOR SURGERY:  [name] is a 59 y.o. white female who sees Sharilyn Sites, MD as her primary care doctor.  She had a lap band placed in 03/19/2008 and had successful weight loss early after surgery.  But over the last few years she has gained weight and we have failed to make the lap band work for her weight loss.  She has completed our preoperative bariatric program and now comes for removal of her lap band and conversion to a sleeve gastrectomy.  The indications, potential complications of surgery were explained to the patient.  Potential complications of the surgery include, but are not limited to, bleeding, infection, DVT, open surgery, and long-term nutritional consequences.  OPERATIVE NOTE:  The patient taken to room #1 at [facility] where Ms. Adah Perl underwent a general endotracheal anesthetic, supervised by Anesthesiologist: Pervis Hocking, DO CRNA: Lissa Morales, CRNA; Eben Burow, CRNA.  The patient was given 3 gm of cefotetan at the beginning of the procedure.  A time-out was held and surgical checklist run.  I accessed her abdominal cavity through the left upper  quadrant with a 5 mm Optiview. I did an abdominal exploration.   She had adhesion to her anterior abdominal wall.  I took 15 minutes lysing the adhesion to anterior abdominal wall to create space for my upper abdominal trocars.  I did not take down all the adhesions and there were a lot a adhesions below the level of the umbilicus.  The right and left lobes of the liver were fatty. Gallbladder was normal.   I placed a total of 5 additional trocars. I placed a 5 mm left lateral trocar, a 5 mm left paramedian trocar (for the scope), a 15 mm right paramedian torcar, a 5 mm right subcostal trocar, and 5 mm subxiphoid trocar for the liver retractor.  I placed a abdominal wall anesthetic block using a mixture of 1/4% Marcaine and Exparel.  I used 20 cc per side, for a total of 40 cc.  I first took down the adhesions around the lap band.  This helped mobilize the lap band.  I unbuckled the lap band and removed the band around the proximal stomach.  This balloon part was removed through the 15 mm trocar.  I cut the tubing and removed most of the intra-adbominal tubing.  There were proximal adhesions that I took down where the lap band had been located and I cut the cicatrix where the lap band impinged on the stomach.  I then turned my attention to the sleeve gastrectomy.  She had a "J" shaped stomach.  I took down  the greater curvature attachments of the stomach. I measured approximately 6 cm proximal from the pylorus and mobilized the greater curvature of the stomach with the Harmonic Scalpel. I took this dissection cranially around the greater curvature of her stomach to the angle of His and the left crus.   I went lateral to the scar tissue that involved the lap band.   After I had mobilized the greater curvature of the stomach, I then passed the 36 French ViSiGi bougie which was used to suck the stomach up against the lesser curvature and the tip of the ViSiGi was placed into the antrum. During the staple  firing,  I tried to give the [place] a cuff at least about 1 cm. I tried to avoid narrowing the incisura. I used a total of 7 staple firings.  From antrum to the angle of His, I used 2 green, 5 gold and 0 blue [place] 60 mm Ethicon staplers. I did not use staple line reinforcement.   At each firing of the EndoGIA stapler, I inspected the stomach, anterior wall of the stomach, and underneath to make sure there was no compromise or impingement on to the ViSiGi bougie.   The staple line seemed linear without any corkscrewing of the stomach. Hemostasis was good. I did not use any reinforcement. She had no areas of bleeding which needed a clip.  Because I thought we had a good staple line, I then had the ViSiGi was converted to insufflate the pouch. The new stomach pouch was placed under water. There was no bubbling or leak noted.   At this point, Dr. Redmond Pulling broke scrub and passed an upper endoscope down through the esophagus into the stomach pouch. The stomach was tubular. There was no narrowing of the stomach pouch or angulation. We were easily able to pass the endoscope into the antrum and again put air pressure on the staple line. I irrigated the upper abdomen with saline. There was no bubbling or evidence of air leak. The mucosa looked viable. Dr. Redmond Pulling decompressed the stomach with the endoscopy.   I extracted the stomach remnant through the 15 mm right paramedial trocar intact and sent this to pathology. I then placed 10 cc of Tisseel along the new greater curvature staple line and covered the entire staple line with the Tisseel.  I aspirated out the saline that I had irrigated because I thought the staple line looked viable and complete. There was no evidence of leak. I did not leave a drain in place.   I removed the port of the lap band without difficulty.  It did not have a backing.  Then, I closed the trocar sites. I placed 0 Vicryl sutures at the 15-mm port site in the right abdomen. The other  port sites seemed smaller not requiring sutures. I closed the skin at each site with a 4-0 Monocryl, painted each wound with Dermabond.   I have a surgeon as a first assist to retract, expose, and assist on this difficult operation.  The patient was transported to recovery room in good condition. Sponge and needle count were correct at the end of the case.     Left upper photo:  Sleeve gastrectomy with remnant to be removed to the right Right upper photo:  Sleeve gastrectomy  Alphonsa Overall, MD, [facility] Pager: [phone] Office phone:  [phone]

## 2020-01-09 ENCOUNTER — Encounter (HOSPITAL_COMMUNITY): Payer: Self-pay | Admitting: Surgery

## 2020-01-09 LAB — SURGICAL PATHOLOGY

## 2020-01-09 LAB — CBC WITH DIFFERENTIAL/PLATELET
Abs Immature Granulocytes: 0.1 10*3/uL — ABNORMAL HIGH (ref 0.00–0.07)
Basophils Absolute: 0 10*3/uL (ref 0.0–0.1)
Basophils Relative: 0 %
Eosinophils Absolute: 0.1 10*3/uL (ref 0.0–0.5)
Eosinophils Relative: 1 %
HCT: 41 % (ref 36.0–46.0)
Hemoglobin: 12.8 g/dL (ref 12.0–15.0)
Immature Granulocytes: 1 %
Lymphocytes Relative: 21 %
Lymphs Abs: 2.3 10*3/uL (ref 0.7–4.0)
MCH: 28.5 pg (ref 26.0–34.0)
MCHC: 31.2 g/dL (ref 30.0–36.0)
MCV: 91.3 fL (ref 80.0–100.0)
Monocytes Absolute: 0.9 10*3/uL (ref 0.1–1.0)
Monocytes Relative: 8 %
Neutro Abs: 7.7 10*3/uL (ref 1.7–7.7)
Neutrophils Relative %: 69 %
Platelets: 215 10*3/uL (ref 150–400)
RBC: 4.49 MIL/uL (ref 3.87–5.11)
RDW: 14.2 % (ref 11.5–15.5)
WBC: 11 10*3/uL — ABNORMAL HIGH (ref 4.0–10.5)
nRBC: 0 % (ref 0.0–0.2)

## 2020-01-09 LAB — GLUCOSE, CAPILLARY
Glucose-Capillary: 153 mg/dL — ABNORMAL HIGH (ref 70–99)
Glucose-Capillary: 154 mg/dL — ABNORMAL HIGH (ref 70–99)
Glucose-Capillary: 138 mg/dL — ABNORMAL HIGH (ref 70–99)

## 2020-01-09 MED ORDER — TRAMADOL HCL 50 MG PO TABS: 50.0000 mg | ORAL_TABLET | Freq: Four times a day (QID) | ORAL | 0 refills | Status: DC | PRN

## 2020-01-09 MED ORDER — PNEUMOCOCCAL VAC POLYVALENT 25 MCG/0.5ML IJ INJ
0.5000 mL | INJECTION | INTRAMUSCULAR | Status: AC
  Administered 2020-01-09: 15:00:00 0.5 mL via INTRAMUSCULAR
  Filled 2020-01-09: qty 0.5

## 2020-01-09 NOTE — Progress Notes (Signed)
Nutrition Note  RD consulted for diet education for patient s/p bariatric surgery. Bariatric nurse coordinator providing education.  If nutrition issues arise, please consult RD.   Samantha Morrish, MS, RD, LDN Inpatient Clinical Dietitian Contact information available via Amion  

## 2020-01-09 NOTE — Progress Notes (Signed)
Pt completed her 6 cups of water. No c/o nausea. Pt tolerated water fine. C/o gas pain ,encourage to ambulate .

## 2020-01-09 NOTE — Progress Notes (Signed)
Patient alert and oriented, pain is controlled. Patient is tolerating fluids, advanced to protein shake today, patient is tolerating well.  Reviewed Gastric sleeve discharge instructions with patient and patient is able to articulate understanding.  Provided information on BELT program, Support Group and [facility] outpatient pharmacy. All questions answered, will continue to monitor.  Total fluid intake 660 Per dehydration protocol callback one week post op 

## 2020-01-09 NOTE — Progress Notes (Signed)
Patient alert and oriented, Post op day 1.  Provided support and encouragement.  Encouraged pulmonary toilet, ambulation and small sips of liquids.   Completed 12 ounces of bari clear fluid started protein.  Denies nausea/pain.  All questions answered.  Will continue to monitor.

## 2020-01-09 NOTE — Progress Notes (Signed)
Inpatient Diabetes Program Recommendations  AACE/ADA: New Consensus Statement on Inpatient Glycemic Control (2015)  Target Ranges:  Prepandial:   less than 140 mg/dL      Peak postprandial:   less than 180 mg/dL (1-2 hours)      Critically ill patients:  140 - 180 mg/dL   Lab Results  Component Value Date   GLUCAP 154 (H) 01/09/2020   HGBA1C 9.7 (H) 01/01/2020    Review of Glycemic Control  CBGs today: 153, 154 mg/dL  Current orders for Inpatient glycemic control: Lantus 15 units QHS, Novolog 0-20 units Q4H  Inpatient Diabetes Program Recommendations:     For discharge:  Lantus Solostar 15 units QHS (pt has pens at home)  Novolin (ReliOn) Regular Flexpen 0-20 units tidwc and hs ([facility] for $44)    Order # [id number]   Pt to call Endo if blood sugars consistently > 200.  Thank you. Lorenda Peck, RD, LDN, CDE Inpatient Diabetes Coordinator [phone]

## 2020-01-09 NOTE — Progress Notes (Signed)
[facility] Office:  [phone] General Surgery Progress Note   LOS: 1 day  POD -  1 Day Post-Op  Assessment and Plan: 1.  LAPAROSCOPIC SLEEVE GASTRECTOMY, UPPER GASTROINTESTINAL ENDOSCOPY, REMOVAL OF GASTRIC ADJUSTABLE BAND AND COMPONENTS - 01/08/2020 - Lucia Gaskins  To see how she does with po's today.  Will check later today.  2. VENTRAL HERNIA WITHOUT OBSTRUCTION OR GANGRENE  3. SLEEP APNEA On CPAP 4.  DIABETES MELLITUS TYPE 2  She sees Dr. Concha Se Nida 5.  HYPERTENSION, BENIGN (I10) 6. History of anxiety and depression Bipolar - sees Dr. Darlina Guys at [place] 7. Migraines - (not on Topamax any longer) She used to see Dr. Jannifer Franklin - but not currently - in fact, it seems that she is seeing no one for her headaches. 8. She has neuropathy in her feet. (R>L) 9. Chronic pain meds - she says  that she has chronic back pain, knee pain, and plantar fasciitis she is taking oxycodone BID 10.  DVT prophylaxis - Lovenox   Active Problems:   Morbid obesity with BMI of 45.0-49.9, adult (HCC)  Subjective:  Doing well.  She is tolerating liquids and has mobilized.  Objective:   Vitals:   01/09/20 0202 01/09/20 0549  BP: (!) 175/82 132/78  Pulse: 71 65  Resp: 16 16  Temp: 97.6 F (36.4 C) 97.7 F (36.5 C)  SpO2: 95% 100%     Intake/Output from previous day:  08/31 0701 - 09/01 0700 In: 4111.1 [P.O.:390; I.V.:3721.1] Out: 3100 [Urine:2950; Blood:150]  Intake/Output this shift:  No intake/output data recorded.   Physical Exam:   General: Obese WF who is alert and oriented.    HEENT: Normal. Pupils equal. .   Lungs: Clear.  IS = 800 cc   Abdomen: Soft.  Rare Bs   Wound: Clean   Lab Results:    Recent Labs    01/08/20 1430 01/09/20 0508  WBC  --  11.0*  HGB 13.9 12.8  HCT 42.4 41.0  PLT  --  215    BMET  No results for input(s): NA, K, CL, CO2, GLUCOSE, BUN, CREATININE, CALCIUM in the last 72  hours.  PT/INR  No results for input(s): LABPROT, INR in the last 72 hours.  ABG  No results for input(s): PHART, HCO3 in the last 72 hours.  Invalid input(s): PCO2, PO2   Studies/Results:  No results found.   Anti-infectives:   Anti-infectives (From admission, onward)   Start     Dose/Rate Route Frequency Ordered Stop   01/08/20 0600  cefoTEtan (CEFOTAN) 2 g in sodium chloride 0.9 % 100 mL IVPB        2 g 200 mL/hr over 30 Minutes Intravenous On call to O.R. 01/08/20 0536 01/08/20 [place], MD, [facility] Office: [phone] 01/09/2020

## 2020-01-09 NOTE — Discharge Summary (Signed)
Physician Discharge Summary  Patient ID:  [name]  MRN: [id number]  DOB/AGE: [birth year] 59 y.o.  Admit date: 01/08/2020 Discharge date: 01/09/2020  Discharge Diagnoses:  1.  Morbid obesity   Initial weight - 288, BMI - 45.32 2. History of reflux - this has been better since the fluid has been removed from the lap band. 3. Diabetes mellitus, She is now on insulin She sees Dr. Concha Se Nida  4. History of sleep apnea On CPAP 5. History of anxiety and depression Bipolar - sees Dr. Darlina Guys at [facility] On Abilify, Lexapro, carbamzepine, Xanax 6. Bilateral knee pain - right > left Sees Dr. Celedonio Miyamoto in [place] 7. Migraines - (not on Topamax any longer) She used to see Dr. Jannifer Franklin - but not currently - in fact, it seems that she is seeing no one for her headaches. 8. History of colon resection for diverticulitis - required colostomy and take down. 2003 - Dr. Tamala Julian 9. Chronic disability - since around 2010. 10. She has neuropathy in her feet. (R>L) 11. Dr. Buford Dresser did a colonoscopy in Jan 2018 - she had some sigmoid diverticulosis.  She has had some constipation. But she has started taking probiotic gummies and she is doing much better with her BM 12. Ventral hernia 6 x 11 cm on abdominal CT scan 03/22/2016 - not really changed on CT of 10/01/2019 13. Chronic pain meds - she says that she has chronic back pain, knee pain, and plantar fasciitis   Active Problems:   Morbid obesity with BMI of 45.0-49.9, adult ([place])  Operation: Procedure(s):  LAPAROSCOPIC SLEEVE GASTRECTOMY, UPPER GASTROINTESTINAL ENDOSCOPY REMOVAL OF GASTRIC ADJUSTABLE BAND AND COMPONENTS on 01/08/2020 - 01/09/2020 - Lucia Gaskins  Discharged Condition: good  Hospital Course: [name] is an 59 y.o. female whose primary care physician is Sharilyn Sites, MD and who was admitted 01/08/2020 with a chief  complaint of failed lap band and morbid obesity.   She was brought to the operating room on 01/08/2020 and underwent LAPAROSCOPIC SLEEVE GASTRECTOMY, UPPER GASTROINTESTINAL ENDOSCOPY REMOVAL OF GASTRIC ADJUSTABLE BAND AND COMPONENTS.  She is now one day post op, taking po's well, does not have nausea, and is ready to go home. Her husband is at her bedside.   The discharge instructions were reviewed with the patient.  Consults: None  Significant Diagnostic Studies: Results for orders placed or performed during the hospital encounter of 01/08/20  Glucose, capillary  Result Value Ref Range   Glucose-Capillary 177 (H) 70 - 99 mg/dL  Glucose, capillary  Result Value Ref Range   Glucose-Capillary 219 (H) 70 - 99 mg/dL  Hemoglobin and hematocrit, blood  Result Value Ref Range   Hemoglobin 13.9 12.0 - 15.0 g/dL   HCT 42.4 36 - 46 %  Glucose, capillary  Result Value Ref Range   Glucose-Capillary 260 (H) 70 - 99 mg/dL  Glucose, capillary  Result Value Ref Range   Glucose-Capillary 343 (H) 70 - 99 mg/dL  CBC WITH DIFFERENTIAL  Result Value Ref Range   WBC 11.0 (H) 4.0 - 10.5 K/uL   RBC 4.49 3.87 - 5.11 MIL/uL   Hemoglobin 12.8 12.0 - 15.0 g/dL   HCT 41.0 36 - 46 %   MCV 91.3 80.0 - 100.0 fL   MCH 28.5 26.0 - 34.0 pg   MCHC 31.2 30.0 - 36.0 g/dL   RDW 14.2 11.5 - 15.5 %   Platelets 215 150 - 400 K/uL   nRBC 0.0 0.0 - 0.2 %  Neutrophils Relative % 69 %   Neutro Abs 7.7 1.7 - 7.7 K/uL   Lymphocytes Relative 21 %   Lymphs Abs 2.3 0.7 - 4.0 K/uL   Monocytes Relative 8 %   Monocytes Absolute 0.9 0 - 1 K/uL   Eosinophils Relative 1 %   Eosinophils Absolute 0.1 0 - 0 K/uL   Basophils Relative 0 %   Basophils Absolute 0.0 0 - 0 K/uL   Immature Granulocytes 1 %   Abs Immature Granulocytes 0.10 (H) 0.00 - 0.07 K/uL  Glucose, capillary  Result Value Ref Range   Glucose-Capillary 234 (H) 70 - 99 mg/dL  Glucose, capillary  Result Value Ref Range   Glucose-Capillary 199 (H) 70 - 99  mg/dL  Glucose, capillary  Result Value Ref Range   Glucose-Capillary 153 (H) 70 - 99 mg/dL  Glucose, capillary  Result Value Ref Range   Glucose-Capillary 154 (H) 70 - 99 mg/dL  Glucose, capillary  Result Value Ref Range   Glucose-Capillary 138 (H) 70 - 99 mg/dL  ABO/Rh  Result Value Ref Range   ABO/RH(D)      O POS Performed at [facility], [place] [address], [place], [place] [address]   Surgical pathology  Result Value Ref Range   SURGICAL PATHOLOGY      SURGICAL PATHOLOGY CASE: [id number] PATIENT: [name] Surgical Pathology Report     Clinical History: Morbid obesity ([place])     FINAL MICROSCOPIC DIAGNOSIS:  A. STOMACH, GASTRIC SLEEVE RESECTION, GROSS DIAGNOSIS ONLY: - Portion of unremarkable stomach.   GROSS DESCRIPTION:  Received fresh is a 32 cm in length x 4 cm in diameter portion of stomach, received disrupted at one end. The serosa is smooth, tan-pink. The wall averages 0.2 cm. The mucosa is tan-pink, with normal rugal folding.  Discrete lesions are not grossly identified.  Gross only.  (AK 01/08/2020)    Final Diagnosis performed by Gillie Manners, MD.   Electronically signed 01/09/2020 Technical component performed at [facility], [place] [address], [place], [place] [address].  Professional component performed at [employer]. [facility], [place] [address], [place],  [address].  Immunohistochemistry Technical component (if applicable) was performed at [facility]. [address] [place], [place],  [address].   IMMUNOHISTOCHEMISTRY DISCLAIMER (if applicable): Some of these immunohistochemical stains may have been developed and the performance characteristics determine by [facility]. Some may not have been cleared or approved by the U.S. Food and Drug Administration. The FDA has determined that such clearance or approval is not necessary. This test is  used for clinical purposes. It should not be regarded as investigational or for research. This laboratory is certified under the [employer] (CLIA-88) as qualified to perform high complexity clinical laboratory testing.  The controls stained appropriately.     CT MAXILLOFACIAL WO CONTRAST  Result Date: 12/14/2019 CLINICAL DATA:  Chronic maxillary sinusitis EXAM: CT MAXILLOFACIAL WITHOUT CONTRAST TECHNIQUE: Multidetector CT images of the paranasal sinuses were obtained using the standard protocol without intravenous contrast. COMPARISON:  None. FINDINGS: Paranasal sinuses: Frontal: Normally aerated. Patent frontal sinus drainage pathways. Ethmoid: Normally aerated. Maxillary: Normally aerated. Sphenoid: Normally aerated. Patent sphenoethmoidal recesses. Right ostiomeatal unit: Patent.  Concha bullosa. Left ostiomeatal unit: Patent. Nasal passages: Patent. Leftward septal deviation. Anatomy: No pneumatization superior to anterior ethmoid notches. Sellar sphenoid pneumatization pattern. No dehiscence of carotid or optic canals. No onodi cell. Other: No significant incidental finding IMPRESSION: 1. Normally  aerated paranasal sinuses. Patent sinus drainage pathways. 2. Leftward septal deviation. Electronically Signed   By: Monte Fantasia M.D.   On: 12/14/2019 04:44    Discharge Exam:  Vitals:   01/09/20 0936 01/09/20 1317  BP: (!) 154/88 (!) 145/71  Pulse: 67 75  Resp: 18 18  Temp:  98.5 F (36.9 C)  SpO2: 94% 95%    General: Obese WF who is alert and generally healthy appearing.  Lungs: Clear to auscultation and symmetric breath sounds. Heart:  RRR. No murmur or rub. Abdomen: Soft. No mass. Has bowel sounds.  Incisions look good.  Discharge Medications:   Allergies as of 01/09/2020      Reactions   Bactrim Itching, Nausea And Vomiting, Other (See Comments)   Redness      Medication List    TAKE these medications   ALPRAZolam 1 MG  tablet Commonly known as: XANAX Take 1 tablet (1 mg total) by mouth 3 (three) times daily as needed for anxiety.   ARIPiprazole 10 MG tablet Commonly known as: ABILIFY Take 1 tablet (10 mg total) by mouth at bedtime.   ascorbic acid 500 MG tablet Commonly known as: VITAMIN C Take 500 mg by mouth daily.   B-D ULTRAFINE III SHORT PEN 31G X 8 MM Misc Generic drug: Insulin Pen Needle 1 each by Does not apply route as directed.   cycloSPORINE 0.05 % ophthalmic emulsion Commonly known as: RESTASIS Place 2 drops into both eyes 2 (two) times daily.   escitalopram 20 MG tablet Commonly known as: Lexapro Take 1 tablet (20 mg total) by mouth daily.   exenatide 5 MCG/0.02ML Sopn injection Commonly known as: BYETTA Inject 5 mcg into the skin 2 (two) times daily with a meal.   fluticasone 50 MCG/ACT nasal spray Commonly known as: FLONASE Place 2 sprays into the nose daily as needed for allergies.   FreeStyle Libre 2 Reader Kerrin Mo As directed   [employer] 2 Sensor Misc 1 Piece by Does not apply route every 14 (fourteen) days.   FreeStyle Lite Devi Use to measure glucose 4 times a day   gabapentin 300 MG capsule Commonly known as: NEURONTIN Take 300 mg by mouth 2 (two) times daily.   glucose blood test strip Test 4 times a day, she has freestyle lite meter   ICY HOT EX Apply 1 application topically 4 (four) times daily as needed (joint pain.).   Lantus SoloStar 100 UNIT/ML Solostar Pen Generic drug: insulin glargine Inject 80 Units into the skin at bedtime. Notes to patient: Monitor Blood Sugar Frequently and keep a log for primary care physician, you may need to adjust medication dosage with rapid weight loss.     lisinopril 5 MG tablet Commonly known as: ZESTRIL Take 5 mg by mouth daily. Notes to patient: Monitor Blood Pressure Daily and keep a log for primary care physician.  You may need to make changes to your medications with rapid weight loss.     loratadine 10  MG tablet Commonly known as: CLARITIN Take 10 mg by mouth daily.   meclizine 25 MG tablet Commonly known as: ANTIVERT Take 25 mg by mouth 2 (two) times daily as needed for dizziness.   metFORMIN 1000 MG tablet Commonly known as: GLUCOPHAGE Take 1,000 mg by mouth 2 (two) times daily. Notes to patient: Monitor Blood Sugar Frequently and keep a log for primary care physician, you may need to adjust medication dosage with rapid weight loss.     methocarbamol 500 MG  tablet Commonly known as: ROBAXIN Take 500 mg by mouth 3 (three) times daily as needed for muscle spasms.   multivitamin with minerals Tabs tablet Take 1 tablet by mouth daily.   ondansetron 4 MG disintegrating tablet Commonly known as: Zofran ODT Take 1 tablet (4 mg total) by mouth every 8 (eight) hours as needed for nausea or vomiting.   pantoprazole 40 MG tablet Commonly known as: PROTONIX Take 1 tablet (40 mg total) by mouth 2 (two) times daily. What changed: when to take this   Probiotic Chew Chew 1-2 capsules by mouth daily. *Hold for diarrhea* (Gummy)   rizatriptan 10 MG disintegrating tablet Commonly known as: MAXALT-MLT Take 10 mg by mouth as needed for migraine. May repeat in 2 hours if needed   Soothe 0.6-0.6 % Soln Generic drug: Propylene Glycol-Glycerin Place 1 drop into both eyes 2 (two) times daily.   temazepam 30 MG capsule Commonly known as: Restoril Take 1 capsule (30 mg total) by mouth at bedtime as needed for sleep.   traMADol 50 MG tablet Commonly known as: ULTRAM Take 1 tablet (50 mg total) by mouth every 6 (six) hours as needed (pain).       Disposition: Discharge disposition: 01-Home or Self Care       Discharge Instructions    Ambulate hourly while awake   Complete by: As directed    Call MD for:  difficulty breathing, headache or visual disturbances   Complete by: As directed    Call MD for:  persistant dizziness or light-headedness   Complete by: As directed    Call MD  for:  persistant nausea and vomiting   Complete by: As directed    Call MD for:  redness, tenderness, or signs of infection (pain, swelling, redness, odor or green/yellow discharge around incision site)   Complete by: As directed    Call MD for:  severe uncontrolled pain   Complete by: As directed    Call MD for:  temperature >101 F   Complete by: As directed    Diet bariatric full liquid   Complete by: As directed    Incentive spirometry   Complete by: As directed    Perform hourly while awake       Follow-up Information    Alphonsa Overall, MD. Go on 02/06/2020.   Specialty: General Surgery Why: at 915 am Contact information: [address] [place] [address] [phone]        Carlena Hurl, PA-C. Go on 03/07/2020.   Specialty: General Surgery Why: at 830 am Contact information: [place] [address] [phone]                Signed: Alphonsa Overall, M.D., [facility] Surgery Office:  [phone]  01/09/2020, 3:10 PM

## 2020-01-17 ENCOUNTER — Other Ambulatory Visit: Payer: Self-pay

## 2020-01-17 ENCOUNTER — Encounter: Payer: Self-pay | Admitting: "Endocrinology

## 2020-01-17 ENCOUNTER — Ambulatory Visit (INDEPENDENT_AMBULATORY_CARE_PROVIDER_SITE_OTHER): Payer: Medicare Other | Admitting: "Endocrinology

## 2020-01-17 VITALS — BP 130/86 | HR 76 | Ht 66.0 in | Wt 290.0 lb

## 2020-01-17 DIAGNOSIS — E1165 Type 2 diabetes mellitus with hyperglycemia: Secondary | ICD-10-CM

## 2020-01-17 DIAGNOSIS — I10 Essential (primary) hypertension: Secondary | ICD-10-CM

## 2020-01-17 DIAGNOSIS — E782 Mixed hyperlipidemia: Secondary | ICD-10-CM

## 2020-01-17 MED ORDER — LANTUS SOLOSTAR 100 UNIT/ML ~~LOC~~ SOPN: 20.0000 [IU] | PEN_INJECTOR | Freq: Every day | SUBCUTANEOUS | 1 refills | Status: DC

## 2020-01-17 NOTE — Patient Instructions (Signed)

## 2020-01-17 NOTE — Progress Notes (Signed)
01/17/2020, 5:53 PM                                     Endocrinology follow-up note   Subjective:    Patient ID: [name], female    DOB: 1960/08/17.  [name] is being seen in follow up after she was seen in consultation for management of currently uncontrolled symptomatic diabetes requested by  Sharilyn Sites, MD.   Past Medical History:  Diagnosis Date  . Adenomatous polyp 2009  . Anemia 2003 after surgery needed 1 unit blood  . Anxiety   . Arthritis   . Bipolar 1 disorder ([place])   . Constipation   . Depression   . Diabetes mellitus   . Diabetes mellitus, type II ([place])   . Diverticula of colon 2009  . Generalized headaches   . GERD (gastroesophageal reflux disease)   . History of hiatal hernia    small  . Hypertension   . Migraine   . Neuropathy    both feet  . Obesity   . Pelvic floor dysfunction    abnormal anorectal [facility] at [facility] in 2009  . Plantar fasciitis both feet  . Sleep apnea    cpap setting of 3.5    Past Surgical History:  Procedure Laterality Date  . ABDOMINAL HYSTERECTOMY     compete  . CATARACT EXTRACTION W/PHACO Right 02/15/2017   Procedure: CATARACT EXTRACTION PHACO AND INTRAOCULAR LENS PLACEMENT (IOC);  Surgeon: Rutherford Guys, MD;  Location: [facility];  Service: Ophthalmology;  Laterality: Right;  CDE: 4.18  . CATARACT EXTRACTION W/PHACO Left 03/01/2017   Procedure: CATARACT EXTRACTION PHACO AND INTRAOCULAR LENS PLACEMENT (IOC);  Surgeon: Rutherford Guys, MD;  Location: [facility];  Service: Ophthalmology;  Laterality: Left;  CDE: 2.56  . COLON RESECTION    03/30/2002   with end-colostomy and Hartmann's pouch  . COLON SURGERY     complicated diverticulitis requiring sigmoid resection with colostomy and subsequent takedown  . COLONOSCOPY  12/11/2007   Dr. Gala Romney- marginal prep, normal rectum pancolonic diverticula, adenomatous polyp  . COLONOSCOPY  08/28/2003      Wide open colonic anastomosis/Scattered  diverticula noted throughout colon/ Small external hemorrhoids  . COLONOSCOPY  04/2012   [facility]: hyperplastic polyps, diverticulosis, ileocolonic anastomosis.  . COLONOSCOPY WITH PROPOFOL N/A 06/07/2016   Procedure: COLONOSCOPY WITH PROPOFOL;  Surgeon: Daneil Dolin, MD;  Location: [facility];  Service: Endoscopy;  Laterality: N/A;  7:30 am  . COLOSTOMY CLOSURE  07/10/2002  . ESOPHAGOGASTRODUODENOSCOPY N/A 01/08/2020   Procedure: UPPER GASTROINTESTINAL ENDOSCOPY;  Surgeon: Alphonsa Overall, MD;  Location: [facility];  Service: General;  Laterality: N/A;  . ESOPHAGOGASTRODUODENOSCOPY (EGD) WITH PROPOFOL N/A 08/23/2016   Procedure: ESOPHAGOGASTRODUODENOSCOPY (EGD) WITH PROPOFOL;  Surgeon: Alphonsa Overall, MD;  Location: [facility];  Service: General;  Laterality: N/A;  . FLEXIBLE SIGMOIDOSCOPY  01/20/2012   RMR: incomplete/attempted colonoscopy. Inadequate prep precluded examination  . HEEL SPUR SURGERY Left 09/11/2013   both have been done  . KNEE ARTHROSCOPY  02/04/2004    left knee/partial medial meniscectomy.  Marland Kitchen KNEE SURGERY     3 arthroscopic  2 on left 1 on right  . LAPAROSCOPIC GASTRIC BANDING  2009  . LAPAROSCOPIC GASTRIC SLEEVE RESECTION N/A 01/08/2020   Procedure: LAPAROSCOPIC SLEEVE GASTRECTOMY;  Surgeon: Alphonsa Overall, MD;  Location: [facility];  Service: General;  Laterality: N/A;  .  LAPAROSCOPIC SALPINGOOPHERECTOMY  03/30/2002  . TUBAL LIGATION      Social History   Socioeconomic History  . Marital status: Married    Spouse name: Not on file  . Number of children: 2  . Years of education: college  . Highest education level: Not on file  Occupational History  . 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 Smoking status: Never Smoker  . Smokeless tobacco: Never Used  Vaping Use  . Vaping Use: Never used  Substance and Sexual Activity  . Alcohol use: Not Currently    Comment: occasionally wine  . Drug use: No  . Sexual activity:  Not Currently    Birth control/protection: Surgical  Other Topics Concern  . Not on file  Social History Narrative  . Not on file   Social Determinants of Health   Financial Resource Strain:   . Difficulty of Paying Living Expenses: Not on file  Food Insecurity:   . Worried About Charity fundraiser in the Last Year: Not on file  . Ran Out of Food in the Last Year: Not on file  Transportation Needs:   . Lack of Transportation (Medical): Not on file  . Lack of Transportation (Non-Medical): Not on file  Physical Activity:   . Days of Exercise per Week: Not on file  . Minutes of Exercise per Session: Not on file  Stress:   . Feeling of Stress : Not on file  Social Connections:   . Frequency of Communication with Friends and Family: Not on file  . Frequency of Social Gatherings with Friends and Family: Not on file  . Attends Religious Services: Not on file  . Active Member of Clubs or Organizations: Not on file  . Attends Archivist Meetings: Not on file  . Marital Status: Not on file    Family History  Problem Relation Age of Onset  . Ovarian cancer Mother   . Heart disease Mother   . Anxiety disorder Mother   . Cirrhosis Father        deceased age 76  . Alcohol abuse Father   . Liver cancer Cousin        age 29, deceased  . Drug abuse Cousin   . ADD / ADHD Son   . Anxiety disorder Sister   . Dementia Maternal Grandfather   . Colon cancer Other        aunt, deceased age 23  . Breast cancer Other        aunt, deceased age 62  . Bipolar disorder Neg Hx   . Depression Neg Hx   . OCD Neg Hx   . Paranoid behavior Neg Hx   . Schizophrenia Neg Hx   . Seizures Neg Hx   . Sexual abuse Neg Hx   . Physical abuse Neg Hx     Outpatient Encounter Medications as of 01/17/2020  Medication Sig  . ALPRAZolam (XANAX) 1 MG tablet Take 1 tablet (1 mg total) by mouth 3 (three) times daily as needed for anxiety.  . ARIPiprazole (ABILIFY) 10 MG tablet Take 1 tablet (10 mg  total) by mouth at bedtime.  Marland Kitchen ascorbic acid (VITAMIN C) 500 MG tablet Take 500 mg by mouth daily.  . Blood Glucose Monitoring Suppl (FREESTYLE LITE) DEVI Use to measure glucose 4 times a day  . Continuous Blood Gluc Receiver (FREESTYLE LIBRE 2 READER) DEVI As directed  . Continuous Blood  Gluc Sensor (FREESTYLE LIBRE 2 SENSOR) MISC 1 Piece by Does not apply route every 14 (fourteen) days.  . cycloSPORINE (RESTASIS) 0.05 % ophthalmic emulsion Place 2 drops into both eyes 2 (two) times daily.   Marland Kitchen escitalopram (LEXAPRO) 20 MG tablet Take 1 tablet (20 mg total) by mouth daily.  . fluticasone (FLONASE) 50 MCG/ACT nasal spray Place 2 sprays into the nose daily as needed for allergies.   Marland Kitchen gabapentin (NEURONTIN) 300 MG capsule Take 300 mg by mouth 2 (two) times daily.  Marland Kitchen glucose blood test strip Test 4 times a day, she has freestyle lite meter  . insulin glargine (LANTUS SOLOSTAR) 100 UNIT/ML Solostar Pen Inject 20 Units into the skin at bedtime.  . Insulin Pen Needle (B-D ULTRAFINE III SHORT PEN) 31G X 8 MM MISC 1 each by Does not apply route as directed.  Marland Kitchen lisinopril (ZESTRIL) 5 MG tablet Take 5 mg by mouth daily.  Marland Kitchen loratadine (CLARITIN) 10 MG tablet Take 10 mg by mouth daily.  . meclizine (ANTIVERT) 25 MG tablet Take 25 mg by mouth 2 (two) times daily as needed for dizziness.   . Menthol, Topical Analgesic, (ICY HOT EX) Apply 1 application topically 4 (four) times daily as needed (joint pain.).  Marland Kitchen methocarbamol (ROBAXIN) 500 MG tablet Take 500 mg by mouth 3 (three) times daily as needed for muscle spasms.   . Multiple Vitamin (MULTIVITAMIN WITH MINERALS) TABS tablet Take 1 tablet by mouth daily.  . ondansetron (ZOFRAN ODT) 4 MG disintegrating tablet Take 1 tablet (4 mg total) by mouth every 8 (eight) hours as needed for nausea or vomiting.  . pantoprazole (PROTONIX) 40 MG tablet Take 1 tablet (40 mg total) by mouth 2 (two) times daily. (Patient taking differently: Take 40 mg by mouth daily before  breakfast. )  . Probiotic CHEW Chew 1-2 capsules by mouth daily. *Hold for diarrhea* (Gummy)  . Propylene Glycol-Glycerin (SOOTHE) 0.6-0.6 % SOLN Place 1 drop into both eyes 2 (two) times daily.  . rizatriptan (MAXALT-MLT) 10 MG disintegrating tablet Take 10 mg by mouth as needed for migraine. May repeat in 2 hours if needed  . temazepam (RESTORIL) 30 MG capsule Take 1 capsule (30 mg total) by mouth at bedtime as needed for sleep.  . traMADol (ULTRAM) 50 MG tablet Take 1 tablet (50 mg total) by mouth every 6 (six) hours as needed (pain).  . [DISCONTINUED] exenatide (BYETTA) 5 MCG/0.02ML SOPN injection Inject 5 mcg into the skin 2 (two) times daily with a meal.  . [DISCONTINUED] insulin glargine (LANTUS SOLOSTAR) 100 UNIT/ML Solostar Pen Inject 80 Units into the skin at bedtime.  . [DISCONTINUED] metFORMIN (GLUCOPHAGE) 1000 MG tablet Take 1,000 mg by mouth 2 (two) times daily.   No facility-administered encounter medications on file as of 01/17/2020.    ALLERGIES: Allergies  Allergen Reactions  . Bactrim Itching, Nausea And Vomiting and Other (See Comments)    Redness    VACCINATION STATUS: Immunization History  Administered Date(s) Administered  . Moderna SARS-COVID-2 Vaccination 07/21/2019, 08/22/2019  . Pneumococcal Polysaccharide-23 01/09/2020    Diabetes She presents for her follow-up diabetic visit. She has type 2 diabetes mellitus. Onset time: She was diagnosed at approximate age of 83 years. Her disease course has been improving (She underwent sleeve gastrectomy on January 08, 2020, and she has lost 14 pounds since her surgery.). There are no hypoglycemic associated symptoms. Pertinent negatives for hypoglycemia include no confusion, headaches, pallor or seizures. Associated symptoms include fatigue. Pertinent negatives for diabetes  include no chest pain, no polydipsia, no polyphagia and no polyuria. There are no hypoglycemic complications. Symptoms are improving. Diabetic  complications include heart disease. Risk factors for coronary artery disease include dyslipidemia, family history, diabetes mellitus, hypertension, obesity, sedentary lifestyle and post-menopausal. Current diabetic treatments: She is currently on only Lantus 20 units nightly. Her weight is decreasing steadily. She is following a generally unhealthy diet. When asked about meal planning, she reported none. She has not had a previous visit with a dietitian. She never participates in exercise. Her home blood glucose trend is fluctuating minimally. Her breakfast blood glucose range is generally 130-140 mg/dl. Her lunch blood glucose range is generally 130-140 mg/dl. Her bedtime blood glucose range is generally 130-140 mg/dl. (  ) An ACE inhibitor/angiotensin II receptor blocker is being taken. Eye exam is current.  Hypertension This is a chronic problem. The current episode started more than 1 year ago. The problem is uncontrolled. Pertinent negatives include no chest pain, headaches, palpitations or shortness of breath. Risk factors for coronary artery disease include diabetes mellitus, obesity, sedentary lifestyle and post-menopausal state. Past treatments include ACE inhibitors.    Review of systems: Limited as above. Objective:    BP 130/86   Pulse 76   Ht $R'5\' 6"'ZE$  (1.676 m)   Wt 290 lb (131.5 kg)   BMI 46.81 kg/m   Wt Readings from Last 3 Encounters:  01/17/20 290 lb (131.5 kg)  01/08/20 (!) 303 lb (137.4 kg)  01/01/20 (!) 303 lb (137.4 kg)     Physical Exam- Limited  Constitutional:  Body mass index is 46.81 kg/m. , not in acute distress, normal state of mind Eyes:  EOMI, no exophthalmos Neck: Supple Thyroid: No gross goiter Respiratory: Adequate breathing efforts Musculoskeletal: no gross deformities, strength intact in all four extremities, no gross restriction of joint movements Skin:  no rashes, no hyperemia Neurological: no tremor with outstretched hands,    CMP     Component  Value Date/Time   NA 140 01/01/2020 1356   NA 136 12/06/2019 0808   K 4.5 01/01/2020 1356   CL 103 01/01/2020 1356   CO2 28 01/01/2020 1356   GLUCOSE 116 (H) 01/01/2020 1356   BUN 19 01/01/2020 1356   BUN 15 12/06/2019 0808   CREATININE 0.67 01/01/2020 1356   CREATININE 0.74 03/23/2011 0400   CALCIUM 9.1 01/01/2020 1356   PROT 7.7 01/01/2020 1356   PROT 7.4 12/06/2019 0808   ALBUMIN 4.1 01/01/2020 1356   ALBUMIN 4.5 12/06/2019 0808   AST 52 (H) 01/01/2020 1356   ALT 51 (H) 01/01/2020 1356   ALKPHOS 78 01/01/2020 1356   BILITOT 0.6 01/01/2020 1356   BILITOT 0.4 12/06/2019 0808   GFRNONAA >60 01/01/2020 1356   GFRAA >60 01/01/2020 1356   Recent Results (from the past 2160 hour(s))  Microalbumin, urine     Status: None   Collection Time: 12/06/19 12:00 AM  Result Value Ref Range   Microalb, Ur 30.8   Basic metabolic panel     Status: Abnormal   Collection Time: 12/06/19 12:00 AM  Result Value Ref Range   BUN 15 4 - 21   CO2 25 (A) 13 - 22   Creatinine 0.7 0.5 - 1.1   Potassium 5.0 3.4 - 5.3   Sodium 136 (A) 137 - 147   Chloride 96 (A) 99 - 108  Comprehensive metabolic panel     Status: None   Collection Time: 12/06/19 12:00 AM  Result Value Ref Range  GFR calc Af Amer 111    GFR calc non Af Amer 96    Calcium 9.5 8.7 - 10.7  Lipid panel     Status: None   Collection Time: 12/06/19 12:00 AM  Result Value Ref Range   Triglycerides 144 40 - 160   Cholesterol 196 0 - 200   HDL 52 35 - 70   LDL Cholesterol 25   TSH     Status: None   Collection Time: 12/06/19 12:00 AM  Result Value Ref Range   TSH 1.56 0.41 - 5.90    Comment: free t4- 1.41  Comprehensive metabolic panel     Status: Abnormal   Collection Time: 12/06/19  8:08 AM  Result Value Ref Range   Glucose 319 (H) 65 - 99 mg/dL   BUN 15 8 - 27 mg/dL   Creatinine, Ser 0.66 0.57 - 1.00 mg/dL   GFR calc non Af Amer 96 >59 mL/min/1.73   GFR calc Af Amer 111 >59 mL/min/1.73    Comment: **[facility] currently  reports eGFR in compliance with the current**   recommendations of the [employer]. [facility] will   update reporting as new guidelines are published from the NKF-ASN   Task force.    BUN/Creatinine Ratio 23 12 - 28   Sodium 136 134 - 144 mmol/L   Potassium 5.0 3.5 - 5.2 mmol/L   Chloride 96 96 - 106 mmol/L   CO2 25 20 - 29 mmol/L   Calcium 9.5 8.7 - 10.3 mg/dL   Total Protein 7.4 6.0 - 8.5 g/dL   Albumin 4.5 3.8 - 4.9 g/dL   Globulin, Total 2.9 1.5 - 4.5 g/dL   Albumin/Globulin Ratio 1.6 1.2 - 2.2   Bilirubin Total 0.4 0.0 - 1.2 mg/dL   Alkaline Phosphatase 132 (H) 48 - 121 IU/L   AST 48 (H) 0 - 40 IU/L   ALT 42 (H) 0 - 32 IU/L  Lipid Panel w/o Chol/HDL Ratio     Status: Abnormal   Collection Time: 12/06/19  8:08 AM  Result Value Ref Range   Cholesterol, Total 196 100 - 199 mg/dL   Triglycerides 144 0 - 149 mg/dL   HDL 52 >39 mg/dL   VLDL Cholesterol Cal 25 5 - 40 mg/dL   LDL Chol Calc (NIH) 119 (H) 0 - 99 mg/dL  T4, free     Status: None   Collection Time: 12/06/19  8:08 AM  Result Value Ref Range   Free T4 1.41 0.82 - 1.77 ng/dL  TSH     Status: None   Collection Time: 12/06/19  8:08 AM  Result Value Ref Range   TSH 1.560 0.450 - 4.500 uIU/mL  Microalbumin, urine     Status: None   Collection Time: 12/06/19  8:08 AM  Result Value Ref Range   Microalbumin, Urine 20.0 Not Estab. ug/mL  Specimen status report     Status: None   Collection Time: 12/06/19  8:08 AM  Result Value Ref Range   specimen status report Comment     Comment: [employer] CMP14 Default Ambig Abbrev CMP14 Default A hand-written panel/profile was received from your office. In accordance with the [facility] Ambiguous Test Code Policy dated July 4098, we have completed your order by using the closest currently or formerly recognized AMA panel.  We have assigned Comprehensive Metabolic Panel (14), Test Code #[id number] to this request.  If this is not the testing you wished to receive on this  specimen, please contact  the [employer] Department to clarify the test order.  We appreciate your business. Ambig Abbrev LP Default Ambig Abbrev LP Default A hand-written panel/profile was received from your office. In accordance with the [facility] Ambiguous Test Code Policy dated July 9476, we have completed your order by using the closest currently or formerly recognized AMA panel.  We have assigned Lipid Panel, Test Code [phone] to this request. If this is not the testing you wished to receive on this specimen, plea se contact the [facility] Client Inquiry/Technical Services Department to clarify the test order.  We appreciate your business.   HgB A1c     Status: Abnormal   Collection Time: 12/12/19 10:32 AM  Result Value Ref Range   Hemoglobin A1C 9.9 (A) 4.0 - 5.6 %   HbA1c POC (<> result, manual entry)     HbA1c, POC (prediabetic range)     HbA1c, POC (controlled diabetic range)    Glucose, capillary     Status: Abnormal   Collection Time: 01/01/20  1:12 PM  Result Value Ref Range   Glucose-Capillary 122 (H) 70 - 99 mg/dL    Comment: Glucose reference range applies only to samples taken after fasting for at least 8 hours.  CBC WITH DIFFERENTIAL     Status: Abnormal   Collection Time: 01/01/20  1:56 PM  Result Value Ref Range   WBC 10.9 (H) 4.0 - 10.5 K/uL   RBC 4.88 3.87 - 5.11 MIL/uL   Hemoglobin 14.1 12.0 - 15.0 g/dL   HCT 43.9 36 - 46 %   MCV 90.0 80.0 - 100.0 fL   MCH 28.9 26.0 - 34.0 pg   MCHC 32.1 30.0 - 36.0 g/dL   RDW 14.3 11.5 - 15.5 %   Platelets 240 150 - 400 K/uL   nRBC 0.0 0.0 - 0.2 %   Neutrophils Relative % 64 %   Neutro Abs 7.0 1.7 - 7.7 K/uL   Lymphocytes Relative 24 %   Lymphs Abs 2.6 0.7 - 4.0 K/uL   Monocytes Relative 7 %   Monocytes Absolute 0.7 0 - 1 K/uL   Eosinophils Relative 3 %   Eosinophils Absolute 0.3 0 - 0 K/uL   Basophils Relative 1 %   Basophils Absolute 0.1 0 - 0 K/uL   Immature Granulocytes 1 %   Abs  Immature Granulocytes 0.10 (H) 0.00 - 0.07 K/uL    Comment: Performed at [facility] [place] [address] [place], [place] [address]  Comprehensive metabolic panel     Status: Abnormal   Collection Time: 01/01/20  1:56 PM  Result Value Ref Range   Sodium 140 135 - 145 mmol/L   Potassium 4.5 3.5 - 5.1 mmol/L   Chloride 103 98 - 111 mmol/L   CO2 28 22 - 32 mmol/L   Glucose, Bld 116 (H) 70 - 99 mg/dL    Comment: Glucose reference range applies only to samples taken after fasting for at least 8 hours.   BUN 19 6 - 20 mg/dL   Creatinine, Ser 0.67 0.44 - 1.00 mg/dL   Calcium 9.1 8.9 - 10.3 mg/dL   Total Protein 7.7 6.5 - 8.1 g/dL   Albumin 4.1 3.5 - 5.0 g/dL   AST 52 (H) 15 - 41 U/L   ALT 51 (H) 0 - 44 U/L   Alkaline Phosphatase 78 38 - 126 U/L   Total Bilirubin 0.6 0.3 - 1.2 mg/dL   GFR calc non Af Amer >60 >60 mL/min  GFR calc Af Amer >60 >60 mL/min   Anion gap 9 5 - 15    Comment: Performed at [facility], [place] [address] [place], [place] [address]  Type and screen     Status: None   Collection Time: 01/01/20  1:56 PM  Result Value Ref Range   ABO/RH(D) O POS    Antibody Screen NEG    Sample Expiration 01/11/2020,2359    Extend sample reason      NO TRANSFUSIONS OR PREGNANCY IN THE PAST 3 MONTHS Performed at [facility], [place] [address], [place], [place] [address]   Hemoglobin A1c     Status: Abnormal   Collection Time: 01/01/20  1:56 PM  Result Value Ref Range   Hgb A1c MFr Bld 9.7 (H) 4.8 - 5.6 %    Comment: (NOTE)         Prediabetes: 5.7 - 6.4         Diabetes: >6.4         Glycemic control for adults with diabetes: <7.0    Mean Plasma Glucose 232 mg/dL    Comment: (NOTE) Performed At: [facility] [place], [place] [id number] Rush Farmer MD [id number]   SARS CORONAVIRUS 2 (TAT 6-24 HRS) Nasopharyngeal Nasopharyngeal Swab     Status: None   Collection Time: 01/04/20 10:40 AM    Specimen: Nasopharyngeal Swab  Result Value Ref Range   SARS Coronavirus 2 NEGATIVE NEGATIVE    Comment: (NOTE) SARS-CoV-2 target nucleic acids are NOT DETECTED.  The SARS-CoV-2 RNA is generally detectable in upper and lower respiratory specimens during the acute phase of infection. Negative results do not preclude SARS-CoV-2 infection, do not rule out co-infections with other pathogens, and should not be used as the sole basis for treatment or other patient management decisions. Negative results must be combined with clinical observations, patient history, and epidemiological information. The expected result is Negative.  Fact Sheet for Patients: SugarRoll.be  Fact Sheet for Healthcare Providers: https://www.woods-mathews.com/  This test is not yet approved or cleared by the [place] FDA and  has been authorized for detection and/or diagnosis of SARS-CoV-2 by FDA under an Emergency Use Authorization (EUA). This EUA will remain  in effect (meaning this test can be used) for the duration of the COVID-19 declaration under Se ction 564(b)(1) of the Act, 21 U.S.C. section 360bbb-3(b)(1), unless the authorization is terminated or revoked sooner.  Performed at [facility], [place] [address] [place], [place] [address]   Glucose, capillary     Status: Abnormal   Collection Time: 01/08/20  5:52 AM  Result Value Ref Range   Glucose-Capillary 177 (H) 70 - 99 mg/dL    Comment: Glucose reference range applies only to samples taken after fasting for at least 8 hours.  ABO/Rh     Status: None   Collection Time: 01/08/20  6:35 AM  Result Value Ref Range   ABO/RH(D)      O POS Performed at [facility], [place] [address] [place], [place] [address]   Surgical pathology     Status: None   Collection Time: 01/08/20  7:55 AM  Result Value Ref Range   SURGICAL PATHOLOGY      SURGICAL PATHOLOGY CASE: [id number] PATIENT:  [name] Surgical Pathology Report     Clinical History: Morbid obesity (crm)     FINAL MICROSCOPIC DIAGNOSIS:  A. STOMACH, GASTRIC SLEEVE RESECTION, GROSS DIAGNOSIS ONLY: - Portion of unremarkable stomach.  GROSS DESCRIPTION:  Received fresh is a 32 cm in length x 4 cm in diameter portion of stomach, received disrupted at one end. The serosa is smooth, tan-pink. The wall averages 0.2 cm. The mucosa is tan-pink, with normal rugal folding.  Discrete lesions are not grossly identified.  Gross only.  (AK 01/08/2020)    Final Diagnosis performed by Gillie Manners, MD.   Electronically signed 01/09/2020 Technical component performed at [facility], [place] [address] [place], [place] [address].  Professional component performed at [employer]. [facility], [place] [address] [place], [place] [address].  Immunohistochemistry Technical component (if applicable) was performed at [facility]. [address] [place], [place], [place] [address].   IMMUNOHISTOCHEMISTRY DISCLAIMER (if applicable): Some of these immunohistochemical stains may have been developed and the performance characteristics determine by [facility]. Some may not have been cleared or approved by the U.S. Food and Drug Administration. The FDA has determined that such clearance or approval is not necessary. This test is used for clinical purposes. It should not be regarded as investigational or for research. This laboratory is certified under the [place] (CLIA-88) as qualified to perform high complexity clinical laboratory testing.  The controls stained appropriately.   Glucose, capillary     Status: Abnormal   Collection Time: 01/08/20 10:11 AM  Result Value Ref Range   Glucose-Capillary 219 (H) 70 - 99 mg/dL    Comment: Glucose reference range applies only to samples taken after fasting for at least 8  hours.  Glucose, capillary     Status: Abnormal   Collection Time: 01/08/20 11:56 AM  Result Value Ref Range   Glucose-Capillary 260 (H) 70 - 99 mg/dL    Comment: Glucose reference range applies only to samples taken after fasting for at least 8 hours.  Hemoglobin and hematocrit, blood     Status: None   Collection Time: 01/08/20  2:30 PM  Result Value Ref Range   Hemoglobin 13.9 12.0 - 15.0 g/dL   HCT 42.4 36 - 46 %    Comment: Performed at [facility], [place] [address] [place], [place] [address]  Glucose, capillary     Status: Abnormal   Collection Time: 01/08/20  3:47 PM  Result Value Ref Range   Glucose-Capillary 343 (H) 70 - 99 mg/dL    Comment: Glucose reference range applies only to samples taken after fasting for at least 8 hours.  Glucose, capillary     Status: Abnormal   Collection Time: 01/08/20  8:00 PM  Result Value Ref Range   Glucose-Capillary 234 (H) 70 - 99 mg/dL    Comment: Glucose reference range applies only to samples taken after fasting for at least 8 hours.  Glucose, capillary     Status: Abnormal   Collection Time: 01/08/20 11:04 PM  Result Value Ref Range   Glucose-Capillary 199 (H) 70 - 99 mg/dL    Comment: Glucose reference range applies only to samples taken after fasting for at least 8 hours.  Glucose, capillary     Status: Abnormal   Collection Time: 01/09/20  3:50 AM  Result Value Ref Range   Glucose-Capillary 153 (H) 70 - 99 mg/dL    Comment: Glucose reference range applies only to samples taken after fasting for at least 8 hours.  CBC WITH DIFFERENTIAL     Status: Abnormal   Collection Time: 01/09/20  5:08 AM  Result Value Ref Range  WBC 11.0 (H) 4.0 - 10.5 K/uL   RBC 4.49 3.87 - 5.11 MIL/uL   Hemoglobin 12.8 12.0 - 15.0 g/dL   HCT 41.0 36 - 46 %   MCV 91.3 80.0 - 100.0 fL   MCH 28.5 26.0 - 34.0 pg   MCHC 31.2 30.0 - 36.0 g/dL   RDW 14.2 11.5 - 15.5 %   Platelets 215 150 - 400 K/uL   nRBC 0.0 0.0 - 0.2 %   Neutrophils  Relative % 69 %   Neutro Abs 7.7 1.7 - 7.7 K/uL   Lymphocytes Relative 21 %   Lymphs Abs 2.3 0.7 - 4.0 K/uL   Monocytes Relative 8 %   Monocytes Absolute 0.9 0 - 1 K/uL   Eosinophils Relative 1 %   Eosinophils Absolute 0.1 0 - 0 K/uL   Basophils Relative 0 %   Basophils Absolute 0.0 0 - 0 K/uL   Immature Granulocytes 1 %   Abs Immature Granulocytes 0.10 (H) 0.00 - 0.07 K/uL    Comment: Performed at [facility], [place] [address] [place], [place] [address]  Glucose, capillary     Status: Abnormal   Collection Time: 01/09/20  7:32 AM  Result Value Ref Range   Glucose-Capillary 154 (H) 70 - 99 mg/dL    Comment: Glucose reference range applies only to samples taken after fasting for at least 8 hours.  Glucose, capillary     Status: Abnormal   Collection Time: 01/09/20 11:24 AM  Result Value Ref Range   Glucose-Capillary 138 (H) 70 - 99 mg/dL    Comment: Glucose reference range applies only to samples taken after fasting for at least 8 hours.     Assessment & Plan:   1. Uncontrolled type 2 diabetes mellitus with hyperglycemia ([place])  - [name] has currently uncontrolled symptomatic type 2 DM since  59 years of age.  She is status post sleeve gastrectomy on August 31 presen lost 14 pounds already.  Her glycemic profile is near target using only 20 units of Lantus.    She did not document hypoglycemia.    - Recent labs reviewed. - I had a long discussion with her about the progressive nature of diabetes and the pathology behind its complications. -her diabetes is complicated by morbid obesity, sedentary life, hypertension and she remains at a high risk for more acute and chronic complications which include CAD, CVA, CKD, retinopathy, and neuropathy. These are all discussed in detail with her. -She is currently being prepared for sleeve gastrectomy at the end of this month.  - I have counseled her on diet  and weight management  by adopting a carbohydrate  restricted/protein rich diet. Patient is encouraged to switch to  unprocessed or minimally processed     complex starch and increased protein intake (animal or plant source), fruits, and vegetables. -  she is advised to stick to a routine mealtimes to eat 3 meals  a day and avoid unnecessary snacks ( to snack only to correct hypoglycemia).  - she  admits there is a room for improvement in her diet and drink choices. -  Suggestion is made for her to avoid simple carbohydrates  from her diet including Cakes, Sweet Desserts / Pastries, Ice Cream, Soda (diet and regular), Sweet Tea, Candies, Chips, Cookies, Sweet Pastries,  Store Bought Juices, Alcohol in Excess of  1-2 drinks a day, Artificial Sweeteners, Coffee Creamer, and "Sugar-free" Products. This will help patient to have stable blood glucose profile  and potentially avoid unintended weight gain.  - I have approached her with the following individualized plan to manage  her diabetes and patient agrees:   -She is responding to the gastrectomy already with near target glycemic profile.  She is advised to continue Lantus 20 units nightly, discontinue Metformin and [place] for now. Eventually, Metformin and incretin therapy would be preferred over Lantus for her diabetes care. She has received the freestyle libre CGM device.  She will be helped today in the office with the first application. - Specific targets for  A1c;  LDL, HDL,  and Triglycerides were discussed with the patient.   2) Blood Pressure /Hypertension: Her blood pressure is controlled to target.   she is advised to continue her current medications including lisinopril 5 mg p.o. daily with breakfast , lisinopril will be increased to  10 mg for next refill.  3) Lipids/Hyperlipidemia:     -Her recent lipid panel showed uncontrolled LDL of 91.  She will be considered for fasting lipid panel before her next visit.  4)  Weight/Diet: Her BMI is 46.8, lost 14 pounds since sleeve gastrectomy.   She is expected to continue to lose  More weight.    5) Chronic Care/Health Maintenance:  -she  is on ACEI/ARB and  is encouraged to initiate and continue to follow up with Ophthalmology, Dentist,  Podiatrist at least yearly or according to recommendations, and advised to  stay away from smoking. I have recommended yearly flu vaccine and pneumonia vaccine at least every 5 years; moderate intensity exercise for up to 150 minutes weekly; and  sleep for at least 7 hours a day.  - she is  advised to maintain close follow up with Sharilyn Sites, MD for primary care needs, as well as her other providers for optimal and coordinated care.  - Time spent on this patient care encounter:  35 min, of which > 50% was spent in  counseling and the rest reviewing her blood glucose logs , discussing her hypoglycemia and hyperglycemia episodes, reviewing her current and  previous labs / studies  ( including abstraction from other facilities) and medications  doses and developing a  long term treatment plan and documenting her care.   Please refer to Patient Instructions for Blood Glucose Monitoring and Insulin/Medications Dosing Guide"  in media tab for additional information. Please  also refer to " Patient Self Inventory" in the Media  tab for reviewed elements of pertinent patient history.  [name] participated in the discussions, expressed understanding, and voiced agreement with the above plans.  All questions were answered to her satisfaction. she is encouraged to contact clinic should she have any questions or concerns prior to her return visit.   Follow up plan: - Return in about 3 months (around 04/17/2020) for NV A1c in Office.  Glade Lloyd, MD [facility] [address] [place], [place] [address] Phone: [phone]  Fax: [phone]    01/17/2020, 5:53 PM  This note was partially dictated with voice recognition software. Similar sounding  words can be transcribed inadequately or may not  be corrected upon review.

## 2020-01-22 ENCOUNTER — Encounter: Payer: Medicare Other | Attending: Physician Assistant | Admitting: Skilled Nursing Facility1

## 2020-01-22 ENCOUNTER — Other Ambulatory Visit: Payer: Self-pay

## 2020-01-22 DIAGNOSIS — E119 Type 2 diabetes mellitus without complications: Secondary | ICD-10-CM | POA: Insufficient documentation

## 2020-01-22 DIAGNOSIS — E669 Obesity, unspecified: Secondary | ICD-10-CM

## 2020-01-24 NOTE — Progress Notes (Signed)
2 Week Post-Operative Nutrition Class   Patient was seen on 07/04/18 for Post-Operative Nutrition education at the [facility].    Surgery date: 01/08/2020 Surgery type: sleeve Start weight at [facility]: 302 Weight today: 287.2   Body Composition Scale   Total Body Fat % 48.8  Visceral Fat 21  Fat-Free Mass % 51.1   Total Body Water % 40   Muscle-Mass lbs 31.5  Body Fat Displacement          Torso  lbs 87.1         Left Leg  lbs 17.4         Right Leg  lbs 17.4         Left Arm  lbs 8.7         Right Arm   lbs 8.7     The following the learning objectives were met by the patient during this course:  Identifies Phase 3 (Soft, High Proteins) Dietary Goals and will begin from 2 weeks post-operatively to 2 months post-operatively  Identifies appropriate sources of fluids and proteins   States protein recommendations and appropriate sources post-operatively  Identifies the need for appropriate texture modifications, mastication, and bite sizes when consuming solids  Identifies appropriate multivitamin and calcium sources post-operatively  Describes the need for physical activity post-operatively and will follow MD recommendations  States when to call healthcare provider regarding medication questions or post-operative complications   Handouts given during class include:  Phase 3A: Soft, High Protein Diet Handout   Follow-Up Plan: Patient will follow-up at [facility] in 6 weeks for 2 month post-op nutrition visit for diet advancement per MD.

## 2020-01-28 ENCOUNTER — Telehealth: Payer: Self-pay | Admitting: Dietician

## 2020-01-28 NOTE — Telephone Encounter (Signed)
I spoke with patient via telephone to assess fluid intake and food tolerance since diet advancement to solid protein foods on 01/22/2020.  Surgery Date: 01/08/2020 Surgery Type: Lap Band to Sleeve  Daily fluid intake: 50 ounces Daily protein intake: 60-65 grams  Patient states she has tried chicken, tofu, [place], cheese, eggs, soy milk, and protein shakes. Doing well with getting adequate protein, getting closer to meeting fluid goal daily. No issues or concerns reported.     Nat Christen [place]) Jamia Hoban, MS, RD, LDN

## 2020-01-31 ENCOUNTER — Ambulatory Visit (INDEPENDENT_AMBULATORY_CARE_PROVIDER_SITE_OTHER): Payer: Medicare Other | Admitting: Orthopaedic Surgery

## 2020-01-31 ENCOUNTER — Encounter: Payer: Self-pay | Admitting: Orthopaedic Surgery

## 2020-01-31 ENCOUNTER — Other Ambulatory Visit: Payer: Self-pay

## 2020-01-31 VITALS — Wt 288.0 lb

## 2020-01-31 DIAGNOSIS — G8929 Other chronic pain: Secondary | ICD-10-CM | POA: Diagnosis not present

## 2020-01-31 DIAGNOSIS — M25561 Pain in right knee: Secondary | ICD-10-CM | POA: Diagnosis not present

## 2020-01-31 DIAGNOSIS — M25562 Pain in left knee: Secondary | ICD-10-CM

## 2020-01-31 DIAGNOSIS — Z6841 Body Mass Index (BMI) 40.0 and over, adult: Secondary | ICD-10-CM

## 2020-01-31 MED ORDER — TRAMADOL HCL 50 MG PO TABS: ORAL_TABLET | ORAL | 3 refills | Status: DC

## 2020-01-31 NOTE — Progress Notes (Signed)
PROCEDURE NOTE:  The patient requests injections of the left knee , verbal consent was obtained.  The left knee was prepped appropriately after time out was performed.   Sterile technique was observed and injection of 1 cc of Depo-Medrol 40 mg with several cc's of plain xylocaine. Anesthesia was provided by ethyl chloride and a 20-gauge needle was used to inject the knee area. The injection was tolerated well.  A band aid dressing was applied.  The patient was advised to apply ice later today and tomorrow to the injection sight as needed.  PROCEDURE NOTE:  The patient requests injections of the right knee , verbal consent was obtained.  The right knee was prepped appropriately after time out was performed.   Sterile technique was observed and injection of 1 cc of Depo-Medrol 40 mg with several cc's of plain xylocaine. Anesthesia was provided by ethyl chloride and a 20-gauge needle was used to inject the knee area. The injection was tolerated well.  A band aid dressing was applied.  The patient was advised to apply ice later today and tomorrow to the injection sight as needed.  I have reviewed the [place] web site prior to prescribing narcotic medicine for this patient.   Call if any problem.  Precautions discussed.   Electronically Signed Sanjuana Kava, MD 9/23/20219:45 AM

## 2020-02-04 ENCOUNTER — Other Ambulatory Visit (HOSPITAL_COMMUNITY): Payer: Self-pay | Admitting: Family Medicine

## 2020-02-04 DIAGNOSIS — Z1231 Encounter for screening mammogram for malignant neoplasm of breast: Secondary | ICD-10-CM

## 2020-02-08 ENCOUNTER — Other Ambulatory Visit: Payer: Self-pay | Admitting: Internal Medicine

## 2020-02-08 ENCOUNTER — Encounter (HOSPITAL_COMMUNITY): Payer: Self-pay

## 2020-02-08 ENCOUNTER — Ambulatory Visit (HOSPITAL_COMMUNITY): Payer: Medicare Other

## 2020-02-08 ENCOUNTER — Other Ambulatory Visit (HOSPITAL_COMMUNITY): Payer: Self-pay | Admitting: Internal Medicine

## 2020-02-08 DIAGNOSIS — R109 Unspecified abdominal pain: Secondary | ICD-10-CM

## 2020-02-21 ENCOUNTER — Ambulatory Visit: Payer: Medicare Other

## 2020-02-21 ENCOUNTER — Ambulatory Visit (INDEPENDENT_AMBULATORY_CARE_PROVIDER_SITE_OTHER): Payer: Medicare Other | Admitting: Orthopaedic Surgery

## 2020-02-21 ENCOUNTER — Other Ambulatory Visit: Payer: Self-pay

## 2020-02-21 ENCOUNTER — Encounter: Payer: Self-pay | Admitting: Orthopaedic Surgery

## 2020-02-21 VITALS — BP 155/77 | HR 63 | Wt 274.0 lb

## 2020-02-21 DIAGNOSIS — M25561 Pain in right knee: Secondary | ICD-10-CM

## 2020-02-21 DIAGNOSIS — Z6841 Body Mass Index (BMI) 40.0 and over, adult: Secondary | ICD-10-CM | POA: Diagnosis not present

## 2020-02-21 DIAGNOSIS — G8929 Other chronic pain: Secondary | ICD-10-CM | POA: Diagnosis not present

## 2020-02-21 NOTE — Progress Notes (Signed)
[name] [name], female DOB:Aug 15, 1960, 59 y.o. MHD:[id number]  Chief Complaint  Patient presents with  . Knee Pain    right knee pain,     HPI  [name] is a 59 y.o. female who has increased pain in the right knee over the weekend.  She used the walker.  Her pain is less today and she is using a cane.  She has known DJD of the knees.  She is losing weight. She has lost 30 pounds over the last month.  She has no trauma.  She has swelling and popping.   Body mass index is 44.22 kg/m.  The patient meets the AMA guidelines for Morbid (severe) obesity with a BMI > 40.0 and I have recommended weight loss.    ROS  Review of Systems  Constitutional:       Patient has Diabetes Mellitus. Patient has hypertension. Patient does not have COPD or shortness of breath. Patient has BMI > 35. Patient does not have current smoking history.  HENT: Negative for congestion.   Respiratory: Positive for cough. Negative for shortness of breath.   Endocrine: Positive for cold intolerance.  Musculoskeletal: Positive for arthralgias, gait problem and joint swelling.  Allergic/Immunologic: Positive for environmental allergies.  Neurological: Positive for headaches.  Psychiatric/Behavioral: The patient is nervous/anxious.     All other systems reviewed and are negative.  The following is a summary of the past history medically, past history surgically, known current medicines, social history and family history.  This information is gathered electronically by the computer from prior information and documentation.  I review this each visit and have found including this information at this point in the chart is beneficial and informative.    Past Medical History:  Diagnosis Date  . Adenomatous polyp 2009  . Anemia 2003 after surgery needed 1 unit blood  . Anxiety   . Arthritis   . Bipolar 1 disorder ([place])   . Constipation   . Depression   . Diabetes mellitus   . Diabetes  mellitus, type II ([place])   . Diverticula of colon 2009  . Generalized headaches   . GERD (gastroesophageal reflux disease)   . History of hiatal hernia    small  . Hypertension   . Migraine   . Neuropathy    both feet  . Obesity   . Pelvic floor dysfunction    abnormal [facility]orectal [facility] in 2009  . Plantar fasciitis both feet  . Sleep apnea    cpap setting of 3.5    Past Surgical History:  Procedure Laterality Date  . ABDOMINAL HYSTERECTOMY     compete  . CATARACT EXTRACTION W/PHACO Right 02/15/2017   Procedure: CATARACT EXTRACTION PHACO AND INTRAOCULAR LENS PLACEMENT (IOC);  Surgeon: Rutherford Guys, MD;  Location: [facility];  Service: Ophthalmology;  Laterality: Right;  CDE: 4.18  . CATARACT EXTRACTION W/PHACO Left 03/01/2017   Procedure: CATARACT EXTRACTION PHACO AND INTRAOCULAR LENS PLACEMENT (IOC);  Surgeon: Rutherford Guys, MD;  Location: [facility];  Service: Ophthalmology;  Laterality: Left;  CDE: 2.56  . COLON RESECTION    03/30/2002   with end-colostomy and Hartmann's pouch  . COLON SURGERY     complicated diverticulitis requiring sigmoid resection with colostomy and subsequent takedown  . COLONOSCOPY  12/11/2007   Dr. Gala Romney- marginal prep, normal rectum pancolonic diverticula, adenomatous polyp  . COLONOSCOPY  08/28/2003      Wide open colonic anastomosis/Scattered diverticula noted throughout colon/ Small external hemorrhoids  . COLONOSCOPY  04/2012   [facility]: hyperplastic polyps, diverticulosis, ileocolonic anastomosis.  . COLONOSCOPY WITH PROPOFOL N/A 06/07/2016   Procedure: COLONOSCOPY WITH PROPOFOL;  Surgeon: Daneil Dolin, MD;  Location: [facility];  Service: Endoscopy;  Laterality: N/A;  7:30 am  . COLOSTOMY CLOSURE  07/10/2002  . ESOPHAGOGASTRODUODENOSCOPY N/A 01/08/2020   Procedure: UPPER GASTROINTESTINAL ENDOSCOPY;  Surgeon: Alphonsa Overall, MD;  Location: [facility];  Service: General;  Laterality: N/A;  . ESOPHAGOGASTRODUODENOSCOPY (EGD) WITH PROPOFOL N/A 08/23/2016    Procedure: ESOPHAGOGASTRODUODENOSCOPY (EGD) WITH PROPOFOL;  Surgeon: Alphonsa Overall, MD;  Location: [facility];  Service: General;  Laterality: N/A;  . FLEXIBLE SIGMOIDOSCOPY  01/20/2012   [facility]: incomplete/attempted colonoscopy. Inadequate prep precluded examination  . HEEL SPUR SURGERY Left 09/11/2013   both have been done  . KNEE ARTHROSCOPY  02/04/2004    left knee/partial medial meniscectomy.  Marland Kitchen KNEE SURGERY     3 arthroscopic  2 on left 1 on right  . LAPAROSCOPIC GASTRIC BANDING  2009  . LAPAROSCOPIC GASTRIC SLEEVE RESECTION N/A 01/08/2020   Procedure: LAPAROSCOPIC SLEEVE GASTRECTOMY;  Surgeon: Alphonsa Overall, MD;  Location: [facility];  Service: General;  Laterality: N/A;  . LAPAROSCOPIC SALPINGOOPHERECTOMY  03/30/2002  . TUBAL LIGATION      Family History  Problem Relation Age of Onset  . Ovarian cancer Mother   . Heart disease Mother   . Anxiety disorder Mother   . Cirrhosis Father        deceased age 31  . Alcohol abuse Father   . Liver cancer Cousin        age 55, deceased  . Drug abuse Cousin   . ADD / ADHD Son   . Anxiety disorder Sister   . Dementia Maternal Grandfather   . Colon cancer Other        aunt, deceased age 16  . Breast cancer Other        aunt, deceased age 80  . Bipolar disorder Neg Hx   . Depression Neg Hx   . OCD Neg Hx   . Paranoid behavior Neg Hx   . Schizophrenia Neg Hx   . Seizures Neg Hx   . Sexual abuse Neg Hx   . Physical abuse Neg Hx     Social History Social History   Tobacco Use  . Smoking status: Never Smoker  . Smokeless tobacco: Never Used  Vaping Use  . Vaping Use: Never used  Substance Use Topics  . Alcohol use: Not Currently    Comment: occasionally wine  . Drug use: No    Allergies  Allergen Reactions  . Bactrim Itching, Nausea And Vomiting and Other (See Comments)    Redness    Current Outpatient Medications  Medication Sig Dispense Refill  . ALPRAZolam (XANAX) 1 MG tablet Take 1 tablet (1 mg total) by mouth 3  (three) times daily as needed for anxiety. 270 tablet 1  . ARIPiprazole (ABILIFY) 10 MG tablet Take 1 tablet (10 mg total) by mouth at bedtime. 90 tablet 2  . ascorbic acid (VITAMIN C) 500 MG tablet Take 500 mg by mouth daily.    . Blood Glucose Monitoring Suppl (FREESTYLE LITE) DEVI Use to measure glucose 4 times a day 1 each 0  . Continuous Blood Gluc Receiver (FREESTYLE LIBRE 2 READER) DEVI As directed 1 each 0  . Continuous Blood Gluc Sensor (FREESTYLE LIBRE 2 SENSOR) MISC 1 Piece by Does not apply route every 14 (fourteen) days. 2 each 3  . cycloSPORINE (RESTASIS) 0.05 %  ophthalmic emulsion Place 2 drops into both eyes 2 (two) times daily.     Marland Kitchen escitalopram (LEXAPRO) 20 MG tablet Take 1 tablet (20 mg total) by mouth daily. 90 tablet 2  . fluticasone (FLONASE) 50 MCG/ACT nasal spray Place 2 sprays into the nose daily as needed for allergies.     Marland Kitchen gabapentin (NEURONTIN) 300 MG capsule Take 300 mg by mouth 2 (two) times daily.    Marland Kitchen glucose blood test strip Test 4 times a day, she has freestyle lite meter 150 each 2  . insulin glargine (LANTUS SOLOSTAR) 100 UNIT/ML Solostar Pen Inject 20 Units into the skin at bedtime. 15 mL 1  . Insulin Pen Needle (B-D ULTRAFINE III SHORT PEN) 31G X 8 MM MISC 1 each by Does not apply route as directed. 100 each 3  . lisinopril (ZESTRIL) 5 MG tablet Take 5 mg by mouth daily.    Marland Kitchen loratadine (CLARITIN) 10 MG tablet Take 10 mg by mouth daily.    . meclizine (ANTIVERT) 25 MG tablet Take 25 mg by mouth 2 (two) times daily as needed for dizziness.   2  . Menthol, Topical Analgesic, (ICY HOT EX) Apply 1 application topically 4 (four) times daily as needed (joint pain.).    Marland Kitchen methocarbamol (ROBAXIN) 500 MG tablet Take 500 mg by mouth 3 (three) times daily as needed for muscle spasms.     . Multiple Vitamin (MULTIVITAMIN WITH MINERALS) TABS tablet Take 1 tablet by mouth daily.    . ondansetron (ZOFRAN ODT) 4 MG disintegrating tablet Take 1 tablet (4 mg total) by mouth  every 8 (eight) hours as needed for nausea or vomiting. 10 tablet 0  . pantoprazole (PROTONIX) 40 MG tablet Take 1 tablet (40 mg total) by mouth 2 (two) times daily. (Patient taking differently: Take 40 mg by mouth daily before breakfast. ) 180 tablet 3  . Probiotic CHEW Chew 1-2 capsules by mouth daily. *Hold for diarrhea* (Gummy)    . Propylene Glycol-Glycerin (SOOTHE) 0.6-0.6 % SOLN Place 1 drop into both eyes 2 (two) times daily.    . rizatriptan (MAXALT-MLT) 10 MG disintegrating tablet Take 10 mg by mouth as needed for migraine. May repeat in 2 hours if needed    . temazepam (RESTORIL) 30 MG capsule Take 1 capsule (30 mg total) by mouth at bedtime as needed for sleep. 90 capsule 2  . traMADol (ULTRAM) 50 MG tablet One tablet every six hours as needed for pain. 40 tablet 3   No current facility-administered medications for this visit.     Physical Exam  Blood pressure (!) 155/77, pulse 63, weight 274 lb (124.3 kg).  Constitutional: overall normal hygiene, normal nutrition, well developed, normal grooming, normal body habitus. Assistive device:cane  Musculoskeletal: gait and station Limp right, muscle tone and strength are normal, no tremors or atrophy is present.  .  Neurological: coordination overall normal.  Deep tendon reflex/nerve stretch intact.  Sensation normal.  Cranial nerves II-XII intact.   Skin:   Normal overall no scars, lesions, ulcers or rashes. No psoriasis.  Psychiatric: Alert and oriented x 3.  Recent memory intact, remote memory unclear.  Normal mood and affect. Well groomed.  Good eye contact.  Cardiovascular: overall no swelling, no varicosities, no edema bilaterally, normal temperatures of the legs and arms, no clubbing, cyanosis and good capillary refill.  Lymphatic: palpation is normal.  Right knee pain, crepitus, ROM 0 to 100, limp right, painful knee.  NV intact.  All other systems reviewed  and are negative   The patient has been educated about the  nature of the problem(s) and counseled on treatment options.  The patient appeared to understand what I have discussed and is in agreement with it.  Encounter Diagnosis  Name Primary?  . Chronic pain of right knee Yes   X-rays were done of the right knee, reported separately.  PLAN Call if any problems.  Precautions discussed.  Continue current medications.   Return to clinic 1 month   Electronically Signed Sanjuana Kava, MD 10/14/202111:33 AM

## 2020-03-04 ENCOUNTER — Encounter: Payer: Medicare Other | Attending: Physician Assistant | Admitting: Dietician

## 2020-03-04 ENCOUNTER — Other Ambulatory Visit: Payer: Self-pay

## 2020-03-04 ENCOUNTER — Encounter: Payer: Self-pay | Admitting: Dietician

## 2020-03-04 DIAGNOSIS — Z713 Dietary counseling and surveillance: Secondary | ICD-10-CM | POA: Diagnosis not present

## 2020-03-04 DIAGNOSIS — G4730 Sleep apnea, unspecified: Secondary | ICD-10-CM | POA: Insufficient documentation

## 2020-03-04 DIAGNOSIS — Z9884 Bariatric surgery status: Secondary | ICD-10-CM | POA: Diagnosis not present

## 2020-03-04 DIAGNOSIS — E669 Obesity, unspecified: Secondary | ICD-10-CM

## 2020-03-04 DIAGNOSIS — Z6841 Body Mass Index (BMI) 40.0 and over, adult: Secondary | ICD-10-CM | POA: Insufficient documentation

## 2020-03-04 DIAGNOSIS — I10 Essential (primary) hypertension: Secondary | ICD-10-CM | POA: Diagnosis not present

## 2020-03-04 DIAGNOSIS — Z79899 Other long term (current) drug therapy: Secondary | ICD-10-CM | POA: Insufficient documentation

## 2020-03-04 DIAGNOSIS — E1169 Type 2 diabetes mellitus with other specified complication: Secondary | ICD-10-CM | POA: Insufficient documentation

## 2020-03-04 NOTE — Progress Notes (Signed)
Bariatric Nutrition Follow-Up Visit Medical Nutrition Therapy  Appt Start Time: 2:00pm    End Time: 2:30pm  2 Months Post-Operative Sleeve Surgery Surgery Date: 01/08/2020  Pt's Expectations of Surgery/ Goals: to be able to play with grandchildren, not feel fatigued constantly, rid of headaches, improve energy, knee replacements Pt Reported Successes: does not need cane to walk, less knee pain, feels better, looks better, back at the gym, coming off meds   NUTRITION ASSESSMENT  Anthropometrics  Start weight at [facility]: 302 lbs (date: 07/03/2019) Today's weight: 275.1 lbs  Body Composition Scale 01/22/20 03/03/20  Weight  lbs 287.2 275.1  BMI 46.4 44.4  Total Body Fat  % 48.8 -     Visceral Fat 21 -  Fat-Free Mass  % 51.1 -     Total Body Water  % 40 -     Muscle-Mass  lbs 31.5 -  Body Fat Displacement --- ---         Torso  lbs 87.1 -         Left Leg  lbs 17.4 -         Right Leg  lbs 17.4 -         Left Arm  lbs 8.7 -         Right Arm  lbs 8.7 -    Lifestyle & Dietary Hx Patient reports she does very well with chewing her foods and has not had sickness or nausea. No issues with tolerating foods/fluids and states she gets full very quickly. Typical meal pattern is 1 shake, 1 meal, and 1 snack per day. Drinks 2-3 bottles of water plus other bariatric approved fluids daily.    24-Hr Dietary Recall First Meal: protein shake Snack: - Second Meal: [place] slice + babybel cheese + mustard  Snack: rice cakes  Third Meal: - Snack: - Beverages: water, Gatorade Zero, soymilk, black decaf coffee, decaf unsweet tea  Estimated daily fluid intake: 64+ oz Estimated daily protein intake: 50-60 g Supplements: bariatric chewable, calcium  Current average weekly physical activity: walking, swimming   Post-Op Goals/ Signs/ Symptoms Using straws: no Drinking while eating: no Chewing/swallowing difficulties: no Changes in vision: no Changes to mood/headaches: no Hair loss/changes to  skin/nails: no Difficulty focusing/concentrating: no Sweating: no Dizziness/lightheadedness: no Palpitations: no  Carbonated/caffeinated beverages: no N/V/D/C/Gas: no Abdominal pain: no Dumping syndrome: no   NUTRITION DIAGNOSIS  Overweight/obesity ([place]-3.3) related to past poor dietary habits and physical inactivity as evidenced by completed bariatric surgery and following dietary guidelines for continued weight loss and healthy nutrition status.   NUTRITION INTERVENTION Nutrition counseling (C-1) and education (E-2) to facilitate bariatric surgery goals, including: . Diet advancement to the next phase (phase 4) now including non-starchy vegetables . The importance of consuming adequate calories as well as certain nutrients daily due to the body's need for essential vitamins, minerals, and fats . The importance of daily physical activity and to reach a goal of at least 150 minutes of moderate to vigorous physical activity weekly (or as directed by their physician) due to benefits such as increased musculature and improved lab values  Handouts Provided Include   Phase 4: Protein + Non-Starchy Vegetables  Learning Style & Readiness for Change Teaching method utilized: Visual & Auditory  Demonstrated degree of understanding via: Teach Back  Barriers to learning/adherence to lifestyle change: None Identified   MONITORING & EVALUATION Dietary intake, weekly physical activity, body weight, and goals in 4 months.  Next Steps Patient is to follow-up in  4 months for 6 month post-op follow-up.

## 2020-03-04 NOTE — Patient Instructions (Signed)
.   Continue to aim for a minimum of 64 fluid ounces daily with at least 32 ounces being plain water . Eat non-starchy vegetables 2 times a day 7 days a week . Start out with soft cooked vegetables today and tomorrow; if tolerated, begin to eat raw vegetables including salads . Per meal/snack, eat 3 ounces of protein first then non-starchy vegetables o Once you understand how much of your meal leads to satisfaction (not full) while still eating 3 ounces of protein and non-starchy vegetables, you can eat them in any order (figure out how much you can eat at a time to get enough but not too much)  . Continue to aim for 30 minutes of physical activity at least 5 times a week . Remember to take 3 calcium's plus your bariatric multivitamin DAILY. If you haven't already, you may now switch from a gummy/chewable to a capsule.

## 2020-03-06 ENCOUNTER — Other Ambulatory Visit: Payer: Self-pay

## 2020-03-06 ENCOUNTER — Ambulatory Visit (INDEPENDENT_AMBULATORY_CARE_PROVIDER_SITE_OTHER): Payer: Medicare Other | Admitting: Adult Health

## 2020-03-06 ENCOUNTER — Encounter: Payer: Self-pay | Admitting: Adult Health

## 2020-03-06 VITALS — BP 118/76 | HR 62 | Ht 66.0 in | Wt 276.2 lb

## 2020-03-06 DIAGNOSIS — G4733 Obstructive sleep apnea (adult) (pediatric): Secondary | ICD-10-CM

## 2020-03-06 DIAGNOSIS — Z9989 Dependence on other enabling machines and devices: Secondary | ICD-10-CM | POA: Diagnosis not present

## 2020-03-06 NOTE — Patient Instructions (Signed)
Continue using CPAP nightly and greater than 4 hours each night °If your symptoms worsen or you develop new symptoms please let us know.  ° °

## 2020-03-06 NOTE — Progress Notes (Signed)
PATIENT: [name] DOB: 17-Jan-1961  REASON FOR VISIT: follow up HISTORY FROM: patient  HISTORY OF PRESENT ILLNESS: Today 03/06/20:  Ms. Donlon is a 59 year old female with a history of obstructive sleep apnea on CPAP. She returns today for follow-up. We were unable to get a wireless download. The nurse is reaching out to her DME company. The patient states that she has been using it nightly. She did have gastric sleeve surgery and use the hospital machine during that time. She states that she has lost approximately 20 pounds and is feeling much better. She returns today for an evaluation.  HISTORY 03/07/19:  Ms. Shifflett is a 59 year old female with a history of obstructive sleep apnea on CPAP.  She returns today for follow-up.  She received a new CPAP machine in July.  Unfortunately we were unable to obtain a download today.  According to her DME company she was not set up for wireless.  The patient states that she is using the CPAP nightly.  She states that she will not sleep at night without it.  She denies any issues with the CPAP or the mask.  She returns today for an evaluation.  REVIEW OF SYSTEMS: Out of a complete 14 system review of symptoms, the patient complains only of the following symptoms, and all other reviewed systems are negative.  ESS 6  ALLERGIES: Allergies  Allergen Reactions  . Bactrim Itching, Nausea And Vomiting and Other (See Comments)    Redness    HOME MEDICATIONS: Outpatient Medications Prior to Visit  Medication Sig Dispense Refill  . ALPRAZolam (XANAX) 1 MG tablet Take 1 tablet (1 mg total) by mouth 3 (three) times daily as needed for anxiety. 270 tablet 1  . ARIPiprazole (ABILIFY) 10 MG tablet Take 1 tablet (10 mg total) by mouth at bedtime. 90 tablet 2  . ascorbic acid (VITAMIN C) 500 MG tablet Take 500 mg by mouth daily.    . Blood Glucose Monitoring Suppl (FREESTYLE LITE) DEVI Use to measure glucose 4 times a day 1 each 0  .  Continuous Blood Gluc Receiver (FREESTYLE LIBRE 2 READER) DEVI As directed 1 each 0  . Continuous Blood Gluc Sensor (FREESTYLE LIBRE 2 SENSOR) MISC 1 Piece by Does not apply route every 14 (fourteen) days. 2 each 3  . cycloSPORINE (RESTASIS) 0.05 % ophthalmic emulsion Place 2 drops into both eyes 2 (two) times daily.     Marland Kitchen escitalopram (LEXAPRO) 20 MG tablet Take 1 tablet (20 mg total) by mouth daily. 90 tablet 2  . fluticasone (FLONASE) 50 MCG/ACT nasal spray Place 2 sprays into the nose daily as needed for allergies.     Marland Kitchen gabapentin (NEURONTIN) 300 MG capsule Take 300 mg by mouth 2 (two) times daily.    Marland Kitchen glucose blood test strip Test 4 times a day, she has freestyle lite meter 150 each 2  . insulin glargine (LANTUS SOLOSTAR) 100 UNIT/ML Solostar Pen Inject 20 Units into the skin at bedtime. (Patient taking differently: Inject 30 Units into the skin at bedtime. ) 15 mL 1  . Insulin Pen Needle (B-D ULTRAFINE III SHORT PEN) 31G X 8 MM MISC 1 each by Does not apply route as directed. 100 each 3  . lisinopril (ZESTRIL) 5 MG tablet Take 5 mg by mouth daily.    Marland Kitchen loratadine (CLARITIN) 10 MG tablet Take 10 mg by mouth daily.    . meclizine (ANTIVERT) 25 MG tablet Take 25 mg by mouth 2 (two) times  daily as needed for dizziness.   2  . Menthol, Topical Analgesic, (ICY HOT EX) Apply 1 application topically 4 (four) times daily as needed (joint pain.).    Marland Kitchen metFORMIN (GLUCOPHAGE) 1000 MG tablet Take 1,000 mg by mouth daily with breakfast.    . methocarbamol (ROBAXIN) 500 MG tablet Take 500 mg by mouth 3 (three) times daily as needed for muscle spasms.     . Multiple Vitamin (MULTIVITAMIN WITH MINERALS) TABS tablet Take 1 tablet by mouth daily.    . ondansetron (ZOFRAN ODT) 4 MG disintegrating tablet Take 1 tablet (4 mg total) by mouth every 8 (eight) hours as needed for nausea or vomiting. 10 tablet 0  . pantoprazole (PROTONIX) 40 MG tablet Take 1 tablet (40 mg total) by mouth 2 (two) times daily. (Patient  taking differently: Take 40 mg by mouth daily before breakfast. ) 180 tablet 3  . Probiotic CHEW Chew 1-2 capsules by mouth daily. *Hold for diarrhea* (Gummy)    . Propylene Glycol-Glycerin (SOOTHE) 0.6-0.6 % SOLN Place 1 drop into both eyes 2 (two) times daily.    . rizatriptan (MAXALT-MLT) 10 MG disintegrating tablet Take 10 mg by mouth as needed for migraine. May repeat in 2 hours if needed    . temazepam (RESTORIL) 30 MG capsule Take 1 capsule (30 mg total) by mouth at bedtime as needed for sleep. 90 capsule 2  . traMADol (ULTRAM) 50 MG tablet One tablet every six hours as needed for pain. 40 tablet 3   No facility-administered medications prior to visit.    PAST MEDICAL HISTORY: Past Medical History:  Diagnosis Date  . Adenomatous polyp 2009  . Anemia 2003 after surgery needed 1 unit blood  . Anxiety   . Arthritis   . Bipolar 1 disorder ([place])   . Constipation   . Depression   . Diabetes mellitus   . Diabetes mellitus, type II ([place])   . Diverticula of colon 2009  . Generalized headaches   . GERD (gastroesophageal reflux disease)   . History of hiatal hernia    small  . Hypertension   . Migraine   . Neuropathy    both feet  . Obesity   . Pelvic floor dysfunction    abnormal anorectal manometry at [facility] in 2009  . Plantar fasciitis both feet  . Sleep apnea    cpap setting of 3.5    PAST SURGICAL HISTORY: Past Surgical History:  Procedure Laterality Date  . ABDOMINAL HYSTERECTOMY     compete  . CATARACT EXTRACTION W/PHACO Right 02/15/2017   Procedure: CATARACT EXTRACTION PHACO AND INTRAOCULAR LENS PLACEMENT (IOC);  Surgeon: Rutherford Guys, MD;  Location: [facility];  Service: Ophthalmology;  Laterality: Right;  CDE: 4.18  . CATARACT EXTRACTION W/PHACO Left 03/01/2017   Procedure: CATARACT EXTRACTION PHACO AND INTRAOCULAR LENS PLACEMENT (IOC);  Surgeon: Rutherford Guys, MD;  Location: [facility];  Service: Ophthalmology;  Laterality: Left;  CDE: 2.56  . COLON RESECTION    03/30/2002    with end-colostomy and Hartmann's pouch  . COLON SURGERY     complicated diverticulitis requiring sigmoid resection with colostomy and subsequent takedown  . COLONOSCOPY  12/11/2007   Dr. Gala Romney- marginal prep, normal rectum pancolonic diverticula, adenomatous polyp  . COLONOSCOPY  08/28/2003      Wide open colonic anastomosis/Scattered diverticula noted throughout colon/ Small external hemorrhoids  . COLONOSCOPY  04/2012   [facility]: hyperplastic polyps, diverticulosis, ileocolonic anastomosis.  . COLONOSCOPY WITH PROPOFOL N/A 06/07/2016   Procedure: COLONOSCOPY WITH  PROPOFOL;  Surgeon: Daneil Dolin, MD;  Location: [facility];  Service: Endoscopy;  Laterality: N/A;  7:30 am  . COLOSTOMY CLOSURE  07/10/2002  . ESOPHAGOGASTRODUODENOSCOPY N/A 01/08/2020   Procedure: UPPER GASTROINTESTINAL ENDOSCOPY;  Surgeon: Alphonsa Overall, MD;  Location: [facility];  Service: General;  Laterality: N/A;  . ESOPHAGOGASTRODUODENOSCOPY (EGD) WITH PROPOFOL N/A 08/23/2016   Procedure: ESOPHAGOGASTRODUODENOSCOPY (EGD) WITH PROPOFOL;  Surgeon: Alphonsa Overall, MD;  Location: [facility];  Service: General;  Laterality: N/A;  . FLEXIBLE SIGMOIDOSCOPY  01/20/2012   [facility]: incomplete/attempted colonoscopy. Inadequate prep precluded examination  . HEEL SPUR SURGERY Left 09/11/2013   both have been done  . KNEE ARTHROSCOPY  02/04/2004    left knee/partial medial meniscectomy.  Marland Kitchen KNEE SURGERY     3 arthroscopic  2 on left 1 on right  . LAPAROSCOPIC GASTRIC BANDING  2009  . LAPAROSCOPIC GASTRIC SLEEVE RESECTION N/A 01/08/2020   Procedure: LAPAROSCOPIC SLEEVE GASTRECTOMY;  Surgeon: Alphonsa Overall, MD;  Location: [facility];  Service: General;  Laterality: N/A;  . LAPAROSCOPIC SALPINGOOPHERECTOMY  03/30/2002  . TUBAL LIGATION      FAMILY HISTORY: Family History  Problem Relation Age of Onset  . Ovarian cancer Mother   . Heart disease Mother   . Anxiety disorder Mother   . Cirrhosis Father        deceased age 32  . Alcohol abuse  Father   . Liver cancer Cousin        age 19, deceased  . Drug abuse Cousin   . ADD / ADHD Son   . Anxiety disorder Sister   . Dementia Maternal Grandfather   . Colon cancer Other        aunt, deceased age 76  . Breast cancer Other        aunt, deceased age 79  . Bipolar disorder Neg Hx   . Depression Neg Hx   . OCD Neg Hx   . Paranoid behavior Neg Hx   . Schizophrenia Neg Hx   . Seizures Neg Hx   . Sexual abuse Neg Hx   . Physical abuse Neg Hx     SOCIAL HISTORY: Social History   Socioeconomic History  . Marital status: Married    Spouse name: Not on file  . Number of children: 2  . Years of education: college  . Highest education level: Not on file  Occupational History  . 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 Smoking status: Never Smoker  . Smokeless tobacco: Never Used  Vaping Use  . Vaping Use: Never used  Substance and Sexual Activity  . Alcohol use: Not Currently    Comment: occasionally wine  . Drug use: No  . Sexual activity: Not Currently    Birth control/protection: Surgical  Other Topics Concern  . Not on file  Social History Narrative  . Not on file   Social Determinants of Health   Financial Resource Strain:   . Difficulty of Paying Living Expenses: Not on file  Food Insecurity:   . Worried About Charity fundraiser in the Last Year: Not on file  . Ran Out of Food in the Last Year: Not on file  Transportation Needs:   . Lack of Transportation (Medical): Not on file  . Lack of Transportation (Non-Medical): Not on file  Physical Activity:   . Days of Exercise per Week: Not on file  . Minutes of Exercise per  Session: Not on file  Stress:   . Feeling of Stress : Not on file  Social Connections:   . Frequency of Communication with Friends and Family: Not on file  . Frequency of Social Gatherings with Friends and Family: Not on file  . Attends Religious Services: Not on file  . Active  Member of Clubs or Organizations: Not on file  . Attends Archivist Meetings: Not on file  . Marital Status: Not on file  Intimate Partner Violence:   . Fear of Current or Ex-Partner: Not on file  . Emotionally Abused: Not on file  . Physically Abused: Not on file  . Sexually Abused: Not on file      PHYSICAL EXAM  Vitals:   03/06/20 1341  BP: 118/76  Pulse: 62  Weight: 276 lb 3.2 oz (125.3 kg)  Height: 5\' 6"  (1.676 m)   Body mass index is 44.58 kg/m.  Generalized: Well developed, in no acute distress  Chest: Lungs clear to auscultation bilaterally  Neurological examination  Mentation: Alert oriented to time, place, history taking. Follows all commands speech and language fluent Cranial nerve II-XII: Extraocular movements were full, visual field were full on confrontational test Head turning and shoulder shrug  were normal and symmetric. Motor: The motor testing reveals 5 over 5 strength of all 4 extremities. Good symmetric motor tone is noted throughout.  Sensory: Sensory testing is intact to soft touch on all 4 extremities. No evidence of extinction is noted.  Gait and station: Gait is normal.    DIAGNOSTIC DATA (LABS, IMAGING, TESTING) - I reviewed patient records, labs, notes, testing and imaging myself where available.  Lab Results  Component Value Date   WBC 11.0 (H) 01/09/2020   HGB 12.8 01/09/2020   HCT 41.0 01/09/2020   MCV 91.3 01/09/2020   PLT 215 01/09/2020      Component Value Date/Time   NA 140 01/01/2020 1356   NA 136 12/06/2019 0808   K 4.5 01/01/2020 1356   CL 103 01/01/2020 1356   CO2 28 01/01/2020 1356   GLUCOSE 116 (H) 01/01/2020 1356   BUN 19 01/01/2020 1356   BUN 15 12/06/2019 0808   CREATININE 0.67 01/01/2020 1356   CREATININE 0.74 03/23/2011 0400   CALCIUM 9.1 01/01/2020 1356   PROT 7.7 01/01/2020 1356   PROT 7.4 12/06/2019 0808   ALBUMIN 4.1 01/01/2020 1356   ALBUMIN 4.5 12/06/2019 0808   AST 52 (H) 01/01/2020 1356    ALT 51 (H) 01/01/2020 1356   ALKPHOS 78 01/01/2020 1356   BILITOT 0.6 01/01/2020 1356   BILITOT 0.4 12/06/2019 0808   GFRNONAA >60 01/01/2020 1356   GFRAA >60 01/01/2020 1356   Lab Results  Component Value Date   CHOL 196 12/06/2019   HDL 52 12/06/2019   LDLCALC 119 (H) 12/06/2019   TRIG 144 12/06/2019   Lab Results  Component Value Date   HGBA1C 9.7 (H) 01/01/2020   Lab Results  Component Value Date   [id number] 03/05/2013   Lab Results  Component Value Date   TSH 1.560 12/06/2019      ASSESSMENT AND PLAN 59 y.o. year old female  has a past medical history of Adenomatous polyp (2009), Anemia (2003 after surgery needed 1 unit blood), Anxiety, Arthritis, Bipolar 1 disorder ([place]), Constipation, Depression, Diabetes mellitus, Diabetes mellitus, type II ([place]), Diverticula of colon (2009), Generalized headaches, GERD (gastroesophageal reflux disease), History of hiatal hernia, Hypertension, Migraine, Neuropathy, Obesity, Pelvic floor dysfunction, Plantar fasciitis (both  feet), and Sleep apnea. here with:  1. OSA on CPAP  - CPAP compliance excellent - Good treatment of AHI  - Encourage patient to use CPAP nightly and > 4 hours each night - F/U in 1 year or sooner if needed   I spent 20 minutes of face-to-face and non-face-to-face time with patient.  This included previsit chart review, lab review, study review, order entry, electronic health record documentation, patient education.  Ward Givens, MSN, NP-C 03/06/2020, 2:29 PM [facility] [address] [place], [place] [address] [phone]

## 2020-03-10 ENCOUNTER — Ambulatory Visit (HOSPITAL_COMMUNITY)
Admission: RE | Admit: 2020-03-10 | Discharge: 2020-03-10 | Disposition: A | Discharge status: Home / Self Care | Payer: Medicare Other | Source: Ambulatory Visit | Attending: Family Medicine | Admitting: Family Medicine

## 2020-03-10 ENCOUNTER — Other Ambulatory Visit: Payer: Self-pay

## 2020-03-10 DIAGNOSIS — Z1231 Encounter for screening mammogram for malignant neoplasm of breast: Secondary | ICD-10-CM | POA: Insufficient documentation

## 2020-03-25 ENCOUNTER — Ambulatory Visit: Payer: Medicare Other | Admitting: Orthopaedic Surgery

## 2020-04-22 ENCOUNTER — Encounter: Payer: Self-pay | Admitting: "Endocrinology

## 2020-04-22 ENCOUNTER — Ambulatory Visit (INDEPENDENT_AMBULATORY_CARE_PROVIDER_SITE_OTHER): Payer: Medicare Other | Admitting: "Endocrinology

## 2020-04-22 ENCOUNTER — Other Ambulatory Visit: Payer: Self-pay

## 2020-04-22 VITALS — BP 150/81 | HR 69 | Ht 66.0 in | Wt 263.0 lb

## 2020-04-22 DIAGNOSIS — E1165 Type 2 diabetes mellitus with hyperglycemia: Secondary | ICD-10-CM | POA: Diagnosis not present

## 2020-04-22 DIAGNOSIS — E782 Mixed hyperlipidemia: Secondary | ICD-10-CM | POA: Diagnosis not present

## 2020-04-22 DIAGNOSIS — E559 Vitamin D deficiency, unspecified: Secondary | ICD-10-CM | POA: Diagnosis not present

## 2020-04-22 DIAGNOSIS — I10 Essential (primary) hypertension: Secondary | ICD-10-CM | POA: Diagnosis not present

## 2020-04-22 LAB — POCT GLYCOSYLATED HEMOGLOBIN (HGB A1C): HbA1c, POC (controlled diabetic range): 6.5 % (ref 0.0–7.0)

## 2020-04-22 MED ORDER — METFORMIN HCL 1000 MG PO TABS: 1000.0000 mg | ORAL_TABLET | Freq: Two times a day (BID) | ORAL | 1 refills | Status: DC

## 2020-04-22 MED ORDER — LANTUS SOLOSTAR 100 UNIT/ML ~~LOC~~ SOPN: 20.0000 [IU] | PEN_INJECTOR | Freq: Every day | SUBCUTANEOUS | 1 refills | Status: DC

## 2020-04-22 NOTE — Patient Instructions (Signed)
                                     Advice for Weight Management  -For most of us the best way to lose weight is by diet management. Generally speaking, diet management means consuming less calories intentionally which over time brings about progressive weight loss.  This can be achieved more effectively by restricting carbohydrate consumption to the minimum possible.  So, it is critically important to know your numbers: how much calorie you are consuming and how much calorie you need. More importantly, our carbohydrates sources should be unprocessed or minimally processed complex starch food items.   Sometimes, it is important to balance nutrition by increasing protein intake (animal or plant source), fruits, and vegetables.  -Sticking to a routine mealtime to eat 3 meals a day and avoiding unnecessary snacks is shown to have a big role in weight control. Under normal circumstances, the only time we lose real weight is when we are hungry, so allow hunger to take place- hunger means no food between meal times, only water.  It is not advisable to starve.   -It is better to avoid simple carbohydrates including: Cakes, Sweet Desserts, Ice Cream, Soda (diet and regular), Sweet Tea, Candies, Chips, Cookies, Store Bought Juices, Alcohol in Excess of  1-2 drinks a day, Lemonade,  Artificial Sweeteners, Doughnuts, Coffee Creamers, "Sugar-free" Products, etc, etc.  This is not a complete list.....    -Consulting with certified diabetes educators is proven to provide you with the most accurate and current information on diet.  Also, you may be  interested in discussing diet options/exchanges , we can schedule a visit with [name], RDN, CDE for individualized nutrition education.  -Exercise: If you are able: 30 -60 minutes a day ,4 days a week, or 150 minutes a week.  The longer the better.  Combine stretch, strength, and aerobic activities.  If you were told in the  past that you have high risk for cardiovascular diseases, you may seek evaluation by your heart doctor prior to initiating moderate to intense exercise programs.                                  Additional Care Considerations for Diabetes   -Diabetes  is a chronic disease.  The most important care consideration is regular follow-up with your diabetes care provider with the goal being avoiding or delaying its complications and to take advantage of advances in medications and technology.    -Type 2 diabetes is known to coexist with other important comorbidities such as high blood pressure and high cholesterol.  It is critical to control not only the diabetes but also the high blood pressure and high cholesterol to minimize and delay the risk of complications including coronary artery disease, stroke, amputations, blindness, etc.    - Studies showed that people with diabetes will benefit from a class of medications known as ACE inhibitors and statins.  Unless there are specific reasons not to be on these medications, the standard of care is to consider getting one from these groups of medications at an optimal doses.  These medications are generally considered safe and proven to help protect the heart and the kidneys.    - People with diabetes are encouraged to initiate and maintain regular follow-up with eye doctors, foot   doctors, dentists , and if necessary heart and kidney doctors.     - It is highly recommended that people with diabetes quit smoking or stay away from smoking, and get yearly  flu vaccine and pneumonia vaccine at least every 5 years.  One other important lifestyle recommendation is to ensure adequate sleep - at least 6-7 hours of uninterrupted sleep at night.  -Exercise: If you are able: 30 -60 minutes a day, 4 days a week, or 150 minutes a week.  The longer the better.  Combine stretch, strength, and aerobic activities.  If you were told in the past that you have high risk for  cardiovascular diseases, you may seek evaluation by your heart doctor prior to initiating moderate to intense exercise programs.          

## 2020-04-22 NOTE — Progress Notes (Signed)
04/22/2020, 5:14 PM                                     Endocrinology follow-up note   Subjective:    Patient ID: [name], female    DOB: 12/18/1960.  [name] is being seen in follow up after she was seen in consultation for management of currently uncontrolled symptomatic diabetes requested by  Sharilyn Sites, MD.   Past Medical History:  Diagnosis Date   Adenomatous polyp 2009   Anemia 2003 after surgery needed 1 unit blood   Anxiety    Arthritis    Bipolar 1 disorder ([place])    Constipation    Depression    Diabetes mellitus    Diabetes mellitus, type II ([place])    Diverticula of colon 2009   Generalized headaches    GERD (gastroesophageal reflux disease)    History of hiatal hernia    small   Hypertension    Migraine    Neuropathy    both feet   Obesity    Pelvic floor dysfunction    abnormal [facility]orectal [facility] in [facility]   Plantar fasciitis both feet   Sleep apnea    cpap setting of 3.5    Past Surgical History:  Procedure Laterality Date   ABDOMINAL HYSTERECTOMY     compete   CATARACT EXTRACTION W/PHACO Right 02/15/2017   Procedure: CATARACT EXTRACTION PHACO AND INTRAOCULAR LENS PLACEMENT ([place]);  Surgeon: Rutherford Guys, MD;  Location: [facility];  Service: Ophthalmology;  Laterality: Right;  CDE: 4.18   CATARACT EXTRACTION W/PHACO Left 03/01/2017   Procedure: CATARACT EXTRACTION PHACO AND INTRAOCULAR LENS PLACEMENT ([facility]);  Surgeon: Rutherford Guys, MD;  Location: [facility];  Service: Ophthalmology;  Laterality: Left;  CDE: 2.56   COLON RESECTION    03/30/2002   with end-colostomy and Hartmann's pouch   COLON SURGERY     complicated diverticulitis requiring sigmoid resection with colostomy and subsequent takedown   COLONOSCOPY  12/11/2007   Dr. Gala Romney- marginal prep, normal rectum pancolonic diverticula, adenomatous polyp   COLONOSCOPY  08/28/2003      Wide open colonic anastomosis/Scattered  diverticula noted throughout colon/ Small external hemorrhoids   COLONOSCOPY  04/2012   [facility]: hyperplastic polyps, diverticulosis, ileocolonic anastomosis.   COLONOSCOPY WITH PROPOFOL N/A 06/07/2016   Procedure: COLONOSCOPY WITH PROPOFOL;  Surgeon: Daneil Dolin, MD;  Location: [facility];  Service: Endoscopy;  Laterality: N/A;  7:30 am   COLOSTOMY CLOSURE  07/10/2002   ESOPHAGOGASTRODUODENOSCOPY N/A 01/08/2020   Procedure: UPPER GASTROINTESTINAL ENDOSCOPY;  Surgeon: Alphonsa Overall, MD;  Location: [facility];  Service: General;  Laterality: N/A;   ESOPHAGOGASTRODUODENOSCOPY (EGD) WITH PROPOFOL N/A 08/23/2016   Procedure: ESOPHAGOGASTRODUODENOSCOPY (EGD) WITH PROPOFOL;  Surgeon: Alphonsa Overall, MD;  Location: [facility];  Service: General;  Laterality: N/A;   FLEXIBLE SIGMOIDOSCOPY  01/20/2012   R[facility]: incomplete/attempted colonoscopy. Inadequate prep precluded examination   HEEL SPUR SURGERY Left 09/11/2013   both have been done   KNEE ARTHROSCOPY  02/04/2004    left knee/partial medial meniscectomy.   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    Employer: DISABLED  Tobacco Use   Smoking status: Never Smoker   Smokeless tobacco: Never Used  Scientific laboratory technician Use: Never used  Substance and Sexual Activity   Alcohol use: Not Currently    Comment: occasionally wine   Drug use: No   Sexual activity:  Not Currently    Birth control/protection: Surgical  Other Topics Concern   Not on file  Social History Narrative   Not on file   Social Determinants of Health   Financial Resource Strain: Not on file  Food Insecurity: Not on file  Transportation Needs: Not on file  Physical Activity: Not on file  Stress: Not on file  Social Connections: Not on file    Family History  Problem Relation Age of Onset   Ovarian cancer Mother    Heart disease Mother    Anxiety disorder Mother    Cirrhosis Father        deceased age 44   Alcohol abuse Father    Liver cancer Cousin        age 24, deceased   Drug abuse Cousin    ADD / ADHD Son    Anxiety disorder Sister    Dementia Maternal Grandfather    Colon cancer Other        aunt, deceased age 61   Breast cancer Other        aunt, deceased age 51   Bipolar disorder Neg Hx    Depression Neg Hx    OCD Neg Hx    Paranoid behavior Neg Hx    Schizophrenia Neg Hx    Seizures Neg Hx    Sexual abuse Neg Hx    Physical abuse Neg Hx     Outpatient Encounter Medications as of 04/22/2020  Medication Sig   ALPRAZolam (XANAX) 1 MG tablet Take 1 tablet (1 mg total) by mouth 3 (three) times daily as needed for anxiety.   ARIPiprazole (ABILIFY) 10 MG tablet Take 1 tablet (10 mg total) by mouth at bedtime.   ascorbic acid (VITAMIN C) 500 MG tablet Take 500 mg by mouth daily.   Blood Glucose Monitoring Suppl (FREESTYLE LITE) DEVI Use to measure glucose 4 times a day   Continuous Blood Gluc Receiver (FREESTYLE LIBRE 2 READER) DEVI As directed   Continuous Blood Gluc Sensor (FREESTYLE LIBRE 2 SENSOR) MISC 1 Piece by Does not apply route every 14 (fourteen) days.   cycloSPORINE (RESTASIS) 0.05 % ophthalmic emulsion Place 2 drops into both eyes 2 (two) times daily.    escitalopram (LEXAPRO) 20 MG tablet Take 1 tablet (20 mg total) by mouth daily.   fluticasone (FLONASE) 50 MCG/ACT nasal spray Place 2 sprays into the nose  daily as needed for allergies.    gabapentin (NEURONTIN) 300 MG capsule Take 300 mg by mouth 2 (two) times daily.   glucose blood test strip Test 4 times a day, she has freestyle lite meter   insulin glargine (LANTUS SOLOSTAR) 100 UNIT/ML Solostar Pen Inject 20 Units into the skin at bedtime.   Insulin Pen Needle (B-D ULTRAFINE III SHORT PEN) 31G  X 8 MM MISC 1 each by Does not apply route as directed.   lisinopril (ZESTRIL) 5 MG tablet Take 5 mg by mouth daily.   loratadine (CLARITIN) 10 MG tablet Take 10 mg by mouth daily.   meclizine (ANTIVERT) 25 MG tablet Take 25 mg by mouth 2 (two) times daily as needed for dizziness.    Menthol, Topical Analgesic, (ICY HOT EX) Apply 1 application topically 4 (four) times daily as needed (joint pain.).   metFORMIN (GLUCOPHAGE) 1000 MG tablet Take 1 tablet (1,000 mg total) by mouth 2 (two) times daily with a meal.   methocarbamol (ROBAXIN) 500 MG tablet Take 500 mg by mouth 3 (three) times daily as needed for muscle spasms.    Multiple Vitamin (MULTIVITAMIN WITH MINERALS) TABS tablet Take 1 tablet by mouth daily.   ondansetron (ZOFRAN ODT) 4 MG disintegrating tablet Take 1 tablet (4 mg total) by mouth every 8 (eight) hours as needed for nausea or vomiting.   pantoprazole (PROTONIX) 40 MG tablet Take 1 tablet (40 mg total) by mouth 2 (two) times daily. (Patient taking differently: Take 40 mg by mouth daily before breakfast. )   Probiotic CHEW Chew 1-2 capsules by mouth daily. *Hold for diarrhea* (Gummy)   Propylene Glycol-Glycerin (SOOTHE) 0.6-0.6 % SOLN Place 1 drop into both eyes 2 (two) times daily.   rizatriptan (MAXALT-MLT) 10 MG disintegrating tablet Take 10 mg by mouth as needed for migraine. May repeat in 2 hours if needed   temazepam (RESTORIL) 30 MG capsule Take 1 capsule (30 mg total) by mouth at bedtime as needed for sleep.   traMADol (ULTRAM) 50 MG tablet One tablet every six hours as needed for pain.   [DISCONTINUED] insulin  glargine (LANTUS SOLOSTAR) 100 UNIT/ML Solostar Pen Inject 20 Units into the skin at bedtime. (Patient taking differently: Inject 30 Units into the skin at bedtime. )   [DISCONTINUED] metFORMIN (GLUCOPHAGE) 1000 MG tablet Take 1,000 mg by mouth daily with breakfast.   No facility-administered encounter medications on file as of 04/22/2020.    ALLERGIES: Allergies  Allergen Reactions   Bactrim Itching, Nausea And Vomiting and Other (See Comments)    Redness    VACCINATION STATUS: Immunization History  Administered Date(s) Administered   Moderna Sars-Covid-2 Vaccination 07/21/2019, 08/22/2019   Pneumococcal Polysaccharide-23 01/09/2020    Diabetes She presents for her follow-up diabetic visit. She has type 2 diabetes mellitus. Onset time: She was diagnosed at approximate age of 34 years. Her disease course has been improving (She underwent sleeve gastrectomy on January 08, 2020, and she has lost 14 pounds since her surgery.). There are no hypoglycemic associated symptoms. Pertinent negatives for hypoglycemia include no confusion, headaches, pallor or seizures. Associated symptoms include fatigue. Pertinent negatives for diabetes include no chest pain, no polydipsia, no polyphagia and no polyuria. There are no hypoglycemic complications. Symptoms are improving. Diabetic complications include heart disease. Risk factors for coronary artery disease include dyslipidemia, family history, diabetes mellitus, hypertension, obesity, sedentary lifestyle and post-menopausal. Her weight is decreasing steadily (She has lost 45% overall since her sleeve gastrectomy.). She is following a generally unhealthy diet. When asked about meal planning, she reported none. She has not had a previous visit with a dietitian. She never participates in exercise. Her home blood glucose trend is decreasing steadily. Her breakfast blood glucose range is generally 130-140 mg/dl. Her lunch blood glucose range is generally  130-140 mg/dl. Her bedtime blood glucose range is generally 130-140 mg/dl. Her overall blood glucose range is 130-140  mg/dl. (She presents with her CGM device showing 96% time range, 4% above range, no hypoglycemia.  Her point-of-care A1c 6.5%, generally improving.  ) An ACE inhibitor/angiotensin II receptor blocker is being taken. Eye exam is current.  Hypertension This is a chronic problem. The current episode started more than 1 year ago. The problem is uncontrolled. Pertinent negatives include no chest pain, headaches, palpitations or shortness of breath. Risk factors for coronary artery disease include diabetes mellitus, obesity, sedentary lifestyle and post-menopausal state. Past treatments include ACE inhibitors.    Review of systems: Limited as above. Objective:    BP (!) 150/81    Pulse 69    Ht 5\' 6"  (1.676 m)    Wt 263 lb (119.3 kg)    BMI 42.45 kg/m   Wt Readings from Last 3 Encounters:  04/22/20 263 lb (119.3 kg)  03/06/20 276 lb 3.2 oz (125.3 kg)  03/04/20 275 lb 2.2 oz (124.8 kg)     Physical Exam- Limited  Constitutional:  Body mass index is 42.45 kg/m. , not in acute distress, normal state of mind Eyes:  EOMI, no exophthalmos Neck: Supple Thyroid: No gross goiter Respiratory: Adequate breathing efforts Musculoskeletal: no gross deformities, strength intact in all four extremities, no gross restriction of joint movements Skin:  no rashes, no hyperemia Neurological: no tremor with outstretched hands,    CMP     Component Value Date/Time   NA 140 01/01/2020 1356   NA 136 12/06/2019 0808   K 4.5 01/01/2020 1356   CL 103 01/01/2020 1356   CO2 28 01/01/2020 1356   GLUCOSE 116 (H) 01/01/2020 1356   BUN 19 01/01/2020 1356   BUN 15 12/06/2019 0808   CREATININE 0.67 01/01/2020 1356   CREATININE 0.74 03/23/2011 0400   CALCIUM 9.1 01/01/2020 1356   PROT 7.7 01/01/2020 1356   PROT 7.4 12/06/2019 0808   ALBUMIN 4.1 01/01/2020 1356   ALBUMIN 4.5 12/06/2019 0808    AST 52 (H) 01/01/2020 1356   ALT 51 (H) 01/01/2020 1356   ALKPHOS 78 01/01/2020 1356   BILITOT 0.6 01/01/2020 1356   BILITOT 0.4 12/06/2019 0808   GFRNONAA >60 01/01/2020 1356   GFRAA >60 01/01/2020 1356   Recent Results (from the past 2160 hour(s))  HgB A1c     Status: None   Collection Time: 04/22/20  4:11 PM  Result Value Ref Range   Hemoglobin A1C     HbA1c POC (<> result, manual entry)     HbA1c, POC (prediabetic range)     HbA1c, POC (controlled diabetic range) 6.5 0.0 - 7.0 %     Assessment & Plan:   1. Uncontrolled type 2 diabetes mellitus with hyperglycemia ([place])  - [name] has currently uncontrolled symptomatic type 2 DM since  59 years of age.  She presents with her CGM device showing 96% time range, 4% above range, no hypoglycemia.  Her point-of-care A1c 6.5%, generally improving.  - Recent labs reviewed. - I had a long discussion with her about the progressive nature of diabetes and the pathology behind its complications. -her diabetes is complicated by morbid obesity, sedentary life, hypertension and she remains at a high risk for more acute and chronic complications which include CAD, CVA, CKD, retinopathy, and neuropathy. These are all discussed in detail with her. -She is currently being prepared for sleeve gastrectomy at the end of this month.  - I have counseled her on diet  and weight management  by adopting a carbohydrate restricted/protein  rich diet. Patient is encouraged to switch to  unprocessed or minimally processed     complex starch and increased protein intake (animal or plant source), fruits, and vegetables. -  she is advised to stick to a routine mealtimes to eat 3 meals  a day and avoid unnecessary snacks ( to snack only to correct hypoglycemia).   - she acknowledges that there is a room for improvement in her food and drink choices. - Suggestion is made for her to avoid simple carbohydrates  from her diet including Cakes, Sweet Desserts,  Ice Cream, Soda (diet and regular), Sweet Tea, Candies, Chips, Cookies, Store Bought Juices, Alcohol in Excess of  1-2 drinks a day, Artificial Sweeteners,  Coffee Creamer, and "Sugar-free" Products, Lemonade. This will help patient to have more stable blood glucose profile and potentially avoid unintended weight gain.  - I have approached her with the following individualized plan to manage  her diabetes and patient agrees:   -She is responding to the gastrectomy already with near target glycemic profile.  She has lost 45 pounds overall.  She is advised to continue Lantus 20 units nightly, continue Metformin 1000 mg p.o. twice daily.  Continue to hold [place] for now.    - Specific targets for  A1c;  LDL, HDL,  and Triglycerides were discussed with the patient.   2) Blood Pressure /Hypertension: Her blood pressure is not controlled to target.   she is advised to continue her current medications including lisinopril 5 mg p.o. daily with breakfast , lisinopril will be increased to  10 mg for next refill.  3) Lipids/Hyperlipidemia:     -Her recent lipid panel showed uncontrolled LDL of 91.  She will be considered for fasting lipid panel before her next visit.  4)  Weight/Diet: Her BMI is 42.4 , lost 45 pounds since sleeve gastrectomy.  She is expected to continue to lose  More weight.    5) Chronic Care/Health Maintenance:  -she  is on ACEI/ARB and  is encouraged to initiate and continue to follow up with Ophthalmology, Dentist,  Podiatrist at least yearly or according to recommendations, and advised to  stay away from smoking. I have recommended yearly flu vaccine and pneumonia vaccine at least every 5 years; moderate intensity exercise for up to 150 minutes weekly; and  sleep for at least 7 hours a day.  POCT ABI Results 04/22/20  Her ABIs normal today Right ABI: 1.07      left ABI: 1.07  Right leg systolic / diastolic: 004/59 mmHg Left leg systolic / diastolic: 977/41 mmHg  Arm systolic  / diastolic: 423/95 mmHG This study will be repeated in 5 years-December 2026, or sooner if needed.  - she is  advised to maintain close follow up with Sharilyn Sites, MD for primary care needs, as well as her other providers for optimal and coordinated care. - Time spent on this patient care encounter:  40 min, of which > 50% was spent in  counseling and the rest reviewing her blood glucose logs , discussing her hypoglycemia and hyperglycemia episodes, reviewing her current and  previous labs / studies  ( including abstraction from other facilities) and medications  doses and developing a  long term treatment plan and documenting her care.   Please refer to Patient Instructions for Blood Glucose Monitoring and Insulin/Medications Dosing Guide"  in media tab for additional information. Please  also refer to " Patient Self Inventory" in the Media  tab for reviewed elements of pertinent  patient history.  Francesca Jewett participated in the discussions, expressed understanding, and voiced agreement with the above plans.  All questions were answered to her satisfaction. she is encouraged to contact clinic should she have any questions or concerns prior to her return visit.   Follow up plan: - Return in about 4 months (around 08/21/2020) for F/U with Pre-visit Labs, Meter, Logs, A1c here.Glade Lloyd, MD [facility] [address] [place], [place] [address] Phone: [phone]  Fax: [phone]    04/22/2020, 5:14 PM  This note was partially dictated with voice recognition software. Similar sounding words can be transcribed inadequately or may not  be corrected upon review.

## 2020-05-11 DIAGNOSIS — E1165 Type 2 diabetes mellitus with hyperglycemia: Secondary | ICD-10-CM | POA: Diagnosis not present

## 2020-05-19 ENCOUNTER — Other Ambulatory Visit: Payer: Self-pay

## 2020-05-19 ENCOUNTER — Telehealth (INDEPENDENT_AMBULATORY_CARE_PROVIDER_SITE_OTHER): Payer: Medicare Other | Admitting: Psychiatry

## 2020-05-19 ENCOUNTER — Encounter (HOSPITAL_COMMUNITY): Payer: Self-pay | Admitting: Psychiatry

## 2020-05-19 DIAGNOSIS — F3162 Bipolar disorder, current episode mixed, moderate: Secondary | ICD-10-CM

## 2020-05-19 MED ORDER — ESCITALOPRAM OXALATE 20 MG PO TABS: 20.0000 mg | ORAL_TABLET | Freq: Every day | ORAL | 2 refills | Status: DC

## 2020-05-19 MED ORDER — ARIPIPRAZOLE 10 MG PO TABS: 10.0000 mg | ORAL_TABLET | Freq: Every day | ORAL | 2 refills | Status: DC

## 2020-05-19 MED ORDER — TEMAZEPAM 30 MG PO CAPS: 30.0000 mg | ORAL_CAPSULE | Freq: Every evening | ORAL | 2 refills | Status: DC | PRN

## 2020-05-19 MED ORDER — ALPRAZOLAM 1 MG PO TABS: 1.0000 mg | ORAL_TABLET | Freq: Three times a day (TID) | ORAL | 1 refills | Status: DC | PRN

## 2020-05-19 NOTE — Progress Notes (Signed)
Virtual Visit via Telephone Note  I connected with [name] on 05/19/20 at  4:00 PM EST by telephone and verified that I am speaking with the correct person using two identifiers.  Location: Patient: home Provider: home   I discussed the limitations, risks, security and privacy concerns of performing an evaluation and management service by telephone and the availability of in person appointments. I also discussed with the patient that there may be a patient responsible charge related to this service. The patient expressed understanding and agreed to proceed    I discussed the assessment and treatment plan with the patient. The patient was provided an opportunity to ask questions and all were answered. The patient agreed with the plan and demonstrated an understanding of the instructions.   The patient was advised to call back or seek an in-person evaluation if the symptoms worsen or if the condition fails to improve as anticipated.  I provided 15 minutes of non-face-to-face time during this encounter.   Levonne Spiller, MD  [facility] MD/PA/NP OP Progress Note  05/19/2020 4:21 PM [name]  MRN:  [id number]  Chief Complaint:  Chief Complaint    Depression; Anxiety; Follow-up     HPI: This patient is a 60 year old married white female who lives with her husband in [place].  She has 2 sons who live outside the home.  She is on disability.  The patient returns for follow-up regarding her depression and anxiety.  She was last seen approximately 5 months ago.  The patient states that she is doing very well.  She had a lap band procedure done in August and she is now lost 50 pounds.  She is very excited about this.  She no longer has to use as much insulin and is hoping to get off of it.  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  Her mood seems to be excellent.  She is sleeping well and her energy is good.   She is still having a lot of knee pain and she hopes that if she loses enough weight she'll be approved for knee surgery Visit Diagnosis:    ICD-10-CM   1. Bipolar 1 disorder, mixed, moderate (HCC)  F31.62     Past Psychiatric History: Long-term outpatient treatment  Past Medical History:  Past Medical History:  Diagnosis Date  . Adenomatous polyp 2009  . Anemia 2003 after surgery needed 1 unit blood  . Anxiety   . Arthritis   . Bipolar 1 disorder ([place])   . Constipation   . Depression   . Diabetes mellitus   . Diabetes mellitus, type II ([place])   . Diverticula of colon 2009  . Generalized headaches   . GERD (gastroesophageal reflux disease)   . History of hiatal hernia    small  . Hypertension   . Migraine   . Neuropathy    both feet  . Obesity   . Pelvic floor dysfunction    abnormal anorectal manometry at [facility] in 2009  . Plantar fasciitis both feet  . Sleep apnea    cpap setting of 3.5    Past Surgical History:  Procedure Laterality Date  . ABDOMINAL HYSTERECTOMY     compete  . CATARACT EXTRACTION W/PHACO Right 02/15/2017   Procedure: CATARACT EXTRACTION PHACO AND INTRAOCULAR LENS PLACEMENT (IOC);  Surgeon: Rutherford Guys, MD;  Location: [facility];  Service: Ophthalmology;  Laterality: Right;  CDE: 4.18  .  CATARACT EXTRACTION W/PHACO Left 03/01/2017   Procedure: CATARACT EXTRACTION PHACO AND INTRAOCULAR LENS PLACEMENT (IOC);  Surgeon: Rutherford Guys, MD;  Location: [facility];  Service: Ophthalmology;  Laterality: Left;  CDE: 2.56  . COLON RESECTION    03/30/2002   with end-colostomy and Hartmann's pouch  . COLON SURGERY     complicated diverticulitis requiring sigmoid resection with colostomy and subsequent takedown  . COLONOSCOPY  12/11/2007   Dr. Gala Romney- marginal prep, normal rectum pancolonic diverticula, adenomatous polyp  . COLONOSCOPY  08/28/2003      Wide open colonic anastomosis/Scattered diverticula noted throughout colon/ Small external hemorrhoids  . COLONOSCOPY   04/2012   [facility]: hyperplastic polyps, diverticulosis, ileocolonic anastomosis.  . COLONOSCOPY WITH PROPOFOL N/A 06/07/2016   Procedure: COLONOSCOPY WITH PROPOFOL;  Surgeon: Daneil Dolin, MD;  Location: [facility];  Service: Endoscopy;  Laterality: N/A;  7:30 am  . COLOSTOMY CLOSURE  07/10/2002  . ESOPHAGOGASTRODUODENOSCOPY N/A 01/08/2020   Procedure: UPPER GASTROINTESTINAL ENDOSCOPY;  Surgeon: Alphonsa Overall, MD;  Location: [facility];  Service: General;  Laterality: N/A;  . ESOPHAGOGASTRODUODENOSCOPY (EGD) WITH PROPOFOL N/A 08/23/2016   Procedure: ESOPHAGOGASTRODUODENOSCOPY (EGD) WITH PROPOFOL;  Surgeon: Alphonsa Overall, MD;  Location: [facility];  Service: General;  Laterality: N/A;  . FLEXIBLE SIGMOIDOSCOPY  01/20/2012   [facility]: incomplete/attempted colonoscopy. Inadequate prep precluded examination  . HEEL SPUR SURGERY Left 09/11/2013   both have been done  . KNEE ARTHROSCOPY  02/04/2004    left knee/partial medial meniscectomy.  Marland Kitchen KNEE SURGERY     3 arthroscopic  2 on left 1 on right  . LAPAROSCOPIC GASTRIC BANDING  2009  . LAPAROSCOPIC GASTRIC SLEEVE RESECTION N/A 01/08/2020   Procedure: LAPAROSCOPIC SLEEVE GASTRECTOMY;  Surgeon: Alphonsa Overall, MD;  Location: [facility];  Service: General;  Laterality: N/A;  . LAPAROSCOPIC SALPINGOOPHERECTOMY  03/30/2002  . TUBAL LIGATION      Family Psychiatric History: see below  Family History:  Family History  Problem Relation Age of Onset  . Ovarian cancer Mother   . Heart disease Mother   . Anxiety disorder Mother   . Cirrhosis Father        deceased age 35  . Alcohol abuse Father   . Liver cancer Cousin        age 79, deceased  . Drug abuse Cousin   . ADD / ADHD Son   . Anxiety disorder Sister   . Dementia Maternal Grandfather   . Colon cancer Other        aunt, deceased age 55  . Breast cancer Other        aunt, deceased age 31  . Bipolar disorder Neg Hx   . Depression Neg Hx   . OCD Neg Hx   . Paranoid behavior Neg Hx   .  Schizophrenia Neg Hx   . Seizures Neg Hx   . Sexual abuse Neg Hx   . Physical abuse Neg Hx     Social History:  Social History   Socioeconomic History  . Marital status: Married    Spouse name: Not on file  . Number of children: 2  . Years of education: college  . Highest education level: Not on file  Occupational History  . 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 Smoking status: Never Smoker  . Smokeless tobacco: Never Used  Vaping Use  . Vaping Use: Never used  Substance and Sexual Activity  . Alcohol use:  Not Currently    Comment: occasionally wine  . Drug use: No  . Sexual activity: Not Currently    Birth control/protection: Surgical  Other Topics Concern  . Not on file  Social History Narrative  . Not on file   Social Determinants of Health   Financial Resource Strain: Not on file  Food Insecurity: Not on file  Transportation Needs: Not on file  Physical Activity: Not on file  Stress: Not on file  Social Connections: Not on file    Allergies:  Allergies  Allergen Reactions  . Bactrim Itching, Nausea And Vomiting and Other (See Comments)    Redness    Metabolic Disorder Labs: Lab Results  Component Value Date   HGBA1C 6.5 04/22/2020   MPG 232 01/01/2020   No results found for: PROLACTIN Lab Results  Component Value Date   CHOL 196 12/06/2019   TRIG 144 12/06/2019   HDL 52 12/06/2019   LDLCALC 119 (H) 12/06/2019   LDLCALC 25 12/06/2019   Lab Results  Component Value Date   TSH 1.560 12/06/2019   TSH 1.56 12/06/2019    Therapeutic Level Labs: No results found for: LITHIUM No results found for: VALPROATE No components found for:  CBMZ  Current Medications: Current Outpatient Medications  Medication Sig Dispense Refill  . ALPRAZolam (XANAX) 1 MG tablet Take 1 tablet (1 mg total) by mouth 3 (three) times daily as needed for anxiety. 270 tablet 1  . ARIPiprazole (ABILIFY) 10 MG tablet  Take 1 tablet (10 mg total) by mouth at bedtime. 90 tablet 2  . ascorbic acid (VITAMIN C) 500 MG tablet Take 500 mg by mouth daily.    . Blood Glucose Monitoring Suppl (FREESTYLE LITE) DEVI Use to measure glucose 4 times a day 1 each 0  . Continuous Blood Gluc Receiver (FREESTYLE LIBRE 2 READER) DEVI As directed 1 each 0  . Continuous Blood Gluc Sensor (FREESTYLE LIBRE 2 SENSOR) MISC 1 Piece by Does not apply route every 14 (fourteen) days. 2 each 3  . cycloSPORINE (RESTASIS) 0.05 % ophthalmic emulsion Place 2 drops into both eyes 2 (two) times daily.     Marland Kitchen escitalopram (LEXAPRO) 20 MG tablet Take 1 tablet (20 mg total) by mouth daily. 90 tablet 2  . fluticasone (FLONASE) 50 MCG/ACT nasal spray Place 2 sprays into the nose daily as needed for allergies.     Marland Kitchen gabapentin (NEURONTIN) 300 MG capsule Take 300 mg by mouth 2 (two) times daily.    Marland Kitchen glucose blood test strip Test 4 times a day, she has freestyle lite meter 150 each 2  . insulin glargine (LANTUS SOLOSTAR) 100 UNIT/ML Solostar Pen Inject 20 Units into the skin at bedtime. 15 mL 1  . Insulin Pen Needle (B-D ULTRAFINE III SHORT PEN) 31G X 8 MM MISC 1 each by Does not apply route as directed. 100 each 3  . lisinopril (ZESTRIL) 5 MG tablet Take 5 mg by mouth daily.    Marland Kitchen loratadine (CLARITIN) 10 MG tablet Take 10 mg by mouth daily.    . meclizine (ANTIVERT) 25 MG tablet Take 25 mg by mouth 2 (two) times daily as needed for dizziness.   2  . Menthol, Topical Analgesic, (ICY HOT EX) Apply 1 application topically 4 (four) times daily as needed (joint pain.).    Marland Kitchen metFORMIN (GLUCOPHAGE) 1000 MG tablet Take 1 tablet (1,000 mg total) by mouth 2 (two) times daily with a meal. 180 tablet 1  . methocarbamol (ROBAXIN) 500 MG  tablet Take 500 mg by mouth 3 (three) times daily as needed for muscle spasms.     . Multiple Vitamin (MULTIVITAMIN WITH MINERALS) TABS tablet Take 1 tablet by mouth daily.    . ondansetron (ZOFRAN ODT) 4 MG disintegrating tablet Take  1 tablet (4 mg total) by mouth every 8 (eight) hours as needed for nausea or vomiting. 10 tablet 0  . pantoprazole (PROTONIX) 40 MG tablet Take 1 tablet (40 mg total) by mouth 2 (two) times daily. (Patient taking differently: Take 40 mg by mouth daily before breakfast. ) 180 tablet 3  . Probiotic CHEW Chew 1-2 capsules by mouth daily. *Hold for diarrhea* (Gummy)    . Propylene Glycol-Glycerin (SOOTHE) 0.6-0.6 % SOLN Place 1 drop into both eyes 2 (two) times daily.    . rizatriptan (MAXALT-MLT) 10 MG disintegrating tablet Take 10 mg by mouth as needed for migraine. May repeat in 2 hours if needed    . temazepam (RESTORIL) 30 MG capsule Take 1 capsule (30 mg total) by mouth at bedtime as needed for sleep. 90 capsule 2  . traMADol (ULTRAM) 50 MG tablet One tablet every six hours as needed for pain. 40 tablet 3   No current facility-administered medications for this visit.     Musculoskeletal: Strength & Muscle Tone: within normal limits Gait & Station: normal Patient leans: N/A  Psychiatric Specialty Exam: Review of Systems  Musculoskeletal: Positive for arthralgias and joint swelling.  All other systems reviewed and are negative.   There were no vitals taken for this visit.There is no height or weight on file to calculate BMI.  General Appearance: NA  Eye Contact:NA  Speech:  Clear and Coherent  Volume:  Normal  Mood:  Euthymic  Affect:  Congruent and Full Range  Thought Process:  Goal Directed  Orientation:  Full (Time, Place, and Person)  Thought Content: WDL   Suicidal Thoughts:  No  Homicidal Thoughts:  No  Memory:  Immediate;   Good Recent;   Good Remote;   Good  Judgement:  Good  Insight:  Good  Psychomotor Activity:  Normal  Concentration:  Concentration: Good and Attention Span: Good  Recall:  Good  Fund of Knowledge: Good  Language: Good  Akathisia:  No  Handed:  Right  AIMS (if indicated): not done  Assets:  Communication Skills Desire for  Improvement Resilience Social Support Talents/Skills  ADL's:  Intact  Cognition: WNL  Sleep:  Good   Screenings: PHQ2-9   Flowsheet Row Nutrition from 04/20/2019 in [facility] Visit from 07/13/2018 in [facility]  PHQ-2 Total Score 0 0       Assessment and Plan: This patient is a 60 year old female with a history of bipolar disorder and PTSD she states that she is doing very well and is quite excited about her weight loss.  She will continue Xanax 1 mg 3 times daily for anxiety, Lexapro 20 mg daily for depression Abilify 10 mg nightly for mood stabilization and Restoril 30 mg at bedtime for sleep.  She'll return to see me in 3 months   Levonne Spiller, MD 05/19/2020, 4:21 PM

## 2020-06-11 DIAGNOSIS — E1165 Type 2 diabetes mellitus with hyperglycemia: Secondary | ICD-10-CM | POA: Diagnosis not present

## 2020-06-17 ENCOUNTER — Ambulatory Visit: Payer: Medicare Other

## 2020-06-19 LAB — HM DIABETES EYE EXAM

## 2020-07-01 DIAGNOSIS — G43909 Migraine, unspecified, not intractable, without status migrainosus: Secondary | ICD-10-CM | POA: Diagnosis not present

## 2020-07-01 DIAGNOSIS — K219 Gastro-esophageal reflux disease without esophagitis: Secondary | ICD-10-CM | POA: Diagnosis not present

## 2020-07-01 DIAGNOSIS — E119 Type 2 diabetes mellitus without complications: Secondary | ICD-10-CM | POA: Diagnosis not present

## 2020-07-01 DIAGNOSIS — Z903 Acquired absence of stomach [part of]: Secondary | ICD-10-CM | POA: Diagnosis not present

## 2020-07-03 DIAGNOSIS — R69 Illness, unspecified: Secondary | ICD-10-CM | POA: Diagnosis not present

## 2020-07-03 DIAGNOSIS — K912 Postsurgical malabsorption, not elsewhere classified: Secondary | ICD-10-CM | POA: Diagnosis not present

## 2020-07-03 DIAGNOSIS — M170 Bilateral primary osteoarthritis of knee: Secondary | ICD-10-CM | POA: Diagnosis not present

## 2020-07-24 DIAGNOSIS — G4730 Sleep apnea, unspecified: Secondary | ICD-10-CM | POA: Diagnosis not present

## 2020-07-24 DIAGNOSIS — G4733 Obstructive sleep apnea (adult) (pediatric): Secondary | ICD-10-CM | POA: Diagnosis not present

## 2020-07-30 DIAGNOSIS — E1165 Type 2 diabetes mellitus with hyperglycemia: Secondary | ICD-10-CM | POA: Diagnosis not present

## 2020-08-11 ENCOUNTER — Telehealth: Payer: Self-pay | Admitting: "Endocrinology

## 2020-08-11 NOTE — Telephone Encounter (Signed)
Pt called after hour line to see which lab to use and if she needed to be fasting. Provided the pt with that information

## 2020-08-13 DIAGNOSIS — E7849 Other hyperlipidemia: Secondary | ICD-10-CM | POA: Diagnosis not present

## 2020-08-13 DIAGNOSIS — E559 Vitamin D deficiency, unspecified: Secondary | ICD-10-CM | POA: Diagnosis not present

## 2020-08-13 DIAGNOSIS — E782 Mixed hyperlipidemia: Secondary | ICD-10-CM | POA: Diagnosis not present

## 2020-08-13 DIAGNOSIS — E1165 Type 2 diabetes mellitus with hyperglycemia: Secondary | ICD-10-CM | POA: Diagnosis not present

## 2020-08-14 ENCOUNTER — Encounter (HOSPITAL_COMMUNITY): Payer: Self-pay | Admitting: Psychiatry

## 2020-08-14 ENCOUNTER — Other Ambulatory Visit: Payer: Self-pay

## 2020-08-14 ENCOUNTER — Telehealth (INDEPENDENT_AMBULATORY_CARE_PROVIDER_SITE_OTHER): Payer: Medicare Other | Admitting: Psychiatry

## 2020-08-14 DIAGNOSIS — F3162 Bipolar disorder, current episode mixed, moderate: Secondary | ICD-10-CM | POA: Diagnosis not present

## 2020-08-14 LAB — T4, FREE: Free T4: 1.2 ng/dL (ref 0.82–1.77)

## 2020-08-14 LAB — COMPREHENSIVE METABOLIC PANEL
ALT: 15 IU/L (ref 0–32)
AST: 20 IU/L (ref 0–40)
Albumin/Globulin Ratio: 1.7 (ref 1.2–2.2)
Albumin: 4.3 g/dL (ref 3.8–4.9)
Alkaline Phosphatase: 86 IU/L (ref 44–121)
BUN/Creatinine Ratio: 31 — ABNORMAL HIGH (ref 12–28)
BUN: 19 mg/dL (ref 8–27)
Bilirubin Total: 0.3 mg/dL (ref 0.0–1.2)
CO2: 22 mmol/L (ref 20–29)
Calcium: 9.4 mg/dL (ref 8.7–10.3)
Chloride: 102 mmol/L (ref 96–106)
Creatinine, Ser: 0.61 mg/dL (ref 0.57–1.00)
Globulin, Total: 2.5 g/dL (ref 1.5–4.5)
Glucose: 97 mg/dL (ref 65–99)
Potassium: 4.4 mmol/L (ref 3.5–5.2)
Sodium: 145 mmol/L — ABNORMAL HIGH (ref 134–144)
Total Protein: 6.8 g/dL (ref 6.0–8.5)
eGFR: 102 mL/min/{1.73_m2} (ref >59)

## 2020-08-14 LAB — LIPID PANEL
Chol/HDL Ratio: 3.4 ratio (ref 0.0–4.4)
Cholesterol, Total: 175 mg/dL (ref 100–199)
HDL: 52 mg/dL (ref >39)
LDL Chol Calc (NIH): 101 mg/dL — ABNORMAL HIGH (ref 0–99)
Triglycerides: 122 mg/dL (ref 0–149)
VLDL Cholesterol Cal: 22 mg/dL (ref 5–40)

## 2020-08-14 LAB — VITAMIN D 25 HYDROXY (VIT D DEFICIENCY, FRACTURES): Vit D, 25-Hydroxy: 32.3 ng/mL (ref 30.0–100.0)

## 2020-08-14 LAB — TSH: TSH: 1.31 u[IU]/mL (ref 0.450–4.500)

## 2020-08-14 MED ORDER — ALPRAZOLAM 1 MG PO TABS: 1.0000 mg | ORAL_TABLET | Freq: Three times a day (TID) | ORAL | 1 refills | Status: DC | PRN

## 2020-08-14 MED ORDER — ESCITALOPRAM OXALATE 20 MG PO TABS: 20.0000 mg | ORAL_TABLET | Freq: Every day | ORAL | 2 refills | Status: DC

## 2020-08-14 MED ORDER — ARIPIPRAZOLE 10 MG PO TABS: 10.0000 mg | ORAL_TABLET | Freq: Every day | ORAL | 2 refills | Status: DC

## 2020-08-14 MED ORDER — TEMAZEPAM 30 MG PO CAPS: 30.0000 mg | ORAL_CAPSULE | Freq: Every evening | ORAL | 2 refills | Status: DC | PRN

## 2020-08-14 NOTE — Progress Notes (Signed)
Virtual Visit via Telephone Note  I connected with DARIA MCMEEKIN on 08/14/20 at  1:00 PM EDT by telephone and verified that I am speaking with the correct person using two identifiers.  Location: Patient: home Provider: home   I discussed the limitations, risks, security and privacy concerns of performing an evaluation and management service by telephone and the availability of in person appointments. I also discussed with the patient that there may be a patient responsible charge related to this service. The patient expressed understanding and agreed to proceed.    I discussed the assessment and treatment plan with the patient. The patient was provided an opportunity to ask questions and all were answered. The patient agreed with the plan and demonstrated an understanding of the instructions.   The patient was advised to call back or seek an in-person evaluation if the symptoms worsen or if the condition fails to improve as anticipated.  I provided 15 minutes of non-face-to-face time during this encounter.   Levonne Spiller, MD  [facility] MD/PA/NP OP Progress Note  08/14/2020 1:18 PM KELLEEN STOLZE  MRN:  [id number]  Chief Complaint:  Chief Complaint    Anxiety; Depression; Follow-up     HPI: This patient is a 60 year old married white female who lives with her husband in [place].  She has 2 sons who live outside the home.  She is on disability.  Patient returns for follow-up regarding depression and anxiety.  She was last seen 3 months ago.  The patient continues to report that she is doing well.  She and her husband are going to start marriage counseling through the [facility].  She has lost 60 pounds since her bariatric surgery in August.  All of her laboratories have improved including her A1c.  Her mood is excellent and her energy and sleep are good.  She denies any thoughts of self-harm.  She is very much enjoying doing her crafts. Visit Diagnosis:    ICD-10-CM   1. Bipolar 1  disorder, mixed, moderate (HCC)  F31.62     Past Psychiatric History: Long-term outpatient treatment  Past Medical History:  Past Medical History:  Diagnosis Date  . Adenomatous polyp 2009  . Anemia 2003 after surgery needed 1 unit blood  . Anxiety   . Arthritis   . Bipolar 1 disorder ([place])   . Constipation   . Depression   . Diabetes mellitus   . Diabetes mellitus, type II ([place])   . Diverticula of colon 2009  . Generalized headaches   . GERD (gastroesophageal reflux disease)   . History of hiatal hernia    small  . Hypertension   . Migraine   . Neuropathy    both feet  . Obesity   . Pelvic floor dysfunction    abnormal anorectal manometry at [facility] in 2009  . Plantar fasciitis both feet  . Sleep apnea    cpap setting of 3.5    Past Surgical History:  Procedure Laterality Date  . ABDOMINAL HYSTERECTOMY     compete  . CATARACT EXTRACTION W/PHACO Right 02/15/2017   Procedure: CATARACT EXTRACTION PHACO AND INTRAOCULAR LENS PLACEMENT (IOC);  Surgeon: Rutherford Guys, MD;  Location: [facility];  Service: Ophthalmology;  Laterality: Right;  CDE: 4.18  . CATARACT EXTRACTION W/PHACO Left 03/01/2017   Procedure: CATARACT EXTRACTION PHACO AND INTRAOCULAR LENS PLACEMENT (IOC);  Surgeon: Rutherford Guys, MD;  Location: [facility];  Service: Ophthalmology;  Laterality: Left;  CDE: 2.56  . COLON RESECTION    03/30/2002  with end-colostomy and Hartmann's pouch  . COLON SURGERY     complicated diverticulitis requiring sigmoid resection with colostomy and subsequent takedown  . COLONOSCOPY  12/11/2007   Dr. Gala Romney- marginal prep, normal rectum pancolonic diverticula, adenomatous polyp  . COLONOSCOPY  08/28/2003      Wide open colonic anastomosis/Scattered diverticula noted throughout colon/ Small external hemorrhoids  . COLONOSCOPY  04/2012   [facility]: hyperplastic polyps, diverticulosis, ileocolonic anastomosis.  . COLONOSCOPY WITH PROPOFOL N/A 06/07/2016   Procedure: COLONOSCOPY WITH PROPOFOL;  Surgeon:  Daneil Dolin, MD;  Location: [facility];  Service: Endoscopy;  Laterality: N/A;  7:30 am  . COLOSTOMY CLOSURE  07/10/2002  . ESOPHAGOGASTRODUODENOSCOPY N/A 01/08/2020   Procedure: UPPER GASTROINTESTINAL ENDOSCOPY;  Surgeon: Alphonsa Overall, MD;  Location: [facility];  Service: General;  Laterality: N/A;  . ESOPHAGOGASTRODUODENOSCOPY (EGD) WITH PROPOFOL N/A 08/23/2016   Procedure: ESOPHAGOGASTRODUODENOSCOPY (EGD) WITH PROPOFOL;  Surgeon: Alphonsa Overall, MD;  Location: [facility];  Service: General;  Laterality: N/A;  . FLEXIBLE SIGMOIDOSCOPY  01/20/2012   [facility]: incomplete/attempted colonoscopy. Inadequate prep precluded examination  . HEEL SPUR SURGERY Left 09/11/2013   both have been done  . KNEE ARTHROSCOPY  02/04/2004    left knee/partial medial meniscectomy.  Marland Kitchen KNEE SURGERY     3 arthroscopic  2 on left 1 on right  . LAPAROSCOPIC GASTRIC BANDING  2009  . LAPAROSCOPIC GASTRIC SLEEVE RESECTION N/A 01/08/2020   Procedure: LAPAROSCOPIC SLEEVE GASTRECTOMY;  Surgeon: Alphonsa Overall, MD;  Location: [facility];  Service: General;  Laterality: N/A;  . LAPAROSCOPIC SALPINGOOPHERECTOMY  03/30/2002  . TUBAL LIGATION      Family Psychiatric History: see below  Family History:  Family History  Problem Relation Age of Onset  . Ovarian cancer Mother   . Heart disease Mother   . Anxiety disorder Mother   . Cirrhosis Father        deceased age 33  . Alcohol abuse Father   . Liver cancer Cousin        age 85, deceased  . Drug abuse Cousin   . ADD / ADHD Son   . Anxiety disorder Sister   . Dementia Maternal Grandfather   . Colon cancer Other        aunt, deceased age 46  . Breast cancer Other        aunt, deceased age 43  . Bipolar disorder Neg Hx   . Depression Neg Hx   . OCD Neg Hx   . Paranoid behavior Neg Hx   . Schizophrenia Neg Hx   . Seizures Neg Hx   . Sexual abuse Neg Hx   . Physical abuse Neg Hx     Social History:  Social History   Socioeconomic History  . Marital status:  Married    Spouse name: Not on file  . Number of children: 2  . Years of education: college  . Highest education level: Not on file  Occupational History  . 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 Smoking status: Never Smoker  . Smokeless tobacco: Never Used  Vaping Use  . Vaping Use: Never used  Substance and Sexual Activity  . Alcohol use: Not Currently    Comment: occasionally wine  . Drug use: No  . Sexual activity: Not Currently    Birth control/protection: Surgical  Other Topics Concern  . Not on file  Social History Narrative  . Not on file  Social Determinants of Health   Financial Resource Strain: Not on file  Food Insecurity: Not on file  Transportation Needs: Not on file  Physical Activity: Not on file  Stress: Not on file  Social Connections: Not on file    Allergies:  Allergies  Allergen Reactions  . Bactrim Itching, Nausea And Vomiting and Other (See Comments)    Redness    Metabolic Disorder Labs: Lab Results  Component Value Date   HGBA1C 6.5 04/22/2020   MPG 232 01/01/2020   No results found for: PROLACTIN Lab Results  Component Value Date   CHOL 175 08/13/2020   TRIG 122 08/13/2020   HDL 52 08/13/2020   CHOLHDL 3.4 08/13/2020   LDLCALC 101 (H) 08/13/2020   LDLCALC 119 (H) 12/06/2019   Lab Results  Component Value Date   TSH 1.310 08/13/2020   TSH 1.560 12/06/2019    Therapeutic Level Labs: No results found for: LITHIUM No results found for: VALPROATE No components found for:  CBMZ  Current Medications: Current Outpatient Medications  Medication Sig Dispense Refill  . ALPRAZolam (XANAX) 1 MG tablet Take 1 tablet (1 mg total) by mouth 3 (three) times daily as needed for anxiety. 270 tablet 1  . ARIPiprazole (ABILIFY) 10 MG tablet Take 1 tablet (10 mg total) by mouth at bedtime. 90 tablet 2  . ascorbic acid (VITAMIN C) 500 MG tablet Take 500 mg by mouth daily.    . Blood  Glucose Monitoring Suppl (FREESTYLE LITE) DEVI Use to measure glucose 4 times a day 1 each 0  . Continuous Blood Gluc Receiver (FREESTYLE LIBRE 2 READER) DEVI As directed 1 each 0  . Continuous Blood Gluc Sensor (FREESTYLE LIBRE 2 SENSOR) MISC 1 Piece by Does not apply route every 14 (fourteen) days. 2 each 3  . cycloSPORINE (RESTASIS) 0.05 % ophthalmic emulsion Place 2 drops into both eyes 2 (two) times daily.     Marland Kitchen escitalopram (LEXAPRO) 20 MG tablet Take 1 tablet (20 mg total) by mouth daily. 90 tablet 2  . fluticasone (FLONASE) 50 MCG/ACT nasal spray Place 2 sprays into the nose daily as needed for allergies.     Marland Kitchen gabapentin (NEURONTIN) 300 MG capsule Take 300 mg by mouth 2 (two) times daily.    Marland Kitchen glucose blood test strip Test 4 times a day, she has freestyle lite meter 150 each 2  . insulin glargine (LANTUS SOLOSTAR) 100 UNIT/ML Solostar Pen Inject 20 Units into the skin at bedtime. 15 mL 1  . Insulin Pen Needle (B-D ULTRAFINE III SHORT PEN) 31G X 8 MM MISC 1 each by Does not apply route as directed. 100 each 3  . lisinopril (ZESTRIL) 5 MG tablet Take 5 mg by mouth daily.    Marland Kitchen loratadine (CLARITIN) 10 MG tablet Take 10 mg by mouth daily.    . meclizine (ANTIVERT) 25 MG tablet Take 25 mg by mouth 2 (two) times daily as needed for dizziness.   2  . Menthol, Topical Analgesic, (ICY HOT EX) Apply 1 application topically 4 (four) times daily as needed (joint pain.).    Marland Kitchen metFORMIN (GLUCOPHAGE) 1000 MG tablet Take 1 tablet (1,000 mg total) by mouth 2 (two) times daily with a meal. 180 tablet 1  . methocarbamol (ROBAXIN) 500 MG tablet Take 500 mg by mouth 3 (three) times daily as needed for muscle spasms.     . Multiple Vitamin (MULTIVITAMIN WITH MINERALS) TABS tablet Take 1 tablet by mouth daily.    . ondansetron [facility]  ODT) 4 MG disintegrating tablet Take 1 tablet (4 mg total) by mouth every 8 (eight) hours as needed for nausea or vomiting. 10 tablet 0  . pantoprazole (PROTONIX) 40 MG tablet Take  1 tablet (40 mg total) by mouth 2 (two) times daily. (Patient taking differently: Take 40 mg by mouth daily before breakfast. ) 180 tablet 3  . Probiotic CHEW Chew 1-2 capsules by mouth daily. *Hold for diarrhea* (Gummy)    . Propylene Glycol-Glycerin (SOOTHE) 0.6-0.6 % SOLN Place 1 drop into both eyes 2 (two) times daily.    . rizatriptan (MAXALT-MLT) 10 MG disintegrating tablet Take 10 mg by mouth as needed for migraine. May repeat in 2 hours if needed    . temazepam (RESTORIL) 30 MG capsule Take 1 capsule (30 mg total) by mouth at bedtime as needed for sleep. 90 capsule 2  . traMADol (ULTRAM) 50 MG tablet One tablet every six hours as needed for pain. 40 tablet 3   No current facility-administered medications for this visit.     Musculoskeletal: Strength & Muscle Tone: within normal limits Gait & Station: normal Patient leans: N/A  Psychiatric Specialty Exam: Review of Systems  Musculoskeletal: Positive for arthralgias.  All other systems reviewed and are negative.   There were no vitals taken for this visit.There is no height or weight on file to calculate BMI.  General Appearance: NA  Eye Contact:  NA  Speech:  Clear and Coherent  Volume:  Normal  Mood:  Euthymic  Affect:  NA  Thought Process:  Goal Directed  Orientation:  Full (Time, Place, and Person)  Thought Content: WDL   Suicidal Thoughts:  No  Homicidal Thoughts:  No  Memory:  Immediate;   Good Recent;   Good Remote;   Good  Judgement:  Good  Insight:  Good  Psychomotor Activity:  Normal  Concentration:  Concentration: Good and Attention Span: Good  Recall:  Good  Fund of Knowledge: Good  Language: Good  Akathisia:  No  Handed:  Right  AIMS (if indicated): not done  Assets:  Communication Skills Desire for Improvement Resilience Social Support  ADL's:  Intact  Cognition: WNL  Sleep:  Good   Screenings: PHQ2-9   Flowsheet Row Video Visit from 08/14/2020 in [place] Nutrition from [facility]/20/2019 in [facility] Office Visit from 07/13/2018 in [facility]  PHQ-2 Total Score 0 0 0    Flowsheet Row Video Visit from 08/14/2020 in [place] No Risk       Assessment and Plan: Patient is a 60 year old female with a history of bipolar disorder and PTSD.  She continues to do very well.  She will continue Xanax 1 mg 3 times daily for anxiety, Lexapro 20 mg daily for depression, Abilify 10 mg nightly for mood stabilization and Restoril 30 mg nightly for sleep.  She will return to see me in 3 months   Levonne Spiller, MD 08/14/2020, 1:18 PM

## 2020-08-21 ENCOUNTER — Other Ambulatory Visit: Payer: Self-pay

## 2020-08-21 ENCOUNTER — Encounter (HOSPITAL_COMMUNITY): Payer: Self-pay

## 2020-08-21 ENCOUNTER — Encounter: Payer: Self-pay | Admitting: "Endocrinology

## 2020-08-21 ENCOUNTER — Ambulatory Visit (INDEPENDENT_AMBULATORY_CARE_PROVIDER_SITE_OTHER): Payer: Medicare Other | Admitting: "Endocrinology

## 2020-08-21 VITALS — BP 116/68 | HR 72 | Ht 66.0 in | Wt 254.0 lb

## 2020-08-21 DIAGNOSIS — E1165 Type 2 diabetes mellitus with hyperglycemia: Secondary | ICD-10-CM

## 2020-08-21 DIAGNOSIS — E119 Type 2 diabetes mellitus without complications: Secondary | ICD-10-CM | POA: Diagnosis not present

## 2020-08-21 LAB — POCT GLYCOSYLATED HEMOGLOBIN (HGB A1C): HbA1c, POC (controlled diabetic range): 6 % (ref 0.0–7.0)

## 2020-08-21 MED ORDER — TRULICITY 0.75 MG/0.5ML ~~LOC~~ SOAJ: 0.7500 mg | SUBCUTANEOUS | 1 refills | Status: DC

## 2020-08-21 NOTE — Patient Instructions (Signed)
                                     Advice for Weight Management  -For most of us the best way to lose weight is by diet management. Generally speaking, diet management means consuming less calories intentionally which over time brings about progressive weight loss.  This can be achieved more effectively by restricting carbohydrate consumption to the minimum possible.  So, it is critically important to know your numbers: how much calorie you are consuming and how much calorie you need. More importantly, our carbohydrates sources should be unprocessed or minimally processed complex starch food items.   Sometimes, it is important to balance nutrition by increasing protein intake (animal or plant source), fruits, and vegetables.  -Sticking to a routine mealtime to eat 3 meals a day and avoiding unnecessary snacks is shown to have a big role in weight control. Under normal circumstances, the only time we lose real weight is when we are hungry, so allow hunger to take place- hunger means no food between meal times, only water.  It is not advisable to starve.   -It is better to avoid simple carbohydrates including: Cakes, Sweet Desserts, Ice Cream, Soda (diet and regular), Sweet Tea, Candies, Chips, Cookies, Store Bought Juices, Alcohol in Excess of  1-2 drinks a day, Lemonade,  Artificial Sweeteners, Doughnuts, Coffee Creamers, "Sugar-free" Products, etc, etc.  This is not a complete list.....    -Consulting with certified diabetes educators is proven to provide you with the most accurate and current information on diet.  Also, you may be  interested in discussing diet options/exchanges , we can schedule a visit with [name], RDN, CDE for individualized nutrition education.  -Exercise: If you are able: 30 -60 minutes a day ,4 days a week, or 150 minutes a week.  The longer the better.  Combine stretch, strength, and aerobic activities.  If you were told in the  past that you have high risk for cardiovascular diseases, you may seek evaluation by your heart doctor prior to initiating moderate to intense exercise programs.                                  Additional Care Considerations for Diabetes   -Diabetes  is a chronic disease.  The most important care consideration is regular follow-up with your diabetes care provider with the goal being avoiding or delaying its complications and to take advantage of advances in medications and technology.    -Type 2 diabetes is known to coexist with other important comorbidities such as high blood pressure and high cholesterol.  It is critical to control not only the diabetes but also the high blood pressure and high cholesterol to minimize and delay the risk of complications including coronary artery disease, stroke, amputations, blindness, etc.    - Studies showed that people with diabetes will benefit from a class of medications known as ACE inhibitors and statins.  Unless there are specific reasons not to be on these medications, the standard of care is to consider getting one from these groups of medications at an optimal doses.  These medications are generally considered safe and proven to help protect the heart and the kidneys.    - People with diabetes are encouraged to initiate and maintain regular follow-up with eye doctors, foot   doctors, dentists , and if necessary heart and kidney doctors.     - It is highly recommended that people with diabetes quit smoking or stay away from smoking, and get yearly  flu vaccine and pneumonia vaccine at least every 5 years.  One other important lifestyle recommendation is to ensure adequate sleep - at least 6-7 hours of uninterrupted sleep at night.  -Exercise: If you are able: 30 -60 minutes a day, 4 days a week, or 150 minutes a week.  The longer the better.  Combine stretch, strength, and aerobic activities.  If you were told in the past that you have high risk for  cardiovascular diseases, you may seek evaluation by your heart doctor prior to initiating moderate to intense exercise programs.          

## 2020-08-21 NOTE — Progress Notes (Signed)
08/21/2020, 3:48 PM                                     Endocrinology follow-up note   Subjective:    Patient ID: [name], female    DOB: 11-Dec-1960.  TYESHA JOFFE is being seen in follow up after she was seen in consultation for management of currently uncontrolled symptomatic diabetes requested by  Sharilyn Sites, MD.   Past Medical History:  Diagnosis Date  . Adenomatous polyp 2009  . Anemia 2003 after surgery needed 1 unit blood  . Anxiety   . Arthritis   . Bipolar 1 disorder ([place])   . Constipation   . Depression   . Diabetes mellitus   . Diabetes mellitus, type II ([place])   . Diverticula of colon 2009  . Generalized headaches   . GERD (gastroesophageal reflux disease)   . History of hiatal hernia    small  . Hypertension   . Migraine   . Neuropathy    both feet  . Obesity   . Pelvic floor dysfunction    abnormal anorectal manometry at [facility] in 2009  . Plantar fasciitis both feet  . Sleep apnea    cpap setting of 3.5    Past Surgical History:  Procedure Laterality Date  . ABDOMINAL HYSTERECTOMY     compete  . CATARACT EXTRACTION W/PHACO Right 02/15/2017   Procedure: CATARACT EXTRACTION PHACO AND INTRAOCULAR LENS PLACEMENT (IOC);  Surgeon: Rutherford Guys, MD;  Location: [facility];  Service: Ophthalmology;  Laterality: Right;  CDE: 4.18  . CATARACT EXTRACTION W/PHACO Left 03/01/2017   Procedure: CATARACT EXTRACTION PHACO AND INTRAOCULAR LENS PLACEMENT (IOC);  Surgeon: Rutherford Guys, MD;  Location: [facility];  Service: Ophthalmology;  Laterality: Left;  CDE: 2.56  . COLON RESECTION    03/30/2002   with end-colostomy and Hartmann's pouch  . COLON SURGERY     complicated diverticulitis requiring sigmoid resection with colostomy and subsequent takedown  . COLONOSCOPY  12/11/2007   Dr. Gala Romney- marginal prep, normal rectum pancolonic diverticula, adenomatous polyp  . COLONOSCOPY  08/28/2003      Wide open colonic anastomosis/Scattered  diverticula noted throughout colon/ Small external hemorrhoids  . COLONOSCOPY  04/2012   [facility]: hyperplastic polyps, diverticulosis, ileocolonic anastomosis.  . COLONOSCOPY WITH PROPOFOL N/A 06/07/2016   Procedure: COLONOSCOPY WITH PROPOFOL;  Surgeon: Daneil Dolin, MD;  Location: [facility];  Service: Endoscopy;  Laterality: N/A;  7:30 am  . COLOSTOMY CLOSURE  07/10/2002  . ESOPHAGOGASTRODUODENOSCOPY N/A 01/08/2020   Procedure: UPPER GASTROINTESTINAL ENDOSCOPY;  Surgeon: Alphonsa Overall, MD;  Location: [facility];  Service: General;  Laterality: N/A;  . ESOPHAGOGASTRODUODENOSCOPY (EGD) WITH PROPOFOL N/A 08/23/2016   Procedure: ESOPHAGOGASTRODUODENOSCOPY (EGD) WITH PROPOFOL;  Surgeon: Alphonsa Overall, MD;  Location: [facility];  Service: General;  Laterality: N/A;  . FLEXIBLE SIGMOIDOSCOPY  01/20/2012   [facility]: incomplete/attempted colonoscopy. Inadequate prep precluded examination  . HEEL SPUR SURGERY Left 09/11/2013   both have been done  . KNEE ARTHROSCOPY  02/04/2004    left knee/partial medial meniscectomy.  Marland Kitchen KNEE SURGERY     3 arthroscopic  2 on left 1 on right  . LAPAROSCOPIC GASTRIC BANDING  2009  . LAPAROSCOPIC GASTRIC SLEEVE RESECTION N/A 01/08/2020   Procedure: LAPAROSCOPIC SLEEVE GASTRECTOMY;  Surgeon: Alphonsa Overall, MD;  Location: [facility];  Service: General;  Laterality: N/A;  .  LAPAROSCOPIC SALPINGOOPHERECTOMY  03/30/2002  . TUBAL LIGATION      Social History   Socioeconomic History  . Marital status: Married    Spouse name: Not on file  . Number of children: 2  . Years of education: college  . Highest education level: Not on file  Occupational History  . Occupation: student at [employer]    Employer: UNEMPLOYED    Employer: DISABLED  Tobacco Use  . Smoking status: Never Smoker  . Smokeless tobacco: Never Used  Vaping Use  . Vaping Use: Never used  Substance and Sexual Activity  . Alcohol use: Not Currently    Comment: occasionally wine  . Drug use: No  . Sexual activity:  Not Currently    Birth control/protection: Surgical  Other Topics Concern  . Not on file  Social History Narrative  . Not on file   Social Determinants of Health   Financial Resource Strain: Not on file  Food Insecurity: Not on file  Transportation Needs: Not on file  Physical Activity: Not on file  Stress: Not on file  Social Connections: Not on file    Family History  Problem Relation Age of Onset  . Ovarian cancer Mother   . Heart disease Mother   . Anxiety disorder Mother   . Cirrhosis Father        deceased age 77  . Alcohol abuse Father   . Liver cancer Cousin        age 30, deceased  . Drug abuse Cousin   . ADD / ADHD Son   . Anxiety disorder Sister   . Dementia Maternal Grandfather   . Colon cancer Other        aunt, deceased age 50  . Breast cancer Other        aunt, deceased age 30  . Bipolar disorder Neg Hx   . Depression Neg Hx   . OCD Neg Hx   . Paranoid behavior Neg Hx   . Schizophrenia Neg Hx   . Seizures Neg Hx   . Sexual abuse Neg Hx   . Physical abuse Neg Hx     Outpatient Encounter Medications as of 08/21/2020  Medication Sig  . Dulaglutide (TRULICITY) 0.75 MG/0.5ML SOPN Inject 0.75 mg into the skin once a week.  . ALPRAZolam (XANAX) 1 MG tablet Take 1 tablet (1 mg total) by mouth 3 (three) times daily as needed for anxiety.  . ARIPiprazole (ABILIFY) 10 MG tablet Take 1 tablet (10 mg total) by mouth at bedtime.  . ascorbic acid (VITAMIN C) 500 MG tablet Take 500 mg by mouth daily.  . Blood Glucose Monitoring Suppl (FREESTYLE LITE) DEVI Use to measure glucose 4 times a day  . Continuous Blood Gluc Receiver (FREESTYLE LIBRE 2 READER) DEVI As directed  . Continuous Blood Gluc Sensor (FREESTYLE LIBRE 2 SENSOR) MISC 1 Piece by Does not apply route every 14 (fourteen) days.  . cycloSPORINE (RESTASIS) 0.05 % ophthalmic emulsion Place 2 drops into both eyes 2 (two) times daily.   . escitalopram (LEXAPRO) 20 MG tablet Take 1 tablet (20 mg total) by  mouth daily.  . fluticasone (FLONASE) 50 MCG/ACT nasal spray Place 2 sprays into the nose daily as needed for allergies.   . gabapentin (NEURONTIN) 300 MG capsule Take 300 mg by mouth 2 (two) times daily.  . glucose blood test strip Test 4 times a day, she has freestyle lite meter  . Insulin Pen Needle (B-D ULTRAFINE III SHORT PEN) 31G X 8   MM MISC 1 each by Does not apply route as directed.  Marland Kitchen lisinopril (ZESTRIL) 5 MG tablet Take 5 mg by mouth daily.  Marland Kitchen loratadine (CLARITIN) 10 MG tablet Take 10 mg by mouth daily.  . meclizine (ANTIVERT) 25 MG tablet Take 25 mg by mouth 2 (two) times daily as needed for dizziness.   . Menthol, Topical Analgesic, (ICY HOT EX) Apply 1 application topically 4 (four) times daily as needed (joint pain.).  Marland Kitchen metFORMIN (GLUCOPHAGE) 1000 MG tablet Take 1 tablet (1,000 mg total) by mouth 2 (two) times daily with a meal.  . methocarbamol (ROBAXIN) 500 MG tablet Take 500 mg by mouth 3 (three) times daily as needed for muscle spasms.   . Multiple Vitamin (MULTIVITAMIN WITH MINERALS) TABS tablet Take 1 tablet by mouth daily.  . ondansetron (ZOFRAN ODT) 4 MG disintegrating tablet Take 1 tablet (4 mg total) by mouth every 8 (eight) hours as needed for nausea or vomiting.  . pantoprazole (PROTONIX) 40 MG tablet Take 1 tablet (40 mg total) by mouth 2 (two) times daily. (Patient taking differently: Take 40 mg by mouth daily before breakfast. )  . Probiotic CHEW Chew 1-2 capsules by mouth daily. *Hold for diarrhea* (Gummy)  . Propylene Glycol-Glycerin (SOOTHE) 0.6-0.6 % SOLN Place 1 drop into both eyes 2 (two) times daily.  . rizatriptan (MAXALT-MLT) 10 MG disintegrating tablet Take 10 mg by mouth as needed for migraine. May repeat in 2 hours if needed  . temazepam (RESTORIL) 30 MG capsule Take 1 capsule (30 mg total) by mouth at bedtime as needed for sleep.  . traMADol (ULTRAM) 50 MG tablet One tablet every six hours as needed for pain.  . [DISCONTINUED] insulin glargine (LANTUS  SOLOSTAR) 100 UNIT/ML Solostar Pen Inject 20 Units into the skin at bedtime.   No facility-administered encounter medications on file as of 08/21/2020.    ALLERGIES: Allergies  Allergen Reactions  . Bactrim Itching, Nausea And Vomiting and Other (See Comments)    Redness    VACCINATION STATUS: Immunization History  Administered Date(s) Administered  . Moderna Sars-Covid-2 Vaccination 07/21/2019, 08/22/2019  . Pneumococcal Polysaccharide-23 01/09/2020    Diabetes She presents for her follow-up diabetic visit. She has type 2 diabetes mellitus. Onset time: She was diagnosed at approximate age of 22 years. Her disease course has been improving (She underwent sleeve gastrectomy on January 08, 2020, and she has lost 14 pounds since her surgery.). There are no hypoglycemic associated symptoms. Pertinent negatives for hypoglycemia include no confusion, headaches, pallor or seizures. Associated symptoms include fatigue. Pertinent negatives for diabetes include no chest pain, no polydipsia, no polyphagia and no polyuria. There are no hypoglycemic complications. Symptoms are improving. Diabetic complications include heart disease. Risk factors for coronary artery disease include dyslipidemia, family history, diabetes mellitus, hypertension, obesity, sedentary lifestyle and post-menopausal. Her weight is decreasing steadily (She has lost 55 lbs overall since her sleeve gastrectomy.). She is following a generally unhealthy diet. When asked about meal planning, she reported none. She has not had a previous visit with a dietitian. She never participates in exercise. Her home blood glucose trend is decreasing steadily. Her breakfast blood glucose range is generally 110-130 mg/dl. Her lunch blood glucose range is generally 110-130 mg/dl. Her dinner blood glucose range is generally 110-130 mg/dl. Her bedtime blood glucose range is generally 110-130 mg/dl. Her overall blood glucose range is 110-130 mg/dl. (She  presents with her CGM device showing 98% time range, 2% slightly above range.  Her point-of-care A1c 6%,  significantly improving.  She has lost 55 pounds overall from her bariatric surgery.    ) An ACE inhibitor/angiotensin II receptor blocker is being taken. Eye exam is current.  Hypertension This is a chronic problem. The current episode started more than 1 year ago. The problem is uncontrolled. Pertinent negatives include no chest pain, headaches, palpitations or shortness of breath. Risk factors for coronary artery disease include diabetes mellitus, obesity, sedentary lifestyle and post-menopausal state. Past treatments include ACE inhibitors.    Review of systems: Limited as above. Objective:    BP 116/68   Pulse 72   Ht 5' 6" (1.676 m)   Wt 254 lb (115.2 kg)   BMI 41.00 kg/m   Wt Readings from Last 3 Encounters:  08/21/20 254 lb (115.2 kg)  04/22/20 263 lb (119.3 kg)  03/06/20 276 lb 3.2 oz (125.3 kg)     Physical Exam- Limited  Constitutional:  Body mass index is 41 kg/m. , not in acute distress, normal state of mind    CMP     Component Value Date/Time   NA 145 (H) 08/13/2020 0813   K 4.4 08/13/2020 0813   CL 102 08/13/2020 0813   CO2 22 08/13/2020 0813   GLUCOSE 97 08/13/2020 0813   GLUCOSE 116 (H) 01/01/2020 1356   BUN 19 08/13/2020 0813   CREATININE 0.61 08/13/2020 0813   CREATININE 0.74 03/23/2011 0400   CALCIUM 9.4 08/13/2020 0813   PROT 6.8 08/13/2020 0813   ALBUMIN 4.3 08/13/2020 0813   AST 20 08/13/2020 0813   ALT 15 08/13/2020 0813   ALKPHOS 86 08/13/2020 0813   BILITOT 0.3 08/13/2020 0813   GFRNONAA >60 01/01/2020 1356   GFRAA >60 01/01/2020 1356   Recent Results (from the past 2160 hour(s))  [facility] DIABETES EYE EXAM     Status: None   Collection Time: 06/19/20 12:00 AM  Result Value Ref Range   HM Diabetic Eye Exam No Retinopathy No Retinopathy  Comprehensive metabolic panel     Status: Abnormal   Collection Time: 08/13/20  8:13 AM  Result  Value Ref Range   Glucose 97 65 - 99 mg/dL   BUN 19 8 - 27 mg/dL   Creatinine, Ser 0.61 0.57 - 1.00 mg/dL   eGFR 102 >59 mL/min/1.73   BUN/Creatinine Ratio 31 (H) 12 - 28   Sodium 145 (H) 134 - 144 mmol/L   Potassium 4.4 3.5 - 5.2 mmol/L   Chloride 102 96 - 106 mmol/L   CO2 22 20 - 29 mmol/L   Calcium 9.4 8.7 - 10.3 mg/dL   Total Protein 6.8 6.0 - 8.5 g/dL   Albumin 4.3 3.8 - 4.9 g/dL   Globulin, Total 2.5 1.5 - 4.5 g/dL   Albumin/Globulin Ratio 1.7 1.2 - 2.2   Bilirubin Total 0.3 0.0 - 1.2 mg/dL   Alkaline Phosphatase 86 44 - 121 IU/L   AST 20 0 - 40 IU/L   ALT 15 0 - 32 IU/L  Lipid panel     Status: Abnormal   Collection Time: 08/13/20  8:13 AM  Result Value Ref Range   Cholesterol, Total 175 100 - 199 mg/dL   Triglycerides 122 0 - 149 mg/dL   HDL 52 >39 mg/dL   VLDL Cholesterol Cal 22 5 - 40 mg/dL   LDL Chol Calc (NIH) 101 (H) 0 - 99 mg/dL   Chol/HDL Ratio 3.4 0.0 - 4.4 ratio    Comment:  T. Chol/HDL Ratio                                             Men  Women                               1/2 Avg.Risk  3.4    3.3                                   Avg.Risk  5.0    4.4                                2X Avg.Risk  9.6    7.1                                3X Avg.Risk 23.4   11.0   TSH     Status: None   Collection Time: 08/13/20  8:13 AM  Result Value Ref Range   TSH 1.310 0.450 - 4.500 uIU/mL  T4, free     Status: None   Collection Time: 08/13/20  8:13 AM  Result Value Ref Range   Free T4 1.20 0.82 - 1.77 ng/dL  VITAMIN D 25 Hydroxy (Vit-D Deficiency, Fractures)     Status: None   Collection Time: 08/13/20  8:13 AM  Result Value Ref Range   Vit D, 25-Hydroxy 32.3 30.0 - 100.0 ng/mL    Comment: Vitamin D deficiency has been defined by the Institute of Medicine and an Endocrine Society practice guideline as a level of serum 25-OH vitamin D less than 20 ng/mL (1,2). The Endocrine Society went on to further define vitamin  D insufficiency as a level between 21 and 29 ng/mL (2). 1. IOM (Institute of Medicine). 2010. Dietary reference    intakes for calcium and D. [place]: The    National Academies Press. 2. Holick MF, Binkley [place], Bischoff-Ferrari HA, et al.    Evaluation, treatment, and prevention of vitamin D    deficiency: an Endocrine Society clinical practice    guideline. JCEM. 2011 Jul; 96(7):1911-30.   HgB A1c     Status: None   Collection Time: 08/21/20  3:35 PM  Result Value Ref Range   Hemoglobin A1C     HbA1c POC (<> result, manual entry)     HbA1c, POC (prediabetic range)     HbA1c, POC (controlled diabetic range) 6.0 0.0 - 7.0 %     Assessment & Plan:   1. Uncontrolled type 2 diabetes mellitus with hyperglycemia (HCC)  - Constanza P Marseille has currently uncontrolled symptomatic type 2 DM since  60 years of age. She presents with her CGM device showing 98% time range, 2% slightly above range.  Her point-of-care A1c 6%, significantly improving.  She has lost 55 pounds overall from her bariatric surgery.    - Recent labs reviewed. - I had a long discussion with her about the progressive nature of diabetes and the pathology behind its complications. -her diabetes is complicated by morbid obesity, sedentary life, hypertension and she remains at a high risk for more acute and chronic complications which include CAD, CVA,   CKD, retinopathy, and neuropathy. These are all discussed in detail with her. -She is currently being prepared for sleeve gastrectomy at the end of this month.  - I have counseled her on diet  and weight management  by adopting a carbohydrate restricted/protein rich diet. Patient is encouraged to switch to  unprocessed or minimally processed     complex starch and increased protein intake (animal or plant source), fruits, and vegetables. -  she is advised to stick to a routine mealtimes to eat 3 meals  a day and avoid unnecessary snacks ( to snack only to correct  hypoglycemia).   - she acknowledges that there is a room for improvement in her food and drink choices. - Suggestion is made for her to avoid simple carbohydrates  from her diet including Cakes, Sweet Desserts, Ice Cream, Soda (diet and regular), Sweet Tea, Candies, Chips, Cookies, Store Bought Juices, Alcohol in Excess of  1-2 drinks a day, Artificial Sweeteners,  Coffee Creamer, and "Sugar-free" Products, Lemonade. This will help patient to have more stable blood glucose profile and potentially avoid unintended weight gain.  - I have approached her with the following individualized plan to manage  her diabetes and patient agrees:   -She is responding to the gastrectomy already with near target glycemic profile.  She has lost 55 pounds overall.  In light of her A1c at 6%, she is advised to discontinue Lantus.  She is advised to continue Metformin 1000 mg p.o. twice daily.  She may benefit from incretin therapy I discussed and added Trulicity 0.75 mg subcutaneously weekly.  Side effects and precautions discussed with her.  She was treated with [place] before.    - Specific targets for  A1c;  LDL, HDL,  and Triglycerides were discussed with the patient.   2) Blood Pressure /Hypertension: -Her blood pressure is controlled to target.   she is advised to continue her current medications including lisinopril 5 mg p.o. daily with breakfast , lisinopril will be increased to  10 mg for next refill.  3) Lipids/Hyperlipidemia:     -Her recent lipid panel showed uncontrolled LDL at 101.  She will be considered for statin therapy after next visit.    4)  Weight/Diet: Her BMI is 41.00 , lost 55 pounds since sleeve gastrectomy.  She is expected to continue to lose  More weight.    5) Chronic Care/Health Maintenance:  -she  is on ACEI/ARB and  is encouraged to initiate and continue to follow up with Ophthalmology, Dentist,  Podiatrist at least yearly or according to recommendations, and advised to  stay away  from smoking. I have recommended yearly flu vaccine and pneumonia vaccine at least every 5 years; moderate intensity exercise for up to 150 minutes weekly; and  sleep for at least 7 hours a day.  Her screening ABI was normal in December 2022, This study will be repeated in 5 years-December 2026, or sooner if needed.  - she is  advised to maintain close follow up with Golding, John, MD for primary care needs, as well as her other providers for optimal and coordinated care.    I spent 40 minutes in the care of the patient today including review of labs from CMP, Lipids, Thyroid Function, Hematology (current and previous including abstractions from other facilities); face-to-face time discussing  her blood glucose readings/logs, discussing hypoglycemia and hyperglycemia episodes and symptoms, medications doses, her options of short and long term treatment based on the latest standards of care / guidelines;    discussion about incorporating lifestyle medicine;  and documenting the encounter.    Please refer to Patient Instructions for Blood Glucose Monitoring and Insulin/Medications Dosing Guide"  in media tab for additional information. Please  also refer to " Patient Self Inventory" in the Media  tab for reviewed elements of pertinent patient history.  Zenab P Weisberg participated in the discussions, expressed understanding, and voiced agreement with the above plans.  All questions were answered to her satisfaction. she is encouraged to contact clinic should she have any questions or concerns prior to her return visit.    Follow up plan: - Return in about 4 months (around 12/21/2020) for Bring Meter and Logs- A1c in [place], [place] with .  Gebre , MD [facility] [address] [place], [place] [address] Phone: [phone]  Fax: [phone]    08/21/2020, 3:48 PM  This note was partially dictated with voice recognition software. Similar  sounding words can be transcribed inadequately or may not  be corrected upon review.  

## 2020-08-25 ENCOUNTER — Telehealth: Payer: Self-pay

## 2020-08-25 ENCOUNTER — Other Ambulatory Visit: Payer: Self-pay | Admitting: "Endocrinology

## 2020-08-25 MED ORDER — GABAPENTIN 100 MG PO CAPS: 100.0000 mg | ORAL_CAPSULE | Freq: Two times a day (BID) | ORAL | 2 refills | Status: DC

## 2020-08-25 MED ORDER — GABAPENTIN 100 MG PO CAPS: 100.0000 mg | ORAL_CAPSULE | Freq: Two times a day (BID) | ORAL | 0 refills | Status: DC

## 2020-08-25 NOTE — Telephone Encounter (Signed)
Done

## 2020-08-25 NOTE — Telephone Encounter (Signed)
Patient called and stated that she had forgotten to ask you to refill the Gabapentin that she had gotten from the ENT specialist prior, she stated that she had discussed with you at previous visit on how it was helping with the neuropathy in her feet and you had mentioned at that time that she was taking too many MG , you would recommend 100mg  BID. She wanted to see if you could [facility] as she no longer sees the Specialist.

## 2020-08-25 NOTE — Telephone Encounter (Signed)
Can you send to [facility]?

## 2020-08-25 NOTE — Telephone Encounter (Signed)
Done to [employer].

## 2020-08-30 DIAGNOSIS — E1165 Type 2 diabetes mellitus with hyperglycemia: Secondary | ICD-10-CM | POA: Diagnosis not present

## 2020-09-01 DIAGNOSIS — J22 Unspecified acute lower respiratory infection: Secondary | ICD-10-CM | POA: Diagnosis not present

## 2020-09-01 DIAGNOSIS — U071 COVID-19: Secondary | ICD-10-CM | POA: Diagnosis not present

## 2020-09-04 DIAGNOSIS — M1711 Unilateral primary osteoarthritis, right knee: Secondary | ICD-10-CM | POA: Diagnosis not present

## 2020-09-04 DIAGNOSIS — M170 Bilateral primary osteoarthritis of knee: Secondary | ICD-10-CM | POA: Diagnosis not present

## 2020-09-04 DIAGNOSIS — M79671 Pain in right foot: Secondary | ICD-10-CM | POA: Diagnosis not present

## 2020-09-25 DIAGNOSIS — G43909 Migraine, unspecified, not intractable, without status migrainosus: Secondary | ICD-10-CM | POA: Diagnosis not present

## 2020-09-25 DIAGNOSIS — E1165 Type 2 diabetes mellitus with hyperglycemia: Secondary | ICD-10-CM | POA: Diagnosis not present

## 2020-09-25 DIAGNOSIS — K219 Gastro-esophageal reflux disease without esophagitis: Secondary | ICD-10-CM | POA: Diagnosis not present

## 2020-09-25 DIAGNOSIS — Z9884 Bariatric surgery status: Secondary | ICD-10-CM | POA: Diagnosis not present

## 2020-09-25 DIAGNOSIS — I10 Essential (primary) hypertension: Secondary | ICD-10-CM | POA: Diagnosis not present

## 2020-09-30 DIAGNOSIS — E1165 Type 2 diabetes mellitus with hyperglycemia: Secondary | ICD-10-CM | POA: Diagnosis not present

## 2020-10-01 DIAGNOSIS — E782 Mixed hyperlipidemia: Secondary | ICD-10-CM | POA: Diagnosis not present

## 2020-10-01 DIAGNOSIS — E7849 Other hyperlipidemia: Secondary | ICD-10-CM | POA: Diagnosis not present

## 2020-10-01 DIAGNOSIS — D518 Other vitamin B12 deficiency anemias: Secondary | ICD-10-CM | POA: Diagnosis not present

## 2020-10-01 DIAGNOSIS — G43909 Migraine, unspecified, not intractable, without status migrainosus: Secondary | ICD-10-CM | POA: Diagnosis not present

## 2020-10-01 DIAGNOSIS — M47816 Spondylosis without myelopathy or radiculopathy, lumbar region: Secondary | ICD-10-CM | POA: Diagnosis not present

## 2020-10-21 ENCOUNTER — Telehealth (HOSPITAL_COMMUNITY): Payer: Self-pay | Admitting: *Deleted

## 2020-10-21 NOTE — Telephone Encounter (Signed)
Patient is needing refills for all of her medications provider prescribes for her. Patient appt was cancelled due to provider being on medical leave. Message was left to call office to resch appt.  

## 2020-10-22 ENCOUNTER — Other Ambulatory Visit (HOSPITAL_COMMUNITY): Payer: Self-pay | Admitting: Psychiatry

## 2020-10-22 DIAGNOSIS — G4730 Sleep apnea, unspecified: Secondary | ICD-10-CM | POA: Diagnosis not present

## 2020-10-22 DIAGNOSIS — G4733 Obstructive sleep apnea (adult) (pediatric): Secondary | ICD-10-CM | POA: Diagnosis not present

## 2020-10-22 MED ORDER — ARIPIPRAZOLE 10 MG PO TABS: 10.0000 mg | ORAL_TABLET | Freq: Every day | ORAL | 2 refills | Status: DC

## 2020-10-22 MED ORDER — ALPRAZOLAM 1 MG PO TABS: 1.0000 mg | ORAL_TABLET | Freq: Three times a day (TID) | ORAL | 1 refills | Status: DC | PRN

## 2020-10-22 MED ORDER — TEMAZEPAM 30 MG PO CAPS: 30.0000 mg | ORAL_CAPSULE | Freq: Every evening | ORAL | 2 refills | Status: DC | PRN

## 2020-10-22 MED ORDER — ESCITALOPRAM OXALATE 20 MG PO TABS: 20.0000 mg | ORAL_TABLET | Freq: Every day | ORAL | 2 refills | Status: DC

## 2020-10-22 NOTE — Telephone Encounter (Signed)
sent 

## 2020-10-30 DIAGNOSIS — M47816 Spondylosis without myelopathy or radiculopathy, lumbar region: Secondary | ICD-10-CM | POA: Diagnosis not present

## 2020-10-31 DIAGNOSIS — E1165 Type 2 diabetes mellitus with hyperglycemia: Secondary | ICD-10-CM | POA: Diagnosis not present

## 2020-11-04 DIAGNOSIS — J029 Acute pharyngitis, unspecified: Secondary | ICD-10-CM | POA: Diagnosis not present

## 2020-11-04 DIAGNOSIS — J069 Acute upper respiratory infection, unspecified: Secondary | ICD-10-CM | POA: Diagnosis not present

## 2020-11-19 ENCOUNTER — Telehealth (HOSPITAL_COMMUNITY): Payer: Medicare Other | Admitting: Psychiatry

## 2020-11-20 DIAGNOSIS — M1712 Unilateral primary osteoarthritis, left knee: Secondary | ICD-10-CM | POA: Diagnosis not present

## 2020-11-25 DIAGNOSIS — M25562 Pain in left knee: Secondary | ICD-10-CM | POA: Diagnosis not present

## 2020-11-27 DIAGNOSIS — M13862 Other specified arthritis, left knee: Secondary | ICD-10-CM | POA: Diagnosis not present

## 2020-12-01 DIAGNOSIS — E1165 Type 2 diabetes mellitus with hyperglycemia: Secondary | ICD-10-CM | POA: Diagnosis not present

## 2020-12-04 DIAGNOSIS — M1712 Unilateral primary osteoarthritis, left knee: Secondary | ICD-10-CM | POA: Diagnosis not present

## 2020-12-09 DIAGNOSIS — Z9884 Bariatric surgery status: Secondary | ICD-10-CM | POA: Diagnosis not present

## 2020-12-19 DIAGNOSIS — R5383 Other fatigue: Secondary | ICD-10-CM | POA: Diagnosis not present

## 2020-12-19 DIAGNOSIS — Z9884 Bariatric surgery status: Secondary | ICD-10-CM | POA: Diagnosis not present

## 2020-12-19 DIAGNOSIS — E1169 Type 2 diabetes mellitus with other specified complication: Secondary | ICD-10-CM | POA: Diagnosis not present

## 2020-12-19 DIAGNOSIS — K912 Postsurgical malabsorption, not elsewhere classified: Secondary | ICD-10-CM | POA: Diagnosis not present

## 2020-12-23 DIAGNOSIS — M13861 Other specified arthritis, right knee: Secondary | ICD-10-CM | POA: Diagnosis not present

## 2020-12-30 ENCOUNTER — Other Ambulatory Visit: Payer: Self-pay

## 2020-12-30 ENCOUNTER — Telehealth (INDEPENDENT_AMBULATORY_CARE_PROVIDER_SITE_OTHER): Payer: Medicare Other | Admitting: Psychiatry

## 2020-12-30 ENCOUNTER — Encounter: Payer: Self-pay | Admitting: "Endocrinology

## 2020-12-30 ENCOUNTER — Ambulatory Visit (INDEPENDENT_AMBULATORY_CARE_PROVIDER_SITE_OTHER): Payer: Medicare Other | Admitting: "Endocrinology

## 2020-12-30 ENCOUNTER — Encounter (HOSPITAL_COMMUNITY): Payer: Self-pay | Admitting: Psychiatry

## 2020-12-30 VITALS — Ht 66.0 in | Wt 247.6 lb

## 2020-12-30 DIAGNOSIS — E559 Vitamin D deficiency, unspecified: Secondary | ICD-10-CM

## 2020-12-30 DIAGNOSIS — E782 Mixed hyperlipidemia: Secondary | ICD-10-CM | POA: Diagnosis not present

## 2020-12-30 DIAGNOSIS — E1165 Type 2 diabetes mellitus with hyperglycemia: Secondary | ICD-10-CM

## 2020-12-30 DIAGNOSIS — M13861 Other specified arthritis, right knee: Secondary | ICD-10-CM | POA: Diagnosis not present

## 2020-12-30 DIAGNOSIS — F3162 Bipolar disorder, current episode mixed, moderate: Secondary | ICD-10-CM

## 2020-12-30 DIAGNOSIS — I10 Essential (primary) hypertension: Secondary | ICD-10-CM

## 2020-12-30 LAB — POCT GLYCOSYLATED HEMOGLOBIN (HGB A1C): HbA1c, POC (controlled diabetic range): 5.9 % (ref 0.0–7.0)

## 2020-12-30 MED ORDER — TRULICITY 0.75 MG/0.5ML ~~LOC~~ SOAJ: 0.7500 mg | SUBCUTANEOUS | 1 refills | Status: DC

## 2020-12-30 MED ORDER — TEMAZEPAM 30 MG PO CAPS: 30.0000 mg | ORAL_CAPSULE | Freq: Every evening | ORAL | 2 refills | Status: DC | PRN

## 2020-12-30 MED ORDER — ALPRAZOLAM 1 MG PO TABS: 1.0000 mg | ORAL_TABLET | Freq: Three times a day (TID) | ORAL | 1 refills | Status: DC | PRN

## 2020-12-30 MED ORDER — GABAPENTIN 100 MG PO CAPS: 100.0000 mg | ORAL_CAPSULE | Freq: Two times a day (BID) | ORAL | 0 refills | Status: DC

## 2020-12-30 MED ORDER — ARIPIPRAZOLE 10 MG PO TABS: 10.0000 mg | ORAL_TABLET | Freq: Every day | ORAL | 2 refills | Status: DC

## 2020-12-30 MED ORDER — ESCITALOPRAM OXALATE 20 MG PO TABS: 20.0000 mg | ORAL_TABLET | Freq: Every day | ORAL | 2 refills | Status: DC

## 2020-12-30 NOTE — Progress Notes (Signed)
12/30/2020, 4:43 PM                                     Endocrinology follow-up note   Subjective:    Patient ID: [name], female    DOB: 03-07-1961.  [name] is being seen in follow up after she was seen in consultation for management of currently uncontrolled symptomatic diabetes requested by  Sharilyn Sites, MD.   Past Medical History:  Diagnosis Date   Adenomatous polyp 2009   Anemia 2003 after surgery needed 1 unit blood   Anxiety    Arthritis    Bipolar 1 disorder ([place])    Constipation    Depression    Diabetes mellitus    Diabetes mellitus, type II ([place])    Diverticula of colon 2009   Generalized headaches    GERD (gastroesophageal reflux disease)    History of hiatal hernia    small   Hypertension    Migraine    Neuropathy    both feet   Obesity    Pelvic floor dysfunction    abnormal anorectal manometry at [facility] in 2009   Plantar fasciitis both feet   Sleep apnea    cpap setting of 3.5    Past Surgical History:  Procedure Laterality Date   ABDOMINAL HYSTERECTOMY     compete   CATARACT EXTRACTION W/PHACO Right 02/15/2017   Procedure: CATARACT EXTRACTION PHACO AND INTRAOCULAR LENS PLACEMENT ([place]);  Surgeon: Rutherford Guys, MD;  Location: [facility];  Service: Ophthalmology;  Laterality: Right;  CDE: 4.18   CATARACT EXTRACTION W/PHACO Left 03/01/2017   Procedure: CATARACT EXTRACTION PHACO AND INTRAOCULAR LENS PLACEMENT ([facility]);  Surgeon: Rutherford Guys, MD;  Location: [facility];  Service: Ophthalmology;  Laterality: Left;  CDE: 2.56   COLON RESECTION    03/30/2002   with end-colostomy and Hartmann's pouch   COLON SURGERY     complicated diverticulitis requiring sigmoid resection with colostomy and subsequent takedown   COLONOSCOPY  12/11/2007   Dr. Gala Romney- marginal prep, normal rectum pancolonic diverticula, adenomatous polyp   COLONOSCOPY  08/28/2003      Wide open colonic anastomosis/Scattered diverticula noted throughout  colon/ Small external hemorrhoids   COLONOSCOPY  04/2012   [facility]: hyperplastic polyps, diverticulosis, ileocolonic anastomosis.   COLONOSCOPY WITH PROPOFOL N/A 06/07/2016   Procedure: COLONOSCOPY WITH PROPOFOL;  Surgeon: Daneil Dolin, MD;  Location: [facility];  Service: Endoscopy;  Laterality: N/A;  7:30 am   COLOSTOMY CLOSURE  07/10/2002   ESOPHAGOGASTRODUODENOSCOPY N/A 01/08/2020   Procedure: UPPER GASTROINTESTINAL ENDOSCOPY;  Surgeon: Alphonsa Overall, MD;  Location: [facility];  Service: General;  Laterality: N/A;   ESOPHAGOGASTRODUODENOSCOPY (EGD) WITH PROPOFOL N/A 08/23/2016   Procedure: ESOPHAGOGASTRODUODENOSCOPY (EGD) WITH PROPOFOL;  Surgeon: Alphonsa Overall, MD;  Location: [facility];  Service: General;  Laterality: N/A;   FLEXIBLE SIGMOIDOSCOPY  01/20/2012   [facility]: incomplete/attempted colonoscopy. Inadequate prep precluded examination   HEEL SPUR SURGERY Left 09/11/2013   both have been done   KNEE ARTHROSCOPY  02/04/2004    left knee/partial medial meniscectomy.   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    Employer: DISABLED  Tobacco Use   Smoking status: Never   Smokeless tobacco: Never  Vaping Use   Vaping Use: Never used  Substance and Sexual Activity   Alcohol use: Not Currently    Comment: occasionally wine   Drug use: No   Sexual activity: Not Currently    Birth control/protection: Surgical  Other Topics  Concern   Not on file  Social History Narrative   Not on file   Social Determinants of Health   Financial Resource Strain: Not on file  Food Insecurity: Not on file  Transportation Needs: Not on file  Physical Activity: Not on file  Stress: Not on file  Social Connections: Not on file    Family History  Problem Relation Age of Onset   Ovarian cancer Mother    Heart disease Mother    Anxiety disorder Mother    Cirrhosis Father        deceased age 60   Alcohol abuse Father    Liver cancer Cousin        age 81, deceased   Drug abuse Cousin    ADD / ADHD Son    Anxiety disorder Sister    Dementia Maternal Grandfather    Colon cancer Other        aunt, deceased age 19   Breast cancer Other        aunt, deceased age 15   Bipolar disorder Neg Hx    Depression Neg Hx    OCD Neg Hx    Paranoid behavior Neg Hx    Schizophrenia Neg Hx    Seizures Neg Hx    Sexual abuse Neg Hx    Physical abuse Neg Hx     Outpatient Encounter Medications as of 12/30/2020  Medication Sig   ALPRAZolam (XANAX) 1 MG tablet Take 1 tablet (1 mg total) by mouth 3 (three) times daily as needed for anxiety.   ARIPiprazole (ABILIFY) 10 MG tablet Take 1 tablet (10 mg total) by mouth at bedtime.   ascorbic acid (VITAMIN C) 500 MG tablet Take 500 mg by mouth daily.   Blood Glucose Monitoring Suppl (FREESTYLE LITE) DEVI Use to measure glucose 4 times a day   Continuous Blood Gluc Receiver (FREESTYLE LIBRE 2 READER) DEVI As directed   Continuous Blood Gluc Sensor (FREESTYLE LIBRE 2 SENSOR) MISC 1 Piece by Does not apply route every 14 (fourteen) days.   cycloSPORINE (RESTASIS) 0.05 % ophthalmic emulsion Place 2 drops into both eyes 2 (two) times daily.    Dulaglutide (TRULICITY) A999333 0000000 SOPN Inject 0.75 mg into the skin once a week.   escitalopram (LEXAPRO) 20 MG tablet Take 1 tablet (20 mg total) by mouth daily.   fluticasone (FLONASE) 50 MCG/ACT nasal spray Place 2 sprays into the nose daily as  needed for allergies.    gabapentin (NEURONTIN) 100 MG capsule Take 1 capsule (100 mg total) by mouth 2 (two) times daily.   glucose blood test strip Test 4 times a day, she has freestyle lite meter   Insulin Pen Needle (B-D ULTRAFINE III SHORT PEN) 31G X  8 MM MISC 1 each by Does not apply route as directed.   lisinopril (ZESTRIL) 5 MG tablet Take 5 mg by mouth daily.   loratadine (CLARITIN) 10 MG tablet Take 10 mg by mouth daily.   meclizine (ANTIVERT) 25 MG tablet Take 25 mg by mouth 2 (two) times daily as needed for dizziness.    Menthol, Topical Analgesic, (ICY HOT EX) Apply 1 application topically 4 (four) times daily as needed (joint pain.).   methocarbamol (ROBAXIN) 500 MG tablet Take 500 mg by mouth 3 (three) times daily as needed for muscle spasms.    Multiple Vitamin (MULTIVITAMIN WITH MINERALS) TABS tablet Take 1 tablet by mouth daily.   ondansetron (ZOFRAN ODT) 4 MG disintegrating tablet Take 1 tablet (4 mg total) by mouth every 8 (eight) hours as needed for nausea or vomiting.   pantoprazole (PROTONIX) 40 MG tablet Take 1 tablet (40 mg total) by mouth 2 (two) times daily. (Patient taking differently: Take 40 mg by mouth daily before breakfast. )   Probiotic CHEW Chew 1-2 capsules by mouth daily. *Hold for diarrhea* (Gummy)   Propylene Glycol-Glycerin (SOOTHE) 0.6-0.6 % SOLN Place 1 drop into both eyes 2 (two) times daily.   rizatriptan (MAXALT-MLT) 10 MG disintegrating tablet Take 10 mg by mouth as needed for migraine. May repeat in 2 hours if needed   temazepam (RESTORIL) 30 MG capsule Take 1 capsule (30 mg total) by mouth at bedtime as needed for sleep.   traMADol (ULTRAM) 50 MG tablet One tablet every six hours as needed for pain.   [DISCONTINUED] Dulaglutide (TRULICITY) A999333 0000000 SOPN Inject 0.75 mg into the skin once a week.   [DISCONTINUED] gabapentin (NEURONTIN) 100 MG capsule Take 1 capsule (100 mg total) by mouth 2 (two) times daily.   [DISCONTINUED] metFORMIN  (GLUCOPHAGE) 1000 MG tablet Take 1 tablet (1,000 mg total) by mouth 2 (two) times daily with a meal.   No facility-administered encounter medications on file as of 12/30/2020.    ALLERGIES: Allergies  Allergen Reactions   Bactrim Itching, Nausea And Vomiting and Other (See Comments)    Redness    VACCINATION STATUS: Immunization History  Administered Date(s) Administered   Moderna Sars-Covid-2 Vaccination 07/21/2019, 08/22/2019   Pneumococcal Polysaccharide-23 01/09/2020    Diabetes She presents for her follow-up diabetic visit. She has type 2 diabetes mellitus. Onset time: She was diagnosed at approximate age of 59 years. Her disease course has been improving (She underwent sleeve gastrectomy on January 08, 2020, and she has lost 14 pounds since her surgery.). There are no hypoglycemic associated symptoms. Pertinent negatives for hypoglycemia include no confusion, headaches, pallor or seizures. Associated symptoms include fatigue. Pertinent negatives for diabetes include no chest pain, no polydipsia, no polyphagia and no polyuria. There are no hypoglycemic complications. Symptoms are improving. Diabetic complications include heart disease. Risk factors for coronary artery disease include dyslipidemia, family history, diabetes mellitus, hypertension, obesity, sedentary lifestyle and post-menopausal. Her weight is decreasing steadily (She has lost 55 lbs overall since her sleeve gastrectomy.). She is following a generally unhealthy diet. When asked about meal planning, she reported none. She has not had a previous visit with a dietitian. She never participates in exercise. Her home blood glucose trend is decreasing steadily. Her breakfast blood glucose range is generally 110-130 mg/dl. Her lunch blood glucose range is generally 110-130 mg/dl. Her dinner blood glucose range is generally 110-130 mg/dl. Her bedtime blood glucose range is generally 110-130 mg/dl. Her overall blood glucose range is  110-130  mg/dl. (She presents with her CGM device showing 94% time range, 6% slightly above target.  No hypoglycemia.  Her point-of-care A1c is 5.9%.  She has lost 7 more pounds, total of 62 pounds after her sleeve gastrectomy.    ) An ACE inhibitor/angiotensin II receptor blocker is being taken. Eye exam is current.  Hypertension This is a chronic problem. The current episode started more than 1 year ago. The problem is uncontrolled. Pertinent negatives include no chest pain, headaches, palpitations or shortness of breath. Risk factors for coronary artery disease include diabetes mellitus, obesity, sedentary lifestyle and post-menopausal state. Past treatments include ACE inhibitors.   Review of systems: Limited as above. Objective:    Ht '5\' 6"'$  (1.676 m)   Wt 247 lb 9.6 oz (112.3 kg)   BMI 39.96 kg/m   Wt Readings from Last 3 Encounters:  12/30/20 247 lb 9.6 oz (112.3 kg)  08/21/20 254 lb (115.2 kg)  04/22/20 263 lb (119.3 kg)     Physical Exam- Limited  Constitutional:  Body mass index is 39.96 kg/m. , not in acute distress, normal state of mind    CMP     Component Value Date/Time   NA 145 (H) 08/13/2020 0813   K 4.4 08/13/2020 0813   CL 102 08/13/2020 0813   CO2 22 08/13/2020 0813   GLUCOSE 97 08/13/2020 0813   GLUCOSE 116 (H) 01/01/2020 1356   BUN 19 08/13/2020 0813   CREATININE 0.61 08/13/2020 0813   CREATININE 0.74 03/23/2011 0400   CALCIUM 9.4 08/13/2020 0813   PROT 6.8 08/13/2020 0813   ALBUMIN 4.3 08/13/2020 0813   AST 20 08/13/2020 0813   ALT 15 08/13/2020 0813   ALKPHOS 86 08/13/2020 0813   BILITOT 0.3 08/13/2020 0813   GFRNONAA >60 01/01/2020 1356   GFRAA >60 01/01/2020 1356   Recent Results (from the past 2160 hour(s))  HgB A1c     Status: None   Collection Time: 12/30/20  3:54 PM  Result Value Ref Range   Hemoglobin A1C     HbA1c POC (<> result, manual entry)     HbA1c, POC (prediabetic range)     HbA1c, POC (controlled diabetic range) 5.9 0.0 -  7.0 %     Assessment & Plan:   1. Uncontrolled type 2 diabetes mellitus with hyperglycemia ([place])  - [name] has currently uncontrolled symptomatic type 2 DM since  60 years of age.  She presents with her CGM device showing 94% time range, 6% slightly above target.  No hypoglycemia.  Her point-of-care A1c is 5.9%.  She has lost 7 more pounds, total of 62 pounds after her sleeve gastrectomy.    - Recent labs reviewed. - I had a long discussion with her about the progressive nature of diabetes and the pathology behind its complications. -her diabetes is complicated by morbid obesity, sedentary life, hypertension and she remains at a high risk for more acute and chronic complications which include CAD, CVA, CKD, retinopathy, and neuropathy. These are all discussed in detail with her. -She is currently being prepared for sleeve gastrectomy at the end of this month.  - I have counseled her on diet  and weight management  by adopting a carbohydrate restricted/protein rich diet. Patient is encouraged to switch to  unprocessed or minimally processed     complex starch and increased protein intake (animal or plant source), fruits, and vegetables. -  she is advised to stick to a routine mealtimes to eat 3 meals  a day and avoid unnecessary snacks ( to snack only to correct hypoglycemia).   - she acknowledges that there is a room for improvement in her food and drink choices. - Suggestion is made for her to avoid simple carbohydrates  from her diet including Cakes, Sweet Desserts, Ice Cream, Soda (diet and regular), Sweet Tea, Candies, Chips, Cookies, Store Bought Juices, Alcohol in Excess of  1-2 drinks a day, Artificial Sweeteners,  Coffee Creamer, and "Sugar-free" Products, Lemonade. This will help patient to have more stable blood glucose profile and potentially avoid unintended weight gain.  - I have approached her with the following individualized plan to manage  her diabetes and patient  agrees:   -She is responding to the gastrectomy already with near target glycemic profile.  She has lost 62 pounds overall.  In light of her A1c at 5.9%, she is advised to stay away from insulin.  She is also considered for discontinuation of metformin.  She will continue to benefit from low-dose Trulicity, A999333 mg subcutaneously weekly.   Side effects and precautions discussed with her.  She was treated with [place] before.    - Specific targets for  A1c;  LDL, HDL,  and Triglycerides were discussed with the patient.   2) Blood Pressure /Hypertension: -Her blood pressure is controlled to target.   she is advised to continue her current medications including lisinopril 5 mg p.o. daily with breakfast , lisinopril will be increased to  10 mg for next refill.  3) Lipids/Hyperlipidemia:     -Her recent lipid panel showed uncontrolled LDL at 101.  She will be considered for statin therapy after next visit.    4)  Weight/Diet: Her BMI is 39.9-lost 62 pounds  since sleeve gastrectomy.  She is expected to continue to lose  More weight.    5) Chronic Care/Health Maintenance:  -she  is on ACEI/ARB and  is encouraged to initiate and continue to follow up with Ophthalmology, Dentist,  Podiatrist at least yearly or according to recommendations, and advised to  stay away from smoking. I have recommended yearly flu vaccine and pneumonia vaccine at least every 5 years; moderate intensity exercise for up to 150 minutes weekly; and  sleep for at least 7 hours a day.  Her screening ABI was normal in December 2022, This study will be repeated in 5 years-December 2026, or sooner if needed.  - she is  advised to maintain close follow up with Sharilyn Sites, MD for primary care needs, as well as her other providers for optimal and coordinated care.    I spent 41 minutes in the care of the patient today including review of labs from [facility], Lipids, Thyroid Function, Hematology (current and previous including  abstractions from other facilities); face-to-face time discussing  her blood glucose readings/logs, discussing hypoglycemia and hyperglycemia episodes and symptoms, medications doses, her options of short and long term treatment based on the latest standards of care / guidelines;  discussion about incorporating lifestyle medicine;  and documenting the encounter.    Please refer to Patient Instructions for Blood Glucose Monitoring and Insulin/Medications Dosing Guide"  in media tab for additional information. Please  also refer to " Patient Self Inventory" in the Media  tab for reviewed elements of pertinent patient history.  Francesca Jewett participated in the discussions, expressed understanding, and voiced agreement with the above plans.  All questions were answered to her satisfaction. she is encouraged to contact clinic should she have any questions or concerns prior  to her return visit.   Follow up plan: - Return in about 6 months (around 07/02/2021) for F/U with Pre-visit Labs, A1c -NV.  Glade Lloyd, MD [facility] [address] [place], [place] [address] Phone: [phone]  Fax: [phone]    12/30/2020, 4:43 PM  This note was partially dictated with voice recognition software. Similar sounding words can be transcribed inadequately or may not  be corrected upon review.

## 2020-12-30 NOTE — Progress Notes (Signed)
Virtual Visit via Telephone Note  I connected with [name] on 12/30/20 at  1:20 PM EDT by telephone and verified that I am speaking with the correct person using two identifiers.  Location: Patient: home Provider: office   I discussed the limitations, risks, security and privacy concerns of performing an evaluation and management service by telephone and the availability of in person appointments. I also discussed with the patient that there may be a patient responsible charge related to this service. The patient expressed understanding and agreed to proceed.       I discussed the assessment and treatment plan with the patient. The patient was provided an opportunity to ask questions and all were answered. The patient agreed with the plan and demonstrated an understanding of the instructions.   The patient was advised to call back or seek an in-person evaluation if the symptoms worsen or if the condition fails to improve as anticipated.  I provided 15 minutes of non-face-to-face time during this encounter.   Levonne Spiller, MD  [facility] MD/PA/NP OP Progress Note  12/30/2020 1:46 PM [name]  MRN:  [id number]  Chief Complaint:  Chief Complaint   Anxiety; Depression; Follow-up    HPI: This patient is a 60 year old married white female who lives with her husband in [place].  She has 2 sons who live outside the home.  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  The patient returns for follow-up regarding her depression and anxiety.  She was last seen 3 months ago.  She states that she is doing fairly well.  She is going to have to have left knee replacement.  After having bariatric surgery last year she has finally lost enough weight to be eligible for the surgery.  She is worried about not being able to work for a while because she really enjoys her job but hopefully she will be allowed to come back.  Her mood is  fairly good although she and her husband still have other difficulties.  Her sleep is interrupted due to her knee pain.  She does think the temazepam helps her get some sleep.  She denies thoughts of self-harm or suicidal ideation Visit Diagnosis:    ICD-10-CM   1. Bipolar 1 disorder, mixed, moderate ([place])  F31.62       Past Psychiatric History: long term outpat treatment  Past Medical History:  Past Medical History:  Diagnosis Date   Adenomatous polyp 2009   Anemia 2003 after surgery needed 1 unit blood   Anxiety    Arthritis    Bipolar 1 disorder ([place])    Constipation    Depression    Diabetes mellitus    Diabetes mellitus, type II ([place])    Diverticula of colon 2009   Generalized headaches    GERD (gastroesophageal reflux disease)    History of hiatal hernia    small   Hypertension    Migraine    Neuropathy    both feet   Obesity    Pelvic floor dysfunction    abnormal anorectal manometry at [facility] in 2009   Plantar fasciitis both feet   Sleep apnea    cpap setting of 3.5    Past Surgical History:  Procedure Laterality Date   ABDOMINAL HYSTERECTOMY     compete   CATARACT EXTRACTION W/PHACO Right 02/15/2017   Procedure: CATARACT EXTRACTION PHACO AND INTRAOCULAR LENS PLACEMENT ([place]);  Surgeon: Rutherford Guys, MD;  [facility]: [facility];  Service: Ophthalmology;  Laterality: Right;  CDE: 4.18   CATARACT EXTRACTION W/PHACO Left 03/01/2017   Procedure: CATARACT EXTRACTION PHACO AND INTRAOCULAR LENS PLACEMENT ([facility]);  Surgeon: Rutherford Guys, MD;  Location: [facility];  Service: Ophthalmology;  Laterality: Left;  CDE: 2.56   COLON RESECTION    03/30/2002   with end-colostomy and Hartmann's pouch   COLON SURGERY     complicated diverticulitis requiring sigmoid resection with colostomy and subsequent takedown   COLONOSCOPY  12/11/2007   Dr. Gala Romney- marginal prep, normal rectum pancolonic diverticula, adenomatous polyp   COLONOSCOPY  08/28/2003      Wide open colonic anastomosis/Scattered  diverticula noted throughout colon/ Small external hemorrhoids   COLONOSCOPY  04/2012   [facility]: hyperplastic polyps, diverticulosis, ileocolonic anastomosis.   COLONOSCOPY WITH PROPOFOL N/A 06/07/2016   Procedure: COLONOSCOPY WITH PROPOFOL;  Surgeon: Daneil Dolin, MD;  Location: [facility];  Service: Endoscopy;  Laterality: N/A;  7:30 am   COLOSTOMY CLOSURE  07/10/2002   ESOPHAGOGASTRODUODENOSCOPY N/A 01/08/2020   Procedure: UPPER GASTROINTESTINAL ENDOSCOPY;  Surgeon: Alphonsa Overall, MD;  Location: [facility];  Service: General;  Laterality: N/A;   ESOPHAGOGASTRODUODENOSCOPY (EGD) WITH PROPOFOL N/A 08/23/2016   Procedure: ESOPHAGOGASTRODUODENOSCOPY (EGD) WITH PROPOFOL;  Surgeon: Alphonsa Overall, MD;  Location: [facility];  Service: General;  Laterality: N/A;   FLEXIBLE SIGMOIDOSCOPY  01/20/2012   RMR: incomplete/attempted colonoscopy. Inadequate prep precluded examination   HEEL SPUR SURGERY Left 09/11/2013   both have been done   KNEE ARTHROSCOPY  02/04/2004    left knee/partial medial meniscectomy.   KNEE SURGERY     3 arthroscopic  2 on left 1 on right   LAPAROSCOPIC GASTRIC BANDING  2009   LAPAROSCOPIC GASTRIC SLEEVE RESECTION N/A 01/08/2020   Procedure: LAPAROSCOPIC SLEEVE GASTRECTOMY;  Surgeon: Alphonsa Overall, MD;  [facility];  Service: General;  Laterality: N/A;   LAPAROSCOPIC SALPINGOOPHERECTOMY  03/30/2002   TUBAL LIGATION      Family Psychiatric History: see below  Family History:  Family History  Problem Relation Age of Onset   Ovarian cancer Mother    Heart disease Mother    Anxiety disorder Mother    Cirrhosis Father        deceased age 55   Alcohol abuse Father    Liver cancer Cousin        age 57, deceased   Drug abuse Cousin    ADD / ADHD Son    Anxiety disorder Sister    Dementia Maternal Grandfather    Colon cancer Other        aunt, deceased age 14   Breast cancer Other        aunt, deceased age 57   Bipolar disorder Neg Hx    Depression Neg Hx    OCD  Neg Hx    Paranoid behavior Neg Hx    Schizophrenia Neg Hx    Seizures Neg Hx    Sexual abuse Neg Hx    Physical abuse Neg Hx     Social History:  Social History   Socioeconomic History   Marital status: Married    Spouse name: Not on file   Number of children: 2   Years of education: college   Highest education level: Not on file  Occupational History   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    Employer: DISABLED  Tobacco Use   Smoking status: Never   Smokeless tobacco: Never  Vaping Use   Vaping  Use: Never used  Substance and Sexual Activity   Alcohol use: Not Currently    Comment: occasionally wine   Drug use: No   Sexual activity: Not Currently    Birth control/protection: Surgical  Other Topics Concern   Not on file  Social History Narrative   Not on file   Social Determinants of Health   Financial Resource Strain: Not on file  Food Insecurity: Not on file  Transportation Needs: Not on file  Physical Activity: Not on file  Stress: Not on file  Social Connections: Not on file    Allergies:  Allergies  Allergen Reactions   Bactrim Itching, Nausea And Vomiting and Other (See Comments)    Redness    Metabolic Disorder Labs: Lab Results  Component Value Date   HGBA1C 6.0 08/21/2020   MPG 232 01/01/2020   No results found for: PROLACTIN Lab Results  Component Value Date   CHOL 175 08/13/2020   TRIG 122 08/13/2020   HDL 52 08/13/2020   CHOLHDL 3.4 08/13/2020   LDLCALC 101 (H) 08/13/2020   LDLCALC 119 (H) 12/06/2019   Lab Results  Component Value Date   TSH 1.310 08/13/2020   TSH 1.560 12/06/2019    Therapeutic Level Labs: No results found for: LITHIUM No results found for: VALPROATE No components found for:  CBMZ  Current Medications: Current Outpatient Medications  Medication Sig Dispense Refill   ALPRAZolam (XANAX) 1 MG tablet Take 1 tablet (1 mg total) by mouth 3 (three) times daily as needed for anxiety. 270 tablet 1    ARIPiprazole (ABILIFY) 10 MG tablet Take 1 tablet (10 mg total) by mouth at bedtime. 90 tablet 2   ascorbic acid (VITAMIN C) 500 MG tablet Take 500 mg by mouth daily.     Blood Glucose Monitoring Suppl (FREESTYLE LITE) DEVI Use to measure glucose 4 times a day 1 each 0   Continuous Blood Gluc Receiver (FREESTYLE LIBRE 2 READER) DEVI As directed 1 each 0   Continuous Blood Gluc Sensor (FREESTYLE LIBRE 2 SENSOR) MISC 1 Piece by Does not apply route every 14 (fourteen) days. 2 each 3   cycloSPORINE (RESTASIS) 0.05 % ophthalmic emulsion Place 2 drops into both eyes 2 (two) times daily.      Dulaglutide (TRULICITY) [id number] 0000000 SOPN Inject 0.75 mg into the skin once a week. 6 mL 1   escitalopram (LEXAPRO) 20 MG tablet Take 1 tablet (20 mg total) by mouth daily. 90 tablet 2   fluticasone (FLONASE) 50 MCG/ACT nasal spray Place 2 sprays into the nose daily as needed for allergies.      gabapentin (NEURONTIN) 100 MG capsule Take 1 capsule (100 mg total) by mouth 2 (two) times daily. 180 capsule 0   glucose blood test strip Test 4 times a day, she has freestyle lite meter 150 each 2   Insulin Pen Needle (B-D ULTRAFINE III SHORT PEN) 31G X 8 MM MISC 1 each by Does not apply route as directed. 100 each 3   lisinopril (ZESTRIL) 5 MG tablet Take 5 mg by mouth daily.     loratadine (CLARITIN) 10 MG tablet Take 10 mg by mouth daily.     meclizine (ANTIVERT) 25 MG tablet Take 25 mg by mouth 2 (two) times daily as needed for dizziness.   2   Menthol, Topical Analgesic, (ICY HOT EX) Apply 1 application topically 4 (four) times daily as needed (joint pain.).     metFORMIN (GLUCOPHAGE) 1000 MG tablet Take 1 tablet (1,000  mg total) by mouth 2 (two) times daily with a meal. 180 tablet 1   methocarbamol (ROBAXIN) 500 MG tablet Take 500 mg by mouth 3 (three) times daily as needed for muscle spasms.      Multiple Vitamin (MULTIVITAMIN WITH MINERALS) TABS tablet Take 1 tablet by mouth daily.     ondansetron (ZOFRAN ODT)  4 MG disintegrating tablet Take 1 tablet (4 mg total) by mouth every 8 (eight) hours as needed for nausea or vomiting. 10 tablet 0   pantoprazole (PROTONIX) 40 MG tablet Take 1 tablet (40 mg total) by mouth 2 (two) times daily. (Patient taking differently: Take 40 mg by mouth daily before breakfast. ) 180 tablet 3   Probiotic CHEW Chew 1-2 capsules by mouth daily. *Hold for diarrhea* (Gummy)     Propylene Glycol-Glycerin (SOOTHE) 0.6-0.6 % SOLN Place 1 drop into both eyes 2 (two) times daily.     rizatriptan (MAXALT-MLT) 10 MG disintegrating tablet Take 10 mg by mouth as needed for migraine. May repeat in 2 hours if needed     temazepam (RESTORIL) 30 MG capsule Take 1 capsule (30 mg total) by mouth at bedtime as needed for sleep. 90 capsule 2   traMADol (ULTRAM) 50 MG tablet One tablet every six hours as needed for pain. 40 tablet 3   No current facility-administered medications for this visit.     Musculoskeletal: Strength & Muscle Tone: within normal limits Gait & Station: normal Patient leans: N/A  Psychiatric Specialty Exam: Review of Systems  Musculoskeletal:  Positive for arthralgias, back pain and joint swelling.  All other systems reviewed and are negative.  There were no vitals taken for this visit.There is no height or weight on file to calculate BMI.  General Appearance: NA  Eye Contact:  NA  Speech:  Clear and Coherent  Volume:  Normal  Mood:  Euthymic  Affect:  NA  Thought Process:  Goal Directed  Orientation:  Full (Time, Place, and Person)  Thought Content: WDL   Suicidal Thoughts:  No  Homicidal Thoughts:  No  Memory:  Immediate;   Good Recent;   Good Remote;   Good  Judgement:  Good  Insight:  Good  Psychomotor Activity:  Normal  Concentration:  Concentration: Good and Attention Span: Good  Recall:  Good  Fund of Knowledge: Good  Language: Good  Akathisia:  No  Handed:  Right  AIMS (if indicated): not done  Assets:  Communication Skills Desire for  Improvement Physical Health Resilience Social Support Talents/Skills  ADL's:  Intact  Cognition: WNL  Sleep:  Fair   Screenings: PHQ2-9    Flowsheet Row Video Visit from 12/30/2020 in [facility] Video Visit from 08/14/2020 in [facility] from 04/20/2019 in [facility] Office Visit from 07/13/2018 in [facility]  PHQ-2 Total Score 0 0 0 0      Flowsheet Row Video Visit from 12/30/2020 in [facility] Video Visit from 08/14/2020 in [facility] No Risk No Risk        Assessment and Plan: This patient is a 60 year old female with a history of bipolar disorder and PTSD.  Despite her knee issues she is doing well in terms of mood.  She will continue Xanax 1 mg 3 times daily for anxiety, Lexapro 20 mg daily for depression, Abilify 10 mg at bedtime for mood stabilization and Restoril 30 mg  at bedtime for sleep.  She will return to see me in 3 months   Levonne Spiller, MD 12/30/2020, 1:46 PM

## 2020-12-30 NOTE — Patient Instructions (Signed)

## 2021-01-01 DIAGNOSIS — E1165 Type 2 diabetes mellitus with hyperglycemia: Secondary | ICD-10-CM | POA: Diagnosis not present

## 2021-01-06 DIAGNOSIS — M25562 Pain in left knee: Secondary | ICD-10-CM | POA: Diagnosis not present

## 2021-01-06 DIAGNOSIS — M1711 Unilateral primary osteoarthritis, right knee: Secondary | ICD-10-CM | POA: Diagnosis not present

## 2021-01-06 DIAGNOSIS — M25561 Pain in right knee: Secondary | ICD-10-CM | POA: Diagnosis not present

## 2021-01-26 DIAGNOSIS — Z01812 Encounter for preprocedural laboratory examination: Secondary | ICD-10-CM | POA: Diagnosis not present

## 2021-01-26 DIAGNOSIS — D518 Other vitamin B12 deficiency anemias: Secondary | ICD-10-CM | POA: Diagnosis not present

## 2021-01-26 DIAGNOSIS — Z23 Encounter for immunization: Secondary | ICD-10-CM | POA: Diagnosis not present

## 2021-01-27 DIAGNOSIS — G4733 Obstructive sleep apnea (adult) (pediatric): Secondary | ICD-10-CM | POA: Diagnosis not present

## 2021-01-27 DIAGNOSIS — G4730 Sleep apnea, unspecified: Secondary | ICD-10-CM | POA: Diagnosis not present

## 2021-01-29 DIAGNOSIS — S058X2A Other injuries of left eye and orbit, initial encounter: Secondary | ICD-10-CM | POA: Diagnosis not present

## 2021-01-29 DIAGNOSIS — H33322 Round hole, left eye: Secondary | ICD-10-CM | POA: Diagnosis not present

## 2021-02-02 DIAGNOSIS — E1165 Type 2 diabetes mellitus with hyperglycemia: Secondary | ICD-10-CM | POA: Diagnosis not present

## 2021-02-11 DIAGNOSIS — H43812 Vitreous degeneration, left eye: Secondary | ICD-10-CM | POA: Diagnosis not present

## 2021-02-11 DIAGNOSIS — H33322 Round hole, left eye: Secondary | ICD-10-CM | POA: Diagnosis not present

## 2021-02-13 ENCOUNTER — Other Ambulatory Visit (HOSPITAL_COMMUNITY): Payer: Self-pay | Admitting: Family Medicine

## 2021-02-13 DIAGNOSIS — Z1231 Encounter for screening mammogram for malignant neoplasm of breast: Secondary | ICD-10-CM

## 2021-02-26 DIAGNOSIS — M722 Plantar fascial fibromatosis: Secondary | ICD-10-CM | POA: Diagnosis not present

## 2021-02-26 DIAGNOSIS — M79671 Pain in right foot: Secondary | ICD-10-CM | POA: Diagnosis not present

## 2021-03-03 NOTE — H&P (Signed)
Patient's anticipated LOS is less than 2 midnights, meeting these requirements: - Younger than 65 - Lives within 1 hour of care - Has a competent adult at home to recover with post-op recover - NO history of  - Chronic pain requiring opiods  - Diabetes  - Coronary Artery Disease  - Heart failure  - Heart attack  - Stroke  - DVT/VTE  - Cardiac arrhythmia  - Respiratory Failure/COPD  - Renal failure  - Anemia  - Advanced Liver disease     [name] is an 60 y.o. female.    Chief Complaint: left knee pain  HPI: Pt is a 60 y.o. female complaining of left knee pain for multiple years. Pain had continually increased since the beginning. X-rays in the clinic show end-stage arthritic changes of the left knee. Pt has tried various conservative treatments which have failed to alleviate their symptoms, including injections and therapy. Various options are discussed with the patient. Risks, benefits and expectations were discussed with the patient. Patient understand the risks, benefits and expectations and wishes to proceed with surgery.   PCP:  Sharilyn Sites, MD  D/C Plans: Home  PMH: Past Medical History:  Diagnosis Date   Adenomatous polyp 2009   Anemia 2003 after surgery needed 1 unit blood   Anxiety    Arthritis    Bipolar 1 disorder ([place])    Constipation    Depression    Diabetes mellitus    Diabetes mellitus, type II ([place])    Diverticula of colon 2009   Generalized headaches    GERD (gastroesophageal reflux disease)    History of hiatal hernia    small   Hypertension    Migraine    Neuropathy    both feet   Obesity    Pelvic floor dysfunction    abnormal anorectal [facility]ometry at [facility] in 2009   Plantar fasciitis both feet   Sleep apnea    cpap setting of 3.5    PSH: Past Surgical History:  Procedure Laterality Date   ABDOMINAL HYSTERECTOMY     compete   CATARACT EXTRACTION W/PHACO Right 02/15/2017   Procedure: CATARACT EXTRACTION PHACO AND INTRAOCULAR  LENS PLACEMENT ([place]);  Surgeon: Rutherford Guys, MD;  Location: [facility];  Service: Ophthalmology;  Laterality: Right;  CDE: 4.18   CATARACT EXTRACTION W/PHACO Left 03/01/2017   Procedure: CATARACT EXTRACTION PHACO AND INTRAOCULAR LENS PLACEMENT ([facility]);  Surgeon: Rutherford Guys, MD;  Location: [facility];  Service: Ophthalmology;  Laterality: Left;  CDE: 2.56   COLON RESECTION    03/30/2002   with end-colostomy and Hartmann's pouch   COLON SURGERY     complicated diverticulitis requiring sigmoid resection with colostomy and subsequent takedown   COLONOSCOPY  12/11/2007   Dr. Gala Romney- marginal prep, normal rectum pancolonic diverticula, adenomatous polyp   COLONOSCOPY  08/28/2003      Wide open colonic anastomosis/Scattered diverticula noted throughout colon/ Small external hemorrhoids   COLONOSCOPY  04/2012   [facility]: hyperplastic polyps, diverticulosis, ileocolonic anastomosis.   COLONOSCOPY WITH PROPOFOL N/A 06/07/2016   Procedure: COLONOSCOPY WITH PROPOFOL;  Surgeon: Daneil Dolin, MD;  [facility];  Service: Endoscopy;  Laterality: N/A;  7:30 am   COLOSTOMY CLOSURE  07/10/2002   ESOPHAGOGASTRODUODENOSCOPY N/A 01/08/2020   Procedure: UPPER GASTROINTESTINAL ENDOSCOPY;  Surgeon: Alphonsa Overall, MD;  [facility];  Service: General;  Laterality: N/A;   ESOPHAGOGASTRODUODENOSCOPY (EGD) WITH PROPOFOL N/A 08/23/2016   Procedure: ESOPHAGOGASTRODUODENOSCOPY (EGD) WITH PROPOFOL;  Surgeon: Alphonsa Overall, MD;  [facility];  Service: General;  Laterality: N/A;   FLEXIBLE SIGMOIDOSCOPY  01/20/2012   RMR: incomplete/attempted colonoscopy. Inadequate prep precluded examination   HEEL SPUR SURGERY Left 09/11/2013   both have been done   KNEE ARTHROSCOPY  02/04/2004    left knee/partial medial meniscectomy.   KNEE SURGERY     3 arthroscopic  2 on left 1 on right   LAPAROSCOPIC GASTRIC BANDING  2009   LAPAROSCOPIC GASTRIC SLEEVE RESECTION N/A 01/08/2020   Procedure: LAPAROSCOPIC SLEEVE GASTRECTOMY;   Surgeon: Alphonsa Overall, MD;  [facility];  Service: General;  Laterality: N/A;   LAPAROSCOPIC SALPINGOOPHERECTOMY  03/30/2002   TUBAL LIGATION      Social History:  reports that she has never smoked. She has never used smokeless tobacco. She reports that she does not currently use alcohol. She reports that she does not use drugs.  Allergies:  Allergies  Allergen Reactions   Bactrim Itching, Nausea And Vomiting and Other (See Comments)    Redness    Medications: No current facility-administered medications for this encounter.   Current Outpatient Medications  Medication Sig Dispense Refill   ALPRAZolam (XANAX) 1 MG tablet Take 1 tablet (1 mg total) by mouth 3 (three) times daily as needed for anxiety. 270 tablet 1   ARIPiprazole (ABILIFY) 10 MG tablet Take 1 tablet (10 mg total) by mouth at bedtime. 90 tablet 2   ascorbic acid (VITAMIN C) 500 MG tablet Take 500 mg by mouth daily.     Blood Glucose Monitoring Suppl (FREESTYLE LITE) DEVI Use to measure glucose 4 times a day 1 each 0   Continuous Blood Gluc Receiver (FREESTYLE LIBRE 2 READER) DEVI As directed 1 each 0   Continuous Blood Gluc Sensor (FREESTYLE LIBRE 2 SENSOR) MISC 1 Piece by Does not apply route every 14 (fourteen) days. 2 each 3   cycloSPORINE (RESTASIS) 0.05 % ophthalmic emulsion Place 2 drops into both eyes 2 (two) times daily.      Dulaglutide (TRULICITY) 2.09 OB/0.9GG SOPN Inject 0.75 mg into the skin once a week. 6 mL 1   escitalopram (LEXAPRO) 20 MG tablet Take 1 tablet (20 mg total) by mouth daily. 90 tablet 2   fluticasone (FLONASE) 50 MCG/ACT nasal spray Place 2 sprays into the nose daily as needed for allergies.      gabapentin (NEURONTIN) 100 MG capsule Take 1 capsule (100 mg total) by mouth 2 (two) times daily. 180 capsule 0   glucose blood test strip Test 4 times a day, she has freestyle lite meter 150 each 2   Insulin Pen Needle (B-D ULTRAFINE III SHORT PEN) 31G X 8 MM MISC 1 each by Does not apply  route as directed. 100 each 3   lisinopril (ZESTRIL) 5 MG tablet Take 5 mg by mouth daily.     loratadine (CLARITIN) 10 MG tablet Take 10 mg by mouth daily.     meclizine (ANTIVERT) 25 MG tablet Take 25 mg by mouth 2 (two) times daily as needed for dizziness.   2   Menthol, Topical Analgesic, (ICY HOT EX) Apply 1 application topically 4 (four) times daily as needed (joint pain.).     methocarbamol (ROBAXIN) 500 MG tablet Take 500 mg by mouth 3 (three) times daily as needed for muscle spasms.      Multiple Vitamin (MULTIVITAMIN WITH MINERALS) TABS tablet Take 1 tablet by mouth daily.     ondansetron (ZOFRAN ODT) 4 MG disintegrating tablet Take 1 tablet (4 mg total) by  mouth every 8 (eight) hours as needed for nausea or vomiting. 10 tablet 0   pantoprazole (PROTONIX) 40 MG tablet Take 1 tablet (40 mg total) by mouth 2 (two) times daily. (Patient taking differently: Take 40 mg by mouth daily before breakfast. ) 180 tablet 3   Probiotic CHEW Chew 1-2 capsules by mouth daily. *Hold for diarrhea* (Gummy)     Propylene Glycol-Glycerin (SOOTHE) 0.6-0.6 % SOLN Place 1 drop into both eyes 2 (two) times daily.     rizatriptan (MAXALT-MLT) 10 MG disintegrating tablet Take 10 mg by mouth as needed for migraine. May repeat in 2 hours if needed     temazepam (RESTORIL) 30 MG capsule Take 1 capsule (30 mg total) by mouth at bedtime as needed for sleep. 90 capsule 2   traMADol (ULTRAM) 50 MG tablet One tablet every six hours as needed for pain. 40 tablet 3    No results found for this or any previous visit (from the past 48 hour(s)). No results found.  ROS: Pain with rom of the left lower extremity  Physical Exam: Alert and oriented 60 y.o. female in no acute distress Cranial nerves 2-12 intact Cervical spine: full rom with no tenderness, nv intact distally Chest: active breath sounds bilaterally, no wheeze rhonchi or rales Heart: regular rate and rhythm, no murmur Abd: non tender non distended with  active bowel sounds Hip is stable with rom  Left knee painful rom with crepitus Nv intact distally No rashes or edema Antalgic gait  Assessment/Plan Assessment: left knee end stage osteoarthritis  Plan:  Patient will undergo a left total knee by Dr. Veverly Fells at [place] Risks benefits and expectations were discussed with the patient. Patient understand risks, benefits and expectations and wishes to proceed. Preoperative templating of the joint replacement has been completed, documented, and submitted to the Operating Room personnel in order to optimize intra-operative equipment management.   Merla Riches PA-C, [facility] is now [employer] [address] [place], [place], [place] [address] Phone: [phone] [email or web address] Facebook  [employer]

## 2021-03-05 DIAGNOSIS — M47816 Spondylosis without myelopathy or radiculopathy, lumbar region: Secondary | ICD-10-CM | POA: Diagnosis not present

## 2021-03-05 DIAGNOSIS — E1165 Type 2 diabetes mellitus with hyperglycemia: Secondary | ICD-10-CM | POA: Diagnosis not present

## 2021-03-11 ENCOUNTER — Ambulatory Visit: Payer: Medicare Other | Admitting: Adult Health

## 2021-03-12 ENCOUNTER — Telehealth (INDEPENDENT_AMBULATORY_CARE_PROVIDER_SITE_OTHER): Payer: Medicare Other | Admitting: Adult Health

## 2021-03-12 ENCOUNTER — Ambulatory Visit (HOSPITAL_COMMUNITY): Payer: TRICARE For Life (TFL)

## 2021-03-12 ENCOUNTER — Telehealth: Payer: Self-pay | Admitting: Adult Health

## 2021-03-12 DIAGNOSIS — Z9989 Dependence on other enabling machines and devices: Secondary | ICD-10-CM

## 2021-03-12 DIAGNOSIS — G4733 Obstructive sleep apnea (adult) (pediatric): Secondary | ICD-10-CM | POA: Diagnosis not present

## 2021-03-12 NOTE — Progress Notes (Signed)
DUE TO COVID-19 ONLY ONE VISITOR IS ALLOWED TO COME WITH YOU AND STAY IN THE WAITING ROOM ONLY DURING PRE OP AND PROCEDURE DAY OF SURGERY.  2 VISITOR  MAY VISIT WITH YOU AFTER SURGERY IN YOUR PRIVATE ROOM DURING VISITING HOURS ONLY!  YOU NEED TO HAVE A COVID 19 TEST ON_11/15/2022 @_  @_from  8am-3pm _____, THIS TEST MUST BE DONE BEFORE SURGERY,  Covid test is done at [place], [place] [address].  This is a drive thru.  No appt required. Please see map.                 Your procedure is scheduled on:       03/27/2021   Report to [facility] Main  Entrance   Report to admitting at   1030 AM     Call this number if you have problems the morning of surgery [phone]    REMEMBER: NO  SOLID FOOD CANDY OR GUM AFTER MIDNIGHT. CLEAR LIQUIDS UNTIL         . NOTHING BY MOUTH EXCEPT CLEAR LIQUIDS UNTIL    . PLEASE FINISH  G2 Lowersugar DRINK PER SURGEON ORDER  WHICH NEEDS TO BE COMPLETED AT     1015 am    .      CLEAR LIQUID DIET   Foods Allowed                                                                    Coffee and tea, regular and decaf                            Fruit ices (not with fruit pulp)                                      Iced Popsicles                                    Carbonated beverages, regular and diet                                    Cranberry, grape and apple juices Sports drinks like Gatorade Lightly seasoned clear broth or consume(fat free) Sugar, honey syrup ___________________________________________________________________      BRUSH YOUR TEETH MORNING OF SURGERY AND RINSE YOUR MOUTH OUT, NO CHEWING GUM CANDY OR MINTS.     Take these medicines the morning of surgery with A SIP OF WATER:   eye drops as usual, lexapro, abilify, gabapentin, claritin, protonix   DO NOT TAKE ANY DIABETIC MEDICATIONS DAY OF YOUR SURGERY                               You may not have any metal on your body including hair pins and               piercings  Do not wear jewelry, make-up, lotions, powders or perfumes,  deodorant             Do not wear nail polish on your fingernails.  Do not shave  48 hours prior to surgery.              Men may shave face and neck.   Do not bring valuables to the hospital. [place].  Contacts, dentures or bridgework may not be worn into surgery.  Leave suitcase in the car. After surgery it may be brought to your room.     Patients discharged the day of surgery will not be allowed to drive home. IF YOU ARE HAVING SURGERY AND GOING HOME THE SAME DAY, YOU MUST HAVE AN ADULT TO DRIVE YOU HOME AND BE WITH YOU FOR 24 HOURS. YOU MAY GO HOME BY TAXI OR UBER OR ORTHERWISE, BUT AN ADULT MUST ACCOMPANY YOU HOME AND STAY WITH YOU FOR 24 HOURS.  Name and phone number of your driver:  Special Instructions: N/A              Please read over the following fact sheets you were given: _____________________________________________________________________  [facility] - Preparing for Surgery Before surgery, you can play an important role.  Because skin is not sterile, your skin needs to be as free of germs as possible.  You can reduce the number of germs on your skin by washing with CHG (chlorahexidine gluconate) soap before surgery.  CHG is an antiseptic cleaner which kills germs and bonds with the skin to continue killing germs even after washing. Please DO NOT use if you have an allergy to CHG or antibacterial soaps.  If your skin becomes reddened/irritated stop using the CHG and inform your nurse when you arrive at Short Stay. Do not shave (including legs and underarms) for at least 48 hours prior to the first CHG shower.  You may shave your face/neck. Please follow these instructions carefully:  1.  Shower with CHG Soap the night before surgery and the  morning of Surgery.  2.  If you choose to wash your hair, wash your hair first as usual with your  normal   shampoo.  3.  After you shampoo, rinse your hair and body thoroughly to remove the  shampoo.                           4.  Use CHG as you would any other liquid soap.  You can apply chg directly  to the skin and wash                       Gently with a scrungie or clean washcloth.  5.  Apply the CHG Soap to your body ONLY FROM THE NECK DOWN.   Do not use on face/ open                           Wound or open sores. Avoid contact with eyes, ears mouth and genitals (private parts).                       Wash face,  Genitals (private parts) with your normal soap.             6.  Wash thoroughly, paying special attention to the area where your  surgery  will be performed.  7.  Thoroughly rinse your body with warm water from the neck down.  8.  DO NOT shower/wash with your normal soap after using and rinsing off  the CHG Soap.                9.  Pat yourself dry with a clean towel.            10.  Wear clean pajamas.            11.  Place clean sheets on your bed the night of your first shower and do not  sleep with pets. Day of Surgery : Do not apply any lotions/deodorants the morning of surgery.  Please wear clean clothes to the hospital/surgery center.  FAILURE TO FOLLOW THESE INSTRUCTIONS MAY RESULT IN THE CANCELLATION OF YOUR SURGERY PATIENT SIGNATURE_________________________________  NURSE SIGNATURE__________________________________  ________________________________________________________________________

## 2021-03-12 NOTE — Telephone Encounter (Signed)
..   Pt understands that although there may be some limitations with this type of visit, we will take all precautions to reduce any security or privacy concerns.  Pt understands that this will be treated like an in office visit and we will file with pt's insurance, and there may be a patient responsible charge related to this service. ? ?

## 2021-03-12 NOTE — Progress Notes (Addendum)
Anesthesia Review:  PCP: DR Sharilyn Sites - saw for clearance per pt [phone]- requested clearance and ov note.  LVMM.  Also spoke with Ashely at [facility] on chart dated 01/26/2021 LOV 01/26/21 on chart .  Cardiologist : none  Chest x-ray : EKG : Echo : Stress test: Cardiac Cath :  Activity level: cannot do a flight of stairs without difficulty  Sleep Study/ CPAP : has cpap  Fasting Blood Sugar :      / Checks Blood Sugar -- times a day:   Blood Thinner/ Instructions /Last Dose: ASA / Instructions/ Last Dose :   DM- type 2  Has Lester Kinsman monitor - checks glucose 4 x daily  Hgba1c- 03/17/21- 6.5  Has been off of htn meds since gastric surgery 12/2019 per pt -  Covid test - 03/24/21  Recent retina repair to left eye approx 3 weeks ago per pt

## 2021-03-12 NOTE — Progress Notes (Signed)
PATIENT: [name] DOB: 1961-02-02  REASON FOR VISIT: follow up HISTORY FROM: patient PRIMARY NEUROLOGIST:   Virtual Visit via Video Note  I connected with [name] on 03/12/21 at 10:30 AM EDT by a video enabled telemedicine application located remotely at [facility] and verified that I am speaking with the correct person using two identifiers who was located at their own home.   I discussed the limitations of evaluation and management by telemedicine and the availability of in person appointments. The patient expressed understanding and agreed to proceed.   PATIENT: [name] DOB: [birth year]  REASON FOR VISIT: follow up HISTORY FROM: patient  HISTORY OF PRESENT ILLNESS: Today 03/12/21;  [name] is a 60 year old female with a history of obstructive sleep apnea on CPAP.  She returns today for a virtual visit.  I am unable to obtain a wireless download.  She will need to bring her machine in.  She states that she will be having left knee surgery so she will not be able to come by our office for several weeks.  She states that she does try to use the machine however she has been in a lot of pain so she has not used it as consistently as she is not sleeping well.  She returns today for an evaluation.  HISTORY 03/12/21:  [name] is a 60 year old female with a history of obstructive sleep apnea on CPAP. She returns today for follow-up. We were unable to get a wireless download. The nurse is reaching out to her DME company. The patient states that she has been using it nightly. She did have gastric sleeve surgery and use the hospital machine during that time. She states that she has lost approximately 20 pounds and is feeling much better. She returns today for an evaluation.  REVIEW OF SYSTEMS: Out of a complete 14 system review of symptoms, the patient complains only of the following symptoms, and all other reviewed systems are  negative.  ALLERGIES: Allergies  Allergen Reactions   Bactrim Itching, Nausea And Vomiting and Rash    Bactrim brand name. Redness.    HOME MEDICATIONS: Outpatient Medications Prior to Visit  Medication Sig Dispense Refill   ALPRAZolam (XANAX) 1 MG tablet Take 1 tablet (1 mg total) by mouth 3 (three) times daily as needed for anxiety. 270 tablet 1   ARIPiprazole (ABILIFY) 10 MG tablet Take 1 tablet (10 mg total) by mouth at bedtime. 90 tablet 2   ascorbic acid (VITAMIN C) 500 MG tablet Take 500 mg by mouth daily.     Blood Glucose Monitoring Suppl (FREESTYLE LITE) DEVI Use to measure glucose 4 times a day 1 each 0   CALCIUM-VITAMIN D-VITAMIN K PO Take 1 tablet by mouth daily.     Continuous Blood Gluc Receiver (FREESTYLE LIBRE 2 READER) DEVI As directed 1 each 0   Continuous Blood Gluc Sensor (FREESTYLE LIBRE 2 SENSOR) MISC 1 Piece by Does not apply route every 14 (fourteen) days. 2 each 3   cycloSPORINE (RESTASIS) 0.05 % ophthalmic emulsion Place 2 drops into both eyes 2 (two) times daily.      diclofenac (VOLTAREN) 75 MG EC tablet Take 75 mg by mouth 2 (two) times daily.     Dulaglutide (TRULICITY) 6.44 IH/4.7QQ SOPN Inject 0.75 mg into the skin once a week. 6 mL 1   escitalopram (LEXAPRO) 20 MG tablet Take 1 tablet (20 mg total) by mouth daily. 90 tablet 2  fluticasone (FLONASE) 50 MCG/ACT nasal spray Place 2 sprays into the nose daily as needed for allergies.      gabapentin (NEURONTIN) 100 MG capsule Take 1 capsule (100 mg total) by mouth 2 (two) times daily. 180 capsule 0   glucose blood test strip Test 4 times a day, she has freestyle lite meter 150 each 2   HYDROcodone-acetaminophen (NORCO/VICODIN) 5-325 MG tablet Take 1 tablet by mouth every 6 (six) hours as needed for pain.     Insulin Pen Needle (B-D ULTRAFINE III SHORT PEN) 31G X 8 MM MISC 1 each by Does not apply route as directed. 100 each 3   loratadine (CLARITIN) 10 MG tablet Take 10 mg by mouth daily.     meclizine  (ANTIVERT) 25 MG tablet Take 25 mg by mouth 2 (two) times daily as needed for dizziness.   2   Menthol, Topical Analgesic, (ICY HOT EX) Apply 1 application topically 4 (four) times daily as needed (joint pain.).     methocarbamol (ROBAXIN) 500 MG tablet Take 500 mg by mouth 3 (three) times daily as needed for muscle spasms.      Multiple Vitamin (MULTIVITAMIN WITH MINERALS) TABS tablet Take 1 tablet by mouth daily.     pantoprazole (PROTONIX) 40 MG tablet Take 1 tablet (40 mg total) by mouth 2 (two) times daily. (Patient taking differently: Take 40 mg by mouth daily before breakfast.) 180 tablet 3   Probiotic Product (PROBIOTIC PO) Take 1 tablet by mouth daily.     Propylene Glycol-Glycerin 0.6-0.6 % SOLN Place 1 drop into both eyes 2 (two) times daily.     rizatriptan (MAXALT-MLT) 10 MG disintegrating tablet Take 10 mg by mouth as needed for migraine. May repeat in 2 hours if needed     temazepam (RESTORIL) 30 MG capsule Take 1 capsule (30 mg total) by mouth at bedtime as needed for sleep. 90 capsule 2   No facility-administered medications prior to visit.    PAST MEDICAL HISTORY: Past Medical History:  Diagnosis Date   Adenomatous polyp 2009   Anemia 2003 after surgery needed 1 unit blood   Anxiety    Arthritis    Bipolar 1 disorder ([place])    Constipation    Depression    Diabetes mellitus    Diabetes mellitus, type II ([place])    Diverticula of colon 2009   Generalized headaches    GERD (gastroesophageal reflux disease)    History of hiatal hernia    small   Hypertension    Migraine    Neuropathy    both feet   Obesity    Pelvic floor dysfunction    abnormal anorectal [facility]ometry at [facility] in 2009   Plantar fasciitis both feet   Sleep apnea    cpap setting of 3.5    PAST SURGICAL HISTORY: Past Surgical History:  Procedure Laterality Date   ABDOMINAL HYSTERECTOMY     compete   CATARACT EXTRACTION W/PHACO Right 02/15/2017   Procedure: CATARACT EXTRACTION PHACO AND INTRAOCULAR  LENS PLACEMENT ([place]);  Surgeon: Rutherford Guys, MD;  Location: [facility];  Service: Ophthalmology;  Laterality: Right;  CDE: 4.18   CATARACT EXTRACTION W/PHACO Left 03/01/2017   Procedure: CATARACT EXTRACTION PHACO AND INTRAOCULAR LENS PLACEMENT ([facility]);  Surgeon: Rutherford Guys, MD;  Location: [facility];  Service: Ophthalmology;  Laterality: Left;  CDE: 2.56   COLON RESECTION    03/30/2002   with end-colostomy and Hartmann's pouch   COLON SURGERY     complicated diverticulitis requiring  sigmoid resection with colostomy and subsequent takedown   COLONOSCOPY  12/11/2007   Dr. Gala Romney- marginal prep, normal rectum pancolonic diverticula, adenomatous polyp   COLONOSCOPY  08/28/2003      Wide open colonic anastomosis/Scattered diverticula noted throughout colon/ Small external hemorrhoids   COLONOSCOPY  04/2012   [facility]: hyperplastic polyps, diverticulosis, ileocolonic anastomosis.   COLONOSCOPY WITH PROPOFOL N/A 06/07/2016   Procedure: COLONOSCOPY WITH PROPOFOL;  Surgeon: Daneil Dolin, MD;  Location: [facility];  Service: Endoscopy;  Laterality: N/A;  7:30 am   COLOSTOMY CLOSURE  07/10/2002   ESOPHAGOGASTRODUODENOSCOPY N/A 01/08/2020   Procedure: UPPER GASTROINTESTINAL ENDOSCOPY;  Surgeon: Alphonsa Overall, MD;  Location: [facility];  Service: General;  Laterality: N/A;   ESOPHAGOGASTRODUODENOSCOPY (EGD) WITH PROPOFOL N/A 08/23/2016   Procedure: ESOPHAGOGASTRODUODENOSCOPY (EGD) WITH PROPOFOL;  Surgeon: Alphonsa Overall, MD;  Location: [facility];  Service: General;  Laterality: N/A;   FLEXIBLE SIGMOIDOSCOPY  01/20/2012   [facility]: incomplete/attempted colonoscopy. Inadequate prep precluded examination   HEEL SPUR SURGERY Left 09/11/2013   both have been done   KNEE ARTHROSCOPY  02/04/2004    left knee/partial medial meniscectomy.   KNEE SURGERY     3 arthroscopic  2 on left 1 on right   LAPAROSCOPIC GASTRIC BANDING  2009   LAPAROSCOPIC GASTRIC SLEEVE RESECTION N/A 01/08/2020   Procedure: LAPAROSCOPIC SLEEVE GASTRECTOMY;   Surgeon: Alphonsa Overall, MD;  Location: [facility];  Service: General;  Laterality: N/A;   LAPAROSCOPIC SALPINGOOPHERECTOMY  03/30/2002   TUBAL LIGATION      FAMILY HISTORY: Family History  Problem Relation Age of Onset   Ovarian cancer Mother    Heart disease Mother    Anxiety disorder Mother    Cirrhosis Father        deceased age 6   Alcohol abuse Father    Liver cancer Cousin        age 68, deceased   Drug abuse Cousin    ADD / ADHD Son    Anxiety disorder Sister    Dementia Maternal Grandfather    Colon cancer Other        aunt, deceased age 8   Breast cancer Other        aunt, deceased age 31   Bipolar disorder Neg Hx    Depression Neg Hx    OCD Neg Hx    Paranoid behavior Neg Hx    Schizophrenia Neg Hx    Seizures Neg Hx    Sexual abuse Neg Hx    Physical abuse Neg Hx     SOCIAL HISTORY: Social History   Socioeconomic History   Marital status: Married    Spouse name: Not on file   Number of children: 2   Years of education: college   Highest education level: Not on file  Occupational History   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    Employer: DISABLED  Tobacco Use   Smoking status: Never   Smokeless tobacco: Never  Vaping Use   Vaping Use: Never used  Substance and Sexual Activity   Alcohol use: Not Currently    Comment: occasionally wine   Drug use: No   Sexual activity: Not Currently    Birth control/protection: Surgical  Other Topics Concern   Not on file  Social History Narrative   Not on file   Social Determinants of Health   Financial Resource Strain: Not on file  Food Insecurity: Not on file  Transportation Needs: Not on file  Physical  Activity: Not on file  Stress: Not on file  Social Connections: Not on file  Intimate Partner Violence: Not on file      PHYSICAL EXAM Generalized: Well developed, in no acute distress   Neurological examination  Mentation: Alert oriented to time, place, history taking. Follows  all commands speech and language fluent Cranial nerve II-XII:Extraocular movements were full. Facial symmetry noted. uvula tongue midline. Head turning and shoulder shrug  were normal and symmetric. Motor: Good strength throughout subjectively per patient Sensory: Sensory testing is intact to soft touch on all 4 extremities subjectively per patient Coordination: Cerebellar testing reveals good finger-nose-finger  Gait and station: Patient is able to stand from a seated position. gait is normal.  Reflexes: UTA  DIAGNOSTIC DATA (LABS, IMAGING, TESTING) - I reviewed patient records, labs, notes, testing and imaging myself where available.  Lab Results  Component Value Date   WBC 11.0 (H) 01/09/2020   HGB 12.8 01/09/2020   HCT 41.0 01/09/2020   MCV 91.3 01/09/2020   PLT 215 01/09/2020      Component Value Date/Time   NA 145 (H) 08/13/2020 0813   K 4.4 08/13/2020 0813   CL 102 08/13/2020 0813   CO2 22 08/13/2020 0813   GLUCOSE 97 08/13/2020 0813   GLUCOSE 116 (H) 01/01/2020 1356   BUN 19 08/13/2020 0813   CREATININE 0.61 08/13/2020 0813   CREATININE 0.74 03/23/2011 0400   CALCIUM 9.4 08/13/2020 0813   PROT 6.8 08/13/2020 0813   ALBUMIN 4.3 08/13/2020 0813   AST 20 08/13/2020 0813   ALT 15 08/13/2020 0813   ALKPHOS 86 08/13/2020 0813   BILITOT 0.3 08/13/2020 0813   GFRNONAA >60 01/01/2020 1356   GFRAA >60 01/01/2020 1356   Lab Results  Component Value Date   CHOL 175 08/13/2020   HDL 52 08/13/2020   LDLCALC 101 (H) 08/13/2020   TRIG 122 08/13/2020   CHOLHDL 3.4 08/13/2020   Lab Results  Component Value Date   HGBA1C 5.9 12/30/2020   Lab Results  Component Value Date   GNFAOZHY86 578 03/05/2013   Lab Results  Component Value Date   TSH 1.310 08/13/2020      ASSESSMENT AND PLAN 60 y.o. year old female  has a past medical history of Adenomatous polyp (2009), Anemia (2003 after surgery needed 1 unit blood), Anxiety, Arthritis, Bipolar 1 disorder ([place]),  Constipation, Depression, Diabetes mellitus, Diabetes mellitus, type II ([place]), Diverticula of colon (2009), Generalized headaches, GERD (gastroesophageal reflux disease), History of hiatal hernia, Hypertension, Migraine, Neuropathy, Obesity, Pelvic floor dysfunction, Plantar fasciitis (both feet), and Sleep apnea. here with:  OSA on CPAP  Unable to view download Encourage patient to use CPAP We will have the patient come back in 3 to 4 months she was advised to bring her CPAP machine so we can obtain a download     Ward Givens, MSN, NP-C 03/12/2021, 10:41 AM [facility] [address] [place], [place] [address] [phone]

## 2021-03-12 NOTE — Telephone Encounter (Signed)
noted 

## 2021-03-17 ENCOUNTER — Other Ambulatory Visit: Payer: Self-pay

## 2021-03-17 ENCOUNTER — Other Ambulatory Visit: Payer: Self-pay | Admitting: "Endocrinology

## 2021-03-17 ENCOUNTER — Encounter (HOSPITAL_COMMUNITY): Payer: Self-pay

## 2021-03-17 ENCOUNTER — Telehealth: Payer: Self-pay

## 2021-03-17 ENCOUNTER — Encounter (HOSPITAL_COMMUNITY)
Admission: RE | Admit: 2021-03-17 | Discharge: 2021-03-17 | Disposition: A | Discharge status: Home / Self Care | Payer: Medicare Other | Source: Ambulatory Visit | Attending: Orthopedic Surgery | Admitting: Orthopedic Surgery

## 2021-03-17 VITALS — BP 156/98 | HR 68 | Temp 98.0°F | Resp 16 | Wt 236.0 lb

## 2021-03-17 DIAGNOSIS — G4730 Sleep apnea, unspecified: Secondary | ICD-10-CM | POA: Insufficient documentation

## 2021-03-17 DIAGNOSIS — E1165 Type 2 diabetes mellitus with hyperglycemia: Secondary | ICD-10-CM | POA: Insufficient documentation

## 2021-03-17 DIAGNOSIS — Z01818 Encounter for other preprocedural examination: Secondary | ICD-10-CM | POA: Insufficient documentation

## 2021-03-17 DIAGNOSIS — M1712 Unilateral primary osteoarthritis, left knee: Secondary | ICD-10-CM | POA: Insufficient documentation

## 2021-03-17 DIAGNOSIS — I10 Essential (primary) hypertension: Secondary | ICD-10-CM | POA: Insufficient documentation

## 2021-03-17 DIAGNOSIS — K219 Gastro-esophageal reflux disease without esophagitis: Secondary | ICD-10-CM | POA: Diagnosis not present

## 2021-03-17 LAB — BASIC METABOLIC PANEL
Anion gap: 6 (ref 5–15)
BUN: 27 mg/dL — ABNORMAL HIGH (ref 8–23)
CO2: 28 mmol/L (ref 22–32)
Calcium: 9.1 mg/dL (ref 8.9–10.3)
Chloride: 105 mmol/L (ref 98–111)
Creatinine, Ser: 0.56 mg/dL (ref 0.44–1.00)
GFR, Estimated: >60 mL/min (ref >60)
Glucose, Bld: 116 mg/dL — ABNORMAL HIGH (ref 70–99)
Potassium: 4 mmol/L (ref 3.5–5.1)
Sodium: 139 mmol/L (ref 135–145)

## 2021-03-17 LAB — CBC
HCT: 49.4 % — ABNORMAL HIGH (ref 36.0–46.0)
Hemoglobin: 16.1 g/dL — ABNORMAL HIGH (ref 12.0–15.0)
MCH: 28.7 pg (ref 26.0–34.0)
MCHC: 32.6 g/dL (ref 30.0–36.0)
MCV: 88.1 fL (ref 80.0–100.0)
Platelets: 234 10*3/uL (ref 150–400)
RBC: 5.61 MIL/uL — ABNORMAL HIGH (ref 3.87–5.11)
RDW: 13.4 % (ref 11.5–15.5)
WBC: 10.5 10*3/uL (ref 4.0–10.5)
nRBC: 0 % (ref 0.0–0.2)

## 2021-03-17 LAB — HEMOGLOBIN A1C
Hgb A1c MFr Bld: 6.5 % — ABNORMAL HIGH (ref 4.8–5.6)
Mean Plasma Glucose: 139.85 mg/dL

## 2021-03-17 LAB — GLUCOSE, CAPILLARY: Glucose-Capillary: 115 mg/dL — ABNORMAL HIGH (ref 70–99)

## 2021-03-17 LAB — SURGICAL PCR SCREEN
MRSA, PCR: NEGATIVE
Staphylococcus aureus: NEGATIVE

## 2021-03-17 MED ORDER — TRULICITY 1.5 MG/0.5ML ~~LOC~~ SOAJ: 1.5000 mg | SUBCUTANEOUS | 2 refills | Status: DC

## 2021-03-17 NOTE — Telephone Encounter (Signed)
Patient called the after hours line and wanted to speak to someone in the office about her Trulicity injections.  I called the patient and she stated that she is really stressed because she is getting ready to have knee surgery. Patient stated that her weight loss doctor told her to call and ask Dr. Dorris Fetch if she can increase her dosage of Trulicity. Patient stated that the doctor thinks this will benefit her more. Patient asked to have new dosage sent to [facility] on [address] first to make sure she can tolerate the new dose well.

## 2021-03-17 NOTE — Telephone Encounter (Signed)
I called the patient and let her know the new prescription has been sent to her pharmacy and gave her the message per Dr. Dorris Fetch. Patient disconnected the phone and can be advised more if she calls back. She was given the full message.

## 2021-03-18 NOTE — Progress Notes (Signed)
Anesthesia Chart Review   Case: [id number] Date/Time: 03/27/21 1210   Procedure: TOTAL KNEE ARTHROPLASTY (Left: Knee)   Anesthesia type: Spinal   Pre-op diagnosis: left knee endstage osteoarthritis   Location: [facility]OM 07 / [facility]   Surgeons: Netta Cedars, MD       DISCUSSION:60 y.o. never smoker with /o HTN, GERD, DM II, sleep apnea with cpap, left knee OA scheduled for above procedure 03/27/2021 with Dr. Netta Cedars.   Clearance from PCP received which states pt is moderate risk due to BMI.   Anticipate pt can proceed with planned procedure barring acute status change.   VS: BP (!) 156/98   Pulse 68   Temp 36.7 C (Oral)   Resp 16   Wt 107 kg   SpO2 99%   BMI 38.09 kg/m   PROVIDERS: Sharilyn Sites, MD is PCP    LABS: Labs reviewed: Acceptable for surgery. (all labs ordered are listed, but only abnormal results are displayed)  Labs Reviewed  HEMOGLOBIN A1C - Abnormal; Notable for the following components:      Result Value   Hgb A1c MFr Bld 6.5 (*)    All other components within normal limits  BASIC METABOLIC PANEL - Abnormal; Notable for the following components:   Glucose, Bld 116 (*)    BUN 27 (*)    All other components within normal limits  CBC - Abnormal; Notable for the following components:   RBC 5.61 (*)    Hemoglobin 16.1 (*)    HCT 49.4 (*)    All other components within normal limits  GLUCOSE, CAPILLARY - Abnormal; Notable for the following components:   Glucose-Capillary 115 (*)    All other components within normal limits  SURGICAL PCR SCREEN     IMAGES:   EKG: 03/17/2021 Rate 65 bpm  NSR   CV:  Past Medical History:  Diagnosis Date   Adenomatous polyp 2009   Anxiety    Arthritis    Bipolar 1 disorder ([place])    Constipation    Depression    Diabetes mellitus    Diabetes mellitus, type II ([place])    Diverticula of colon 2009   Generalized headaches    GERD (gastroesophageal reflux disease)    History of hiatal hernia    small    Hypertension    Migraine    Neuropathy    both feet   Obesity    Pelvic floor dysfunction    abnormal anorectal manometry at [facility] in 2009   Plantar fasciitis both feet   Sleep apnea    cpap setting of 3.5    Past Surgical History:  Procedure Laterality Date   ABDOMINAL HYSTERECTOMY     compete   CATARACT EXTRACTION W/PHACO Right 02/15/2017   Procedure: CATARACT EXTRACTION PHACO AND INTRAOCULAR LENS PLACEMENT ([place]);  Surgeon: Rutherford Guys, MD;  Location: [facility];  Service: Ophthalmology;  Laterality: Right;  CDE: 4.18   CATARACT EXTRACTION W/PHACO Left 03/01/2017   Procedure: CATARACT EXTRACTION PHACO AND INTRAOCULAR LENS PLACEMENT ([facility]);  Surgeon: Rutherford Guys, MD;  Location: [facility];  Service: Ophthalmology;  Laterality: Left;  CDE: 2.56   COLON RESECTION    03/30/2002   with end-colostomy and Hartmann's pouch   COLON SURGERY     complicated diverticulitis requiring sigmoid resection with colostomy and subsequent takedown   COLONOSCOPY  12/11/2007   Dr. Gala Romney- marginal prep, normal rectum pancolonic diverticula, adenomatous polyp   COLONOSCOPY  08/28/2003      Wide open colonic  anastomosis/Scattered diverticula noted throughout colon/ Small external hemorrhoids   COLONOSCOPY  04/2012   [facility]: hyperplastic polyps, diverticulosis, ileocolonic anastomosis.   COLONOSCOPY WITH PROPOFOL N/A 06/07/2016   Procedure: COLONOSCOPY WITH PROPOFOL;  Surgeon: Daneil Dolin, MD;  [facility];  Service: Endoscopy;  Laterality: N/A;  7:30 am   COLOSTOMY CLOSURE  07/10/2002   ESOPHAGOGASTRODUODENOSCOPY N/A 01/08/2020   Procedure: UPPER GASTROINTESTINAL ENDOSCOPY;  Surgeon: Alphonsa Overall, MD;  Location[facility];  Service: General;  Laterality: N/A;   ESOPHAGOGASTRODUODENOSCOPY (EGD) WITH PROPOFOL N/A 08/23/2016   Procedure: ESOPHAGOGASTRODUODENOSCOPY (EGD) WITH PROPOFOL;  Surgeon: Alphonsa Overall, MD;  Location: [facility];  Service: General;  Laterality: N/A;   FLEXIBLE SIGMOIDOSCOPY  01/20/2012    RMR: incomplete/attempted colonoscopy. Inadequate prep precluded examination   HEEL SPUR SURGERY Left 09/11/2013   both have been done   KNEE ARTHROSCOPY  02/04/2004    left knee/partial medial meniscectomy.   KNEE SURGERY     3 arthroscopic  2 on left 1 on right   LAPAROSCOPIC GASTRIC BANDING  2009   LAPAROSCOPIC GASTRIC SLEEVE RESECTION N/A 01/08/2020   Procedure: LAPAROSCOPIC SLEEVE GASTRECTOMY;  Surgeon: Alphonsa Overall, MD;  [facility];  Service: General;  Laterality: N/A;   LAPAROSCOPIC SALPINGOOPHERECTOMY  03/30/2002   TUBAL LIGATION      MEDICATIONS:  ALPRAZolam (XANAX) 1 MG tablet   ARIPiprazole (ABILIFY) 10 MG tablet   ascorbic acid (VITAMIN C) 500 MG tablet   Blood Glucose Monitoring Suppl (FREESTYLE LITE) DEVI   CALCIUM-VITAMIN D-VITAMIN K PO   Continuous Blood Gluc Receiver (FREESTYLE LIBRE 2 READER) DEVI   Continuous Blood Gluc Sensor (FREESTYLE LIBRE 2 SENSOR) MISC   cycloSPORINE (RESTASIS) 0.05 % ophthalmic emulsion   diclofenac (VOLTAREN) 75 MG EC tablet   Dulaglutide (TRULICITY) 1.5 GO/1.1XB SOPN   escitalopram (LEXAPRO) 20 MG tablet   fluticasone (FLONASE) 50 MCG/ACT nasal spray   gabapentin (NEURONTIN) 100 MG capsule   glucose blood test strip   HYDROcodone-acetaminophen (NORCO/VICODIN) 5-325 MG tablet   Insulin Pen Needle (B-D ULTRAFINE III SHORT PEN) 31G X 8 MM MISC   loratadine (CLARITIN) 10 MG tablet   meclizine (ANTIVERT) 25 MG tablet   Menthol, Topical Analgesic, (ICY HOT EX)   methocarbamol (ROBAXIN) 500 MG tablet   Multiple Vitamin (MULTIVITAMIN WITH MINERALS) TABS tablet   pantoprazole (PROTONIX) 40 MG tablet   Probiotic Product (PROBIOTIC PO)   Propylene Glycol-Glycerin 0.6-0.6 % SOLN   rizatriptan (MAXALT-MLT) 10 MG disintegrating tablet   temazepam (RESTORIL) 30 MG capsule   No current facility-administered medications for this encounter.   Konrad Felix Ward, PA-C [facility] [phone]

## 2021-03-19 ENCOUNTER — Other Ambulatory Visit: Payer: Self-pay

## 2021-03-19 ENCOUNTER — Ambulatory Visit (HOSPITAL_COMMUNITY)
Admission: RE | Admit: 2021-03-19 | Discharge: 2021-03-19 | Disposition: A | Discharge status: Home / Self Care | Payer: Medicare Other | Source: Ambulatory Visit | Attending: Family Medicine | Admitting: Family Medicine

## 2021-03-19 DIAGNOSIS — Z1231 Encounter for screening mammogram for malignant neoplasm of breast: Secondary | ICD-10-CM | POA: Diagnosis not present

## 2021-03-19 DIAGNOSIS — M47816 Spondylosis without myelopathy or radiculopathy, lumbar region: Secondary | ICD-10-CM | POA: Diagnosis not present

## 2021-03-25 ENCOUNTER — Other Ambulatory Visit: Payer: Self-pay | Admitting: Orthopedic Surgery

## 2021-03-25 DIAGNOSIS — H0100A Unspecified blepharitis right eye, upper and lower eyelids: Secondary | ICD-10-CM | POA: Diagnosis not present

## 2021-03-25 DIAGNOSIS — H43812 Vitreous degeneration, left eye: Secondary | ICD-10-CM | POA: Diagnosis not present

## 2021-03-25 DIAGNOSIS — H0100B Unspecified blepharitis left eye, upper and lower eyelids: Secondary | ICD-10-CM | POA: Diagnosis not present

## 2021-03-25 DIAGNOSIS — H33322 Round hole, left eye: Secondary | ICD-10-CM | POA: Diagnosis not present

## 2021-03-26 ENCOUNTER — Telehealth (INDEPENDENT_AMBULATORY_CARE_PROVIDER_SITE_OTHER): Payer: Medicare Other | Admitting: Psychiatry

## 2021-03-26 ENCOUNTER — Encounter (HOSPITAL_COMMUNITY): Payer: Self-pay | Admitting: Psychiatry

## 2021-03-26 ENCOUNTER — Other Ambulatory Visit: Payer: Self-pay

## 2021-03-26 DIAGNOSIS — F3162 Bipolar disorder, current episode mixed, moderate: Secondary | ICD-10-CM | POA: Diagnosis not present

## 2021-03-26 LAB — SARS CORONAVIRUS 2 (TAT 6-24 HRS): SARS Coronavirus 2: NEGATIVE

## 2021-03-26 MED ORDER — ESCITALOPRAM OXALATE 20 MG PO TABS: 20.0000 mg | ORAL_TABLET | Freq: Every day | ORAL | 2 refills | Status: DC

## 2021-03-26 MED ORDER — ALPRAZOLAM 1 MG PO TABS: 1.0000 mg | ORAL_TABLET | Freq: Three times a day (TID) | ORAL | 1 refills | Status: DC | PRN

## 2021-03-26 MED ORDER — TEMAZEPAM 30 MG PO CAPS
30.0000 mg | ORAL_CAPSULE | Freq: Every evening | ORAL | 2 refills | Status: DC | PRN
Start: 2021-03-26 — End: 2021-05-14

## 2021-03-26 MED ORDER — ARIPIPRAZOLE 10 MG PO TABS: 10.0000 mg | ORAL_TABLET | Freq: Every day | ORAL | 2 refills | Status: DC
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
## 2021-03-27 ENCOUNTER — Encounter (HOSPITAL_COMMUNITY)
Admission: RE | Disposition: A | Discharge status: Home / Self Care | Payer: Self-pay | Source: Ambulatory Visit | Attending: Orthopedic Surgery

## 2021-03-27 ENCOUNTER — Other Ambulatory Visit: Payer: Self-pay

## 2021-03-27 ENCOUNTER — Encounter (HOSPITAL_COMMUNITY): Payer: Self-pay | Admitting: Orthopedic Surgery

## 2021-03-27 ENCOUNTER — Observation Stay (HOSPITAL_COMMUNITY)
Admission: RE | Admit: 2021-03-27 | Discharge: 2021-03-29 | Disposition: A | Discharge status: Home / Self Care | Payer: Medicare Other | Source: Ambulatory Visit | Attending: Orthopedic Surgery | Admitting: Orthopedic Surgery

## 2021-03-27 ENCOUNTER — Ambulatory Visit (HOSPITAL_COMMUNITY): Payer: Medicare Other | Admitting: Anesthesiology

## 2021-03-27 ENCOUNTER — Ambulatory Visit (HOSPITAL_COMMUNITY): Payer: Medicare Other | Admitting: Physician Assistant

## 2021-03-27 DIAGNOSIS — G8918 Other acute postprocedural pain: Secondary | ICD-10-CM | POA: Diagnosis not present

## 2021-03-27 DIAGNOSIS — I10 Essential (primary) hypertension: Secondary | ICD-10-CM | POA: Diagnosis not present

## 2021-03-27 DIAGNOSIS — M1712 Unilateral primary osteoarthritis, left knee: Secondary | ICD-10-CM | POA: Diagnosis not present

## 2021-03-27 DIAGNOSIS — Z79899 Other long term (current) drug therapy: Secondary | ICD-10-CM | POA: Insufficient documentation

## 2021-03-27 DIAGNOSIS — E119 Type 2 diabetes mellitus without complications: Secondary | ICD-10-CM | POA: Insufficient documentation

## 2021-03-27 DIAGNOSIS — G4733 Obstructive sleep apnea (adult) (pediatric): Secondary | ICD-10-CM | POA: Diagnosis not present

## 2021-03-27 DIAGNOSIS — Z9989 Dependence on other enabling machines and devices: Secondary | ICD-10-CM | POA: Diagnosis not present

## 2021-03-27 DIAGNOSIS — Z96652 Presence of left artificial knee joint: Secondary | ICD-10-CM

## 2021-03-27 HISTORY — PX: TOTAL KNEE ARTHROPLASTY: SHX125

## 2021-03-27 LAB — GLUCOSE, CAPILLARY
Glucose-Capillary: 101 mg/dL — ABNORMAL HIGH (ref 70–99)
Glucose-Capillary: 90 mg/dL (ref 70–99)
Glucose-Capillary: 150 mg/dL — ABNORMAL HIGH (ref 70–99)

## 2021-03-27 SURGERY — ARTHROPLASTY, KNEE, TOTAL
Anesthesia: Spinal | Site: Knee | Laterality: Left

## 2021-03-27 MED ORDER — SUMATRIPTAN SUCCINATE 25 MG PO TABS
50.0000 mg | ORAL_TABLET | ORAL | Status: DC | PRN
  Filled 2021-03-27: qty 2

## 2021-03-27 MED ORDER — SODIUM CHLORIDE 0.9 % IR SOLN
Status: DC | PRN
  Administered 2021-03-27: 3000 mL

## 2021-03-27 MED ORDER — SODIUM CHLORIDE 0.9 % IV SOLN: INTRAVENOUS | Status: DC

## 2021-03-27 MED ORDER — PHENOL 1.4 % MT LIQD: 1.0000 | OROMUCOSAL | Status: DC | PRN

## 2021-03-27 MED ORDER — OXYCODONE HCL 5 MG PO TABS: 5.0000 mg | ORAL_TABLET | Freq: Once | ORAL | Status: DC | PRN

## 2021-03-27 MED ORDER — ONDANSETRON HCL 4 MG/2ML IJ SOLN
INTRAMUSCULAR | Status: AC
  Filled 2021-03-27: qty 2

## 2021-03-27 MED ORDER — BUPIVACAINE IN DEXTROSE 0.75-8.25 % IT SOLN
INTRATHECAL | Status: DC | PRN
  Administered 2021-03-27: 1.8 mL via INTRATHECAL

## 2021-03-27 MED ORDER — METHOCARBAMOL 500 MG PO TABS
500.0000 mg | ORAL_TABLET | Freq: Four times a day (QID) | ORAL | Status: DC | PRN
  Administered 2021-03-27 – 2021-03-29 (×5): 500 mg via ORAL
  Filled 2021-03-27 (×5): qty 1

## 2021-03-27 MED ORDER — PROPOFOL 10 MG/ML IV BOLUS
INTRAVENOUS | Status: DC | PRN
  Administered 2021-03-27 (×2): 20 mg via INTRAVENOUS

## 2021-03-27 MED ORDER — BUPIVACAINE-EPINEPHRINE 0.25% -1:200000 IJ SOLN
INTRAMUSCULAR | Status: DC | PRN
  Administered 2021-03-27: 30 mL

## 2021-03-27 MED ORDER — INSULIN ASPART 100 UNIT/ML IJ SOLN
0.0000 [IU] | Freq: Three times a day (TID) | INTRAMUSCULAR | Status: DC
  Administered 2021-03-28 (×2): 4 [IU] via SUBCUTANEOUS
  Administered 2021-03-29: 3 [IU] via SUBCUTANEOUS

## 2021-03-27 MED ORDER — ACETAMINOPHEN 500 MG PO TABS: 1000.0000 mg | ORAL_TABLET | Freq: Once | ORAL | Status: DC

## 2021-03-27 MED ORDER — MIDAZOLAM HCL 2 MG/2ML IJ SOLN: 0.5000 mg | Freq: Once | INTRAMUSCULAR | Status: DC | PRN

## 2021-03-27 MED ORDER — LACTATED RINGERS IV SOLN: INTRAVENOUS | Status: DC

## 2021-03-27 MED ORDER — INSULIN ASPART 100 UNIT/ML IJ SOLN
6.0000 [IU] | Freq: Three times a day (TID) | INTRAMUSCULAR | Status: DC
  Administered 2021-03-28 – 2021-03-29 (×3): 6 [IU] via SUBCUTANEOUS

## 2021-03-27 MED ORDER — ONDANSETRON HCL 4 MG/2ML IJ SOLN
INTRAMUSCULAR | Status: DC | PRN
  Administered 2021-03-27: 4 mg via INTRAVENOUS

## 2021-03-27 MED ORDER — CEFAZOLIN SODIUM-DEXTROSE 2-4 GM/100ML-% IV SOLN
2.0000 g | INTRAVENOUS | Status: AC
  Administered 2021-03-27: 2 g via INTRAVENOUS
  Filled 2021-03-27: qty 100

## 2021-03-27 MED ORDER — MEPERIDINE HCL 50 MG/ML IJ SOLN: 6.2500 mg | INTRAMUSCULAR | Status: DC | PRN

## 2021-03-27 MED ORDER — ONDANSETRON HCL 4 MG PO TABS: 4.0000 mg | ORAL_TABLET | Freq: Four times a day (QID) | ORAL | Status: DC | PRN

## 2021-03-27 MED ORDER — ASPIRIN 81 MG PO CHEW: 81.0000 mg | CHEWABLE_TABLET | Freq: Two times a day (BID) | ORAL | 0 refills | Status: AC

## 2021-03-27 MED ORDER — METOCLOPRAMIDE HCL 5 MG PO TABS: 5.0000 mg | ORAL_TABLET | Freq: Three times a day (TID) | ORAL | Status: DC | PRN

## 2021-03-27 MED ORDER — PROMETHAZINE HCL 25 MG/ML IJ SOLN: 6.2500 mg | INTRAMUSCULAR | Status: DC | PRN

## 2021-03-27 MED ORDER — PANTOPRAZOLE SODIUM 40 MG PO TBEC
40.0000 mg | DELAYED_RELEASE_TABLET | Freq: Two times a day (BID) | ORAL | Status: DC
  Administered 2021-03-27 – 2021-03-29 (×4): 40 mg via ORAL
  Filled 2021-03-27 (×4): qty 1

## 2021-03-27 MED ORDER — PHENYLEPHRINE 40 MCG/ML (10ML) SYRINGE FOR IV PUSH (FOR BLOOD PRESSURE SUPPORT)
PREFILLED_SYRINGE | INTRAVENOUS | Status: AC
  Filled 2021-03-27: qty 10

## 2021-03-27 MED ORDER — ADULT MULTIVITAMIN W/MINERALS CH
1.0000 | ORAL_TABLET | Freq: Every day | ORAL | Status: DC
  Administered 2021-03-28 – 2021-03-29 (×2): 1 via ORAL
  Filled 2021-03-27 (×2): qty 1

## 2021-03-27 MED ORDER — ORAL CARE MOUTH RINSE: 15.0000 mL | Freq: Once | OROMUCOSAL | Status: AC

## 2021-03-27 MED ORDER — CEFAZOLIN SODIUM-DEXTROSE 2-4 GM/100ML-% IV SOLN
2.0000 g | Freq: Four times a day (QID) | INTRAVENOUS | Status: AC
  Administered 2021-03-27 – 2021-03-28 (×2): 2 g via INTRAVENOUS
  Filled 2021-03-27 (×2): qty 100

## 2021-03-27 MED ORDER — BUPIVACAINE LIPOSOME 1.3 % IJ SUSP: 20.0000 mL | Freq: Once | INTRAMUSCULAR | Status: DC

## 2021-03-27 MED ORDER — POVIDONE-IODINE 10 % EX SWAB
2.0000 "application " | Freq: Once | CUTANEOUS | Status: AC
  Administered 2021-03-27: 2 via TOPICAL

## 2021-03-27 MED ORDER — OXYCODONE-ACETAMINOPHEN 5-325 MG PO TABS: 1.0000 | ORAL_TABLET | ORAL | 0 refills | Status: DC | PRN

## 2021-03-27 MED ORDER — OXYCODONE HCL 5 MG PO TABS
5.0000 mg | ORAL_TABLET | ORAL | Status: DC | PRN
  Administered 2021-03-27: 5 mg via ORAL
  Administered 2021-03-28 – 2021-03-29 (×7): 10 mg via ORAL
  Filled 2021-03-27 (×4): qty 2
  Filled 2021-03-27: qty 1
  Filled 2021-03-27 (×3): qty 2

## 2021-03-27 MED ORDER — ROPIVACAINE HCL 5 MG/ML IJ SOLN
INTRAMUSCULAR | Status: DC | PRN
  Administered 2021-03-27: 20 mL via PERINEURAL

## 2021-03-27 MED ORDER — TEMAZEPAM 15 MG PO CAPS
30.0000 mg | ORAL_CAPSULE | Freq: Every evening | ORAL | Status: DC | PRN
  Administered 2021-03-27 – 2021-03-28 (×2): 30 mg via ORAL
  Filled 2021-03-27 (×2): qty 2

## 2021-03-27 MED ORDER — HYDROMORPHONE HCL 1 MG/ML IJ SOLN: 0.2500 mg | INTRAMUSCULAR | Status: DC | PRN

## 2021-03-27 MED ORDER — BISACODYL 10 MG RE SUPP: 10.0000 mg | Freq: Every day | RECTAL | Status: DC | PRN

## 2021-03-27 MED ORDER — PROPOFOL 1000 MG/100ML IV EMUL
INTRAVENOUS | Status: AC
  Filled 2021-03-27: qty 100

## 2021-03-27 MED ORDER — METHOCARBAMOL 500 MG IVPB - SIMPLE MED
500.0000 mg | Freq: Four times a day (QID) | INTRAVENOUS | Status: DC | PRN
  Filled 2021-03-27: qty 50

## 2021-03-27 MED ORDER — ASPIRIN 81 MG PO CHEW
81.0000 mg | CHEWABLE_TABLET | Freq: Two times a day (BID) | ORAL | Status: DC
  Administered 2021-03-28 – 2021-03-29 (×3): 81 mg via ORAL
  Filled 2021-03-27 (×3): qty 1

## 2021-03-27 MED ORDER — RISAQUAD PO CAPS
1.0000 | ORAL_CAPSULE | Freq: Every day | ORAL | Status: DC
  Administered 2021-03-28 – 2021-03-29 (×2): 1 via ORAL
  Filled 2021-03-27 (×2): qty 1

## 2021-03-27 MED ORDER — PHENYLEPHRINE HCL-NACL 20-0.9 MG/250ML-% IV SOLN
INTRAVENOUS | Status: DC | PRN
  Administered 2021-03-27: 30 ug/min via INTRAVENOUS

## 2021-03-27 MED ORDER — PROPOFOL 500 MG/50ML IV EMUL
INTRAVENOUS | Status: DC | PRN
Start: 2021-03-27 — End: 2021-03-27
  Administered 2021-03-27: 80 ug/kg/min via INTRAVENOUS

## 2021-03-27 MED ORDER — METHOCARBAMOL 500 MG PO TABS: 500.0000 mg | ORAL_TABLET | Freq: Three times a day (TID) | ORAL | 1 refills | Status: DC | PRN

## 2021-03-27 MED ORDER — INSULIN ASPART 100 UNIT/ML IJ SOLN: 0.0000 [IU] | Freq: Every day | INTRAMUSCULAR | Status: DC

## 2021-03-27 MED ORDER — ONDANSETRON HCL 4 MG PO TABS: 4.0000 mg | ORAL_TABLET | Freq: Three times a day (TID) | ORAL | 1 refills | Status: DC | PRN

## 2021-03-27 MED ORDER — DULAGLUTIDE 1.5 MG/0.5ML ~~LOC~~ SOAJ: 1.5000 mg | SUBCUTANEOUS | Status: DC

## 2021-03-27 MED ORDER — BUPIVACAINE LIPOSOME 1.3 % IJ SUSP
INTRAMUSCULAR | Status: AC
  Filled 2021-03-27: qty 20

## 2021-03-27 MED ORDER — MECLIZINE HCL 25 MG PO TABS
25.0000 mg | ORAL_TABLET | Freq: Two times a day (BID) | ORAL | Status: DC | PRN
  Administered 2021-03-29: 25 mg via ORAL
  Filled 2021-03-27: qty 1

## 2021-03-27 MED ORDER — INSULIN PEN NEEDLE 31G X 8 MM MISC: 1.0000 | Status: DC

## 2021-03-27 MED ORDER — ASCORBIC ACID 500 MG PO TABS
500.0000 mg | ORAL_TABLET | Freq: Every day | ORAL | Status: DC
  Administered 2021-03-28 – 2021-03-29 (×2): 500 mg via ORAL
  Filled 2021-03-27 (×2): qty 1

## 2021-03-27 MED ORDER — MENTHOL 3 MG MT LOZG
1.0000 | LOZENGE | OROMUCOSAL | Status: DC | PRN
  Filled 2021-03-27: qty 9

## 2021-03-27 MED ORDER — FENTANYL CITRATE PF 50 MCG/ML IJ SOSY
50.0000 ug | PREFILLED_SYRINGE | INTRAMUSCULAR | Status: DC
  Administered 2021-03-27 (×2): 50 ug via INTRAVENOUS
  Filled 2021-03-27: qty 2

## 2021-03-27 MED ORDER — BUPIVACAINE LIPOSOME 1.3 % IJ SUSP
INTRAMUSCULAR | Status: DC | PRN
  Administered 2021-03-27: 20 mL

## 2021-03-27 MED ORDER — EPHEDRINE 5 MG/ML INJ
INTRAVENOUS | Status: AC
  Filled 2021-03-27: qty 5

## 2021-03-27 MED ORDER — HYDROMORPHONE HCL 1 MG/ML IJ SOLN
0.5000 mg | INTRAMUSCULAR | Status: DC | PRN
  Administered 2021-03-27: 1 mg via INTRAVENOUS
  Filled 2021-03-27 (×2): qty 1

## 2021-03-27 MED ORDER — METOCLOPRAMIDE HCL 5 MG/ML IJ SOLN: 5.0000 mg | Freq: Three times a day (TID) | INTRAMUSCULAR | Status: DC | PRN

## 2021-03-27 MED ORDER — ARIPIPRAZOLE 10 MG PO TABS
10.0000 mg | ORAL_TABLET | Freq: Every day | ORAL | Status: DC
  Administered 2021-03-27 – 2021-03-28 (×2): 10 mg via ORAL
  Filled 2021-03-27 (×2): qty 1

## 2021-03-27 MED ORDER — MIDAZOLAM HCL 2 MG/2ML IJ SOLN
1.0000 mg | INTRAMUSCULAR | Status: DC
  Administered 2021-03-27: 2 mg via INTRAVENOUS
  Filled 2021-03-27: qty 2

## 2021-03-27 MED ORDER — CYCLOSPORINE 0.05 % OP EMUL
2.0000 [drp] | Freq: Two times a day (BID) | OPHTHALMIC | Status: DC
  Administered 2021-03-27 – 2021-03-29 (×4): 2 [drp] via OPHTHALMIC
  Filled 2021-03-27 (×4): qty 30

## 2021-03-27 MED ORDER — POLYETHYLENE GLYCOL 3350 17 G PO PACK: 17.0000 g | PACK | Freq: Every day | ORAL | Status: DC | PRN

## 2021-03-27 MED ORDER — ONDANSETRON HCL 4 MG/2ML IJ SOLN
4.0000 mg | Freq: Four times a day (QID) | INTRAMUSCULAR | Status: DC | PRN
  Administered 2021-03-27 – 2021-03-28 (×2): 4 mg via INTRAVENOUS
  Filled 2021-03-27 (×2): qty 2

## 2021-03-27 MED ORDER — TRANEXAMIC ACID-NACL 1000-0.7 MG/100ML-% IV SOLN
1000.0000 mg | Freq: Once | INTRAVENOUS | Status: AC
  Administered 2021-03-27: 1000 mg via INTRAVENOUS
  Filled 2021-03-27: qty 100

## 2021-03-27 MED ORDER — FREESTYLE LIBRE 2 SENSOR MISC: 1.0000 | Status: DC

## 2021-03-27 MED ORDER — TRANEXAMIC ACID-NACL 1000-0.7 MG/100ML-% IV SOLN
1000.0000 mg | INTRAVENOUS | Status: AC
  Administered 2021-03-27: 1000 mg via INTRAVENOUS
  Filled 2021-03-27: qty 100

## 2021-03-27 MED ORDER — GABAPENTIN 100 MG PO CAPS
100.0000 mg | ORAL_CAPSULE | Freq: Two times a day (BID) | ORAL | Status: DC
  Administered 2021-03-27 – 2021-03-29 (×4): 100 mg via ORAL
  Filled 2021-03-27 (×4): qty 1

## 2021-03-27 MED ORDER — ESCITALOPRAM OXALATE 20 MG PO TABS
20.0000 mg | ORAL_TABLET | Freq: Every day | ORAL | Status: DC
  Administered 2021-03-27 – 2021-03-29 (×3): 20 mg via ORAL
  Filled 2021-03-27 (×3): qty 1

## 2021-03-27 MED ORDER — DICLOFENAC SODIUM 75 MG PO TBEC
75.0000 mg | DELAYED_RELEASE_TABLET | Freq: Two times a day (BID) | ORAL | Status: DC
  Administered 2021-03-27 – 2021-03-28 (×3): 75 mg via ORAL
  Filled 2021-03-27 (×4): qty 1

## 2021-03-27 MED ORDER — PROPOFOL 10 MG/ML IV BOLUS
INTRAVENOUS | Status: AC
  Filled 2021-03-27: qty 20

## 2021-03-27 MED ORDER — DOCUSATE SODIUM 100 MG PO CAPS
100.0000 mg | ORAL_CAPSULE | Freq: Two times a day (BID) | ORAL | Status: DC
  Administered 2021-03-27 – 2021-03-29 (×4): 100 mg via ORAL
  Filled 2021-03-27 (×4): qty 1

## 2021-03-27 MED ORDER — OXYCODONE HCL 5 MG/5ML PO SOLN: 5.0000 mg | Freq: Once | ORAL | Status: DC | PRN

## 2021-03-27 MED ORDER — OYSTER SHELL CALCIUM/D3 500-5 MG-MCG PO TABS
1.0000 | ORAL_TABLET | Freq: Every day | ORAL | Status: DC
  Administered 2021-03-28 – 2021-03-29 (×2): 1 via ORAL
  Filled 2021-03-27 (×2): qty 1

## 2021-03-27 MED ORDER — CHLORHEXIDINE GLUCONATE 0.12 % MT SOLN
15.0000 mL | Freq: Once | OROMUCOSAL | Status: AC
  Administered 2021-03-27: 15 mL via OROMUCOSAL

## 2021-03-27 MED ORDER — POLYVINYL ALCOHOL 1.4 % OP SOLN
1.0000 [drp] | Freq: Two times a day (BID) | OPHTHALMIC | Status: DC
  Administered 2021-03-27 – 2021-03-29 (×4): 1 [drp] via OPHTHALMIC
  Filled 2021-03-27: qty 15

## 2021-03-27 MED ORDER — LORATADINE 10 MG PO TABS
10.0000 mg | ORAL_TABLET | Freq: Every day | ORAL | Status: DC
  Administered 2021-03-28 – 2021-03-29 (×2): 10 mg via ORAL
  Filled 2021-03-27 (×2): qty 1

## 2021-03-27 MED ORDER — ACETAMINOPHEN 500 MG PO TABS
1000.0000 mg | ORAL_TABLET | Freq: Once | ORAL | Status: AC
  Administered 2021-03-27: 1000 mg via ORAL
  Filled 2021-03-27: qty 2

## 2021-03-27 MED ORDER — 0.9 % SODIUM CHLORIDE (POUR BTL) OPTIME
TOPICAL | Status: DC | PRN
  Administered 2021-03-27: 1000 mL

## 2021-03-27 MED ORDER — ALPRAZOLAM 1 MG PO TABS
1.0000 mg | ORAL_TABLET | Freq: Three times a day (TID) | ORAL | Status: DC | PRN
  Administered 2021-03-27 – 2021-03-28 (×2): 1 mg via ORAL
  Filled 2021-03-27 (×2): qty 1

## 2021-03-27 MED ORDER — SODIUM CHLORIDE (PF) 0.9 % IJ SOLN
INTRAMUSCULAR | Status: DC | PRN
  Administered 2021-03-27: 30 mL

## 2021-03-27 MED ORDER — FLUTICASONE PROPIONATE 50 MCG/ACT NA SUSP: 2.0000 | Freq: Every day | NASAL | Status: DC | PRN

## 2021-03-27 MED ORDER — ACETAMINOPHEN 325 MG PO TABS
325.0000 mg | ORAL_TABLET | Freq: Four times a day (QID) | ORAL | Status: DC | PRN
  Administered 2021-03-27 – 2021-03-29 (×3): 650 mg via ORAL
  Filled 2021-03-27 (×3): qty 2

## 2021-03-27 SURGICAL SUPPLY — 45 items
ATTUNE PSFEM LTSZ6 NARCEM KNEE (Femur) ×2 IMPLANT
ATTUNE PSRP INSR SZ6 8 KNEE (Insert) ×2 IMPLANT
BASE TIBIAL ROT PLAT SZ 5 KNEE (Knees) ×1 IMPLANT
BLADE SAG 18X100X1.27 (BLADE) ×2 IMPLANT
BLADE SAW SGTL 13X75X1.27 (BLADE) ×2 IMPLANT
BNDG CMPR MED 10X6 ELC LF (GAUZE/BANDAGES/DRESSINGS) ×1
BNDG ELASTIC 6X10 VLCR STRL LF (GAUZE/BANDAGES/DRESSINGS) ×2 IMPLANT
BNDG GAUZE ELAST 4 BULKY (GAUZE/BANDAGES/DRESSINGS) ×2 IMPLANT
BOWL SMART MIX CTS (DISPOSABLE) ×2 IMPLANT
BSPLAT TIB 5 CMNT ROT PLAT STR (Knees) ×1 IMPLANT
CEMENT HV SMART SET (Cement) ×4 IMPLANT
COVER SURGICAL LIGHT HANDLE (MISCELLANEOUS) ×2 IMPLANT
CUFF TOURN SGL QUICK 34 (TOURNIQUET CUFF) ×2
CUFF TRNQT CYL 34X4.125X (TOURNIQUET CUFF) ×1 IMPLANT
DRAPE SHEET LG 3/4 BI-LAMINATE (DRAPES) ×2 IMPLANT
DRAPE U-SHAPE 47X51 STRL (DRAPES) ×2 IMPLANT
DRSG ADAPTIC 3X8 NADH LF (GAUZE/BANDAGES/DRESSINGS) ×2 IMPLANT
DRSG PAD ABDOMINAL 8X10 ST (GAUZE/BANDAGES/DRESSINGS) ×2 IMPLANT
DURAPREP 26ML APPLICATOR (WOUND CARE) ×2 IMPLANT
ELECT REM PT RETURN 15FT ADLT (MISCELLANEOUS) ×2 IMPLANT
GAUZE SPONGE 4X4 12PLY STRL (GAUZE/BANDAGES/DRESSINGS) ×2 IMPLANT
GLOVE SURG ORTHO LTX SZ7.5 (GLOVE) ×2 IMPLANT
GLOVE SURG ORTHO LTX SZ8.5 (GLOVE) ×2 IMPLANT
GLOVE SURG UNDER POLY LF SZ7.5 (GLOVE) ×2 IMPLANT
GLOVE SURG UNDER POLY LF SZ8.5 (GLOVE) ×2 IMPLANT
GOWN STRL REUS W/TWL XL LVL3 (GOWN DISPOSABLE) ×4 IMPLANT
HANDPIECE INTERPULSE COAX TIP (DISPOSABLE) ×2
IMMOBILIZER KNEE 20 (SOFTGOODS) ×2
IMMOBILIZER KNEE 20 THIGH 36 (SOFTGOODS) ×1 IMPLANT
KIT TURNOVER KIT A (KITS) ×2 IMPLANT
MANIFOLD NEPTUNE II (INSTRUMENTS) ×2 IMPLANT
NS IRRIG 1000ML POUR BTL (IV SOLUTION) ×2 IMPLANT
PACK TOTAL KNEE CUSTOM (KITS) ×2 IMPLANT
PATELLA MEDIAL ATTUN 35MM KNEE (Knees) ×2 IMPLANT
PROTECTOR NERVE ULNAR (MISCELLANEOUS) ×2 IMPLANT
SET HNDPC FAN SPRY TIP SCT (DISPOSABLE) ×1 IMPLANT
STRIP CLOSURE SKIN 1/2X4 (GAUZE/BANDAGES/DRESSINGS) ×2 IMPLANT
SUT MNCRL AB 3-0 PS2 18 (SUTURE) ×2 IMPLANT
SUT VIC AB 0 CT1 36 (SUTURE) ×2 IMPLANT
SUT VIC AB 1 CT1 36 (SUTURE) ×6 IMPLANT
SUT VIC AB 2-0 CT1 27 (SUTURE) ×2
SUT VIC AB 2-0 CT1 TAPERPNT 27 (SUTURE) ×1 IMPLANT
TIBIAL BASE ROT PLAT SZ 5 KNEE (Knees) ×2 IMPLANT
TRAY FOLEY MTR SLVR 16FR STAT (SET/KITS/TRAYS/PACK) ×2 IMPLANT
WATER STERILE IRR 1000ML POUR (IV SOLUTION) ×4 IMPLANT

## 2021-03-27 NOTE — Plan of Care (Signed)
  Problem: Education: Goal: Knowledge of General Education information will improve Description: Including pain rating scale, medication(s)/side effects and non-pharmacologic comfort measures Outcome: Progressing   Problem: Activity: Goal: Risk for activity intolerance will decrease Outcome: Progressing   Problem: Nutrition: Goal: Adequate nutrition will be maintained Outcome: Progressing   Problem: Elimination: Goal: Will not experience complications related to bowel motility Outcome: Progressing   Problem: Pain Managment: Goal: General experience of comfort will improve Outcome: Progressing   Problem: Safety: Goal: Ability to remain free from injury will improve Outcome: Progressing   Problem: Education: Goal: Knowledge of the prescribed therapeutic regimen will improve Outcome: Progressing

## 2021-03-27 NOTE — Brief Op Note (Signed)
03/27/2021  3:39 PM  PATIENT:  [name]  60 y.o. female  PRE-OPERATIVE DIAGNOSIS:  left knee endstage osteoarthritis  POST-OPERATIVE DIAGNOSIS:  left knee endstage osteoarthritis  PROCEDURE:  Procedure(s): TOTAL KNEE ARTHROPLASTY (Left) DePuy Attune  SURGEON:  Surgeon(s) and Role:    Netta Cedars, MD - Primary  PHYSICIAN ASSISTANT:   ASSISTANTS: Ventura Bruns, PA-C   ANESTHESIA:   regional and spinal  EBL:  50 mL   BLOOD ADMINISTERED:none  DRAINS: none   LOCAL MEDICATIONS USED:  MARCAINE     SPECIMEN:  No Specimen  DISPOSITION OF SPECIMEN:  N/A  COUNTS:  YES  TOURNIQUET:   Total Tourniquet Time Documented: Thigh (Left) - 110 minutes Total: Thigh (Left) - 110 minutes   DICTATION: .Other Dictation: Dictation Number [id number]  PLAN OF CARE: Admit for overnight observation  PATIENT DISPOSITION:  PACU - hemodynamically stable.   Delay start of Pharmacological VTE agent (>24hrs) due to surgical blood loss or risk of bleeding: no

## 2021-03-27 NOTE — Anesthesia Preprocedure Evaluation (Addendum)
Anesthesia Evaluation  Patient identified by MRN, date of birth, ID band Patient awake    Reviewed: Allergy & Precautions, NPO status , Patient's Chart, lab work & pertinent test results  History of Anesthesia Complications Negative for: history of anesthetic complications  Airway Mallampati: II  TM Distance: >3 FB Neck ROM: Full    Dental  (+) Caps, Dental Advisory Given   Pulmonary sleep apnea and Continuous Positive Airway Pressure Ventilation ,  03/25/2021 SARS coronavirus NEG   breath sounds clear to auscultation       Cardiovascular hypertension, Pt. on medications (-) angina Rhythm:Regular Rate:Normal     Neuro/Psych  Headaches, Anxiety Depression Bipolar Disorder    GI/Hepatic hiatal hernia, GERD  Poorly Controlled,(+)     substance abuse  alcohol use, S/p lap band   Endo/Other  diabetes (glu 101), Oral Hypoglycemic AgentsMorbid obesity  Renal/GU      Musculoskeletal  (+) Arthritis , Osteoarthritis,    Abdominal (+) + obese,   Peds  Hematology negative hematology ROS (+)   Anesthesia Other Findings   Reproductive/Obstetrics                            Anesthesia Physical Anesthesia Plan  ASA: 3  Anesthesia Plan: Spinal   Post-op Pain Management: Regional block   Induction:   PONV Risk Score and Plan: 2 and Ondansetron and Treatment may vary due to age or medical condition  Airway Management Planned: Natural Airway and Simple Face Mask  Additional Equipment: None  Intra-op Plan:   Post-operative Plan:   Informed Consent: I have reviewed the patients History and Physical, chart, labs and discussed the procedure including the risks, benefits and alternatives for the proposed anesthesia with the patient or authorized representative who has indicated his/her understanding and acceptance.     Dental advisory given  Plan Discussed with: CRNA and Surgeon  Anesthesia  Plan Comments: (Plan routine monitors, SAB with adductor canal block for post op analgesia)       Anesthesia Quick Evaluation

## 2021-03-27 NOTE — Interval H&P Note (Signed)
History and Physical Interval Note:  03/27/2021 12:46 PM  [name]  has presented today for surgery, with the diagnosis of left knee endstage osteoarthritis.  The various methods of treatment have been discussed with the patient and family. After consideration of risks, benefits and other options for treatment, the patient has consented to  Procedure(s): TOTAL KNEE ARTHROPLASTY (Left) as a surgical intervention.  The patient's history has been reviewed, patient examined, no change in status, stable for surgery.  I have reviewed the patient's chart and labs.  Questions were answered to the patient's satisfaction.     Augustin Schooling

## 2021-03-27 NOTE — Progress Notes (Signed)
Assisted Dr. Carswell Jackson with left, ultrasound guided, adductor canal block. Side rails up, monitors on throughout procedure. See vital signs in flow sheet. Tolerated Procedure well.  

## 2021-03-27 NOTE — Care Plan (Signed)
Ortho Bundle Case Management Note  Patient Details  Name: [name] MRN: [id number] Date of Birth: April 08, 1961  L TKA on 03-27-21 DCP:  Home with husband.  1 story home with 2 ste.  DME:  No needs.  Has a RW and 3-in-1. PT:  Forestine Na on 03-30-21.                   DME Arranged:  N/A DME Agency:  NA  HH Arranged:  NA HH Agency:  NA  Additional Comments: Please contact me with any questions of if this plan should need to change.  Marianne Sofia, RN,CCM [facility]  [phone] 03/27/2021, 11:16 AM

## 2021-03-27 NOTE — Anesthesia Postprocedure Evaluation (Signed)
Anesthesia Post Note  Patient: [name]  Procedure(s) Performed: TOTAL KNEE ARTHROPLASTY (Left: Knee)     Patient location during evaluation: PACU Anesthesia Type: Spinal Level of consciousness: oriented and awake and alert Pain management: pain level controlled Vital Signs Assessment: post-procedure vital signs reviewed and stable Respiratory status: spontaneous breathing, respiratory function stable and nonlabored ventilation Cardiovascular status: blood pressure returned to baseline and stable Postop Assessment: no headache, no backache, no apparent nausea or vomiting and spinal receding Anesthetic complications: no   No notable events documented.  Last Vitals:  Vitals:   03/27/21 1730 03/27/21 1745  BP: (!) 155/90 (!) 159/86  Pulse: 64 61  Resp: 18 18  Temp:  36.5 C  SpO2: 96% 98%    Last Pain:  Vitals:   03/27/21 1745  TempSrc:   PainSc: 0-No pain                 Lidia Collum

## 2021-03-27 NOTE — Discharge Instructions (Signed)
Ice to the knee constantly.  Keep the incision covered and clean and dry for one week, then ok to get it wet in the shower.  Do exercise as instructed every hour, please to prevent stiffness.    DO NOT prop anything under the knee, it will make your knee stiff.  Prop under the ankle to encourage your knee to go straight.   Use the walker while you are up and around for balance.  Wear your support stockings 24/7 to prevent blood clots and take baby aspirin twice daily for 30 days also to prevent blood clots  Follow up with Dr Roselind Klus in two weeks in the office, call [phone] for appt   INSTRUCTIONS AFTER JOINT REPLACEMENT   Remove items at home which could result in a fall. This includes throw rugs or furniture in walking pathways ICE to the affected joint every three hours while awake for 30 minutes at a time, for at least the first 3-5 days, and then as needed for pain and swelling.  Continue to use ice for pain and swelling. You may notice swelling that will progress down to the foot and ankle.  This is normal after surgery.  Elevate your leg when you are not up walking on it.   Continue to use the breathing machine you got in the hospital (incentive spirometer) which will help keep your temperature down.  It is common for your temperature to cycle up and down following surgery, especially at night when you are not up moving around and exerting yourself.  The breathing machine keeps your lungs expanded and your temperature down.   DIET:  As you were doing prior to hospitalization, we recommend a well-balanced diet.  DRESSING / WOUND CARE / SHOWERING  You may change your dressing 3-5 days after surgery.  Then change the dressing every day with sterile gauze.  Please use good hand washing techniques before changing the dressing.  Do not use any lotions or creams on the incision until instructed by your surgeon.  ACTIVITY  Increase activity slowly as tolerated, but follow the weight  bearing instructions below.   No driving for 6 weeks or until further direction given by your physician.  You cannot drive while taking narcotics.  No lifting or carrying greater than 10 lbs. until further directed by your surgeon. Avoid periods of inactivity such as sitting longer than an hour when not asleep. This helps prevent blood clots.  You may return to work once you are authorized by your doctor.     WEIGHT BEARING   Weight bearing as tolerated with assist device (walker, cane, etc) as directed, use it as long as suggested by your surgeon or therapist, typically at least 4-6 weeks.   EXERCISES  Results after joint replacement surgery are often greatly improved when you follow the exercise, range of motion and muscle strengthening exercises prescribed by your doctor. Safety measures are also important to protect the joint from further injury. Any time any of these exercises cause you to have increased pain or swelling, decrease what you are doing until you are comfortable again and then slowly increase them. If you have problems or questions, call your caregiver or physical therapist for advice.   Rehabilitation is important following a joint replacement. After just a few days of immobilization, the muscles of the leg can become weakened and shrink (atrophy).  These exercises are designed to build up the tone and strength of the thigh and leg muscles and to   improve motion. Often times heat used for twenty to thirty minutes before working out will loosen up your tissues and help with improving the range of motion but do not use heat for the first two weeks following surgery (sometimes heat can increase post-operative swelling).   These exercises can be done on a training (exercise) mat, on the floor, on a table or on a bed. Use whatever works the best and is most comfortable for you.    Use music or television while you are exercising so that the exercises are a pleasant break in your day.  This will make your life better with the exercises acting as a break in your routine that you can look forward to.   Perform all exercises about fifteen times, three times per day or as directed.  You should exercise both the operative leg and the other leg as well.  Exercises include:   Quad Sets - Tighten up the muscle on the front of the thigh (Quad) and hold for 5-10 seconds.   Straight Leg Raises - With your knee straight (if you were given a brace, keep it on), lift the leg to 60 degrees, hold for 3 seconds, and slowly lower the leg.  Perform this exercise against resistance later as your leg gets stronger.  Leg Slides: Lying on your back, slowly slide your foot toward your buttocks, bending your knee up off the floor (only go as far as is comfortable). Then slowly slide your foot back down until your leg is flat on the floor again.  Angel Wings: Lying on your back spread your legs to the side as far apart as you can without causing discomfort.  Hamstring Strength:  Lying on your back, push your heel against the floor with your leg straight by tightening up the muscles of your buttocks.  Repeat, but this time bend your knee to a comfortable angle, and push your heel against the floor.  You may put a pillow under the heel to make it more comfortable if necessary.   A rehabilitation program following joint replacement surgery can speed recovery and prevent re-injury in the future due to weakened muscles. Contact your doctor or a physical therapist for more information on knee rehabilitation.    CONSTIPATION  Constipation is defined medically as fewer than three stools per week and severe constipation as less than one stool per week.  Even if you have a regular bowel pattern at home, your normal regimen is likely to be disrupted due to multiple reasons following surgery.  Combination of anesthesia, postoperative narcotics, change in appetite and fluid intake all can affect your bowels.   YOU MUST  use at least one of the following options; they are listed in order of increasing strength to get the job done.  They are all available over the counter, and you may need to use some, POSSIBLY even all of these options:    Drink plenty of fluids (prune juice may be helpful) and high fiber foods Colace 100 mg by mouth twice a day  Senokot for constipation as directed and as needed Dulcolax (bisacodyl), take with full glass of water  Miralax (polyethylene glycol) once or twice a day as needed.  If you have tried all these things and are unable to have a bowel movement in the first 3-4 days after surgery call either your surgeon or your primary doctor.    If you experience loose stools or diarrhea, hold the medications until you stool forms back   up.  If your symptoms do not get better within 1 week or if they get worse, check with your doctor.  If you experience "the worst abdominal pain ever" or develop nausea or vomiting, please contact the office immediately for further recommendations for treatment.   ITCHING:  If you experience itching with your medications, try taking only a single pain pill, or even half a pain pill at a time.  You can also use Benadryl over the counter for itching or also to help with sleep.   TED HOSE STOCKINGS:  Use stockings on both legs until for at least 2 weeks or as directed by physician office. They may be removed at night for sleeping.  MEDICATIONS:  See your medication summary on the "After Visit Summary" that nursing will review with you.  You may have some home medications which will be placed on hold until you complete the course of blood thinner medication.  It is important for you to complete the blood thinner medication as prescribed.  PRECAUTIONS:  If you experience chest pain or shortness of breath - call 911 immediately for transfer to the hospital emergency department.   If you develop a fever greater that 101 F, purulent drainage from wound, increased  redness or drainage from wound, foul odor from the wound/dressing, or calf pain - CONTACT YOUR SURGEON.                                                   FOLLOW-UP APPOINTMENTS:  If you do not already have a post-op appointment, please call the office for an appointment to be seen by your surgeon.  Guidelines for how soon to be seen are listed in your "After Visit Summary", but are typically between 1-4 weeks after surgery.  OTHER INSTRUCTIONS:   Knee Replacement:  Do not place pillow under knee, focus on keeping the knee straight while resting. CPM instructions: 0-90 degrees, 2 hours in the morning, 2 hours in the afternoon, and 2 hours in the evening. Place foam block, curve side up under heel at all times except when in CPM or when walking.  DO NOT modify, tear, cut, or change the foam block in any way.  POST-OPERATIVE OPIOID TAPER INSTRUCTIONS: It is important to wean off of your opioid medication as soon as possible. If you do not need pain medication after your surgery it is ok to stop day one. Opioids include: Codeine, Hydrocodone(Norco, Vicodin), Oxycodone(Percocet, oxycontin) and hydromorphone amongst others.  Long term and even short term use of opiods can cause: Increased pain response Dependence Constipation Depression Respiratory depression And more.  Withdrawal symptoms can include Flu like symptoms Nausea, vomiting And more Techniques to manage these symptoms Hydrate well Eat regular healthy meals Stay active Use relaxation techniques(deep breathing, meditating, yoga) Do Not substitute Alcohol to help with tapering If you have been on opioids for less than two weeks and do not have pain than it is ok to stop all together.  Plan to wean off of opioids This plan should start within one week post op of your joint replacement. Maintain the same interval or time between taking each dose and first decrease the dose.  Cut the total daily intake of opioids by one tablet each  day Next start to increase the time between doses. The last dose that should be eliminated is   the evening dose.   MAKE SURE YOU:  Understand these instructions.  Get help right away if you are not doing well or get worse.    Thank you for letting us be a part of your medical care team.  It is a privilege we respect greatly.  We hope these instructions will help you stay on track for a fast and full recovery!      

## 2021-03-27 NOTE — Transfer of Care (Signed)
Immediate Anesthesia Transfer of Care Note  Patient: [name]  Procedure(s) Performed: TOTAL KNEE ARTHROPLASTY (Left: Knee)  Patient Location: PACU  Anesthesia Type:Spinal  Level of Consciousness: awake, alert , oriented and patient cooperative  Airway & Oxygen Therapy: Patient Spontanous Breathing and Patient connected to face mask oxygen  Post-op Assessment: Report given to RN and Post -op Vital signs reviewed and stable  Post vital signs: stable  Last Vitals:  Vitals Value Taken Time  BP 126/88 03/27/21 1539  Temp    Pulse 98 03/27/21 1543  Resp 19 03/27/21 1543  SpO2 97 % 03/27/21 1543  Vitals shown include unvalidated device data.  Last Pain:  Vitals:   03/27/21 1235  TempSrc:   PainSc: 0-No pain      Patients Stated Pain Goal: 0 (55/00/16 4290)  Complications: No notable events documented.

## 2021-03-27 NOTE — Anesthesia Procedure Notes (Signed)
Spinal  Patient location during procedure: OR Start time: 03/27/2021 1:17 PM End time: 03/27/2021 1:20 PM Reason for block: surgical anesthesia Staffing Performed: resident/CRNA  Anesthesiologist: Annye Asa, MD Resident/CRNA: Hedda Slade, CRNA Preanesthetic Checklist Completed: patient identified, IV checked, site marked, risks and benefits discussed, surgical consent, monitors and equipment checked, pre-op evaluation and timeout performed Spinal Block Patient position: sitting Prep: ChloraPrep Patient monitoring: heart rate, continuous pulse ox, blood pressure and cardiac monitor Approach: midline Location: L3-4 Injection technique: single-shot Needle Needle type: Whitacre and Introducer  Needle gauge: 24 G Needle length: 9 cm Assessment Events: CSF return Additional Notes Negative paresthesia. Negative blood return. Positive free-flowing CSF. Expiration date of kit checked and confirmed. Patient tolerated procedure well, without complications.

## 2021-03-27 NOTE — Anesthesia Procedure Notes (Signed)
Anesthesia Regional Block: Adductor canal block   Pre-Anesthetic Checklist: , timeout performed,  Correct Patient, Correct Site, Correct Laterality,  Correct Procedure, Correct Position, site marked,  Risks and benefits discussed,  Surgical consent,  Pre-op evaluation,  At surgeon's request and post-op pain management  Laterality: Left and Lower  Prep: chloraprep       Needles:  Injection technique: Single-shot  Needle Type: Echogenic Needle     Needle Length: 9cm  Needle Gauge: 21     Additional Needles:   Procedures:,,,, ultrasound used (permanent image in chart),,    Narrative:  Start time: 03/27/2021 12:22 PM End time: 03/27/2021 12:28 PM Injection made incrementally with aspirations every 5 mL.  Performed by: Personally  Anesthesiologist: Annye Asa, MD  Additional Notes: Pt identified in Holding room.  Monitors applied. Working IV access confirmed. Sterile prep L thigh.  #21ga ECHOgenic Arrow block needle into adductor canal with US guidance.  20cc 0.5% ropivacaine injected incrementally after negative test dose.  Patient asymptomatic, VSS, no heme aspirated, tolerated well.   Jenita Seashore, MD

## 2021-03-28 DIAGNOSIS — Z79899 Other long term (current) drug therapy: Secondary | ICD-10-CM | POA: Diagnosis not present

## 2021-03-28 DIAGNOSIS — I10 Essential (primary) hypertension: Secondary | ICD-10-CM | POA: Diagnosis not present

## 2021-03-28 DIAGNOSIS — M1712 Unilateral primary osteoarthritis, left knee: Secondary | ICD-10-CM | POA: Diagnosis not present

## 2021-03-28 DIAGNOSIS — E119 Type 2 diabetes mellitus without complications: Secondary | ICD-10-CM | POA: Diagnosis not present

## 2021-03-28 LAB — BASIC METABOLIC PANEL
Anion gap: 9 (ref 5–15)
BUN: 16 mg/dL (ref 8–23)
CO2: 28 mmol/L (ref 22–32)
Calcium: 8.6 mg/dL — ABNORMAL LOW (ref 8.9–10.3)
Chloride: 98 mmol/L (ref 98–111)
Creatinine, Ser: 0.56 mg/dL (ref 0.44–1.00)
GFR, Estimated: >60 mL/min (ref >60)
Glucose, Bld: 196 mg/dL — ABNORMAL HIGH (ref 70–99)
Potassium: 4.2 mmol/L (ref 3.5–5.1)
Sodium: 135 mmol/L (ref 135–145)

## 2021-03-28 LAB — CBC
HCT: 43.3 % (ref 36.0–46.0)
Hemoglobin: 13.9 g/dL (ref 12.0–15.0)
MCH: 28.4 pg (ref 26.0–34.0)
MCHC: 32.1 g/dL (ref 30.0–36.0)
MCV: 88.4 fL (ref 80.0–100.0)
Platelets: 203 10*3/uL (ref 150–400)
RBC: 4.9 MIL/uL (ref 3.87–5.11)
RDW: 13.5 % (ref 11.5–15.5)
WBC: 11.3 10*3/uL — ABNORMAL HIGH (ref 4.0–10.5)
nRBC: 0 % (ref 0.0–0.2)

## 2021-03-28 LAB — GLUCOSE, CAPILLARY
Glucose-Capillary: 180 mg/dL — ABNORMAL HIGH (ref 70–99)
Glucose-Capillary: 184 mg/dL — ABNORMAL HIGH (ref 70–99)
Glucose-Capillary: 125 mg/dL — ABNORMAL HIGH (ref 70–99)
Glucose-Capillary: 150 mg/dL — ABNORMAL HIGH (ref 70–99)

## 2021-03-28 MED ORDER — PROPOFOL 10 MG/ML IV BOLUS
INTRAVENOUS | Status: AC
  Filled 2021-03-28: qty 40

## 2021-03-28 MED ORDER — SODIUM CHLORIDE 0.9 % IV BOLUS
500.0000 mL | Freq: Once | INTRAVENOUS | Status: AC
  Administered 2021-03-28: 500 mL via INTRAVENOUS

## 2021-03-28 NOTE — Op Note (Signed)
NAMEKSENIYA, [name] MEDICAL RECORD NO: [id number] ACCOUNT NO: [id number] DATE OF BIRTH: November 03, 1960 FACILITY: [facility] LOCATION: [id number] PHYSICIAN: Doran Heater. Veverly Fells, MD  Operative Report   DATE OF PROCEDURE: 03/27/2021  PREOPERATIVE DIAGNOSIS:  Left knee end-stage arthritis.  POSTOPERATIVE DIAGNOSIS:  Left knee end-stage arthritis.  PROCEDURE PERFORMED:  Left total knee replacement using DePuy Attune prosthesis.  ATTENDING SURGEON:  Esmond Plants, MD  ASSISTANT:  Darol Destine, [place], who was scrubbed during the entire procedure, necessary for satisfactory completion of surgery.  ANESTHESIA:  Spinal anesthesia was used plus adductor canal block.  ESTIMATED BLOOD LOSS:  Minimal.  FLUID REPLACEMENT:  1500 mL crystalloid.  Instrument counts were correct.  No complications.  Perioperative antibiotics were given.  TOURNIQUET TIME:  1 hour and 30 minutes at 325 mmHg.  INDICATIONS:  The patient is a 60 year old female with end-stage arthritis bone-on-bone.  The patient has failed conservative management including pain medications, injections, and modification of activity.  She has lost a large amount of weight in order  to get her body mass index down below 40 to where she could have a knee replacement performed.  Risks including but not limited to infection and DVT and PE were discussed with the patient.  Informed consent obtained.  DESCRIPTION OF PROCEDURE:  After an adequate level of anesthesia was achieved, the patient was positioned in the supine position.  Left leg correctly identified and a nonsterile tourniquet placed on the proximal thigh.  Left leg sterilely prepped and  draped in the usual manner.  Timeout called, verifying correct patient, correct site.  We elevated the leg and exsanguinated using Esmarch bandage.  We then elevated the tourniquet to 325 mmHg, placed the knee in flexion, performed a longitudinal midline  incision with a 10 blade scalpel.  Dissection down  through subcutaneous tissues.  We used a fresh 10 blade for the parapatellar arthrotomy.  We then divided the lateral patellofemoral ligaments, everted the patella and flexed the knee.  We identified  bone-on-bone arthritis with eburnated bone medially.  We entered the distal femur with a step cut drill.  We then placed our intramedullary resection guide and resected 11 mm off the distal femur, 5 degrees left as the patient did have a flexion  contracture. We then sized our femur to a size 6 anterior down and performed anterior, posterior and chamfer cuts with the 4-in-1 block.  We then removed ACL and PCL meniscal tissue, subluxing the tibia anteriorly.  We then performed our tibial cut 90  degrees perpendicular to the long axis of the tibia with minimal posterior slope for this posterior cruciate substituting prosthesis.  We then removed excess bone off the posterior femoral condyles with the curved osteotome. We used a lamina spreader.   We then injected the posterior capsule of the knee.  We did that on both medial and lateral sides.  Next, we checked our gaps, which were symmetric at 6 mm.  We removed our pins from our tibia and completed our tibial preparation with a modular drill and  keel punch for the 5 tibia.  We then cut our box for the 6 narrow left femur and placed that trial in place.  We drilled our lug holes.  We then placed the knee in extension with the 6 mm poly spacer.  With the 6 in, we were able to get full extension.   We had a little bit of laxity in flexion, felt like we could probably get the 8 in.  We then resurfaced the patella going from a 28 mm thickness down to an 18 mm thickness.  We used the patellar clamp to cut the patella with an oscillating saw.  We then  drilled our lug holes for the 35 patellar button.  Once we had the button trial in place, we ranged the knee and had excellent patellar tracking.  I did go ahead and do a lateral release as there was a tiny bit of tilt,  but the tracking was overall good  and then once we did our lateral release, the patella stayed flat and did not sublux at all.  At this point, we removed all the trial components.  We pulse irrigated and dried the knee.  We then vacuum mixed high viscosity cement, cemented the  components into place, the size 5 tibia, the size 6 narrow left femur.  We placed the 6 spacer in place, it was a size 6, 6 mm and placed the knee in extension.  We also used a patellar clamp to hold the patella compressed while the cement hardened.   Once the cement was hardened, we removed excess cement with quarter-inch curved osteotome.  We did a full inspection around the entire knee.  We selected the real size 6, 8 mm poly and placed it on the tibia and reduced the knee. Had a nice little pop as  the medial condyle reduced. I was able to get the knee in full extension, excellent flexion stability.  We irrigated thoroughly, injected the anterior capsule with Marcaine, Exparel and saline mixture.  We then closed the parapatellar arthrotomy with #1  Vicryl suture, followed by 2-0 Vicryl for subcutaneous closure and 4-0 Monocryl for skin.  Steri-Strips were applied followed by sterile dressing.  The patient tolerated the surgery well.   SHW D: 03/27/2021 3:46:45 pm T: 03/28/2021 2:52:00 am  JOB: [id number]/ [id number]

## 2021-03-28 NOTE — Evaluation (Signed)
Physical Therapy Evaluation Patient Details Name: [name] MRN: [id number] DOB: 1961-03-22 Today's Date: 03/28/2021  History of Present Illness  Pt s/p L TKR and with hx of DM, neuropathy and bipolar  Clinical Impression  Pt s/p L TKR and presents with decreased L LE strength/ROM and post op pain limiting functional mobility.  Pt further limited this am by symptomatic orthostatic hypotension - c/o dizziness/nausea and BP 83/55.  Pt should progress to dc home with family assist.     Recommendations for follow up therapy are one component of a multi-disciplinary discharge planning process, led by the attending physician.  Recommendations may be updated based on patient status, additional functional criteria and insurance authorization.  Follow Up Recommendations Follow physician's recommendations for discharge plan and follow up therapies    Assistance Recommended at Discharge Frequent or constant Supervision/Assistance  Functional Status Assessment Patient has had a recent decline in their functional status and demonstrates the ability to make significant improvements in function in a reasonable and predictable amount of time.  Equipment Recommendations  Rolling walker (2 wheels)    Recommendations for Other Services       Precautions / Restrictions Precautions Precautions: Fall;Knee Precaution Comments: orthostatic on eval Required Braces or Orthoses: Knee Immobilizer - Left Knee Immobilizer - Left: Discontinue once straight leg raise with < 10 degree lag Restrictions Weight Bearing Restrictions: No Other Position/Activity Restrictions: WBAT      Mobility  Bed Mobility Overal bed mobility: Needs Assistance Bed Mobility: Supine to Sit     Supine to sit: Min assist     General bed mobility comments: assist with L LE    Transfers Overall transfer level: Needs assistance Equipment used: Rolling walker (2 wheels) Transfers: Sit to/from Stand Sit to Stand: Min  assist           General transfer comment: cues for LE management and use of UEs to self assist    Ambulation/Gait Ambulation/Gait assistance: Min assist Gait Distance (Feet): 15 Feet (15' twice to/from bathroom) Assistive device: Rolling walker (2 wheels) Gait Pattern/deviations: Step-to pattern;Decreased step length - right;Decreased step length - left;Shuffle;Trunk flexed Gait velocity: decr     General Gait Details: cues for sequence, posture and position from RW; distance ltd by c/o dizziness, nausea  Stairs            Wheelchair Mobility    Modified Rankin (Stroke Patients Only)       Balance Overall balance assessment: Needs assistance Sitting-balance support: No upper extremity supported;Feet supported Sitting balance-Leahy Scale: Good     Standing balance support: Bilateral upper extremity supported Standing balance-Leahy Scale: Poor                               Pertinent Vitals/Pain Pain Assessment: 0-10 Pain Score: 6  Pain Location: L Knee Pain Descriptors / Indicators: Aching;Sore Pain Intervention(s): Limited activity within patient's tolerance;Monitored during session;Premedicated before session;Ice applied    Home Living Family/patient expects to be discharged to:: Private residence Living Arrangements: Spouse/significant other Available Help at Discharge: Family Type of Home: House         Home Layout: One level Home Equipment: [name] - single point      Prior Function Prior Level of Function : Independent/Modified Independent                     Hand Dominance        Extremity/Trunk Assessment  Upper Extremity Assessment Upper Extremity Assessment: Overall WFL for tasks assessed    Lower Extremity Assessment Lower Extremity Assessment: LLE deficits/detail    Cervical / Trunk Assessment Cervical / Trunk Assessment: Normal  Communication   Communication: No difficulties  Cognition  Arousal/Alertness: Awake/alert Behavior During Therapy: WFL for tasks assessed/performed Overall Cognitive Status: Within Functional Limits for tasks assessed                                          General Comments      Exercises     Assessment/Plan    PT Assessment Patient needs continued PT services  PT Problem List Decreased strength;Decreased range of motion;Decreased activity tolerance;Decreased balance;Decreased mobility;Decreased knowledge of use of DME;Pain       PT Treatment Interventions DME instruction;Gait training;Stair training;Therapeutic activities;Functional mobility training;Therapeutic exercise;Balance training;Patient/family education    PT Goals (Current goals can be found in the Care Plan section)  Acute Rehab PT Goals Patient Stated Goal: Regain IND PT Goal Formulation: With patient Time For Goal Achievement: 04/04/21 Potential to Achieve Goals: Good    Frequency 7X/week   Barriers to discharge        Co-evaluation               AM-PAC PT "6 Clicks" Mobility  Outcome Measure Help needed turning from your back to your side while in a flat bed without using bedrails?: A Little Help needed moving from lying on your back to sitting on the side of a flat bed without using bedrails?: A Little Help needed moving to and from a bed to a chair (including a wheelchair)?: A Little Help needed standing up from a chair using your arms (e.g., wheelchair or bedside chair)?: A Little Help needed to walk in hospital room?: Total Help needed climbing 3-5 steps with a railing? : Total 6 Click Score: 14    End of Session Equipment Utilized During Treatment: Gait belt;Left knee immobilizer Activity Tolerance: Other (comment) (orthostatic) Patient left: in chair;with call bell/phone within reach;with nursing/sitter in room Nurse Communication: Mobility status PT Visit Diagnosis: Unsteadiness on feet (R26.81);Difficulty in walking, not  elsewhere classified (R26.2);Other (comment) (orthostatic)    Time: 3785-8850 PT Time Calculation (min) (ACUTE ONLY): 21 min   Charges:   PT Evaluation $PT Eval Low Complexity: 1 Low          [place] Pager [phone] Office [phone]   Erwin Nishiyama 03/28/2021, 2:13 PM

## 2021-03-28 NOTE — Progress Notes (Addendum)
Physical Therapy Treatment Patient Details Name: [name] MRN: [id number] DOB: 1960/09/20 Today's Date: 03/28/2021   History of Present Illness Pt s/p L TKR and with hx of DM, neuropathy and bipolar    PT Comments    Pt very motivated and with no c/o nausea or dizziness this pm.  Pt up to ambulate in hall, to/from bathroom for toileting and hygiene at sink, and performed therex program.  Pt hopeful for dc home tomorrow.   Recommendations for follow up therapy are one component of a multi-disciplinary discharge planning process, led by the attending physician.  Recommendations may be updated based on patient status, additional functional criteria and insurance authorization.  Follow Up Recommendations  Follow physician's recommendations for discharge plan and follow up therapies     Assistance Recommended at Discharge Frequent or constant Supervision/Assistance  Equipment Recommendations  Rolling walker (2 wheels)    Recommendations for Other Services       Precautions / Restrictions Precautions Precautions: Fall;Knee Required Braces or Orthoses: Knee Immobilizer - Left Knee Immobilizer - Left: Discontinue once straight leg raise with < 10 degree lag Restrictions Weight Bearing Restrictions: No Other Position/Activity Restrictions: WBAT     Mobility  Bed Mobility               General bed mobility comments: Pt up in chair and requests back to same    Transfers Overall transfer level: Needs assistance Equipment used: Rolling walker (2 wheels) Transfers: Sit to/from Stand Sit to Stand: Min assist           General transfer comment: cues for LE management and use of UEs to self assist    Ambulation/Gait Ambulation/Gait assistance: Min assist Gait Distance (Feet): 112 Feet (and 15' twice to/from bathroom) Assistive device: Rolling walker (2 wheels) Gait Pattern/deviations: Step-to pattern;Decreased step length - right;Decreased step length -  left;Shuffle;Trunk flexed Gait velocity: decr     General Gait Details: cues for sequence, posture and position from RW; distance ltd by c/o dizziness, nausea   Stairs             Wheelchair Mobility    Modified Rankin (Stroke Patients Only)       Balance Overall balance assessment: Needs assistance Sitting-balance support: No upper extremity supported;Feet supported Sitting balance-Leahy Scale: Good     Standing balance support: Bilateral upper extremity supported Standing balance-Leahy Scale: Poor                              Cognition Arousal/Alertness: Awake/alert Behavior During Therapy: WFL for tasks assessed/performed Overall Cognitive Status: Within Functional Limits for tasks assessed                                          Exercises  Total Joint Exercises Ankle Circles/Pumps: AROM;Left;15 reps;Supine Quad Sets: AROM;Both;10 reps;Supine Heel Slides: AAROM;20 reps;Supine;Left Hip ABduction/ADduction: AAROM;Left;10 reps;Supine Straight Leg Raises: AAROM;Left;20 reps;Supine    General Comments        Pertinent Vitals/Pain Pain Assessment: 0-10 Pain Score: 7  Pain Location: L Knee Pain Descriptors / Indicators: Aching;Sore Pain Intervention(s): Limited activity within patient's tolerance;Monitored during session;Premedicated before session;Ice applied    Home Living                          Prior Function  PT Goals (current goals can now be found in the care plan section) Acute Rehab PT Goals Patient Stated Goal: Regain IND PT Goal Formulation: With patient Time For Goal Achievement: 04/04/21 Potential to Achieve Goals: Good Progress towards PT goals: Progressing toward goals    Frequency    7X/week      PT Plan Current plan remains appropriate    Co-evaluation              AM-PAC PT "6 Clicks" Mobility   Outcome Measure  Help needed turning from your back to your side  while in a flat bed without using bedrails?: A Little Help needed moving from lying on your back to sitting on the side of a flat bed without using bedrails?: A Little Help needed moving to and from a bed to a chair (including a wheelchair)?: A Little Help needed standing up from a chair using your arms (e.g., wheelchair or bedside chair)?: A Little Help needed to walk in hospital room?: A Little Help needed climbing 3-5 steps with a railing? : Total 6 Click Score: 16    End of Session Equipment Utilized During Treatment: Gait belt;Left knee immobilizer Activity Tolerance: Patient tolerated treatment well Patient left: in chair;with call bell/phone within reach;with nursing/sitter in room Nurse Communication: Mobility status PT Visit Diagnosis: Unsteadiness on feet (R26.81);Difficulty in walking, not elsewhere classified (R26.2);Other (comment)     Time: 4503-8882 PT Time Calculation (min) (ACUTE ONLY): 39 min  Charges:  $Gait Training: 8-22 mins $Therapeutic Exercise: 8-22 mins $Therapeutic Activity: 8-22 mins                     Debe Coder PT Acute Rehabilitation Services Pager [phone] Office [phone]    Dabney Schanz 03/28/2021, 3:18 PM

## 2021-03-28 NOTE — Progress Notes (Signed)
Orthopedics Progress Note  Subjective: Patient reports pain and some bleeding from her dressing  Objective:  Vitals:   03/28/21 0449 03/28/21 0800  BP: (!) 172/94   Pulse: 65   Resp: (!) 22   Temp: 98.4 F (36.9 C)   SpO2: 97% 94%    General: Awake and alert  Musculoskeletal: Left knee dressing changed and currently dry Neurovascularly intact  Lab Results  Component Value Date   WBC 11.3 (H) 03/28/2021   HGB 13.9 03/28/2021   HCT 43.3 03/28/2021   MCV 88.4 03/28/2021   PLT 203 03/28/2021       Component Value Date/Time   NA 135 03/28/2021 0322   NA 145 (H) 08/13/2020 0813   K 4.2 03/28/2021 0322   CL 98 03/28/2021 0322   CO2 28 03/28/2021 0322   GLUCOSE 196 (H) 03/28/2021 0322   BUN 16 03/28/2021 0322   BUN 19 08/13/2020 0813   CREATININE 0.56 03/28/2021 0322   CREATININE 0.74 03/23/2011 0400   CALCIUM 8.6 (L) 03/28/2021 0322   GFRNONAA >60 03/28/2021 0322   GFRAA >60 01/01/2020 1356    No results found for: INR, PROTIME  Assessment/Plan: POD #1 s/p Procedure(s): TOTAL KNEE ARTHROPLASTY Stable this morning. Therapy today, mobilization. Will need one additional day to get independent and safe for discharge home.  She states that she has decent help at home but needs to be pretty independent.  DVT prophylaxis, aspirin and TED/SCDs Pulmonary toilet Plan discharge to home tomorrow. Will do daily dry dressing changes until the wound is dry then change to the Aquacel.  Outpatient therapy on Monday  Doran Heater. Veverly Fells, MD 03/28/2021 8:15 AM

## 2021-03-29 DIAGNOSIS — M1712 Unilateral primary osteoarthritis, left knee: Secondary | ICD-10-CM | POA: Diagnosis not present

## 2021-03-29 DIAGNOSIS — Z79899 Other long term (current) drug therapy: Secondary | ICD-10-CM | POA: Diagnosis not present

## 2021-03-29 DIAGNOSIS — E119 Type 2 diabetes mellitus without complications: Secondary | ICD-10-CM | POA: Diagnosis not present

## 2021-03-29 DIAGNOSIS — I10 Essential (primary) hypertension: Secondary | ICD-10-CM | POA: Diagnosis not present

## 2021-03-29 LAB — CBC
HCT: 37.5 % (ref 36.0–46.0)
Hemoglobin: 12.3 g/dL (ref 12.0–15.0)
MCH: 28.8 pg (ref 26.0–34.0)
MCHC: 32.8 g/dL (ref 30.0–36.0)
MCV: 87.8 fL (ref 80.0–100.0)
Platelets: 167 10*3/uL (ref 150–400)
RBC: 4.27 MIL/uL (ref 3.87–5.11)
RDW: 13.6 % (ref 11.5–15.5)
WBC: 10.4 10*3/uL (ref 4.0–10.5)
nRBC: 0 % (ref 0.0–0.2)

## 2021-03-29 LAB — GLUCOSE, CAPILLARY: Glucose-Capillary: 128 mg/dL — ABNORMAL HIGH (ref 70–99)

## 2021-03-29 NOTE — Progress Notes (Signed)
Discharge instructions discussed with patient and family, verbalized agreement and understanding 

## 2021-03-29 NOTE — Progress Notes (Signed)
Physical Therapy Treatment Patient Details Name: [name] MRN: [id number] DOB: 05-10-1961 Today's Date: 03/29/2021   History of Present Illness Pt s/p L TKR and with hx of DM, neuropathy and bipolar    PT Comments    Pt performed therex program with assist.  OOB deferred to after bfast at pt request.   Recommendations for follow up therapy are one component of a multi-disciplinary discharge planning process, led by the attending physician.  Recommendations may be updated based on patient status, additional functional criteria and insurance authorization.  Follow Up Recommendations  Follow physician's recommendations for discharge plan and follow up therapies     Assistance Recommended at Discharge Frequent or constant Supervision/Assistance  Equipment Recommendations  Rolling walker (2 wheels)    Recommendations for Other Services       Precautions / Restrictions Precautions Precautions: Fall;Knee Knee Immobilizer - Left: Discontinue once straight leg raise with < 10 degree lag Restrictions Weight Bearing Restrictions: No Other Position/Activity Restrictions: WBAT     Mobility  Bed Mobility Overal bed mobility: Needs Assistance Bed Mobility: Supine to Sit;Sit to Supine     Supine to sit: Min assist Sit to supine: Min assist   General bed mobility comments: min assist with L LE on/off bed    Transfers Overall transfer level: Needs assistance Equipment used: Rolling walker (2 wheels) Transfers: Sit to/from Stand Sit to Stand: Min assist;Min guard           General transfer comment: cues for LE management and use of UEs to self assist    Ambulation/Gait                   Stairs             Wheelchair Mobility    Modified Rankin (Stroke Patients Only)       Balance                                            Cognition Arousal/Alertness: Awake/alert Behavior During Therapy: WFL for tasks  assessed/performed Overall Cognitive Status: Within Functional Limits for tasks assessed                                          Exercises Total Joint Exercises Ankle Circles/Pumps: AROM;Left;15 reps;Supine Quad Sets: AROM;Both;10 reps;Supine Heel Slides: AAROM;20 reps;Supine;Left Hip ABduction/ADduction: AAROM;Left;10 reps;Supine Straight Leg Raises: AAROM;Left;20 reps;Supine    General Comments        Pertinent Vitals/Pain Pain Assessment: 0-10 Pain Score: 5  Pain Location: L Knee Pain Descriptors / Indicators: Aching;Sore Pain Intervention(s): Limited activity within patient's tolerance;Monitored during session;Premedicated before session;Ice applied    Home Living                          Prior Function            PT Goals (current goals can now be found in the care plan section) Acute Rehab PT Goals Patient Stated Goal: Regain IND PT Goal Formulation: With patient Time For Goal Achievement: 04/04/21 Potential to Achieve Goals: Good Progress towards PT goals: Progressing toward goals    Frequency    7X/week      PT Plan Current plan remains appropriate    Co-evaluation  AM-PAC PT "6 Clicks" Mobility   Outcome Measure  Help needed turning from your back to your side while in a flat bed without using bedrails?: A Little Help needed moving from lying on your back to sitting on the side of a flat bed without using bedrails?: A Little Help needed moving to and from a bed to a chair (including a wheelchair)?: A Little Help needed standing up from a chair using your arms (e.g., wheelchair or bedside chair)?: A Little Help needed to walk in hospital room?: A Little Help needed climbing 3-5 steps with a railing? : Total 6 Click Score: 16    End of Session Equipment Utilized During Treatment: Gait belt;Left knee immobilizer Activity Tolerance: Patient tolerated treatment well Patient left: with call bell/phone  within reach;in bed;with family/visitor present Nurse Communication: Mobility status PT Visit Diagnosis: Unsteadiness on feet (R26.81);Difficulty in walking, not elsewhere classified (R26.2);Other (comment)     Time: 8786-7672 PT Time Calculation (min) (ACUTE ONLY): 24 min  Charges:  $Therapeutic Exercise: 8-22 mins                     [place] Pager [phone] Office [phone]    [facility] 03/29/2021, 10:39 AM

## 2021-03-29 NOTE — TOC Progression Note (Signed)
Transition of Care [facility]) - Progression Note    Patient Details  Name: [name] MRN: [id number] Date of Birth: 1960/10/12  [facility] CM/SW Contact  Marcelline Deist Donavan [place], [place] Phone Number: 03/29/2021, 12:28 PM  Clinical Narrative:   Patient for dc home today- PT requesting a walker for pt. CSW called and spoke to pt who declines- stating she has access to several at home and can have someone pull one out for her house upon arrival home. CSW offered to have one delivered to her room asap or to home if she preferred but she declines.   Eduard Clos, MSW, LCSW Clinical Social Worker            Expected Discharge Plan and Services           Expected Discharge Date: 03/29/21               DME Arranged: N/A DME Agency: NA       HH Arranged: NA HH Agency: NA         Social Determinants of Health (SDOH) Interventions    Readmission Risk Interventions No flowsheet data found.

## 2021-03-29 NOTE — Progress Notes (Signed)
Physical Therapy Treatment Patient Details Name: [name] MRN: [id number] DOB: 11/29/1960 Today's Date: 03/29/2021   History of Present Illness Pt s/p L TKR and with hx of DM, neuropathy and bipolar    PT Comments    Pt motivated and up to ambulate in halls and negotiate stairs.  Written instruction provided for HEP and stair training.  Pt eager for dc home this date.   Recommendations for follow up therapy are one component of a multi-disciplinary discharge planning process, led by the attending physician.  Recommendations may be updated based on patient status, additional functional criteria and insurance authorization.  Follow Up Recommendations  Follow physician's recommendations for discharge plan and follow up therapies     Assistance Recommended at Discharge Frequent or constant Supervision/Assistance  Equipment Recommendations  Rolling walker (2 wheels)    Recommendations for Other Services       Precautions / Restrictions Precautions Precautions: Fall;Knee Knee Immobilizer - Left: Discontinue once straight leg raise with < 10 degree lag Restrictions Weight Bearing Restrictions: No Other Position/Activity Restrictions: WBAT     Mobility  Bed Mobility Overal bed mobility: Needs Assistance Bed Mobility: Supine to Sit;Sit to Supine     Supine to sit: Min assist Sit to supine: Min assist   General bed mobility comments: min assist with L LE on/off bed    Transfers Overall transfer level: Needs assistance Equipment used: Rolling walker (2 wheels) Transfers: Sit to/from Stand Sit to Stand: Min guard;Supervision           General transfer comment: cues for LE management and use of UEs to self assist    Ambulation/Gait Ambulation/Gait assistance: Min guard;Supervision Gait Distance (Feet): 100 Feet Assistive device: Rolling walker (2 wheels) Gait Pattern/deviations: Step-to pattern;Decreased step length - right;Decreased step length -  left;Shuffle;Trunk flexed Gait velocity: decr     General Gait Details: cues for sequence, posture and position from RW   Stairs Stairs: Yes Stairs assistance: Min guard Stair Management: One rail Left;Step to pattern;Forwards;With cane Number of Stairs: 5 General stair comments: cues for seqeunce and foot/cane placement   Wheelchair Mobility    Modified Rankin (Stroke Patients Only)       Balance Overall balance assessment: Needs assistance Sitting-balance support: No upper extremity supported;Feet supported Sitting balance-Leahy Scale: Good     Standing balance support: No upper extremity supported Standing balance-Leahy Scale: Fair                              Cognition Arousal/Alertness: Awake/alert Behavior During Therapy: WFL for tasks assessed/performed Overall Cognitive Status: Within Functional Limits for tasks assessed                                          Exercises Total Joint Exercises Ankle Circles/Pumps: AROM;Left;15 reps;Supine Quad Sets: AROM;Both;10 reps;Supine Heel Slides: AAROM;20 reps;Supine;Left Hip ABduction/ADduction: AAROM;Left;10 reps;Supine Straight Leg Raises: AAROM;Left;20 reps;Supine    General Comments        Pertinent Vitals/Pain Pain Assessment: 0-10 Pain Score: 5  Pain Location: L Knee Pain Descriptors / Indicators: Aching;Sore Pain Intervention(s): Limited activity within patient's tolerance;Monitored during session;Premedicated before session    Home Living                          Prior Function  PT Goals (current goals can now be found in the care plan section) Acute Rehab PT Goals Patient Stated Goal: Regain IND PT Goal Formulation: With patient Time For Goal Achievement: 04/04/21 Potential to Achieve Goals: Good Progress towards PT goals: Progressing toward goals    Frequency    7X/week      PT Plan Current plan remains appropriate     Co-evaluation              AM-PAC PT "6 Clicks" Mobility   Outcome Measure  Help needed turning from your back to your side while in a flat bed without using bedrails?: A Little Help needed moving from lying on your back to sitting on the side of a flat bed without using bedrails?: A Little Help needed moving to and from a bed to a chair (including a wheelchair)?: A Little Help needed standing up from a chair using your arms (e.g., wheelchair or bedside chair)?: A Little Help needed to walk in hospital room?: A Little Help needed climbing 3-5 steps with a railing? : A Little 6 Click Score: 18    End of Session Equipment Utilized During Treatment: Gait belt;Left knee immobilizer Activity Tolerance: Patient tolerated treatment well Patient left: Other (comment);with call bell/phone within reach;with family/visitor present (sitting EOB) Nurse Communication: Mobility status PT Visit Diagnosis: Unsteadiness on feet (R26.81);Difficulty in walking, not elsewhere classified (R26.2);Other (comment)     Time: 4742-5956 PT Time Calculation (min) (ACUTE ONLY): 17 min  Charges:  $Gait Training: 8-22 mins $Therapeutic Exercise: 8-22 mins                     Debe Coder PT Acute Rehabilitation Services Pager [phone] Office [phone]    [facility] 03/29/2021, 12:37 PM

## 2021-03-29 NOTE — Progress Notes (Signed)
Subjective: 2 Days Post-Op Procedure(s) (LRB): TOTAL KNEE ARTHROPLASTY (Left) Patient reports pain as mild.   Tolerating PO without N/V +void, +flatus, -BM Denies CP, SOB, calf pain  +ambulation Eager to go home  Objective: Vital signs in last 24 hours: Temp:  [97.8 F (36.6 C)-98.8 F (37.1 C)] 98.1 F (36.7 C) (11/20 0646) Pulse Rate:  [68-72] 72 (11/20 0646) Resp:  [16-18] 18 (11/20 0646) BP: (121-133)/(60-87) 121/60 (11/20 0646) SpO2:  [89 %-96 %] 89 % (11/20 0646)  Intake/Output from previous day: 11/19 0701 - 11/20 0700 In: 2319.7 [P.O.:1440; I.V.:879.7] Out: 150 [Urine:150] Intake/Output this shift: No intake/output data recorded.  Recent Labs    03/28/21 0322 03/29/21 0330  HGB 13.9 12.3   Recent Labs    03/28/21 0322 03/29/21 0330  WBC 11.3* 10.4  RBC 4.90 4.27  HCT 43.3 37.5  PLT 203 167   Recent Labs    03/28/21 0322  NA 135  K 4.2  CL 98  CO2 28  BUN 16  CREATININE 0.56  GLUCOSE 196*  CALCIUM 8.6*   No results for input(s): LABPT, INR in the last 72 hours.  Neurologically intact Neurovascular intact Sensation intact distally Intact pulses distally Dorsiflexion/Plantar flexion intact Incision: dressing C/D/I No cellulitis present Compartment soft   Assessment/Plan: 2 Days Post-Op Procedure(s) (LRB): TOTAL KNEE ARTHROPLASTY (Left) Advance diet Up with therapy DVT Ppx: ASA, Teds, SCDs, ambulation Encourage IS D/C home today is she does well with PT Outpatient PT Monday  F/u as out patient with Dr. Hilton Cork 03/29/2021, 8:47 AM

## 2021-03-29 NOTE — Progress Notes (Signed)
Orthopedic Tech Progress Note Patient Details:  [name] 06/18/1960 [id number]  Ortho Devices Type of Ortho Device: Bone foam zero knee Ortho Device/Splint Location: Left Ortho Device/Splint Interventions: Ordered, Application   Post Interventions Patient Tolerated: Well Instructions Provided: Care of device  Charline Bills Julyssa Kyer 03/29/2021, 9:48 AM

## 2021-03-30 ENCOUNTER — Ambulatory Visit (HOSPITAL_COMMUNITY): Payer: Medicare Other | Attending: Orthopedic Surgery | Admitting: Physical Therapy

## 2021-03-30 ENCOUNTER — Other Ambulatory Visit: Payer: Self-pay

## 2021-03-30 ENCOUNTER — Encounter (HOSPITAL_COMMUNITY): Payer: Self-pay | Admitting: Physical Therapy

## 2021-03-30 DIAGNOSIS — M25662 Stiffness of left knee, not elsewhere classified: Secondary | ICD-10-CM | POA: Insufficient documentation

## 2021-03-30 DIAGNOSIS — M25562 Pain in left knee: Secondary | ICD-10-CM | POA: Diagnosis not present

## 2021-03-30 DIAGNOSIS — R2689 Other abnormalities of gait and mobility: Secondary | ICD-10-CM | POA: Insufficient documentation

## 2021-03-30 NOTE — Therapy (Signed)
[place], [place], [address] Phone: [phone]   Fax:  [phone]  Physical Therapy Evaluation  Patient Details  Name: AMEA MCPHAIL MRN: [id number] Date of Birth: 08/22/1960 Referring Provider (PT): Netta Cedars MD   Encounter Date: 03/30/2021   PT End of Session - 03/30/21 1525     Visit Number 1    Number of Visits 18    Date for PT Re-Evaluation 05/11/21    Authorization Type Medicare/ Tricare 2ndary    Progress Note Due on Visit 10    PT Start Time 1433    PT Stop Time 1515    PT Time Calculation (min) 42 min    Equipment Utilized During Treatment Left knee immobilizer    Activity Tolerance Patient tolerated treatment well    Behavior During Therapy [facility] for tasks assessed/performed             Past Medical History:  Diagnosis Date   Adenomatous polyp 2009   Anxiety    Arthritis    Bipolar 1 disorder ([place])    Constipation    Depression    Diabetes mellitus    Diabetes mellitus, type II ([place])    Diverticula of colon 2009   Generalized headaches    GERD (gastroesophageal reflux disease)    History of hiatal hernia    small   Hypertension    Migraine    Neuropathy    both feet   Obesity    Pelvic floor dysfunction    abnormal anorectal [facility]ometry at [facility] in 2009   Plantar fasciitis both feet   Sleep apnea    cpap setting of 3.5    Past Surgical History:  Procedure Laterality Date   ABDOMINAL HYSTERECTOMY     compete   CATARACT EXTRACTION W/PHACO Right 02/15/2017   Procedure: CATARACT EXTRACTION PHACO AND INTRAOCULAR LENS PLACEMENT ([place]);  Surgeon: Rutherford Guys, MD;  Location: [facility];  Service: Ophthalmology;  Laterality: Right;  CDE: 4.18   CATARACT EXTRACTION W/PHACO Left 03/01/2017   Procedure: CATARACT EXTRACTION PHACO AND INTRAOCULAR LENS PLACEMENT (I[facility]);  Surgeon: Rutherford Guys, MD;  Location: [facility];  Service: Ophthalmology;  Laterality: Left;  CDE: 2.56   COLON  RESECTION    03/30/2002   with end-colostomy and Hartmann's pouch   COLON SURGERY     complicated diverticulitis requiring sigmoid resection with colostomy and subsequent takedown   COLONOSCOPY  12/11/2007   Dr. Gala Romney- marginal prep, normal rectum pancolonic diverticula, adenomatous polyp   COLONOSCOPY  08/28/2003      Wide open colonic anastomosis/Scattered diverticula noted throughout colon/ Small external hemorrhoids   COLONOSCOPY  04/2012   [facility]: hyperplastic polyps, diverticulosis, ileocolonic anastomosis.   COLONOSCOPY WITH PROPOFOL N/A 06/07/2016   Procedure: COLONOSCOPY WITH PROPOFOL;  Surgeon: Daneil Dolin, MD;  Location: [facility];  Service: Endoscopy;  Laterality: N/A;  7:30 am   COLOSTOMY CLOSURE  07/10/2002   ESOPHAGOGASTRODUODENOSCOPY N/A 01/08/2020   Procedure: UPPER GASTROINTESTINAL ENDOSCOPY;  Surgeon: Alphonsa Overall, MD;  Location: [facility];  Service: General;  Laterality: N/A;   ESOPHAGOGASTRODUODENOSCOPY (EGD) WITH PROPOFOL N/A 08/23/2016   Procedure: ESOPHAGOGASTRODUODENOSCOPY (EGD) WITH PROPOFOL;  Surgeon: Alphonsa Overall, MD;  Location: [facility];  Service: General;  Laterality: N/A;   FLEXIBLE SIGMOIDOSCOPY  01/20/2012   RMR: incomplete/attempted colonoscopy. Inadequate prep precluded examination   HEEL SPUR SURGERY Left 09/11/2013   both have been done   KNEE ARTHROSCOPY  02/04/2004    left knee/partial medial  meniscectomy.   KNEE SURGERY     3 arthroscopic  2 on left 1 on right   LAPAROSCOPIC GASTRIC BANDING  2009   LAPAROSCOPIC GASTRIC SLEEVE RESECTION N/A 01/08/2020   Procedure: LAPAROSCOPIC SLEEVE GASTRECTOMY;  Surgeon: Alphonsa Overall, MD;  Location: [facility];  Service: General;  Laterality: N/A;   LAPAROSCOPIC SALPINGOOPHERECTOMY  03/30/2002   TUBAL LIGATION      There were no vitals filed for this visit.    Subjective Assessment - 03/30/21 1442     Subjective Patient presents to therapy with complaint of Lt knee pain s/p LT TKA on 03/27/21. Patient in a lot  of pain. She is taking pain medication and has been icing regularly. She has been using RW for ambulation. Currently with LT knee immobilizer.    Pertinent History Lt TKA 03/27/21    Limitations Sitting;Lifting;Standing;Walking;House hold activities    Patient Stated Goals Be able to walk better, benefit knee, improve ROM    Currently in Pain? Yes    Pain Score 9     Pain Location Knee    Pain Orientation Left    Pain Descriptors / Indicators Burning;Throbbing;Sharp    Pain Type Surgical pain    Pain Onset In the past 7 days    Pain Frequency Constant    Aggravating Factors  WB, standing, bending    Pain Relieving Factors mes, rest, ice    Effect of Pain on Daily Activities Limit                [facility] PT Assessment - 03/30/21 0001       Assessment   Medical Diagnosis LT TKA    Referring Provider (PT) Netta Cedars MD    Onset Date/Surgical Date 03/27/21    Prior Therapy Yes      Precautions   Precautions None      Restrictions   Weight Bearing Restrictions No      Balance Screen   Has the patient fallen in the past 6 months No      [place] residence    Living Arrangements Spouse/significant other      Prior Function   Level of Independence Independent      Cognition   Overall Cognitive Status Within Functional Limits for tasks assessed      Observation/Other Assessments   Observations Post op bandages intact, some signs of drainage noted but not exceesive/ soiled bandages. Mod edema noted thorughout LLE    Focus on Therapeutic Outcomes (FOTO)  21% function      ROM / Strength   AROM / PROM / Strength AROM;Strength      AROM   AROM Assessment Site Knee    Right/Left Knee Right;Left    Left Knee Extension 12    Left Knee Flexion 40      Transfers   Transfers Sit to Stand    Sit to Stand 6: Modified independent (Device/Increase time);5: Supervision    Comments Heavy use of UEs, LLE in extension, poor weight shifting       Ambulation/Gait   Ambulation/Gait Yes    Ambulation/Gait Assistance 6: Modified independent (Device/Increase time);5: Supervision    Assistive device Rolling walker    Gait Pattern Step-to pattern;Decreased step length - right;Decreased stride length;Decreased hip/knee flexion - left;Decreased stance time - left;Antalgic;Trunk flexed    Ambulation Surface Level;Indoor    Gait velocity Decreased  Objective measurements completed on examination: See above findings.                PT Education - 03/30/21 1444     Education Details on evaluation findings, POC and HEP    Person(s) Educated Patient    Methods Explanation;Handout    Comprehension Verbalized understanding              PT Short Term Goals - 03/30/21 1529       PT SHORT TERM GOAL #1   Title Patient will be independent with initial HEP and self-management strategies to improve functional outcomes    Time 3    Period Weeks    Status New    Target Date 04/20/21      PT SHORT TERM GOAL #2   Title Patient will report at least 40% overall improvement in subjective complaint to indicate improvement in ability to perform ADLs.    Time 3    Period Weeks    Status New    Target Date 04/20/21      PT SHORT TERM GOAL #3   Title Patient will have LT knee AROM 5-100 degrees to improve functional mobility and facilitate squatting to pick up items from floor.    Time 3    Period Weeks    Status New    Target Date 04/20/21               PT Long Term Goals - 03/30/21 1530       PT LONG TERM GOAL #1   Title Patient will have LT knee AROM 0-120 degrees to improve functional mobility and facilitate squatting to pick up items from floor.    Time 6    Period Weeks    Status New    Target Date 05/11/21      PT LONG TERM GOAL #2   Title Patient will report at least 75% overall improvement in subjective complaint to indicate improvement in ability to perform ADLs.     Time 6    Period Weeks    Status New    Target Date 05/11/21      PT LONG TERM GOAL #3   Title Patient will improve FOTO score to predicted value to indicate improvement in functional outcomes    Time 6    Period Weeks    Status New    Target Date 05/11/21      PT LONG TERM GOAL #4   Title Patient will have Lt knee flexion/ extension MMT equal to or > 4+/5 for improved ability to perform functional mobility, stair ambulation and ADLs.    Time 6    Period Weeks    Status New    Target Date 05/11/21      PT LONG TERM GOAL #5   Title Patient will be able to ambulate at least 375 feet during 2MWT with LRAD to demonstrate improved ability to perform functional mobility and associated tasks.    Time 6    Period Weeks    Status New    Target Date 05/11/21                    Plan - 03/30/21 1526     Clinical Impression Statement Patient is a 60 y.o. female who presents to physical therapy with complaint of Lt knee pain s/p Lt TKA on 03/27/21. Patient demonstrates decreased strength, ROM restriction, balance deficits and gait abnormalities which are likely contributing to symptoms of  pain and are negatively impacting patient ability to perform ADLs and functional mobility tasks. Patient will benefit from skilled physical therapy services to address these deficits to reduce pain, improve level of function with ADLs, functional mobility tasks, and reduce risk for falls.    Examination-Activity Limitations Bend;Squat;Stairs;Stand;Carry;Transfers;Dressing;Hygiene/Grooming;Lift;Locomotion Level;Toileting;Sleep;Bed Mobility;Bathing;Sit    Examination-Participation Restrictions Community Activity;Shop;Cleaning;Yard Work;Meal Prep;Laundry    Stability/Clinical Decision Making Stable/Uncomplicated    Clinical Decision Making Low    Rehab Potential Good    PT Frequency 3x / week    PT Duration 6 weeks    PT Treatment/Interventions ADLs/Self Care Home  Management;Biofeedback;Cryotherapy;Electrical Stimulation;Functional mobility training;Therapeutic activities;Stair training;Orthotic Fit/Training;Energy conservation;Splinting;Vasopneumatic Device;Manual techniques;Manual lymph drainage;Vestibular;Therapeutic exercise;Iontophoresis 4mg /ml Dexamethasone;Moist Heat;Traction;Balance training;Taping;Patient/family education;DME Instruction;Gait training;Contrast Bath;Neuromuscular re-education;Compression bandaging;Fluidtherapy;Parrafin;Ultrasound;Scar mobilization;Visual/perceptual remediation/compensation;Passive range of motion;Spinal Manipulations;Dry needling;Joint Manipulations    PT Next Visit Plan Review HEP. Progress knee strength and mobility exercises as tolerated. Manual for pain, swelling and mobility as needed    PT Home Exercise Plan Eval: qaud set, heel prop, glute set, heel slide, ankle pump    Consulted and Agree with Plan of Care Patient             Patient will benefit from skilled therapeutic intervention in order to improve the following deficits and impairments:  Abnormal gait, Decreased endurance, Hypomobility, Increased edema, Decreased activity tolerance, Decreased strength, Increased fascial restricitons, Pain, Decreased balance, Decreased mobility, Difficulty walking, Impaired flexibility, Improper body mechanics, Decreased range of motion  Visit Diagnosis: Left knee pain, unspecified chronicity  Stiffness of left knee, not elsewhere classified  Other abnormalities of gait and mobility     Problem List Patient Active Problem List   Diagnosis Date Noted   H/O total knee replacement, left 03/27/2021   Morbid obesity with BMI of 45.0-49.9, adult ([place]) 01/08/2020   Uncontrolled type 2 diabetes mellitus with hyperglycemia ([place]) 05/21/2019   Essential hypertension, benign 05/21/2019   Mixed hyperlipidemia 05/21/2019   Plantar fasciitis of right foot 06/14/2018   Osteoarthritis of left knee 04/05/2018    Osteoarthritis of right knee 04/05/2018   Hx of adenomatous colonic polyps 06/04/2016   LLQ pain 12/31/2014   Abdominal pain, RLQ (right lower quadrant) 04/24/2014   OSA on CPAP 10/18/2013   Alcohol abuse 01/01/2013   Migraine without aura, with intractable migraine, so stated, without mention of status migrainosus 09/21/2012   Memory loss 09/21/2012   Lapband APL Nov 2009 06/21/2012   Outbursts of anger 03/31/2012   FH: colon cancer 11/03/2011   Rectal bleed 11/03/2011   Esophageal dysphagia 11/03/2011   COLONIC POLYPS 10/10/2008   DIABETES MELLITUS, TYPE II 09/02/2008   Obesity 09/02/2008   ANXIETY NEUROSIS 09/02/2008   Severe mood disorder without psychotic features ([place]) 09/02/2008   GERD 09/02/2008   Constipation 09/02/2008   OSTEOARTHRITIS 09/02/2008   3:36 PM, 03/30/21 Josue Hector PT DPT  Physical Therapist with [facility]  [phone] [place], [place], [address] Phone: [phone]   Fax:  [phone]  Name: LATREECE MOCHIZUKI MRN: [id number] Date of Birth: 09/28/1960

## 2021-03-31 ENCOUNTER — Telehealth (HOSPITAL_COMMUNITY): Payer: Medicare Other | Admitting: Psychiatry

## 2021-03-31 NOTE — Discharge Summary (Signed)
In most cases prophylactic antibiotics for Dental procdeures after total joint surgery are not necessary.  Exceptions are as follows:  1. History of prior total joint infection  2. Severely immunocompromised (Organ Transplant, cancer chemotherapy, Rheumatoid biologic meds such as [place])  3. Poorly controlled diabetes (A1C &gt; 8.0, blood glucose over 200)  If you have one of these conditions, contact your surgeon for an antibiotic prescription, prior to your dental procedure. Orthopedic Discharge Summary        Physician Discharge Summary  Patient ID: [name] MRN: [id number] DOB/AGE: 14-Jun-1960 60 y.o.  Admit date: 03/27/2021 Discharge date: 03/29/21  Procedures:  Procedure(s) (LRB): TOTAL KNEE ARTHROPLASTY (Left)  Attending Physician:  Dr. Esmond Plants  Admission Diagnoses:   left knee end stage osteoarthritis   Discharge Diagnoses:  left knee end stage osteoarthritis   Past Medical History:  Diagnosis Date   Adenomatous polyp 2009   Anxiety    Arthritis    Bipolar 1 disorder ([place])    Constipation    Depression    Diabetes mellitus    Diabetes mellitus, type II ([place])    Diverticula of colon 2009   Generalized headaches    GERD (gastroesophageal reflux disease)    History of hiatal hernia    small   Hypertension    Migraine    Neuropathy    both feet   Obesity    Pelvic floor dysfunction    abnormal anorectal manometry at [facility] in 2009   Plantar fasciitis both feet   Sleep apnea    cpap setting of 3.5    PCP: Sharilyn Sites, MD   Discharged Condition: good  Hospital Course:  Patient underwent the above stated procedure on 03/27/2021. Patient tolerated the procedure well and brought to the recovery room in good condition and subsequently to the floor. Patient had an uncomplicated hospital course and was stable for discharge.   Disposition: Discharge disposition: 01-Home or Self Care      with follow up in 2 weeks    Follow-up  Information     Cline Crock, PA-C. Go on 04/09/2021.   Specialty: Physician Assistant Why: You are scheduled for a follow up appointment on 04-09-21 at 10:00 am. Contact information: [address] [place] [address] [phone]         Netta Cedars, MD. Call in 2 week(s).   Specialty: Orthopedic Surgery Why: call [phone] for appt Contact information: [address] [place] [address] [phone]                 Dental Antibiotics:  In most cases prophylactic antibiotics for Dental procdeures after total joint surgery are not necessary.  Exceptions are as follows:  1. History of prior total joint infection  2. Severely immunocompromised (Organ Transplant, cancer chemotherapy, Rheumatoid biologic meds such as [place])  3. Poorly controlled diabetes (A1C &gt; 8.0, blood glucose over 200)  If you have one of these conditions, contact your surgeon for an antibiotic prescription, prior to your dental procedure.    Allergies as of 03/29/2021       Reactions   Bactrim Itching, Nausea And Vomiting, Rash   Bactrim brand name. Redness.        Medication List     TAKE these medications    ALPRAZolam 1 MG tablet Commonly known as: XANAX Take 1 tablet (1 mg total) by mouth 3 (three) times daily as needed for anxiety.   ARIPiprazole 10 MG tablet  Commonly known as: ABILIFY Take 1 tablet (10 mg total) by mouth at bedtime.   ascorbic acid 500 MG tablet Commonly known as: VITAMIN C Take 500 mg by mouth daily.   aspirin 81 MG chewable tablet Commonly known as: Aspirin Childrens Chew 1 tablet (81 mg total) by mouth 2 (two) times daily.   B-D ULTRAFINE III SHORT PEN 31G X 8 MM Misc Generic drug: Insulin Pen Needle 1 each by Does not apply route as directed.   CALCIUM-VITAMIN D-VITAMIN K PO Take 1 tablet by mouth daily.   cycloSPORINE 0.05 % ophthalmic emulsion Commonly known as: RESTASIS Place 2 drops  into both eyes 2 (two) times daily.   diclofenac 75 MG EC tablet Commonly known as: VOLTAREN Take 75 mg by mouth 2 (two) times daily.   escitalopram 20 MG tablet Commonly known as: Lexapro Take 1 tablet (20 mg total) by mouth daily.   fluticasone 50 MCG/ACT nasal spray Commonly known as: FLONASE Place 2 sprays into the nose daily as needed for allergies.   FreeStyle Libre 2 Reader Kerrin Mo As directed   [employer] 2 Sensor Misc 1 Piece by Does not apply route every 14 (fourteen) days.   FreeStyle Lite Devi Use to measure glucose 4 times a day   gabapentin 100 MG capsule Commonly known as: NEURONTIN Take 1 capsule (100 mg total) by mouth 2 (two) times daily.   glucose blood test strip Test 4 times a day, she has freestyle lite meter   HYDROcodone-acetaminophen 5-325 MG tablet Commonly known as: NORCO/VICODIN Take 1 tablet by mouth every 6 (six) hours as needed for pain.   ICY HOT EX Apply 1 application topically 4 (four) times daily as needed (joint pain.).   loratadine 10 MG tablet Commonly known as: CLARITIN Take 10 mg by mouth daily.   meclizine 25 MG tablet Commonly known as: ANTIVERT Take 25 mg by mouth 2 (two) times daily as needed for dizziness.   methocarbamol 500 MG tablet Commonly known as: ROBAXIN Take 1 tablet (500 mg total) by mouth every 8 (eight) hours as needed for muscle spasms. What changed: when to take this   multivitamin with minerals Tabs tablet Take 1 tablet by mouth daily.   ondansetron 4 MG tablet Commonly known as: Zofran Take 1 tablet (4 mg total) by mouth every 8 (eight) hours as needed for nausea, vomiting or refractory nausea / vomiting.   oxyCODONE-acetaminophen 5-325 MG tablet Commonly known as: Percocet Take 1 tablet by mouth every 4 (four) hours as needed for severe pain.   pantoprazole 40 MG tablet Commonly known as: PROTONIX Take 1 tablet (40 mg total) by mouth 2 (two) times daily. What changed: when to take this    PROBIOTIC PO Take 1 tablet by mouth daily.   Propylene Glycol-Glycerin 0.6-0.6 % Soln Place 1 drop into both eyes 2 (two) times daily.   rizatriptan 10 MG disintegrating tablet Commonly known as: MAXALT-MLT Take 10 mg by mouth as needed for migraine. May repeat in 2 hours if needed   temazepam 30 MG capsule Commonly known as: Restoril Take 1 capsule (30 mg total) by mouth at bedtime as needed for sleep.   Trulicity 1.5 JZ/7.9XT Sopn Generic drug: Dulaglutide Inject 1.5 mg into the skin once a week.          Signed: Ventura Bruns 03/31/2021, 10:27 AM  [facility] is now [facility] [address], [place], [place], [place] [address] Phone: [phone] Facebook  Instagram  LinkedIn  [employer]

## 2021-04-01 ENCOUNTER — Other Ambulatory Visit: Payer: Self-pay

## 2021-04-01 ENCOUNTER — Encounter (HOSPITAL_COMMUNITY): Payer: Self-pay

## 2021-04-01 ENCOUNTER — Ambulatory Visit (HOSPITAL_COMMUNITY): Payer: Medicare Other

## 2021-04-01 DIAGNOSIS — M25562 Pain in left knee: Secondary | ICD-10-CM | POA: Diagnosis not present

## 2021-04-01 DIAGNOSIS — R2689 Other abnormalities of gait and mobility: Secondary | ICD-10-CM | POA: Diagnosis not present

## 2021-04-01 DIAGNOSIS — M25662 Stiffness of left knee, not elsewhere classified: Secondary | ICD-10-CM | POA: Diagnosis not present

## 2021-04-01 NOTE — Therapy (Signed)
[place], [place], [address] Phone: [phone]   Fax:  [phone]  Physical Therapy Treatment  Patient Details  Name: [name] MRN: [id number] Date of Birth: 14-Oct-1960 Referring Provider (PT): Netta Cedars MD   Encounter Date: 04/01/2021   PT End of Session - 04/01/21 1314     Visit Number 2    Number of Visits 18    Date for PT Re-Evaluation 05/11/21    Authorization Type Medicare/ Tricare 2ndary    Progress Note Due on Visit 10    PT Start Time 1313    PT Stop Time 1353    PT Time Calculation (min) 40 min    Equipment Utilized During Treatment Left knee immobilizer    Activity Tolerance Patient tolerated treatment well;Patient limited by pain    Behavior During Therapy [facility] for tasks assessed/performed             Past Medical History:  Diagnosis Date   Adenomatous polyp 2009   Anxiety    Arthritis    Bipolar 1 disorder ([place])    Constipation    Depression    Diabetes mellitus    Diabetes mellitus, type II ([place])    Diverticula of colon 2009   Generalized headaches    GERD (gastroesophageal reflux disease)    History of hiatal hernia    small   Hypertension    Migraine    Neuropathy    both feet   Obesity    Pelvic floor dysfunction    abnormal anorectal manometry at [facility] in 2009   Plantar fasciitis both feet   Sleep apnea    cpap setting of 3.5    Past Surgical History:  Procedure Laterality Date   ABDOMINAL HYSTERECTOMY     compete   CATARACT EXTRACTION W/PHACO Right 02/15/2017   Procedure: CATARACT EXTRACTION PHACO AND INTRAOCULAR LENS PLACEMENT ([place]);  Surgeon: Rutherford Guys, MD;  [facility]: [facility];  Service: Ophthalmology;  Laterality: Right;  CDE: 4.18   CATARACT EXTRACTION W/PHACO Left 03/01/2017   Procedure: CATARACT EXTRACTION PHACO AND INTRAOCULAR LENS PLACEMENT ([facility]);  Surgeon: Rutherford Guys, MD;  Location: [facility];  Service: Ophthalmology;  Laterality: Left;  CDE:  2.56   COLON RESECTION    03/30/2002   with end-colostomy and Hartmann's pouch   COLON SURGERY     complicated diverticulitis requiring sigmoid resection with colostomy and subsequent takedown   COLONOSCOPY  12/11/2007   Dr. Gala Romney- marginal prep, normal rectum pancolonic diverticula, adenomatous polyp   COLONOSCOPY  08/28/2003      Wide open colonic anastomosis/Scattered diverticula noted throughout colon/ Small external hemorrhoids   COLONOSCOPY  04/2012   [facility]: hyperplastic polyps, diverticulosis, ileocolonic anastomosis.   COLONOSCOPY WITH PROPOFOL N/A 06/07/2016   Procedure: COLONOSCOPY WITH PROPOFOL;  Surgeon: Daneil Dolin, MD;  [facility];  Service: Endoscopy;  Laterality: N/A;  7:30 am   COLOSTOMY CLOSURE  07/10/2002   ESOPHAGOGASTRODUODENOSCOPY N/A 01/08/2020   Procedure: UPPER GASTROINTESTINAL ENDOSCOPY;  Surgeon: Alphonsa Overall, MD;  [facility];  Service: General;  Laterality: N/A;   ESOPHAGOGASTRODUODENOSCOPY (EGD) WITH PROPOFOL N/A 08/23/2016   Procedure: ESOPHAGOGASTRODUODENOSCOPY (EGD) WITH PROPOFOL;  Surgeon: Alphonsa Overall, MD;  Location: [facility];  Service: General;  Laterality: N/A;   FLEXIBLE SIGMOIDOSCOPY  01/20/2012   RMR: incomplete/attempted colonoscopy. Inadequate prep precluded examination   HEEL SPUR SURGERY Left 09/11/2013   both have been done   KNEE ARTHROSCOPY  02/04/2004  left knee/partial medial meniscectomy.   KNEE SURGERY     3 arthroscopic  2 on left 1 on right   LAPAROSCOPIC GASTRIC BANDING  2009   LAPAROSCOPIC GASTRIC SLEEVE RESECTION N/A 01/08/2020   Procedure: LAPAROSCOPIC SLEEVE GASTRECTOMY;  Surgeon: Alphonsa Overall, MD;  [facility];  Service: General;  Laterality: N/A;   LAPAROSCOPIC SALPINGOOPHERECTOMY  03/30/2002   TOTAL KNEE ARTHROPLASTY Left 03/27/2021   Procedure: TOTAL KNEE ARTHROPLASTY;  Surgeon: Netta Cedars, MD;  [facility];  Service: Orthopedics;  Laterality: Left;   TUBAL LIGATION      There were no  vitals filed for this visit.   Subjective Assessment - 04/01/21 1313     Subjective Pt stated she continues to have high pain scale with Lt knee, current pain scale 9/10 sharp, achey, throbbing pain.  Has began exercises at home.    Pertinent History Lt TKA 03/27/21    Patient Stated Goals Be able to walk better, benefit knee, improve ROM    Currently in Pain? Yes    Pain Score 9     Pain Location Knee    Pain Orientation Left    Pain Descriptors / Indicators Burning;Throbbing;Sharp    Pain Type Surgical pain    Pain Onset In the past 7 days    Pain Frequency Constant    Aggravating Factors  WB, standing, bending    Pain Relieving Factors meds, rest, ice                [facility] PT Assessment - 04/01/21 0001       Assessment   Medical Diagnosis LT TKA    Referring Provider (PT) Netta Cedars MD    Onset Date/Surgical Date 03/27/21    Next MD Visit 04/09/21    Prior Therapy Yes      ROM / Strength   AROM / PROM / Strength AROM;Strength      AROM   AROM Assessment Site Knee    Right/Left Knee Left    Left Knee Extension 10   was 12   Left Knee Flexion 63   was 40                          [facility] Adult PT Treatment/Exercise - 04/01/21 0001       Exercises   Exercises Knee/Hip      Knee/Hip Exercises: Seated   Long Arc Quad AAROM;Left;5 reps    Heel Slides 10 reps      Knee/Hip Exercises: Supine   Quad Sets Left;10 reps    Heel Slides 15 reps    Knee Extension AROM    Knee Extension Limitations 10    Knee Flexion AROM    Knee Flexion Limitations 63    Other Supine Knee/Hip Exercises bent knee raise AAROM 10x    Other Supine Knee/Hip Exercises abd/add      Manual Therapy   Manual Therapy Edema management    Manual therapy comments Manual complete separate than rest of tx    Edema Management Decongestive techniques with LE elevated for edema control                     PT Education - 04/01/21 1335     Education Details Reviewed  goals, educated importance of HEP compliance, pt able to recall and demonstrate appropriate mechanics with current exercise program    Person(s) Educated Patient    Methods Explanation;Demonstration    Comprehension  Verbalized understanding              PT Short Term Goals - 03/30/21 1529       PT SHORT TERM GOAL #1   Title Patient will be independent with initial HEP and self-management strategies to improve functional outcomes    Time 3    Period Weeks    Status New    Target Date 04/20/21      PT SHORT TERM GOAL #2   Title Patient will report at least 40% overall improvement in subjective complaint to indicate improvement in ability to perform ADLs.    Time 3    Period Weeks    Status New    Target Date 04/20/21      PT SHORT TERM GOAL #3   Title Patient will have LT knee AROM 5-100 degrees to improve functional mobility and facilitate squatting to pick up items from floor.    Time 3    Period Weeks    Status New    Target Date 04/20/21               PT Long Term Goals - 03/30/21 1530       PT LONG TERM GOAL #1   Title Patient will have LT knee AROM 0-120 degrees to improve functional mobility and facilitate squatting to pick up items from floor.    Time 6    Period Weeks    Status New    Target Date 05/11/21      PT LONG TERM GOAL #2   Title Patient will report at least 75% overall improvement in subjective complaint to indicate improvement in ability to perform ADLs.    Time 6    Period Weeks    Status New    Target Date 05/11/21      PT LONG TERM GOAL #3   Title Patient will improve FOTO score to predicted value to indicate improvement in functional outcomes    Time 6    Period Weeks    Status New    Target Date 05/11/21      PT LONG TERM GOAL #4   Title Patient will have Lt knee flexion/ extension MMT equal to or > 4+/5 for improved ability to perform functional mobility, stair ambulation and ADLs.    Time 6    Period Weeks    Status New     Target Date 05/11/21      PT LONG TERM GOAL #5   Title Patient will be able to ambulate at least 375 feet during 2MWT with LRAD to demonstrate improved ability to perform functional mobility and associated tasks.    Time 6    Period Weeks    Status New    Target Date 05/11/21                   Plan - 04/01/21 1357     Clinical Impression Statement Review goals, educated importance of HEP compliance for maximal benefits,, pt able to recall and demonstrate appropriate mechanics.  Pt limited with high level pain.  Began session with manual decongestive techniques for edema and pain control.  Therex focus wiht knee mobility in supine and seated position that was tolerated well.  Pt making good progress with improved AROM 10-63 degrees (was 12-40 degrees initial eval.)  Reviewed RICE techniques for edema and pain control.    Examination-Activity Limitations Bend;Squat;Stairs;Stand;Carry;Transfers;Dressing;Hygiene/Grooming;Lift;Locomotion Level;Toileting;Sleep;Bed Mobility;Bathing;Sit    Examination-Participation Restrictions Community Activity;Shop;Cleaning;Yard Work;Meal Prep;Laundry  Stability/Clinical Decision Making Stable/Uncomplicated    Clinical Decision Making Low    Rehab Potential Good    PT Frequency 3x / week    PT Duration 6 weeks    PT Treatment/Interventions ADLs/Self Care Home Management;Biofeedback;Cryotherapy;Electrical Stimulation;Functional mobility training;Therapeutic activities;Stair training;Orthotic Fit/Training;Energy conservation;Splinting;Vasopneumatic Device;Manual techniques;Manual lymph drainage;Vestibular;Therapeutic exercise;Iontophoresis 4mg /ml Dexamethasone;Moist Heat;Traction;Balance training;Taping;Patient/family education;DME Instruction;Gait training;Contrast Bath;Neuromuscular re-education;Compression bandaging;Fluidtherapy;Parrafin;Ultrasound;Scar mobilization;Visual/perceptual remediation/compensation;Passive range of motion;Spinal  Manipulations;Dry needling;Joint Manipulations    PT Next Visit Plan Progress knee strength and mobility exercises as tolerated. Manual for pain, swelling and mobility as needed    PT Home Exercise Plan Eval: qaud set, heel prop, glute set, heel slide, ankle pump    Consulted and Agree with Plan of Care Patient             Patient will benefit from skilled therapeutic intervention in order to improve the following deficits and impairments:  Abnormal gait, Decreased endurance, Hypomobility, Increased edema, Decreased activity tolerance, Decreased strength, Increased fascial restricitons, Pain, Decreased balance, Decreased mobility, Difficulty walking, Impaired flexibility, Improper body mechanics, Decreased range of motion  Visit Diagnosis: Left knee pain, unspecified chronicity  Stiffness of left knee, not elsewhere classified  Other abnormalities of gait and mobility     Problem List Patient Active Problem List   Diagnosis Date Noted   H/O total knee replacement, left 03/27/2021   Morbid obesity with BMI of 45.0-49.9, adult ([place]) 01/08/2020   Uncontrolled type 2 diabetes mellitus with hyperglycemia ([place]) 05/21/2019   Essential hypertension, benign 05/21/2019   Mixed hyperlipidemia 05/21/2019   Plantar fasciitis of right foot 06/14/2018   Osteoarthritis of left knee 04/05/2018   Osteoarthritis of right knee 04/05/2018   Hx of adenomatous colonic polyps 06/04/2016   LLQ pain 12/31/2014   Abdominal pain, RLQ (right lower quadrant) 04/24/2014   OSA on CPAP 10/18/2013   Alcohol abuse 01/01/2013   Migraine without aura, with intractable migraine, so stated, without mention of status migrainosus 09/21/2012   Memory loss 09/21/2012   Lapband APL Nov 2009 06/21/2012   Outbursts of anger 03/31/2012   FH: colon cancer 11/03/2011   Rectal bleed 11/03/2011   Esophageal dysphagia 11/03/2011   COLONIC POLYPS 10/10/2008   DIABETES MELLITUS, TYPE II 09/02/2008   Obesity 09/02/2008    ANXIETY NEUROSIS 09/02/2008   Severe mood disorder without psychotic features ([place]) 09/02/2008   GERD 09/02/2008   Constipation 09/02/2008   OSTEOARTHRITIS 09/02/2008   Ihor Austin, LPTA/CLT; CBIS [phone]  Aldona Lento, PTA 04/01/2021, 2:05 PM  [place], [place], [address] Phone: [phone]   Fax:  [phone]  Name: [name] MRN: [id number] Date of Birth: 1960-11-12

## 2021-04-05 DIAGNOSIS — E1165 Type 2 diabetes mellitus with hyperglycemia: Secondary | ICD-10-CM | POA: Diagnosis not present

## 2021-04-06 ENCOUNTER — Other Ambulatory Visit: Payer: Self-pay

## 2021-04-06 ENCOUNTER — Ambulatory Visit (HOSPITAL_COMMUNITY): Payer: Medicare Other | Admitting: Physical Therapy

## 2021-04-06 DIAGNOSIS — M25562 Pain in left knee: Secondary | ICD-10-CM

## 2021-04-06 DIAGNOSIS — R2689 Other abnormalities of gait and mobility: Secondary | ICD-10-CM

## 2021-04-06 DIAGNOSIS — M25662 Stiffness of left knee, not elsewhere classified: Secondary | ICD-10-CM

## 2021-04-06 NOTE — Therapy (Signed)
[place], [place], [address] Phone: [phone]   Fax:  [phone]  Physical Therapy Treatment  Patient Details  Name: [name] MRN: [id number] Date of Birth: 04/10/1961 Referring Provider (PT): Netta Cedars MD   Encounter Date: 04/06/2021   PT End of Session - 04/06/21 1407     Visit Number 3    Number of Visits 18    Date for PT Re-Evaluation 05/11/21    Authorization Type Medicare/ Tricare 2ndary    Progress Note Due on Visit 10    PT Start Time 1352    PT Stop Time 1433    PT Time Calculation (min) 41 min    Equipment Utilized During Treatment Left knee immobilizer    Activity Tolerance Patient tolerated treatment well    Behavior During Therapy [facility] for tasks assessed/performed             Past Medical History:  Diagnosis Date   Adenomatous polyp 2009   Anxiety    Arthritis    Bipolar 1 disorder ([place])    Constipation    Depression    Diabetes mellitus    Diabetes mellitus, type II ([place])    Diverticula of colon 2009   Generalized headaches    GERD (gastroesophageal reflux disease)    History of hiatal hernia    small   Hypertension    Migraine    Neuropathy    both feet   Obesity    Pelvic floor dysfunction    abnormal anorectal manometry at [facility] in 2009   Plantar fasciitis both feet   Sleep apnea    cpap setting of 3.5    Past Surgical History:  Procedure Laterality Date   ABDOMINAL HYSTERECTOMY     compete   CATARACT EXTRACTION W/PHACO Right 02/15/2017   Procedure: CATARACT EXTRACTION PHACO AND INTRAOCULAR LENS PLACEMENT ([place]);  Surgeon: Rutherford Guys, MD;  Location: [facility];  Service: Ophthalmology;  Laterality: Right;  CDE: 4.18   CATARACT EXTRACTION W/PHACO Left 03/01/2017   Procedure: CATARACT EXTRACTION PHACO AND INTRAOCULAR LENS PLACEMENT ([facility]);  Surgeon: Rutherford Guys, MD;  Location: [facility];  Service: Ophthalmology;  Laterality: Left;  CDE: 2.56   COLON RESECTION     03/30/2002   with end-colostomy and Hartmann's pouch   COLON SURGERY     complicated diverticulitis requiring sigmoid resection with colostomy and subsequent takedown   COLONOSCOPY  12/11/2007   Dr. Gala Romney- marginal prep, normal rectum pancolonic diverticula, adenomatous polyp   COLONOSCOPY  08/28/2003      Wide open colonic anastomosis/Scattered diverticula noted throughout colon/ Small external hemorrhoids   COLONOSCOPY  04/2012   [facility]: hyperplastic polyps, diverticulosis, ileocolonic anastomosis.   COLONOSCOPY WITH PROPOFOL N/A 06/07/2016   Procedure: COLONOSCOPY WITH PROPOFOL;  Surgeon: Daneil Dolin, MD;  [facility];  Service: Endoscopy;  Laterality: N/A;  7:30 am   COLOSTOMY CLOSURE  07/10/2002   ESOPHAGOGASTRODUODENOSCOPY N/A 01/08/2020   Procedure: UPPER GASTROINTESTINAL ENDOSCOPY;  Surgeon: Alphonsa Overall, MD;  [facility];  Service: General;  Laterality: N/A;   ESOPHAGOGASTRODUODENOSCOPY (EGD) WITH PROPOFOL N/A 08/23/2016   Procedure: ESOPHAGOGASTRODUODENOSCOPY (EGD) WITH PROPOFOL;  Surgeon: Alphonsa Overall, MD;  Location[facility];  Service: General;  Laterality: N/A;   FLEXIBLE SIGMOIDOSCOPY  01/20/2012   RMR: incomplete/attempted colonoscopy. Inadequate prep precluded examination   HEEL SPUR SURGERY Left 09/11/2013   both have been done   KNEE ARTHROSCOPY  02/04/2004    left knee/partial medial  meniscectomy.   KNEE SURGERY     3 arthroscopic  2 on left 1 on right   LAPAROSCOPIC GASTRIC BANDING  2009   LAPAROSCOPIC GASTRIC SLEEVE RESECTION N/A 01/08/2020   Procedure: LAPAROSCOPIC SLEEVE GASTRECTOMY;  Surgeon: Alphonsa Overall, MD;  [facility];  Service: General;  Laterality: N/A;   LAPAROSCOPIC SALPINGOOPHERECTOMY  03/30/2002   TOTAL KNEE ARTHROPLASTY Left 03/27/2021   Procedure: TOTAL KNEE ARTHROPLASTY;  Surgeon: Netta Cedars, MD;  [facility];  Service: Orthopedics;  Laterality: Left;   TUBAL LIGATION      There were no vitals filed for this  visit.   Subjective Assessment - 04/06/21 1405     Subjective Patient says she is doing well overall. She changed her bandage last night as says it looks good. Currently donning water proof bandage. She has been compliant with HEP. Pain is improving.    Pertinent History Lt TKA 03/27/21    Limitations Sitting;Lifting;Standing;Walking;House hold activities    Patient Stated Goals Be able to walk better, benefit knee, improve ROM    Currently in Pain? Yes    Pain Score 5     Pain Location Knee    Pain Orientation Left    Pain Descriptors / Indicators Constant;Aching    Pain Type Surgical pain    Pain Onset In the past 7 days    Pain Frequency Constant                               [facility] Adult PT Treatment/Exercise - 04/06/21 0001       Knee/Hip Exercises: Supine   Quad Sets Left;15 reps    Quad Sets Limitations 5"    Heel Slides 15 reps;AAROM    Heel Slides Limitations with strap    Straight Leg Raises Left;1 set;10 reps    Knee Extension AROM    Knee Extension Limitations 6    Knee Flexion AROM    Knee Flexion Limitations 80      Manual Therapy   Manual Therapy Edema management    Manual therapy comments Manual complete separate than rest of tx    Edema Management Decongestive techniques with LE elevated for edema control                       PT Short Term Goals - 03/30/21 1529       PT SHORT TERM GOAL #1   Title Patient will be independent with initial HEP and self-management strategies to improve functional outcomes    Time 3    Period Weeks    Status New    Target Date 04/20/21      PT SHORT TERM GOAL #2   Title Patient will report at least 40% overall improvement in subjective complaint to indicate improvement in ability to perform ADLs.    Time 3    Period Weeks    Status New    Target Date 04/20/21      PT SHORT TERM GOAL #3   Title Patient will have LT knee AROM 5-100 degrees to improve functional mobility and  facilitate squatting to pick up items from floor.    Time 3    Period Weeks    Status New    Target Date 04/20/21               PT Long Term Goals - 03/30/21 1530       PT  LONG TERM GOAL #1   Title Patient will have LT knee AROM 0-120 degrees to improve functional mobility and facilitate squatting to pick up items from floor.    Time 6    Period Weeks    Status New    Target Date 05/11/21      PT LONG TERM GOAL #2   Title Patient will report at least 75% overall improvement in subjective complaint to indicate improvement in ability to perform ADLs.    Time 6    Period Weeks    Status New    Target Date 05/11/21      PT LONG TERM GOAL #3   Title Patient will improve FOTO score to predicted value to indicate improvement in functional outcomes    Time 6    Period Weeks    Status New    Target Date 05/11/21      PT LONG TERM GOAL #4   Title Patient will have Lt knee flexion/ extension MMT equal to or > 4+/5 for improved ability to perform functional mobility, stair ambulation and ADLs.    Time 6    Period Weeks    Status New    Target Date 05/11/21      PT LONG TERM GOAL #5   Title Patient will be able to ambulate at least 375 feet during 2MWT with LRAD to demonstrate improved ability to perform functional mobility and associated tasks.    Time 6    Period Weeks    Status New    Target Date 05/11/21                   Plan - 04/06/21 1630     Clinical Impression Statement Patient demos ongoing swelling in knee. Performed manual edema message which improved symptoms. Patient shows good improvements in knee AROM and good return of prior ther ex. Added SLR for LE strength progression. Discussed spending less time in immobilizer and only using when needed for protection, possibly during sleep, to encourage more natural movement during gait. Patient cued on proper form with added exercises. Patient will continue to benefit from skilled therapy services to  progress knee strength and mobility for reduced pain and improved functional mobility.    Examination-Activity Limitations Bend;Squat;Stairs;Stand;Carry;Transfers;Dressing;Hygiene/Grooming;Lift;Locomotion Level;Toileting;Sleep;Bed Mobility;Bathing;Sit    Examination-Participation Restrictions Community Activity;Shop;Cleaning;Yard Work;Meal Prep;Laundry    Stability/Clinical Decision Making Stable/Uncomplicated    Rehab Potential Good    PT Frequency 3x / week    PT Duration 6 weeks    PT Treatment/Interventions ADLs/Self Care Home Management;Biofeedback;Cryotherapy;Electrical Stimulation;Functional mobility training;Therapeutic activities;Stair training;Orthotic Fit/Training;Energy conservation;Splinting;Vasopneumatic Device;Manual techniques;Manual lymph drainage;Vestibular;Therapeutic exercise;Iontophoresis 4mg /ml Dexamethasone;Moist Heat;Traction;Balance training;Taping;Patient/family education;DME Instruction;Gait training;Contrast Bath;Neuromuscular re-education;Compression bandaging;Fluidtherapy;Parrafin;Ultrasound;Scar mobilization;Visual/perceptual remediation/compensation;Passive range of motion;Spinal Manipulations;Dry needling;Joint Manipulations    PT Next Visit Plan Progress knee strength and mobility exercises as tolerated. Manual for pain, swelling and mobility as needed    PT Home Exercise Plan Eval: qaud set, heel prop, glute set, heel slide, ankle pump 11/28 SLR    Consulted and Agree with Plan of Care Patient             Patient will benefit from skilled therapeutic intervention in order to improve the following deficits and impairments:  Abnormal gait, Decreased endurance, Hypomobility, Increased edema, Decreased activity tolerance, Decreased strength, Increased fascial restricitons, Pain, Decreased balance, Decreased mobility, Difficulty walking, Impaired flexibility, Improper body mechanics, Decreased range of motion  Visit Diagnosis: Left knee pain, unspecified  chronicity  Stiffness of left knee, not elsewhere classified  Other  abnormalities of gait and mobility     Problem List Patient Active Problem List   Diagnosis Date Noted   H/O total knee replacement, left 03/27/2021   Morbid obesity with BMI of 45.0-49.9, adult ([place]) 01/08/2020   Uncontrolled type 2 diabetes mellitus with hyperglycemia ([place]) 05/21/2019   Essential hypertension, benign 05/21/2019   Mixed hyperlipidemia 05/21/2019   Plantar fasciitis of right foot 06/14/2018   Osteoarthritis of left knee 04/05/2018   Osteoarthritis of right knee 04/05/2018   Hx of adenomatous colonic polyps 06/04/2016   LLQ pain 12/31/2014   Abdominal pain, RLQ (right lower quadrant) 04/24/2014   OSA on CPAP 10/18/2013   Alcohol abuse 01/01/2013   Migraine without aura, with intractable migraine, so stated, without mention of status migrainosus 09/21/2012   Memory loss 09/21/2012   Lapband APL Nov 2009 06/21/2012   Outbursts of anger 03/31/2012   FH: colon cancer 11/03/2011   Rectal bleed 11/03/2011   Esophageal dysphagia 11/03/2011   COLONIC POLYPS 10/10/2008   DIABETES MELLITUS, TYPE II 09/02/2008   Obesity 09/02/2008   ANXIETY NEUROSIS 09/02/2008   Severe mood disorder without psychotic features ([place]) 09/02/2008   GERD 09/02/2008   Constipation 09/02/2008   OSTEOARTHRITIS 09/02/2008   4:34 PM, 04/06/21 Josue Hector PT DPT  Physical Therapist with [facility]  [phone] [place] [address] [place], [place], [address] Phone: [phone]   Fax:  [phone]  Name: [name] MRN: [id number] Date of Birth: 05-Jun-1960

## 2021-04-08 ENCOUNTER — Encounter (HOSPITAL_COMMUNITY): Payer: Self-pay | Admitting: Physical Therapy

## 2021-04-08 ENCOUNTER — Ambulatory Visit (HOSPITAL_COMMUNITY): Payer: Medicare Other | Admitting: Physical Therapy

## 2021-04-08 ENCOUNTER — Other Ambulatory Visit: Payer: Self-pay

## 2021-04-08 DIAGNOSIS — M25662 Stiffness of left knee, not elsewhere classified: Secondary | ICD-10-CM

## 2021-04-08 DIAGNOSIS — R2689 Other abnormalities of gait and mobility: Secondary | ICD-10-CM | POA: Diagnosis not present

## 2021-04-08 DIAGNOSIS — M25562 Pain in left knee: Secondary | ICD-10-CM | POA: Diagnosis not present

## 2021-04-08 NOTE — Patient Instructions (Signed)
Access Code: [email or web address] URL: [email or web address] Date: 04/08/2021 Prepared by: Josue Hector  Exercises Seated Hamstring Stretch - 3 x daily - 7 x weekly - 1 sets - 3 reps - 30 second hold Heel Raises with Counter Support - 3 x daily - 7 x weekly - 2 sets - 10 reps

## 2021-04-08 NOTE — Therapy (Signed)
[place], [place], [address] Phone: [phone]   Fax:  [phone]  Physical Therapy Treatment  Patient Details  Name: [name] MRN: [id number] Date of Birth: 1961/02/19 Referring Provider (PT): Netta Cedars MD   Encounter Date: 04/08/2021   PT End of Session - 04/08/21 1345     Visit Number 4    Number of Visits 18    Date for PT Re-Evaluation 05/11/21    Authorization Type Medicare/ Tricare 2ndary    Progress Note Due on Visit 10    PT Start Time 1340    PT Stop Time 1426    PT Time Calculation (min) 46 min    Equipment Utilized During Treatment Left knee immobilizer    Activity Tolerance Patient tolerated treatment well    Behavior During Therapy [facility] for tasks assessed/performed             Past Medical History:  Diagnosis Date   Adenomatous polyp 2009   Anxiety    Arthritis    Bipolar 1 disorder ([place])    Constipation    Depression    Diabetes mellitus    Diabetes mellitus, type II ([place])    Diverticula of colon 2009   Generalized headaches    GERD (gastroesophageal reflux disease)    History of hiatal hernia    small   Hypertension    Migraine    Neuropathy    both feet   Obesity    Pelvic floor dysfunction    abnormal anorectal manometry at [facility] in 2009   Plantar fasciitis both feet   Sleep apnea    cpap setting of 3.5    Past Surgical History:  Procedure Laterality Date   ABDOMINAL HYSTERECTOMY     compete   CATARACT EXTRACTION W/PHACO Right 02/15/2017   Procedure: CATARACT EXTRACTION PHACO AND INTRAOCULAR LENS PLACEMENT ([place]);  Surgeon: Rutherford Guys, MD;  [facility]: [facility];  Service: Ophthalmology;  Laterality: Right;  CDE: 4.18   CATARACT EXTRACTION W/PHACO Left 03/01/2017   Procedure: CATARACT EXTRACTION PHACO AND INTRAOCULAR LENS PLACEMENT ([facility]);  Surgeon: Rutherford Guys, MD;  Location: [facility];  Service: Ophthalmology;  Laterality: Left;  CDE: 2.56   COLON  RESECTION    03/30/2002   with end-colostomy and Hartmann's pouch   COLON SURGERY     complicated diverticulitis requiring sigmoid resection with colostomy and subsequent takedown   COLONOSCOPY  12/11/2007   Dr. Gala Romney- marginal prep, normal rectum pancolonic diverticula, adenomatous polyp   COLONOSCOPY  08/28/2003      Wide open colonic anastomosis/Scattered diverticula noted throughout colon/ Small external hemorrhoids   COLONOSCOPY  04/2012   [facility]: hyperplastic polyps, diverticulosis, ileocolonic anastomosis.   COLONOSCOPY WITH PROPOFOL N/A 06/07/2016   Procedure: COLONOSCOPY WITH PROPOFOL;  Surgeon: Daneil Dolin, MD;  [facility];  Service: Endoscopy;  Laterality: N/A;  7:30 am   COLOSTOMY CLOSURE  07/10/2002   ESOPHAGOGASTRODUODENOSCOPY N/A 01/08/2020   Procedure: UPPER GASTROINTESTINAL ENDOSCOPY;  Surgeon: Alphonsa Overall, MD;  [facility];  Service: General;  Laterality: N/A;   ESOPHAGOGASTRODUODENOSCOPY (EGD) WITH PROPOFOL N/A 08/23/2016   Procedure: ESOPHAGOGASTRODUODENOSCOPY (EGD) WITH PROPOFOL;  Surgeon: Alphonsa Overall, MD;  Location: [facility];  Service: General;  Laterality: N/A;   FLEXIBLE SIGMOIDOSCOPY  01/20/2012   RMR: incomplete/attempted colonoscopy. Inadequate prep precluded examination   HEEL SPUR SURGERY Left 09/11/2013   both have been done   KNEE ARTHROSCOPY  02/04/2004    left knee/partial medial  meniscectomy.   KNEE SURGERY     3 arthroscopic  2 on left 1 on right   LAPAROSCOPIC GASTRIC BANDING  2009   LAPAROSCOPIC GASTRIC SLEEVE RESECTION N/A 01/08/2020   Procedure: LAPAROSCOPIC SLEEVE GASTRECTOMY;  Surgeon: Alphonsa Overall, MD;  [facility];  Service: General;  Laterality: N/A;   LAPAROSCOPIC SALPINGOOPHERECTOMY  03/30/2002   TOTAL KNEE ARTHROPLASTY Left 03/27/2021   Procedure: TOTAL KNEE ARTHROPLASTY;  Surgeon: Netta Cedars, MD;  [facility];  Service: Orthopedics;  Laterality: Left;   TUBAL LIGATION      There were no vitals filed for  this visit.   Subjective Assessment - 04/08/21 1344     Subjective Patient notes some increased knee pain today, but she has been doing more and been more active. She had a busy day today and has not been able to ice yet. Otherwise knee is doing good.    Pertinent History Lt TKA 03/27/21    Limitations Sitting;Lifting;Standing;Walking;House hold activities    Patient Stated Goals Be able to walk better, benefit knee, improve ROM    Currently in Pain? Yes    Pain Score 7     Pain Location Knee    Pain Orientation Left    Pain Descriptors / Indicators Aching;Sore    Pain Type Surgical pain    Pain Onset In the past 7 days    Pain Frequency Constant                               [facility] Adult PT Treatment/Exercise - 04/08/21 0001       Knee/Hip Exercises: Standing   Heel Raises Both;2 sets;10 reps    Gait Training 226 feet with walking stick, cues on cane in RT hand and heel/ toe transition    Other Standing Knee Exercises semi tandem stance 3 x 15"      Knee/Hip Exercises: Seated   Sit to Sand 2 sets;5 reps;without UE support   from elevated mat     Knee/Hip Exercises: Supine   Quad Sets Left;15 reps    Quad Sets Limitations 5"    Heel Slides 15 reps;AAROM    Heel Slides Limitations with strap    Straight Leg Raises Left;10 reps;2 sets    Knee Extension AROM    Knee Extension Limitations 6    Knee Flexion AROM    Knee Flexion Limitations 88    Other Supine Knee/Hip Exercises glute set 15 x 5"      Manual Therapy   Manual Therapy Edema management    Manual therapy comments Manual complete separate than rest of tx    Edema Management Decongestive techniques with LE elevated for edema control                       PT Short Term Goals - 03/30/21 1529       PT SHORT TERM GOAL #1   Title Patient will be independent with initial HEP and self-management strategies to improve functional outcomes    Time 3    Period Weeks    Status New     Target Date 04/20/21      PT SHORT TERM GOAL #2   Title Patient will report at least 40% overall improvement in subjective complaint to indicate improvement in ability to perform ADLs.    Time 3    Period Weeks    Status New  Target Date 04/20/21      PT SHORT TERM GOAL #3   Title Patient will have LT knee AROM 5-100 degrees to improve functional mobility and facilitate squatting to pick up items from floor.    Time 3    Period Weeks    Status New    Target Date 04/20/21               PT Long Term Goals - 03/30/21 1530       PT LONG TERM GOAL #1   Title Patient will have LT knee AROM 0-120 degrees to improve functional mobility and facilitate squatting to pick up items from floor.    Time 6    Period Weeks    Status New    Target Date 05/11/21      PT LONG TERM GOAL #2   Title Patient will report at least 75% overall improvement in subjective complaint to indicate improvement in ability to perform ADLs.    Time 6    Period Weeks    Status New    Target Date 05/11/21      PT LONG TERM GOAL #3   Title Patient will improve FOTO score to predicted value to indicate improvement in functional outcomes    Time 6    Period Weeks    Status New    Target Date 05/11/21      PT LONG TERM GOAL #4   Title Patient will have Lt knee flexion/ extension MMT equal to or > 4+/5 for improved ability to perform functional mobility, stair ambulation and ADLs.    Time 6    Period Weeks    Status New    Target Date 05/11/21      PT LONG TERM GOAL #5   Title Patient will be able to ambulate at least 375 feet during 2MWT with LRAD to demonstrate improved ability to perform functional mobility and associated tasks.    Time 6    Period Weeks    Status New    Target Date 05/11/21                   Plan - 04/08/21 1422     Clinical Impression Statement Patient doing well, steady improvement in AROM. Added hamstring stretching for knee extensions. Introduced tandem stance  for balance. Patient did well with this. Practiced gait training with walking stick. Patient educated on sequencing and use of cane in RT hand for max benefit. Patient will continue to benefit from skilled therapy services to reduce deficits and improve functional ability.    Examination-Activity Limitations Bend;Squat;Stairs;Stand;Carry;Transfers;Dressing;Hygiene/Grooming;Lift;Locomotion Level;Toileting;Sleep;Bed Mobility;Bathing;Sit    Examination-Participation Restrictions Community Activity;Shop;Cleaning;Yard Work;Meal Prep;Laundry    Stability/Clinical Decision Making Stable/Uncomplicated    Rehab Potential Good    PT Frequency 3x / week    PT Duration 6 weeks    PT Treatment/Interventions ADLs/Self Care Home Management;Biofeedback;Cryotherapy;Electrical Stimulation;Functional mobility training;Therapeutic activities;Stair training;Orthotic Fit/Training;Energy conservation;Splinting;Vasopneumatic Device;Manual techniques;Manual lymph drainage;Vestibular;Therapeutic exercise;Iontophoresis 4mg /ml Dexamethasone;Moist Heat;Traction;Balance training;Taping;Patient/family education;DME Instruction;Gait training;Contrast Bath;Neuromuscular re-education;Compression bandaging;Fluidtherapy;Parrafin;Ultrasound;Scar mobilization;Visual/perceptual remediation/compensation;Passive range of motion;Spinal Manipulations;Dry needling;Joint Manipulations    PT Next Visit Plan Progress knee strength and mobility exercises as tolerated. Manual for pain, swelling and mobility as needed    PT Home Exercise Plan Eval: qaud set, heel prop, glute set, heel slide, ankle pump 11/28 SLR 11/30 heel raise, seated hamstring stretch    Consulted and Agree with Plan of Care Patient             Patient will  benefit from skilled therapeutic intervention in order to improve the following deficits and impairments:  Abnormal gait, Decreased endurance, Hypomobility, Increased edema, Decreased activity tolerance, Decreased strength,  Increased fascial restricitons, Pain, Decreased balance, Decreased mobility, Difficulty walking, Impaired flexibility, Improper body mechanics, Decreased range of motion  Visit Diagnosis: Left knee pain, unspecified chronicity  Stiffness of left knee, not elsewhere classified  Other abnormalities of gait and mobility     Problem List Patient Active Problem List   Diagnosis Date Noted   H/O total knee replacement, left 03/27/2021   Morbid obesity with BMI of 45.0-49.9, adult ([place]) 01/08/2020   Uncontrolled type 2 diabetes mellitus with hyperglycemia ([place]) 05/21/2019   Essential hypertension, benign 05/21/2019   Mixed hyperlipidemia 05/21/2019   Plantar fasciitis of right foot 06/14/2018   Osteoarthritis of left knee 04/05/2018   Osteoarthritis of right knee 04/05/2018   Hx of adenomatous colonic polyps 06/04/2016   LLQ pain 12/31/2014   Abdominal pain, RLQ (right lower quadrant) 04/24/2014   OSA on CPAP 10/18/2013   Alcohol abuse 01/01/2013   Migraine without aura, with intractable migraine, so stated, without mention of status migrainosus 09/21/2012   Memory loss 09/21/2012   Lapband APL Nov 2009 06/21/2012   Outbursts of anger 03/31/2012   FH: colon cancer 11/03/2011   Rectal bleed 11/03/2011   Esophageal dysphagia 11/03/2011   COLONIC POLYPS 10/10/2008   DIABETES MELLITUS, TYPE II 09/02/2008   Obesity 09/02/2008   ANXIETY NEUROSIS 09/02/2008   Severe mood disorder without psychotic features ([place]) 09/02/2008   GERD 09/02/2008   Constipation 09/02/2008   OSTEOARTHRITIS 09/02/2008   3:13 PM, 04/08/21 Josue Hector PT DPT  Physical Therapist with [facility]  [phone] [place] [address] [place], [place], [address] Phone: [phone]   Fax:  [phone]  Name: [name] MRN: [id number] Date of Birth: July 29, 1960

## 2021-04-09 DIAGNOSIS — Z4789 Encounter for other orthopedic aftercare: Secondary | ICD-10-CM | POA: Diagnosis not present

## 2021-04-10 ENCOUNTER — Ambulatory Visit (HOSPITAL_COMMUNITY): Payer: Medicare Other | Admitting: Physical Therapy

## 2021-04-13 ENCOUNTER — Encounter (HOSPITAL_COMMUNITY): Payer: Self-pay

## 2021-04-13 ENCOUNTER — Ambulatory Visit (HOSPITAL_COMMUNITY): Payer: Medicare Other | Attending: Orthopedic Surgery

## 2021-04-13 ENCOUNTER — Other Ambulatory Visit: Payer: Self-pay

## 2021-04-13 DIAGNOSIS — R2689 Other abnormalities of gait and mobility: Secondary | ICD-10-CM | POA: Diagnosis not present

## 2021-04-13 DIAGNOSIS — M25562 Pain in left knee: Secondary | ICD-10-CM | POA: Diagnosis not present

## 2021-04-13 DIAGNOSIS — M25662 Stiffness of left knee, not elsewhere classified: Secondary | ICD-10-CM | POA: Insufficient documentation

## 2021-04-13 NOTE — Therapy (Signed)
[name], [place], [address] Phone: [phone]   Fax:  [phone]  Physical Therapy Treatment  Patient Details  Name: [name] MRN: [id number] Date of Birth: [birth year] Referring Provider (PT): Netta Cedars MD   Encounter Date: 04/13/2021   PT End of Session - 04/13/21 1742     Visit Number 5    Number of Visits 18    Date for PT Re-Evaluation 05/11/21    Authorization Type Medicare/ Tricare 2ndary    Progress Note Due on Visit 10    PT Start Time 1734    PT Stop Time 1815    PT Time Calculation (min) 41 min    Activity Tolerance Patient tolerated treatment well    Behavior During Therapy [facility] for tasks assessed/performed             Past Medical History:  Diagnosis Date   Adenomatous polyp 2009   Anxiety    Arthritis    Bipolar 1 disorder ([place])    Constipation    Depression    Diabetes mellitus    Diabetes mellitus, type II ([place])    Diverticula of colon 2009   Generalized headaches    GERD (gastroesophageal reflux disease)    History of hiatal hernia    small   Hypertension    Migraine    Neuropathy    both feet   Obesity    Pelvic floor dysfunction    abnormal anorectal manometry at [facility] in 2009   Plantar fasciitis both feet   Sleep apnea    cpap setting of 3.5    Past Surgical History:  Procedure Laterality Date   ABDOMINAL HYSTERECTOMY     compete   CATARACT EXTRACTION W/PHACO Right 02/15/2017   Procedure: CATARACT EXTRACTION PHACO AND INTRAOCULAR LENS PLACEMENT ([place]);  Surgeon: Rutherford Guys, MD;  Location: [facility];  Service: Ophthalmology;  Laterality: Right;  CDE: 4.18   CATARACT EXTRACTION W/PHACO Left 03/01/2017   Procedure: CATARACT EXTRACTION PHACO AND INTRAOCULAR LENS PLACEMENT ([facility]);  Surgeon: Rutherford Guys, MD;  Location: [facility];  Service: Ophthalmology;  Laterality: Left;  CDE: 2.56   COLON RESECTION    03/30/2002   with end-colostomy and Hartmann's pouch    COLON SURGERY     complicated diverticulitis requiring sigmoid resection with colostomy and subsequent takedown   COLONOSCOPY  12/11/2007   Dr. Gala Romney- marginal prep, normal rectum pancolonic diverticula, adenomatous polyp   COLONOSCOPY  08/28/2003      Wide open colonic anastomosis/Scattered diverticula noted throughout colon/ Small external hemorrhoids   COLONOSCOPY  04/2012   [facility]: hyperplastic polyps, diverticulosis, ileocolonic anastomosis.   COLONOSCOPY WITH PROPOFOL N/A 06/07/2016   Procedure: COLONOSCOPY WITH PROPOFOL;  Surgeon: Daneil Dolin, MD;  [facility];  Service: Endoscopy;  Laterality: N/A;  7:30 am   COLOSTOMY CLOSURE  07/10/2002   ESOPHAGOGASTRODUODENOSCOPY N/A 01/08/2020   Procedure: UPPER GASTROINTESTINAL ENDOSCOPY;  Surgeon: Alphonsa Overall, MD;  [facility];  Service: General;  Laterality: N/A;   ESOPHAGOGASTRODUODENOSCOPY (EGD) WITH PROPOFOL N/A 08/23/2016   Procedure: ESOPHAGOGASTRODUODENOSCOPY (EGD) WITH PROPOFOL;  Surgeon: Alphonsa Overall, MD;  [facility];  Service: General;  Laterality: N/A;   FLEXIBLE SIGMOIDOSCOPY  01/20/2012   RMR: incomplete/attempted colonoscopy. Inadequate prep precluded examination   HEEL SPUR SURGERY Left 09/11/2013   both have been done   KNEE ARTHROSCOPY  02/04/2004    left knee/partial medial meniscectomy.   KNEE SURGERY     3  arthroscopic  2 on left 1 on right   LAPAROSCOPIC GASTRIC BANDING  2009   LAPAROSCOPIC GASTRIC SLEEVE RESECTION N/A 01/08/2020   Procedure: LAPAROSCOPIC SLEEVE GASTRECTOMY;  Surgeon: Alphonsa Overall, MD;  [facility];  Service: General;  Laterality: N/A;   LAPAROSCOPIC SALPINGOOPHERECTOMY  03/30/2002   TOTAL KNEE ARTHROPLASTY Left 03/27/2021   Procedure: TOTAL KNEE ARTHROPLASTY;  Surgeon: Netta Cedars, MD;  [facility];  Service: Orthopedics;  Laterality: Left;   TUBAL LIGATION      There were no vitals filed for this visit.   Subjective Assessment - 04/13/21 1739     Subjective  Saw MD last Thursday and happy with progress.  Arrived today without AD, no reoprts of recent falls.  Reports Lt knee feels better than Rt now.    Pertinent History Lt TKA 03/27/21    Patient Stated Goals Be able to walk better, benefit knee, improve ROM    Currently in Pain? Yes    Pain Score 4     Pain Location Knee    Pain Orientation Left;Right   Lt 4/10, Rt 5/10   Pain Descriptors / Indicators Aching;Sore;Tightness    Pain Type Surgical pain    Pain Onset In the past 7 days    Pain Frequency Constant    Aggravating Factors  WB, standing, bending    Pain Relieving Factors meds, rest, ice    Effect of Pain on Daily Activities limits                [facility] PT Assessment - 04/13/21 0001       Assessment   Medical Diagnosis LT TKA    Referring Provider (PT) Netta Cedars MD    Onset Date/Surgical Date 03/27/21    Next MD Visit 06/03/20    Prior Therapy Yes                           [place] Adult PT Treatment/Exercise - 04/13/21 0001       Ambulation/Gait   Ambulation/Gait Yes    Ambulation/Gait Assistance 6: Modified independent (Device/Increase time);5: Supervision    Ambulation Distance (Feet) 200 Feet    Assistive device None    Gait Pattern Step-through pattern;Decreased arm swing - right;Decreased arm swing - left;Decreased stance time - left;Decreased step length - right    Ambulation Surface Level;Indoor    Gait velocity Decreased    Gait Comments Cueing for appropriate arm swing      Exercises   Exercises Knee/Hip      Knee/Hip Exercises: Stretches   Active Hamstring Stretch Left;5 reps;20 seconds    Active Hamstring Stretch Limitations standing wiht 12in step    Knee: Self-Stretch to increase Flexion 5 reps;10 seconds    Knee: Self-Stretch Limitations knee drive on 70WU step    Gastroc Stretch 2 reps;30 seconds    Gastroc Stretch Limitations slant board      Knee/Hip Exercises: Aerobic   Stationary Bike 5' seat 10 initial rocking then full  revolution      Knee/Hip Exercises: Standing   Heel Raises 10 reps    Knee Flexion 5 reps    Terminal Knee Extension 10 reps;Theraband    Theraband Level (Terminal Knee Extension) Level 3 (Green)    Terminal Knee Extension Limitations 5" holds    Gait Training 283ft wiht cueing for proper arm swing      Knee/Hip Exercises: Supine   Quad Sets Left;10 reps    [employer]  Sets Limitations 5" holds    Heel Slides 10 reps;AROM    Knee Extension AROM    Knee Extension Limitations 6    Knee Flexion AROM    Knee Flexion Limitations 101      Manual Therapy   Manual Therapy Edema management    Manual therapy comments Manual complete separate than rest of tx    Edema Management Decongestive techniques with LE elevated for edema control                       PT Short Term Goals - 03/30/21 1529       PT SHORT TERM GOAL #1   Title Patient will be independent with initial HEP and self-management strategies to improve functional outcomes    Time 3    Period Weeks    Status New    Target Date 04/20/21      PT SHORT TERM GOAL #2   Title Patient will report at least 40% overall improvement in subjective complaint to indicate improvement in ability to perform ADLs.    Time 3    Period Weeks    Status New    Target Date 04/20/21      PT SHORT TERM GOAL #3   Title Patient will have LT knee AROM 5-100 degrees to improve functional mobility and facilitate squatting to pick up items from floor.    Time 3    Period Weeks    Status New    Target Date 04/20/21               PT Long Term Goals - 03/30/21 1530       PT LONG TERM GOAL #1   Title Patient will have LT knee AROM 0-120 degrees to improve functional mobility and facilitate squatting to pick up items from floor.    Time 6    Period Weeks    Status New    Target Date 05/11/21      PT LONG TERM GOAL #2   Title Patient will report at least 75% overall improvement in subjective complaint to indicate improvement in  ability to perform ADLs.    Time 6    Period Weeks    Status New    Target Date 05/11/21      PT LONG TERM GOAL #3   Title Patient will improve FOTO score to predicted value to indicate improvement in functional outcomes    Time 6    Period Weeks    Status New    Target Date 05/11/21      PT LONG TERM GOAL #4   Title Patient will have Lt knee flexion/ extension MMT equal to or > 4+/5 for improved ability to perform functional mobility, stair ambulation and ADLs.    Time 6    Period Weeks    Status New    Target Date 05/11/21      PT LONG TERM GOAL #5   Title Patient will be able to ambulate at least 375 feet during 2MWT with LRAD to demonstrate improved ability to perform functional mobility and associated tasks.    Time 6    Period Weeks    Status New    Target Date 05/11/21                   Plan - 04/13/21 1823     Clinical Impression Statement Gait training to improve arm swing to normalize mechanics.  Pt with slow  mechanics, encouraged to use SPC until able to ambulate with no limp and increased cadence.  Session focus wiht knee mobility and manual techniques for edema control.  Added standing stretches and TKE for knee mobility.  Began bike, initial rocking then full revolution seat 10.  Pt progressing well towards POC with AROM at 6-101 degrees. Reports Rt knee more painful then Lt, monitored thorugh session. Pt did c/o 1 episode of vertigo with sitting to supine position, additional pillow given to assist.    Examination-Activity Limitations Bend;Squat;Stairs;Stand;Carry;Transfers;Dressing;Hygiene/Grooming;Lift;Locomotion Level;Toileting;Sleep;Bed Mobility;Bathing;Sit    Examination-Participation Restrictions Community Activity;Shop;Cleaning;Yard Work;Meal Prep;Laundry    Stability/Clinical Decision Making Stable/Uncomplicated    Clinical Decision Making Low    Rehab Potential Good    PT Frequency 3x / week    PT Duration 6 weeks    PT Treatment/Interventions  ADLs/Self Care Home Management;Biofeedback;Cryotherapy;Electrical Stimulation;Functional mobility training;Therapeutic activities;Stair training;Orthotic Fit/Training;Energy conservation;Splinting;Vasopneumatic Device;Manual techniques;Manual lymph drainage;Vestibular;Therapeutic exercise;Iontophoresis 4mg /ml Dexamethasone;Moist Heat;Traction;Balance training;Taping;Patient/family education;DME Instruction;Gait training;Contrast Bath;Neuromuscular re-education;Compression bandaging;Fluidtherapy;Parrafin;Ultrasound;Scar mobilization;Visual/perceptual remediation/compensation;Passive range of motion;Spinal Manipulations;Dry needling;Joint Manipulations    PT Next Visit Plan Progress knee strength and mobility exercises as tolerated. Manual for pain, swelling and mobility as needed    PT Home Exercise Plan Eval: qaud set, heel prop, glute set, heel slide, ankle pump 11/28 SLR 11/30 heel raise, seated hamstring stretch    Consulted and Agree with Plan of Care Patient             Patient will benefit from skilled therapeutic intervention in order to improve the following deficits and impairments:  Abnormal gait, Decreased endurance, Hypomobility, Increased edema, Decreased activity tolerance, Decreased strength, Increased fascial restricitons, Pain, Decreased balance, Decreased mobility, Difficulty walking, Impaired flexibility, Improper body mechanics, Decreased range of motion  Visit Diagnosis: Left knee pain, unspecified chronicity  Stiffness of left knee, not elsewhere classified  Other abnormalities of gait and mobility     Problem List Patient Active Problem List   Diagnosis Date Noted   H/O total knee replacement, left 03/27/2021   Morbid obesity with BMI of 45.0-49.9, adult ([place]) 01/08/2020   Uncontrolled type 2 diabetes mellitus with hyperglycemia ([place]) 05/21/2019   Essential hypertension, benign 05/21/2019   Mixed hyperlipidemia 05/21/2019   Plantar fasciitis of right foot  06/14/2018   Osteoarthritis of left knee 04/05/2018   Osteoarthritis of right knee 04/05/2018   Hx of adenomatous colonic polyps 06/04/2016   LLQ pain 12/31/2014   Abdominal pain, RLQ (right lower quadrant) 04/24/2014   OSA on CPAP 10/18/2013   Alcohol abuse 01/01/2013   Migraine without aura, with intractable migraine, so stated, without mention of status migrainosus 09/21/2012   Memory loss 09/21/2012   Lapband APL Nov 2009 06/21/2012   Outbursts of anger 03/31/2012   FH: colon cancer 11/03/2011   Rectal bleed 11/03/2011   Esophageal dysphagia 11/03/2011   COLONIC POLYPS 10/10/2008   DIABETES MELLITUS, TYPE II 09/02/2008   Obesity 09/02/2008   ANXIETY NEUROSIS 09/02/2008   Severe mood disorder without psychotic features ([place]) 09/02/2008   GERD 09/02/2008   Constipation 09/02/2008   OSTEOARTHRITIS 09/02/2008   Ihor Austin, LPTA/CLT; CBIS [phone]  Aldona Lento, PTA 04/13/2021, 6:28 PM  [place], [place], [address] Phone: [phone]   Fax:  [phone]  Name: [name] MRN: [id number] Date of Birth: November 26, 1960

## 2021-04-15 ENCOUNTER — Other Ambulatory Visit: Payer: Self-pay

## 2021-04-15 ENCOUNTER — Encounter (HOSPITAL_COMMUNITY): Payer: Self-pay | Admitting: Physical Therapy

## 2021-04-15 ENCOUNTER — Ambulatory Visit (HOSPITAL_COMMUNITY): Payer: Medicare Other | Admitting: Physical Therapy

## 2021-04-15 DIAGNOSIS — M25562 Pain in left knee: Secondary | ICD-10-CM

## 2021-04-15 DIAGNOSIS — R2689 Other abnormalities of gait and mobility: Secondary | ICD-10-CM

## 2021-04-15 DIAGNOSIS — M25662 Stiffness of left knee, not elsewhere classified: Secondary | ICD-10-CM | POA: Diagnosis not present

## 2021-04-15 NOTE — Therapy (Signed)
[place], [place], [address] Phone: [phone]   Fax:  [phone]  Physical Therapy Treatment  Patient Details  Name: [name] MRN: [id number] Date of Birth: 1960/06/15 Referring Provider (PT): Netta Cedars MD   Encounter Date: 04/15/2021   PT End of Session - 04/15/21 1305     Visit Number 6    Number of Visits 18    Date for PT Re-Evaluation 05/11/21    Authorization Type Medicare/ Tricare 2ndary    Progress Note Due on Visit 10    PT Start Time 1315    PT Stop Time 1355    PT Time Calculation (min) 40 min    Activity Tolerance Patient tolerated treatment well    Behavior During Therapy [facility] for tasks assessed/performed             Past Medical History:  Diagnosis Date   Adenomatous polyp 2009   Anxiety    Arthritis    Bipolar 1 disorder ([place])    Constipation    Depression    Diabetes mellitus    Diabetes mellitus, type II ([place])    Diverticula of colon 2009   Generalized headaches    GERD (gastroesophageal reflux disease)    History of hiatal hernia    small   Hypertension    Migraine    Neuropathy    both feet   Obesity    Pelvic floor dysfunction    abnormal anorectal manometry at [facility] in 2009   Plantar fasciitis both feet   Sleep apnea    cpap setting of 3.5    Past Surgical History:  Procedure Laterality Date   ABDOMINAL HYSTERECTOMY     compete   CATARACT EXTRACTION W/PHACO Right 02/15/2017   Procedure: CATARACT EXTRACTION PHACO AND INTRAOCULAR LENS PLACEMENT ([place]);  Surgeon: Rutherford Guys, MD;  [facility]: [facility];  Service: Ophthalmology;  Laterality: Right;  CDE: 4.18   CATARACT EXTRACTION W/PHACO Left 03/01/2017   Procedure: CATARACT EXTRACTION PHACO AND INTRAOCULAR LENS PLACEMENT ([facility]);  Surgeon: Rutherford Guys, MD;  Location: [facility];  Service: Ophthalmology;  Laterality: Left;  CDE: 2.56   COLON RESECTION    03/30/2002   with end-colostomy and Hartmann's pouch    COLON SURGERY     complicated diverticulitis requiring sigmoid resection with colostomy and subsequent takedown   COLONOSCOPY  12/11/2007   Dr. Gala Romney- marginal prep, normal rectum pancolonic diverticula, adenomatous polyp   COLONOSCOPY  08/28/2003      Wide open colonic anastomosis/Scattered diverticula noted throughout colon/ Small external hemorrhoids   COLONOSCOPY  04/2012   [facility]: hyperplastic polyps, diverticulosis, ileocolonic anastomosis.   COLONOSCOPY WITH PROPOFOL N/A 06/07/2016   Procedure: COLONOSCOPY WITH PROPOFOL;  Surgeon: Daneil Dolin, MD;  [facility];  Service: Endoscopy;  Laterality: N/A;  7:30 am   COLOSTOMY CLOSURE  07/10/2002   ESOPHAGOGASTRODUODENOSCOPY N/A 01/08/2020   Procedure: UPPER GASTROINTESTINAL ENDOSCOPY;  Surgeon: Alphonsa Overall, MD;  [facility];  Service: General;  Laterality: N/A;   ESOPHAGOGASTRODUODENOSCOPY (EGD) WITH PROPOFOL N/A 08/23/2016   Procedure: ESOPHAGOGASTRODUODENOSCOPY (EGD) WITH PROPOFOL;  Surgeon: Alphonsa Overall, MD;  Location[facility];  Service: General;  Laterality: N/A;   FLEXIBLE SIGMOIDOSCOPY  01/20/2012   RMR: incomplete/attempted colonoscopy. Inadequate prep precluded examination   HEEL SPUR SURGERY Left 09/11/2013   both have been done   KNEE ARTHROSCOPY  02/04/2004    left knee/partial medial meniscectomy.   KNEE SURGERY     3  arthroscopic  2 on left 1 on right   LAPAROSCOPIC GASTRIC BANDING  2009   LAPAROSCOPIC GASTRIC SLEEVE RESECTION N/A 01/08/2020   Procedure: LAPAROSCOPIC SLEEVE GASTRECTOMY;  Surgeon: Alphonsa Overall, MD;  [facility];  Service: General;  Laterality: N/A;   LAPAROSCOPIC SALPINGOOPHERECTOMY  03/30/2002   TOTAL KNEE ARTHROPLASTY Left 03/27/2021   Procedure: TOTAL KNEE ARTHROPLASTY;  Surgeon: Netta Cedars, MD;  [facility];  Service: Orthopedics;  Laterality: Left;   TUBAL LIGATION      There were no vitals filed for this visit.   Subjective Assessment - 04/15/21 1318     Subjective  reports 7/10 pain with bending, Patient reports she is 70% better but still feels she doesn't feel secure walking without her walking stick    Currently in Pain? Yes    Pain Score 7     Pain Location Knee    Pain Orientation Left    Pain Descriptors / Indicators Tightness;Aching    Pain Type Surgical pain                [facility] PT Assessment - 04/15/21 0001       Assessment   Medical Diagnosis LT TKA    Referring Provider (PT) Netta Cedars MD    Onset Date/Surgical Date 03/27/21    Next MD Visit 06/03/20      Observation/Other Assessments   Focus on Therapeutic Outcomes (FOTO)  22% function, was 21% function                           [facility] Adult PT Treatment/Exercise - 04/15/21 0001       Knee/Hip Exercises: Stretches   Gastroc Stretch Left;3 reps;30 seconds    Gastroc Stretch Limitations slant board      Knee/Hip Exercises: Aerobic   Stationary Bike 5' seat 10  full revolution      Knee/Hip Exercises: Supine   Bridges 4 sets;5 reps    Straight Leg Raises Left;10 reps;2 sets    Straight Leg Raises Limitations 5" holds    Knee Extension AROM    Knee Extension Limitations 6    Knee Flexion AROM    Knee Flexion Limitations 110    Other Supine Knee/Hip Exercises heel slides left 6 minutes total                     PT Education - 04/15/21 1352     Education Details on compression stockings, size and importance of ROM/strength in regards to knee rehab    Person(s) Educated Patient    Methods Explanation    Comprehension Verbalized understanding              PT Short Term Goals - 04/15/21 1324       PT SHORT TERM GOAL #1   Title Patient will be independent with initial HEP and self-management strategies to improve functional outcomes    Time 3    Period Weeks    Status Achieved    Target Date 04/20/21      PT SHORT TERM GOAL #2   Title Patient will report at least 40% overall improvement in subjective complaint to indicate  improvement in ability to perform ADLs.    Time 3    Period Weeks    Status Achieved    Target Date 04/20/21      PT SHORT TERM GOAL #3   Title Patient will have LT knee AROM 5-100 degrees  to improve functional mobility and facilitate squatting to pick up items from floor.    Time 3    Period Weeks    Status New    Target Date 04/20/21               PT Long Term Goals - 04/15/21 1325       PT LONG TERM GOAL #1   Title Patient will have LT knee AROM 0-120 degrees to improve functional mobility and facilitate squatting to pick up items from floor.    Time 6    Period Weeks    Status New    Target Date 05/11/21      PT LONG TERM GOAL #2   Title Patient will report at least 75% overall improvement in subjective complaint to indicate improvement in ability to perform ADLs.    Baseline 70%    Time 6    Period Weeks    Status On-going    Target Date 05/11/21      PT LONG TERM GOAL #3   Title Patient will improve FOTO score to predicted value to indicate improvement in functional outcomes    Time 6    Period Weeks    Status On-going    Target Date 05/11/21      PT LONG TERM GOAL #4   Title Patient will have Lt knee flexion/ extension MMT equal to or > 4+/5 for improved ability to perform functional mobility, stair ambulation and ADLs.    Time 6    Period Weeks    Status New    Target Date 05/11/21      PT LONG TERM GOAL #5   Title Patient will be able to ambulate at least 375 feet during 2MWT with LRAD to demonstrate improved ability to perform functional mobility and associated tasks.    Time 6    Period Weeks    Status New    Target Date 05/11/21                   Plan - 04/15/21 1358     Clinical Impression Statement Educated patient on how elevation, compression and strengthening/mobility are the only things that help with swelling long term. Remeasured patient for compression garments as patient reported she never received her measurements and  educated patient on outlet in [place] and where to find them online at [employer]. Continued strength and ROM limitations noted, will continue with strengthening as tolerated. Will continue with current POC.    Examination-Activity Limitations Bend;Squat;Stairs;Stand;Carry;Transfers;Dressing;Hygiene/Grooming;Lift;Locomotion Level;Toileting;Sleep;Bed Mobility;Bathing;Sit    Examination-Participation Restrictions Community Activity;Shop;Cleaning;Yard Work;Meal Prep;Laundry    Stability/Clinical Decision Making Stable/Uncomplicated    Rehab Potential Good    PT Frequency 3x / week    PT Duration 6 weeks    PT Treatment/Interventions ADLs/Self Care Home Management;Biofeedback;Cryotherapy;Electrical Stimulation;Functional mobility training;Therapeutic activities;Stair training;Orthotic Fit/Training;Energy conservation;Splinting;Vasopneumatic Device;Manual techniques;Manual lymph drainage;Vestibular;Therapeutic exercise;Iontophoresis 4mg /ml Dexamethasone;Moist Heat;Traction;Balance training;Taping;Patient/family education;DME Instruction;Gait training;Contrast Bath;Neuromuscular re-education;Compression bandaging;Fluidtherapy;Parrafin;Ultrasound;Scar mobilization;Visual/perceptual remediation/compensation;Passive range of motion;Spinal Manipulations;Dry needling;Joint Manipulations    PT Next Visit Plan Progress knee strength and mobility exercises as tolerated. progress strengthening and functional mobiltiy    PT Home Exercise Plan Eval: qaud set, heel prop, glute set, heel slide, ankle pump 11/28 SLR 11/30 heel raise, seated hamstring stretch    Consulted and Agree with Plan of Care Patient             Patient will benefit from skilled therapeutic intervention in order to improve the following deficits and impairments:  Abnormal gait, Decreased  endurance, Hypomobility, Increased edema, Decreased activity tolerance, Decreased strength, Increased fascial restricitons, Pain, Decreased balance, Decreased  mobility, Difficulty walking, Impaired flexibility, Improper body mechanics, Decreased range of motion  Visit Diagnosis: Left knee pain, unspecified chronicity  Stiffness of left knee, not elsewhere classified  Other abnormalities of gait and mobility     Problem List Patient Active Problem List   Diagnosis Date Noted   H/O total knee replacement, left 03/27/2021   Morbid obesity with BMI of 45.0-49.9, adult ([place]) 01/08/2020   Uncontrolled type 2 diabetes mellitus with hyperglycemia ([place]) 05/21/2019   Essential hypertension, benign 05/21/2019   Mixed hyperlipidemia 05/21/2019   Plantar fasciitis of right foot 06/14/2018   Osteoarthritis of left knee 04/05/2018   Osteoarthritis of right knee 04/05/2018   Hx of adenomatous colonic polyps 06/04/2016   LLQ pain 12/31/2014   Abdominal pain, RLQ (right lower quadrant) 04/24/2014   OSA on CPAP 10/18/2013   Alcohol abuse 01/01/2013   Migraine without aura, with intractable migraine, so stated, without mention of status migrainosus 09/21/2012   Memory loss 09/21/2012   Lapband APL Nov 2009 06/21/2012   Outbursts of anger 03/31/2012   FH: colon cancer 11/03/2011   Rectal bleed 11/03/2011   Esophageal dysphagia 11/03/2011   COLONIC POLYPS 10/10/2008   DIABETES MELLITUS, TYPE II 09/02/2008   Obesity 09/02/2008   ANXIETY NEUROSIS 09/02/2008   Severe mood disorder without psychotic features ([place]) 09/02/2008   GERD 09/02/2008   Constipation 09/02/2008   OSTEOARTHRITIS 09/02/2008   2:00 PM, 04/15/21 [name] Pitch, DPT [facility]  [phone] office   [place] [address] [place], [place], [address] Phone: [phone]   Fax:  [phone]  Name: ARIENNE GARTIN MRN: [id number] Date of Birth: [birth year]

## 2021-04-16 ENCOUNTER — Ambulatory Visit (HOSPITAL_COMMUNITY): Payer: Medicare Other

## 2021-04-17 ENCOUNTER — Encounter (HOSPITAL_COMMUNITY): Payer: TRICARE For Life (TFL)

## 2021-04-20 ENCOUNTER — Ambulatory Visit (HOSPITAL_COMMUNITY): Payer: Medicare Other | Admitting: Physical Therapy

## 2021-04-20 ENCOUNTER — Encounter (HOSPITAL_COMMUNITY): Payer: Self-pay | Admitting: Physical Therapy

## 2021-04-20 ENCOUNTER — Other Ambulatory Visit: Payer: Self-pay

## 2021-04-20 DIAGNOSIS — M25662 Stiffness of left knee, not elsewhere classified: Secondary | ICD-10-CM

## 2021-04-20 DIAGNOSIS — R2689 Other abnormalities of gait and mobility: Secondary | ICD-10-CM | POA: Diagnosis not present

## 2021-04-20 DIAGNOSIS — M25562 Pain in left knee: Secondary | ICD-10-CM | POA: Diagnosis not present

## 2021-04-20 NOTE — Therapy (Signed)
[place], [place], [address] Phone: [phone]   Fax:  [phone]  Physical Therapy Treatment  Patient Details  Name: [name] MRN: [id number] Date of Birth: 1961/04/29 Referring Provider (PT): Netta Cedars MD   Encounter Date: 04/20/2021   PT End of Session - 04/20/21 1451     Visit Number 7    Number of Visits 18    Date for PT Re-Evaluation 05/11/21    Authorization Type Medicare/ Tricare 2ndary    Progress Note Due on Visit 10    PT Start Time 1447    PT Stop Time 1525    PT Time Calculation (min) 38 min    Activity Tolerance Patient tolerated treatment well    Behavior During Therapy [facility] for tasks assessed/performed             Past Medical History:  Diagnosis Date   Adenomatous polyp 2009   Anxiety    Arthritis    Bipolar 1 disorder ([place])    Constipation    Depression    Diabetes mellitus    Diabetes mellitus, type II ([place])    Diverticula of colon 2009   Generalized headaches    GERD (gastroesophageal reflux disease)    History of hiatal hernia    small   Hypertension    Migraine    Neuropathy    both feet   Obesity    Pelvic floor dysfunction    abnormal anorectal manometry at [facility] in 2009   Plantar fasciitis both feet   Sleep apnea    cpap setting of 3.5    Past Surgical History:  Procedure Laterality Date   ABDOMINAL HYSTERECTOMY     compete   CATARACT EXTRACTION W/PHACO Right 02/15/2017   Procedure: CATARACT EXTRACTION PHACO AND INTRAOCULAR LENS PLACEMENT ([place]);  Surgeon: Rutherford Guys, MD;  Location: [facility];  Service: Ophthalmology;  Laterality: Right;  CDE: 4.18   CATARACT EXTRACTION W/PHACO Left 03/01/2017   Procedure: CATARACT EXTRACTION PHACO AND INTRAOCULAR LENS PLACEMENT ([facility]);  Surgeon: Rutherford Guys, MD;  Location: [facility];  Service: Ophthalmology;  Laterality: Left;  CDE: 2.56   COLON RESECTION    03/30/2002   with end-colostomy and Hartmann's pouch    COLON SURGERY     complicated diverticulitis requiring sigmoid resection with colostomy and subsequent takedown   COLONOSCOPY  12/11/2007   Dr. Gala Romney- marginal prep, normal rectum pancolonic diverticula, adenomatous polyp   COLONOSCOPY  08/28/2003      Wide open colonic anastomosis/Scattered diverticula noted throughout colon/ Small external hemorrhoids   COLONOSCOPY  04/2012   [facility]: hyperplastic polyps, diverticulosis, ileocolonic anastomosis.   COLONOSCOPY WITH PROPOFOL N/A 06/07/2016   Procedure: COLONOSCOPY WITH PROPOFOL;  Surgeon: Daneil Dolin, MD;  [facility];  Service: Endoscopy;  Laterality: N/A;  7:30 am   COLOSTOMY CLOSURE  07/10/2002   ESOPHAGOGASTRODUODENOSCOPY N/A 01/08/2020   Procedure: UPPER GASTROINTESTINAL ENDOSCOPY;  Surgeon: Alphonsa Overall, MD;  [facility];  Service: General;  Laterality: N/A;   ESOPHAGOGASTRODUODENOSCOPY (EGD) WITH PROPOFOL N/A 08/23/2016   Procedure: ESOPHAGOGASTRODUODENOSCOPY (EGD) WITH PROPOFOL;  Surgeon: Alphonsa Overall, MD;  [facility];  Service: General;  Laterality: N/A;   FLEXIBLE SIGMOIDOSCOPY  01/20/2012   RMR: incomplete/attempted colonoscopy. Inadequate prep precluded examination   HEEL SPUR SURGERY Left 09/11/2013   both have been done   KNEE ARTHROSCOPY  02/04/2004    left knee/partial medial meniscectomy.   KNEE SURGERY     3  arthroscopic  2 on left 1 on right   LAPAROSCOPIC GASTRIC BANDING  2009   LAPAROSCOPIC GASTRIC SLEEVE RESECTION N/A 01/08/2020   Procedure: LAPAROSCOPIC SLEEVE GASTRECTOMY;  Surgeon: Alphonsa Overall, MD;  [facility];  Service: General;  Laterality: N/A;   LAPAROSCOPIC SALPINGOOPHERECTOMY  03/30/2002   TOTAL KNEE ARTHROPLASTY Left 03/27/2021   Procedure: TOTAL KNEE ARTHROPLASTY;  Surgeon: Netta Cedars, MD;  [facility];  Service: Orthopedics;  Laterality: Left;   TUBAL LIGATION      There were no vitals filed for this visit.   Subjective Assessment - 04/20/21 1455     Subjective  States that she had a cramp in her other leg and thought is was a blood clot but her MD walked her through a test and he said she didn't come back with positive for DVT. Current pain in left knee is 5/10 described as dull achy and right knee is 8/10 described as sharp and throbbing right In the knee. Reports she got her compression garments but haven't worn them yet    Pain Score 8     Pain Location Knee    Pain Orientation Right    Pain Descriptors / Indicators Throbbing;Hervey Ard                [facility] PT Assessment - 04/20/21 0001       Assessment   Medical Diagnosis LT TKA    Referring Provider (PT) Netta Cedars MD    Onset Date/Surgical Date 03/27/21                           [facility] Adult PT Treatment/Exercise - 04/20/21 0001       Knee/Hip Exercises: Stretches   Active Hamstring Stretch Left;3 reps;30 seconds   seated     Knee/Hip Exercises: Aerobic   Stationary Bike 5' seat 10  full revolution      Knee/Hip Exercises: Standing   Hip Flexion Right;3 sets;10 reps    Hip Abduction AROM;Right;3 sets;10 reps    Hip Extension Right;3 sets;10 reps;Knee straight      Knee/Hip Exercises: Seated   Long Arc Quad AAROM;Left;5 reps    Long Arc [employer] --   10 second holds   Other Seated Knee/Hip Exercises knee flexion left - x10 10" hlds    Sit to Sand 3 sets;10 reps;without UE support   with yellow weighted ball     Knee/Hip Exercises: Supine   Knee Extension --   measured seated   Knee Extension Limitations 4    Knee Flexion --   measured seated   Knee Flexion Limitations 105                       PT Short Term Goals - 04/15/21 1324       PT SHORT TERM GOAL #1   Title Patient will be independent with initial HEP and self-management strategies to improve functional outcomes    Time 3    Period Weeks    Status Achieved    Target Date 04/20/21      PT SHORT TERM GOAL #2   Title Patient will report at least 40% overall improvement in  subjective complaint to indicate improvement in ability to perform ADLs.    Time 3    Period Weeks    Status Achieved    Target Date 04/20/21      PT SHORT TERM GOAL #3   Title  Patient will have LT knee AROM 5-100 degrees to improve functional mobility and facilitate squatting to pick up items from floor.    Time 3    Period Weeks    Status New    Target Date 04/20/21               PT Long Term Goals - 04/15/21 1325       PT LONG TERM GOAL #1   Title Patient will have LT knee AROM 0-120 degrees to improve functional mobility and facilitate squatting to pick up items from floor.    Time 6    Period Weeks    Status New    Target Date 05/11/21      PT LONG TERM GOAL #2   Title Patient will report at least 75% overall improvement in subjective complaint to indicate improvement in ability to perform ADLs.    Baseline 70%    Time 6    Period Weeks    Status On-going    Target Date 05/11/21      PT LONG TERM GOAL #3   Title Patient will improve FOTO score to predicted value to indicate improvement in functional outcomes    Time 6    Period Weeks    Status On-going    Target Date 05/11/21      PT LONG TERM GOAL #4   Title Patient will have Lt knee flexion/ extension MMT equal to or > 4+/5 for improved ability to perform functional mobility, stair ambulation and ADLs.    Time 6    Period Weeks    Status New    Target Date 05/11/21      PT LONG TERM GOAL #5   Title Patient will be able to ambulate at least 375 feet during 2MWT with LRAD to demonstrate improved ability to perform functional mobility and associated tasks.    Time 6    Period Weeks    Status New    Target Date 05/11/21                   Plan - 04/20/21 1521     Clinical Impression Statement Patient had to stop with sit to stands as she became hot and dizzy. Rested and symptoms resolved. Focused on seated and standing exercises as patient reports dizziness with laying down. Overall patient  reports her surgical knee is doing well and it is her dizziness and contralateral leg that is her biggest limitation at this time. Will continue with current POC as tolerated.    Examination-Activity Limitations Bend;Squat;Stairs;Stand;Carry;Transfers;Dressing;Hygiene/Grooming;Lift;Locomotion Level;Toileting;Sleep;Bed Mobility;Bathing;Sit    Examination-Participation Restrictions Community Activity;Shop;Cleaning;Yard Work;Meal Prep;Laundry    Stability/Clinical Decision Making Stable/Uncomplicated    Rehab Potential Good    PT Frequency 3x / week    PT Duration 6 weeks    PT Treatment/Interventions ADLs/Self Care Home Management;Biofeedback;Cryotherapy;Electrical Stimulation;Functional mobility training;Therapeutic activities;Stair training;Orthotic Fit/Training;Energy conservation;Splinting;Vasopneumatic Device;Manual techniques;Manual lymph drainage;Vestibular;Therapeutic exercise;Iontophoresis 4mg /ml Dexamethasone;Moist Heat;Traction;Balance training;Taping;Patient/family education;DME Instruction;Gait training;Contrast Bath;Neuromuscular re-education;Compression bandaging;Fluidtherapy;Parrafin;Ultrasound;Scar mobilization;Visual/perceptual remediation/compensation;Passive range of motion;Spinal Manipulations;Dry needling;Joint Manipulations    PT Next Visit Plan Progress knee strength and mobility exercises as tolerated. progress strengthening and functional mobiltiy    PT Home Exercise Plan Eval: qaud set, heel prop, glute set, heel slide, ankle pump 11/28 SLR 11/30 heel raise, seated hamstring stretch    Consulted and Agree with Plan of Care Patient             Patient will benefit from skilled therapeutic intervention in order to improve the  following deficits and impairments:  Abnormal gait, Decreased endurance, Hypomobility, Increased edema, Decreased activity tolerance, Decreased strength, Increased fascial restricitons, Pain, Decreased balance, Decreased mobility, Difficulty walking,  Impaired flexibility, Improper body mechanics, Decreased range of motion  Visit Diagnosis: Left knee pain, unspecified chronicity  Stiffness of left knee, not elsewhere classified  Other abnormalities of gait and mobility     Problem List Patient Active Problem List   Diagnosis Date Noted   H/O total knee replacement, left 03/27/2021   Morbid obesity with BMI of 45.0-49.9, adult ([place]) 01/08/2020   Uncontrolled type 2 diabetes mellitus with hyperglycemia ([place]) 05/21/2019   Essential hypertension, benign 05/21/2019   Mixed hyperlipidemia 05/21/2019   Plantar fasciitis of right foot 06/14/2018   Osteoarthritis of left knee 04/05/2018   Osteoarthritis of right knee 04/05/2018   Hx of adenomatous colonic polyps 06/04/2016   LLQ pain 12/31/2014   Abdominal pain, RLQ (right lower quadrant) 04/24/2014   OSA on CPAP 10/18/2013   Alcohol abuse 01/01/2013   Migraine without aura, with intractable migraine, so stated, without mention of status migrainosus 09/21/2012   Memory loss 09/21/2012   Lapband APL Nov 2009 06/21/2012   Outbursts of anger 03/31/2012   FH: colon cancer 11/03/2011   Rectal bleed 11/03/2011   Esophageal dysphagia 11/03/2011   COLONIC POLYPS 10/10/2008   DIABETES MELLITUS, TYPE II 09/02/2008   Obesity 09/02/2008   ANXIETY NEUROSIS 09/02/2008   Severe mood disorder without psychotic features ([place]) 09/02/2008   GERD 09/02/2008   Constipation 09/02/2008   OSTEOARTHRITIS 09/02/2008   3:41 PM, 04/20/21 [name] Pitch, DPT Physical Therapy with [facility]  [phone] office   [place] [address] [place], [place], [address] Phone: [phone]   Fax:  [phone]  Name: [name] MRN: [id number] Date of Birth: 04/29/1961

## 2021-04-21 DIAGNOSIS — D518 Other vitamin B12 deficiency anemias: Secondary | ICD-10-CM | POA: Diagnosis not present

## 2021-04-21 DIAGNOSIS — G43909 Migraine, unspecified, not intractable, without status migrainosus: Secondary | ICD-10-CM | POA: Diagnosis not present

## 2021-04-21 DIAGNOSIS — E1165 Type 2 diabetes mellitus with hyperglycemia: Secondary | ICD-10-CM | POA: Diagnosis not present

## 2021-04-21 DIAGNOSIS — M159 Polyosteoarthritis, unspecified: Secondary | ICD-10-CM | POA: Diagnosis not present

## 2021-04-21 DIAGNOSIS — K219 Gastro-esophageal reflux disease without esophagitis: Secondary | ICD-10-CM | POA: Diagnosis not present

## 2021-04-21 DIAGNOSIS — E782 Mixed hyperlipidemia: Secondary | ICD-10-CM | POA: Diagnosis not present

## 2021-04-21 DIAGNOSIS — I10 Essential (primary) hypertension: Secondary | ICD-10-CM | POA: Diagnosis not present

## 2021-04-22 ENCOUNTER — Ambulatory Visit (HOSPITAL_COMMUNITY): Payer: Medicare Other

## 2021-04-22 ENCOUNTER — Other Ambulatory Visit: Payer: Self-pay

## 2021-04-22 ENCOUNTER — Encounter (HOSPITAL_COMMUNITY): Payer: Self-pay

## 2021-04-22 DIAGNOSIS — M25562 Pain in left knee: Secondary | ICD-10-CM | POA: Diagnosis not present

## 2021-04-22 DIAGNOSIS — R2689 Other abnormalities of gait and mobility: Secondary | ICD-10-CM

## 2021-04-22 DIAGNOSIS — M25662 Stiffness of left knee, not elsewhere classified: Secondary | ICD-10-CM

## 2021-04-22 NOTE — Therapy (Signed)
[place], [place], [address] Phone: [phone]   Fax:  [phone]  Physical Therapy Treatment  Patient Details  Name: [name] MRN: [id number] Date of Birth: March 29, 1961 Referring Provider (PT): Netta Cedars MD   Encounter Date: 04/22/2021   PT End of Session - 04/22/21 1533     Visit Number 8    Number of Visits 18    Date for PT Re-Evaluation 05/11/21    Authorization Type Medicare/ Tric[employer] 2ndary    Progress Note Due on Visit 10    PT Start Time 1530    PT Stop Time 1615    PT Time Calculation (min) 45 min    Activity Tolerance Patient tolerated treatment well    Behavior During Therapy [facility] for tasks assessed/performed             Past Medical History:  Diagnosis Date   Adenomatous polyp 2009   Anxiety    Arthritis    Bipolar 1 disorder ([place])    Constipation    Depression    Diabetes mellitus    Diabetes mellitus, type II ([place])    Diverticula of colon 2009   Generalized headaches    GERD (gastroesophageal reflux disease)    History of hiatal hernia    small   Hypertension    Migraine    Neuropathy    both feet   Obesity    Pelvic floor dysfunction    abnormal anorectal manometry at [facility] in 2009   Plantar fasciitis both feet   Sleep apnea    cpap setting of 3.5    Past Surgical History:  Procedure Laterality Date   ABDOMINAL HYSTERECTOMY     compete   CATARACT EXTRACTION W/PHACO Right 02/15/2017   Procedure: CATARACT EXTRACTION PHACO AND INTRAOCULAR LENS PLACEMENT ([place]);  Surgeon: Rutherford Guys, MD;  Location: [facility];  Service: Ophthalmology;  Laterality: Right;  CDE: 4.18   CATARACT EXTRACTION W/PHACO Left 03/01/2017   Procedure: CATARACT EXTRACTION PHACO AND INTRAOCULAR LENS PLACEMENT (I[facility]);  Surgeon: Rutherford Guys, MD;  Location: [facility];  Service: Ophthalmology;  Laterality: Left;  CDE: 2.56   COLON RESECTION    03/30/2002   with end-colostomy and Hartmann's pouch    COLON SURGERY     complicated diverticulitis requiring sigmoid resection with colostomy and subsequent takedown   COLONOSCOPY  12/11/2007   Dr. Gala Romney- marginal prep, normal rectum pancolonic diverticula, adenomatous polyp   COLONOSCOPY  08/28/2003      Wide open colonic anastomosis/Scattered diverticula noted throughout colon/ Small external hemorrhoids   COLONOSCOPY  04/2012   [facility]: hyperplastic polyps, diverticulosis, ileocolonic anastomosis.   COLONOSCOPY WITH PROPOFOL N/A 06/07/2016   Procedure: COLONOSCOPY WITH PROPOFOL;  Surgeon: Daneil Dolin, MD;  Location: [facility];  Service: Endoscopy;  Laterality: N/A;  7:30 am   COLOSTOMY CLOSURE  07/10/2002   ESOPHAGOGASTRODUODENOSCOPY N/A 01/08/2020   Procedure: UPPER GASTROINTESTINAL ENDOSCOPY;  Surgeon: Alphonsa Overall, MD;  Location: [facility];  Service: General;  Laterality: N/A;   ESOPHAGOGASTRODUODENOSCOPY (EGD) WITH PROPOFOL N/A 08/23/2016   Procedure: ESOPHAGOGASTRODUODENOSCOPY (EGD) WITH PROPOFOL;  Surgeon: Alphonsa Overall, MD;  Location: [facility];  Service: General;  Laterality: N/A;   FLEXIBLE SIGMOIDOSCOPY  01/20/2012   RMR: incomplete/attempted colonoscopy. Inadequate prep precluded examination   HEEL SPUR SURGERY Left 09/11/2013   both have been done   KNEE ARTHROSCOPY  02/04/2004    left knee/partial medial meniscectomy.   KNEE SURGERY     3  arthroscopic  2 on left 1 on right   LAPAROSCOPIC GASTRIC BANDING  2009   LAPAROSCOPIC GASTRIC SLEEVE RESECTION N/A 01/08/2020   Procedure: LAPAROSCOPIC SLEEVE GASTRECTOMY;  Surgeon: Alphonsa Overall, MD;  Location: [facility];  Service: General;  Laterality: N/A;   LAPAROSCOPIC SALPINGOOPHERECTOMY  03/30/2002   TOTAL KNEE ARTHROPLASTY Left 03/27/2021   Procedure: TOTAL KNEE ARTHROPLASTY;  Surgeon: Netta Cedars, MD;  Location: [facility];  Service: Orthopedics;  Laterality: Left;   TUBAL LIGATION      There were no vitals filed for this visit.   Subjective Assessment - 04/22/21 1530     Subjective  Pt arrived with brace on Rt knee, reports increased pain.  Lt knee pain scale 4/10.  Reports dizziness has reduced but does continue to have.    Pertinent History Lt TKA 03/27/21    Patient Stated Goals Be able to walk better, benefit knee, improve ROM    Currently in Pain? Yes    Pain Score 9     Pain Location Knee    Pain Orientation Left    Pain Descriptors / Indicators Sore;Sharp    Pain Type Surgical pain    Pain Onset 1 to 4 weeks ago    Pain Frequency Constant    Aggravating Factors  WB, standing, bending    Pain Relieving Factors meds, rest, ice    Effect of Pain on Daily Activities limits    Multiple Pain Sites Yes    Pain Score 4    Pain Location Knee    Pain Orientation Left    Pain Descriptors / Indicators Sore;Tightness;Aching    Pain Type Surgical pain    Aggravating Factors  WB, standing, bending    Pain Relieving Factors meds, rest, ice                [facility] PT Assessment - 04/22/21 0001       Assessment   Medical Diagnosis LT TKA    Referring Provider (PT) Netta Cedars MD    Onset Date/Surgical Date 03/27/21    Next MD Visit 05/14/20                           [facility] Adult PT Treatment/Exercise - 04/22/21 0001       Knee/Hip Exercises: Stretches   Knee: Self-Stretch to increase Flexion 5 reps;10 seconds    Knee: Self-Stretch Limitations knee drive on 16XW step      Knee/Hip Exercises: Aerobic   Stationary Bike 5' seat 10  full revolution      Knee/Hip Exercises: Standing   Terminal Knee Extension 10 reps;Theraband    Theraband Level (Terminal Knee Extension) Level 3 (Green)    Terminal Knee Extension Limitations 5" holds    SLS 5" holds    SLS with Vectors 5x 5"    Other Standing Knee Exercises tandem 30" x1' tandem on foam 2x 30"      Knee/Hip Exercises: Seated   Long Arc Quad 10 reps;3 sets    [employer] Weight 3 lbs.    Long [employer] Limitations 10" holds with DF, weight added 2nd and 3rd set    Heel Slides 10 reps     Sit to Sand 10 reps;without UE support      Knee/Hip Exercises: Supine   Quad Sets Left;10 reps    Heel Slides 10 reps;AROM    Bridges Limitations 12x 5" holds    Straight Leg Raises Left;10 reps  Straight Leg Raises Limitations 5" holds    Knee Extension AROM    Knee Extension Limitations 4    Knee Flexion AROM    Knee Flexion Limitations 110                     PT Education - 04/22/21 1622     Education Details Therapist examined incision per request, encouraged to take washcloth and gentle scubb to remove dry skin and scabs.    Person(s) Educated Patient    Methods Explanation    Comprehension Verbalized understanding              PT Short Term Goals - 04/15/21 1324       PT SHORT TERM GOAL #1   Title Patient will be independent with initial HEP and self-management strategies to improve functional outcomes    Time 3    Period Weeks    Status Achieved    Target Date 04/20/21      PT SHORT TERM GOAL #2   Title Patient will report at least 40% overall improvement in subjective complaint to indicate improvement in ability to perform ADLs.    Time 3    Period Weeks    Status Achieved    Target Date 04/20/21      PT SHORT TERM GOAL #3   Title Patient will have LT knee AROM 5-100 degrees to improve functional mobility and facilitate squatting to pick up items from floor.    Time 3    Period Weeks    Status New    Target Date 04/20/21               PT Long Term Goals - 04/15/21 1325       PT LONG TERM GOAL #1   Title Patient will have LT knee AROM 0-120 degrees to improve functional mobility and facilitate squatting to pick up items from floor.    Time 6    Period Weeks    Status New    Target Date 05/11/21      PT LONG TERM GOAL #2   Title Patient will report at least 75% overall improvement in subjective complaint to indicate improvement in ability to perform ADLs.    Baseline 70%    Time 6    Period Weeks    Status On-going     Target Date 05/11/21      PT LONG TERM GOAL #3   Title Patient will improve FOTO score to predicted value to indicate improvement in functional outcomes    Time 6    Period Weeks    Status On-going    Target Date 05/11/21      PT LONG TERM GOAL #4   Title Patient will have Lt knee flexion/ extension MMT equal to or > 4+/5 for improved ability to perform functional mobility, stair ambulation and ADLs.    Time 6    Period Weeks    Status New    Target Date 05/11/21      PT LONG TERM GOAL #5   Title Patient will be able to ambulate at least 375 feet during 2MWT with LRAD to demonstrate improved ability to perform functional mobility and associated tasks.    Time 6    Period Weeks    Status New    Target Date 05/11/21                   Plan - 04/22/21 1618  Clinical Impression Statement Pt tolerated well towards session.  Continued session focus iwht knee mobility and LE strengthening.  AROM 4-110 degrees.  Added 3# with LAQ with good control noted.  Pt challenged with balance associated activities, required HHA with SLS activiites.  Added vector for balance and hip stability and progressed tandem to dynamic surface.    Examination-Activity Limitations Bend;Squat;Stairs;Stand;Carry;Transfers;Dressing;Hygiene/Grooming;Lift;Locomotion Level;Toileting;Sleep;Bed Mobility;Bathing;Sit    Examination-Participation Restrictions Community Activity;Shop;Cleaning;Yard Work;Meal Prep;Laundry    Stability/Clinical Decision Making Stable/Uncomplicated    Clinical Decision Making Low    Rehab Potential Good    PT Frequency 3x / week    PT Duration 6 weeks    PT Treatment/Interventions ADLs/Self Care Home Management;Biofeedback;Cryotherapy;Electrical Stimulation;Functional mobility training;Therapeutic activities;Stair training;Orthotic Fit/Training;Energy conservation;Splinting;Vasopneumatic Device;Manual techniques;Manual lymph drainage;Vestibular;Therapeutic exercise;Iontophoresis  4mg /ml Dexamethasone;Moist Heat;Traction;Balance training;Taping;Patient/family education;DME Instruction;Gait training;Contrast Bath;Neuromuscular re-education;Compression bandaging;Fluidtherapy;Parrafin;Ultrasound;Scar mobilization;Visual/perceptual remediation/compensation;Passive range of motion;Spinal Manipulations;Dry needling;Joint Manipulations    PT Next Visit Plan Progress knee strength and mobility exercises as tolerated. progress strengthening and functional mobiltiy    PT Home Exercise Plan Eval: qaud set, heel prop, glute set, heel slide, ankle pump 11/28 SLR 11/30 heel raise, seated hamstring stretch    Consulted and Agree with Plan of Care Patient             Patient will benefit from skilled therapeutic intervention in order to improve the following deficits and impairments:  Abnormal gait, Decreased endurance, Hypomobility, Increased edema, Decreased activity tolerance, Decreased strength, Increased fascial restricitons, Pain, Decreased balance, Decreased mobility, Difficulty walking, Impaired flexibility, Improper body mechanics, Decreased range of motion  Visit Diagnosis: Left knee pain, unspecified chronicity  Stiffness of left knee, not elsewhere classified  Other abnormalities of gait and mobility     Problem List Patient Active Problem List   Diagnosis Date Noted   H/O total knee replacement, left 03/27/2021   Morbid obesity with BMI of 45.0-49.9, adult ([place]) 01/08/2020   Uncontrolled type 2 diabetes mellitus with hyperglycemia ([place]) 05/21/2019   Essential hypertension, benign 05/21/2019   Mixed hyperlipidemia 05/21/2019   Plantar fasciitis of right foot 06/14/2018   Osteoarthritis of left knee 04/05/2018   Osteoarthritis of right knee 04/05/2018   Hx of adenomatous colonic polyps 06/04/2016   LLQ pain 12/31/2014   Abdominal pain, RLQ (right lower quadrant) 04/24/2014   OSA on CPAP 10/18/2013   Alcohol abuse 01/01/2013   Migraine without aura, with  intractable migraine, so stated, without mention of status migrainosus 09/21/2012   Memory loss 09/21/2012   Lapband APL Nov 2009 06/21/2012   Outbursts of anger 03/31/2012   FH: colon cancer 11/03/2011   Rectal bleed 11/03/2011   Esophageal dysphagia 11/03/2011   COLONIC POLYPS 10/10/2008   DIABETES MELLITUS, TYPE II 09/02/2008   Obesity 09/02/2008   ANXIETY NEUROSIS 09/02/2008   Severe mood disorder without psychotic features ([place]) 09/02/2008   GERD 09/02/2008   Constipation 09/02/2008   OSTEOARTHRITIS 09/02/2008   Ihor Austin, LPTA/CLT; CBIS [phone]  Aldona Lento, PTA 04/22/2021, 4:28 PM  [place], [place], [address] Phone: [phone]   Fax:  [phone]  Name: [name] MRN: [id number] Date of Birth: 01-30-1961

## 2021-04-24 ENCOUNTER — Ambulatory Visit (HOSPITAL_COMMUNITY): Payer: Medicare Other

## 2021-04-24 ENCOUNTER — Encounter (HOSPITAL_COMMUNITY): Payer: Self-pay

## 2021-04-24 ENCOUNTER — Other Ambulatory Visit: Payer: Self-pay

## 2021-04-24 DIAGNOSIS — M25562 Pain in left knee: Secondary | ICD-10-CM | POA: Diagnosis not present

## 2021-04-24 DIAGNOSIS — R2689 Other abnormalities of gait and mobility: Secondary | ICD-10-CM

## 2021-04-24 DIAGNOSIS — M25662 Stiffness of left knee, not elsewhere classified: Secondary | ICD-10-CM

## 2021-04-24 NOTE — Therapy (Signed)
[place], [place], [address] Phone: [phone]   Fax:  [phone]  Physical Therapy Treatment  Patient Details  Name: [name] MRN: [id number] Date of Birth: 1960-08-09 Referring Provider (PT): Netta Cedars MD   Encounter Date: 04/24/2021   PT End of Session - 04/24/21 1136     Visit Number 9    Number of Visits 18    Date for PT Re-Evaluation 05/11/21    Authorization Type Medicare/ Tricare 2ndary    Progress Note Due on Visit 10    PT Start Time 1128    PT Stop Time 1212    PT Time Calculation (min) 44 min    Activity Tolerance Patient tolerated treatment well    Behavior During Therapy [facility] for tasks assessed/performed             Past Medical History:  Diagnosis Date   Adenomatous polyp 2009   Anxiety    Arthritis    Bipolar 1 disorder ([place])    Constipation    Depression    Diabetes mellitus    Diabetes mellitus, type II ([place])    Diverticula of colon 2009   Generalized headaches    GERD (gastroesophageal reflux disease)    History of hiatal hernia    small   Hypertension    Migraine    Neuropathy    both feet   Obesity    Pelvic floor dysfunction    abnormal anorectal manometry at [facility] in 2009   Plantar fasciitis both feet   Sleep apnea    cpap setting of 3.5    Past Surgical History:  Procedure Laterality Date   ABDOMINAL HYSTERECTOMY     compete   CATARACT EXTRACTION W/PHACO Right 02/15/2017   Procedure: CATARACT EXTRACTION PHACO AND INTRAOCULAR LENS PLACEMENT ([place]);  Surgeon: Rutherford Guys, MD;  [facility]: [facility];  Service: Ophthalmology;  Laterality: Right;  CDE: 4.18   CATARACT EXTRACTION W/PHACO Left 03/01/2017   Procedure: CATARACT EXTRACTION PHACO AND INTRAOCULAR LENS PLACEMENT (I[facility]);  Surgeon: Rutherford Guys, MD;  Location: [facility];  Service: Ophthalmology;  Laterality: Left;  CDE: 2.56   COLON RESECTION    03/30/2002   with end-colostomy and Hartmann's pouch    COLON SURGERY     complicated diverticulitis requiring sigmoid resection with colostomy and subsequent takedown   COLONOSCOPY  12/11/2007   Dr. Gala Romney- marginal prep, normal rectum pancolonic diverticula, adenomatous polyp   COLONOSCOPY  08/28/2003      Wide open colonic anastomosis/Scattered diverticula noted throughout colon/ Small external hemorrhoids   COLONOSCOPY  04/2012   [facility]: hyperplastic polyps, diverticulosis, ileocolonic anastomosis.   COLONOSCOPY WITH PROPOFOL N/A 06/07/2016   Procedure: COLONOSCOPY WITH PROPOFOL;  Surgeon: Daneil Dolin, MD;  [facility];  Service: Endoscopy;  Laterality: N/A;  7:30 am   COLOSTOMY CLOSURE  07/10/2002   ESOPHAGOGASTRODUODENOSCOPY N/A 01/08/2020   Procedure: UPPER GASTROINTESTINAL ENDOSCOPY;  Surgeon: Alphonsa Overall, MD;  [facility];  Service: General;  Laterality: N/A;   ESOPHAGOGASTRODUODENOSCOPY (EGD) WITH PROPOFOL N/A 08/23/2016   Procedure: ESOPHAGOGASTRODUODENOSCOPY (EGD) WITH PROPOFOL;  Surgeon: Alphonsa Overall, MD;  Location: [facility];  Service: General;  Laterality: N/A;   FLEXIBLE SIGMOIDOSCOPY  01/20/2012   RMR: incomplete/attempted colonoscopy. Inadequate prep precluded examination   HEEL SPUR SURGERY Left 09/11/2013   both have been done   KNEE ARTHROSCOPY  02/04/2004    left knee/partial medial meniscectomy.   KNEE SURGERY     3  arthroscopic  2 on left 1 on right   LAPAROSCOPIC GASTRIC BANDING  2009   LAPAROSCOPIC GASTRIC SLEEVE RESECTION N/A 01/08/2020   Procedure: LAPAROSCOPIC SLEEVE GASTRECTOMY;  Surgeon: Alphonsa Overall, MD;  [facility];  Service: General;  Laterality: N/A;   LAPAROSCOPIC SALPINGOOPHERECTOMY  03/30/2002   TOTAL KNEE ARTHROPLASTY Left 03/27/2021   Procedure: TOTAL KNEE ARTHROPLASTY;  Surgeon: Netta Cedars, MD;  [facility];  Service: Orthopedics;  Laterality: Left;   TUBAL LIGATION      There were no vitals filed for this visit.   Subjective Assessment - 04/24/21 1133     Subjective  Pt stated no dizziness today, feels may be related to medication.  Lt knee feels good today, maybe 2/10, reports Rt knee is bothering her more than Lt today 6/10.    Pertinent History Lt TKA 03/27/21    Patient Stated Goals Be able to walk better, benefit knee, improve ROM    Currently in Pain? Yes    Pain Score 2     Pain Location Knee    Pain Orientation Left    Pain Descriptors / Indicators Sore;Tightness    Pain Type Surgical pain    Pain Onset 1 to 4 weeks ago    Pain Frequency Constant    Aggravating Factors  WB, standing, bending    Pain Relieving Factors meds, rest, ice    Effect of Pain on Daily Activities limits    Multiple Pain Sites Yes    Pain Score 6    Pain Location Knee    Pain Orientation Left    Pain Descriptors / Indicators Sore;Aching    Pain Type Surgical pain    Aggravating Factors  WB, standing, bending    Pain Relieving Factors meds, rest, ice                [facility] PT Assessment - 04/24/21 0001       Assessment   Medical Diagnosis LT TKA    Referring Provider (PT) Netta Cedars MD    Onset Date/Surgical Date 03/27/21    Next MD Visit 05/14/20    Prior Therapy Yes      Precautions   Precautions None                           [facility] Adult PT Treatment/Exercise - 04/24/21 0001       Knee/Hip Exercises: Stretches   Active Hamstring Stretch Left;3 reps;30 seconds    Active Hamstring Stretch Limitations standing wiht 12in step    Quad Stretch 3 reps;30 seconds    Quad Stretch Limitations prone with rope    Knee: Self-Stretch to increase Flexion 5 reps;10 seconds    Knee: Self-Stretch Limitations knee drive on 43PI step      Knee/Hip Exercises: Aerobic   Stationary Bike 5' seat 9  full revolution      Knee/Hip Exercises: Standing   Heel Raises 15 reps;5 seconds    Knee Flexion 10 reps;3 sets    Terminal Knee Extension 10 reps;Theraband    Theraband Level (Terminal Knee Extension) Level 3 (Green)    Terminal Knee Extension  Limitations 5" holds    Lateral Step Up Left;10 reps;Hand Hold: 2    Forward Step Up Left;10 reps;Hand Hold: 1    SLS 5" max    SLS with Vectors 5x 5"    Other Standing Knee Exercises tandem 2x 30"      Knee/Hip Exercises: Seated  Long Arc [employer] 2 sets;10 reps    [employer] Weight 3 lbs.    Long [employer] Limitations 10" holds with DF    Sit to [employer] 10 reps;without UE support   eccentric control     Knee/Hip Exercises: Supine   Knee Extension AROM    Knee Extension Limitations 4    Knee Flexion AROM    Knee Flexion Limitations 116      Manual Therapy   Manual Therapy Myofascial release    Manual therapy comments Manual complete separate than rest of tx    Myofascial Release Instructed scar tissue massage x 5 min                     PT Education - 04/24/21 1217     Education Details Instructed scar tissue massage.  Encouraged to keep journal with medication and dizzy episodes to see if related.    Person(s) Educated Patient    Methods Explanation;Demonstration    Comprehension Verbalized understanding              PT Short Term Goals - 04/15/21 1324       PT SHORT TERM GOAL #1   Title Patient will be independent with initial HEP and self-management strategies to improve functional outcomes    Time 3    Period Weeks    Status Achieved    Target Date 04/20/21      PT SHORT TERM GOAL #2   Title Patient will report at least 40% overall improvement in subjective complaint to indicate improvement in ability to perform ADLs.    Time 3    Period Weeks    Status Achieved    Target Date 04/20/21      PT SHORT TERM GOAL #3   Title Patient will have LT knee AROM 5-100 degrees to improve functional mobility and facilitate squatting to pick up items from floor.    Time 3    Period Weeks    Status New    Target Date 04/20/21               PT Long Term Goals - 04/15/21 1325       PT LONG TERM GOAL #1   Title Patient will have LT knee AROM 0-120  degrees to improve functional mobility and facilitate squatting to pick up items from floor.    Time 6    Period Weeks    Status New    Target Date 05/11/21      PT LONG TERM GOAL #2   Title Patient will report at least 75% overall improvement in subjective complaint to indicate improvement in ability to perform ADLs.    Baseline 70%    Time 6    Period Weeks    Status On-going    Target Date 05/11/21      PT LONG TERM GOAL #3   Title Patient will improve FOTO score to predicted value to indicate improvement in functional outcomes    Time 6    Period Weeks    Status On-going    Target Date 05/11/21      PT LONG TERM GOAL #4   Title Patient will have Lt knee flexion/ extension MMT equal to or > 4+/5 for improved ability to perform functional mobility, stair ambulation and ADLs.    Time 6    Period Weeks    Status New    Target Date 05/11/21  PT LONG TERM GOAL #5   Title Patient will be able to ambulate at least 375 feet during 2MWT with LRAD to demonstrate improved ability to perform functional mobility and associated tasks.    Time 6    Period Weeks    Status New    Target Date 05/11/21                   Plan - 04/24/21 1213     Clinical Impression Statement Pt progressing well towards POC.  Added quad stretch with improved AROM 4-116.  Progressed quad strengthening with additional step up training with good control noted.  Pt continues to have difficulty with SLS balance activities, encouraged to add to HEP in safe location with HHA available.    Examination-Activity Limitations Bend;Squat;Stairs;Stand;Carry;Transfers;Dressing;Hygiene/Grooming;Lift;Locomotion Level;Toileting;Sleep;Bed Mobility;Bathing;Sit    Examination-Participation Restrictions Community Activity;Shop;Cleaning;Yard Work;Meal Prep;Laundry    Clinical Decision Making Low    Rehab Potential Good    PT Frequency 3x / week    PT Duration 6 weeks    PT Treatment/Interventions ADLs/Self Care  Home Management;Biofeedback;Cryotherapy;Electrical Stimulation;Functional mobility training;Therapeutic activities;Stair training;Orthotic Fit/Training;Energy conservation;Splinting;Vasopneumatic Device;Manual techniques;Manual lymph drainage;Vestibular;Therapeutic exercise;Iontophoresis 4mg /ml Dexamethasone;Moist Heat;Traction;Balance training;Taping;Patient/family education;DME Instruction;Gait training;Contrast Bath;Neuromuscular re-education;Compression bandaging;Fluidtherapy;Parrafin;Ultrasound;Scar mobilization;Visual/perceptual remediation/compensation;Passive range of motion;Spinal Manipulations;Dry needling;Joint Manipulations    PT Next Visit Plan Progress knee strength and mobility exercises as tolerated. progress strengthening and functional mobiltiy    PT Home Exercise Plan Eval: qaud set, heel prop, glute set, heel slide, ankle pump 11/28 SLR 11/30 heel raise, seated hamstring stretch; 12/16: SLS and tandem stance by counter or sink    Consulted and Agree with Plan of Care Patient             Patient will benefit from skilled therapeutic intervention in order to improve the following deficits and impairments:  Abnormal gait, Decreased endurance, Hypomobility, Increased edema, Decreased activity tolerance, Decreased strength, Increased fascial restricitons, Pain, Decreased balance, Decreased mobility, Difficulty walking, Impaired flexibility, Improper body mechanics, Decreased range of motion  Visit Diagnosis: Left knee pain, unspecified chronicity  Stiffness of left knee, not elsewhere classified  Other abnormalities of gait and mobility     Problem List Patient Active Problem List   Diagnosis Date Noted   H/O total knee replacement, left 03/27/2021   Morbid obesity with BMI of 45.0-49.9, adult ([place]) 01/08/2020   Uncontrolled type 2 diabetes mellitus with hyperglycemia ([place]) 05/21/2019   Essential hypertension, benign 05/21/2019   Mixed hyperlipidemia 05/21/2019   Plantar  fasciitis of right foot 06/14/2018   Osteoarthritis of left knee 04/05/2018   Osteoarthritis of right knee 04/05/2018   Hx of adenomatous colonic polyps 06/04/2016   LLQ pain 12/31/2014   Abdominal pain, RLQ (right lower quadrant) 04/24/2014   OSA on CPAP 10/18/2013   Alcohol abuse 01/01/2013   Migraine without aura, with intractable migraine, so stated, without mention of status migrainosus 09/21/2012   Memory loss 09/21/2012   Lapband APL Nov 2009 06/21/2012   Outbursts of anger 03/31/2012   FH: colon cancer 11/03/2011   Rectal bleed 11/03/2011   Esophageal dysphagia 11/03/2011   COLONIC POLYPS 10/10/2008   DIABETES MELLITUS, TYPE II 09/02/2008   Obesity 09/02/2008   ANXIETY NEUROSIS 09/02/2008   Severe mood disorder without psychotic features ([place]) 09/02/2008   GERD 09/02/2008   Constipation 09/02/2008   OSTEOARTHRITIS 09/02/2008   Ihor Austin, LPTA/CLT; CBIS [phone]  Aldona Lento, PTA 04/24/2021, 12:20 PM  [place], [place], [address] Phone:  [phone]   Fax:  [phone]  Name: [name] MRN: [id number] Date of Birth: Jun 23, 1960

## 2021-04-27 DIAGNOSIS — G4733 Obstructive sleep apnea (adult) (pediatric): Secondary | ICD-10-CM | POA: Diagnosis not present

## 2021-04-27 DIAGNOSIS — G4730 Sleep apnea, unspecified: Secondary | ICD-10-CM | POA: Diagnosis not present

## 2021-04-28 ENCOUNTER — Ambulatory Visit (HOSPITAL_COMMUNITY): Payer: Medicare Other

## 2021-04-28 ENCOUNTER — Other Ambulatory Visit: Payer: Self-pay

## 2021-04-28 DIAGNOSIS — M25662 Stiffness of left knee, not elsewhere classified: Secondary | ICD-10-CM | POA: Diagnosis not present

## 2021-04-28 DIAGNOSIS — M25562 Pain in left knee: Secondary | ICD-10-CM | POA: Diagnosis not present

## 2021-04-28 DIAGNOSIS — R2689 Other abnormalities of gait and mobility: Secondary | ICD-10-CM | POA: Diagnosis not present

## 2021-04-28 NOTE — Therapy (Signed)
[facility] [address] [place], [place], [address] Phone: [phone]   Fax:  [phone]  Physical Therapy Treatment and D/C Summary  Patient Details  Name: [name] MRN: [id number] Date of Birth: [birth year] Referring Provider (PT): Netta Cedars MD  PHYSICAL THERAPY DISCHARGE SUMMARY  Visits from Start of Care: 10  Current functional level related to goals / functional outcomes: Met STG/LTG   Remaining deficits: No functional deficits   Education / Equipment: HEP   Patient agrees to discharge. Patient goals were met. Patient is being discharged due to meeting the stated rehab goals.  Encounter Date: 04/28/2021   PT End of Session - 04/28/21 1625     Visit Number 10    Number of Visits 18    Date for PT Re-Evaluation 05/11/21    Authorization Type Medicare/ Tricare 2ndary    Progress Note Due on Visit 20    PT Start Time 1550    PT Stop Time 1635    PT Time Calculation (min) 45 min    Activity Tolerance Patient tolerated treatment well    Behavior During Therapy [facility] for tasks assessed/performed             Past Medical History:  Diagnosis Date   Adenomatous polyp 2009   Anxiety    Arthritis    Bipolar 1 disorder ([place])    Constipation    Depression    Diabetes mellitus    Diabetes mellitus, type II ([place])    Diverticula of colon 2009   Generalized headaches    GERD (gastroesophageal reflux disease)    History of hiatal hernia    small   Hypertension    Migraine    Neuropathy    both feet   Obesity    Pelvic floor dysfunction    abnormal anorectal manometry at [facility] in 2009   Plantar fasciitis both feet   Sleep apnea    cpap setting of 3.5    Past Surgical History:  Procedure Laterality Date   ABDOMINAL HYSTERECTOMY     compete   CATARACT EXTRACTION W/PHACO Right 02/15/2017   Procedure: CATARACT EXTRACTION PHACO AND INTRAOCULAR LENS PLACEMENT ([place]);  Surgeon: Rutherford Guys, MD;  [facility];  Service: Ophthalmology;  Laterality: Right;  CDE: 4.18   CATARACT EXTRACTION W/PHACO Left 03/01/2017   Procedure: CATARACT EXTRACTION PHACO AND INTRAOCULAR LENS PLACEMENT ([facility]);  Surgeon: Rutherford Guys, MD;  Location: [facility];  Service: Ophthalmology;  Laterality: Left;  CDE: 2.56   COLON RESECTION    03/30/2002   with end-colostomy and Hartmann's pouch   COLON SURGERY     complicated diverticulitis requiring sigmoid resection with colostomy and subsequent takedown   COLONOSCOPY  12/11/2007   Dr. Gala Romney- marginal prep, normal rectum pancolonic diverticula, adenomatous polyp   COLONOSCOPY  08/28/2003      Wide open colonic anastomosis/Scattered diverticula noted throughout colon/ Small external hemorrhoids   COLONOSCOPY  04/2012   [facility]: hyperplastic polyps, diverticulosis, ileocolonic anastomosis.   COLONOSCOPY WITH PROPOFOL N/A 06/07/2016   Procedure: COLONOSCOPY WITH PROPOFOL;  Surgeon: Daneil Dolin, MD;  [facility];  Service: Endoscopy;  Laterality: N/A;  7:30 am   COLOSTOMY CLOSURE  07/10/2002   ESOPHAGOGASTRODUODENOSCOPY N/A 01/08/2020   Procedure: UPPER GASTROINTESTINAL ENDOSCOPY;  Surgeon: Alphonsa Overall, MD;  [facility];  Service: General;  Laterality: N/A;   ESOPHAGOGASTRODUODENOSCOPY (EGD) WITH PROPOFOL N/A 08/23/2016   Procedure: ESOPHAGOGASTRODUODENOSCOPY (EGD) WITH PROPOFOL;  Surgeon: Alphonsa Overall, MD;  [facility];  Service: General;  Laterality: N/A;   FLEXIBLE SIGMOIDOSCOPY  01/20/2012   RMR: incomplete/attempted colonoscopy. Inadequate prep precluded examination   HEEL SPUR SURGERY Left 09/11/2013   both have been done   KNEE ARTHROSCOPY  02/04/2004    left knee/partial medial meniscectomy.   KNEE SURGERY     3 arthroscopic  2 on left 1 on right   LAPAROSCOPIC GASTRIC BANDING  2009   LAPAROSCOPIC GASTRIC SLEEVE RESECTION N/A 01/08/2020   Procedure: LAPAROSCOPIC SLEEVE GASTRECTOMY;  Surgeon: Alphonsa Overall, MD;  [facility];  Service:  General;  Laterality: N/A;   LAPAROSCOPIC SALPINGOOPHERECTOMY  03/30/2002   TOTAL KNEE ARTHROPLASTY Left 03/27/2021   Procedure: TOTAL KNEE ARTHROPLASTY;  Surgeon: Netta Cedars, MD;  [facility];  Service: Orthopedics;  Laterality: Left;   TUBAL LIGATION      There were no vitals filed for this visit.   Subjective Assessment - 04/28/21 1557     Subjective Overall feeling much better and walking for longer periods.    Pertinent History Lt TKA 03/27/21    Patient Stated Goals Be able to walk better, benefit knee, improve ROM    Currently in Pain? Yes    Pain Score 3     Pain Location Knee    Pain Orientation Left    Pain Descriptors / Indicators Sore    Pain Type Surgical pain    Pain Onset 1 to 4 weeks ago                [facility] PT Assessment - 04/28/21 0001       Assessment   Medical Diagnosis LT TKA    Referring Provider (PT) Netta Cedars MD    Onset Date/Surgical Date 03/27/21    Next MD Visit 05/14/20    Prior Therapy Yes      Observation/Other Assessments   Focus on Therapeutic Outcomes (FOTO)  53% function      AROM   Left Knee Extension -2   lacking   Left Knee Flexion 115      Strength   Strength Assessment Site Knee    Right/Left Knee Left    Left Knee Flexion 5/5    Left Knee Extension 5/5      Ambulation/Gait   Ambulation/Gait Yes    Ambulation/Gait Assistance 7: Independent    Ambulation Distance (Feet) 400 Feet    Assistive device None    Gait Pattern Within Functional Limits    Ambulation Surface Level;Indoor    Gait Comments 2MWT                           [facility] Adult PT Treatment/Exercise - 04/28/21 0001       Knee/Hip Exercises: Aerobic   Stationary Bike 5' seat 9  full revolution      Knee/Hip Exercises: Standing   Heel Raises Both;2 sets;10 reps    Knee Flexion Strengthening;Left;3 sets;10 reps    Knee Flexion Limitations 5#    Lateral Step Up Left;2 sets;10 reps;Hand Hold: 2;Step Height: 6"    Forward Step  Up Left;2 sets;10 reps    Forward Step Up Limitations 1x10 with power up    Other Standing Knee Exercises tandem 2x 30"      Knee/Hip Exercises: Seated   Long Arc Quad Strengthening;3 sets;10 reps    Long Arc Quad Weight 5 lbs.    Long [employer] Limitations 3 sec hold    Sit to  Sand 10 reps;without UE support                       PT Short Term Goals - 04/28/21 1638       PT SHORT TERM GOAL #1   Title Patient will be independent with initial HEP and self-management strategies to improve functional outcomes    Time 3    Period Weeks    Status Achieved    Target Date 04/20/21      PT SHORT TERM GOAL #2   Title Patient will report at least 40% overall improvement in subjective complaint to indicate improvement in ability to perform ADLs.    Time 3    Period Weeks    Status Achieved    Target Date 04/20/21      PT SHORT TERM GOAL #3   Title Patient will have LT knee AROM 5-100 degrees to improve functional mobility and facilitate squatting to pick up items from floor.    Time 3    Period Weeks    Status Achieved    Target Date 04/20/21               PT Long Term Goals - 04/28/21 1631       PT LONG TERM GOAL #1   Title Patient will have LT knee AROM 0-120 degrees to improve functional mobility and facilitate squatting to pick up items from floor.    Baseline 0-115    Time 6    Period Weeks    Status Partially Met    Target Date 05/11/21      PT LONG TERM GOAL #2   Title Patient will report at least 75% overall improvement in subjective complaint to indicate improvement in ability to perform ADLs.    Baseline 80% improved    Time 6    Period Weeks    Status Achieved    Target Date 05/11/21      PT LONG TERM GOAL #3   Title Patient will improve FOTO score to predicted value to indicate improvement in functional outcomes    Baseline 53% function    Time 6    Period Weeks    Status Achieved    Target Date 05/11/21      PT LONG TERM GOAL #4    Title Patient will have Lt knee flexion/ extension MMT equal to or > 4+/5 for improved ability to perform functional mobility, stair ambulation and ADLs.    Baseline 5/5 LLE strength    Time 6    Period Weeks    Status Achieved    Target Date 05/11/21      PT LONG TERM GOAL #5   Title Patient will be able to ambulate at least 375 feet during with LRAD to demonstrate improved ability to perform functional mobility and associated tasks.    Baseline 400 ft during minimal deviation    Time 6    Period Weeks    Status Achieved    Target Date 05/11/21                    Patient will benefit from skilled therapeutic intervention in order to improve the following deficits and impairments:     Visit Diagnosis: Left knee pain, unspecified chronicity  Stiffness of left knee, not elsewhere classified  Other abnormalities of gait and mobility     Problem List Patient Active Problem List   Diagnosis Date Noted  H/O total knee replacement, left 03/27/2021   Morbid obesity with BMI of 45.0-49.9, adult ([place]) 01/08/2020   Uncontrolled type 2 diabetes mellitus with hyperglycemia ([place]) 05/21/2019   Essential hypertension, benign 05/21/2019   Mixed hyperlipidemia 05/21/2019   Plantar fasciitis of right foot 06/14/2018   Osteoarthritis of left knee 04/05/2018   Osteoarthritis of right knee 04/05/2018   Hx of adenomatous colonic polyps 06/04/2016   LLQ pain 12/31/2014   Abdominal pain, RLQ (right lower quadrant) 04/24/2014   OSA on CPAP 10/18/2013   Alcohol abuse 01/01/2013   Migraine without aura, with intractable migraine, so stated, without mention of status migrainosus 09/21/2012   Memory loss 09/21/2012   Lapband APL Nov 2009 06/21/2012   Outbursts of anger 03/31/2012   FH: colon cancer 11/03/2011   Rectal bleed 11/03/2011   Esophageal dysphagia 11/03/2011   COLONIC POLYPS 10/10/2008   DIABETES MELLITUS, TYPE II 09/02/2008   Obesity 09/02/2008   ANXIETY  NEUROSIS 09/02/2008   Severe mood disorder without psychotic features ([place]) 09/02/2008   GERD 09/02/2008   Constipation 09/02/2008   OSTEOARTHRITIS 09/02/2008    Toniann Fail, PT 04/28/2021, 4:40 PM  [facility] [address] [place], [place], [address] Phone: [phone]   Fax:  [phone]  Name: [name] MRN: [id number] Date of Birth: Sep 25, 1960

## 2021-04-29 ENCOUNTER — Encounter (HOSPITAL_COMMUNITY): Payer: Self-pay

## 2021-05-06 DIAGNOSIS — E1165 Type 2 diabetes mellitus with hyperglycemia: Secondary | ICD-10-CM | POA: Diagnosis not present

## 2021-05-13 ENCOUNTER — Other Ambulatory Visit: Payer: Self-pay | Admitting: Physical Medicine and Rehabilitation

## 2021-05-13 ENCOUNTER — Other Ambulatory Visit (HOSPITAL_COMMUNITY): Payer: Self-pay | Admitting: Physical Medicine and Rehabilitation

## 2021-05-13 DIAGNOSIS — M5459 Other low back pain: Secondary | ICD-10-CM

## 2021-05-14 ENCOUNTER — Encounter (HOSPITAL_COMMUNITY): Payer: Self-pay | Admitting: Psychiatry

## 2021-05-14 ENCOUNTER — Other Ambulatory Visit: Payer: Self-pay

## 2021-05-14 ENCOUNTER — Telehealth (INDEPENDENT_AMBULATORY_CARE_PROVIDER_SITE_OTHER): Payer: Medicare Other | Admitting: Psychiatry

## 2021-05-14 DIAGNOSIS — F3162 Bipolar disorder, current episode mixed, moderate: Secondary | ICD-10-CM

## 2021-05-14 MED ORDER — ALPRAZOLAM 1 MG PO TABS: 1.0000 mg | ORAL_TABLET | Freq: Three times a day (TID) | ORAL | 1 refills | Status: DC | PRN

## 2021-05-14 MED ORDER — TEMAZEPAM 30 MG PO CAPS: 30.0000 mg | ORAL_CAPSULE | Freq: Every evening | ORAL | 2 refills | Status: DC | PRN

## 2021-05-14 MED ORDER — ESCITALOPRAM OXALATE 20 MG PO TABS: 20.0000 mg | ORAL_TABLET | Freq: Every day | ORAL | 2 refills | Status: DC

## 2021-05-14 MED ORDER — ARIPIPRAZOLE 10 MG PO TABS: 10.0000 mg | ORAL_TABLET | Freq: Every day | ORAL | 2 refills | Status: DC

## 2021-05-14 NOTE — Progress Notes (Signed)
Virtual Visit via Video Note  I connected with [name] on 05/14/21 at  4:00 PM EST by a video enabled telemedicine application and verified that I am speaking with the correct person using two identifiers.  Location: Patient: home Provider: office   I discussed the limitations of evaluation and management by telemedicine and the availability of in person appointments. The patient expressed understanding and agreed to proceed.     I discussed the assessment and treatment plan with the patient. The patient was provided an opportunity to ask questions and all were answered. The patient agreed with the plan and demonstrated an understanding of the instructions.   The patient was advised to call back or seek an in-person evaluation if the symptoms worsen or if the condition fails to improve as anticipated.  I provided 15 minutes of non-face-to-face time during this encounter.   Levonne Spiller, MD  [facility] MD/PA/NP OP Progress Note  05/14/2021 3:58 PM [name]  MRN:  [id number]  Chief Complaint:  Chief Complaint   Anxiety; Depression; Follow-up    HPI: This patient is a 61 year old married white female who lives with her husband in [place].  She has 2 sons who live outside the home.  She is on disability.  The patient returns after 6 weeks regarding her depression and anxiety.  She had left knee replacement done in November.  She is gone through all the rehab and is finally getting better.  However her right knee is very painful as is her back.  She states that the back makes her quite irritable.  The oxycodone does not really relieve the pain nor does she did not want to take it very often.  She is going to have right knee replacement in April.  She may be getting injections in her back to help.  She states that her mood has been steady for the most part she denies significant depression anxiety.  The pain does keep her awake at times which usually gets 5 to 6 hours.  She  denies thoughts of self-harm or suicide Visit Diagnosis:    ICD-10-CM   1. Bipolar 1 disorder, mixed, moderate ([place])  F31.62       Past Psychiatric History: Long-term outpatient treatment  Past Medical History:  Past Medical History:  Diagnosis Date   Adenomatous polyp 2009   Anxiety    Arthritis    Bipolar 1 disorder ([place])    Constipation    Depression    Diabetes mellitus    Diabetes mellitus, type II ([place])    Diverticula of colon 2009   Generalized headaches    GERD (gastroesophageal reflux disease)    History of hiatal hernia    small   Hypertension    Migraine    Neuropathy    both feet   Obesity    Pelvic floor dysfunction    abnormal anorectal manometry at [facility] in 2009   Plantar fasciitis both feet   Sleep apnea    cpap setting of 3.5    Past Surgical History:  Procedure Laterality Date   ABDOMINAL HYSTERECTOMY     compete   CATARACT EXTRACTION W/PHACO Right 02/15/2017   Procedure: CATARACT EXTRACTION PHACO AND INTRAOCULAR LENS PLACEMENT ([place]);  Surgeon: Rutherford Guys, MD;  [facility]: [facility];  Service: Ophthalmology;  Laterality: Right;  CDE: 4.18   CATARACT EXTRACTION W/PHACO Left 03/01/2017   Procedure: CATARACT EXTRACTION PHACO AND INTRAOCULAR LENS PLACEMENT ([facility]);  Surgeon: Rutherford Guys, MD;  Location: [facility];  Service: Ophthalmology;  Laterality: Left;  CDE: 2.56   COLON RESECTION    03/30/2002   with end-colostomy and Hartmann's pouch   COLON SURGERY     complicated diverticulitis requiring sigmoid resection with colostomy and subsequent takedown   COLONOSCOPY  12/11/2007   Dr. Gala Romney- marginal prep, normal rectum pancolonic diverticula, adenomatous polyp   COLONOSCOPY  08/28/2003      Wide open colonic anastomosis/Scattered diverticula noted throughout colon/ Small external hemorrhoids   COLONOSCOPY  04/2012   [facility]: hyperplastic polyps, diverticulosis, ileocolonic anastomosis.   COLONOSCOPY WITH PROPOFOL N/A 06/07/2016   Procedure: COLONOSCOPY WITH  PROPOFOL;  Surgeon: Daneil Dolin, MD;  [facility];  Service: Endoscopy;  Laterality: N/A;  7:30 am   COLOSTOMY CLOSURE  07/10/2002   ESOPHAGOGASTRODUODENOSCOPY N/A 01/08/2020   Procedure: UPPER GASTROINTESTINAL ENDOSCOPY;  Surgeon: Alphonsa Overall, MD;  [facility];  Service: General;  Laterality: N/A;   ESOPHAGOGASTRODUODENOSCOPY (EGD) WITH PROPOFOL N/A 08/23/2016   Procedure: ESOPHAGOGASTRODUODENOSCOPY (EGD) WITH PROPOFOL;  Surgeon: Alphonsa Overall, MD;  Location: [facility];  Service: General;  Laterality: N/A;   FLEXIBLE SIGMOIDOSCOPY  01/20/2012   RMR: incomplete/attempted colonoscopy. Inadequate prep precluded examination   HEEL SPUR SURGERY Left 09/11/2013   both have been done   KNEE ARTHROSCOPY  02/04/2004    left knee/partial medial meniscectomy.   KNEE SURGERY     3 arthroscopic  2 on left 1 on right   LAPAROSCOPIC GASTRIC BANDING  2009   LAPAROSCOPIC GASTRIC SLEEVE RESECTION N/A 01/08/2020   Procedure: LAPAROSCOPIC SLEEVE GASTRECTOMY;  Surgeon: Alphonsa Overall, MD;  [facility];  Service: General;  Laterality: N/A;   LAPAROSCOPIC SALPINGOOPHERECTOMY  03/30/2002   TOTAL KNEE ARTHROPLASTY Left 03/27/2021   Procedure: TOTAL KNEE ARTHROPLASTY;  Surgeon: Netta Cedars, MD;  [facility];  Service: Orthopedics;  Laterality: Left;   TUBAL LIGATION      Family Psychiatric History: see below  Family History:  Family History  Problem Relation Age of Onset   Ovarian cancer Mother    Heart disease Mother    Anxiety disorder Mother    Cirrhosis Father        deceased age 53   Alcohol abuse Father    Liver cancer Cousin        age 25, deceased   Drug abuse Cousin    ADD / ADHD Son    Anxiety disorder Sister    Dementia Maternal Grandfather    Colon cancer Other        aunt, deceased age 41   Breast cancer Other        aunt, deceased age 24   Bipolar disorder Neg Hx    Depression Neg Hx    OCD Neg Hx    Paranoid behavior Neg Hx    Schizophrenia Neg Hx     Seizures Neg Hx    Sexual abuse Neg Hx    Physical abuse Neg Hx     Social History:  Social History   Socioeconomic History   Marital status: Married    Spouse name: Not on file   Number of children: 2   Years of education: college   Highest education level: Not on file  Occupational History   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    Employer: DISABLED  Tobacco Use   Smoking status: Never   Smokeless tobacco: Never  Vaping Use   Vaping Use: Never used  Substance and Sexual Activity   Alcohol use:  Not Currently   Drug use: Never   Sexual activity: Not Currently    Birth control/protection: Surgical  Other Topics Concern   Not on file  Social History Narrative   Not on file   Social Determinants of Health   Financial Resource Strain: Not on file  Food Insecurity: Not on file  Transportation Needs: Not on file  Physical Activity: Not on file  Stress: Not on file  Social Connections: Not on file    Allergies:  Allergies  Allergen Reactions   Bactrim Itching, Nausea And Vomiting and Rash    Bactrim brand name. Redness.    Metabolic Disorder Labs: Lab Results  Component Value Date   HGBA1C 6.5 (H) 03/17/2021   MPG 139.85 03/17/2021   MPG 232 01/01/2020   No results found for: PROLACTIN Lab Results  Component Value Date   CHOL 175 08/13/2020   TRIG 122 08/13/2020   HDL 52 08/13/2020   CHOLHDL 3.4 08/13/2020   LDLCALC 101 (H) 08/13/2020   LDLCALC 119 (H) 12/06/2019   Lab Results  Component Value Date   TSH 1.310 08/13/2020   TSH 1.560 12/06/2019    Therapeutic Level Labs: No results found for: LITHIUM No results found for: VALPROATE No components found for:  CBMZ  Current Medications: Current Outpatient Medications  Medication Sig Dispense Refill   ALPRAZolam (XANAX) 1 MG tablet Take 1 tablet (1 mg total) by mouth 3 (three) times daily as needed for anxiety. 270 tablet 1   ARIPiprazole (ABILIFY) 10 MG tablet Take 1 tablet (10  mg total) by mouth at bedtime. 90 tablet 2   ascorbic acid (VITAMIN C) 500 MG tablet Take 500 mg by mouth daily.     Blood Glucose Monitoring Suppl (FREESTYLE LITE) DEVI Use to measure glucose 4 times a day 1 each 0   CALCIUM-VITAMIN D-VITAMIN K PO Take 1 tablet by mouth daily.     Continuous Blood Gluc Receiver (FREESTYLE LIBRE 2 READER) DEVI As directed 1 each 0   Continuous Blood Gluc Sensor (FREESTYLE LIBRE 2 SENSOR) MISC 1 Piece by Does not apply route every 14 (fourteen) days. 2 each 3   cycloSPORINE (RESTASIS) 0.05 % ophthalmic emulsion Place 2 drops into both eyes 2 (two) times daily.      diclofenac (VOLTAREN) 75 MG EC tablet Take 75 mg by mouth 2 (two) times daily.     Dulaglutide (TRULICITY) 1.5 RJ/1.8AC SOPN Inject 1.5 mg into the skin once a week. 2 mL 2   escitalopram (LEXAPRO) 20 MG tablet Take 1 tablet (20 mg total) by mouth daily. 90 tablet 2   fluticasone (FLONASE) 50 MCG/ACT nasal spray Place 2 sprays into the nose daily as needed for allergies.      gabapentin (NEURONTIN) 100 MG capsule Take 1 capsule (100 mg total) by mouth 2 (two) times daily. 180 capsule 0   glucose blood test strip Test 4 times a day, she has freestyle lite meter 150 each 2   HYDROcodone-acetaminophen (NORCO/VICODIN) 5-325 MG tablet Take 1 tablet by mouth every 6 (six) hours as needed for pain.     Insulin Pen Needle (B-D ULTRAFINE III SHORT PEN) 31G X 8 MM MISC 1 each by Does not apply route as directed. 100 each 3   loratadine (CLARITIN) 10 MG tablet Take 10 mg by mouth daily.     meclizine (ANTIVERT) 25 MG tablet Take 25 mg by mouth 2 (two) times daily as needed for dizziness.   2   Menthol, Topical  Analgesic, (ICY HOT EX) Apply 1 application topically 4 (four) times daily as needed (joint pain.).     methocarbamol (ROBAXIN) 500 MG tablet Take 1 tablet (500 mg total) by mouth every 8 (eight) hours as needed for muscle spasms. 40 tablet 1   Multiple Vitamin (MULTIVITAMIN WITH MINERALS) TABS tablet Take 1  tablet by mouth daily.     ondansetron (ZOFRAN) 4 MG tablet Take 1 tablet (4 mg total) by mouth every 8 (eight) hours as needed for nausea, vomiting or refractory nausea / vomiting. 30 tablet 1   oxyCODONE-acetaminophen (PERCOCET) 5-325 MG tablet Take 1 tablet by mouth every 4 (four) hours as needed for severe pain. 30 tablet 0   pantoprazole (PROTONIX) 40 MG tablet Take 1 tablet (40 mg total) by mouth 2 (two) times daily. (Patient taking differently: Take 40 mg by mouth daily before breakfast.) 180 tablet 3   Probiotic Product (PROBIOTIC PO) Take 1 tablet by mouth daily.     Propylene Glycol-Glycerin 0.6-0.6 % SOLN Place 1 drop into both eyes 2 (two) times daily.     rizatriptan (MAXALT-MLT) 10 MG disintegrating tablet Take 10 mg by mouth as needed for migraine. May repeat in 2 hours if needed     temazepam (RESTORIL) 30 MG capsule Take 1 capsule (30 mg total) by mouth at bedtime as needed for sleep. 90 capsule 2   No current facility-administered medications for this visit.     Musculoskeletal: Strength & Muscle Tone: within normal limits Gait & Station: normal Patient leans: N/A  Psychiatric Specialty Exam: Review of Systems  Musculoskeletal:  Positive for arthralgias, back pain and joint swelling.  All other systems reviewed and are negative.  There were no vitals taken for this visit.There is no height or weight on file to calculate BMI.  General Appearance: Casual, Neat, and Well Groomed  Eye Contact:  Good  Speech:  Clear and Coherent  Volume:  Normal  Mood:  Euthymic  Affect:  Appropriate and Congruent  Thought Process:  Goal Directed  Orientation:  Full (Time, Place, and Person)  Thought Content: WDL   Suicidal Thoughts:  No  Homicidal Thoughts:  No  Memory:  Immediate;   Good Recent;   Good Remote;   NA  Judgement:  Good  Insight:  Fair  Psychomotor Activity:  Normal  Concentration:  Concentration: Good and Attention Span: Good  Recall:  Good  Fund of Knowledge:  Good  Language: Good  Akathisia:  No  Handed:  Right  AIMS (if indicated): not done  Assets:  Communication Skills Desire for Improvement Resilience Social Support Talents/Skills  ADL's:  Intact  Cognition: WNL  Sleep:  Fair   Screenings: PHQ2-9    Flowsheet Row Video Visit from 05/14/2021 in [facility] Video Visit from 12/30/2020 in [facility] Video Visit from 08/14/2020 in [facility] Nutrition from 04/20/2019 in [facility] Office Visit from 07/13/2018 in [facility]  PHQ-2 Total Score 1 0 0 0 0      Flowsheet Row Video Visit from 05/14/2021 in [facility] (Discharged) from 03/27/2021 in [place] Video Visit from 12/30/2020 in [place] No Risk No Risk No Risk        Assessment and Plan: This patient is a 61 year old female with a history of bipolar disorder.  She is doing well in her recovery from  surgery but is still having pain in the other knee and back.  Hopefully this will be alleviated by further treatments.  For now she is stable in terms of mood so she will continue Lexapro 20 mg daily for depression, Abilify 10 mg at bedtime for mood stabilization, Restoril 30 mg at bedtime for sleep and Xanax 1 mg 3 times daily for anxiety.  She will return to see me in 3 months   Levonne Spiller, MD 05/14/2021, 3:58 PM

## 2021-05-20 ENCOUNTER — Encounter (HOSPITAL_COMMUNITY): Payer: Self-pay | Admitting: Physical Therapy

## 2021-05-20 ENCOUNTER — Ambulatory Visit (HOSPITAL_COMMUNITY): Payer: Medicare Other | Attending: Family Medicine | Admitting: Physical Therapy

## 2021-05-20 ENCOUNTER — Other Ambulatory Visit: Payer: Self-pay

## 2021-05-20 DIAGNOSIS — R42 Dizziness and giddiness: Secondary | ICD-10-CM | POA: Insufficient documentation

## 2021-05-20 NOTE — Therapy (Signed)
[facility] [address] [place], [place], [address] Phone: [phone]   Fax:  [phone]  Physical Therapy Evaluation  Patient Details  Name: [name] MRN: [id number] Date of Birth: 03/24/1961 Referring Provider (PT): Sharilyn Sites, MD   Encounter Date: 05/20/2021   PT End of Session - 05/20/21 1441     Visit Number 1    Number of Visits 1    Date for PT Re-Evaluation 05/27/21    Authorization Type [employer]HC medicare no auth - med [place].    PT Start Time 1445    PT Stop Time 1515    PT Time Calculation (min) 30 min    Activity Tolerance Patient tolerated treatment well    Behavior During Therapy WFL for tasks assessed/performed             Past Medical History:  Diagnosis Date   Adenomatous polyp 2009   Anxiety    Arthritis    Bipolar 1 disorder ([place])    Constipation    Depression    Diabetes mellitus    Diabetes mellitus, type II ([place])    Diverticula of colon 2009   Generalized headaches    GERD (gastroesophageal reflux disease)    History of hiatal hernia    small   Hypertension    Migraine    Neuropathy    both feet   Obesity    Pelvic floor dysfunction    abnormal anorectal manometry at [facility] in 2009   Plantar fasciitis both feet   Sleep apnea    cpap setting of 3.5    Past Surgical History:  Procedure Laterality Date   ABDOMINAL HYSTERECTOMY     compete   CATARACT EXTRACTION W/PHACO Right 02/15/2017   Procedure: CATARACT EXTRACTION PHACO AND INTRAOCULAR LENS PLACEMENT ([place]);  Surgeon: Rutherford Guys, MD;  Location[facility];  Service: Ophthalmology;  Laterality: Right;  CDE: 4.18   CATARACT EXTRACTION W/PHACO Left 03/01/2017   Procedure: CATARACT EXTRACTION PHACO AND INTRAOCULAR LENS PLACEMENT ([facility]);  Surgeon: Rutherford Guys, MD;  Location: [facility];  Service: Ophthalmology;  Laterality: Left;  CDE: 2.56   COLON RESECTION    03/30/2002   with end-colostomy and Hartmann's pouch   COLON SURGERY      complicated diverticulitis requiring sigmoid resection with colostomy and subsequent takedown   COLONOSCOPY  12/11/2007   Dr. Gala Romney- marginal prep, normal rectum pancolonic diverticula, adenomatous polyp   COLONOSCOPY  08/28/2003      Wide open colonic anastomosis/Scattered diverticula noted throughout colon/ Small external hemorrhoids   COLONOSCOPY  04/2012   [facility]: hyperplastic polyps, diverticulosis, ileocolonic anastomosis.   COLONOSCOPY WITH PROPOFOL N/A 06/07/2016   Procedure: COLONOSCOPY WITH PROPOFOL;  Surgeon: Daneil Dolin, MD;  [facility];  Service: Endoscopy;  Laterality: N/A;  7:30 am   COLOSTOMY CLOSURE  07/10/2002   ESOPHAGOGASTRODUODENOSCOPY N/A 01/08/2020   Procedure: UPPER GASTROINTESTINAL ENDOSCOPY;  Surgeon: Alphonsa Overall, MD;  [facility];  Service: General;  Laterality: N/A;   ESOPHAGOGASTRODUODENOSCOPY (EGD) WITH PROPOFOL N/A 08/23/2016   Procedure: ESOPHAGOGASTRODUODENOSCOPY (EGD) WITH PROPOFOL;  Surgeon: Alphonsa Overall, MD;  [facility];  Service: General;  Laterality: N/A;   FLEXIBLE SIGMOIDOSCOPY  01/20/2012   RMR: incomplete/attempted colonoscopy. Inadequate prep precluded examination   HEEL SPUR SURGERY Left 09/11/2013   both have been done   KNEE ARTHROSCOPY  02/04/2004    left knee/partial medial meniscectomy.   KNEE SURGERY     3 arthroscopic  2 on left  1 on right   LAPAROSCOPIC GASTRIC BANDING  2009   LAPAROSCOPIC GASTRIC SLEEVE RESECTION N/A 01/08/2020   Procedure: LAPAROSCOPIC SLEEVE GASTRECTOMY;  Surgeon: Alphonsa Overall, MD;  [facility];  Service: General;  Laterality: N/A;   LAPAROSCOPIC SALPINGOOPHERECTOMY  03/30/2002   TOTAL KNEE ARTHROPLASTY Left 03/27/2021   Procedure: TOTAL KNEE ARTHROPLASTY;  Surgeon: Netta Cedars, MD;  [facility];  Service: Orthopedics;  Laterality: Left;   TUBAL LIGATION      There were no vitals filed for this visit.    Subjective Assessment - 05/20/21 1455     Subjective States her  dizziness comes and goes and is usually first thing in the morning when she gets up. States 70% of the time she has the dizziness and she has to sit for a moment before getting up. States she also has motion sickness when she reads in the car. States she has had multiple doctors look at her and they just prescribe her meclizine. Reports she doesnt have any spinning just feels like she is going to fall and feels lightheadedness. States she was on BP but after she lost weight she didnt need it. States that she does note that her BP is low in the morning. Reports no symptoms currently. Reports she sees a shawdow since sher tore her retina right before her knee surgery last year.    Pertinent History Lt TKA 03/27/21, retina tear    Patient Stated Goals Be able to walk better, benefit knee, improve ROM    Currently in Pain? No/denies    Pain Onset 1 to 4 weeks ago                [facility] PT Assessment - 05/20/21 0001       Assessment   Medical Diagnosis dizzines    Referring Provider (PT) Sharilyn Sites, MD    Prior Therapy yes for left TKA                    Vestibular Assessment - 05/20/21 0001       Symptom Behavior   Type of Dizziness  Lightheadedness    Frequency of Dizziness mostly in morning    Duration of Dizziness a couple minutes    Symptom Nature Positional    Aggravating Factors Supine to sit;Sit to stand    Relieving Factors Head stationary;Closing eyes;Rest;Slow movements      Oculomotor Exam   Smooth Pursuits Intact    Saccades Intact    Comment "lightheadedness noted with eye movments"      Oculomotor Exam-Fixation Suppressed    Left Head Impulse intact    Right Head Impulse intact      Vestibulo-Ocular Reflex   Comment neck ROM WNL no symptoms.      Positional Testing   Dix-Hallpike Dix-Hallpike Right      Dix-Hallpike Right   Dix-Hallpike Right Symptoms No nystagmus   nausea, felt like she is falling, improved symptoms with change in position with  testing     Positional Sensitivities   Sit to Supine Severe dizziness    Right Hallpike Severe dizziness                Objective measurements completed on examination: See above findings.        Vestibular Treatment/Exercise - 05/20/21 0001       Vestibular Treatment/Exercise   Gaze Exercises X1 Viewing Horizontal;X1 Viewing Vertical      X1 Viewing Horizontal   Comments seated x10  B      X1 Viewing Vertical   Comments seated x10; saccades 2 tagets vertical and horizontal                    PT Education - 05/20/21 1517     Education Details on current presentation, on HEP, on POC, on exercises and how any provocative movement can be a habituation exercise, on what habituation is and how it works, on monitoring BP    Person(s) Educated Patient    Methods Explanation    Comprehension Verbalized understanding              PT Short Term Goals - 05/20/21 1505       PT SHORT TERM GOAL #1   Title Patient will be independent with initial HEP and self-management strategies to improve functional outcomes    Time 2    Period Weeks    Status New    Target Date 06/03/21      PT SHORT TERM GOAL #2   Title --               PT Long Term Goals - 05/20/21 1505       PT LONG TERM GOAL #2   Period --    Status --    Target Date --                    Plan - 05/20/21 1513     Clinical Impression Statement Patient presents to therapy with complaints of lightheadedness first thing in the morning and when getting up in the AM. Patient with reports of no true vertigo just feeling nauseous and like she is going to fall and notes her BP is low when she transitions to sitting or standing. Educated patient in current presentation and how some symptoms present like orthostatics and some symptoms present with some ocular component. Patient with history of detached retina that is currently being treated by specialist. Patient with desire to get  exercises and work on dizziness at home. Provided patient with exercises and education and today will be one time treatment moving forward, no additional skilled therapy needs noted for dizziness.    Examination-Activity Limitations Bend;Squat;Stairs;Stand;Carry;Transfers;Dressing;Hygiene/Grooming;Lift;Locomotion Level;Toileting;Sleep;Bed Mobility;Bathing;Sit    Examination-Participation Restrictions Community Activity;Shop;Cleaning;Yard Work;Meal Prep;Laundry    Stability/Clinical Decision Making Stable/Uncomplicated    Clinical Decision Making Low    Rehab Potential Good    PT Frequency One time visit    PT Duration --   1 week   PT Treatment/Interventions ADLs/Self Care Home Management;Vestibular;Therapeutic exercise    PT Next Visit Plan one time visit    PT Home Exercise Plan [id number]    Consulted and Agree with Plan of Care Patient             Patient will benefit from skilled therapeutic intervention in order to improve the following deficits and impairments:  Difficulty walking, Dizziness  Visit Diagnosis: Dizziness and giddiness     Problem List Patient Active Problem List   Diagnosis Date Noted   H/O total knee replacement, left 03/27/2021   Morbid obesity with BMI of 45.0-49.9, adult ([place]) 01/08/2020   Uncontrolled type 2 diabetes mellitus with hyperglycemia ([place]) 05/21/2019   Essential hypertension, benign 05/21/2019   Mixed hyperlipidemia 05/21/2019   Plantar fasciitis of right foot 06/14/2018   Osteoarthritis of left knee 04/05/2018   Osteoarthritis of right knee 04/05/2018   Hx of adenomatous colonic polyps 06/04/2016   LLQ pain 12/31/2014  Abdominal pain, RLQ (right lower quadrant) 04/24/2014   OSA on CPAP 10/18/2013   Alcohol abuse 01/01/2013   Migraine without aura, with intractable migraine, so stated, without mention of status migrainosus 09/21/2012   Memory loss 09/21/2012   Lapband APL Nov 2009 06/21/2012   Outbursts of anger 03/31/2012   FH:  colon cancer 11/03/2011   Rectal bleed 11/03/2011   Esophageal dysphagia 11/03/2011   COLONIC POLYPS 10/10/2008   DIABETES MELLITUS, TYPE II 09/02/2008   Obesity 09/02/2008   ANXIETY NEUROSIS 09/02/2008   Severe mood disorder without psychotic features ([place]) 09/02/2008   GERD 09/02/2008   Constipation 09/02/2008   OSTEOARTHRITIS 09/02/2008   3:23 PM, 05/20/21 [name] Pitch, DPT Physical Therapy with [facility]  [phone] office   [facility] [address] [place], [place], [address] Phone: [phone]   Fax:  [phone]  Name: NATANIA FINIGAN MRN: [id number] Date of Birth: 1961/02/25

## 2021-05-21 ENCOUNTER — Ambulatory Visit (HOSPITAL_COMMUNITY)
Admission: RE | Admit: 2021-05-21 | Discharge: 2021-05-21 | Disposition: A | Discharge status: Home / Self Care | Payer: Medicare Other | Source: Ambulatory Visit | Attending: Physical Medicine and Rehabilitation | Admitting: Physical Medicine and Rehabilitation

## 2021-05-21 DIAGNOSIS — M5459 Other low back pain: Secondary | ICD-10-CM | POA: Insufficient documentation

## 2021-05-21 DIAGNOSIS — M5126 Other intervertebral disc displacement, lumbar region: Secondary | ICD-10-CM | POA: Diagnosis not present

## 2021-05-28 DIAGNOSIS — M47816 Spondylosis without myelopathy or radiculopathy, lumbar region: Secondary | ICD-10-CM | POA: Diagnosis not present

## 2021-06-01 ENCOUNTER — Other Ambulatory Visit: Payer: Self-pay | Admitting: "Endocrinology

## 2021-06-03 DIAGNOSIS — H43812 Vitreous degeneration, left eye: Secondary | ICD-10-CM | POA: Diagnosis not present

## 2021-06-03 DIAGNOSIS — H33322 Round hole, left eye: Secondary | ICD-10-CM | POA: Diagnosis not present

## 2021-06-03 DIAGNOSIS — H0100A Unspecified blepharitis right eye, upper and lower eyelids: Secondary | ICD-10-CM | POA: Diagnosis not present

## 2021-06-03 DIAGNOSIS — H0100B Unspecified blepharitis left eye, upper and lower eyelids: Secondary | ICD-10-CM | POA: Diagnosis not present

## 2021-06-06 DIAGNOSIS — E1165 Type 2 diabetes mellitus with hyperglycemia: Secondary | ICD-10-CM | POA: Diagnosis not present

## 2021-06-15 DIAGNOSIS — E1165 Type 2 diabetes mellitus with hyperglycemia: Secondary | ICD-10-CM | POA: Diagnosis not present

## 2021-06-16 LAB — COMPREHENSIVE METABOLIC PANEL
ALT: 12 IU/L (ref 0–32)
AST: 16 IU/L (ref 0–40)
Albumin/Globulin Ratio: 1.6 (ref 1.2–2.2)
Albumin: 4.4 g/dL (ref 3.8–4.8)
Alkaline Phosphatase: 109 IU/L (ref 44–121)
BUN/Creatinine Ratio: 22 (ref 12–28)
BUN: 16 mg/dL (ref 8–27)
Bilirubin Total: 0.3 mg/dL (ref 0.0–1.2)
CO2: 25 mmol/L (ref 20–29)
Calcium: 9.5 mg/dL (ref 8.7–10.3)
Chloride: 101 mmol/L (ref 96–106)
Creatinine, Ser: 0.74 mg/dL (ref 0.57–1.00)
Globulin, Total: 2.7 g/dL (ref 1.5–4.5)
Glucose: 99 mg/dL (ref 70–99)
Potassium: 4.3 mmol/L (ref 3.5–5.2)
Sodium: 140 mmol/L (ref 134–144)
Total Protein: 7.1 g/dL (ref 6.0–8.5)
eGFR: 92 mL/min/{1.73_m2} (ref >59)

## 2021-06-16 LAB — LIPID PANEL
Chol/HDL Ratio: 3.4 ratio (ref 0.0–4.4)
Cholesterol, Total: 186 mg/dL (ref 100–199)
HDL: 54 mg/dL (ref >39)
LDL Chol Calc (NIH): 105 mg/dL — ABNORMAL HIGH (ref 0–99)
Triglycerides: 157 mg/dL — ABNORMAL HIGH (ref 0–149)
VLDL Cholesterol Cal: 27 mg/dL (ref 5–40)

## 2021-06-16 LAB — TSH: TSH: 1.06 u[IU]/mL (ref 0.450–4.500)

## 2021-06-16 LAB — VITAMIN D 25 HYDROXY (VIT D DEFICIENCY, FRACTURES): Vit D, 25-Hydroxy: 34.8 ng/mL (ref 30.0–100.0)

## 2021-06-16 LAB — T4, FREE: Free T4: 1.44 ng/dL (ref 0.82–1.77)

## 2021-06-22 NOTE — Progress Notes (Signed)
Surgery orders requested via Epic inbox. °

## 2021-06-23 ENCOUNTER — Other Ambulatory Visit: Payer: Self-pay

## 2021-06-23 ENCOUNTER — Ambulatory Visit (INDEPENDENT_AMBULATORY_CARE_PROVIDER_SITE_OTHER): Payer: Medicare Other | Admitting: Adult Health

## 2021-06-23 VITALS — BP 158/88 | HR 68 | Ht 66.0 in | Wt 262.8 lb

## 2021-06-23 DIAGNOSIS — Z9989 Dependence on other enabling machines and devices: Secondary | ICD-10-CM | POA: Diagnosis not present

## 2021-06-23 DIAGNOSIS — G4733 Obstructive sleep apnea (adult) (pediatric): Secondary | ICD-10-CM

## 2021-06-23 NOTE — Progress Notes (Signed)
PATIENT: [name] DOB: [birth year]  REASON FOR VISIT: follow up HISTORY FROM: patient PRIMARY NEUROLOGIST: Dr. Rexene Alberts  HISTORY OF PRESENT ILLNESS: Today 06/23/21 Ms. Fetherolf is a 61 year old female with a history of obstructive sleep apnea on CPAP.  She returns today for follow-up.  Her download indicates that she used her machine 25 out of 30 days for compliance of 83%.  She used her machine greater than 4 hours 13 days for compliance of 43%.  On average she uses her machine 4 hours and 22 minutes.  Her residual AHI is 1.9 on 4 cm of water with EPR 3.  The patient states that she is scheduled to have knee replacement for the right knee.  Last year she had knee replacement on the left side.  She states due to ongoing knee pain most the time she gets up out of bed and sleeps in a recliner.  Reports that while she is in the recliner she does not use the CPAP.  REVIEW OF SYSTEMS: Out of a complete 14 system review of symptoms, the patient complains only of the following symptoms, and all other reviewed systems are negative.  ESS 4  ALLERGIES: Allergies  Allergen Reactions   Bactrim Itching, Nausea And Vomiting and Rash    Bactrim brand name. Redness.    HOME MEDICATIONS: Outpatient Medications Prior to Visit  Medication Sig Dispense Refill   ALPRAZolam (XANAX) 1 MG tablet Take 1 tablet (1 mg total) by mouth 3 (three) times daily as needed for anxiety. 270 tablet 1   ARIPiprazole (ABILIFY) 10 MG tablet Take 1 tablet (10 mg total) by mouth at bedtime. 90 tablet 2   ascorbic acid (VITAMIN C) 500 MG tablet Take 500 mg by mouth daily.     Blood Glucose Monitoring Suppl (FREESTYLE LITE) DEVI Use to measure glucose 4 times a day 1 each 0   CALCIUM-VITAMIN D-VITAMIN K PO Take 1 tablet by mouth daily.     Continuous Blood Gluc Receiver (FREESTYLE LIBRE 2 READER) DEVI As directed 1 each 0   Continuous Blood Gluc Sensor (FREESTYLE LIBRE 2 SENSOR) MISC 1 Piece by Does not apply route  every 14 (fourteen) days. 2 each 3   cycloSPORINE (RESTASIS) 0.05 % ophthalmic emulsion Place 2 drops into both eyes 2 (two) times daily.      diclofenac (VOLTAREN) 75 MG EC tablet Take 75 mg by mouth 2 (two) times daily.     escitalopram (LEXAPRO) 20 MG tablet Take 1 tablet (20 mg total) by mouth daily. 90 tablet 2   fluticasone (FLONASE) 50 MCG/ACT nasal spray Place 2 sprays into the nose daily as needed for allergies.      gabapentin (NEURONTIN) 100 MG capsule Take 1 capsule (100 mg total) by mouth 2 (two) times daily. 180 capsule 0   glucose blood test strip Test 4 times a day, she has freestyle lite meter 150 each 2   HYDROcodone-acetaminophen (NORCO/VICODIN) 5-325 MG tablet Take 1 tablet by mouth every 6 (six) hours as needed for pain.     Insulin Pen Needle (B-D ULTRAFINE III SHORT PEN) 31G X 8 MM MISC 1 each by Does not apply route as directed. 100 each 3   loratadine (CLARITIN) 10 MG tablet Take 10 mg by mouth daily.     meclizine (ANTIVERT) 25 MG tablet Take 25 mg by mouth 2 (two) times daily as needed for dizziness.   2   Menthol, Topical Analgesic, (ICY HOT EX) Apply 1 application topically  4 (four) times daily as needed (joint pain.).     methocarbamol (ROBAXIN) 500 MG tablet Take 1 tablet (500 mg total) by mouth every 8 (eight) hours as needed for muscle spasms. 40 tablet 1   Multiple Vitamin (MULTIVITAMIN WITH MINERALS) TABS tablet Take 1 tablet by mouth daily.     ondansetron (ZOFRAN) 4 MG tablet Take 1 tablet (4 mg total) by mouth every 8 (eight) hours as needed for nausea, vomiting or refractory nausea / vomiting. 30 tablet 1   oxyCODONE-acetaminophen (PERCOCET) 5-325 MG tablet Take 1 tablet by mouth every 4 (four) hours as needed for severe pain. 30 tablet 0   pantoprazole (PROTONIX) 40 MG tablet Take 1 tablet (40 mg total) by mouth 2 (two) times daily. (Patient taking differently: Take 40 mg by mouth daily before breakfast.) 180 tablet 3   Probiotic Product (PROBIOTIC PO) Take 1  tablet by mouth daily.     Propylene Glycol-Glycerin 0.6-0.6 % SOLN Place 1 drop into both eyes 2 (two) times daily.     rizatriptan (MAXALT-MLT) 10 MG disintegrating tablet Take 10 mg by mouth as needed for migraine. May repeat in 2 hours if needed     temazepam (RESTORIL) 30 MG capsule Take 1 capsule (30 mg total) by mouth at bedtime as needed for sleep. 90 capsule 2   TRULICITY 1.5 PT/4.6FK SOPN INJECT 1.5 MG UNDER THE SKIN ONCE A WEEK 2 mL 2   No facility-administered medications prior to visit.    PAST MEDICAL HISTORY: Past Medical History:  Diagnosis Date   Adenomatous polyp 2009   Anxiety    Arthritis    Bipolar 1 disorder ([place])    Constipation    Depression    Diabetes mellitus    Diabetes mellitus, type II ([place])    Diverticula of colon 2009   Generalized headaches    GERD (gastroesophageal reflux disease)    History of hiatal hernia    small   Hypertension    Migraine    Neuropathy    both feet   Obesity    Pelvic floor dysfunction    abnormal anorectal manometry at [facility] in 2009   Plantar fasciitis both feet   Sleep apnea    cpap setting of 3.5    PAST SURGICAL HISTORY: Past Surgical History:  Procedure Laterality Date   ABDOMINAL HYSTERECTOMY     compete   CATARACT EXTRACTION W/PHACO Right 02/15/2017   Procedure: CATARACT EXTRACTION PHACO AND INTRAOCULAR LENS PLACEMENT ([place]);  Surgeon: Rutherford Guys, MD;  Location: [facility];  Service: Ophthalmology;  Laterality: Right;  CDE: 4.18   CATARACT EXTRACTION W/PHACO Left 03/01/2017   Procedure: CATARACT EXTRACTION PHACO AND INTRAOCULAR LENS PLACEMENT ([facility]);  Surgeon: Rutherford Guys, MD;  Location: [facility];  Service: Ophthalmology;  Laterality: Left;  CDE: 2.56   COLON RESECTION    03/30/2002   with end-colostomy and Hartmann's pouch   COLON SURGERY     complicated diverticulitis requiring sigmoid resection with colostomy and subsequent takedown   COLONOSCOPY  12/11/2007   Dr. Gala Romney- marginal prep, normal rectum  pancolonic diverticula, adenomatous polyp   COLONOSCOPY  08/28/2003      Wide open colonic anastomosis/Scattered diverticula noted throughout colon/ Small external hemorrhoids   COLONOSCOPY  04/2012   [facility]: hyperplastic polyps, diverticulosis, ileocolonic anastomosis.   COLONOSCOPY WITH PROPOFOL N/A 06/07/2016   Procedure: COLONOSCOPY WITH PROPOFOL;  Surgeon: Daneil Dolin, MD;  Location: [facility];  Service: Endoscopy;  Laterality: N/A;  7:30 am  COLOSTOMY CLOSURE  07/10/2002   ESOPHAGOGASTRODUODENOSCOPY N/A 01/08/2020   Procedure: UPPER GASTROINTESTINAL ENDOSCOPY;  Surgeon: Alphonsa Overall, MD;  Location: [facility];  Service: General;  Laterality: N/A;   ESOPHAGOGASTRODUODENOSCOPY (EGD) WITH PROPOFOL N/A 08/23/2016   Procedure: ESOPHAGOGASTRODUODENOSCOPY (EGD) WITH PROPOFOL;  Surgeon: Alphonsa Overall, MD;  Location: [facility];  Service: General;  Laterality: N/A;   FLEXIBLE SIGMOIDOSCOPY  01/20/2012   [facility]: incomplete/attempted colonoscopy. Inadequate prep precluded examination   HEEL SPUR SURGERY Left 09/11/2013   both have been done   KNEE ARTHROSCOPY  02/04/2004    left knee/partial medial meniscectomy.   KNEE SURGERY     3 arthroscopic  2 on left 1 on right   LAPAROSCOPIC GASTRIC BANDING  2009   LAPAROSCOPIC GASTRIC SLEEVE RESECTION N/A 01/08/2020   Procedure: LAPAROSCOPIC SLEEVE GASTRECTOMY;  Surgeon: Alphonsa Overall, MD;  Location[facility];  Service: General;  Laterality: N/A;   LAPAROSCOPIC SALPINGOOPHERECTOMY  03/30/2002   TOTAL KNEE ARTHROPLASTY Left 03/27/2021   Procedure: TOTAL KNEE ARTHROPLASTY;  Surgeon: Netta Cedars, MD;  Location: [facility];  Service: Orthopedics;  Laterality: Left;   TUBAL LIGATION      FAMILY HISTORY: Family History  Problem Relation Age of Onset   Ovarian cancer Mother    Heart disease Mother    Anxiety disorder Mother    Cirrhosis Father        deceased age 32   Alcohol abuse Father    Liver cancer Cousin        age 31, deceased   Drug abuse Cousin     ADD / ADHD Son    Anxiety disorder Sister    Dementia Maternal Grandfather    Colon cancer Other        aunt, deceased age 16   Breast cancer Other        aunt, deceased age 1   Bipolar disorder Neg Hx    Depression Neg Hx    OCD Neg Hx    Paranoid behavior Neg Hx    Schizophrenia Neg Hx    Seizures Neg Hx    Sexual abuse Neg Hx    Physical abuse Neg Hx     SOCIAL HISTORY: Social History   Socioeconomic History   Marital status: Married    Spouse name: Not on file   Number of children: 2   Years of education: college   Highest education level: Not on file  Occupational History   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    Employer: DISABLED  Tobacco Use   Smoking status: Never   Smokeless tobacco: Never  Vaping Use   Vaping Use: Never used  Substance and Sexual Activity   Alcohol use: Not Currently   Drug use: Never   Sexual activity: Not Currently    Birth control/protection: Surgical  Other Topics Concern   Not on file  Social History Narrative   Not on file   Social Determinants of Health   Financial Resource Strain: Not on file  Food Insecurity: Not on file  Transportation Needs: Not on file  Physical Activity: Not on file  Stress: Not on file  Social Connections: Not on file  Intimate Partner Violence: Not on file      PHYSICAL EXAM  Vitals:   06/23/21 1039  BP: (!) 158/88  Pulse: 68  Weight: 262 lb 12.8 oz (119.2 kg)  Height: 5\' 6"  (1.676 m)   Body mass index is 42.42 kg/m.  Generalized: Well developed, in no acute  distress  Chest: Lungs clear to auscultation bilaterally  Neurological examination  Mentation: Alert oriented to time, place, history taking. Follows all commands speech and language fluent Cranial nerve II-XII: Extraocular movements were full, visual field were full on confrontational test Head turning and shoulder shrug  were normal and symmetric. Motor: The motor testing reveals 5 over 5 strength of all 4  extremities. Good symmetric motor tone is noted throughout.  Sensory: Sensory testing is intact to soft touch on all 4 extremities. No evidence of extinction is noted.  Gait and station: Has a brace on the right knee   DIAGNOSTIC DATA (LABS, IMAGING, TESTING) - I reviewed patient records, labs, notes, testing and imaging myself where available.  Lab Results  Component Value Date   WBC 10.4 03/29/2021   HGB 12.3 03/29/2021   HCT 37.5 03/29/2021   MCV 87.8 03/29/2021   PLT 167 03/29/2021      Component Value Date/Time   NA 140 06/15/2021 1026   K 4.3 06/15/2021 1026   CL 101 06/15/2021 1026   CO2 25 06/15/2021 1026   GLUCOSE 99 06/15/2021 1026   GLUCOSE 196 (H) 03/28/2021 0322   BUN 16 06/15/2021 1026   CREATININE 0.74 06/15/2021 1026   CREATININE 0.74 03/23/2011 0400   CALCIUM 9.5 06/15/2021 1026   PROT 7.1 06/15/2021 1026   ALBUMIN 4.4 06/15/2021 1026   AST 16 06/15/2021 1026   ALT 12 06/15/2021 1026   ALKPHOS 109 06/15/2021 1026   BILITOT 0.3 06/15/2021 1026   GFRNONAA >60 03/28/2021 0322   GFRAA >60 01/01/2020 1356   Lab Results  Component Value Date   CHOL 186 06/15/2021   HDL 54 06/15/2021   LDLCALC 105 (H) 06/15/2021   TRIG 157 (H) 06/15/2021   CHOLHDL 3.4 06/15/2021   Lab Results  Component Value Date   HGBA1C 6.5 (H) 03/17/2021   Lab Results  Component Value Date   VITAMINB12 571 03/05/2013   Lab Results  Component Value Date   TSH 1.060 06/15/2021      ASSESSMENT AND PLAN 61 y.o. year old female  has a past medical history of Adenomatous polyp (2009), Anxiety, Arthritis, Bipolar 1 disorder ([place]), Constipation, Depression, Diabetes mellitus, Diabetes mellitus, type II ([place]), Diverticula of colon (2009), Generalized headaches, GERD (gastroesophageal reflux disease), History of hiatal hernia, Hypertension, Migraine, Neuropathy, Obesity, Pelvic floor dysfunction, Plantar fasciitis (both feet), and Sleep apnea. here with:  OSA on CPAP  - CPAP  compliance suboptimal - Good treatment of AHI  - Encourage patient to use CPAP nightly and > 4 hours each night - F/U in 1 year or sooner if needed    Ward Givens, MSN, NP-C 06/23/2021, 11:11 AM [facility] [address] [place], [place] [address] [phone]

## 2021-06-24 ENCOUNTER — Telehealth (HOSPITAL_COMMUNITY): Payer: Self-pay | Admitting: Psychiatry

## 2021-06-24 NOTE — Telephone Encounter (Signed)
Called to schedule f/u appt left vm 

## 2021-06-26 NOTE — Patient Instructions (Addendum)
DUE TO COVID-19 ONLY ONE VISITOR IS ALLOWED TO COME WITH YOU AND STAY IN THE WAITING ROOM ONLY DURING PRE OP AND PROCEDURE DAY OF SURGERY.   Up to two visitors ages 16+ are allowed at one time in a patient's room.  The visitors may rotate out with other people throughout the day.  Additionally, up to two children between the ages of 15 and 52 are allowed and do not count toward the number of allowed visitors.  Children within this age range must be accompanied by an adult visitor.  One adult visitor may remain with the patient overnight and must be in the room by 8 PM.  YOU NEED TO HAVE A COVID 19 TEST ON__3-1-23 @ _____ THIS TEST MUST BE DONE BEFORE SURGERY,     [name] COME IN THROUGH MAIN ENTRANCE BE SEATED INT THE LOBBY AREA TO THE RIGHT AS YOU COME IN THE MAIN ENTRANCE DIAL [phone] GIVE THEM YOUR NAME AND LET THEM KNOW YOU ARE HERE FOR COVID TESTING    ONCE YOUR COVID TEST IS COMPLETED,  PLEASE Wear a mask when in [place]           Your procedure is scheduled on: 07-10-21    Report to [facility] Main  Entrance   Report to admitting at        1205 PM     Call this number if you have problems the morning of surgery [phone]   Remember: Do not eat food :After Midnight.  You may have clear liquids until 1150 am the day of your surgery then nothing by mouth    CLEAR LIQUID DIET                                                                    water Black Coffee and tea, regular and decaf No Creamer                            Plain Jell-O any favor except red or purple                                  Fruit ices (not with fruit pulp)                                      Iced Popsicles                                     Carbonated beverages, regular and diet                                    Cranberry, grape and apple juices Sports drinks like Gatorade Lightly seasoned clear broth or consume(fat  free) Sugar, honey syrup  _____________________________________________________________________      BRUSH YOUR TEETH MORNING OF SURGERY AND RINSE YOUR MOUTH OUT, NO  CHEWING GUM CANDY OR MINTS.     Take these medicines the morning of surgery with A SIP OF WATER: Pantoprazole loratadine,hydrocodone if needed, gabapentin, escitalopram, eye drops, xanax if needed  DO NOT TAKE ANY DIABETIC MEDICATIONS DAY OF YOUR SURGERY                               You may not have any metal on your body including hair pins and              piercings  Do not wear jewelry, make-up, lotions, powders,perfumes,        deodorant             Do not wear nail polish on your fingernails or toenails .  Do not shave  48 hours prior to surgery.           Do not bring valuables to the hospital. [place].  Contacts, dentures or bridgework may not be worn into surgery.  You may bring a small overnight bag with you     Patients discharged the day of surgery will not be allowed to drive home. IF YOU ARE HAVING SURGERY AND GOING HOME THE SAME DAY, YOU MUST HAVE AN ADULT TO DRIVE YOU HOME AND BE WITH YOU FOR 24 HOURS. YOU MAY GO HOME BY TAXI OR UBER OR ORTHERWISE, BUT AN ADULT MUST ACCOMPANY YOU HOME AND STAY WITH YOU FOR 24 HOURS.  Name and phone number of your driver:  Special Instructions: N/A              Please read over the following fact sheets you were given: _____________________________________________________________________             [facility] - Preparing for Surgery Before surgery, you can play an important role.  Because skin is not sterile, your skin needs to be as free of germs as possible.  You can reduce the number of germs on your skin by washing with CHG (chlorahexidine gluconate) soap before surgery.  CHG is an antiseptic cleaner which kills germs and bonds with the skin to continue killing germs even after washing. Please DO NOT use if you  have an allergy to CHG or antibacterial soaps.  If your skin becomes reddened/irritated stop using the CHG and inform your nurse when you arrive at Short Stay. Do not shave (including legs and underarms) for at least 48 hours prior to the first CHG shower.  You may shave your face/neck. Please follow these instructions carefully:  1.  Shower with CHG Soap the night before surgery and the  morning of Surgery.  2.  If you choose to wash your hair, wash your hair first as usual with your  normal  shampoo.  3.  After you shampoo, rinse your hair and body thoroughly to remove the  shampoo.                           4.  Use CHG as you would any other liquid soap.  You can apply chg directly  to the skin and wash                       Gently with a scrungie or clean washcloth.  5.  Apply the CHG  Soap to your body ONLY FROM THE NECK DOWN.   Do not use on face/ open                           Wound or open sores. Avoid contact with eyes, ears mouth and genitals (private parts).                       Wash face,  Genitals (private parts) with your normal soap.             6.  Wash thoroughly, paying special attention to the area where your surgery  will be performed.  7.  Thoroughly rinse your body with warm water from the neck down.  8.  DO NOT shower/wash with your normal soap after using and rinsing off  the CHG Soap.                9.  Pat yourself dry with a clean towel.            10.  Wear clean pajamas.            11.  Place clean sheets on your bed the night of your first shower and do not  sleep with pets. Day of Surgery : Do not apply any lotions/deodorants the morning of surgery.  Please wear clean clothes to the hospital/surgery center.  FAILURE TO FOLLOW THESE INSTRUCTIONS MAY RESULT IN THE CANCELLATION OF YOUR SURGERY PATIENT SIGNATURE_________________________________  NURSE  SIGNATURE__________________________________  ________________________________________________________________________    [name]  An incentive spirometer is a tool that can help keep your lungs clear and active. This tool measures how well you are filling your lungs with each breath. Taking long deep breaths may help reverse or decrease the chance of developing breathing (pulmonary) problems (especially infection) following: A long period of time when you are unable to move or be active. BEFORE THE PROCEDURE  If the spirometer includes an indicator to show your best effort, your nurse or respiratory therapist will set it to a desired goal. If possible, sit up straight or lean slightly forward. Try not to slouch. Hold the incentive spirometer in an upright position. INSTRUCTIONS FOR USE  Sit on the edge of your bed if possible, or sit up as far as you can in bed or on a chair. Hold the incentive spirometer in an upright position. Breathe out normally. Place the mouthpiece in your mouth and seal your lips tightly around it. Breathe in slowly and as deeply as possible, raising the piston or the ball toward the top of the column. Hold your breath for 3-5 seconds or for as long as possible. Allow the piston or ball to fall to the bottom of the column. Remove the mouthpiece from your mouth and breathe out normally. Rest for a few seconds and repeat Steps 1 through 7 at least 10 times every 1-2 hours when you are awake. Take your time and take a few normal breaths between deep breaths. The spirometer may include an indicator to show your best effort. Use the indicator as a goal to work toward during each repetition. After each set of 10 deep breaths, practice coughing to be sure your lungs are clear. If you have an incision (the cut made at the time of surgery), support your incision when coughing by placing a pillow or rolled up towels firmly against it. Once you are able to get out of  bed,  walk around indoors and cough well. You may stop using the incentive spirometer when instructed by your caregiver.  RISKS AND COMPLICATIONS Take your time so you do not get dizzy or light-headed. If you are in pain, you may need to take or ask for pain medication before doing incentive spirometry. It is harder to take a deep breath if you are having pain. AFTER USE Rest and breathe slowly and easily. It can be helpful to keep track of a log of your progress. Your caregiver can provide you with a simple table to help with this. If you are using the spirometer at home, follow these instructions: [place] IF:  You are having difficultly using the spirometer. You have trouble using the spirometer as often as instructed. Your pain medication is not giving enough relief while using the spirometer. You develop fever of 100.5 F (38.1 C) or higher. SEEK IMMEDIATE MEDICAL CARE IF:  You cough up bloody sputum that had not been present before. You develop fever of 102 F (38.9 C) or greater. You develop worsening pain at or near the incision site. MAKE SURE YOU:  Understand these instructions. Will watch your condition. Will get help right away if you are not doing well or get worse. Document Released: 09/06/2006 Document Revised: 07/19/2011 Document Reviewed: 11/07/2006 [facility] Patient Information 2014 [place], [place].   ________________________________________________________________________

## 2021-06-26 NOTE — Progress Notes (Addendum)
PCP - Sharilyn Sites [facility] Cardiologist - no  PPM/ICD -  Device Orders -  Rep Notified -   Chest x-ray -  EKG - 03-17-21 Stress Test -  ECHO -  Cardiac Cath -  CMP 06-15-21 epic  Sleep Study -  CPAP -   Fasting Blood Sugar - 127 Checks Blood Sugar __4___ times a day  Blood Thinner Instructions: Aspirin Instructions:  ERAS Protcol - PRE-SURGERY Ensure or G2-   COVID TEST- 07-08-21 COVID vaccine -x3 Moderna  Activity-- Anesthesia review: DM HTN, OSA  Patient denies shortness of breath, fever, cough and chest pain at PAT appointment   All instructions explained to the patient, with a verbal understanding of the material. Patient agrees to go over the instructions while at home for a better understanding. Patient also instructed to self quarantine after being tested for COVID-19. The opportunity to ask questions was provided.

## 2021-06-29 ENCOUNTER — Other Ambulatory Visit: Payer: Self-pay

## 2021-06-29 ENCOUNTER — Encounter (HOSPITAL_COMMUNITY)
Admission: RE | Admit: 2021-06-29 | Discharge: 2021-06-29 | Disposition: A | Discharge status: Home / Self Care | Payer: Medicare Other | Source: Ambulatory Visit | Attending: Orthopedic Surgery | Admitting: Orthopedic Surgery

## 2021-06-29 ENCOUNTER — Encounter (HOSPITAL_COMMUNITY): Payer: Self-pay

## 2021-06-29 VITALS — BP 165/95 | HR 65 | Temp 98.0°F | Ht 66.0 in | Wt 255.0 lb

## 2021-06-29 DIAGNOSIS — I10 Essential (primary) hypertension: Secondary | ICD-10-CM | POA: Diagnosis not present

## 2021-06-29 DIAGNOSIS — Z20822 Contact with and (suspected) exposure to covid-19: Secondary | ICD-10-CM | POA: Diagnosis not present

## 2021-06-29 DIAGNOSIS — Z01812 Encounter for preprocedural laboratory examination: Secondary | ICD-10-CM | POA: Diagnosis not present

## 2021-06-29 DIAGNOSIS — E1165 Type 2 diabetes mellitus with hyperglycemia: Secondary | ICD-10-CM | POA: Insufficient documentation

## 2021-06-29 DIAGNOSIS — Z01818 Encounter for other preprocedural examination: Secondary | ICD-10-CM

## 2021-06-29 LAB — SURGICAL PCR SCREEN
MRSA, PCR: NEGATIVE
Staphylococcus aureus: NEGATIVE

## 2021-06-29 LAB — CBC
HCT: 49.1 % — ABNORMAL HIGH (ref 36.0–46.0)
Hemoglobin: 16.1 g/dL — ABNORMAL HIGH (ref 12.0–15.0)
MCH: 28.9 pg (ref 26.0–34.0)
MCHC: 32.8 g/dL (ref 30.0–36.0)
MCV: 88.2 fL (ref 80.0–100.0)
Platelets: 248 10*3/uL (ref 150–400)
RBC: 5.57 MIL/uL — ABNORMAL HIGH (ref 3.87–5.11)
RDW: 13.3 % (ref 11.5–15.5)
WBC: 8.9 10*3/uL (ref 4.0–10.5)
nRBC: 0 % (ref 0.0–0.2)

## 2021-06-29 LAB — GLUCOSE, CAPILLARY: Glucose-Capillary: 120 mg/dL — ABNORMAL HIGH (ref 70–99)

## 2021-06-30 LAB — HEMOGLOBIN A1C
Hgb A1c MFr Bld: 6.5 % — ABNORMAL HIGH (ref 4.8–5.6)
Mean Plasma Glucose: 140 mg/dL

## 2021-06-30 NOTE — H&P (Signed)
Patient's anticipated LOS is less than 2 midnights, meeting these requirements: - Younger than 38 - Lives within 1 hour of care - Has a competent adult at home to recover with post-op recover - NO history of  - Chronic pain requiring opiods  - Diabetes  - Coronary Artery Disease  - Heart failure  - Heart attack  - Stroke  - DVT/VTE  - Cardiac arrhythmia  - Respiratory Failure/COPD  - Renal failure  - Anemia  - Advanced Liver disease     [name] is an 61 y.o. female.    Chief Complaint: right knee pain  HPI: Pt is a 61 y.o. female complaining of right knee pain for multiple years. Pain had continually increased since the beginning. X-rays in the clinic show end-stage arthritic changes of the right knee. Pt has tried various conservative treatments which have failed to alleviate their symptoms, including injections and therapy. Various options are discussed with the patient. Risks, benefits and expectations were discussed with the patient. Patient understand the risks, benefits and expectations and wishes to proceed with surgery.   PCP:  Sharilyn Sites, MD  D/C Plans: Home  PMH: Past Medical History:  Diagnosis Date   Adenomatous polyp 2009   Anxiety    Arthritis    Bipolar 1 disorder ([place])    Constipation    Depression    Diabetes mellitus    Diabetes mellitus, type II ([place])    Diverticula of colon 2009   Generalized headaches    GERD (gastroesophageal reflux disease)    History of hiatal hernia    small   Hypertension    no meds   Migraine    Neuropathy    both feet   Obesity    Pelvic floor dysfunction    abnormal anorectal manometry at [facility] in 2009   Plantar fasciitis both feet   Sleep apnea    cpap setting of 3.5    PSH: Past Surgical History:  Procedure Laterality Date   ABDOMINAL HYSTERECTOMY     compete   CATARACT EXTRACTION W/PHACO Right 02/15/2017   Procedure: CATARACT EXTRACTION PHACO AND INTRAOCULAR LENS PLACEMENT ([place]);  Surgeon:  Rutherford Guys, MD;  Location: [facility];  Service: Ophthalmology;  Laterality: Right;  CDE: 4.18   CATARACT EXTRACTION W/PHACO Left 03/01/2017   Procedure: CATARACT EXTRACTION PHACO AND INTRAOCULAR LENS PLACEMENT ([facility]);  Surgeon: Rutherford Guys, MD;  Location: [facility];  Service: Ophthalmology;  Laterality: Left;  CDE: 2.56   COLON RESECTION    03/30/2002   with end-colostomy and Hartmann's pouch   COLON SURGERY     complicated diverticulitis requiring sigmoid resection with colostomy and subsequent takedown   COLONOSCOPY  12/11/2007   Dr. Gala Romney- marginal prep, normal rectum pancolonic diverticula, adenomatous polyp   COLONOSCOPY  08/28/2003      Wide open colonic anastomosis/Scattered diverticula noted throughout colon/ Small external hemorrhoids   COLONOSCOPY  04/2012   [facility]: hyperplastic polyps, diverticulosis, ileocolonic anastomosis.   COLONOSCOPY WITH PROPOFOL N/A 06/07/2016   Procedure: COLONOSCOPY WITH PROPOFOL;  Surgeon: Daneil Dolin, MD;  Location: [facility];  Service: Endoscopy;  Laterality: N/A;  7:30 am   COLOSTOMY CLOSURE  07/10/2002   ESOPHAGOGASTRODUODENOSCOPY N/A 01/08/2020   Procedure: UPPER GASTROINTESTINAL ENDOSCOPY;  Surgeon: Alphonsa Overall, MD;  Location: [facility];  Service: General;  Laterality: N/A;   ESOPHAGOGASTRODUODENOSCOPY (EGD) WITH PROPOFOL N/A 08/23/2016   Procedure: ESOPHAGOGASTRODUODENOSCOPY (EGD) WITH PROPOFOL;  Surgeon: Alphonsa Overall, MD;  Location: [facility];  Service: General;  Laterality: N/A;   FLEXIBLE SIGMOIDOSCOPY  01/20/2012   RMR: incomplete/attempted colonoscopy. Inadequate prep precluded examination   HEEL SPUR SURGERY Left 09/11/2013   both have been done   KNEE ARTHROSCOPY  02/04/2004    left knee/partial medial meniscectomy.   KNEE SURGERY     3 arthroscopic  2 on left 1 on right   LAPAROSCOPIC GASTRIC BANDING  2009   LAPAROSCOPIC GASTRIC SLEEVE RESECTION N/A 01/08/2020   Procedure: LAPAROSCOPIC SLEEVE GASTRECTOMY;  Surgeon: Alphonsa Overall, MD;   Location: [facility];  Service: General;  Laterality: N/A;   LAPAROSCOPIC SALPINGOOPHERECTOMY  03/30/2002   TOTAL KNEE ARTHROPLASTY Left 03/27/2021   Procedure: TOTAL KNEE ARTHROPLASTY;  Surgeon: Netta Cedars, MD;  Location: [facility];  Service: Orthopedics;  Laterality: Left;   TUBAL LIGATION      Social History:  reports that she has never smoked. She has never used smokeless tobacco. She reports that she does not currently use alcohol. She reports that she does not use drugs.  Allergies:  Allergies  Allergen Reactions   Bactrim Itching, Nausea And Vomiting and Rash    Bactrim brand name. Redness.    Medications: No current facility-administered medications for this encounter.   Current Outpatient Medications  Medication Sig Dispense Refill   ALPRAZolam (XANAX) 1 MG tablet Take 1 tablet (1 mg total) by mouth 3 (three) times daily as needed for anxiety. 270 tablet 1   ARIPiprazole (ABILIFY) 10 MG tablet Take 1 tablet (10 mg total) by mouth at bedtime. 90 tablet 2   ascorbic acid (VITAMIN C) 500 MG tablet Take 500 mg by mouth daily.     CALCIUM-VITAMIN D-VITAMIN K PO Take 1 tablet by mouth daily. Chewable     cycloSPORINE (RESTASIS) 0.05 % ophthalmic emulsion Place 2 drops into both eyes 2 (two) times daily.      diclofenac (VOLTAREN) 75 MG EC tablet Take 75 mg by mouth 2 (two) times daily.     escitalopram (LEXAPRO) 20 MG tablet Take 1 tablet (20 mg total) by mouth daily. 90 tablet 2   fluticasone (FLONASE) 50 MCG/ACT nasal spray Place 2 sprays into the nose daily as needed for allergies.      gabapentin (NEURONTIN) 100 MG capsule Take 1 capsule (100 mg total) by mouth 2 (two) times daily. 180 capsule 0   GLUCOSAMINE HCL PO Take 1 tablet by mouth daily.     HYDROcodone-acetaminophen (NORCO/VICODIN) 5-325 MG tablet Take 1 tablet by mouth every 6 (six) hours as needed for pain.     linaclotide (LINZESS) 145 MCG CAPS capsule Take 145 mcg by mouth daily before breakfast.     loratadine  (CLARITIN) 10 MG tablet Take 10 mg by mouth daily.     meclizine (ANTIVERT) 25 MG tablet Take 25 mg by mouth 2 (two) times daily as needed for dizziness.   2   Menthol, Topical Analgesic, (ICY HOT EX) Apply 1 application topically 4 (four) times daily as needed (joint pain.).     methocarbamol (ROBAXIN) 500 MG tablet Take 1 tablet (500 mg total) by mouth every 8 (eight) hours as needed for muscle spasms. 40 tablet 1   Multiple Vitamin (MULTIVITAMIN WITH MINERALS) TABS tablet Take 1 tablet by mouth daily. celebrate multivitamin 2 in 1     ondansetron (ZOFRAN) 4 MG tablet Take 1 tablet (4 mg total) by mouth every 8 (eight) hours as needed for nausea, vomiting or refractory nausea / vomiting. 30 tablet 1   pantoprazole (PROTONIX) 40 MG tablet Take  1 tablet (40 mg total) by mouth 2 (two) times daily. (Patient taking differently: Take 40 mg by mouth daily before breakfast.) 180 tablet 3   Probiotic Product (PROBIOTIC PO) Take 1 tablet by mouth daily.     Propylene Glycol-Glycerin 0.6-0.6 % SOLN Place 1 drop into both eyes 2 (two) times daily.     rizatriptan (MAXALT-MLT) 10 MG disintegrating tablet Take 10 mg by mouth as needed for migraine. May repeat in 2 hours if needed     temazepam (RESTORIL) 30 MG capsule Take 1 capsule (30 mg total) by mouth at bedtime as needed for sleep. 90 capsule 2   TRULICITY 1.5 UJ/8.1XB SOPN INJECT 1.5 MG UNDER THE SKIN ONCE A WEEK 2 mL 2   Blood Glucose Monitoring Suppl (FREESTYLE LITE) DEVI Use to measure glucose 4 times a day 1 each 0   Continuous Blood Gluc Receiver (FREESTYLE LIBRE 2 READER) DEVI As directed 1 each 0   Continuous Blood Gluc Sensor (FREESTYLE LIBRE 2 SENSOR) MISC 1 Piece by Does not apply route every 14 (fourteen) days. 2 each 3   glucose blood test strip Test 4 times a day, she has freestyle lite meter 150 each 2   Insulin Pen Needle (B-D ULTRAFINE III SHORT PEN) 31G X 8 MM MISC 1 each by Does not apply route as directed. 100 each 3    Results for  orders placed or performed during the hospital encounter of 06/29/21 (from the past 48 hour(s))  Glucose, capillary     Status: Abnormal   Collection Time: 06/29/21  2:24 PM  Result Value Ref Range   Glucose-Capillary 120 (H) 70 - 99 mg/dL    Comment: Glucose reference range applies only to samples taken after fasting for at least 8 hours.  Surgical PCR Screen     Status: None   Collection Time: 06/29/21  2:33 PM   Specimen: Nasal Mucosa; Nasal Swab  Result Value Ref Range   MRSA, PCR NEGATIVE NEGATIVE   Staphylococcus aureus NEGATIVE NEGATIVE    Comment: (NOTE) The Xpert SA Assay (FDA approved for NASAL specimens in patients 69 years of age and older), is one component of a comprehensive surveillance program. It is not intended to diagnose infection nor to guide or monitor treatment. Performed at [facility], [place] [address], [place], [place] [address]   Hemoglobin A1c per protocol     Status: Abnormal   Collection Time: 06/29/21  2:33 PM  Result Value Ref Range   Hgb A1c MFr Bld 6.5 (H) 4.8 - 5.6 %    Comment: (NOTE)         Prediabetes: 5.7 - 6.4         Diabetes: >6.4         Glycemic control for adults with diabetes: <7.0    Mean Plasma Glucose 140 mg/dL    Comment: (NOTE) Performed At: [facility] [place], [place] [id number] Rush Farmer MD [id number]   CBC per protocol     Status: Abnormal   Collection Time: 06/29/21  2:33 PM  Result Value Ref Range   WBC 8.9 4.0 - 10.5 K/uL   RBC 5.57 (H) 3.87 - 5.11 MIL/uL   Hemoglobin 16.1 (H) 12.0 - 15.0 g/dL   HCT 49.1 (H) 36.0 - 46.0 %   MCV 88.2 80.0 - 100.0 fL   MCH 28.9 26.0 - 34.0 pg   MCHC 32.8 30.0 - 36.0 g/dL   RDW 13.3 11.5 - 15.5 %  Platelets 248 150 - 400 K/uL   nRBC 0.0 0.0 - 0.2 %    Comment: Performed at [facility], [place] [address] [place], [place] [address]   No results found.  ROS: Pain with rom of the right lower  extremity  Physical Exam: Alert and oriented 61 y.o. female in no acute distress Cranial nerves 2-12 intact Cervical spine: full rom with no tenderness, nv intact distally Chest: active breath sounds bilaterally, no wheeze rhonchi or rales Heart: regular rate and rhythm, no murmur Abd: non tender non distended with active bowel sounds Hip is stable with rom  Right knee painful and stiff rom Nv intact distally No rashes or edema distally  Assessment/Plan Assessment: right knee end stage osteoarthritis  Plan:  Patient will undergo a right total knee by Dr. Veverly Fells at [place] Risks benefits and expectations were discussed with the patient. Patient understand risks, benefits and expectations and wishes to proceed. Preoperative templating of the joint replacement has been completed, documented, and submitted to the Operating Room personnel in order to optimize intra-operative equipment management.   Merla Riches PA-C, MPAS [facility] is now [facility] [employer] [address], [place], [place], [place] [address] Phone: [phone] [email or web address]   [employer]

## 2021-07-02 ENCOUNTER — Other Ambulatory Visit: Payer: Self-pay

## 2021-07-02 ENCOUNTER — Encounter: Payer: Self-pay | Admitting: "Endocrinology

## 2021-07-02 ENCOUNTER — Ambulatory Visit (INDEPENDENT_AMBULATORY_CARE_PROVIDER_SITE_OTHER): Payer: Medicare Other | Admitting: "Endocrinology

## 2021-07-02 VITALS — Ht 66.0 in | Wt 261.4 lb

## 2021-07-02 DIAGNOSIS — I10 Essential (primary) hypertension: Secondary | ICD-10-CM

## 2021-07-02 DIAGNOSIS — E1165 Type 2 diabetes mellitus with hyperglycemia: Secondary | ICD-10-CM | POA: Diagnosis not present

## 2021-07-02 DIAGNOSIS — E559 Vitamin D deficiency, unspecified: Secondary | ICD-10-CM | POA: Diagnosis not present

## 2021-07-02 DIAGNOSIS — E782 Mixed hyperlipidemia: Secondary | ICD-10-CM

## 2021-07-02 MED ORDER — TRULICITY 1.5 MG/0.5ML ~~LOC~~ SOAJ: SUBCUTANEOUS | 1 refills | Status: DC

## 2021-07-02 NOTE — Patient Instructions (Signed)

## 2021-07-02 NOTE — Progress Notes (Signed)
07/02/2021, 8:28 PM                                     Endocrinology follow-up note   Subjective:    Patient ID: ALLAINA BROTZMAN, female    DOB: 12-24-1960.  BEONKA AMESQUITA is being seen in follow up after she was seen in consultation for management of currently uncontrolled symptomatic diabetes requested by  Sharilyn Sites, MD.   Past Medical History:  Diagnosis Date   Adenomatous polyp 2009   Anxiety    Arthritis    Bipolar 1 disorder ([place])    Constipation    Depression    Diabetes mellitus    Diabetes mellitus, type II ([place])    Diverticula of colon 2009   Generalized headaches    GERD (gastroesophageal reflux disease)    History of hiatal hernia    small   Hypertension    no meds   Migraine    Neuropathy    both feet   Obesity    Pelvic floor dysfunction    abnormal anorectal manometry at [facility] in 2009   Plantar fasciitis both feet   Sleep apnea    cpap setting of 3.5    Past Surgical History:  Procedure Laterality Date   ABDOMINAL HYSTERECTOMY     compete   CATARACT EXTRACTION W/PHACO Right 02/15/2017   Procedure: CATARACT EXTRACTION PHACO AND INTRAOCULAR LENS PLACEMENT ([place]);  Surgeon: Rutherford Guys, MD;  Location: [facility];  Service: Ophthalmology;  Laterality: Right;  CDE: 4.18   CATARACT EXTRACTION W/PHACO Left 03/01/2017   Procedure: CATARACT EXTRACTION PHACO AND INTRAOCULAR LENS PLACEMENT ([facility]);  Surgeon: Rutherford Guys, MD;  Location: [facility];  Service: Ophthalmology;  Laterality: Left;  CDE: 2.56   COLON RESECTION    03/30/2002   with end-colostomy and Hartmann's pouch   COLON SURGERY     complicated diverticulitis requiring sigmoid resection with colostomy and subsequent takedown   COLONOSCOPY  12/11/2007   Dr. Gala Romney- marginal prep, normal rectum pancolonic diverticula, adenomatous polyp   COLONOSCOPY  08/28/2003      Wide open colonic anastomosis/Scattered diverticula noted throughout colon/ Small external hemorrhoids    COLONOSCOPY  04/2012   [facility]: hyperplastic polyps, diverticulosis, ileocolonic anastomosis.   COLONOSCOPY WITH PROPOFOL N/A 06/07/2016   Procedure: COLONOSCOPY WITH PROPOFOL;  Surgeon: Daneil Dolin, MD;  Location: [facility];  Service: Endoscopy;  Laterality: N/A;  7:30 am   COLOSTOMY CLOSURE  07/10/2002   ESOPHAGOGASTRODUODENOSCOPY N/A 01/08/2020   Procedure: UPPER GASTROINTESTINAL ENDOSCOPY;  Surgeon: Alphonsa Overall, MD;  Location: [facility];  Service: General;  Laterality: N/A;   ESOPHAGOGASTRODUODENOSCOPY (EGD) WITH PROPOFOL N/A 08/23/2016   Procedure: ESOPHAGOGASTRODUODENOSCOPY (EGD) WITH PROPOFOL;  Surgeon: Alphonsa Overall, MD;  Location: [facility];  Service: General;  Laterality: N/A;   FLEXIBLE SIGMOIDOSCOPY  01/20/2012   [facility]: incomplete/attempted colonoscopy. Inadequate prep precluded examination   HEEL SPUR SURGERY Left 09/11/2013   both have been done   KNEE ARTHROSCOPY  02/04/2004    left knee/partial medial meniscectomy.   KNEE SURGERY     3 arthroscopic  2 on left 1 on right   LAPAROSCOPIC GASTRIC BANDING  2009   LAPAROSCOPIC GASTRIC SLEEVE RESECTION N/A 01/08/2020   Procedure: LAPAROSCOPIC SLEEVE GASTRECTOMY;  Surgeon: Alphonsa Overall, MD;  Location: [facility];  Service: General;  Laterality: N/A;   LAPAROSCOPIC SALPINGOOPHERECTOMY  03/30/2002  TOTAL KNEE ARTHROPLASTY Left 03/27/2021   Procedure: TOTAL KNEE ARTHROPLASTY;  Surgeon: Beverely Low, MD;  Location: [facility];  Service: Orthopedics;  Laterality: Left;   TUBAL LIGATION      Social History   Socioeconomic History   Marital status: Married    Spouse name: Not on file   Number of children: 2   Years of education: college   Highest education level: Not on file  Occupational History   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    Employer: DISABLED  Tobacco Use   Smoking status: Never   Smokeless tobacco: Never  Vaping Use   Vaping Use: Never used  Substance and Sexual Activity   Alcohol use: Not Currently    Drug use: Never   Sexual activity: Not Currently    Birth control/protection: Surgical  Other Topics Concern   Not on file  Social History Narrative   Not on file   Social Determinants of Health   Financial Resource Strain: Not on file  Food Insecurity: Not on file  Transportation Needs: Not on file  Physical Activity: Not on file  Stress: Not on file  Social Connections: Not on file    Family History  Problem Relation Age of Onset   Ovarian cancer Mother    Heart disease Mother    Anxiety disorder Mother    Cirrhosis Father        deceased age 89   Alcohol abuse Father    Liver cancer Cousin        age 1, deceased   Drug abuse Cousin    ADD / ADHD Son    Anxiety disorder Sister    Dementia Maternal Grandfather    Colon cancer Other        aunt, deceased age 38   Breast cancer Other        aunt, deceased age 49   Bipolar disorder Neg Hx    Depression Neg Hx    OCD Neg Hx    Paranoid behavior Neg Hx    Schizophrenia Neg Hx    Seizures Neg Hx    Sexual abuse Neg Hx    Physical abuse Neg Hx     Outpatient Encounter Medications as of 07/02/2021  Medication Sig   ALPRAZolam (XANAX) 1 MG tablet Take 1 tablet (1 mg total) by mouth 3 (three) times daily as needed for anxiety.   ARIPiprazole (ABILIFY) 10 MG tablet Take 1 tablet (10 mg total) by mouth at bedtime.   ascorbic acid (VITAMIN C) 500 MG tablet Take 500 mg by mouth daily.   Blood Glucose Monitoring Suppl (FREESTYLE LITE) DEVI Use to measure glucose 4 times a day   CALCIUM-VITAMIN D-VITAMIN K PO Take 1 tablet by mouth daily. Chewable   Continuous Blood Gluc Receiver (FREESTYLE LIBRE 2 READER) DEVI As directed   Continuous Blood Gluc Sensor (FREESTYLE LIBRE 2 SENSOR) MISC 1 Piece by Does not apply route every 14 (fourteen) days.   cycloSPORINE (RESTASIS) 0.05 % ophthalmic emulsion Place 2 drops into both eyes 2 (two) times daily.    diclofenac (VOLTAREN) 75 MG EC tablet Take 75 mg by mouth 2 (two) times daily.    Dulaglutide (TRULICITY) 1.5 MG/0.5ML SOPN INJECT 1.5 MG UNDER THE SKIN ONCE A WEEK   escitalopram (LEXAPRO) 20 MG tablet Take 1 tablet (20 mg total) by mouth daily.   fluticasone (FLONASE) 50 MCG/ACT nasal spray Place 2 sprays into the nose daily as needed for allergies.  gabapentin (NEURONTIN) 100 MG capsule Take 1 capsule (100 mg total) by mouth 2 (two) times daily.   GLUCOSAMINE HCL PO Take 1 tablet by mouth daily.   glucose blood test strip Test 4 times a day, she has freestyle lite meter   HYDROcodone-acetaminophen (NORCO/VICODIN) 5-325 MG tablet Take 1 tablet by mouth every 6 (six) hours as needed for pain.   Insulin Pen Needle (B-D ULTRAFINE III SHORT PEN) 31G X 8 MM MISC 1 each by Does not apply route as directed.   linaclotide (LINZESS) 145 MCG CAPS capsule Take 145 mcg by mouth daily before breakfast.   loratadine (CLARITIN) 10 MG tablet Take 10 mg by mouth daily.   meclizine (ANTIVERT) 25 MG tablet Take 25 mg by mouth 2 (two) times daily as needed for dizziness.    Menthol, Topical Analgesic, (ICY HOT EX) Apply 1 application topically 4 (four) times daily as needed (joint pain.).   methocarbamol (ROBAXIN) 500 MG tablet Take 1 tablet (500 mg total) by mouth every 8 (eight) hours as needed for muscle spasms.   Multiple Vitamin (MULTIVITAMIN WITH MINERALS) TABS tablet Take 1 tablet by mouth daily. celebrate multivitamin 2 in 1   ondansetron (ZOFRAN) 4 MG tablet Take 1 tablet (4 mg total) by mouth every 8 (eight) hours as needed for nausea, vomiting or refractory nausea / vomiting.   pantoprazole (PROTONIX) 40 MG tablet Take 1 tablet (40 mg total) by mouth 2 (two) times daily. (Patient taking differently: Take 40 mg by mouth daily before breakfast.)   Probiotic Product (PROBIOTIC PO) Take 1 tablet by mouth daily.   Propylene Glycol-Glycerin 0.6-0.6 % SOLN Place 1 drop into both eyes 2 (two) times daily.   rizatriptan (MAXALT-MLT) 10 MG disintegrating tablet Take 10 mg by mouth as needed  for migraine. May repeat in 2 hours if needed   temazepam (RESTORIL) 30 MG capsule Take 1 capsule (30 mg total) by mouth at bedtime as needed for sleep.   [DISCONTINUED] TRULICITY 1.5 NW/2.9FA SOPN INJECT 1.5 MG UNDER THE SKIN ONCE A WEEK   No facility-administered encounter medications on file as of 07/02/2021.    ALLERGIES: Allergies  Allergen Reactions   Bactrim Itching, Nausea And Vomiting and Rash    Bactrim brand name. Redness.    VACCINATION STATUS: Immunization History  Administered Date(s) Administered   Moderna Sars-Covid-2 Vaccination 07/21/2019, 08/22/2019   Pneumococcal Polysaccharide-23 01/09/2020    Diabetes She presents for her follow-up diabetic visit. She has type 2 diabetes mellitus. Onset time: She was diagnosed at approximate age of 55 years. Her disease course has been improving (She underwent sleeve gastrectomy on January 08, 2020, and she has lost 14 pounds since her surgery.). There are no hypoglycemic associated symptoms. Pertinent negatives for hypoglycemia include no confusion, headaches, pallor or seizures. Associated symptoms include fatigue. Pertinent negatives for diabetes include no chest pain, no polydipsia, no polyphagia and no polyuria. There are no hypoglycemic complications. Symptoms are improving. Diabetic complications include heart disease. Risk factors for coronary artery disease include dyslipidemia, family history, diabetes mellitus, hypertension, obesity, sedentary lifestyle and post-menopausal. Her weight is increasing steadily (She has gained 6 pounds since last visit after she lost 62 pounds following her sleeve gastrectomy.  This patient underwent bariatric surgery twice.). She is following a generally unhealthy diet. When asked about meal planning, she reported none. She has not had a previous visit with a dietitian. She never participates in exercise. Her home blood glucose trend is decreasing steadily. Her breakfast blood glucose range  is  generally 130-140 mg/dl. Her lunch blood glucose range is generally 140-180 mg/dl. Her dinner blood glucose range is generally 140-180 mg/dl. Her bedtime blood glucose range is generally 140-180 mg/dl. Her overall blood glucose range is 140-180 mg/dl. [place] presents with her CGM device.  AGP report shows 71% time range, 26% above range.  She has no hypoglycemia.  Her point-of-care A1c was 6.5%, unchanged from her last visit A1c of 6.5%.    Recent labs show A1c of 6.5%.  She is gaining some of the weight back after losing 62 pounds after sleeve gastrectomy.  This patient underwent bariatric surgery twice-gastric banding in 2009, sleeve gastrectomy in 2021.  ) An ACE inhibitor/angiotensin II receptor blocker is being taken. Eye exam is current.  Hypertension This is a chronic problem. The current episode started more than 1 year ago. The problem is uncontrolled. Pertinent negatives include no chest pain, headaches, palpitations or shortness of breath. Risk factors for coronary artery disease include diabetes mellitus, obesity, sedentary lifestyle and post-menopausal state. Past treatments include ACE inhibitors.   Review of systems: Limited as above. Objective:    Ht 5\' 6"  (1.676 m)    Wt 261 lb 6.4 oz (118.6 kg)    BMI 42.19 kg/m   Wt Readings from Last 3 Encounters:  07/02/21 261 lb 6.4 oz (118.6 kg)  06/29/21 255 lb (115.7 kg)  06/23/21 262 lb 12.8 oz (119.2 kg)     Physical Exam- Limited  Constitutional:  Body mass index is 42.19 kg/m. , not in acute distress, normal state of mind    CMP     Component Value Date/Time   NA 140 06/15/2021 1026   K 4.3 06/15/2021 1026   CL 101 06/15/2021 1026   CO2 25 06/15/2021 1026   GLUCOSE 99 06/15/2021 1026   GLUCOSE 196 (H) 03/28/2021 0322   BUN 16 06/15/2021 1026   CREATININE 0.74 06/15/2021 1026   CREATININE 0.74 03/23/2011 0400   CALCIUM 9.5 06/15/2021 1026   PROT 7.1 06/15/2021 1026   ALBUMIN 4.4 06/15/2021 1026   AST 16 06/15/2021  1026   ALT 12 06/15/2021 1026   ALKPHOS 109 06/15/2021 1026   BILITOT 0.3 06/15/2021 1026   GFRNONAA >60 03/28/2021 0322   GFRAA >60 01/01/2020 1356   Recent Results (from the past 2160 hour(s))  Comprehensive metabolic panel     Status: None   Collection Time: 06/15/21 10:26 AM  Result Value Ref Range   Glucose 99 70 - 99 mg/dL   BUN 16 8 - 27 mg/dL   Creatinine, Ser 08/13/21 0.57 - 1.00 mg/dL   eGFR 92 1.78 >37   BUN/Creatinine Ratio 22 12 - 28   Sodium 140 134 - 144 mmol/L   Potassium 4.3 3.5 - 5.2 mmol/L   Chloride 101 96 - 106 mmol/L   CO2 25 20 - 29 mmol/L   Calcium 9.5 8.7 - 10.3 mg/dL   Total Protein 7.1 6.0 - 8.5 g/dL   Albumin 4.4 3.8 - 4.8 g/dL   Globulin, Total 2.7 1.5 - 4.5 g/dL   Albumin/Globulin Ratio 1.6 1.2 - 2.2   Bilirubin Total 0.3 0.0 - 1.2 mg/dL   Alkaline Phosphatase 109 44 - 121 IU/L   AST 16 0 - 40 IU/L   ALT 12 0 - 32 IU/L  Lipid panel     Status: Abnormal   Collection Time: 06/15/21 10:26 AM  Result Value Ref Range   Cholesterol, Total 186 100 - 199 mg/dL  Triglycerides 157 (H) 0 - 149 mg/dL   HDL 54 >39 mg/dL   VLDL Cholesterol Cal 27 5 - 40 mg/dL   LDL Chol Calc (NIH) 105 (H) 0 - 99 mg/dL   Chol/HDL Ratio 3.4 0.0 - 4.4 ratio    Comment:                                   T. Chol/HDL Ratio                                             Men  Women                               1/2 Avg.Risk  3.4    3.3                                   Avg.Risk  5.0    4.4                                2X Avg.Risk  9.6    7.1                                3X Avg.Risk 23.4   11.0   TSH     Status: None   Collection Time: 06/15/21 10:26 AM  Result Value Ref Range   TSH 1.060 0.450 - 4.500 uIU/mL  T4, free     Status: None   Collection Time: 06/15/21 10:26 AM  Result Value Ref Range   Free T4 1.44 0.82 - 1.77 ng/dL  VITAMIN D 25 Hydroxy (Vit-D Deficiency, Fractures)     Status: None   Collection Time: 06/15/21 10:26 AM  Result Value Ref Range   Vit D,  25-Hydroxy 34.8 30.0 - 100.0 ng/mL    Comment: Vitamin D deficiency has been defined by the [place] practice guideline as a level of serum 25-OH vitamin D less than 20 ng/mL (1,2). The Endocrine Society went on to further define vitamin D insufficiency as a level between 21 and 29 ng/mL (2). 1. IOM (Institute of Medicine). 2010. Dietary reference    intakes for calcium and D. [place]: The    [employer]. 2. Holick MF, Binkley [place], Bischoff-Ferrari HA, et al.    Evaluation, treatment, and prevention of vitamin D    deficiency: an Endocrine Society clinical practice    guideline. JCEM. 2011 Jul; 96(7):1911-30.   Glucose, capillary     Status: Abnormal   Collection Time: 06/29/21  2:24 PM  Result Value Ref Range   Glucose-Capillary 120 (H) 70 - 99 mg/dL    Comment: Glucose reference range applies only to samples taken after fasting for at least 8 hours.  Surgical PCR Screen     Status: None   Collection Time: 06/29/21  2:33 PM   Specimen: Nasal Mucosa; Nasal Swab  Result Value Ref Range   MRSA, PCR NEGATIVE NEGATIVE   Staphylococcus aureus NEGATIVE NEGATIVE    Comment: (NOTE) The Xpert SA Assay (  FDA approved for NASAL specimens in patients 6 years of age and older), is one component of a comprehensive surveillance program. It is not intended to diagnose infection nor to guide or monitor treatment. Performed at [facility], [place] [address], [place], [place] [address]   Hemoglobin A1c per protocol     Status: Abnormal   Collection Time: 06/29/21  2:33 PM  Result Value Ref Range   Hgb A1c MFr Bld 6.5 (H) 4.8 - 5.6 %    Comment: (NOTE)         Prediabetes: 5.7 - 6.4         Diabetes: >6.4         Glycemic control for adults with diabetes: <7.0    Mean Plasma Glucose 140 mg/dL    Comment: (NOTE) Performed At: [facility]  [place], [place] [id number] Rush Farmer MD  BS:[id number]   CBC per protocol     Status: Abnormal   Collection Time: 06/29/21  2:33 PM  Result Value Ref Range   WBC 8.9 4.0 - 10.5 K/uL   RBC 5.57 (H) 3.87 - 5.11 MIL/uL   Hemoglobin 16.1 (H) 12.0 - 15.0 g/dL   HCT 49.1 (H) 36.0 - 46.0 %   MCV 88.2 80.0 - 100.0 fL   MCH 28.9 26.0 - 34.0 pg   MCHC 32.8 30.0 - 36.0 g/dL   RDW 13.3 11.5 - 15.5 %   Platelets 248 150 - 400 K/uL   nRBC 0.0 0.0 - 0.2 %    Comment: Performed at [facility], [place] [address], [place],  [address]     Assessment & Plan:   1. Uncontrolled type 2 diabetes mellitus with hyperglycemia ([place])  - NORIE LATENDRESSE has currently uncontrolled symptomatic type 2 DM since  61 years of age.  Eryn presents with her CGM device.  AGP report shows 71% time range, 26% above range.  She has no hypoglycemia.  Her point-of-care A1c was 6.5%, unchanged from her last visit A1c of 6.5%.    Recent labs show A1c of 6.5%.  She is gaining some of the weight back after losing 62 pounds after sleeve gastrectomy.  This patient underwent bariatric surgery twice-gastric banding in 2009, sleeve gastrectomy in 2021.  - Recent labs reviewed. - I had a long discussion with her about the progressive nature of diabetes and the pathology behind its complications. -her diabetes is complicated by morbid obesity, sedentary life, hypertension and she remains at a high risk for more acute and chronic complications which include CAD, CVA, CKD, retinopathy, and neuropathy. These are all discussed in detail with her.  - I have counseled her on diet  and weight management  by adopting a carbohydrate restricted/protein rich diet. Patient is encouraged to switch to  unprocessed or minimally processed     complex starch and increased protein intake (animal or plant source), fruits, and vegetables. -  she is advised to stick to a routine mealtimes to eat 3 meals  a day and avoid unnecessary snacks ( to snack only to correct  hypoglycemia).   In light of the fact that she underwent bariatric surgery twice, and currently regaining some of the weight she lost after her second- Surgery sleeve gastrectomy, she is approached for intensive therapeutic lifestyle change.  She would benefit the most from lifestyle medicine. - she acknowledges that there is a room for improvement in her food and drink choices. - Suggestion is made for her to  avoid simple carbohydrates  from her diet including Cakes, Sweet Desserts, Ice Cream, Soda (diet and regular), Sweet Tea, Candies, Chips, Cookies, Store Bought Juices, Alcohol , Artificial Sweeteners,  Coffee Creamer, and "Sugar-free" Products, Lemonade. This will help patient to have more stable blood glucose profile and potentially avoid unintended weight gain.  The following Lifestyle Medicine recommendations according to [place]  [facility]) were discussed and and offered to patient and she  agrees to start the journey:  A. Whole Foods, Plant-Based Nutrition comprising of fruits and vegetables, plant-based proteins, whole-grain carbohydrates was discussed in detail with the patient.   A list for source of those nutrients were also provided to the patient.  Patient will use only water or unsweetened tea for hydration. B.  The need to stay away from risky substances including alcohol, smoking; obtaining 7 to 9 hours of restorative sleep, at least 150 minutes of moderate intensity exercise weekly, the importance of healthy social connections,  and stress management techniques were discussed.   - I have approached her with the following individualized plan to manage  her diabetes and patient agrees:   Her previsit labs show A1c of 6.5%.  She will continue to benefit from Trulicity intervention.  This was increased to 1.5 mg subcutaneously weekly.  She is advised to continue.  She will be considered for other medications including metformin on subsequent visits if she  continues to lose control. - Specific targets for  A1c;  LDL, HDL,  and Triglycerides were discussed with the patient.   2) Blood Pressure /Hypertension: -Her blood pressure was controlled to target.   she is advised to continue her current medications including lisinopril 5 mg p.o. daily with breakfast , lisinopril will be increased to  10 mg for next refill.  3) Lipids/Hyperlipidemia:     -Her recent lipid panel showed uncontrolled LDL at 105.  She will be considered for statin therapy after next visit.    4)  Weight/Diet: Her BMI is 42.19-need some weight after she lost 62 pounds due to sleeve gastrectomy.  See lifestyle medicine recommendations above.  5) Chronic Care/Health Maintenance:  -she  is on ACEI/ARB and  is encouraged to initiate and continue to follow up with Ophthalmology, Dentist,  Podiatrist at least yearly or according to recommendations, and advised to  stay away from smoking. I have recommended yearly flu vaccine and pneumonia vaccine at least every 5 years; moderate intensity exercise for up to 150 minutes weekly; and  sleep for at least 7 hours a day.  Her screening ABI was normal in December 2022, This study will be repeated in 5 years-December 2026, or sooner if needed.  - she is  advised to maintain close follow up with Sharilyn Sites, MD for primary care needs, as well as her other providers for optimal and coordinated care.   I spent 44 minutes in the care of the patient today including review of labs from [place], Lipids, Thyroid Function, Hematology (current and previous including abstractions from other facilities); face-to-face time discussing  her blood glucose readings/logs, discussing hypoglycemia and hyperglycemia episodes and symptoms, medications doses, her options of short and long term treatment based on the latest standards of care / guidelines;  discussion about incorporating lifestyle medicine;  and documenting the encounter.    Please refer to Patient  Instructions for Blood Glucose Monitoring and Insulin/Medications Dosing Guide"  in media tab for additional information. Please  also refer to " Patient Self Inventory" in the  Media  tab for reviewed elements of pertinent patient history.  Francesca Jewett participated in the discussions, expressed understanding, and voiced agreement with the above plans.  All questions were answered to her satisfaction. she is encouraged to contact clinic should she have any questions or concerns prior to her return visit.    Follow up plan: - Return in about 6 months (around 12/30/2021) for Bring Meter and Logs- A1c in Office.  Glade Lloyd, MD [facility] [address] [place], [place] [address] Phone: [phone]  Fax: [phone]    07/02/2021, 8:28 PM  This note was partially dictated with voice recognition software. Similar sounding words can be transcribed inadequately or may not  be corrected upon review.

## 2021-07-07 DIAGNOSIS — E1165 Type 2 diabetes mellitus with hyperglycemia: Secondary | ICD-10-CM | POA: Diagnosis not present

## 2021-07-08 ENCOUNTER — Encounter (HOSPITAL_COMMUNITY)
Admission: RE | Admit: 2021-07-08 | Discharge: 2021-07-08 | Disposition: A | Discharge status: Home / Self Care | Payer: Medicare Other | Source: Ambulatory Visit | Attending: Orthopaedic Surgery | Admitting: Orthopaedic Surgery

## 2021-07-08 ENCOUNTER — Other Ambulatory Visit: Payer: Self-pay

## 2021-07-08 DIAGNOSIS — Z79899 Other long term (current) drug therapy: Secondary | ICD-10-CM | POA: Diagnosis not present

## 2021-07-08 DIAGNOSIS — Z20822 Contact with and (suspected) exposure to covid-19: Secondary | ICD-10-CM | POA: Insufficient documentation

## 2021-07-08 DIAGNOSIS — Z01812 Encounter for preprocedural laboratory examination: Secondary | ICD-10-CM | POA: Insufficient documentation

## 2021-07-08 DIAGNOSIS — Z90710 Acquired absence of both cervix and uterus: Secondary | ICD-10-CM | POA: Diagnosis not present

## 2021-07-08 DIAGNOSIS — M21161 Varus deformity, not elsewhere classified, right knee: Secondary | ICD-10-CM | POA: Diagnosis not present

## 2021-07-08 DIAGNOSIS — Z794 Long term (current) use of insulin: Secondary | ICD-10-CM | POA: Diagnosis not present

## 2021-07-08 DIAGNOSIS — Z01818 Encounter for other preprocedural examination: Secondary | ICD-10-CM

## 2021-07-08 DIAGNOSIS — F418 Other specified anxiety disorders: Secondary | ICD-10-CM | POA: Diagnosis present

## 2021-07-08 DIAGNOSIS — K219 Gastro-esophageal reflux disease without esophagitis: Secondary | ICD-10-CM | POA: Diagnosis not present

## 2021-07-08 DIAGNOSIS — E1142 Type 2 diabetes mellitus with diabetic polyneuropathy: Secondary | ICD-10-CM | POA: Diagnosis not present

## 2021-07-08 DIAGNOSIS — Z96652 Presence of left artificial knee joint: Secondary | ICD-10-CM | POA: Diagnosis not present

## 2021-07-08 DIAGNOSIS — I10 Essential (primary) hypertension: Secondary | ICD-10-CM | POA: Diagnosis not present

## 2021-07-08 DIAGNOSIS — Z6841 Body Mass Index (BMI) 40.0 and over, adult: Secondary | ICD-10-CM | POA: Diagnosis not present

## 2021-07-08 DIAGNOSIS — M1711 Unilateral primary osteoarthritis, right knee: Secondary | ICD-10-CM | POA: Diagnosis not present

## 2021-07-08 LAB — SARS CORONAVIRUS 2 (TAT 6-24 HRS): SARS Coronavirus 2: NEGATIVE

## 2021-07-10 ENCOUNTER — Ambulatory Visit (HOSPITAL_COMMUNITY): Payer: Medicare Other | Admitting: Anesthesiology

## 2021-07-10 ENCOUNTER — Inpatient Hospital Stay (HOSPITAL_COMMUNITY)
Admission: AD | Admit: 2021-07-10 | Discharge: 2021-07-12 | DRG: 470 | Disposition: A | Discharge status: Home / Self Care | Payer: Medicare Other | Attending: Orthopedic Surgery | Admitting: Orthopedic Surgery

## 2021-07-10 ENCOUNTER — Encounter (HOSPITAL_COMMUNITY): Payer: Self-pay | Admitting: Orthopedic Surgery

## 2021-07-10 ENCOUNTER — Other Ambulatory Visit: Payer: Self-pay

## 2021-07-10 ENCOUNTER — Encounter (HOSPITAL_COMMUNITY)
Admission: AD | Disposition: A | Discharge status: Home / Self Care | Payer: Self-pay | Source: Ambulatory Visit | Attending: Orthopedic Surgery

## 2021-07-10 DIAGNOSIS — M21161 Varus deformity, not elsewhere classified, right knee: Secondary | ICD-10-CM | POA: Diagnosis not present

## 2021-07-10 DIAGNOSIS — Z96652 Presence of left artificial knee joint: Secondary | ICD-10-CM | POA: Diagnosis present

## 2021-07-10 DIAGNOSIS — M1711 Unilateral primary osteoarthritis, right knee: Secondary | ICD-10-CM | POA: Diagnosis not present

## 2021-07-10 DIAGNOSIS — F418 Other specified anxiety disorders: Secondary | ICD-10-CM | POA: Diagnosis present

## 2021-07-10 DIAGNOSIS — K219 Gastro-esophageal reflux disease without esophagitis: Secondary | ICD-10-CM | POA: Diagnosis present

## 2021-07-10 DIAGNOSIS — Z79899 Other long term (current) drug therapy: Secondary | ICD-10-CM

## 2021-07-10 DIAGNOSIS — Z6841 Body Mass Index (BMI) 40.0 and over, adult: Secondary | ICD-10-CM

## 2021-07-10 DIAGNOSIS — Z20822 Contact with and (suspected) exposure to covid-19: Secondary | ICD-10-CM | POA: Diagnosis present

## 2021-07-10 DIAGNOSIS — I10 Essential (primary) hypertension: Secondary | ICD-10-CM | POA: Diagnosis present

## 2021-07-10 DIAGNOSIS — Z90710 Acquired absence of both cervix and uterus: Secondary | ICD-10-CM

## 2021-07-10 DIAGNOSIS — E1142 Type 2 diabetes mellitus with diabetic polyneuropathy: Secondary | ICD-10-CM | POA: Diagnosis present

## 2021-07-10 DIAGNOSIS — Z794 Long term (current) use of insulin: Secondary | ICD-10-CM | POA: Diagnosis not present

## 2021-07-10 DIAGNOSIS — Z96651 Presence of right artificial knee joint: Secondary | ICD-10-CM

## 2021-07-10 HISTORY — PX: TOTAL KNEE ARTHROPLASTY: SHX125

## 2021-07-10 LAB — GLUCOSE, CAPILLARY
Glucose-Capillary: 92 mg/dL (ref 70–99)
Glucose-Capillary: 141 mg/dL — ABNORMAL HIGH (ref 70–99)
Glucose-Capillary: 186 mg/dL — ABNORMAL HIGH (ref 70–99)

## 2021-07-10 SURGERY — ARTHROPLASTY, KNEE, TOTAL
Anesthesia: Monitor Anesthesia Care | Site: Knee | Laterality: Right

## 2021-07-10 MED ORDER — TRANEXAMIC ACID-NACL 1000-0.7 MG/100ML-% IV SOLN
1000.0000 mg | INTRAVENOUS | Status: AC
  Administered 2021-07-10: 1000 mg via INTRAVENOUS
  Filled 2021-07-10: qty 100

## 2021-07-10 MED ORDER — ACETAMINOPHEN 325 MG PO TABS: 325.0000 mg | ORAL_TABLET | Freq: Four times a day (QID) | ORAL | Status: DC | PRN

## 2021-07-10 MED ORDER — OXYCODONE-ACETAMINOPHEN 5-325 MG PO TABS
1.0000 | ORAL_TABLET | ORAL | 0 refills | Status: DC | PRN
Start: 2021-07-10 — End: 2021-08-12

## 2021-07-10 MED ORDER — ARIPIPRAZOLE 10 MG PO TABS
10.0000 mg | ORAL_TABLET | Freq: Every day | ORAL | Status: DC
  Administered 2021-07-10 – 2021-07-11 (×2): 10 mg via ORAL
  Filled 2021-07-10 (×2): qty 1

## 2021-07-10 MED ORDER — HYDROMORPHONE HCL 1 MG/ML IJ SOLN
0.5000 mg | INTRAMUSCULAR | Status: DC | PRN
  Administered 2021-07-10: 1 mg via INTRAVENOUS
  Filled 2021-07-10 (×2): qty 1

## 2021-07-10 MED ORDER — ADULT MULTIVITAMIN W/MINERALS CH
1.0000 | ORAL_TABLET | Freq: Every day | ORAL | Status: DC
  Administered 2021-07-11 – 2021-07-12 (×2): 1 via ORAL
  Filled 2021-07-10 (×2): qty 1

## 2021-07-10 MED ORDER — ONDANSETRON HCL 4 MG PO TABS: 4.0000 mg | ORAL_TABLET | Freq: Three times a day (TID) | ORAL | 1 refills | Status: DC | PRN

## 2021-07-10 MED ORDER — PROPOFOL 10 MG/ML IV BOLUS
INTRAVENOUS | Status: DC | PRN
  Administered 2021-07-10: 30 mg via INTRAVENOUS
  Administered 2021-07-10: 20 mg via INTRAVENOUS

## 2021-07-10 MED ORDER — BUPIVACAINE LIPOSOME 1.3 % IJ SUSP
INTRAMUSCULAR | Status: AC
  Filled 2021-07-10: qty 20

## 2021-07-10 MED ORDER — PANTOPRAZOLE SODIUM 40 MG PO TBEC
40.0000 mg | DELAYED_RELEASE_TABLET | Freq: Two times a day (BID) | ORAL | Status: DC
  Administered 2021-07-10 – 2021-07-12 (×4): 40 mg via ORAL
  Filled 2021-07-10 (×4): qty 1

## 2021-07-10 MED ORDER — ASCORBIC ACID 500 MG PO TABS
500.0000 mg | ORAL_TABLET | Freq: Every day | ORAL | Status: DC
Start: 2021-07-11 — End: 2021-07-12
  Administered 2021-07-11 – 2021-07-12 (×2): 500 mg via ORAL
  Filled 2021-07-10 (×2): qty 1

## 2021-07-10 MED ORDER — PROPOFOL 10 MG/ML IV BOLUS
INTRAVENOUS | Status: AC
  Filled 2021-07-10: qty 20

## 2021-07-10 MED ORDER — RIZATRIPTAN BENZOATE 10 MG PO TBDP: 10.0000 mg | ORAL_TABLET | ORAL | Status: DC | PRN

## 2021-07-10 MED ORDER — PHENYLEPHRINE 40 MCG/ML (10ML) SYRINGE FOR IV PUSH (FOR BLOOD PRESSURE SUPPORT)
PREFILLED_SYRINGE | INTRAVENOUS | Status: DC | PRN
  Administered 2021-07-10: 80 ug via INTRAVENOUS
  Administered 2021-07-10: 120 ug via INTRAVENOUS
  Administered 2021-07-10: 80 ug via INTRAVENOUS

## 2021-07-10 MED ORDER — MECLIZINE HCL 25 MG PO TABS: 25.0000 mg | ORAL_TABLET | Freq: Two times a day (BID) | ORAL | Status: DC | PRN

## 2021-07-10 MED ORDER — MIDAZOLAM HCL 2 MG/2ML IJ SOLN
1.0000 mg | INTRAMUSCULAR | Status: DC
  Administered 2021-07-10: 1 mg via INTRAVENOUS
  Filled 2021-07-10: qty 2

## 2021-07-10 MED ORDER — SODIUM CHLORIDE (PF) 0.9 % IJ SOLN
INTRAMUSCULAR | Status: AC
  Filled 2021-07-10: qty 30

## 2021-07-10 MED ORDER — 0.9 % SODIUM CHLORIDE (POUR BTL) OPTIME
TOPICAL | Status: DC | PRN
  Administered 2021-07-10: 1000 mL

## 2021-07-10 MED ORDER — SACCHAROMYCES BOULARDII 250 MG PO CAPS
250.0000 mg | ORAL_CAPSULE | Freq: Every day | ORAL | Status: DC
  Administered 2021-07-10 – 2021-07-12 (×3): 250 mg via ORAL
  Filled 2021-07-10 (×3): qty 1

## 2021-07-10 MED ORDER — METOCLOPRAMIDE HCL 5 MG PO TABS: 5.0000 mg | ORAL_TABLET | Freq: Three times a day (TID) | ORAL | Status: DC | PRN

## 2021-07-10 MED ORDER — CHLORHEXIDINE GLUCONATE 0.12 % MT SOLN
15.0000 mL | Freq: Once | OROMUCOSAL | Status: AC
  Administered 2021-07-10: 15 mL via OROMUCOSAL

## 2021-07-10 MED ORDER — DEXAMETHASONE SODIUM PHOSPHATE 10 MG/ML IJ SOLN
INTRAMUSCULAR | Status: DC | PRN
  Administered 2021-07-10: 4 mg via INTRAVENOUS

## 2021-07-10 MED ORDER — METHOCARBAMOL 500 MG IVPB - SIMPLE MED
500.0000 mg | Freq: Four times a day (QID) | INTRAVENOUS | Status: DC | PRN
  Filled 2021-07-10: qty 50

## 2021-07-10 MED ORDER — SODIUM CHLORIDE 0.9 % IV SOLN
INTRAVENOUS | Status: DC | PRN
  Administered 2021-07-10: 80 mL

## 2021-07-10 MED ORDER — ONDANSETRON HCL 4 MG/2ML IJ SOLN
INTRAMUSCULAR | Status: AC
  Filled 2021-07-10: qty 2

## 2021-07-10 MED ORDER — PROPOFOL 500 MG/50ML IV EMUL
INTRAVENOUS | Status: AC
  Filled 2021-07-10: qty 50

## 2021-07-10 MED ORDER — POLYETHYLENE GLYCOL 3350 17 G PO PACK: 17.0000 g | PACK | Freq: Every day | ORAL | Status: DC | PRN

## 2021-07-10 MED ORDER — GABAPENTIN 100 MG PO CAPS
100.0000 mg | ORAL_CAPSULE | Freq: Two times a day (BID) | ORAL | Status: DC
  Administered 2021-07-10 – 2021-07-12 (×4): 100 mg via ORAL
  Filled 2021-07-10 (×4): qty 1

## 2021-07-10 MED ORDER — DICLOFENAC SODIUM 75 MG PO TBEC
75.0000 mg | DELAYED_RELEASE_TABLET | Freq: Two times a day (BID) | ORAL | Status: DC
  Administered 2021-07-11 – 2021-07-12 (×3): 75 mg via ORAL
  Filled 2021-07-10 (×3): qty 1

## 2021-07-10 MED ORDER — ONDANSETRON HCL 4 MG/2ML IJ SOLN: 4.0000 mg | Freq: Four times a day (QID) | INTRAMUSCULAR | Status: DC | PRN

## 2021-07-10 MED ORDER — FENTANYL CITRATE PF 50 MCG/ML IJ SOSY
50.0000 ug | PREFILLED_SYRINGE | INTRAMUSCULAR | Status: DC
  Administered 2021-07-10: 50 ug via INTRAVENOUS
  Filled 2021-07-10: qty 2

## 2021-07-10 MED ORDER — BUPIVACAINE-EPINEPHRINE 0.5% -1:200000 IJ SOLN
INTRAMUSCULAR | Status: AC
  Filled 2021-07-10: qty 1

## 2021-07-10 MED ORDER — TEMAZEPAM 15 MG PO CAPS
30.0000 mg | ORAL_CAPSULE | Freq: Every evening | ORAL | Status: DC | PRN
  Administered 2021-07-10: 30 mg via ORAL
  Filled 2021-07-10: qty 2

## 2021-07-10 MED ORDER — ESCITALOPRAM OXALATE 20 MG PO TABS
20.0000 mg | ORAL_TABLET | Freq: Every day | ORAL | Status: DC
Start: 2021-07-10 — End: 2021-07-12
  Administered 2021-07-10 – 2021-07-12 (×3): 20 mg via ORAL
  Filled 2021-07-10 (×3): qty 1

## 2021-07-10 MED ORDER — ONDANSETRON HCL 4 MG PO TABS
4.0000 mg | ORAL_TABLET | Freq: Four times a day (QID) | ORAL | Status: DC | PRN
  Administered 2021-07-11: 4 mg via ORAL

## 2021-07-10 MED ORDER — ONDANSETRON HCL 4 MG/2ML IJ SOLN
4.0000 mg | Freq: Four times a day (QID) | INTRAMUSCULAR | Status: DC | PRN
  Administered 2021-07-10 – 2021-07-11 (×2): 4 mg via INTRAVENOUS
  Filled 2021-07-10 (×2): qty 2

## 2021-07-10 MED ORDER — DEXAMETHASONE SODIUM PHOSPHATE 10 MG/ML IJ SOLN
INTRAMUSCULAR | Status: AC
  Filled 2021-07-10: qty 1

## 2021-07-10 MED ORDER — SUMATRIPTAN SUCCINATE 50 MG PO TABS
50.0000 mg | ORAL_TABLET | ORAL | Status: DC | PRN
  Filled 2021-07-10 (×2): qty 1

## 2021-07-10 MED ORDER — BUPIVACAINE IN DEXTROSE 0.75-8.25 % IT SOLN
INTRATHECAL | Status: DC | PRN
Start: 2021-07-10 — End: 2021-07-10
  Administered 2021-07-10: 1.8 mL via INTRATHECAL

## 2021-07-10 MED ORDER — WATER FOR IRRIGATION, STERILE IR SOLN
Status: DC | PRN
  Administered 2021-07-10: 2000 mL

## 2021-07-10 MED ORDER — LORATADINE 10 MG PO TABS
10.0000 mg | ORAL_TABLET | Freq: Every day | ORAL | Status: DC
Start: 2021-07-11 — End: 2021-07-12
  Administered 2021-07-11 – 2021-07-12 (×2): 10 mg via ORAL
  Filled 2021-07-10 (×2): qty 1

## 2021-07-10 MED ORDER — ALPRAZOLAM 1 MG PO TABS
1.0000 mg | ORAL_TABLET | Freq: Three times a day (TID) | ORAL | Status: DC | PRN
  Administered 2021-07-11: 1 mg via ORAL
  Filled 2021-07-10: qty 1

## 2021-07-10 MED ORDER — EPHEDRINE 5 MG/ML INJ
INTRAVENOUS | Status: AC
  Filled 2021-07-10: qty 5

## 2021-07-10 MED ORDER — DOCUSATE SODIUM 100 MG PO CAPS
100.0000 mg | ORAL_CAPSULE | Freq: Two times a day (BID) | ORAL | Status: DC
  Administered 2021-07-10 – 2021-07-12 (×4): 100 mg via ORAL
  Filled 2021-07-10 (×4): qty 1

## 2021-07-10 MED ORDER — CYCLOSPORINE 0.05 % OP EMUL
1.0000 [drp] | Freq: Two times a day (BID) | OPHTHALMIC | Status: DC
  Administered 2021-07-10 – 2021-07-12 (×4): 1 [drp] via OPHTHALMIC
  Filled 2021-07-10 (×4): qty 30

## 2021-07-10 MED ORDER — PHENYLEPHRINE 40 MCG/ML (10ML) SYRINGE FOR IV PUSH (FOR BLOOD PRESSURE SUPPORT)
PREFILLED_SYRINGE | INTRAVENOUS | Status: AC
  Filled 2021-07-10: qty 10

## 2021-07-10 MED ORDER — POVIDONE-IODINE 10 % EX SWAB
2.0000 "application " | Freq: Once | CUTANEOUS | Status: AC
  Administered 2021-07-10: 2 via TOPICAL

## 2021-07-10 MED ORDER — OXYCODONE HCL 5 MG PO TABS
5.0000 mg | ORAL_TABLET | ORAL | Status: DC | PRN
  Administered 2021-07-10 – 2021-07-12 (×7): 10 mg via ORAL
  Filled 2021-07-10 (×7): qty 2

## 2021-07-10 MED ORDER — OYSTER SHELL CALCIUM/D3 500-5 MG-MCG PO TABS
1.0000 | ORAL_TABLET | Freq: Every day | ORAL | Status: DC
  Administered 2021-07-10 – 2021-07-12 (×3): 1 via ORAL
  Filled 2021-07-10 (×3): qty 1

## 2021-07-10 MED ORDER — PHENOL 1.4 % MT LIQD: 1.0000 | OROMUCOSAL | Status: DC | PRN

## 2021-07-10 MED ORDER — FREESTYLE LIBRE 2 SENSOR MISC: 1.0000 | Status: DC

## 2021-07-10 MED ORDER — CEFAZOLIN SODIUM-DEXTROSE 2-4 GM/100ML-% IV SOLN
2.0000 g | Freq: Four times a day (QID) | INTRAVENOUS | Status: AC
  Administered 2021-07-10 – 2021-07-11 (×2): 2 g via INTRAVENOUS
  Filled 2021-07-10 (×2): qty 100

## 2021-07-10 MED ORDER — BUPIVACAINE LIPOSOME 1.3 % IJ SUSP: 20.0000 mL | Freq: Once | INTRAMUSCULAR | Status: DC

## 2021-07-10 MED ORDER — OXYCODONE HCL 5 MG/5ML PO SOLN: 5.0000 mg | Freq: Once | ORAL | Status: DC | PRN

## 2021-07-10 MED ORDER — INSULIN ASPART 100 UNIT/ML IJ SOLN: 0.0000 [IU] | Freq: Every day | INTRAMUSCULAR | Status: DC

## 2021-07-10 MED ORDER — METHOCARBAMOL 500 MG PO TABS: 500.0000 mg | ORAL_TABLET | Freq: Four times a day (QID) | ORAL | 1 refills | Status: DC | PRN

## 2021-07-10 MED ORDER — MENTHOL 3 MG MT LOZG: 1.0000 | LOZENGE | OROMUCOSAL | Status: DC | PRN

## 2021-07-10 MED ORDER — ASPIRIN 81 MG PO CHEW
81.0000 mg | CHEWABLE_TABLET | Freq: Two times a day (BID) | ORAL | Status: DC
  Administered 2021-07-10 – 2021-07-12 (×4): 81 mg via ORAL
  Filled 2021-07-10 (×4): qty 1

## 2021-07-10 MED ORDER — METHOCARBAMOL 500 MG PO TABS
500.0000 mg | ORAL_TABLET | Freq: Four times a day (QID) | ORAL | Status: DC | PRN
Start: 2021-07-10 — End: 2021-07-12
  Administered 2021-07-10 – 2021-07-11 (×3): 500 mg via ORAL
  Filled 2021-07-10 (×3): qty 1

## 2021-07-10 MED ORDER — LINACLOTIDE 145 MCG PO CAPS
145.0000 ug | ORAL_CAPSULE | Freq: Every day | ORAL | Status: DC
  Administered 2021-07-11 – 2021-07-12 (×2): 145 ug via ORAL
  Filled 2021-07-10 (×2): qty 1

## 2021-07-10 MED ORDER — POLYVINYL ALCOHOL 1.4 % OP SOLN
1.0000 [drp] | Freq: Two times a day (BID) | OPHTHALMIC | Status: DC
  Filled 2021-07-10: qty 15

## 2021-07-10 MED ORDER — ORAL CARE MOUTH RINSE: 15.0000 mL | Freq: Once | OROMUCOSAL | Status: AC

## 2021-07-10 MED ORDER — METOCLOPRAMIDE HCL 5 MG/ML IJ SOLN: 5.0000 mg | Freq: Three times a day (TID) | INTRAMUSCULAR | Status: DC | PRN

## 2021-07-10 MED ORDER — INSULIN ASPART 100 UNIT/ML IJ SOLN
0.0000 [IU] | Freq: Three times a day (TID) | INTRAMUSCULAR | Status: DC
  Administered 2021-07-11: 4 [IU] via SUBCUTANEOUS
  Administered 2021-07-11: 3 [IU] via SUBCUTANEOUS
  Administered 2021-07-11 – 2021-07-12 (×2): 4 [IU] via SUBCUTANEOUS

## 2021-07-10 MED ORDER — EPHEDRINE SULFATE-NACL 50-0.9 MG/10ML-% IV SOSY
PREFILLED_SYRINGE | INTRAVENOUS | Status: DC | PRN
  Administered 2021-07-10: 10 mg via INTRAVENOUS

## 2021-07-10 MED ORDER — ONDANSETRON HCL 4 MG/2ML IJ SOLN
INTRAMUSCULAR | Status: DC | PRN
  Administered 2021-07-10: 4 mg via INTRAVENOUS

## 2021-07-10 MED ORDER — INSULIN ASPART 100 UNIT/ML IJ SOLN
6.0000 [IU] | Freq: Three times a day (TID) | INTRAMUSCULAR | Status: DC
  Administered 2021-07-11 – 2021-07-12 (×4): 6 [IU] via SUBCUTANEOUS

## 2021-07-10 MED ORDER — SODIUM CHLORIDE 0.9 % IR SOLN
Status: DC | PRN
  Administered 2021-07-10: 1000 mL

## 2021-07-10 MED ORDER — CEFAZOLIN SODIUM-DEXTROSE 2-4 GM/100ML-% IV SOLN
2.0000 g | INTRAVENOUS | Status: AC
  Administered 2021-07-10: 2 g via INTRAVENOUS
  Filled 2021-07-10: qty 100

## 2021-07-10 MED ORDER — FENTANYL CITRATE PF 50 MCG/ML IJ SOSY: 25.0000 ug | PREFILLED_SYRINGE | INTRAMUSCULAR | Status: DC | PRN

## 2021-07-10 MED ORDER — OXYCODONE HCL 5 MG PO TABS: 5.0000 mg | ORAL_TABLET | Freq: Once | ORAL | Status: DC | PRN

## 2021-07-10 MED ORDER — ONDANSETRON HCL 4 MG PO TABS
4.0000 mg | ORAL_TABLET | Freq: Three times a day (TID) | ORAL | Status: DC | PRN
  Filled 2021-07-10: qty 1

## 2021-07-10 MED ORDER — FLUTICASONE PROPIONATE 50 MCG/ACT NA SUSP
2.0000 | Freq: Every day | NASAL | Status: DC | PRN
  Filled 2021-07-10: qty 16

## 2021-07-10 MED ORDER — BISACODYL 10 MG RE SUPP: 10.0000 mg | Freq: Every day | RECTAL | Status: DC | PRN

## 2021-07-10 MED ORDER — MIDAZOLAM HCL 2 MG/2ML IJ SOLN
INTRAMUSCULAR | Status: DC | PRN
  Administered 2021-07-10 (×2): 1 mg via INTRAVENOUS

## 2021-07-10 MED ORDER — SODIUM CHLORIDE 0.9 % IV SOLN: INTRAVENOUS | Status: DC

## 2021-07-10 MED ORDER — LACTATED RINGERS IV SOLN: INTRAVENOUS | Status: DC

## 2021-07-10 MED ORDER — ASPIRIN 81 MG PO CHEW: 81.0000 mg | CHEWABLE_TABLET | Freq: Two times a day (BID) | ORAL | 0 refills | Status: AC

## 2021-07-10 MED ORDER — PROPOFOL 500 MG/50ML IV EMUL
INTRAVENOUS | Status: DC | PRN
  Administered 2021-07-10: 75 ug/kg/min via INTRAVENOUS

## 2021-07-10 MED ORDER — TRANEXAMIC ACID-NACL 1000-0.7 MG/100ML-% IV SOLN
1000.0000 mg | Freq: Once | INTRAVENOUS | Status: AC
  Administered 2021-07-10: 1000 mg via INTRAVENOUS
  Filled 2021-07-10: qty 100

## 2021-07-10 MED ORDER — MIDAZOLAM HCL 2 MG/2ML IJ SOLN
INTRAMUSCULAR | Status: AC
  Filled 2021-07-10: qty 2

## 2021-07-10 SURGICAL SUPPLY — 55 items
ATTUNE PSFEM RTSZ6 NARCEM KNEE (Femur) ×1 IMPLANT
ATTUNE PSRP INSR SZ6 6 KNEE (Insert) ×1 IMPLANT
BAG COUNTER SPONGE SURGICOUNT (BAG) ×1 IMPLANT
BAG SPEC THK2 15X12 ZIP CLS (MISCELLANEOUS) ×1
BAG SPNG CNTER NS LX DISP (BAG) ×1
BAG ZIPLOCK 12X15 (MISCELLANEOUS) ×1 IMPLANT
BASE TIBIAL ROT PLAT SZ 5 KNEE (Knees) IMPLANT
BIT DRILL 1.6MX128 (BIT) ×1 IMPLANT
BLADE SAG 18X100X1.27 (BLADE) ×2 IMPLANT
BLADE SAW SGTL 13X75X1.27 (BLADE) ×2 IMPLANT
BNDG CMPR MED 10X6 ELC LF (GAUZE/BANDAGES/DRESSINGS) ×1
BNDG ELASTIC 6X10 VLCR STRL LF (GAUZE/BANDAGES/DRESSINGS) ×2 IMPLANT
BNDG GAUZE ELAST 4 BULKY (GAUZE/BANDAGES/DRESSINGS) ×2 IMPLANT
BOWL SMART MIX CTS (DISPOSABLE) ×2 IMPLANT
BSPLAT TIB 5 CMNT ROT PLAT STR (Knees) ×1 IMPLANT
CEMENT HV SMART SET (Cement) ×4 IMPLANT
COVER SURGICAL LIGHT HANDLE (MISCELLANEOUS) ×2 IMPLANT
CUFF TOURN SGL QUICK 34 (TOURNIQUET CUFF) ×2
CUFF TRNQT CYL 34X4.125X (TOURNIQUET CUFF) ×1 IMPLANT
DRAPE INCISE IOBAN 66X45 STRL (DRAPES) ×2 IMPLANT
DRAPE SHEET LG 3/4 BI-LAMINATE (DRAPES) ×2 IMPLANT
DRAPE U-SHAPE 47X51 STRL (DRAPES) ×2 IMPLANT
DRSG ADAPTIC 3X8 NADH LF (GAUZE/BANDAGES/DRESSINGS) ×2 IMPLANT
DRSG PAD ABDOMINAL 8X10 ST (GAUZE/BANDAGES/DRESSINGS) ×2 IMPLANT
DURAPREP 26ML APPLICATOR (WOUND CARE) ×2 IMPLANT
ELECT REM PT RETURN 15FT ADLT (MISCELLANEOUS) ×2 IMPLANT
GAUZE SPONGE 4X4 12PLY STRL (GAUZE/BANDAGES/DRESSINGS) ×2 IMPLANT
GLOVE SURG ORTHO LTX SZ7.5 (GLOVE) ×2 IMPLANT
GLOVE SURG ORTHO LTX SZ8.5 (GLOVE) ×2 IMPLANT
GLOVE SURG UNDER POLY LF SZ7.5 (GLOVE) ×2 IMPLANT
GLOVE SURG UNDER POLY LF SZ8.5 (GLOVE) ×2 IMPLANT
GOWN STRL REUS W/TWL XL LVL3 (GOWN DISPOSABLE) ×4 IMPLANT
HANDPIECE INTERPULSE COAX TIP (DISPOSABLE) ×2
HOLDER FOLEY CATH W/STRAP (MISCELLANEOUS) IMPLANT
IMMOBILIZER KNEE 20 (SOFTGOODS) ×2
IMMOBILIZER KNEE 20 THIGH 36 (SOFTGOODS) IMPLANT
KIT TURNOVER KIT A (KITS) IMPLANT
MANIFOLD NEPTUNE II (INSTRUMENTS) ×2 IMPLANT
NS IRRIG 1000ML POUR BTL (IV SOLUTION) ×2 IMPLANT
PACK TOTAL KNEE CUSTOM (KITS) ×2 IMPLANT
PATELLA MEDIAL ATTUN 35MM KNEE (Knees) ×1 IMPLANT
PROTECTOR NERVE ULNAR (MISCELLANEOUS) ×2 IMPLANT
SET HNDPC FAN SPRY TIP SCT (DISPOSABLE) ×1 IMPLANT
SPONGE T-LAP 18X18 ~~LOC~~+RFID (SPONGE) ×2 IMPLANT
STAPLER VISISTAT 35W (STAPLE) IMPLANT
STRIP CLOSURE SKIN 1/2X4 (GAUZE/BANDAGES/DRESSINGS) ×4 IMPLANT
SUT MNCRL AB 3-0 PS2 18 (SUTURE) ×2 IMPLANT
SUT VIC AB 0 CT1 36 (SUTURE) ×2 IMPLANT
SUT VIC AB 1 CT1 36 (SUTURE) ×6 IMPLANT
SUT VIC AB 2-0 CT1 27 (SUTURE) ×2
SUT VIC AB 2-0 CT1 TAPERPNT 27 (SUTURE) ×1 IMPLANT
TIBIAL BASE ROT PLAT SZ 5 KNEE (Knees) ×2 IMPLANT
TRAY FOLEY MTR SLVR 16FR STAT (SET/KITS/TRAYS/PACK) ×1 IMPLANT
WATER STERILE IRR 1000ML POUR (IV SOLUTION) ×4 IMPLANT
WRAP KNEE MAXI GEL POST OP (GAUZE/BANDAGES/DRESSINGS) ×1 IMPLANT

## 2021-07-10 NOTE — Anesthesia Preprocedure Evaluation (Signed)
Anesthesia Evaluation  ?Patient identified by MRN, date of birth, ID band ?Patient awake ? ? ? ?Reviewed: ?Allergy & Precautions, H&P , NPO status , Patient's Chart, lab work & pertinent test results ? ?Airway ?Mallampati: II ? ? ?Neck ROM: full ? ? ? Dental ?  ?Pulmonary ?sleep apnea ,  ?  ?breath sounds clear to auscultation ? ? ? ? ? ? Cardiovascular ?hypertension,  ?Rhythm:regular Rate:Normal ? ? ?  ?Neuro/Psych ? Headaches, PSYCHIATRIC DISORDERS Anxiety Depression Bipolar Disorder   ? GI/Hepatic ?hiatal hernia, GERD  ,  ?Endo/Other  ?diabetes, Type 2Morbid obesity ? Renal/GU ?  ? ?  ?Musculoskeletal ? ? Abdominal ?  ?Peds ? Hematology ?  ?Anesthesia Other Findings ? ? Reproductive/Obstetrics ? ?  ? ? ? ? ? ? ? ? ? ? ? ? ? ?  ?  ? ? ? ? ? ? ? ? ?Anesthesia Physical ?Anesthesia Plan ? ?ASA: 2 ? ?Anesthesia Plan: MAC and Spinal  ? ?Post-op Pain Management: Regional block*  ? ?Induction: Intravenous ? ?PONV Risk Score and Plan: 2 and Ondansetron, Propofol infusion, Midazolam and Treatment may vary due to age or medical condition ? ?Airway Management Planned: Simple Face Mask ? ?Additional Equipment:  ? ?Intra-op Plan:  ? ?Post-operative Plan:  ? ?Informed Consent: I have reviewed the patients History and Physical, chart, labs and discussed the procedure including the risks, benefits and alternatives for the proposed anesthesia with the patient or authorized representative who has indicated his/her understanding and acceptance.  ? ? ? ?Dental advisory given ? ?Plan Discussed with: CRNA, Anesthesiologist and Surgeon ? ?Anesthesia Plan Comments:   ? ? ? ? ? ? ?Anesthesia Quick Evaluation ? ?

## 2021-07-10 NOTE — Brief Op Note (Signed)
07/10/2021 ? ?4:58 PM ? ?PATIENT:  [name]  61 y.o. female ? ?PRE-OPERATIVE DIAGNOSIS:  Osteoarthritis of right knee joint, end stage ? ?POST-OPERATIVE DIAGNOSIS:  Osteoarthritis of right knee joint, end stage ? ?PROCEDURE:  Procedure(s): ?TOTAL KNEE ARTHROPLASTY (Right) DePuy Attune ? ?SURGEON:  Surgeon(s) and Role: ?   Netta Cedars, MD - Primary ? ?PHYSICIAN ASSISTANT:  ? ?ASSISTANTS: Ventura Bruns, PA-C  ? ?ANESTHESIA:   regional and spinal ? ?EBL:  minimal ? ?BLOOD ADMINISTERED:none ? ?DRAINS: none  ? ?LOCAL MEDICATIONS USED:  MARCAINE    ? ?SPECIMEN:  No Specimen ? ?DISPOSITION OF SPECIMEN:  N/A ? ?COUNTS:  YES ? ?TOURNIQUET:  * Missing tourniquet times found for documented tourniquets in log: [id number] * ? ?DICTATION: .Other Dictation: Dictation Number [id number] ? ?PLAN OF CARE: Admit to inpatient  ? ?PATIENT DISPOSITION:  PACU - hemodynamically stable. ?  ?Delay start of Pharmacological VTE agent (>24hrs) due to surgical blood loss or risk of bleeding: no ? ?

## 2021-07-10 NOTE — Discharge Instructions (Signed)
Ice to the knee constantly.  Keep the incision covered and clean and dry for one week, then ok to get it wet in the shower.  Do exercise as instructed every hour, please to prevent stiffness.    DO NOT prop anything under the knee, it will make your knee stiff.  Prop under the ankle to encourage your knee to go straight.   Use the walker while you are up and around for balance.  Wear your support stockings 24/7 to prevent blood clots and take baby aspirin twice daily for 30 days also to prevent blood clots  Follow up with Dr Veverly Fells in two weeks in the office, call [phone] for appt   INSTRUCTIONS AFTER JOINT REPLACEMENT   Remove items at home which could result in a fall. This includes throw rugs or furniture in walking pathways ICE to the affected joint every three hours while awake for 30 minutes at a time, for at least the first 3-5 days, and then as needed for pain and swelling.  Continue to use ice for pain and swelling. You may notice swelling that will progress down to the foot and ankle.  This is normal after surgery.  Elevate your leg when you are not up walking on it.   Continue to use the breathing machine you got in the hospital (incentive spirometer) which will help keep your temperature down.  It is common for your temperature to cycle up and down following surgery, especially at night when you are not up moving around and exerting yourself.  The breathing machine keeps your lungs expanded and your temperature down.   DIET:  As you were doing prior to hospitalization, we recommend a well-balanced diet.  DRESSING / WOUND CARE / SHOWERING  You may change your dressing 3-5 days after surgery.  Then change the dressing every day with sterile gauze.  Please use good hand washing techniques before changing the dressing.  Do not use any lotions or creams on the incision until instructed by your surgeon.  ACTIVITY  Increase activity slowly as tolerated, but follow the weight  bearing instructions below.   No driving for 6 weeks or until further direction given by your physician.  You cannot drive while taking narcotics.  No lifting or carrying greater than 10 lbs. until further directed by your surgeon. Avoid periods of inactivity such as sitting longer than an hour when not asleep. This helps prevent blood clots.  You may return to work once you are authorized by your doctor.     WEIGHT BEARING   Weight bearing as tolerated with assist device (walker, cane, etc) as directed, use it as long as suggested by your surgeon or therapist, typically at least 4-6 weeks.   EXERCISES  Results after joint replacement surgery are often greatly improved when you follow the exercise, range of motion and muscle strengthening exercises prescribed by your doctor. Safety measures are also important to protect the joint from further injury. Any time any of these exercises cause you to have increased pain or swelling, decrease what you are doing until you are comfortable again and then slowly increase them. If you have problems or questions, call your caregiver or physical therapist for advice.   Rehabilitation is important following a joint replacement. After just a few days of immobilization, the muscles of the leg can become weakened and shrink (atrophy).  These exercises are designed to build up the tone and strength of the thigh and leg muscles and to  improve motion. Often times heat used for twenty to thirty minutes before working out will loosen up your tissues and help with improving the range of motion but do not use heat for the first two weeks following surgery (sometimes heat can increase post-operative swelling).   These exercises can be done on a training (exercise) mat, on the floor, on a table or on a bed. Use whatever works the best and is most comfortable for you.    Use music or television while you are exercising so that the exercises are a pleasant break in your day.  This will make your life better with the exercises acting as a break in your routine that you can look forward to.   Perform all exercises about fifteen times, three times per day or as directed.  You should exercise both the operative leg and the other leg as well.  Exercises include:   Quad Sets - Tighten up the muscle on the front of the thigh (Quad) and hold for 5-10 seconds.   Straight Leg Raises - With your knee straight (if you were given a brace, keep it on), lift the leg to 60 degrees, hold for 3 seconds, and slowly lower the leg.  Perform this exercise against resistance later as your leg gets stronger.  Leg Slides: Lying on your back, slowly slide your foot toward your buttocks, bending your knee up off the floor (only go as far as is comfortable). Then slowly slide your foot back down until your leg is flat on the floor again.  Angel Wings: Lying on your back spread your legs to the side as far apart as you can without causing discomfort.  Hamstring Strength:  Lying on your back, push your heel against the floor with your leg straight by tightening up the muscles of your buttocks.  Repeat, but this time bend your knee to a comfortable angle, and push your heel against the floor.  You may put a pillow under the heel to make it more comfortable if necessary.   A rehabilitation program following joint replacement surgery can speed recovery and prevent re-injury in the future due to weakened muscles. Contact your doctor or a physical therapist for more information on knee rehabilitation.    CONSTIPATION  Constipation is defined medically as fewer than three stools per week and severe constipation as less than one stool per week.  Even if you have a regular bowel pattern at home, your normal regimen is likely to be disrupted due to multiple reasons following surgery.  Combination of anesthesia, postoperative narcotics, change in appetite and fluid intake all can affect your bowels.   YOU MUST  use at least one of the following options; they are listed in order of increasing strength to get the job done.  They are all available over the counter, and you may need to use some, POSSIBLY even all of these options:    Drink plenty of fluids (prune juice may be helpful) and high fiber foods Colace 100 mg by mouth twice a day  Senokot for constipation as directed and as needed Dulcolax (bisacodyl), take with full glass of water  Miralax (polyethylene glycol) once or twice a day as needed.  If you have tried all these things and are unable to have a bowel movement in the first 3-4 days after surgery call either your surgeon or your primary doctor.    If you experience loose stools or diarrhea, hold the medications until you stool forms back  up.  If your symptoms do not get better within 1 week or if they get worse, check with your doctor.  If you experience "the worst abdominal pain ever" or develop nausea or vomiting, please contact the office immediately for further recommendations for treatment.   ITCHING:  If you experience itching with your medications, try taking only a single pain pill, or even half a pain pill at a time.  You can also use Benadryl over the counter for itching or also to help with sleep.   TED HOSE STOCKINGS:  Use stockings on both legs until for at least 2 weeks or as directed by physician office. They may be removed at night for sleeping.  MEDICATIONS:  See your medication summary on the After Visit Summary that nursing will review with you.  You may have some home medications which will be placed on hold until you complete the course of blood thinner medication.  It is important for you to complete the blood thinner medication as prescribed.  PRECAUTIONS:  If you experience chest pain or shortness of breath - call 911 immediately for transfer to the hospital emergency department.   If you develop a fever greater that 101 F, purulent drainage from wound, increased  redness or drainage from wound, foul odor from the wound/dressing, or calf pain - CONTACT YOUR SURGEON.                                                   FOLLOW-UP APPOINTMENTS:  If you do not already have a post-op appointment, please call the office for an appointment to be seen by your surgeon.  Guidelines for how soon to be seen are listed in your After Visit Summary, but are typically between 1-4 weeks after surgery.  OTHER INSTRUCTIONS:   Knee Replacement:  Do not place pillow under knee, focus on keeping the knee straight while resting. CPM instructions: 0-90 degrees, 2 hours in the morning, 2 hours in the afternoon, and 2 hours in the evening. Place foam block, curve side up under heel at all times except when in CPM or when walking.  DO NOT modify, tear, cut, or change the foam block in any way.  POST-OPERATIVE OPIOID TAPER INSTRUCTIONS: It is important to wean off of your opioid medication as soon as possible. If you do not need pain medication after your surgery it is ok to stop day one. Opioids include: Codeine, Hydrocodone(Norco, Vicodin), Oxycodone(Percocet, oxycontin) and hydromorphone amongst others.  Long term and even short term use of opiods can cause: Increased pain response Dependence Constipation Depression Respiratory depression And more.  Withdrawal symptoms can include Flu like symptoms Nausea, vomiting And more Techniques to manage these symptoms Hydrate well Eat regular healthy meals Stay active Use relaxation techniques(deep breathing, meditating, yoga) Do Not substitute Alcohol to help with tapering If you have been on opioids for less than two weeks and do not have pain than it is ok to stop all together.  Plan to wean off of opioids This plan should start within one week post op of your joint replacement. Maintain the same interval or time between taking each dose and first decrease the dose.  Cut the total daily intake of opioids by one tablet each  day Next start to increase the time between doses. The last dose that should be eliminated is  the evening dose.   MAKE SURE YOU:  Understand these instructions.  Get help right away if you are not doing well or get worse.    Thank you for letting us be a part of your medical care team.  It is a privilege we respect greatly.  We hope these instructions will help you stay on track for a fast and full recovery!    ,

## 2021-07-10 NOTE — Care Plan (Signed)
Ortho Bundle Case Management Note ? ?Patient Details  ?Name: [name] ?MRN: [id number] ?Date of Birth: [birth year] ? ?                ?R TKA on 07/10/21. DCP: Home with husband. DME: No needs. Has a RW and 3-in-1. PT: Forestine Na 3/7. ? ? ?DME Arranged:  N/A ?DME Agency:    ? ?HH Arranged:    ?[place] Agency:    ? ?Additional Comments: ?Please contact me with any questions of if this plan should need to change. ? ?Larwance Rote  [phone] ?07/10/2021, 2:33 PM ?  ?

## 2021-07-10 NOTE — Progress Notes (Signed)
Patient does not want to wear cpap tonight. Patient on 2 L [place] ?

## 2021-07-10 NOTE — Plan of Care (Signed)
  Problem: Education: Goal: Knowledge of General Education information will improve Description: Including pain rating scale, medication(s)/side effects and non-pharmacologic comfort measures Outcome: Progressing   Problem: Activity: Goal: Risk for activity intolerance will decrease Outcome: Progressing   Problem: Pain Managment: Goal: General experience of comfort will improve Outcome: Progressing   

## 2021-07-10 NOTE — Anesthesia Procedure Notes (Signed)
Procedure Name: [place] ?Date/Time: 07/10/2021 2:57 PM ?Performed by: Lollie Sails, CRNA ?Pre-anesthesia Checklist: Patient identified, Emergency Drugs available, Suction available, Patient being monitored and Timeout performed ?Oxygen Delivery Method: Simple face mask ?Placement Confirmation: positive ETCO2 ? ? ? ? ?

## 2021-07-10 NOTE — Interval H&P Note (Signed)
History and Physical Interval Note: ? ?07/10/2021 ?2:07 PM ? ?[name]  has presented today for surgery, with the diagnosis of Osteoarthritis of right knee joint.  The various methods of treatment have been discussed with the patient and family. After consideration of risks, benefits and other options for treatment, the patient has consented to  Procedure(s): ?TOTAL KNEE ARTHROPLASTY (Right) as a surgical intervention.  The patient's history has been reviewed, patient examined, no change in status, stable for surgery.  I have reviewed the patient's chart and labs.  Questions were answered to the patient's satisfaction.   ? ? ?Augustin Schooling ? ? ?

## 2021-07-10 NOTE — Transfer of Care (Signed)
Immediate Anesthesia Transfer of Care Note ? ?Patient: [name] ? ?Procedure(s) Performed: TOTAL KNEE ARTHROPLASTY (Right: Knee) ? ?Patient Location: PACU ? ?Anesthesia Type:MAC, Regional and Spinal ? ?Level of Consciousness: awake, alert  and oriented ? ?Airway & Oxygen Therapy: Patient Spontanous Breathing ? ?Post-op Assessment: Report given to RN and Post -op Vital signs reviewed and stable ? ?Post vital signs: Reviewed and stable ? ?Last Vitals:  ?Vitals Value Taken Time  ?BP 127/71 07/10/21 1730  ?Temp    ?Pulse 72 07/10/21 1731  ?Resp 13 07/10/21 1731  ?SpO2 91 % 07/10/21 1731  ?Vitals shown include unvalidated device data. ? ?Last Pain:  ?Vitals:  ? 07/10/21 1147  ?TempSrc: Oral  ?PainSc:   ?   ? ?  ? ?Complications: No notable events documented. ?

## 2021-07-10 NOTE — Progress Notes (Signed)
Orthopedic Tech Progress Note ?Patient Details:  ?[name] ?02/18/1961 ?[id number] ? ?CPM Right Knee ?CPM Right Knee: On ?Right Knee Flexion (Degrees): 60 ?Right Knee Extension (Degrees): 0 ? ?Post Interventions ?Patient Tolerated: Well ? ?Hendrix Yurkovich E Tiare Rohlman ?07/10/2021, 5:50 PM ? ?

## 2021-07-10 NOTE — Anesthesia Procedure Notes (Signed)
Spinal ? ?Patient location during procedure: OR ?Start time: 07/10/2021 2:57 PM ?End time: 07/10/2021 3:03 PM ?Reason for block: surgical anesthesia ?Staffing ?Performed: resident/CRNA  ?Resident/CRNA: Lollie Sails, CRNA ?Preanesthetic Checklist ?Completed: patient identified, IV checked, site marked, risks and benefits discussed, surgical consent, monitors and equipment checked, pre-op evaluation and timeout performed ?Spinal Block ?Patient position: sitting ?Prep: DuraPrep and site prepped and draped ?Patient monitoring: heart rate, continuous pulse ox, blood pressure and cardiac monitor ?Approach: midline ?Location: L3-4 ?Injection technique: single-shot ?Needle ?Needle type: Pencan  ?Needle gauge: 24 G ?Needle length: 10 cm ?Assessment ?Events: CSF return ?Additional Notes ?Expiration date on kit noted and within range.  Sterile prep and drape.    Local with Lidocaine 1%.  Good CSF flow noted with no heme or c/o paresthesia.   Patient tolerated well.   ? ? ? ?

## 2021-07-10 NOTE — Progress Notes (Signed)
Assisted Dr. Albertha Ghee with Right Knee Adductor Canal block. Side rails up, monitors on throughout procedure. See vital signs in flow sheet. Tolerated Procedure well. ? ?

## 2021-07-11 LAB — BASIC METABOLIC PANEL
Anion gap: 7 (ref 5–15)
BUN: 17 mg/dL (ref 8–23)
CO2: 27 mmol/L (ref 22–32)
Calcium: 8.3 mg/dL — ABNORMAL LOW (ref 8.9–10.3)
Chloride: 101 mmol/L (ref 98–111)
Creatinine, Ser: 0.59 mg/dL (ref 0.44–1.00)
GFR, Estimated: >60 mL/min (ref >60)
Glucose, Bld: 168 mg/dL — ABNORMAL HIGH (ref 70–99)
Potassium: 3.7 mmol/L (ref 3.5–5.1)
Sodium: 135 mmol/L (ref 135–145)

## 2021-07-11 LAB — GLUCOSE, CAPILLARY
Glucose-Capillary: 170 mg/dL — ABNORMAL HIGH (ref 70–99)
Glucose-Capillary: 146 mg/dL — ABNORMAL HIGH (ref 70–99)
Glucose-Capillary: 154 mg/dL — ABNORMAL HIGH (ref 70–99)
Glucose-Capillary: 135 mg/dL — ABNORMAL HIGH (ref 70–99)

## 2021-07-11 LAB — CBC
HCT: 41.6 % (ref 36.0–46.0)
Hemoglobin: 13.4 g/dL (ref 12.0–15.0)
MCH: 28.5 pg (ref 26.0–34.0)
MCHC: 32.2 g/dL (ref 30.0–36.0)
MCV: 88.3 fL (ref 80.0–100.0)
Platelets: 201 10*3/uL (ref 150–400)
RBC: 4.71 MIL/uL (ref 3.87–5.11)
RDW: 13.1 % (ref 11.5–15.5)
WBC: 9.4 10*3/uL (ref 4.0–10.5)
nRBC: 0 % (ref 0.0–0.2)

## 2021-07-11 NOTE — Evaluation (Signed)
Physical Therapy Evaluation ?Patient Details ?Name: [name] ?MRN: [id number] ?DOB: [birth year] ?Today's Date: 07/11/2021 ? ?History of Present Illness ? 61 yo female s/p R TKA. PMH: L TKA, DM, neuropathy  ?Clinical Impression ? Pt is s/p TKA resulting in the deficits listed below (see PT Problem List).  Pt will benefit from skilled PT to increase their independence and safety with mobility to allow discharge to the venue listed below.  ?   ?   ? ?Recommendations for follow up therapy are one component of a multi-disciplinary discharge planning process, led by the attending physician.  Recommendations may be updated based on patient status, additional functional criteria and insurance authorization. ? ?Follow Up Recommendations Follow physician's recommendations for discharge plan and follow up therapies ? ?  ?Assistance Recommended at Discharge Intermittent Supervision/Assistance  ?Patient can return home with the following ? A little help with walking and/or transfers;Help with stairs or ramp for entrance;Assistance with cooking/housework ? ?  ?Equipment Recommendations None recommended by PT  ?Recommendations for Other Services ?    ?  ?Functional Status Assessment Patient has had a recent decline in their functional status and demonstrates the ability to make significant improvements in function in a reasonable and predictable amount of time.  ? ?  ?Precautions / Restrictions    ? ?  ? ?Mobility ? Bed Mobility ?Overal bed mobility: Needs Assistance ?Bed Mobility: Supine to Sit, Sit to Supine ?  ?  ?Supine to sit: Min assist ?Sit to supine: Min assist ?  ?General bed mobility comments: assist with RLE ?  ? ?Transfers ?Overall transfer level: Needs assistance ?Equipment used: Rolling walker (2 wheels) ?Transfers: Sit to/from Stand ?Sit to Stand: Min assist ?  ?  ?  ?  ?  ?General transfer comment: assist to rise and transition to RW ?  ? ?Ambulation/Gait ?Ambulation/Gait assistance: Min assist ?Gait Distance  (Feet): 4 Feet ?Assistive device: Rolling walker (2 wheels) ?Gait Pattern/deviations: Step-to pattern, Antalgic ?  ?  ?  ?General Gait Details: cues for  sequence, limited by pain ? ?Stairs ?  ?  ?  ?  ?  ? ?Wheelchair Mobility ?  ? ?Modified Rankin (Stroke Patients Only) ?  ? ?  ? ?Balance   ?  ?  ?  ?  ?  ?  ?  ?  ?  ?  ?  ?  ?  ?  ?  ?  ?  ?  ?   ? ? ? ?Pertinent Vitals/Pain Pain Assessment ?Pain Assessment: 0-10 ?Pain Score: 5  ?Pain Location: R knee ?Pain Descriptors / Indicators: Aching, Sore ?Pain Intervention(s): Limited activity within patient's tolerance, Monitored during session, Premedicated before session  ? ? ?Home Living   ?  ?  ?  ?  ?  ?  ?  ?  ?  ?   ?  ?Prior Function   ?  ?  ?  ?  ?  ?  ?  ?  ?  ? ? ?Hand Dominance  ?   ? ?  ?Extremity/Trunk Assessment  ? Upper Extremity Assessment ?Upper Extremity Assessment: Overall WFL for tasks assessed ?  ? ?Lower Extremity Assessment ?Lower Extremity Assessment: RLE deficits/detail ?RLE Deficits / Details: ankle WFL, knee and hip grossly 2+/5 ?RLE: Unable to fully assess due to pain ?  ? ?   ?Communication  ?    ?Cognition Arousal/Alertness: Awake/alert ?Behavior During Therapy: [facility] for tasks assessed/performed ?Overall Cognitive Status: Within Functional Limits for tasks assessed ?  ?  ?  ?  ?  ?  ?  ?  ?  ?  ?  ?  ?  ?  ?  ?  ?  ?  ?  ? ?  ?  General Comments   ? ?  ?Exercises Total Joint Exercises ?Ankle Circles/Pumps: AROM, Both, 10 reps  ? ?Assessment/Plan  ?  ?PT Assessment Patient needs continued PT services  ?PT Problem List Decreased strength;Decreased mobility;Decreased range of motion;Decreased activity tolerance;Decreased balance;Decreased knowledge of use of DME;Pain ? ?   ?  ?PT Treatment Interventions DME instruction;Therapeutic exercise;Gait training;Stair training;Functional mobility training;Therapeutic activities;Patient/family education   ? ?PT Goals (Current goals can be found in the Care Plan section)  ?Acute Rehab PT Goals ?Patient  Stated Goal: home soon ?PT Goal Formulation: With patient ?Time For Goal Achievement: 07/25/21 ?Potential to Achieve Goals: Good ? ?  ?Frequency 7X/week ?  ? ? ?Co-evaluation   ?  ?  ?  ?  ? ? ?  ?AM-PAC PT "6 Clicks" Mobility  ?Outcome Measure Help needed turning from your back to your side while in a flat bed without using bedrails?: A Little ?Help needed moving from lying on your back to sitting on the side of a flat bed without using bedrails?: A Little ?Help needed moving to and from a bed to a chair (including a wheelchair)?: A Little ?Help needed standing up from a chair using your arms (e.g., wheelchair or bedside chair)?: A Little ?Help needed to walk in hospital room?: A Lot ?Help needed climbing 3-5 steps with a railing? : A Lot ?6 Click Score: 16 ? ?  ?End of Session Equipment Utilized During Treatment: Gait belt ?Activity Tolerance: Patient limited by pain ?Patient left: in bed;with call bell/phone within reach;with bed alarm set ?  ?PT Visit Diagnosis: Other abnormalities of gait and mobility (R26.89);Difficulty in walking, not elsewhere classified (R26.2) ?  ? ?Time: 9528-4132 ?PT Time Calculation (min) (ACUTE ONLY): 25 min ? ? ?Charges:   PT Evaluation ?$PT Eval Low Complexity: 1 Low ?PT Treatments ?$Gait Training: 8-22 mins ?  ?   ? ? ?[name], PT ? ?Acute Rehab Dept [facility]) [phone] ?Pager [phone] ? ?07/11/2021 ? ? ?[name] ?07/11/2021, 4:10 PM ? ?

## 2021-07-11 NOTE — Progress Notes (Signed)
PT NOTE ? 07/11/21 1611  ?PT Visit Information  ?Last PT Received On 07/11/21 ? ?Pt pain improved, requested to amb in room and then to bathroom. After pt seated on BSC she complained of dizziness, pt appeared diaphoretic, NT brought dinamap to room, pt BP 54/37; HR and O2 sats WNL; RN made aware  ?Assistance Needed +1  ?History of Present Illness 61 yo female s/p R TKA. PMH: L TKA, DM, neuropathy  ?Subjective Data  ?Patient Stated Goal home soon  ?Precautions  ?Precautions Knee  ?Restrictions  ?Weight Bearing Restrictions No  ?RLE Weight Bearing WBAT  ?Pain Assessment  ?Pain Assessment 0-10  ?Pain Score 4  ?Pain Location R knee  ?Pain Descriptors / Indicators Aching;Sore  ?Pain Intervention(s) Limited activity within patient's tolerance;Monitored during session;Premedicated before session;Repositioned  ?Cognition  ?Arousal/Alertness Awake/alert  ?Behavior During Therapy [facility] for tasks assessed/performed  ?Overall Cognitive Status Within Functional Limits for tasks assessed  ?Bed Mobility  ?Overal bed mobility Needs Assistance  ?Bed Mobility Supine to Sit  ?Supine to sit Min assist  ?General bed mobility comments assist with RLE  ?Transfers  ?Overall transfer level Needs assistance  ?Equipment used Rolling walker (2 wheels)  ?Transfers Sit to/from Stand  ?Sit to Stand Min assist  ?General transfer comment assist to rise and transition to RW  ?Ambulation/Gait  ?Ambulation/Gait assistance Min assist  ?Gait Distance (Feet) 25 Feet  ?Assistive device Rolling walker (2 wheels)  ?Gait Pattern/deviations Step-to pattern;Antalgic  ?General Gait Details cues for  sequence,to keep bil hands on RW,  limited by pain  ?Total Joint Exercises  ?Ankle Circles/Pumps AROM;Both;10 reps  ?PT - End of Session  ?Equipment Utilized During Treatment Gait belt  ?Activity Tolerance Patient limited by pain;Other (comment) ?(diaphoresis)  ?Patient left in chair;with call bell/phone within reach;Other (comment) ?(no alarm, had to imminently get  to chair from [facility] d/t dizziness and decr BP)  ?Nurse Communication Mobility status  ? PT - Assessment/Plan  ?PT Plan Current plan remains appropriate  ?PT Visit Diagnosis Other abnormalities of gait and mobility (R26.89);Difficulty in walking, not elsewhere classified (R26.2)  ?PT Frequency (ACUTE ONLY) 7X/week  ?Follow Up Recommendations Follow physician's recommendations for discharge plan and follow up therapies  ?Assistance recommended at discharge Intermittent Supervision/Assistance  ?Patient can return home with the following A little help with walking and/or transfers;Help with stairs or ramp for entrance;Assistance with cooking/housework  ?PT equipment None recommended by PT  ?AM-PAC PT "6 Clicks" Mobility Outcome Measure (Version 2)  ?Help needed turning from your back to your side while in a flat bed without using bedrails? 3  ?Help needed moving from lying on your back to sitting on the side of a flat bed without using bedrails? 3  ?Help needed moving to and from a bed to a chair (including a wheelchair)? 3  ?Help needed standing up from a chair using your arms (e.g., wheelchair or bedside chair)? 3  ?Help needed to walk in hospital room? 2  ?Help needed climbing 3-5 steps with a railing?  2  ?6 Click Score 16  ?Consider Recommendation of Discharge To: Home with HH  ?Progressive Mobility  ?What is the highest level of mobility based on the progressive mobility assessment? Level 5 (Walks with assist in room/hall) - Balance while stepping forward/back and can walk in room with assist - Complete  ?Activity Ambulated with assistance to bathroom  ?PT Goal Progression  ?Progress towards PT goals Progressing toward goals  ?Acute Rehab PT Goals  ?PT Goal Formulation With  patient  ?Time For Goal Achievement 07/25/21  ?Potential to Achieve Goals Good  ?PT Time Calculation  ?PT Start Time (ACUTE ONLY) 1515  ?PT Stop Time (ACUTE ONLY) 1544  ?PT Time Calculation (min) (ACUTE ONLY) 29 min  ?PT General Charges  ?$$  ACUTE PT VISIT 1 Visit  ?PT Treatments  ?$Gait Training 8-22 mins  ?$Therapeutic Activity 8-22 mins  ? ?

## 2021-07-11 NOTE — Plan of Care (Signed)
?  Problem: Pain Managment: Goal: General experience of comfort will improve Outcome: Progressing   Problem: Coping: Goal: Level of anxiety will decrease Outcome: Progressing   Problem: Safety: Goal: Ability to remain free from injury will improve Outcome: Progressing   

## 2021-07-11 NOTE — Progress Notes (Signed)
? ? ?  Subjective: ? ?Patient reports pain as mild to moderate.  Denies N/V/CP/SOB. No c/o. (+) flatus. ? ?Objective:  ? ?VITALS:   ?Vitals:  ? 07/10/21 1842 07/10/21 2213 07/11/21 0240 07/11/21 2179  ?BP: 129/74 (!) 153/83 (!) 145/84 (!) 147/90  ?Pulse: 68 78 78 80  ?Resp: $Remov'16 18 16 16  'WlGfqb$ ?Temp:  98.9 ?F (37.2 ?C) 98 ?F (36.7 ?C) 98 ?F (36.7 ?C)  ?TempSrc:  Oral Oral Oral  ?SpO2: 93% 95% 97% 96%  ?Weight:      ?Height:      ? ? ?NAD ?ABD soft ?Sensation intact distally ?Intact pulses distally ?Dorsiflexion/Plantar flexion intact ?Incision: dressing C/D/I ?Compartment soft ? ? ?Lab Results  ?Component Value Date  ? WBC 9.4 07/11/2021  ? HGB 13.4 07/11/2021  ? HCT 41.6 07/11/2021  ? MCV 88.3 07/11/2021  ? PLT 201 07/11/2021  ? ?BMET ?   ?Component Value Date/Time  ? NA 135 07/11/2021 0324  ? NA 140 06/15/2021 1026  ? K 3.7 07/11/2021 0324  ? CL 101 07/11/2021 0324  ? CO2 27 07/11/2021 0324  ? GLUCOSE 168 (H) 07/11/2021 0324  ? BUN 17 07/11/2021 0324  ? BUN 16 06/15/2021 1026  ? CREATININE 0.59 07/11/2021 0324  ? CREATININE 0.74 03/23/2011 0400  ? CALCIUM 8.3 (L) 07/11/2021 0324  ? EGFR 92 06/15/2021 1026  ? GFRNONAA >60 07/11/2021 0324  ? ? ? ?Assessment/Plan: ?1 Day Post-Op  ? ?Principal Problem: ?  Status post total knee replacement, right ? ? ?WBAT with walker ?DVT ppx: Aspirin, SCDs, TEDS ?PO pain control ?PT/OT ?Dispo: D/C home tomorrow with OPPT ? ? ?[place] Raymon Schlarb ?07/11/2021, 8:19 AM ? ? ?Rod Can, MD ?[phone] ?[place] [facility] [employer]  [place] ?[address], [place], [place] [address] ?Phone: [phone] ?[email or web address] ?Facebook  Engineer, structural  ?  ? ? ?

## 2021-07-11 NOTE — Plan of Care (Signed)
?  Problem: Education: ?Goal: Knowledge of General Education information will improve ?Description: Including pain rating scale, medication(s)/side effects and non-pharmacologic comfort measures ?07/11/2021 0751 by Olen Cordial, RN ?Outcome: Progressing ?07/10/2021 1839 by Olen Cordial, RN ?Outcome: Progressing ?  ?Problem: Activity: ?Goal: Risk for activity intolerance will decrease ?07/11/2021 0751 by Olen Cordial, RN ?Outcome: Progressing ?07/10/2021 1839 by Olen Cordial, RN ?Outcome: Progressing ?  ?Problem: Pain Managment: ?Goal: General experience of comfort will improve ?07/11/2021 0751 by Olen Cordial, RN ?Outcome: Progressing ?07/10/2021 1839 by Olen Cordial, RN ?Outcome: Progressing ?  ?

## 2021-07-11 NOTE — Op Note (Signed)
NAME: [name], Samantha P. ?MEDICAL RECORD NO: [id number] ?ACCOUNT NO: [id number] ?DATE OF BIRTH: [birth year] ?FACILITY: [facility] ?LOCATION: [facility] ?PHYSICIAN: Doran Heater. Veverly Fells, MD ? ?Operative Report  ? ?DATE OF PROCEDURE: 07/10/2021 ? ? ?PREOPERATIVE DIAGNOSIS:  Right knee end-stage arthritis. ? ?POSTOPERATIVE DIAGNOSIS:  Right knee end-stage arthritis. ? ?PROCEDURE PERFORMED:  Right total knee arthroplasty using DePuy Attune prosthesis. ? ?ATTENDING SURGEON:  Doran Heater. Veverly Fells, MD. ? ?ASSISTANT:  Darol Destine, [place], who was scrubbed during the entire procedure, and necessary for satisfactory completion of surgery. ? ?ANESTHESIA:  Spinal anesthesia was used plus adductor canal block. ? ?ESTIMATED BLOOD LOSS:  Minimal. ? ?FLUID REPLACEMENT:  1500 mL crystalloid. ? ?Instrument counts correct.  No complications.  Perioperative antibiotics were given. ? ?TOURNIQUET TIME:  1 hour and 25 minutes at 325 mmHg. ? ?INDICATIONS:  The patient is a 61 year old female with end stage bilateral knee OA, status post left knee replacement, doing well.  The patient has progressive pain in her right knee that show severe bone on bone and varus malalignment.  Given the  ?interference with ADLs and failure of conservative treatment, we discussed options and she wanted to move forward with right knee replacement.  Risks including but not limited to infection and blood clots and the patient wished to move forward with  ?surgery.  Informed consent obtained. ? ?DESCRIPTION OF PROCEDURE:  After an adequate level of spinal anesthesia was achieved, the patient was positioned supine on the operating room table.  Right leg correctly identified.  Nonsterile Tourniquet placed on the proximal thigh.  Right leg  ?sterilely prepped and draped in the usual manner.  Timeout called, verifying correct patient, correct site, we elevated the leg and exsanguinated with an Esmarch bandage and inflated the tourniquet to 325 mmHg, we placed the knee in flexion and  performed ? a longitudinal midline incision with a 10 blade scalpel.  Dissection down through subcutaneous tissues.  We used a fresh 10 blade for the medial parapatellar arthrotomy.  We then divided lateral patellofemoral ligaments everting the patella and  ?retracting, so we could visualize the distal femur.  There was no cartilage remaining on the distal femur with the proximal tibia consistent with end-stage arthritis.  We entered the distal femur with a step cut drill.  We then placed an intramedullary  ?resection guide and resected 10 mm off the distal femur for slight flexion contracture set on 5 degrees of valgus.  Once we had our distal cut performed, we performed our anterior, posterior chamfer cuts with the 4-in-1 block.  After referencing off the  ?anterior cortex and sizing the femur to a size 6 anterior down.  Once we had our jig in place, we made this cut.  We then resected ACL, PCL meniscal tissue subluxing the tibia anteriorly.  We then performed our tibial cut using the external jig.  We were ? 90 degrees perpendicular to the long axis with the tibia with minimal posterior slope using the oscillating saw.  Once we had her tibial cut performed, we used a lamina spreader, removed posterior femoral condyle osteophytes with the 3/4 inch curved  ?osteotome.  We also released the posterior capsule off the back of the femur.  We then injected the posterior capsule with a combination of Marcaine, Exparel and saline.  Once we had that cleared out on both sides, we checked our flexion and extension  ?gaps, which were symmetric at 5 mm.  We then removed our pins from our tibia completed our tibial  preparation with the modular drill and keel punch for the size 5 tibia.  We externally rotated the component as much as possible to assist with patellar  ?tracking.  Once we had our tibial component in place, we did our box cut for our femur and selected the 6 narrow femur trial, impacted that in place, reduced the  knee with a size 5 poly.  We then placed the knee in extension.  We resurfaced the patella  ?going from 25 mm thickness down to 15 mm thickness with the oscillating saw using the patellar cutting guide.  We removed some osteophytes off the periphery of the patella.  We then drilled our lug holes for the 35 patellar button.  We also drilled our  ?lug holes for the femur.  Next, we removed all trial components.  We irrigated thoroughly and then dried the bone well.  We vacuum mixed high viscosity cement on the back table and then cemented the components into place, 5 tibia, 6 right narrow femur.   ?We used a 5 spacer and then placed the knee in full extension allowed the cement to harden, we placed a 35 patellar button on the patella and held in place with a patellar clamp until cement was hard. We then removed all excess cement with quarter-inch  ?curved osteotome and pickups.  We inspected in the back of the knee and posterior to the tibia.  Once we had inspected thoroughly, we irrigated thoroughly.  We selected the real 6 mm size 6 poly placed on the tibial tray and reduced the knee.  We had a  ?nice little pop as that medial condyle reduced.  We were still able to get full extension, excellent flexion stability.  We then injected the anterior capsule and anterior parapatellar tissues with combination of Marcaine, Exparel and saline before  ?closing, we then went ahead and closed with #1 Vicryl suture for the parapatellar arthrotomy followed by 0 and 2-0 layered subcutaneous closure and 4-0 Monocryl for skin.  Steri-Strips were applied followed by sterile dressing.  The patient tolerated  ?surgery well.  ? ? ? ?[place] ?D: 07/10/2021 5:05:58 pm T: 07/11/2021 12:02:00 am  ?JOB: [id number]/ [id number]  ?

## 2021-07-11 NOTE — Plan of Care (Signed)
  Problem: Education: Goal: Knowledge of General Education information will improve Description Including pain rating scale, medication(s)/side effects and non-pharmacologic comfort measures Outcome: Progressing   

## 2021-07-12 LAB — CBC
HCT: 36.1 % (ref 36.0–46.0)
Hemoglobin: 11.5 g/dL — ABNORMAL LOW (ref 12.0–15.0)
MCH: 28.2 pg (ref 26.0–34.0)
MCHC: 31.9 g/dL (ref 30.0–36.0)
MCV: 88.5 fL (ref 80.0–100.0)
Platelets: 173 10*3/uL (ref 150–400)
RBC: 4.08 MIL/uL (ref 3.87–5.11)
RDW: 13.3 % (ref 11.5–15.5)
WBC: 7.6 10*3/uL (ref 4.0–10.5)
nRBC: 0 % (ref 0.0–0.2)

## 2021-07-12 LAB — GLUCOSE, CAPILLARY
Glucose-Capillary: 151 mg/dL — ABNORMAL HIGH (ref 70–99)
Glucose-Capillary: 134 mg/dL — ABNORMAL HIGH (ref 70–99)
Glucose-Capillary: 183 mg/dL — ABNORMAL HIGH (ref 70–99)

## 2021-07-12 NOTE — Plan of Care (Signed)
  Problem: Education: Goal: Knowledge of General Education information will improve Description: Including pain rating scale, medication(s)/side effects and non-pharmacologic comfort measures Outcome: Progressing   Problem: Activity: Goal: Risk for activity intolerance will decrease Outcome: Progressing   Problem: Pain Managment: Goal: General experience of comfort will improve Outcome: Progressing   

## 2021-07-12 NOTE — Progress Notes (Signed)
Physical Therapy Treatment ?Patient Details ?Name: [name] ?MRN: [id number] ?DOB: November 02, 1960 ?Today's Date: 07/12/2021 ? ? ?History of Present Illness 61 yo female s/p R TKA. PMH: L TKA, DM, neuropathy ? ?  ?PT Comments  ? ? Pt progressing well. [name] feels ready to go home today. No dizziness with amb. Ready to d/c from PT standpoint with family assist as needed.    ?Recommendations for follow up therapy are one component of a multi-disciplinary discharge planning process, led by the attending physician.  Recommendations may be updated based on patient status, additional functional criteria and insurance authorization. ? ?Follow Up Recommendations ? Follow physician's recommendations for discharge plan and follow up therapies ?  ?  ?Assistance Recommended at Discharge Intermittent Supervision/Assistance  ?Patient can return home with the following A little help with walking and/or transfers;Help with stairs or ramp for entrance;Assistance with cooking/housework ?  ?Equipment Recommendations ? None recommended by PT  ?  ?Recommendations for Other Services   ? ? ?  ?Precautions / Restrictions Precautions ?Precautions: Knee ?Restrictions ?Weight Bearing Restrictions: No ?RLE Weight Bearing: Weight bearing as tolerated  ?  ? ?Mobility ? Bed Mobility ?  ?  ?  ?  ?  ?  ?  ?General bed mobility comments: in recliner ?  ? ?Transfers ?Overall transfer level: Needs assistance ?Equipment used: Rolling walker (2 wheels) ?Transfers: Sit to/from Stand ?Sit to Stand: Min guard, Supervision ?  ?  ?  ?  ?  ?General transfer comment: cues for hand placement ?  ? ?Ambulation/Gait ?Ambulation/Gait assistance: Min guard, Supervision ?Gait Distance (Feet): 140 Feet ?Assistive device: Rolling walker (2 wheels) ?Gait Pattern/deviations: Step-to pattern, Decreased stance time - right ?  ?  ?  ?General Gait Details: cues for  sequence, RW position escpecially with turns. improvign wt shift to RLE, no knee buckling,  no  LOB ? ? ?Stairs ?Stairs:  (has ramp) ?  ?  ?  ?  ? ? ?Wheelchair Mobility ?  ? ?Modified Rankin (Stroke Patients Only) ?  ? ? ?  ?Balance   ?  ?  ?  ?  ?  ?  ?  ?  ?  ?  ?  ?  ?  ?  ?  ?  ?  ?  ?  ? ?  ?Cognition Arousal/Alertness: Awake/alert ?Behavior During Therapy: [facility] for tasks assessed/performed ?Overall Cognitive Status: Within Functional Limits for tasks assessed ?  ?  ?  ?  ?  ?  ?  ?  ?  ?  ?  ?  ?  ?  ?  ?  ?  ?  ?  ? ?  ?Exercises   ? ?  ?General Comments   ?  ?  ? ?Pertinent Vitals/Pain Pain Assessment ?Pain Assessment: 0-10 ?Pain Score: 4  ?Pain Location: R knee ?Pain Descriptors / Indicators: Aching, Sore ?Pain Intervention(s): Limited activity within patient's tolerance, Premedicated before session, Monitored during session, Repositioned  ? ? ?Home Living   ?  ?  ?  ?  ?  ?  ?  ?  ?  ?   ?  ?Prior Function    ?  ?  ?   ? ?PT Goals (current goals can now be found in the care plan section) Acute Rehab PT Goals ?Patient Stated Goal: home soon ?PT Goal Formulation: With patient ?Time For Goal Achievement: 07/25/21 ?Potential to Achieve Goals: Good ?Progress towards PT goals: Progressing toward goals ? ?  ?Frequency ? ? ?  7X/week ? ? ? ?  ?PT Plan Current plan remains appropriate  ? ? ?Co-evaluation   ?  ?  ?  ?  ? ?  ?AM-PAC PT "6 Clicks" Mobility   ?Outcome Measure ? Help needed turning from your back to your side while in a flat bed without using bedrails?: A Little ?Help needed moving from lying on your back to sitting on the side of a flat bed without using bedrails?: A Little ?Help needed moving to and from a bed to a chair (including a wheelchair)?: A Little ?Help needed standing up from a chair using your arms (e.g., wheelchair or bedside chair)?: A Little ?Help needed to walk in hospital room?: A Little ?Help needed climbing 3-5 steps with a railing? : A Little ?6 Click Score: 18 ? ?  ?End of Session Equipment Utilized During Treatment: Gait belt ?Activity Tolerance: Patient tolerated treatment  well ?Patient left: in chair;with call bell/phone within reach ?Nurse Communication: Mobility status ?PT Visit Diagnosis: Other abnormalities of gait and mobility (R26.89);Difficulty in walking, not elsewhere classified (R26.2) ?  ? ? ?Time: 3888-2800 ?PT Time Calculation (min) (ACUTE ONLY): 13 min ? ?Charges:  $Gait Training: 8-22 mins          ?          ? Baxter Flattery, PT ? ?Acute Rehab Dept [facility]) [phone] ?Pager [phone] ? ?07/12/2021 ? ? ? ?[name] ?07/12/2021, 10:18 AM ? ?

## 2021-07-12 NOTE — Progress Notes (Signed)
Patient has walker and commode at home already from prev. Surgery ? ?

## 2021-07-12 NOTE — Progress Notes (Signed)
Subjective: ?2 Days Post-Op Procedure(s) (LRB): ?TOTAL KNEE ARTHROPLASTY (Right) ?Patient reports pain as moderate.   ?Tolerating PO without N/V ?+void, +flatus ? ? ?Objective: ?Vital signs in last 24 hours: ?Temp:  [97.6 ?F (36.4 ?C)-99.5 ?F (37.5 ?C)] 98.2 ?F (36.8 ?C) (03/05 0533) ?Pulse Rate:  [84-86] 85 (03/05 0533) ?Resp:  [16-18] 18 (03/05 0533) ?BP: (111-151)/(63-90) 111/63 (03/05 0533) ?SpO2:  [92 %-97 %] 97 % (03/05 0533) ? ?Intake/Output from previous day: ?03/04 0701 - 03/05 0700 ?In: 937.5 [P.O.:480; I.V.:457.5] ?Out: 990 [Urine:990] ?Intake/Output this shift: ?No intake/output data recorded. ? ?Recent Labs  ?  07/11/21 ?0324 07/12/21 ?0310  ?HGB 13.4 11.5*  ? ?Recent Labs  ?  07/11/21 ?0324 07/12/21 ?0310  ?WBC 9.4 7.6  ?RBC 4.71 4.08  ?HCT 41.6 36.1  ?PLT 201 173  ? ?Recent Labs  ?  07/11/21 ?0324  ?NA 135  ?K 3.7  ?CL 101  ?CO2 27  ?BUN 17  ?CREATININE 0.59  ?GLUCOSE 168*  ?CALCIUM 8.3*  ? ?No results for input(s): LABPT, INR in the last 72 hours. ? ?Neurologically intact ?ABD soft ?Neurovascular intact ?Sensation intact distally ?Intact pulses distally ?Dorsiflexion/Plantar flexion intact ?Incision: dressing C/D/I ?No cellulitis present ?Compartment soft ? ? ?Assessment/Plan: ?2 Days Post-Op Procedure(s) (LRB): ?TOTAL KNEE ARTHROPLASTY (Right) ?Advance diet ?Up with therapy ?WBAT with walker ?DVT Ppx: ASA, teds, SCDs ? ?Plan D/C home today. ? ? ? ? ?[name] ?07/12/2021, 8:30 AM ? ?

## 2021-07-12 NOTE — Anesthesia Postprocedure Evaluation (Signed)
Anesthesia Post Note ? ?Patient: [name] ? ?Procedure(s) Performed: TOTAL KNEE ARTHROPLASTY (Right: Knee) ? ?  ? ?Patient location during evaluation: PACU ?Anesthesia Type: MAC, Regional and Spinal ?Level of consciousness: oriented and awake and alert ?Pain management: pain level controlled ?Vital Signs Assessment: post-procedure vital signs reviewed and stable ?Respiratory status: spontaneous breathing, respiratory function stable and patient connected to nasal cannula oxygen ?Cardiovascular status: blood pressure returned to baseline and stable ?Postop Assessment: no headache, no backache and no apparent nausea or vomiting ?Anesthetic complications: no ? ? ?No notable events documented. ? ?Last Vitals:  ?Vitals:  ? 07/11/21 2110 07/12/21 0533  ?BP: 117/70 111/63  ?Pulse: 84 85  ?Resp: 18 18  ?Temp: 37.4 ?C 36.8 ?C  ?SpO2: 92% 97%  ?  ?Last Pain:  ?Vitals:  ? 07/12/21 1100  ?TempSrc:   ?PainSc: 0-No pain  ? ? ?  ?  ?  ?  ?  ?  ? ?[place] S ? ? ? ? ?

## 2021-07-13 ENCOUNTER — Encounter (HOSPITAL_COMMUNITY): Payer: Self-pay | Admitting: Orthopedic Surgery

## 2021-07-13 ENCOUNTER — Other Ambulatory Visit: Payer: Self-pay

## 2021-07-14 ENCOUNTER — Encounter (HOSPITAL_COMMUNITY): Payer: Self-pay | Admitting: Physical Therapy

## 2021-07-14 ENCOUNTER — Other Ambulatory Visit: Payer: Self-pay

## 2021-07-14 ENCOUNTER — Ambulatory Visit (HOSPITAL_COMMUNITY): Payer: Medicare Other | Attending: Family Medicine | Admitting: Physical Therapy

## 2021-07-14 DIAGNOSIS — M25561 Pain in right knee: Secondary | ICD-10-CM | POA: Diagnosis not present

## 2021-07-14 DIAGNOSIS — R2689 Other abnormalities of gait and mobility: Secondary | ICD-10-CM | POA: Insufficient documentation

## 2021-07-14 NOTE — Therapy (Signed)
[place], [place], [address] Phone: [phone]   Fax:  [phone]  Physical Therapy Evaluation  Patient Details  Name: [name] MRN: [id number] Date of Birth: [birth year] Referring Provider (PT): Netta Cedars MD   Encounter Date: 07/14/2021   PT End of Session - 07/14/21 1559     Visit Number 1    Number of Visits 12    Date for PT Re-Evaluation 08/25/21    Authorization Type [employer] medicare no auth - med nec./ [employer] 2ndary    Progress Note Due on Visit 10    PT Start Time 1520    PT Stop Time 1605    PT Time Calculation (min) 45 min    Activity Tolerance Patient tolerated treatment well    Behavior During Therapy [facility] for tasks assessed/performed             Past Medical History:  Diagnosis Date   Adenomatous polyp 2009   Anxiety    Arthritis    Bipolar 1 disorder ([place])    Constipation    Depression    Diabetes mellitus    Diabetes mellitus, type II ([place])    Diverticula of colon 2009   Generalized headaches    GERD (gastroesophageal reflux disease)    History of hiatal hernia    small   Hypertension    no meds   Migraine    Neuropathy    both feet   Obesity    Pelvic floor dysfunction    abnormal anorectal manometry at [facility] in 2009   Plantar fasciitis both feet   Sleep apnea    cpap setting of 3.5    Past Surgical History:  Procedure Laterality Date   ABDOMINAL HYSTERECTOMY     compete   CATARACT EXTRACTION W/PHACO Right 02/15/2017   Procedure: CATARACT EXTRACTION PHACO AND INTRAOCULAR LENS PLACEMENT ([place]);  Surgeon: Rutherford Guys, MD;  Location: [facility];  Service: Ophthalmology;  Laterality: Right;  CDE: 4.18   CATARACT EXTRACTION W/PHACO Left 03/01/2017   Procedure: CATARACT EXTRACTION PHACO AND INTRAOCULAR LENS PLACEMENT ([facility]);  Surgeon: Rutherford Guys, MD;  Location: [facility];  Service: Ophthalmology;  Laterality: Left;  CDE: 2.56   COLON RESECTION    03/30/2002   with  end-colostomy and Hartmann's pouch   COLON SURGERY     complicated diverticulitis requiring sigmoid resection with colostomy and subsequent takedown   COLONOSCOPY  12/11/2007   Dr. Gala Romney- marginal prep, normal rectum pancolonic diverticula, adenomatous polyp   COLONOSCOPY  08/28/2003      Wide open colonic anastomosis/Scattered diverticula noted throughout colon/ Small external hemorrhoids   COLONOSCOPY  04/2012   [facility]: hyperplastic polyps, diverticulosis, ileocolonic anastomosis.   COLONOSCOPY WITH PROPOFOL N/A 06/07/2016   Procedure: COLONOSCOPY WITH PROPOFOL;  Surgeon: Daneil Dolin, MD;  Location: [facility];  Service: Endoscopy;  Laterality: N/A;  7:30 am   COLOSTOMY CLOSURE  07/10/2002   ESOPHAGOGASTRODUODENOSCOPY N/A 01/08/2020   Procedure: UPPER GASTROINTESTINAL ENDOSCOPY;  Surgeon: Alphonsa Overall, MD;  Location: [facility];  Service: General;  Laterality: N/A;   ESOPHAGOGASTRODUODENOSCOPY (EGD) WITH PROPOFOL N/A 08/23/2016   Procedure: ESOPHAGOGASTRODUODENOSCOPY (EGD) WITH PROPOFOL;  Surgeon: Alphonsa Overall, MD;  Location: [facility];  Service: General;  Laterality: N/A;   FLEXIBLE SIGMOIDOSCOPY  01/20/2012   RMR: incomplete/attempted colonoscopy. Inadequate prep precluded examination   HEEL SPUR SURGERY Left 09/11/2013   both have been done   KNEE ARTHROSCOPY  02/04/2004    left knee/partial medial  meniscectomy.   KNEE SURGERY     3 arthroscopic  2 on left 1 on right   LAPAROSCOPIC GASTRIC BANDING  2009   LAPAROSCOPIC GASTRIC SLEEVE RESECTION N/A 01/08/2020   Procedure: LAPAROSCOPIC SLEEVE GASTRECTOMY;  Surgeon: Alphonsa Overall, MD;  Location: [facility];  Service: General;  Laterality: N/A;   LAPAROSCOPIC SALPINGOOPHERECTOMY  03/30/2002   TOTAL KNEE ARTHROPLASTY Left 03/27/2021   Procedure: TOTAL KNEE ARTHROPLASTY;  Surgeon: Netta Cedars, MD;  Location: [facility];  Service: Orthopedics;  Laterality: Left;   TOTAL KNEE ARTHROPLASTY Right 07/10/2021   Procedure: TOTAL KNEE ARTHROPLASTY;   Surgeon: Netta Cedars, MD;  Location[facility];  Service: Orthopedics;  Laterality: Right;   TUBAL LIGATION      There were no vitals filed for this visit.    Subjective Assessment - 07/14/21 1531     Subjective Patient presents to therapy with complaint of RT knee pain s/p RT TKA on 07/10/21 She is doing well. States this surgery went better than the last. She has been doing home exercises without issue. Pain is well managed, currently taking pain medication, muscle relaxer and aspirin.    Pertinent History Rt TKA 07/10/21    Limitations Sitting;Lifting;Standing;Walking;House hold activities    Patient Stated Goals To not be hurting and lose 40-50 lbs, exercise more    Currently in Pain? Yes    Pain Score 7     Pain Location Knee    Pain Orientation Right    Pain Descriptors / Indicators Aching;Sore    Pain Type Surgical pain    Pain Onset In the past 7 days    Pain Frequency Intermittent    Aggravating Factors  WB, standing, bending, walking    Pain Relieving Factors meds, rest, ice    Effect of Pain on Daily Activities Limits                [facility] PT Assessment - 07/14/21 0001       Assessment   Medical Diagnosis RT TKA    Referring Provider (PT) Netta Cedars MD    Onset Date/Surgical Date 07/10/21    Prior Therapy yes      Precautions   Precautions None      Restrictions   Weight Bearing Restrictions No      Balance Screen   Has the patient fallen in the past 6 months No      [place] residence    Living Arrangements Spouse/significant other      Prior Function   Level of Independence Independent      Cognition   Overall Cognitive Status Within Functional Limits for tasks assessed      Observation/Other Assessments   Observations Post op bandages intact, very minimal spotting, no drainage, minimal redness    Focus on Therapeutic Outcomes (FOTO)  28% function      Observation/Other Assessments-Edema    Edema --   min/  moderate     Sensation   Light Touch Appears Intact      AROM   Right Knee Extension -10    Right Knee Flexion 78      Ambulation/Gait   Ambulation/Gait Yes    Ambulation/Gait Assistance 6: Modified independent (Device/Increase time)    Assistive device Rolling walker    Gait Pattern Decreased stride length;Decreased hip/knee flexion - right;Decreased step length - left;Decreased stance time - right;Antalgic    Ambulation Surface Level;Indoor  Objective measurements completed on examination: See above findings.       [facility] Adult PT Treatment/Exercise - 07/14/21 0001       Knee/Hip Exercises: Supine   Quad Sets 10 reps;Right    Heel Slides AROM;5 reps    Other Supine Knee/Hip Exercises glute set 5 x 5", ankle pumps x 5                     PT Education - 07/14/21 1534     Education Details on evaluation findings, POC and HEP    Person(s) Educated Patient    Methods Explanation;Handout    Comprehension Verbalized understanding              PT Short Term Goals - 07/14/21 1611       PT SHORT TERM GOAL #1   Title Patient will be independent with initial HEP and self-management strategies to improve functional outcomes    Time 3    Period Weeks    Status New    Target Date 07/28/21               PT Long Term Goals - 07/14/21 1611       PT LONG TERM GOAL #1   Title Patient will have RT knee AROM 0-120 degrees to improve functional mobility and facilitate squatting to pick up items from floor.    Time 6    Period Weeks    Status New    Target Date 08/25/21      PT LONG TERM GOAL #2   Title Patient will report at least 75% overall improvement in subjective complaint to indicate improvement in ability to perform ADLs.    Time 6    Period Weeks    Status New    Target Date 08/25/21      PT LONG TERM GOAL #3   Title Patient will improve FOTO score to predicted value to indicate improvement in functional  outcomes    Time 6    Period Weeks    Status New    Target Date 08/25/21      PT LONG TERM GOAL #4   Title Patient will be independent with advanced HEP and self-management strategies to improve functional outcomes    Time 6    Period Weeks    Status New    Target Date 08/25/21      PT LONG TERM GOAL #5   Title Patient will have RT knee MMT equal to or > 4+/5 MMT for improved ability to perform functional mobility, stair ambulation and ADLs.    Time 6    Period Weeks    Status New    Target Date 08/25/21                    Plan - 07/14/21 1559     Clinical Impression Statement Patient is a 61 y.o. female who presents to physical therapy with complaint of Rt knee pain s/p Rt TKA. Patient demonstrates decreased strength, ROM restriction, increased tenderness to palpation and gait abnormalities which are likely contributing to symptoms of pain and are negatively impacting patient ability to perform ADLs and functional mobility tasks. Patient will benefit from skilled physical therapy services to address these deficits to reduce pain, improve level of function with ADLs and functional mobility tasks.    Examination-Activity Limitations Bend;Squat;Stairs;Stand;Carry;Transfers;Dressing;Hygiene/Grooming;Lift;Locomotion Level;Toileting;Sleep;Bed Mobility;Bathing;Sit    Examination-Participation Restrictions Community Activity;Shop;Cleaning;Yard Work;Meal Prep;Laundry;Driving    Stability/Clinical Decision Making Stable/Uncomplicated  Clinical Decision Making Low    Rehab Potential Good    PT Frequency 2x / week    PT Duration 6 weeks    PT Treatment/Interventions ADLs/Self Care Home Management;Aquatic Therapy;Fluidtherapy;Parrafin;Ultrasound;Neuromuscular re-education;Cognitive remediation;Contrast Bath;DME Instruction;Patient/family education;Biofeedback;Gait training;Stair training;Cryotherapy;Electrical Stimulation;Functional mobility training;Orthotic Fit/Training;Therapeutic  activities;Iontophoresis '4mg'$ /ml Dexamethasone;Manual techniques;Therapeutic exercise;Moist Heat;Traction;Balance training;Manual lymph drainage;Vasopneumatic Device;Taping;Splinting;Energy conservation;Joint Manipulations;Dry needling;Passive range of motion;Spinal Manipulations;Visual/perceptual remediation/compensation;Scar mobilization;Compression bandaging    PT Next Visit Plan Progress knee strength and mobility as tolerated. Manual for pain and swelling as indicated. Progress to gait and balance with LRAD.    PT Home Exercise Plan Eval: ankle pump, heel slide, quad set, glute set, heel prop    Consulted and Agree with Plan of Care Patient             Patient will benefit from skilled therapeutic intervention in order to improve the following deficits and impairments:  Abnormal gait, Hypomobility, Increased edema, Decreased activity tolerance, Decreased strength, Pain, Increased fascial restricitons, Decreased scar mobility, Decreased balance, Decreased mobility, Difficulty walking, Decreased range of motion, Impaired flexibility, Improper body mechanics  Visit Diagnosis: Right knee pain, unspecified chronicity  Other abnormalities of gait and mobility     Problem List Patient Active Problem List   Diagnosis Date Noted   Status post total knee replacement, right 07/10/2021   Vitamin D deficiency 07/02/2021   H/O total knee replacement, left 03/27/2021   Morbid obesity with BMI of 45.0-49.9, adult ([place]) 01/08/2020   Uncontrolled type 2 diabetes mellitus with hyperglycemia ([place]) 05/21/2019   Essential hypertension, benign 05/21/2019   Mixed hyperlipidemia 05/21/2019   Plantar fasciitis of right foot 06/14/2018   Osteoarthritis of left knee 04/05/2018   Osteoarthritis of right knee 04/05/2018   Hx of adenomatous colonic polyps 06/04/2016   LLQ pain 12/31/2014   Abdominal pain, RLQ (right lower quadrant) 04/24/2014   OSA on CPAP 10/18/2013   Alcohol abuse 01/01/2013   Migraine  without aura, with intractable migraine, so stated, without mention of status migrainosus 09/21/2012   Memory loss 09/21/2012   Lapband APL Nov 2009 06/21/2012   Outbursts of anger 03/31/2012   FH: colon cancer 11/03/2011   Rectal bleed 11/03/2011   Esophageal dysphagia 11/03/2011   COLONIC POLYPS 10/10/2008   DIABETES MELLITUS, TYPE II 09/02/2008   Obesity 09/02/2008   ANXIETY NEUROSIS 09/02/2008   Severe mood disorder without psychotic features ([place]) 09/02/2008   GERD 09/02/2008   Constipation 09/02/2008   OSTEOARTHRITIS 09/02/2008   4:17 PM, 07/14/21 Josue Hector PT DPT  Physical Therapist with [facility]  [phone] [place] [address] [place], [place], [address] Phone: [phone]   Fax:  [phone]  Name: SHIVA KARIS MRN: [id number] Date of Birth: [birth year]

## 2021-07-14 NOTE — Discharge Summary (Signed)
? ?In most cases prophylactic antibiotics for Dental procdeures after total joint surgery are not necessary. ? ?Exceptions are as follows: ? ?1. History of prior total joint infection ? ?2. Severely immunocompromised (Organ Transplant, cancer chemotherapy, Rheumatoid biologic ?meds such as [place]) ? ?3. Poorly controlled diabetes (A1C &gt; 8.0, blood glucose over 200) ? ?If you have one of these conditions, contact your surgeon for an antibiotic prescription, prior to your ?dental procedure. Orthopedic Discharge Summary ? ? ? ?  ? ? ?Physician Discharge Summary  ?Patient ID: ?[name] ?MRN: [id number] ?DOB/AGE: December 09, 1960 61 y.o. ? ?Admit date: 07/10/2021 ?Discharge date: 07/12/21 ? ?Procedures:  ?Procedure(s) (LRB): ?TOTAL KNEE ARTHROPLASTY (Right) ? ?Attending Physician:  Dr. Esmond Plants ? ?Admission Diagnoses:   right knee end stage osteoarthritis ? ?Discharge Diagnoses:  right knee end stage osteoarthritis ? ? ?Past Medical History:  ?Diagnosis Date  ? Adenomatous polyp 2009  ? Anxiety   ? Arthritis   ? Bipolar 1 disorder ([place])   ? Constipation   ? Depression   ? Diabetes mellitus   ? Diabetes mellitus, type II ([place])   ? Diverticula of colon 2009  ? Generalized headaches   ? GERD (gastroesophageal reflux disease)   ? History of hiatal hernia   ? small  ? Hypertension   ? no meds  ? Migraine   ? Neuropathy   ? both feet  ? Obesity   ? Pelvic floor dysfunction   ? abnormal anorectal manometry at [facility] in 2009  ? Plantar fasciitis both feet  ? Sleep apnea   ? cpap setting of 3.5  ? ? ?PCP: Sharilyn Sites, MD  ? ?Discharged Condition: good ? ?Hospital Course:  Patient underwent the above stated procedure on 07/10/2021. Patient tolerated the procedure well and brought to the recovery room in good condition and subsequently to the floor. Patient had an uncomplicated hospital course and was stable for discharge. ? ? ?Disposition: Discharge disposition: 01-Home or Self Care ? ? ? ? ? with follow up in 2 weeks ? ? ?  Follow-up Information   ? ? Netta Cedars, MD. Daphane Shepherd on 07/22/2021.   ?Specialty: Orthopedic Surgery ?Why: You are scheduled for first post op appointment on Wednesday March 15th at 9:30am. ?Contact information: ?[place] ?[address] ?[place] [address] ?[phone] ? ? ?  ?  ? ? Netta Cedars, MD. Call in 2 week(s).   ?Specialty: Orthopedic Surgery ?Why: call for appt in two weeks in the office ?Contact information: ?[place] ?[address] ?[place] [address] ?[phone] ? ? ?  ?  ? ?  ?  ? ?  ? ? ?Dental Antibiotics: ? ?In most cases prophylactic antibiotics for Dental procdeures after total joint surgery are not necessary. ? ?Exceptions are as follows: ? ?1. History of prior total joint infection ? ?2. Severely immunocompromised (Organ Transplant, cancer chemotherapy, Rheumatoid biologic ?meds such as [place]) ? ?3. Poorly controlled diabetes (A1C &gt; 8.0, blood glucose over 200) ? ?If you have one of these conditions, contact your surgeon for an antibiotic prescription, prior to your ?dental procedure. ? ? ? ?Allergies as of 07/12/2021   ? ?   Reactions  ? Bactrim Itching, Nausea And Vomiting, Rash  ? Bactrim brand name. Redness.  ? ?  ? ?  ?Medication List  ?  ? ?STOP taking these medications   ? ?HYDROcodone-acetaminophen 5-325 MG tablet ?Commonly known as: NORCO/VICODIN ?  ? ?  ? ?TAKE these medications   ? ?ALPRAZolam 1  MG tablet ?Commonly known as: Duanne [place] ?Take 1 tablet (1 mg total) by mouth 3 (three) times daily as needed for anxiety. ?  ?ARIPiprazole 10 MG tablet ?Commonly known as: ABILIFY ?Take 1 tablet (10 mg total) by mouth at bedtime. ?  ?ascorbic acid 500 MG tablet ?Commonly known as: VITAMIN C ?Take 500 mg by mouth daily. ?  ?aspirin 81 MG chewable tablet ?Commonly known as: Aspirin Childrens ?Chew 1 tablet (81 mg total) by mouth 2 (two) times daily. ?  ?B-D ULTRAFINE III SHORT PEN 31G X 8 MM Misc ?Generic drug: Insulin Pen Needle ?1 each by Does not apply route as directed. ?   ?CALCIUM-VITAMIN D-VITAMIN K PO ?Take 1 tablet by mouth daily. Chewable ?  ?cycloSPORINE 0.05 % ophthalmic emulsion ?Commonly known as: RESTASIS ?Place 2 drops into both eyes 2 (two) times daily. ?  ?diclofenac 75 MG EC tablet ?Commonly known as: VOLTAREN ?Take 75 mg by mouth 2 (two) times daily. ?  ?escitalopram 20 MG tablet ?Commonly known as: Lexapro ?Take 1 tablet (20 mg total) by mouth daily. ?  ?fluticasone 50 MCG/ACT nasal spray ?Commonly known as: FLONASE ?Place 2 sprays into the nose daily as needed for allergies. ?  ?FreeStyle [place]yer 2 Reader Kerrin Mo ?As directed ?  ?FreeStyle Libre 2 Sensor Misc ?1 Piece by Does not apply route every 14 (fourteen) days. ?  ?FreeStyle Lite Devi ?Use to measure glucose 4 times a day ?  ?gabapentin 100 MG capsule ?Commonly known as: NEURONTIN ?Take 1 capsule (100 mg total) by mouth 2 (two) times daily. ?  ?GLUCOSAMINE HCL PO ?Take 1 tablet by mouth daily. ?  ?glucose blood test strip ?Test 4 times a day, she has freestyle lite meter ?  ?ICY HOT EX ?Apply 1 application topically 4 (four) times daily as needed (joint pain.). ?  ?linaclotide 145 MCG Caps capsule ?Commonly known as: LINZESS ?Take 145 mcg by mouth daily before breakfast. ?  ?loratadine 10 MG tablet ?Commonly known as: CLARITIN ?Take 10 mg by mouth daily. ?  ?meclizine 25 MG tablet ?Commonly known as: ANTIVERT ?Take 25 mg by mouth 2 (two) times daily as needed for dizziness. ?  ?methocarbamol 500 MG tablet ?Commonly known as: ROBAXIN ?Take 1 tablet (500 mg total) by mouth every 6 (six) hours as needed for muscle spasms. ?What changed: when to take this ?  ?multivitamin with minerals Tabs tablet ?Take 1 tablet by mouth daily. celebrate multivitamin 2 in 1 ?  ?ondansetron 4 MG tablet ?Commonly known as: Zofran ?Take 1 tablet (4 mg total) by mouth every 8 (eight) hours as needed for nausea, vomiting or refractory nausea / vomiting. ?What changed: Another medication with the same name was added. Make sure you understand  how and when to take each. ?  ?ondansetron 4 MG tablet ?Commonly known as: Zofran ?Take 1 tablet (4 mg total) by mouth every 8 (eight) hours as needed for nausea, vomiting or refractory nausea / vomiting. ?What changed: You were already taking a medication with the same name, and this prescription was added. Make sure you understand how and when to take each. ?  ?oxyCODONE-acetaminophen 5-325 MG tablet ?Commonly known as: Percocet ?Take 1-2 tablets by mouth every 4 (four) hours as needed for severe pain. ?  ?pantoprazole 40 MG tablet ?Commonly known as: PROTONIX ?Take 1 tablet (40 mg total) by mouth 2 (two) times daily. ?What changed: when to take this ?  ?PROBIOTIC PO ?Take 1 tablet by mouth daily. ?  ?Propylene Glycol-Glycerin 0.6-0.6 %  Soln ?Place 1 drop into both eyes 2 (two) times daily. ?  ?rizatriptan 10 MG disintegrating tablet ?Commonly known as: MAXALT-MLT ?Take 10 mg by mouth as needed for migraine. May repeat in 2 hours if needed ?  ?temazepam 30 MG capsule ?Commonly known as: Restoril ?Take 1 capsule (30 mg total) by mouth at bedtime as needed for sleep. ?  ?Trulicity 1.5 BO/1.7PZ Sopn ?Generic drug: Dulaglutide ?INJECT 1.5 MG UNDER THE SKIN ONCE A WEEK ?  ? ?  ? ? ? ? ?Signed: ?Ventura Bruns ?07/14/2021, 10:02 AM ? ?[place] [facility] now [employer]  [place] ?[address] [place], [place], [place] [address] ?Phone: [phone] ?Facebook  Engineer, structural  ? ?  ?

## 2021-07-16 ENCOUNTER — Ambulatory Visit (HOSPITAL_COMMUNITY): Payer: Medicare Other

## 2021-07-16 ENCOUNTER — Encounter: Payer: Self-pay | Admitting: *Deleted

## 2021-07-16 ENCOUNTER — Telehealth (HOSPITAL_COMMUNITY): Payer: Self-pay | Admitting: Family Medicine

## 2021-07-16 DIAGNOSIS — R2689 Other abnormalities of gait and mobility: Secondary | ICD-10-CM

## 2021-07-16 DIAGNOSIS — M25561 Pain in right knee: Secondary | ICD-10-CM

## 2021-07-16 NOTE — Telephone Encounter (Signed)
Pt stated her car broke down on the way to her appointment ?

## 2021-07-21 ENCOUNTER — Encounter (HOSPITAL_COMMUNITY): Payer: Medicare Other

## 2021-07-22 DIAGNOSIS — Z4789 Encounter for other orthopedic aftercare: Secondary | ICD-10-CM | POA: Diagnosis not present

## 2021-07-23 ENCOUNTER — Encounter (HOSPITAL_COMMUNITY): Payer: Self-pay

## 2021-07-23 ENCOUNTER — Encounter (HOSPITAL_COMMUNITY): Payer: Medicare Other

## 2021-07-23 ENCOUNTER — Ambulatory Visit (HOSPITAL_COMMUNITY): Payer: Medicare Other

## 2021-07-23 ENCOUNTER — Other Ambulatory Visit: Payer: Self-pay

## 2021-07-23 DIAGNOSIS — M25561 Pain in right knee: Secondary | ICD-10-CM | POA: Diagnosis not present

## 2021-07-23 DIAGNOSIS — R2689 Other abnormalities of gait and mobility: Secondary | ICD-10-CM | POA: Diagnosis not present

## 2021-07-23 NOTE — Therapy (Signed)
?OUTPATIENT PHYSICAL THERAPY TREATMENT NOTE ? ? ?Patient Name: KATELIN KUTSCH ?MRN: [id number] ?DOB:03/01/1961, 61 y.o., female ?Today's Date: 07/23/2021 ? ?PCP: Sharilyn Sites, MD ?REFERRING PROVIDER: Sharilyn Sites, MD ? ? PT End of Session - 07/23/21 1256   ? ? Visit Number 2   ? Number of Visits 12   ? Date for PT Re-Evaluation 08/25/21   ? Authorization Type UHC medicare no auth - med nec./ [employer] 2ndary   ? Progress Note Due on Visit 10   ? PT Start Time 1300   ? PT Stop Time 1340   ? PT Time Calculation (min) 40 min   ? Activity Tolerance Patient tolerated treatment well   ? Behavior During Therapy [facility] for tasks assessed/performed   ? ?  ?  ? ?  ? ? ?Past Medical History:  ?Diagnosis Date  ? Adenomatous polyp 2009  ? Anxiety   ? Arthritis   ? Bipolar 1 disorder ([place])   ? Constipation   ? Depression   ? Diabetes mellitus   ? Diabetes mellitus, type II ([place])   ? Diverticula of colon 2009  ? Generalized headaches   ? GERD (gastroesophageal reflux disease)   ? History of hiatal hernia   ? small  ? Hypertension   ? no meds  ? Migraine   ? Neuropathy   ? both feet  ? Obesity   ? Pelvic floor dysfunction   ? abnormal anorectal manometry at [facility] in 2009  ? Plantar fasciitis both feet  ? Sleep apnea   ? cpap setting of 3.5  ? ?Past Surgical History:  ?Procedure Laterality Date  ? ABDOMINAL HYSTERECTOMY    ? compete  ? CATARACT EXTRACTION W/PHACO Right 02/15/2017  ? Procedure: CATARACT EXTRACTION PHACO AND INTRAOCULAR LENS PLACEMENT (IOC);  Surgeon: Rutherford Guys, MD;  [facility];  Service: Ophthalmology;  Laterality: Right;  CDE: 4.18  ? CATARACT EXTRACTION W/PHACO Left 03/01/2017  ? Procedure: CATARACT EXTRACTION PHACO AND INTRAOCULAR LENS PLACEMENT (IOC);  Surgeon: Rutherford Guys, MD;  Location: [facility];  Service: Ophthalmology;  Laterality: Left;  CDE: 2.56  ? COLON RESECTION    03/30/2002  ? with end-colostomy and Hartmann's pouch  ? COLON SURGERY    ? complicated diverticulitis requiring sigmoid resection with  colostomy and subsequent takedown  ? COLONOSCOPY  12/11/2007  ? Dr. Gala Romney- marginal prep, normal rectum pancolonic diverticula, adenomatous polyp  ? COLONOSCOPY  08/28/2003    ?  Wide open colonic anastomosis/Scattered diverticula noted throughout colon/ Small external hemorrhoids  ? COLONOSCOPY  04/2012  ? [facility]: hyperplastic polyps, diverticulosis, ileocolonic anastomosis.  ? COLONOSCOPY WITH PROPOFOL N/A 06/07/2016  ? Procedure: COLONOSCOPY WITH PROPOFOL;  Surgeon: Daneil Dolin, MD;  [facility];  Service: Endoscopy;  Laterality: N/A;  7:30 am  ? COLOSTOMY CLOSURE  07/10/2002  ? ESOPHAGOGASTRODUODENOSCOPY N/A 01/08/2020  ? Procedure: UPPER GASTROINTESTINAL ENDOSCOPY;  Surgeon: Alphonsa Overall, MD;  [facility];  Service: General;  Laterality: N/A;  ? ESOPHAGOGASTRODUODENOSCOPY (EGD) WITH PROPOFOL N/A 08/23/2016  ? Procedure: ESOPHAGOGASTRODUODENOSCOPY (EGD) WITH PROPOFOL;  Surgeon: Alphonsa Overall, MD;  [facility];  Service: General;  Laterality: N/A;  ? FLEXIBLE SIGMOIDOSCOPY  01/20/2012  ? RMR: incomplete/attempted colonoscopy. Inadequate prep precluded examination  ? HEEL SPUR SURGERY Left 09/11/2013  ? both have been done  ? KNEE ARTHROSCOPY  02/04/2004  ?  left knee/partial medial meniscectomy.  ? KNEE SURGERY    ? 3 arthroscopic  2 on left 1 on right  ?  LAPAROSCOPIC GASTRIC BANDING  2009  ? LAPAROSCOPIC GASTRIC SLEEVE RESECTION N/A 01/08/2020  ? Procedure: LAPAROSCOPIC SLEEVE GASTRECTOMY;  Surgeon: Alphonsa Overall, MD;  [facility];  Service: General;  Laterality: N/A;  ? LAPAROSCOPIC SALPINGOOPHERECTOMY  03/30/2002  ? TOTAL KNEE ARTHROPLASTY Left 03/27/2021  ? Procedure: TOTAL KNEE ARTHROPLASTY;  Surgeon: Netta Cedars, MD;  [facility];  Service: Orthopedics;  Laterality: Left;  ? TOTAL KNEE ARTHROPLASTY Right 07/10/2021  ? Procedure: TOTAL KNEE ARTHROPLASTY;  Surgeon: Netta Cedars, MD;  [facility];  Service: Orthopedics;  Laterality: Right;  ? TUBAL LIGATION    ? ?Patient  Active Problem List  ? Diagnosis Date Noted  ? Status post total knee replacement, right 07/10/2021  ? Vitamin D deficiency 07/02/2021  ? H/O total knee replacement, left 03/27/2021  ? Morbid obesity with BMI of 45.0-49.9, adult ([place]) 01/08/2020  ? Uncontrolled type 2 diabetes mellitus with hyperglycemia ([place]) 05/21/2019  ? Essential hypertension, benign 05/21/2019  ? Mixed hyperlipidemia 05/21/2019  ? Plantar fasciitis of right foot 06/14/2018  ? Osteoarthritis of left knee 04/05/2018  ? Osteoarthritis of right knee 04/05/2018  ? Hx of adenomatous colonic polyps 06/04/2016  ? LLQ pain 12/31/2014  ? Abdominal pain, RLQ (right lower quadrant) 04/24/2014  ? OSA on CPAP 10/18/2013  ? Alcohol abuse 01/01/2013  ? Migraine without aura, with intractable migraine, so stated, without mention of status migrainosus 09/21/2012  ? Memory loss 09/21/2012  ? Lapband APL Nov 2009 06/21/2012  ? Outbursts of anger 03/31/2012  ? FH: colon cancer 11/03/2011  ? Rectal bleed 11/03/2011  ? Esophageal dysphagia 11/03/2011  ? COLONIC POLYPS 10/10/2008  ? DIABETES MELLITUS, TYPE II 09/02/2008  ? Obesity 09/02/2008  ? ANXIETY NEUROSIS 09/02/2008  ? Severe mood disorder without psychotic features ([place]) 09/02/2008  ? GERD 09/02/2008  ? Constipation 09/02/2008  ? OSTEOARTHRITIS 09/02/2008  ? ? ?REFERRING DIAG: M17.11 unilateral primary osteoarthritis, rt knee per Esmond Plants, MD  ? ?THERAPY DIAG:  ?Right knee pain, unspecified chronicity ? ?Other abnormalities of gait and mobility ? ?PERTINENT HISTORY: Right TKA 07/10/21 ? ?PRECAUTIONS: none ? ?SUBJECTIVE: Had MD follow up this week went well, was concerned for infection but doctor felt was not, but over past weekend give antibiotics as precaution. She is struggling with sleep at night, constant pain. ? ?PAIN:  ?Are you having pain? Yes: NPRS scale: 10/10 ?Pain location: right knee ?Pain description: sharp, throbbing, burning ?Relieving factors: ice ? ? ? ? ?[facility] PT Assessment - 07/14/21 0001    ?  ?    ?     ?  Assessment  ?  Medical Diagnosis RT TKA   ?  Referring Provider (PT) Netta Cedars MD   ?  Onset Date/Surgical Date 07/10/21   ?  Prior Therapy yes   ?     ?  Precautions  ?  Precautions None   ?     ?  Restrictions  ?  Weight Bearing Restrictions No   ?     ?  Balance Screen  ?  Has the patient fallen in the past 6 months No   ?     ?  Home Environment  ?  Living Environment Private residence   ?  Living Arrangements Spouse/significant other   ?     ?  Prior Function  ?  Level of Independence Independent   ?     ?  Cognition  ?  Overall Cognitive Status Within Functional Limits for tasks assessed   ?     ?  Observation/Other Assessments  ?  Observations Post op bandages intact, very minimal spotting, no drainage, minimal redness   ?  Focus on Therapeutic Outcomes (FOTO)  28% function   ?     ?  Observation/Other Assessments-Edema   ?  Edema --   min/ moderate  ?     ?  Sensation  ?  Light Touch Appears Intact   ?     ?  AROM  ?  Right Knee Extension -10   ?  Right Knee Flexion 78   ?     ?  Ambulation/Gait  ?  Ambulation/Gait Yes   ?  Ambulation/Gait Assistance 6: Modified independent (Device/Increase time)   ?  Assistive device Rolling walker   ?  Gait Pattern Decreased stride length;Decreased hip/knee flexion - right;Decreased step length - left;Decreased stance time - right;Antalgic   ?  Ambulation Surface Level;Indoor   ?  ?   ?  ?  ?   ?  ?  ?  ?  ? Treatment today  ? 07/23/21 ?  Therapeutic exercises  ?   - HEP review give at evaluation  ?    Supine = ?    - Heel slides 2 x 10 with cue for slow and full ROM as able, especially pushing to straight and hold at flexion max ?    - Squat set 2 x 10 ?   - Glut set 2 x 10  ?   - ankle pump 2 x 10  ?   - Straight leg raise 2 x 8  cueing for core activation prior to lift and full extension as able - fatigue post  ?   - hamstring and IT band stretch with assist from DPT for elevation 2 x 45 seconds  ?    Seated =  ?    - Heel/toe raises 2 x 10  ?- Heel  slides 2 x 10 with cue for TKE on full extension as able with toes up ?    - LAQ 2 x 10 with cue for full extension and slow eccentric control ?    Standing =  At countertop in exam room ?    - R leg hi

## 2021-07-27 DIAGNOSIS — G4733 Obstructive sleep apnea (adult) (pediatric): Secondary | ICD-10-CM | POA: Diagnosis not present

## 2021-07-27 DIAGNOSIS — G4730 Sleep apnea, unspecified: Secondary | ICD-10-CM | POA: Diagnosis not present

## 2021-07-28 ENCOUNTER — Encounter (HOSPITAL_COMMUNITY): Payer: Self-pay

## 2021-07-28 ENCOUNTER — Other Ambulatory Visit: Payer: Self-pay

## 2021-07-28 ENCOUNTER — Ambulatory Visit (HOSPITAL_COMMUNITY): Payer: Medicare Other

## 2021-07-28 DIAGNOSIS — R2689 Other abnormalities of gait and mobility: Secondary | ICD-10-CM | POA: Diagnosis not present

## 2021-07-28 DIAGNOSIS — M25561 Pain in right knee: Secondary | ICD-10-CM | POA: Diagnosis not present

## 2021-07-28 NOTE — Therapy (Signed)
?OUTPATIENT PHYSICAL THERAPY TREATMENT NOTE ? ? ?Patient Name: [name] ?MRN: [id number] ?DOB:11-30-1960, 61 y.o., female ?Today's Date: 07/28/2021 ? ?PCP: Sharilyn Sites, MD ?REFERRING PROVIDER: Netta Cedars MD  ? ? PT End of Session - 07/28/21 1540   ? ? Visit Number 3   ? Number of Visits 12   ? Date for PT Re-Evaluation 08/25/21   ? Authorization Type UHC medicare no auth - med nec./ T[employer] 2ndary   ? Progress Note Due on Visit 10   ? PT Start Time 1533   ? PT Stop Time 1612   ? PT Time Calculation (min) 39 min   ? Activity Tolerance Patient tolerated treatment well   ? Behavior During Therapy [facility] for tasks assessed/performed   ? ?  ?  ? ?  ? ? ?Past Medical History:  ?Diagnosis Date  ? Adenomatous polyp 2009  ? Anxiety   ? Arthritis   ? Bipolar 1 disorder ([place])   ? Constipation   ? Depression   ? Diabetes mellitus   ? Diabetes mellitus, type II ([place])   ? Diverticula of colon 2009  ? Generalized headaches   ? GERD (gastroesophageal reflux disease)   ? History of hiatal hernia   ? small  ? Hypertension   ? no meds  ? Migraine   ? Neuropathy   ? both feet  ? Obesity   ? Pelvic floor dysfunction   ? [facility]orectal [facility] at [facility] in 2009  ? Plantar fasciitis both feet  ? Sleep apnea   ? cpap setting of 3.5  ? ?Past Surgical History:  ?Procedure Laterality Date  ? ABDOMINAL HYSTERECTOMY    ? compete  ? CATARACT EXTRACTION W/PHACO Right 02/15/2017  ? Procedure: CATARACT EXTRACTION PHACO AND INTRAOCULAR LENS PLACEMENT (IOC);  Surgeon: Rutherford Guys, MD;  [facility];  Service: Ophthalmology;  Laterality: Right;  CDE: 4.18  ? CATARACT EXTRACTION W/PHACO Left 03/01/2017  ? Procedure: CATARACT EXTRACTION PHACO AND INTRAOCULAR LENS PLACEMENT (IOC);  Surgeon: Rutherford Guys, MD;  [facility];  Service: Ophthalmology;  Laterality: Left;  CDE: 2.56  ? COLON RESECTION    03/30/2002  ? with end-colostomy and Hartmann's pouch  ? COLON SURGERY    ? complicated diverticulitis requiring sigmoid resection with  colostomy and subsequent takedown  ? COLONOSCOPY  12/11/2007  ? Dr. Gala Romney- marginal prep, normal rectum pancolonic diverticula, adenomatous polyp  ? COLONOSCOPY  08/28/2003    ?  Wide open colonic anastomosis/Scattered diverticula noted throughout colon/ Small external hemorrhoids  ? COLONOSCOPY  04/2012  ? [facility]: hyperplastic polyps, diverticulosis, ileocolonic anastomosis.  ? COLONOSCOPY WITH PROPOFOL N/A 06/07/2016  ? Procedure: COLONOSCOPY WITH PROPOFOL;  Surgeon: Daneil Dolin, MD;  [facility];  Service: Endoscopy;  Laterality: N/A;  7:30 am  ? COLOSTOMY CLOSURE  07/10/2002  ? ESOPHAGOGASTRODUODENOSCOPY N/A 01/08/2020  ? Procedure: UPPER GASTROINTESTINAL ENDOSCOPY;  Surgeon: Alphonsa Overall, MD;  [facility];  Service: General;  Laterality: N/A;  ? ESOPHAGOGASTRODUODENOSCOPY (EGD) WITH PROPOFOL N/A 08/23/2016  ? Procedure: ESOPHAGOGASTRODUODENOSCOPY (EGD) WITH PROPOFOL;  Surgeon: Alphonsa Overall, MD;  [facility];  Service: General;  Laterality: N/A;  ? FLEXIBLE SIGMOIDOSCOPY  01/20/2012  ? RMR: incomplete/attempted colonoscopy. Inadequate prep precluded examination  ? HEEL SPUR SURGERY Left 09/11/2013  ? both have been done  ? KNEE ARTHROSCOPY  02/04/2004  ?  left knee/partial medial meniscectomy.  ? KNEE SURGERY    ? 3 arthroscopic  2 on left 1 on right  ?  LAPAROSCOPIC GASTRIC BANDING  2009  ? LAPAROSCOPIC GASTRIC SLEEVE RESECTION N/A 01/08/2020  ? Procedure: LAPAROSCOPIC SLEEVE GASTRECTOMY;  Surgeon: Alphonsa Overall, MD;  [facility];  Service: General;  Laterality: N/A;  ? LAPAROSCOPIC SALPINGOOPHERECTOMY  03/30/2002  ? TOTAL KNEE ARTHROPLASTY Left 03/27/2021  ? Procedure: TOTAL KNEE ARTHROPLASTY;  Surgeon: Netta Cedars, MD;  [facility];  Service: Orthopedics;  Laterality: Left;  ? TOTAL KNEE ARTHROPLASTY Right 07/10/2021  ? Procedure: TOTAL KNEE ARTHROPLASTY;  Surgeon: Netta Cedars, MD;  [facility];  Service: Orthopedics;  Laterality: Right;  ? TUBAL LIGATION    ? ?Patient  Active Problem List  ? Diagnosis Date Noted  ? Status post total knee replacement, right 07/10/2021  ? Vitamin D deficiency 07/02/2021  ? H/O total knee replacement, left 03/27/2021  ? Morbid obesity with BMI of 45.0-49.9, adult ([place]) 01/08/2020  ? Uncontrolled type 2 diabetes mellitus with hyperglycemia ([place]) 05/21/2019  ? Essential hypertension, benign 05/21/2019  ? Mixed hyperlipidemia 05/21/2019  ? Plantar fasciitis of right foot 06/14/2018  ? Osteoarthritis of left knee 04/05/2018  ? Osteoarthritis of right knee 04/05/2018  ? Hx of adenomatous colonic polyps 06/04/2016  ? LLQ pain 12/31/2014  ? Abdominal pain, RLQ (right lower quadrant) 04/24/2014  ? OSA on CPAP 10/18/2013  ? Alcohol abuse 01/01/2013  ? Migraine without aura, with intractable migraine, so stated, without mention of status migrainosus 09/21/2012  ? Memory loss 09/21/2012  ? Lapband APL Nov 2009 06/21/2012  ? Outbursts of anger 03/31/2012  ? FH: colon cancer 11/03/2011  ? Rectal bleed 11/03/2011  ? Esophageal dysphagia 11/03/2011  ? COLONIC POLYPS 10/10/2008  ? DIABETES MELLITUS, TYPE II 09/02/2008  ? Obesity 09/02/2008  ? ANXIETY NEUROSIS 09/02/2008  ? Severe mood disorder without psychotic features ([place]) 09/02/2008  ? GERD 09/02/2008  ? Constipation 09/02/2008  ? OSTEOARTHRITIS 09/02/2008  ? ? ?REFERRING DIAG: M17.11 unilateral primary osteoarthritis, rt knee per Esmond Plants, MD  ? ?THERAPY DIAG:  ?Right knee pain, unspecified chronicity ? ?Other abnormalities of gait and mobility ? ?PERTINENT HISTORY: Right TKA 07/10/21 ? ?PRECAUTIONS: none ? 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  Wearing brace for support.  Increased pain 10/10 with some heat and edema present proximal knee ? ?PAIN:  ?Are you having pain? Yes: NPRS scale: 10/10 ?Pain location: right knee ?Pain description: sharp, throbbing, burning ?Relieving factors: ice ? ? ?  ?  ?  ? Treatment today  ?07/28/21: ?  ? ? 07/28/21  0001  ?Knee/Hip Exercises: Stretches  ?Knee: Self-Stretch to increase Flexion 5 reps;10 seconds  ?Knee: Self-Stretch Limitations knee drive on 65KP step  ?Knee/Hip Exercises: Aerobic  ?Stationary Bike Initial rocking then full revolution 5' seat 10  ?Knee/Hip Exercises: Standing  ?Gait Training 279f cueing to trunk rotation with UE swing  ?Knee/Hip Exercises: Supine  ?Quad Sets 10 reps;Right  ?Heel Slides AROM;10 reps  ?Straight Leg Raises Right;10 reps  ?Straight Leg Raises Limitations quad set prior raise  ?Knee Extension AROM  ?Knee Extension Limitations 6 lacking  ?Knee Flexion AROM  ?Knee Flexion Limitations 104  ?Short Arc QTarget CorporationRight;10 reps  ?Manual Therapy  ?Manual Therapy Edema management  ?Manual therapy comments Manual complete separate than rest of tx  ?Edema Management Decongestive techniques with LE elevated for edema control  ? ? ?     ? ?07/23/21 ?  Therapeutic exercises  ?   - HEP review give at evaluation  ?  Supine = ?    - Heel slides 2 x 10 with cue for slow and full ROM as able, especially pushing to straight and hold at flexion max ?    - Squat set 2 x 10 ?   - Glut set 2 x 10  ?   - ankle pump 2 x 10  ?   - Straight leg raise 2 x 8  cueing for core activation prior to lift and full extension as able - fatigue post  ?   - hamstring and IT band stretch with assist from DPT for elevation 2 x 45 seconds  ?    Seated =  ?    - Heel/toe raises 2 x 10  ?- Heel slides 2 x 10 with cue for TKE on full extension as able with toes up ?    - LAQ 2 x 10 with cue for full extension and slow eccentric control ?    Standing =  At countertop in exam room ?    - R leg hip abduction 2 x 10 ?    - R leg hip extension 2 x 10  ?    - lumbar standing flexion forward bend stretch  ? ?  ?  ?  ?  ?  ?  ?  [facility] Adult PT Treatment/Exercise - 07/14/21 0001   ?  ?    ?     ?  Knee/Hip Exercises: Supine  ?  Quad Sets 10 reps;Right   ?  Heel Slides AROM;5 reps   ?  Other Supine Knee/Hip Exercises glute set 5 x 5",  ankle pumps x 5   ?  ?   ?  ?  ?   ?  ?  ?  ?  ?  ?  ?  ?  ?  ?  PT Education - 07/23/21 1534   ?  ?  Education Details Educated use of butler to assist with donning compression garment  ?  Person(s) Educated

## 2021-07-30 ENCOUNTER — Ambulatory Visit (HOSPITAL_COMMUNITY): Payer: Medicare Other

## 2021-07-30 ENCOUNTER — Encounter (HOSPITAL_COMMUNITY): Payer: Self-pay

## 2021-07-30 ENCOUNTER — Encounter (HOSPITAL_COMMUNITY): Payer: Medicare Other

## 2021-07-30 ENCOUNTER — Other Ambulatory Visit: Payer: Self-pay

## 2021-07-30 ENCOUNTER — Encounter: Payer: Self-pay | Admitting: Internal Medicine

## 2021-07-30 DIAGNOSIS — R2689 Other abnormalities of gait and mobility: Secondary | ICD-10-CM

## 2021-07-30 DIAGNOSIS — M25561 Pain in right knee: Secondary | ICD-10-CM

## 2021-07-30 NOTE — Therapy (Signed)
?OUTPATIENT PHYSICAL THERAPY TREATMENT NOTE ? ? ?Patient Name: [name] ?MRN: [id number] ?DOB:[birth year], 61 y.o., female ?Today's Date: 07/30/2021 ? ?PCP: Sharilyn Sites, MD ?REFERRING PROVIDER: Netta Cedars MD  ? ? PT End of Session - 07/30/21 1409   ? ? Visit Number 4   ? Number of Visits 12   ? Date for PT Re-Evaluation 08/25/21   ? Authorization Type UHC medicare no auth - med nec./ [employer] 2ndary   ? Progress Note Due on Visit 10   ? PT Start Time 3149   ? PT Stop Time 7026   ? PT Time Calculation (min) 41 min   ? Activity Tolerance Patient tolerated treatment well   ? Behavior During Therapy [facility] for tasks assessed/performed   ? ?  ?  ? ?  ? ? ?Past Medical History:  ?Diagnosis Date  ? Adenomatous polyp 2009  ? Anxiety   ? Arthritis   ? Bipolar 1 disorder ([place])   ? Constipation   ? Depression   ? Diabetes mellitus   ? Diabetes mellitus, type II ([place])   ? Diverticula of colon 2009  ? Generalized headaches   ? GERD (gastroesophageal reflux disease)   ? History of hiatal hernia   ? small  ? Hypertension   ? no meds  ? Migraine   ? Neuropathy   ? both feet  ? Obesity   ? Pelvic floor dysfunction   ? abnormal anorectal manometry at [facility] in 2009  ? Plantar fasciitis both feet  ? Sleep apnea   ? cpap setting of 3.5  ? ?Past Surgical History:  ?Procedure Laterality Date  ? ABDOMINAL HYSTERECTOMY    ? compete  ? CATARACT EXTRACTION W/PHACO Right 02/15/2017  ? Procedure: CATARACT EXTRACTION PHACO AND INTRAOCULAR LENS PLACEMENT (IOC);  Surgeon: Rutherford Guys, MD;  [facility]: [facility];  Service: Ophthalmology;  Laterality: Right;  CDE: 4.18  ? CATARACT EXTRACTION W/PHACO Left 03/01/2017  ? Procedure: CATARACT EXTRACTION PHACO AND INTRAOCULAR LENS PLACEMENT (IOC);  Surgeon: Rutherford Guys, MD;  Location: [facility];  Service: Ophthalmology;  Laterality: Left;  CDE: 2.56  ? COLON RESECTION    03/30/2002  ? with end-colostomy and Hartmann's pouch  ? COLON SURGERY    ? complicated diverticulitis requiring sigmoid resection with  colostomy and subsequent takedown  ? COLONOSCOPY  12/11/2007  ? Dr. Gala Romney- marginal prep, normal rectum pancolonic diverticula, adenomatous polyp  ? COLONOSCOPY  08/28/2003    ?  Wide open colonic anastomosis/Scattered diverticula noted throughout colon/ Small external hemorrhoids  ? COLONOSCOPY  04/2012  ? [facility]: hyperplastic polyps, diverticulosis, ileocolonic anastomosis.  ? COLONOSCOPY WITH PROPOFOL N/A 06/07/2016  ? Procedure: COLONOSCOPY WITH PROPOFOL;  Surgeon: Daneil Dolin, MD;  [facility];  Service: Endoscopy;  Laterality: N/A;  7:30 am  ? COLOSTOMY CLOSURE  07/10/2002  ? ESOPHAGOGASTRODUODENOSCOPY N/A 01/08/2020  ? Procedure: UPPER GASTROINTESTINAL ENDOSCOPY;  Surgeon: Alphonsa Overall, MD;  [facility];  Service: General;  Laterality: N/A;  ? ESOPHAGOGASTRODUODENOSCOPY (EGD) WITH PROPOFOL N/A 08/23/2016  ? Procedure: ESOPHAGOGASTRODUODENOSCOPY (EGD) WITH PROPOFOL;  Surgeon: Alphonsa Overall, MD;  [facility];  Service: General;  Laterality: N/A;  ? FLEXIBLE SIGMOIDOSCOPY  01/20/2012  ? RMR: incomplete/attempted colonoscopy. Inadequate prep precluded examination  ? HEEL SPUR SURGERY Left 09/11/2013  ? both have been done  ? KNEE ARTHROSCOPY  02/04/2004  ?  left knee/partial medial meniscectomy.  ? KNEE SURGERY    ? 3 arthroscopic  2 on left 1 on right  ?  LAPAROSCOPIC GASTRIC BANDING  2009  ? LAPAROSCOPIC GASTRIC SLEEVE RESECTION N/A 01/08/2020  ? Procedure: LAPAROSCOPIC SLEEVE GASTRECTOMY;  Surgeon: Alphonsa Overall, MD;  [facility];  Service: General;  Laterality: N/A;  ? LAPAROSCOPIC SALPINGOOPHERECTOMY  03/30/2002  ? TOTAL KNEE ARTHROPLASTY Left 03/27/2021  ? Procedure: TOTAL KNEE ARTHROPLASTY;  Surgeon: Netta Cedars, MD;  [facility];  Service: Orthopedics;  Laterality: Left;  ? TOTAL KNEE ARTHROPLASTY Right 07/10/2021  ? Procedure: TOTAL KNEE ARTHROPLASTY;  Surgeon: Netta Cedars, MD;  [facility];  Service: Orthopedics;  Laterality: Right;  ? TUBAL LIGATION    ? ?Patient  Active Problem List  ? Diagnosis Date Noted  ? Status post total knee replacement, right 07/10/2021  ? Vitamin D deficiency 07/02/2021  ? H/O total knee replacement, left 03/27/2021  ? Morbid obesity with BMI of 45.0-49.9, adult ([place]) 01/08/2020  ? Uncontrolled type 2 diabetes mellitus with hyperglycemia ([place]) 05/21/2019  ? Essential hypertension, benign 05/21/2019  ? Mixed hyperlipidemia 05/21/2019  ? Plantar fasciitis of right foot 06/14/2018  ? Osteoarthritis of left knee 04/05/2018  ? Osteoarthritis of right knee 04/05/2018  ? Hx of adenomatous colonic polyps 06/04/2016  ? LLQ pain 12/31/2014  ? Abdominal pain, RLQ (right lower quadrant) 04/24/2014  ? OSA on CPAP 10/18/2013  ? Alcohol abuse 01/01/2013  ? Migraine without aura, with intractable migraine, so stated, without mention of status migrainosus 09/21/2012  ? Memory loss 09/21/2012  ? Lapband APL Nov 2009 06/21/2012  ? Outbursts of anger 03/31/2012  ? FH: colon cancer 11/03/2011  ? Rectal bleed 11/03/2011  ? Esophageal dysphagia 11/03/2011  ? COLONIC POLYPS 10/10/2008  ? DIABETES MELLITUS, TYPE II 09/02/2008  ? Obesity 09/02/2008  ? ANXIETY NEUROSIS 09/02/2008  ? Severe mood disorder without psychotic features ([place]) 09/02/2008  ? GERD 09/02/2008  ? Constipation 09/02/2008  ? OSTEOARTHRITIS 09/02/2008  ? ? ?REFERRING DIAG: M17.11 unilateral primary osteoarthritis, rt knee per Esmond Plants, MD  ? ?THERAPY DIAG:  ?Right knee pain, unspecified chronicity ? ?Other abnormalities of gait and mobility ? ?PERTINENT HISTORY: Right TKA 07/10/21 ? ?PRECAUTIONS: none ? ?SUBJECTIVE: Pt stated she is proud of her knee mobility, feels its improving.  Pain scale 6/10 ? ?PAIN:  ?Are you having pain? Yes: NPRS scale: 6/10 ?Pain location: right knee ?Pain description: intermittent achey pain ?Relieving factors: ice ? ? ?  ?  ?  ? Treatment today  ?07/30/21: ? Bike seat 10 full revolution ?Standing: ? Knee drive on 2nd step 5x 10" ? Hamstring stretch standing 2nd step 5x  30" ? TKE 10x 5" GTB ? Heel raises ? Hamstring curls 10x ?5 STS no HHA ?Supine: heel slide 10x ? AROM 4-107 degrees ? ?Manual: Decongestive technqiues for edema control ? ?07/28/21: ?  ? ? 07/28/21 0001  ?Knee/Hip Exercises: Stretches  ?Knee: Self-Stretch to increase Flexion 5 reps;10 seconds  ?Knee: Self-Stretch Limitations knee drive on 62HU step  ?Knee/Hip Exercises: Aerobic  ?Stationary Bike Initial rocking then full revolution 5' seat 10  ?Knee/Hip Exercises: Standing  ?Gait Training 227f cueing to trunk rotation with UE swing  ?Knee/Hip Exercises: Supine  ?Quad Sets 10 reps;Right  ?Heel Slides AROM;10 reps  ?Straight Leg Raises Right;10 reps  ?Straight Leg Raises Limitations quad set prior raise  ?Knee Extension AROM  ?Knee Extension Limitations 6 lacking  ?Knee Flexion AROM  ?Knee Flexion Limitations 104  ?Short Arc QTarget CorporationRight;10 reps  ?Manual Therapy  ?Manual Therapy Edema management  ?Manual therapy comments Manual complete separate than rest  of tx  ?Edema Management Decongestive techniques with LE elevated for edema control  ? ? ?     ? ?07/23/21 ?  Therapeutic exercises  ?   - HEP review give at evaluation  ?    Supine = ?    - Heel slides 2 x 10 with cue for slow and full ROM as able, especially pushing to straight and hold at flexion max ?    - Squat set 2 x 10 ?   - Glut set 2 x 10  ?   - ankle pump 2 x 10  ?   - Straight leg raise 2 x 8  cueing for core activation prior to lift and full extension as able - fatigue post  ?   - hamstring and IT band stretch with assist from DPT for elevation 2 x 45 seconds  ?    Seated =  ?    - Heel/toe raises 2 x 10  ?- Heel slides 2 x 10 with cue for TKE on full extension as able with toes up ?    - LAQ 2 x 10 with cue for full extension and slow eccentric control ?    Standing =  At countertop in exam room ?    - R leg hip abduction 2 x 10 ?    - R leg hip extension 2 x 10  ?    - lumbar standing flexion forward bend stretch  ? ?  ?  ?  ?  ?  ?  ?  [facility] Adult PT  Treatment/Exercise - 07/14/21 0001   ?  ?    ?     ?  Knee/Hip Exercises: Supine  ?  Quad Sets 10 reps;Right   ?  Heel Slides AROM;5 reps   ?  Other Supine Knee/Hip Exercises glute set 5 x 5", ankle pumps x 5

## 2021-08-04 ENCOUNTER — Encounter (HOSPITAL_COMMUNITY): Payer: Self-pay | Admitting: Physical Therapy

## 2021-08-04 ENCOUNTER — Other Ambulatory Visit: Payer: Self-pay

## 2021-08-04 ENCOUNTER — Ambulatory Visit (HOSPITAL_COMMUNITY): Payer: Medicare Other | Admitting: Physical Therapy

## 2021-08-04 DIAGNOSIS — R2689 Other abnormalities of gait and mobility: Secondary | ICD-10-CM | POA: Diagnosis not present

## 2021-08-04 DIAGNOSIS — M25561 Pain in right knee: Secondary | ICD-10-CM

## 2021-08-04 NOTE — Therapy (Signed)
?OUTPATIENT PHYSICAL THERAPY TREATMENT NOTE ? ? ?Patient Name: [name] ?MRN: [id number] ?DOB:02/12/1961, 61 y.o., female ?Today's Date: 08/04/2021 ? ?PCP: Sharilyn Sites, MD ?REFERRING PROVIDER: Netta Cedars MD  ? ? PT End of Session - 08/04/21 1653   ? ? Visit Number 5   ? Number of Visits 12   ? Date for PT Re-Evaluation 08/25/21   ? Authorization Type UHC medicare no auth - med nec./ T[employer] 2ndary   ? Progress Note Due on Visit 10   ? PT Start Time 1648   ? PT Stop Time 1728   ? PT Time Calculation (min) 40 min   ? Activity Tolerance Patient tolerated treatment well   ? Behavior During Therapy [facility] for tasks assessed/performed   ? ?  ?  ? ?  ? ? ?Past Medical History:  ?Diagnosis Date  ? Adenomatous polyp 2009  ? Anxiety   ? Arthritis   ? Bipolar 1 disorder ([place])   ? Constipation   ? Depression   ? Diabetes mellitus   ? Diabetes mellitus, type II ([place])   ? Diverticula of colon 2009  ? Generalized headaches   ? GERD (gastroesophageal reflux disease)   ? History of hiatal hernia   ? small  ? Hypertension   ? no meds  ? Migraine   ? Neuropathy   ? both feet  ? Obesity   ? Pelvic floor dysfunction   ? abnormal anorectal manometry at [facility] in 2009  ? Plantar fasciitis both feet  ? Sleep apnea   ? cpap setting of 3.5  ? ?Past Surgical History:  ?Procedure Laterality Date  ? ABDOMINAL HYSTERECTOMY    ? compete  ? CATARACT EXTRACTION W/PHACO Right 02/15/2017  ? Procedure: CATARACT EXTRACTION PHACO AND INTRAOCULAR LENS PLACEMENT (IOC);  Surgeon: Rutherford Guys, MD;  [facility];  Service: Ophthalmology;  Laterality: Right;  CDE: 4.18  ? CATARACT EXTRACTION W/PHACO Left 03/01/2017  ? Procedure: CATARACT EXTRACTION PHACO AND INTRAOCULAR LENS PLACEMENT (IOC);  Surgeon: Rutherford Guys, MD;  [facility];  Service: Ophthalmology;  Laterality: Left;  CDE: 2.56  ? COLON RESECTION    03/30/2002  ? with end-colostomy and Hartmann's pouch  ? COLON SURGERY    ? complicated diverticulitis requiring sigmoid resection with  colostomy and subsequent takedown  ? COLONOSCOPY  12/11/2007  ? Dr. Gala Romney- marginal prep, normal rectum pancolonic diverticula, adenomatous polyp  ? COLONOSCOPY  08/28/2003    ?  Wide open colonic anastomosis/Scattered diverticula noted throughout colon/ Small external hemorrhoids  ? COLONOSCOPY  04/2012  ? [facility]: hyperplastic polyps, diverticulosis, ileocolonic anastomosis.  ? COLONOSCOPY WITH PROPOFOL N/A 06/07/2016  ? Procedure: COLONOSCOPY WITH PROPOFOL;  Surgeon: Daneil Dolin, MD;  [facility];  Service: Endoscopy;  Laterality: N/A;  7:30 am  ? COLOSTOMY CLOSURE  07/10/2002  ? ESOPHAGOGASTRODUODENOSCOPY N/A 01/08/2020  ? Procedure: UPPER GASTROINTESTINAL ENDOSCOPY;  Surgeon: Alphonsa Overall, MD;  [facility];  Service: General;  Laterality: N/A;  ? ESOPHAGOGASTRODUODENOSCOPY (EGD) WITH PROPOFOL N/A 08/23/2016  ? Procedure: ESOPHAGOGASTRODUODENOSCOPY (EGD) WITH PROPOFOL;  Surgeon: Alphonsa Overall, MD;  [facility];  Service: General;  Laterality: N/A;  ? FLEXIBLE SIGMOIDOSCOPY  01/20/2012  ? RMR: incomplete/attempted colonoscopy. Inadequate prep precluded examination  ? HEEL SPUR SURGERY Left 09/11/2013  ? both have been done  ? KNEE ARTHROSCOPY  02/04/2004  ?  left knee/partial medial meniscectomy.  ? KNEE SURGERY    ? 3 arthroscopic  2 on left 1 on right  ?  LAPAROSCOPIC GASTRIC BANDING  2009  ? LAPAROSCOPIC GASTRIC SLEEVE RESECTION N/A 01/08/2020  ? Procedure: LAPAROSCOPIC SLEEVE GASTRECTOMY;  Surgeon: Alphonsa Overall, MD;  [facility];  Service: General;  Laterality: N/A;  ? LAPAROSCOPIC SALPINGOOPHERECTOMY  03/30/2002  ? TOTAL KNEE ARTHROPLASTY Left 03/27/2021  ? Procedure: TOTAL KNEE ARTHROPLASTY;  Surgeon: Netta Cedars, MD;  [facility];  Service: Orthopedics;  Laterality: Left;  ? TOTAL KNEE ARTHROPLASTY Right 07/10/2021  ? Procedure: TOTAL KNEE ARTHROPLASTY;  Surgeon: Netta Cedars, MD;  [facility];  Service: Orthopedics;  Laterality: Right;  ? TUBAL LIGATION    ? ?Patient  Active Problem List  ? Diagnosis Date Noted  ? Status post total knee replacement, right 07/10/2021  ? Vitamin D deficiency 07/02/2021  ? H/O total knee replacement, left 03/27/2021  ? Morbid obesity with BMI of 45.0-49.9, adult ([place]) 01/08/2020  ? Uncontrolled type 2 diabetes mellitus with hyperglycemia ([place]) 05/21/2019  ? Essential hypertension, benign 05/21/2019  ? Mixed hyperlipidemia 05/21/2019  ? Plantar fasciitis of right foot 06/14/2018  ? Osteoarthritis of left knee 04/05/2018  ? Osteoarthritis of right knee 04/05/2018  ? Hx of adenomatous colonic polyps 06/04/2016  ? LLQ pain 12/31/2014  ? Abdominal pain, RLQ (right lower quadrant) 04/24/2014  ? OSA on CPAP 10/18/2013  ? Alcohol abuse 01/01/2013  ? Migraine without aura, with intractable migraine, so stated, without mention of status migrainosus 09/21/2012  ? Memory loss 09/21/2012  ? Lapband APL Nov 2009 06/21/2012  ? Outbursts of anger 03/31/2012  ? FH: colon cancer 11/03/2011  ? Rectal bleed 11/03/2011  ? Esophageal dysphagia 11/03/2011  ? COLONIC POLYPS 10/10/2008  ? DIABETES MELLITUS, TYPE II 09/02/2008  ? Obesity 09/02/2008  ? ANXIETY NEUROSIS 09/02/2008  ? Severe mood disorder without psychotic features ([place]) 09/02/2008  ? GERD 09/02/2008  ? Constipation 09/02/2008  ? OSTEOARTHRITIS 09/02/2008  ? ? ?REFERRING DIAG: M17.11 unilateral primary osteoarthritis, rt knee per Esmond Plants, MD  ? ?THERAPY DIAG:  ?Right knee pain, unspecified chronicity ? ?Other abnormalities of gait and mobility ? ?PERTINENT HISTORY: Right TKA 07/10/21 ? ?PRECAUTIONS: none ? ?SUBJECTIVE: Doing well today. Knee is feeling better  ? ?PAIN:  ?Are you having pain? Yes: NPRS scale: 4/10 ?Pain location: Rt knee ?Pain description: sore, aching  ?Aggravating factors: stairs ?Relieving factors: rest  ? ?  ? Treatment today  ?08/04/21 ?Bike 4 min for ROM  ?Heel raises x20 ?Calf stretch 3 x 30" on steps ?Step ups 6 inch 2 x 10 HHA x 2  ?Step downs 4 inch 2 x 10 HHA x 2  ?TKE 3 plates 2  V89  ?Gait training 1 RT in clinic  ?Standing hip abduction x20 each  ?Knee driver on 2nd step 5 x 10"  ? ?Manual PROM knee flexion (patient in supine) AROM: 0-109 degrees ? ?07/30/21: ? Bike seat 10 full revolution ?Standing: ? Knee drive on 2nd step 5x 10" ? Hamstring stretch standing 2nd step 5x 30" ? TKE 10x 5" GTB ? Heel raises ? Hamstring curls 10x ?5 STS no HHA ?Supine: heel slide 10x ? AROM 4-107 degrees ? ?Manual: Decongestive technqiues for edema control ? ?  PT Education   ?  ?  Education Details On exercise form and function   ?  Person(s) Educated Patient   ?  Methods Explanation;Handout   ?  Comprehension Verbalized understanding   ?  ?   ?  ?  ?   ? ?  ?  PT Short Term Goals -  07/14/21 1611   ?  ?    ?     ?  PT SHORT TERM GOAL #1  ?  Title Patient will be independent with initial HEP and self-management strategies to improve functional outcomes   ?  Time 3   ?  Period Weeks   ?  Status Ongoing  ?  Target Date 07/28/21   ?  ?   ?  ?  ?   ?  ?  ?  ?  PT Long Term Goals - 07/14/21 1611   ?  ?    ?     ?  PT LONG TERM GOAL #1  ?  Title Patient will have RT knee AROM 0-120 degrees to improve functional mobility and facilitate squatting to pick up items from floor.   ?  Time 6   ?  Period Weeks   ?  Status Ongoing  ?  Target Date 08/25/21   ?     ?  PT LONG TERM GOAL #2  ?  Title Patient will report at least 75% overall improvement in subjective complaint to indicate improvement in ability to perform ADLs.   ?  Time 6   ?  Period Weeks   ?  Status Ongoing  ?  Target Date 08/25/21   ?     ?  PT LONG TERM GOAL #3  ?  Title Patient will improve FOTO score to predicted value to indicate improvement in functional outcomes   ?  Time 6   ?  Period Weeks   ?  Status Ongoing  ?  Target Date 08/25/21   ?     ?  PT LONG TERM GOAL #4  ?  Title Patient will be independent with advanced HEP and self-management strategies to improve functional outcomes   ?  Time 6   ?  Period Weeks   ?  Status Ongoing  ?  Target Date  08/25/21   ?     ?  PT LONG TERM GOAL #5  ?  Title Patient will have RT knee MMT equal to or > 4+/5 MMT for improved ability to perform functional mobility, stair ambulation and ADLs.   ?  Time 6   ?  Period

## 2021-08-06 ENCOUNTER — Ambulatory Visit (HOSPITAL_COMMUNITY): Payer: Medicare Other | Admitting: Physical Therapy

## 2021-08-06 ENCOUNTER — Encounter (HOSPITAL_COMMUNITY): Payer: Self-pay | Admitting: Physical Therapy

## 2021-08-06 DIAGNOSIS — R2689 Other abnormalities of gait and mobility: Secondary | ICD-10-CM | POA: Diagnosis not present

## 2021-08-06 DIAGNOSIS — M25561 Pain in right knee: Secondary | ICD-10-CM | POA: Diagnosis not present

## 2021-08-06 NOTE — Therapy (Signed)
?OUTPATIENT PHYSICAL THERAPY TREATMENT NOTE ? ? ?Patient Name: [name] ?MRN: [id number] ?DOB:[birth year], 61 y.o., female ?Today's Date: 08/06/2021 ? ?PCP: Sharilyn Sites, MD ?REFERRING PROVIDER: Netta Cedars MD  ? ? PT End of Session - 08/06/21 1635   ? ? Visit Number 6   ? Number of Visits 12   ? Date for PT Re-Evaluation 08/25/21   ? Authorization Type [employer] medicare no auth - med nec./ [employer] 2ndary   ? Progress Note Due on Visit 10   ? PT Start Time 6222   ? PT Stop Time 9798   ? PT Time Calculation (min) 45 min   ? Activity Tolerance Patient tolerated treatment well   ? Behavior During Therapy [facility] for tasks assessed/performed   ? ?  ?  ? ?  ? ? ?Past Medical History:  ?Diagnosis Date  ? Adenomatous polyp 2009  ? Anxiety   ? Arthritis   ? Bipolar 1 disorder ([place])   ? Constipation   ? Depression   ? Diabetes mellitus   ? Diabetes mellitus, type II ([place])   ? Diverticula of colon 2009  ? Generalized headaches   ? GERD (gastroesophageal reflux disease)   ? History of hiatal hernia   ? small  ? Hypertension   ? no meds  ? Migraine   ? Neuropathy   ? both feet  ? Obesity   ? Pelvic floor dysfunction   ? abnormal anorectal manometry at [facility] in 2009  ? Plantar fasciitis both feet  ? Sleep apnea   ? cpap setting of 3.5  ? ?Past Surgical History:  ?Procedure Laterality Date  ? ABDOMINAL HYSTERECTOMY    ? compete  ? CATARACT EXTRACTION W/PHACO Right 02/15/2017  ? Procedure: CATARACT EXTRACTION PHACO AND INTRAOCULAR LENS PLACEMENT (IOC);  Surgeon: Rutherford Guys, MD;  [facility]: [facility];  Service: Ophthalmology;  Laterality: Right;  CDE: 4.18  ? CATARACT EXTRACTION W/PHACO Left 03/01/2017  ? Procedure: CATARACT EXTRACTION PHACO AND INTRAOCULAR LENS PLACEMENT (IOC);  Surgeon: Rutherford Guys, MD;  Location: [facility];  Service: Ophthalmology;  Laterality: Left;  CDE: 2.56  ? COLON RESECTION    03/30/2002  ? with end-colostomy and Hartmann's pouch  ? COLON SURGERY    ? complicated diverticulitis requiring sigmoid resection with  colostomy and subsequent takedown  ? COLONOSCOPY  12/11/2007  ? Dr. Gala Romney- marginal prep, normal rectum pancolonic diverticula, adenomatous polyp  ? COLONOSCOPY  08/28/2003    ?  Wide open colonic anastomosis/Scattered diverticula noted throughout colon/ Small external hemorrhoids  ? COLONOSCOPY  04/2012  ? [facility]: hyperplastic polyps, diverticulosis, ileocolonic anastomosis.  ? COLONOSCOPY WITH PROPOFOL N/A 06/07/2016  ? Procedure: COLONOSCOPY WITH PROPOFOL;  Surgeon: Daneil Dolin, MD;  [facility];  Service: Endoscopy;  Laterality: N/A;  7:30 am  ? COLOSTOMY CLOSURE  07/10/2002  ? ESOPHAGOGASTRODUODENOSCOPY N/A 01/08/2020  ? Procedure: UPPER GASTROINTESTINAL ENDOSCOPY;  Surgeon: Alphonsa Overall, MD;  [facility];  Service: General;  Laterality: N/A;  ? ESOPHAGOGASTRODUODENOSCOPY (EGD) WITH PROPOFOL N/A 08/23/2016  ? Procedure: ESOPHAGOGASTRODUODENOSCOPY (EGD) WITH PROPOFOL;  Surgeon: Alphonsa Overall, MD;  [facility];  Service: General;  Laterality: N/A;  ? FLEXIBLE SIGMOIDOSCOPY  01/20/2012  ? RMR: incomplete/attempted colonoscopy. Inadequate prep precluded examination  ? HEEL SPUR SURGERY Left 09/11/2013  ? both have been done  ? KNEE ARTHROSCOPY  02/04/2004  ?  left knee/partial medial meniscectomy.  ? KNEE SURGERY    ? 3 arthroscopic  2 on left 1 on right  ?  LAPAROSCOPIC GASTRIC BANDING  2009  ? LAPAROSCOPIC GASTRIC SLEEVE RESECTION N/A 01/08/2020  ? Procedure: LAPAROSCOPIC SLEEVE GASTRECTOMY;  Surgeon: Alphonsa Overall, MD;  [facility];  Service: General;  Laterality: N/A;  ? LAPAROSCOPIC SALPINGOOPHERECTOMY  03/30/2002  ? TOTAL KNEE ARTHROPLASTY Left 03/27/2021  ? Procedure: TOTAL KNEE ARTHROPLASTY;  Surgeon: Netta Cedars, MD;  [facility];  Service: Orthopedics;  Laterality: Left;  ? TOTAL KNEE ARTHROPLASTY Right 07/10/2021  ? Procedure: TOTAL KNEE ARTHROPLASTY;  Surgeon: Netta Cedars, MD;  [facility];  Service: Orthopedics;  Laterality: Right;  ? TUBAL LIGATION    ? ?Patient  Active Problem List  ? Diagnosis Date Noted  ? Status post total knee replacement, right 07/10/2021  ? Vitamin D deficiency 07/02/2021  ? H/O total knee replacement, left 03/27/2021  ? Morbid obesity with BMI of 45.0-49.9, adult ([place]) 01/08/2020  ? Uncontrolled type 2 diabetes mellitus with hyperglycemia ([place]) 05/21/2019  ? Essential hypertension, benign 05/21/2019  ? Mixed hyperlipidemia 05/21/2019  ? Plantar fasciitis of right foot 06/14/2018  ? Osteoarthritis of left knee 04/05/2018  ? Osteoarthritis of right knee 04/05/2018  ? Hx of adenomatous colonic polyps 06/04/2016  ? LLQ pain 12/31/2014  ? Abdominal pain, RLQ (right lower quadrant) 04/24/2014  ? OSA on CPAP 10/18/2013  ? Alcohol abuse 01/01/2013  ? Migraine without aura, with intractable migraine, so stated, without mention of status migrainosus 09/21/2012  ? Memory loss 09/21/2012  ? Lapband APL Nov 2009 06/21/2012  ? Outbursts of anger 03/31/2012  ? FH: colon cancer 11/03/2011  ? Rectal bleed 11/03/2011  ? Esophageal dysphagia 11/03/2011  ? COLONIC POLYPS 10/10/2008  ? DIABETES MELLITUS, TYPE II 09/02/2008  ? Obesity 09/02/2008  ? ANXIETY NEUROSIS 09/02/2008  ? Severe mood disorder without psychotic features ([place]) 09/02/2008  ? GERD 09/02/2008  ? Constipation 09/02/2008  ? OSTEOARTHRITIS 09/02/2008  ? ? ?REFERRING DIAG: M17.11 unilateral primary osteoarthritis, rt knee per Esmond Plants, MD  ? ?THERAPY DIAG:  ?Right knee pain, unspecified chronicity ? ?Other abnormalities of gait and mobility ? ?PERTINENT HISTORY: Right TKA 07/10/21 ? ?PRECAUTIONS: none ? ?SUBJECTIVE: Patient presents today using straight cane. Says her knee is sore today. Not sure why.  ? ?PAIN:  ?Are you having pain? Yes: NPRS scale: 5/10 ?Pain location: Rt knee ?Pain description: sore, aching  ?Aggravating factors: stairs ?Relieving factors: rest  ? ?  ? Treatment today  ?08/06/21 ?Bike 4 min lv 3 for ROM  ?Heel raises x20 ?Calf stretch 3 x 30" on steps ?TKE 3 plates 2 K74  ?Gait  training 2 RT in clinic no AD  ?Standing hip abduction x20 each  ?Knee driver on 2nd step 5 x 10"  ? ?Manual PROM knee flexion (patient in supine) AROM: 0-106 degrees ? ?08/04/21 ?Bike 4 min for ROM  ?Heel raises x20 ?Calf stretch 3 x 30" on steps ?Step ups 6 inch 2 x 10 HHA x 2  ?Step downs 4 inch 2 x 10 HHA x 2  ?TKE 3 plates 2 Q59  ?Gait training 1 RT in clinic  ?Standing hip abduction x20 each  ?Knee driver on 2nd step 5 x 10"  ? ?Manual PROM knee flexion (patient in supine) AROM: 0-109 degrees ? ?07/30/21: ? Bike seat 10 full revolution ?Standing: ? Knee drive on 2nd step 5x 10" ? Hamstring stretch standing 2nd step 5x 30" ? TKE 10x 5" GTB ? Heel raises ? Hamstring curls 10x ?5 STS no HHA ?Supine: heel slide 10x ? AROM 4-107 degrees ? ?  Manual: Decongestive technqiues for edema control ? ?  PT Education   ?  ?  Education Details On exercise form and function   ?  Person(s) Educated Patient   ?  Methods Explanation;Handout   ?  Comprehension Verbalized understanding   ?  ?   ?  ?  ?   ? ?  ?  PT Short Term Goals - 07/14/21 1611   ?  ?    ?     ?  PT SHORT TERM GOAL #1  ?  Title Patient will be independent with initial HEP and self-management strategies to improve functional outcomes   ?  Time 3   ?  Period Weeks   ?  Status Ongoing  ?  Target Date 07/28/21   ?  ?   ?  ?  ?   ?  ?  ?  ?  PT Long Term Goals - 07/14/21 1611   ?  ?    ?     ?  PT LONG TERM GOAL #1  ?  Title Patient will have RT knee AROM 0-120 degrees to improve functional mobility and facilitate squatting to pick up items from floor.   ?  Time 6   ?  Period Weeks   ?  Status Ongoing  ?  Target Date 08/25/21   ?     ?  PT LONG TERM GOAL #2  ?  Title Patient will report at least 75% overall improvement in subjective complaint to indicate improvement in ability to perform ADLs.   ?  Time 6   ?  Period Weeks   ?  Status Ongoing  ?  Target Date 08/25/21   ?     ?  PT LONG TERM GOAL #3  ?  Title Patient will improve FOTO score to predicted value to indicate  improvement in functional outcomes   ?  Time 6   ?  Period Weeks   ?  Status Ongoing  ?  Target Date 08/25/21   ?     ?  PT LONG TERM GOAL #4  ?  Title Patient will be independent with advanced HEP and self-ma

## 2021-08-10 DIAGNOSIS — E1165 Type 2 diabetes mellitus with hyperglycemia: Secondary | ICD-10-CM | POA: Diagnosis not present

## 2021-08-11 ENCOUNTER — Ambulatory Visit (HOSPITAL_COMMUNITY): Payer: Medicare Other | Attending: Family Medicine

## 2021-08-11 ENCOUNTER — Encounter (HOSPITAL_COMMUNITY): Payer: Self-pay

## 2021-08-11 DIAGNOSIS — R2689 Other abnormalities of gait and mobility: Secondary | ICD-10-CM | POA: Insufficient documentation

## 2021-08-11 DIAGNOSIS — M25561 Pain in right knee: Secondary | ICD-10-CM | POA: Insufficient documentation

## 2021-08-11 NOTE — Therapy (Signed)
?OUTPATIENT PHYSICAL THERAPY TREATMENT NOTE ? ? ?Patient Name: [name] ?MRN: [id number] ?DOB:[birth year], 61 y.o., female ?Today's Date: 08/11/2021 ? ?PCP: Sharilyn Sites, MD ?REFERRING PROVIDER: Netta Cedars MD  ? ? PT End of Session - 08/11/21 1703   ? ? Visit Number 7   ? Number of Visits 12   ? Date for PT Re-Evaluation 08/25/21   ? Authorization Type [employer] medicare no auth - med nec./ [employer] 2ndary   ? Progress Note Due on Visit 10   ? PT Start Time 1612   ? PT Stop Time 1703   ? PT Time Calculation (min) 51 min   ? Activity Tolerance Patient tolerated treatment well   ? Behavior During Therapy [facility] for tasks assessed/performed   ? ?  ?  ? ?  ? ? ? ?Past Medical History:  ?Diagnosis Date  ? Adenomatous polyp 2009  ? Anxiety   ? Arthritis   ? Bipolar 1 disorder ([place])   ? Constipation   ? Depression   ? Diabetes mellitus   ? Diabetes mellitus, type II ([place])   ? Diverticula of colon 2009  ? Generalized headaches   ? GERD (gastroesophageal reflux disease)   ? History of hiatal hernia   ? small  ? Hypertension   ? no meds  ? Migraine   ? Neuropathy   ? both feet  ? Obesity   ? Pelvic floor dysfunction   ? abnormal anorectal [facility] at [facility] in 2009  ? Plantar fasciitis both feet  ? Sleep apnea   ? cpap setting of 3.5  ? ?Past Surgical History:  ?Procedure Laterality Date  ? ABDOMINAL HYSTERECTOMY    ? compete  ? CATARACT EXTRACTION W/PHACO Right 02/15/2017  ? Procedure: CATARACT EXTRACTION PHACO AND INTRAOCULAR LENS PLACEMENT (IOC);  Surgeon: Rutherford Guys, MD;  [facility];  Service: Ophthalmology;  Laterality: Right;  CDE: 4.18  ? CATARACT EXTRACTION W/PHACO Left 03/01/2017  ? Procedure: CATARACT EXTRACTION PHACO AND INTRAOCULAR LENS PLACEMENT (IOC);  Surgeon: Rutherford Guys, MD;  [facility]: [facility];  Service: Ophthalmology;  Laterality: Left;  CDE: 2.56  ? COLON RESECTION    03/30/2002  ? with end-colostomy and Hartmann's pouch  ? COLON SURGERY    ? complicated diverticulitis requiring sigmoid resection with  colostomy and subsequent takedown  ? COLONOSCOPY  12/11/2007  ? Dr. Gala Romney- marginal prep, normal rectum pancolonic diverticula, adenomatous polyp  ? COLONOSCOPY  08/28/2003    ?  Wide open colonic anastomosis/Scattered diverticula noted throughout colon/ Small external hemorrhoids  ? COLONOSCOPY  04/2012  ? [facility]: hyperplastic polyps, diverticulosis, ileocolonic anastomosis.  ? COLONOSCOPY WITH PROPOFOL N/A 06/07/2016  ? Procedure: COLONOSCOPY WITH PROPOFOL;  Surgeon: Daneil Dolin, MD;  [facility];  Service: Endoscopy;  Laterality: N/A;  7:30 am  ? COLOSTOMY CLOSURE  07/10/2002  ? ESOPHAGOGASTRODUODENOSCOPY N/A 01/08/2020  ? Procedure: UPPER GASTROINTESTINAL ENDOSCOPY;  Surgeon: Alphonsa Overall, MD;  [facility];  Service: General;  Laterality: N/A;  ? ESOPHAGOGASTRODUODENOSCOPY (EGD) WITH PROPOFOL N/A 08/23/2016  ? Procedure: ESOPHAGOGASTRODUODENOSCOPY (EGD) WITH PROPOFOL;  Surgeon: Alphonsa Overall, MD;  [facility];  Service: General;  Laterality: N/A;  ? FLEXIBLE SIGMOIDOSCOPY  01/20/2012  ? RMR: incomplete/attempted colonoscopy. Inadequate prep precluded examination  ? HEEL SPUR SURGERY Left 09/11/2013  ? both have been done  ? KNEE ARTHROSCOPY  02/04/2004  ?  left knee/partial medial meniscectomy.  ? KNEE SURGERY    ? 3 arthroscopic  2 on left 1 on right  ?  LAPAROSCOPIC GASTRIC BANDING  2009  ? LAPAROSCOPIC GASTRIC SLEEVE RESECTION N/A 01/08/2020  ? Procedure: LAPAROSCOPIC SLEEVE GASTRECTOMY;  Surgeon: Alphonsa Overall, MD;  [facility];  Service: General;  Laterality: N/A;  ? LAPAROSCOPIC SALPINGOOPHERECTOMY  03/30/2002  ? TOTAL KNEE ARTHROPLASTY Left 03/27/2021  ? Procedure: TOTAL KNEE ARTHROPLASTY;  Surgeon: Netta Cedars, MD;  [facility];  Service: Orthopedics;  Laterality: Left;  ? TOTAL KNEE ARTHROPLASTY Right 07/10/2021  ? Procedure: TOTAL KNEE ARTHROPLASTY;  Surgeon: Netta Cedars, MD;  [facility];  Service: Orthopedics;  Laterality: Right;  ? TUBAL LIGATION    ? ?Patient  Active Problem List  ? Diagnosis Date Noted  ? Status post total knee replacement, right 07/10/2021  ? Vitamin D deficiency 07/02/2021  ? H/O total knee replacement, left 03/27/2021  ? Morbid obesity with BMI of 45.0-49.9, adult ([place]) 01/08/2020  ? Uncontrolled type 2 diabetes mellitus with hyperglycemia ([place]) 05/21/2019  ? Essential hypertension, benign 05/21/2019  ? Mixed hyperlipidemia 05/21/2019  ? Plantar fasciitis of right foot 06/14/2018  ? Osteoarthritis of left knee 04/05/2018  ? Osteoarthritis of right knee 04/05/2018  ? Hx of adenomatous colonic polyps 06/04/2016  ? LLQ pain 12/31/2014  ? Abdominal pain, RLQ (right lower quadrant) 04/24/2014  ? OSA on CPAP 10/18/2013  ? Alcohol abuse 01/01/2013  ? Migraine without aura, with intractable migraine, so stated, without mention of status migrainosus 09/21/2012  ? Memory loss 09/21/2012  ? Lapband APL Nov 2009 06/21/2012  ? Outbursts of anger 03/31/2012  ? FH: colon cancer 11/03/2011  ? Rectal bleed 11/03/2011  ? Esophageal dysphagia 11/03/2011  ? COLONIC POLYPS 10/10/2008  ? DIABETES MELLITUS, TYPE II 09/02/2008  ? Obesity 09/02/2008  ? ANXIETY NEUROSIS 09/02/2008  ? Severe mood disorder without psychotic features ([place]) 09/02/2008  ? GERD 09/02/2008  ? Constipation 09/02/2008  ? OSTEOARTHRITIS 09/02/2008  ? ? ?REFERRING DIAG: M17.11 unilateral primary osteoarthritis, rt knee per Esmond Plants, MD  ? ?THERAPY DIAG:  ?Right knee pain, unspecified chronicity ? ?Other abnormalities of gait and mobility ? ?PERTINENT HISTORY: Right TKA 07/10/21 ? ?PRECAUTIONS: none ? ?SUBJECTIVE: Pt stated she returned to the [facility] and complete silver sneakers and felt good.  Stated she over did it Sunday at cookout, increased pain Monday. ? ?PAIN:  ?PAIN:  ?Are you having pain? No ? ? ?  ? Treatment today  ?4/423: ?Bike 5 min seat 10 for mobility ? ?Standing: ?Squat 10x 2 sets ?Knee drive on 21JH step  5x 10" ?7in step reciprocal 5RT ?Sidestep RTB 3RT ?Vector stance 3x5" ? ?Prone:  contract/relax for flexion 5x10" ? Quad stretch 3x 30" ? ?Supine: AROM: 0-114 ? ?Manual: Retrograde massage for edema control ? Prone contract/relax for flexion 5x10" ? ? ?08/06/21 ?Bike 4 min lv 3 for ROM  ?Heel raises x20 ?Calf stretch 3 x 30" on steps ?TKE 3 plates 2 E17  ?Gait training 2 RT in clinic no AD  ?Standing hip abduction x20 each  ?Knee driver on 2nd step 5 x 10"  ? ?Manual PROM knee flexion (patient in supine) AROM: 0-106 degrees ? ?08/04/21 ?Bike 4 min for ROM  ?Heel raises x20 ?Calf stretch 3 x 30" on steps ?Step ups 6 inch 2 x 10 HHA x 2  ?Step downs 4 inch 2 x 10 HHA x 2  ?TKE 3 plates 2 E08  ?Gait training 1 RT in clinic  ?Standing hip abduction x20 each  ?Knee driver on 2nd step 5 x 10"  ? ?Manual PROM knee  flexion (patient in supine) AROM: 0-109 degrees ? ?07/30/21: ? Bike seat 10 full revolution ?Standing: ? Knee drive on 2nd step 5x 10" ? Hamstring stretch standing 2nd step 5x 30" ? TKE 10x 5" GTB ? Heel raises ? Hamstring curls 10x ?5 STS no HHA ?Supine: heel slide 10x ? AROM 4-107 degrees ? ?Manual: Decongestive technqiues for edema control ? ?  PT Education   ?  ?  Education Details On exercise form and function   ?  Person(s) Educated Patient   ?  Methods Explanation;Handout   ?  Comprehension Verbalized understanding   ?  ?   ?  ?  ?   ? ?  ?  PT Short Term Goals - 07/14/21 1611   ?  ?    ?     ?  PT SHORT TERM GOAL #1  ?  Title Patient will be independent with initial HEP and self-management strategies to improve functional outcomes   ?  Time 3   ?  Period Weeks   ?  Status Ongoing  ?  Target Date 07/28/21   ?  ?   ?  ?  ?   ?  ?  ?  ?  PT Long Term Goals - 07/14/21 1611   ?  ?    ?     ?  PT LONG TERM GOAL #1  ?  Title Patient will have RT knee AROM 0-120 degrees to improve functional mobility and facilitate squatting to pick up items from floor.   ?  Time 6   ?  Period Weeks   ?  Status Ongoing  ?  Target Date 08/25/21   ?     ?  PT LONG TERM GOAL #2  ?  Title Patient will report at  least 75% overall improvement in subjective complaint to indicate improvement in ability to perform ADLs.   ?  Time 6   ?  Period Weeks   ?  Status Ongoing  ?  Target Date 08/25/21   ?     ?  PT LONG TERM GOAL

## 2021-08-12 ENCOUNTER — Telehealth (INDEPENDENT_AMBULATORY_CARE_PROVIDER_SITE_OTHER): Payer: Medicare Other | Admitting: Psychiatry

## 2021-08-12 ENCOUNTER — Encounter (HOSPITAL_COMMUNITY): Payer: Self-pay | Admitting: Psychiatry

## 2021-08-12 DIAGNOSIS — F3162 Bipolar disorder, current episode mixed, moderate: Secondary | ICD-10-CM | POA: Diagnosis not present

## 2021-08-12 MED ORDER — ARIPIPRAZOLE 10 MG PO TABS: 10.0000 mg | ORAL_TABLET | Freq: Every day | ORAL | 2 refills | Status: DC

## 2021-08-12 MED ORDER — ALPRAZOLAM 1 MG PO TABS
1.0000 mg | ORAL_TABLET | Freq: Three times a day (TID) | ORAL | 1 refills | Status: DC | PRN
Start: 2021-08-12 — End: 2021-11-20

## 2021-08-12 MED ORDER — TRAZODONE HCL 100 MG PO TABS: 100.0000 mg | ORAL_TABLET | Freq: Every day | ORAL | 3 refills | Status: DC

## 2021-08-12 MED ORDER — ESCITALOPRAM OXALATE 20 MG PO TABS: 20.0000 mg | ORAL_TABLET | Freq: Every day | ORAL | 2 refills | Status: DC

## 2021-08-12 NOTE — Progress Notes (Signed)
Virtual Visit via Video Note ? ?I connected with [name] on 08/12/21 at  1:40 PM EDT by a video enabled telemedicine application and verified that I am speaking with the correct person using two identifiers. ? ?Location: ?Patient: home ?Provider: office ?  ?I discussed the limitations of evaluation and management by telemedicine and the availability of in person appointments. The patient expressed understanding and agreed to proceed. ? ? ?  ?I discussed the assessment and treatment plan with the patient. The patient was provided an opportunity to ask questions and all were answered. The patient agreed with the plan and demonstrated an understanding of the instructions. ?  ?The patient was advised to call back or seek an in-person evaluation if the symptoms worsen or if the condition fails to improve as anticipated. ? ?I provided 20 minutes of non-face-to-face time during this encounter. ? ? ?Levonne Spiller, MD ? ?BH MD/PA/NP OP Progress Note ? ?08/12/2021 1:52 PM ?[name]  ?MRN:  [id number] ? ?Chief Complaint:  ?Chief Complaint  ?Patient presents with  ? Depression  ? Follow-up  ? ?HPI: This patient is a 61 year old married white female who lives with her husband in [place].  She has 2 sons who live outside the home.  She is on disability. ? ?The patient returns after 3 months regarding depression anxiety and possible bipolar disorder.  She had had a left knee replacement last November and then had her right knee replaced about 6 weeks ago.  She has been having a lot of pain despite doing physical therapy.  She states that her pharmacies cannot get the temazepam in stock so consequently she is not sleeping well.  The pain is also keeping her awake.  She is no longer on any prescription pain medicine.  She does feel hopeful that once she gets to the rehab her knee will be better and she will be more active and mobile.  I offered to switch her back to trazodone and she is willing to give this a try.   Overall her mood has been stable and she denies significant depression anxiety mood swings anger erratic behavior.  She denies thoughts of self-harm or suicide ?Visit Diagnosis:  ?  ICD-10-CM   ?1. Bipolar 1 disorder, mixed, moderate ([place])  F31.62   ?  ? ? ?Past Psychiatric History: Long-term outpatient treatment ? ?Past Medical History:  ?Past Medical History:  ?Diagnosis Date  ? Adenomatous polyp 2009  ? Anxiety   ? Arthritis   ? Bipolar 1 disorder ([place])   ? Constipation   ? Depression   ? Diabetes mellitus   ? Diabetes mellitus, type II ([place])   ? Diverticula of colon 2009  ? Generalized headaches   ? GERD (gastroesophageal reflux disease)   ? History of hiatal hernia   ? small  ? Hypertension   ? no meds  ? Migraine   ? Neuropathy   ? both feet  ? Obesity   ? Pelvic floor dysfunction   ? abnormal anorectal manometry at [facility] in 2009  ? Plantar fasciitis both feet  ? Sleep apnea   ? cpap setting of 3.5  ?  ?Past Surgical History:  ?Procedure Laterality Date  ? ABDOMINAL HYSTERECTOMY    ? compete  ? CATARACT EXTRACTION W/PHACO Right 02/15/2017  ? Procedure: CATARACT EXTRACTION PHACO AND INTRAOCULAR LENS PLACEMENT (IOC);  Surgeon: Rutherford Guys, MD;  [facility]: [facility];  Service: Ophthalmology;  Laterality: Right;  CDE: 4.18  ? CATARACT EXTRACTION W/PHACO  Left 03/01/2017  ? Procedure: CATARACT EXTRACTION PHACO AND INTRAOCULAR LENS PLACEMENT (I[facility]);  Surgeon: Rutherford Guys, MD;  Location: [facility];  Service: Ophthalmology;  Laterality: Left;  CDE: 2.56  ? COLON RESECTION    03/30/2002  ? with end-colostomy and Hartmann's pouch  ? COLON SURGERY    ? complicated diverticulitis requiring sigmoid resection with colostomy and subsequent takedown  ? COLONOSCOPY  12/11/2007  ? Dr. Gala Romney- marginal prep, normal rectum pancolonic diverticula, adenomatous polyp  ? COLONOSCOPY  08/28/2003    ?  Wide open colonic anastomosis/Scattered diverticula noted throughout colon/ Small external hemorrhoids  ? COLONOSCOPY  04/2012  ? [facility]:  hyperplastic polyps, diverticulosis, ileocolonic anastomosis.  ? COLONOSCOPY WITH PROPOFOL N/A 06/07/2016  ? Procedure: COLONOSCOPY WITH PROPOFOL;  Surgeon: Daneil Dolin, MD;  [facility];  Service: Endoscopy;  Laterality: N/A;  7:30 am  ? COLOSTOMY CLOSURE  07/10/2002  ? ESOPHAGOGASTRODUODENOSCOPY N/A 01/08/2020  ? Procedure: UPPER GASTROINTESTINAL ENDOSCOPY;  Surgeon: Alphonsa Overall, MD;  [facility];  Service: General;  Laterality: N/A;  ? ESOPHAGOGASTRODUODENOSCOPY (EGD) WITH PROPOFOL N/A 08/23/2016  ? Procedure: ESOPHAGOGASTRODUODENOSCOPY (EGD) WITH PROPOFOL;  Surgeon: Alphonsa Overall, MD;  [facility];  Service: General;  Laterality: N/A;  ? FLEXIBLE SIGMOIDOSCOPY  01/20/2012  ? RMR: incomplete/attempted colonoscopy. Inadequate prep precluded examination  ? HEEL SPUR SURGERY Left 09/11/2013  ? both have been done  ? KNEE ARTHROSCOPY  02/04/2004  ?  left knee/partial medial meniscectomy.  ? KNEE SURGERY    ? 3 arthroscopic  2 on left 1 on right  ? LAPAROSCOPIC GASTRIC BANDING  2009  ? LAPAROSCOPIC GASTRIC SLEEVE RESECTION N/A 01/08/2020  ? Procedure: LAPAROSCOPIC SLEEVE GASTRECTOMY;  Surgeon: Alphonsa Overall, MD;  [facility];  Service: General;  Laterality: N/A;  ? LAPAROSCOPIC SALPINGOOPHERECTOMY  03/30/2002  ? TOTAL KNEE ARTHROPLASTY Left 03/27/2021  ? Procedure: TOTAL KNEE ARTHROPLASTY;  Surgeon: Netta Cedars, MD;  [facility];  Service: Orthopedics;  Laterality: Left;  ? TOTAL KNEE ARTHROPLASTY Right 07/10/2021  ? Procedure: TOTAL KNEE ARTHROPLASTY;  Surgeon: Netta Cedars, MD;  [facility];  Service: Orthopedics;  Laterality: Right;  ? TUBAL LIGATION    ? ? ?Family Psychiatric History: see below ? ?Family History:  ?Family History  ?Problem Relation Age of Onset  ? Ovarian cancer Mother   ? Heart disease Mother   ? Anxiety disorder Mother   ? Cirrhosis Father   ?     deceased age 26  ? Alcohol abuse Father   ? Liver cancer Cousin   ?     age 18, deceased  ? Drug abuse  Cousin   ? ADD / ADHD Son   ? Anxiety disorder Sister   ? Dementia Maternal Grandfather   ? Colon cancer Other   ?     aunt, deceased age 34  ? Breast cancer Other   ?     aunt, deceased age 35  ? Bipolar disorder Neg Hx   ? Depression Neg Hx   ? OCD Neg Hx   ? Paranoid behavior Neg Hx   ? Schizophrenia Neg Hx   ? Seizures Neg Hx   ? Sexual abuse Neg Hx   ? Physical abuse Neg Hx   ? ? ?Social History:  ?Social History  ? ?Socioeconomic History  ? Marital status: Married  ?  Spouse name: Not on file  ? Number of children: 2  ? Years of education: college  ? Highest education level: Not on  file  ?Occupational History  ? Occupation: Ship broker at [employer]  ?  Employer: UNEMPLOYED  ?  Employer: DISABLED  ?Tobacco Use  ? Smoking status: Never  ? Smokeless tobacco: Never  ?Vaping Use  ? Vaping Use: Never used  ?Substance and Sexual Activity  ? Alcohol use: Not Currently  ? Drug use: Never  ? Sexual activity: Not Currently  ?  Birth control/protection: Surgical  ?Other Topics Concern  ? Not on file  ?Social History Narrative  ? Not on file  ? ?Social Determinants of Health  ? ?Financial Resource Strain: Not on file  ?Food Insecurity: Not on file  ?Transportation Needs: Not on file  ?Physical Activity: Not on file  ?Stress: Not on file  ?Social Connections: Not on file  ? ? ?Allergies:  ?Allergies  ?Allergen Reactions  ? Bactrim Itching, Nausea And Vomiting and Rash  ?  Bactrim brand name. Redness.  ? ? ?Metabolic Disorder Labs: ?Lab Results  ?Component Value Date  ? HGBA1C 6.5 (H) 06/29/2021  ? MPG 140 06/29/2021  ? MPG 139.85 03/17/2021  ? ?No results found for: PROLACTIN ?Lab Results  ?Component Value Date  ? CHOL 186 06/15/2021  ? TRIG 157 (H) 06/15/2021  ? HDL 54 06/15/2021  ? CHOLHDL 3.4 06/15/2021  ? LDLCALC 105 (H) 06/15/2021  ? LDLCALC 101 (H) 08/13/2020  ? ?Lab Results  ?Component Value Date  ? TSH 1.060 06/15/2021  ? TSH 1.310 08/13/2020  ? ? ?Therapeutic Level Labs: ?No results found for: LITHIUM ?No results found  for: VALPROATE ?No components found for:  CBMZ ? ?Current Medications: ?Current Outpatient Medications  ?Medication Sig Dispense Refill  ? traZODone (DESYREL) 100 MG tablet Take 1 tablet (100 mg total) by mouth at bed

## 2021-08-13 ENCOUNTER — Encounter (HOSPITAL_COMMUNITY): Payer: Self-pay

## 2021-08-13 ENCOUNTER — Ambulatory Visit (HOSPITAL_COMMUNITY): Payer: Medicare Other

## 2021-08-13 DIAGNOSIS — M25561 Pain in right knee: Secondary | ICD-10-CM

## 2021-08-13 DIAGNOSIS — R2689 Other abnormalities of gait and mobility: Secondary | ICD-10-CM

## 2021-08-13 NOTE — Therapy (Addendum)
[place], [place], [address] Phone: [phone]   Fax:  [phone]  Physical Therapy Treatment  Patient Details  Name: [name] MRN: [id number] Date of Birth: 12-Apr-1961 Referring Provider (PT): Netta Cedars MD  PHYSICAL THERAPY DISCHARGE SUMMARY  Visits from Start of Care: 8  Current functional level related to goals / functional outcomes: See below   Remaining deficits: See below   Education / Equipment: See assessment   Patient agrees to discharge. Patient goals were met. Patient is being discharged due to meeting the stated rehab goals.  Encounter Date: 08/13/2021   PT End of Session - 08/13/21 1651     Visit Number 8    Number of Visits 12    Date for PT Re-Evaluation 08/25/21    Authorization Type [employer] medicare no auth - med nec./ [employer] 2ndary    Progress Note Due on Visit 10    PT Start Time 1617    PT Stop Time 1655    PT Time Calculation (min) 38 min    Activity Tolerance Patient tolerated treatment well    Behavior During Therapy WFL for tasks assessed/performed             Past Medical History:  Diagnosis Date   Adenomatous polyp 2009   Anxiety    Arthritis    Bipolar 1 disorder ([place])    Constipation    Depression    Diabetes mellitus    Diabetes mellitus, type II ([place])    Diverticula of colon 2009   Generalized headaches    GERD (gastroesophageal reflux disease)    History of hiatal hernia    small   Hypertension    no meds   Migraine    Neuropathy    both feet   Obesity    Pelvic floor dysfunction    abnormal anorectal [facility]ometry at [facility] in 2009   Plantar fasciitis both feet   Sleep apnea    cpap setting of 3.5    Past Surgical History:  Procedure Laterality Date   ABDOMINAL HYSTERECTOMY     compete   CATARACT EXTRACTION W/PHACO Right 02/15/2017   Procedure: CATARACT EXTRACTION PHACO AND INTRAOCULAR LENS PLACEMENT ([place]);  Surgeon: Rutherford Guys, MD;   [facility];  Service: Ophthalmology;  Laterality: Right;  CDE: 4.18   CATARACT EXTRACTION W/PHACO Left 03/01/2017   Procedure: CATARACT EXTRACTION PHACO AND INTRAOCULAR LENS PLACEMENT (IOC);  Surgeon: Rutherford Guys, MD;  [facility];  Service: Ophthalmology;  Laterality: Left;  CDE: 2.56   COLON RESECTION    03/30/2002   with end-colostomy and Hartmann's pouch   COLON SURGERY     complicated diverticulitis requiring sigmoid resection with colostomy and subsequent takedown   COLONOSCOPY  12/11/2007   Dr. Gala Romney- marginal prep, normal rectum pancolonic diverticula, adenomatous polyp   COLONOSCOPY  08/28/2003      Wide open colonic anastomosis/Scattered diverticula noted throughout colon/ Small external hemorrhoids   COLONOSCOPY  04/2012   [facility]: hyperplastic polyps, diverticulosis, ileocolonic anastomosis.   COLONOSCOPY WITH PROPOFOL N/A 06/07/2016   Procedure: COLONOSCOPY WITH PROPOFOL;  Surgeon: Daneil Dolin, MD;  [facility];  Service: Endoscopy;  Laterality: N/A;  7:30 am   COLOSTOMY CLOSURE  07/10/2002   ESOPHAGOGASTRODUODENOSCOPY N/A 01/08/2020   Procedure: UPPER GASTROINTESTINAL ENDOSCOPY;  Surgeon: Alphonsa Overall, MD;  [facility];  Service: General;  Laterality: N/A;   ESOPHAGOGASTRODUODENOSCOPY (EGD) WITH PROPOFOL N/A 08/23/2016   Procedure: ESOPHAGOGASTRODUODENOSCOPY (EGD)  WITH PROPOFOL;  Surgeon: Alphonsa Overall, MD;  [facility];  Service: General;  Laterality: N/A;   FLEXIBLE SIGMOIDOSCOPY  01/20/2012   RMR: incomplete/attempted colonoscopy. Inadequate prep precluded examination   HEEL SPUR SURGERY Left 09/11/2013   both have been done   KNEE ARTHROSCOPY  02/04/2004    left knee/partial medial meniscectomy.   KNEE SURGERY     3 arthroscopic  2 on left 1 on right   LAPAROSCOPIC GASTRIC BANDING  2009   LAPAROSCOPIC GASTRIC SLEEVE RESECTION N/A 01/08/2020   Procedure: LAPAROSCOPIC SLEEVE GASTRECTOMY;  Surgeon: Alphonsa Overall, MD;  [facility];   Service: General;  Laterality: N/A;   LAPAROSCOPIC SALPINGOOPHERECTOMY  03/30/2002   TOTAL KNEE ARTHROPLASTY Left 03/27/2021   Procedure: TOTAL KNEE ARTHROPLASTY;  Surgeon: Netta Cedars, MD;  [facility];  Service: Orthopedics;  Laterality: Left;   TOTAL KNEE ARTHROPLASTY Right 07/10/2021   Procedure: TOTAL KNEE ARTHROPLASTY;  Surgeon: Netta Cedars, MD;  [facility];  Service: Orthopedics;  Laterality: Right;   TUBAL LIGATION      There were no vitals filed for this visit.   Subjective Assessment - 08/13/21 1620     Subjective Pt stated she has increased back pain, has been cleaning windows.  Stated this session is her 2nd workout for the day, did silver sneakers earlier today.    Pertinent History Rt TKA 07/10/21    Patient Stated Goals To not be hurting and lose 40-50 lbs, exercise more    Currently in Pain? Yes    Pain Score 2     Pain Orientation Right    Pain Descriptors / Indicators Dull    Pain Type Surgical pain    Pain Onset In the past 7 days    Pain Frequency Intermittent                [facility] PT Assessment - 08/13/21 0001       Assessment   Medical Diagnosis RT TKA    Referring Provider (PT) Netta Cedars MD    Onset Date/Surgical Date 07/10/21    Next MD Visit 08/19/21      Observation/Other Assessments   Focus on Therapeutic Outcomes (FOTO)  61.72% functional was 28%      AROM   AROM Assessment Site Hip;Knee    Right/Left Hip Right    Right/Left Knee Right    Right Knee Extension 0   was -10   Right Knee Flexion 118   was 78     Strength   Strength Assessment Site Hip;Knee    Right/Left Hip Right    Right Hip Flexion 5/5    Right Hip Extension 4+/5    Right Hip ABduction 5/5    Left Knee Flexion 5/5    Left Knee Extension 4+/5      Standardized Balance Assessment   Standardized Balance Assessment Five Times Sit to Stand   9.95"                      2MWT 5RT reciprocal pattern stairs with 1 HR ascend and 2 descend 5STS  in 9.55" Bike 5 min seat 10 while answering FOTO survey MMT AROM 0-118 degrees Rt knee    [facility] Adult PT Treatment/Exercise - 08/13/21 0001       Ambulation/Gait   Ambulation/Gait Yes    Ambulation/Gait Assistance 6: Modified independent (Device/Increase time)    Ambulation Distance (Feet) 408 Feet    Assistive device None    Gait  Pattern Within Functional Limits    Ambulation Surface Level;Indoor    Stairs Yes    Stairs Assistance 7: Independent    Stair Management Technique Alternating pattern;Two rails;One rail Right    Number of Stairs 5   5RT   Height of Stairs 7    Gait Comments 2MWT                       PT Short Term Goals - 08/13/21 1611       PT SHORT TERM GOAL #1   Title Patient will be independent with initial HEP and self-management strategies to improve functional outcomes    Baseline 08/13/21:  Reports compliance with HEP daily.    Status Achieved      PT SHORT TERM GOAL #2   Title Patient will report at least 40% overall improvement in subjective complaint to indicate improvement in ability to perform ADLs.    Baseline 08/13/21:  Reports improvements by 80%    Status Achieved      PT SHORT TERM GOAL #3   Title Patient will have LT knee AROM 5-100 degrees to improve functional mobility and facilitate squatting to pick up items from floor.    Baseline 08/13/21: 0-118 degrees    Status Achieved               PT Long Term Goals - 08/13/21 1627       PT LONG TERM GOAL #1   Title Patient will have RT knee AROM 0-120 degrees to improve functional mobility and facilitate squatting to pick up items from floor.    Baseline 08/13/21: 0-118 degrees was -10-70    Status Partially Met      PT LONG TERM GOAL #2   Title Patient will report at least 75% overall improvement in subjective complaint to indicate improvement in ability to perform ADLs.    Baseline 08/13/21:  80% improved    Status Achieved      PT LONG TERM GOAL #3   Title Patient will  improve FOTO score to predicted value to indicate improvement in functional outcomes    Baseline 08/13/21: 61.72% was 28% function    Status Achieved      PT LONG TERM GOAL #4   Title Patient will be independent with advanced HEP and self-management strategies to improve functional outcomes    Baseline 08/13/21:  Reports compliance with advanced HEP    Status Achieved      PT LONG TERM GOAL #5   Title Patient will have RT knee MMT equal to or > 4+/5 MMT for improved ability to perform functional mobility, stair ambulation and ADLs.    Baseline 08/13/21:  see MMT    Status Achieved                   Plan - 08/13/21 1706     Clinical Impression Statement Pt arrived wiht smile on face, reports she has returned to [facility] for silver sneakers and bike for knee.  Pt stated she feels ready for DC.  Reviewed goals with objective testing including: MMT, ROM, 2MWT, stair navigation, and FOTO survey improved from 28% to 61.72% functional.  Pt presents with improved functional strength, AROM 0-118 degrees and feels she has improved by 80% since beginning therapy.  Reviewed exercises to continue at home.    Examination-Activity Limitations Bend;Squat;Stairs;Stand;Carry;Transfers;Dressing;Hygiene/Grooming;Lift;Locomotion Level;Toileting;Sleep;Bed Mobility;Bathing;Sit    Examination-Participation Restrictions Community Activity;Shop;Cleaning;Yard Work;Meal Prep;Laundry;Driving    Stability/Clinical Decision Making Stable/Uncomplicated  Clinical Decision Making Low    Rehab Potential Good    PT Frequency 2x / week    PT Duration 6 weeks    PT Treatment/Interventions ADLs/Self Care Home Management;Aquatic Therapy;Fluidtherapy;Parrafin;Ultrasound;Neuromuscular re-education;Cognitive remediation;Contrast Bath;DME Instruction;Patient/family education;Biofeedback;Gait training;Stair training;Cryotherapy;Electrical Stimulation;Functional mobility training;Orthotic Fit/Training;Therapeutic  activities;Iontophoresis 4mg /ml Dexamethasone;Manual techniques;Therapeutic exercise;Moist Heat;Traction;Balance training;Manual lymph drainage;Vasopneumatic Device;Taping;Splinting;Energy conservation;Joint Manipulations;Dry needling;Passive range of motion;Spinal Manipulations;Visual/perceptual remediation/compensation;Scar mobilization;Compression bandaging    PT Next Visit Plan DC to HEP.    Consulted and Agree with Plan of Care Patient             Patient will benefit from skilled therapeutic intervention in order to improve the following deficits and impairments:  Abnormal gait, Hypomobility, Increased edema, Decreased activity tolerance, Decreased strength, Pain, Increased fascial restricitons, Decreased scar mobility, Decreased balance, Decreased mobility, Difficulty walking, Decreased range of motion, Impaired flexibility, Improper body mechanics  Visit Diagnosis: Right knee pain, unspecified chronicity  Other abnormalities of gait and mobility     Problem List Patient Active Problem List   Diagnosis Date Noted   Status post total knee replacement, right 07/10/2021   Vitamin D deficiency 07/02/2021   H/O total knee replacement, left 03/27/2021   Morbid obesity with BMI of 45.0-49.9, adult ([place]) 01/08/2020   Uncontrolled type 2 diabetes mellitus with hyperglycemia ([place]) 05/21/2019   Essential hypertension, benign 05/21/2019   Mixed hyperlipidemia 05/21/2019   Plantar fasciitis of right foot 06/14/2018   Osteoarthritis of left knee 04/05/2018   Osteoarthritis of right knee 04/05/2018   Hx of adenomatous colonic polyps 06/04/2016   LLQ pain 12/31/2014   Abdominal pain, RLQ (right lower quadrant) 04/24/2014   OSA on CPAP 10/18/2013   Alcohol abuse 01/01/2013   Migraine without aura, with intractable migraine, so stated, without mention of status migrainosus 09/21/2012   Memory loss 09/21/2012   Lapband APL Nov 2009 06/21/2012   Outbursts of anger 03/31/2012   FH: colon  cancer 11/03/2011   Rectal bleed 11/03/2011   Esophageal dysphagia 11/03/2011   COLONIC POLYPS 10/10/2008   DIABETES MELLITUS, TYPE II 09/02/2008   Obesity 09/02/2008   ANXIETY NEUROSIS 09/02/2008   Severe mood disorder without psychotic features ([place]) 09/02/2008   GERD 09/02/2008   Constipation 09/02/2008   OSTEOARTHRITIS 09/02/2008   Ihor Austin, LPTA/CLT; CBIS [phone]  Aldona Lento, PTA 08/13/2021, 6:03 PM  [facility] [address] [place], [place], [address] Phone: [phone]   Fax:  [phone]  Name: [name] MRN: [id number] Date of Birth: 02/05/1961

## 2021-09-08 DIAGNOSIS — M79671 Pain in right foot: Secondary | ICD-10-CM | POA: Diagnosis not present

## 2021-09-08 DIAGNOSIS — M25512 Pain in left shoulder: Secondary | ICD-10-CM | POA: Diagnosis not present

## 2021-09-10 DIAGNOSIS — E1165 Type 2 diabetes mellitus with hyperglycemia: Secondary | ICD-10-CM | POA: Diagnosis not present

## 2021-09-11 DIAGNOSIS — M159 Polyosteoarthritis, unspecified: Secondary | ICD-10-CM | POA: Diagnosis not present

## 2021-09-11 DIAGNOSIS — I10 Essential (primary) hypertension: Secondary | ICD-10-CM | POA: Diagnosis not present

## 2021-09-11 DIAGNOSIS — Z0001 Encounter for general adult medical examination with abnormal findings: Secondary | ICD-10-CM | POA: Diagnosis not present

## 2021-09-11 DIAGNOSIS — K219 Gastro-esophageal reflux disease without esophagitis: Secondary | ICD-10-CM | POA: Diagnosis not present

## 2021-09-11 DIAGNOSIS — E782 Mixed hyperlipidemia: Secondary | ICD-10-CM | POA: Diagnosis not present

## 2021-09-14 ENCOUNTER — Telehealth: Payer: Self-pay | Admitting: "Endocrinology

## 2021-09-14 DIAGNOSIS — E1165 Type 2 diabetes mellitus with hyperglycemia: Secondary | ICD-10-CM

## 2021-09-14 MED ORDER — DULAGLUTIDE 3 MG/0.5ML ~~LOC~~ SOAJ: 3.0000 mg | SUBCUTANEOUS | 2 refills | Status: DC

## 2021-09-14 NOTE — Telephone Encounter (Signed)
Discussed with pt, understanding voiced. Rx for trulicity '3mg'$  sent to [facility]. ?

## 2021-09-14 NOTE — Telephone Encounter (Signed)
Pt left a Vm requesting a nurse to call back ?

## 2021-09-14 NOTE — Telephone Encounter (Signed)
Pt called stating she had seen her PCP Dr.Golding recently. Stated he talked with her about wanting to switch her from Trulicity to [facility] to help control her diabetes along with weight loss and  advised her to talk with you. States she has had both knees replaced last on being in March and has not been able to increase her activity level as of yet. ?

## 2021-09-24 ENCOUNTER — Ambulatory Visit: Payer: Medicare Other

## 2021-09-29 DIAGNOSIS — M5416 Radiculopathy, lumbar region: Secondary | ICD-10-CM | POA: Diagnosis not present

## 2021-10-11 DIAGNOSIS — E1165 Type 2 diabetes mellitus with hyperglycemia: Secondary | ICD-10-CM | POA: Diagnosis not present

## 2021-10-25 DIAGNOSIS — G4733 Obstructive sleep apnea (adult) (pediatric): Secondary | ICD-10-CM | POA: Diagnosis not present

## 2021-10-25 DIAGNOSIS — G4730 Sleep apnea, unspecified: Secondary | ICD-10-CM | POA: Diagnosis not present

## 2021-10-27 ENCOUNTER — Telehealth: Payer: Self-pay

## 2021-10-27 ENCOUNTER — Telehealth (INDEPENDENT_AMBULATORY_CARE_PROVIDER_SITE_OTHER): Payer: Medicare Other | Admitting: Gastroenterology

## 2021-10-27 ENCOUNTER — Encounter: Payer: Self-pay | Admitting: Gastroenterology

## 2021-10-27 ENCOUNTER — Telehealth: Payer: Self-pay | Admitting: Gastroenterology

## 2021-10-27 VITALS — Ht 67.0 in | Wt 240.0 lb

## 2021-10-27 DIAGNOSIS — R1032 Left lower quadrant pain: Secondary | ICD-10-CM | POA: Diagnosis not present

## 2021-10-27 MED ORDER — CIPROFLOXACIN HCL 500 MG PO TABS: 500.0000 mg | ORAL_TABLET | Freq: Two times a day (BID) | ORAL | 0 refills | Status: AC

## 2021-10-27 MED ORDER — METRONIDAZOLE 500 MG PO TABS: 500.0000 mg | ORAL_TABLET | Freq: Three times a day (TID) | ORAL | 0 refills | Status: AC

## 2021-10-27 NOTE — Progress Notes (Signed)
Primary Care Physician:  Sharilyn Sites, MD  Primary GI: Dr. Gala Romney   Patient Location: Home   Provider Location: [facility] office   Reason for Visit: Follow-up    Persons present on the virtual encounter, with roles: Patient and NP   Total time (minutes) spent on medical discussion: 10 minutes   Due to COVID-19, visit was conducted using virtual method.  Visit was requested by patient.  Virtual Visit via MyChart Video Note Due to COVID-19, visit is conducted virtually and was requested by patient.   I connected with [name] on 11/08/21 at  2:30 PM EDT by video and verified that I am speaking with the correct person using two identifiers.   I discussed the limitations, risks, security and privacy concerns of performing an evaluation and management service by video and the availability of in person appointments. I also discussed with the patient that there may be a patient responsible charge related to this service. The patient expressed understanding and agreed to proceed.  Chief Complaint  Patient presents with   Follow-up    FOLLOW UP ON DIVERTICULITIS      History of Present Illness:  [name] is a 61 year old female presenting today with a history of chronic constipation, complicated diverticulitis in remote past requiring surgery, last colonoscopy 2018 and due for 5-year-surveillance in light of personal history of polyps, presenting virtually with concerns for diverticulitis.    Took laxative yesterday and Linzess. No fever or chills. Last BM on Saturday. Takes stool softener daily and will take Linzess just as needed. Takes coffee with Miralax. Taking 145 mcg will give a blow out.   SHe has had LLQ pain X 1 week. Concerned she has another diverticulitis flare.     Past Medical History:  Diagnosis Date   Adenomatous polyp 2009   Anxiety    Arthritis    Bipolar 1 disorder ([place])    Constipation    Depression    Diabetes mellitus    Diabetes  mellitus, type II ([place])    Diverticula of colon 2009   Generalized headaches    GERD (gastroesophageal reflux disease)    History of hiatal hernia    small   Hypertension    no meds   Migraine    Neuropathy    both feet   Obesity    Pelvic floor dysfunction    abnormal anorectal manometry at [facility] in 2009   Plantar fasciitis both feet   Sleep apnea    cpap setting of 3.5     Past Surgical History:  Procedure Laterality Date   ABDOMINAL HYSTERECTOMY     compete   CATARACT EXTRACTION W/PHACO Right 02/15/2017   Procedure: CATARACT EXTRACTION PHACO AND INTRAOCULAR LENS PLACEMENT ([place]);  Surgeon: Rutherford Guys, MD;  Location: [facility];  Service: Ophthalmology;  Laterality: Right;  CDE: 4.18   CATARACT EXTRACTION W/PHACO Left 03/01/2017   Procedure: CATARACT EXTRACTION PHACO AND INTRAOCULAR LENS PLACEMENT ([facility]);  Surgeon: Rutherford Guys, MD;  Location: [facility];  Service: Ophthalmology;  Laterality: Left;  CDE: 2.56   COLON RESECTION    03/30/2002   with end-colostomy and Hartmann's pouch   COLON SURGERY     complicated diverticulitis requiring sigmoid resection with colostomy and subsequent takedown   COLONOSCOPY  12/11/2007   Dr. Gala Romney- marginal prep, normal rectum pancolonic diverticula, adenomatous polyp   COLONOSCOPY  08/28/2003      Wide open colonic anastomosis/Scattered diverticula noted throughout colon/ Small external hemorrhoids  COLONOSCOPY  04/2012   [facility]: hyperplastic polyps, diverticulosis, ileocolonic anastomosis.   COLONOSCOPY WITH PROPOFOL N/A 06/07/2016   Procedure: COLONOSCOPY WITH PROPOFOL;  Surgeon: Daneil Dolin, MD;  Location: [facility];  Service: Endoscopy;  Laterality: N/A;  7:30 am   COLOSTOMY CLOSURE  07/10/2002   ESOPHAGOGASTRODUODENOSCOPY N/A 01/08/2020   Procedure: UPPER GASTROINTESTINAL ENDOSCOPY;  Surgeon: Alphonsa Overall, MD;  Location: [facility];  Service: General;  Laterality: N/A;   ESOPHAGOGASTRODUODENOSCOPY (EGD) WITH PROPOFOL N/A 08/23/2016    Procedure: ESOPHAGOGASTRODUODENOSCOPY (EGD) WITH PROPOFOL;  Surgeon: Alphonsa Overall, MD;  Location: [facility];  Service: General;  Laterality: N/A;   FLEXIBLE SIGMOIDOSCOPY  01/20/2012   [facility]: incomplete/attempted colonoscopy. Inadequate prep precluded examination   HEEL SPUR SURGERY Left 09/11/2013   both have been done   KNEE ARTHROSCOPY  02/04/2004    left knee/partial medial meniscectomy.   KNEE SURGERY     3 arthroscopic  2 on left 1 on right   LAPAROSCOPIC GASTRIC BANDING  2009   LAPAROSCOPIC GASTRIC SLEEVE RESECTION N/A 01/08/2020   Procedure: LAPAROSCOPIC SLEEVE GASTRECTOMY;  Surgeon: Alphonsa Overall, MD;  Location: [facility];  Service: General;  Laterality: N/A;   LAPAROSCOPIC SALPINGOOPHERECTOMY  03/30/2002   TOTAL KNEE ARTHROPLASTY Left 03/27/2021   Procedure: TOTAL KNEE ARTHROPLASTY;  Surgeon: Netta Cedars, MD;  Location: [facility];  Service: Orthopedics;  Laterality: Left;   TOTAL KNEE ARTHROPLASTY Right 07/10/2021   Procedure: TOTAL KNEE ARTHROPLASTY;  Surgeon: Netta Cedars, MD;  Location: [facility];  Service: Orthopedics;  Laterality: Right;   TUBAL LIGATION       Current Meds  Medication Sig   ALPRAZolam (XANAX) 1 MG tablet Take 1 tablet (1 mg total) by mouth 3 (three) times daily as needed for anxiety.   ARIPiprazole (ABILIFY) 10 MG tablet Take 1 tablet (10 mg total) by mouth at bedtime.   ascorbic acid (VITAMIN C) 500 MG tablet Take 500 mg by mouth daily.   Blood Glucose Monitoring Suppl (FREESTYLE LITE) DEVI Use to measure glucose 4 times a day   CALCIUM-VITAMIN D-VITAMIN K PO Take 1 tablet by mouth daily. Chewable   [EXPIRED] ciprofloxacin (CIPRO) 500 MG tablet Take 1 tablet (500 mg total) by mouth 2 (two) times daily for 7 days.   Continuous Blood Gluc Receiver (FREESTYLE LIBRE 2 READER) DEVI As directed   Continuous Blood Gluc Sensor (FREESTYLE LIBRE 2 SENSOR) MISC 1 Piece by Does not apply route every 14 (fourteen) days.   cycloSPORINE (RESTASIS) 0.05 % ophthalmic emulsion  Place 2 drops into both eyes 2 (two) times daily.    diclofenac (VOLTAREN) 75 MG EC tablet Take 75 mg by mouth 2 (two) times daily.   Dulaglutide 3 MG/0.5ML SOPN Inject 3 mg into the skin once a week.   escitalopram (LEXAPRO) 20 MG tablet Take 1 tablet (20 mg total) by mouth daily.   fluticasone (FLONASE) 50 MCG/ACT nasal spray Place 2 sprays into the nose daily as needed for allergies.    gabapentin (NEURONTIN) 100 MG capsule Take 1 capsule (100 mg total) by mouth 2 (two) times daily.   glucose blood test strip Test 4 times a day, she has freestyle lite meter   Insulin Pen Needle (B-D ULTRAFINE III SHORT PEN) 31G X 8 MM MISC 1 each by Does not apply route as directed.   linaclotide (LINZESS) 145 MCG CAPS capsule Take 145 mcg by mouth daily before breakfast.   loratadine (CLARITIN) 10 MG tablet Take 10 mg by mouth daily.   meclizine (  ANTIVERT) 25 MG tablet Take 25 mg by mouth 2 (two) times daily as needed for dizziness.    Menthol, Topical Analgesic, (ICY HOT EX) Apply 1 application topically 4 (four) times daily as needed (joint pain.).   methocarbamol (ROBAXIN) 500 MG tablet Take 1 tablet (500 mg total) by mouth every 6 (six) hours as needed for muscle spasms.   [EXPIRED] metroNIDAZOLE (FLAGYL) 500 MG tablet Take 1 tablet (500 mg total) by mouth 3 (three) times daily for 7 days.   Multiple Vitamin (MULTIVITAMIN WITH MINERALS) TABS tablet Take 1 tablet by mouth daily. celebrate multivitamin 2 in 1   ondansetron (ZOFRAN) 4 MG tablet Take 1 tablet (4 mg total) by mouth every 8 (eight) hours as needed for nausea, vomiting or refractory nausea / vomiting.   ondansetron (ZOFRAN) 4 MG tablet Take 1 tablet (4 mg total) by mouth every 8 (eight) hours as needed for nausea, vomiting or refractory nausea / vomiting.   pantoprazole (PROTONIX) 40 MG tablet Take 1 tablet (40 mg total) by mouth 2 (two) times daily. (Patient taking differently: Take 40 mg by mouth daily before breakfast.)   Probiotic Product  (PROBIOTIC PO) Take 1 tablet by mouth daily.   Propylene Glycol-Glycerin 0.6-0.6 % SOLN Place 1 drop into both eyes 2 (two) times daily.   rizatriptan (MAXALT-MLT) 10 MG disintegrating tablet Take 10 mg by mouth as needed for migraine. May repeat in 2 hours if needed   traZODone (DESYREL) 100 MG tablet Take 1 tablet (100 mg total) by mouth at bedtime.     Family History  Problem Relation Age of Onset   Ovarian cancer Mother    Heart disease Mother    Anxiety disorder Mother    Cirrhosis Father        deceased age 22   Alcohol abuse Father    Liver cancer Cousin        age 24, deceased   Drug abuse Cousin    ADD / ADHD Son    Anxiety disorder Sister    Dementia Maternal Grandfather    Colon cancer Other        aunt, deceased age 10   Breast cancer Other        aunt, deceased age 50   Bipolar disorder Neg Hx    Depression Neg Hx    OCD Neg Hx    Paranoid behavior Neg Hx    Schizophrenia Neg Hx    Seizures Neg Hx    Sexual abuse Neg Hx    Physical abuse Neg Hx     Social History   Socioeconomic History   Marital status: Married    Spouse name: Not on file   Number of children: 2   Years of education: college   Highest education level: Not on file  Occupational History   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    Employer: DISABLED  Tobacco Use   Smoking status: Never   Smokeless tobacco: Never  Vaping Use   Vaping Use: Never used  Substance and Sexual Activity   Alcohol use: Not Currently   Drug use: Never   Sexual activity: Not Currently    Birth control/protection: Surgical  Other Topics Concern   Not on file  Social History Narrative   Not on file   Social Determinants of Health   Financial Resource Strain: Not on file  Food Insecurity: Not on file  Transportation Needs: Not on file  Physical Activity: Not on file  Stress: Not on file  Social Connections: Not on file       Review of Systems: Gen: Denies fever, chills, anorexia.  Denies fatigue, weakness, weight loss.  CV: Denies chest pain, palpitations, syncope, peripheral edema, and claudication. Resp: Denies dyspnea at rest, cough, wheezing, coughing up blood, and pleurisy. GI: see HPI Derm: Denies rash, itching, dry skin Psych: Denies depression, anxiety, memory loss, confusion. No homicidal or suicidal ideation.  Heme: Denies bruising, bleeding, and enlarged lymph nodes.  Observations/Objective: No distress. Unable to perform physical exam due to video encounter.   Assessment and Plan: 61 year old female presenting today with a history of chronic constipation, complicated diverticulitis in remote past requiring surgery, last colonoscopy 2018 and due for 5-year-surveillance in light of personal history of polyps, presenting virtually with concerns for diverticulitis.   Unfortunately, I am unable to examine her. However, she does note this is similar to prior episodes and seems consistent with possible diverticulitis. Unable to rule out underlying constipation.  Will pursue CT abd/pelvis.  Linzess 72 mcg daily instead of 145 mcg daily.   Return in follow-up in person in 4 weeks to arrange colonoscopy.   Follow Up Instructions:    I discussed the assessment and treatment plan with the patient. The patient was provided an opportunity to ask questions and all were answered. The patient agreed with the plan and demonstrated an understanding of the instructions.   The patient was advised to call back or seek an in-person evaluation if the symptoms worsen or if the condition fails to improve as anticipated.  I provided 10 minutes of face-to-face time during this MyChart Video encounter.  Annitta Needs, PhD, ANP-BC [facility]

## 2021-10-27 NOTE — Telephone Encounter (Signed)
Mindy: needs stat CT abd/pelvis with contrast. Reason: diverticulitis suspected.   Dena: please leave Linzess 72 mcg samples for patient at front. Take 1 before breakfast daily.   Stacey: needs follow-up in 4 weeks, can be urgent.

## 2021-10-27 NOTE — Patient Instructions (Signed)
I have sent in 2 antibiotics for you to take. You will take Cipro twice a day and take Flagyl 3 times a day for a total of 7 days.  I have ordered a CT scan to be done as soon as possible. Please call if any worsening symptoms  We are leaving samples of Linzess 72 micrograms at the front desk. Try this daily instead of the 145 mcg daily. Let us know how this works! I would like for you to have a good bowel movement daily if possible.   We will see you back in about 4 weeks. We can arrange a colonoscopy at that time!  It was a pleasure to see you today. I want to create trusting relationships with patients to provide genuine, compassionate, and quality care. I value your feedback. If you receive a survey regarding your visit,  I greatly appreciate you taking time to fill this out.   Annitta Needs, PhD, ANP-BC [facility]

## 2021-10-27 NOTE — Telephone Encounter (Signed)
Noted   Linzess 72 mcg along with AVS in the bag up front for pt

## 2021-10-27 NOTE — Telephone Encounter (Signed)
.  RGA 

## 2021-10-28 ENCOUNTER — Ambulatory Visit (HOSPITAL_COMMUNITY)
Admission: RE | Admit: 2021-10-28 | Discharge: 2021-10-28 | Disposition: A | Discharge status: Home / Self Care | Payer: Medicare Other | Source: Ambulatory Visit | Attending: Gastroenterology | Admitting: Gastroenterology

## 2021-10-28 ENCOUNTER — Encounter (HOSPITAL_COMMUNITY): Payer: Self-pay | Admitting: Radiology

## 2021-10-28 DIAGNOSIS — R1032 Left lower quadrant pain: Secondary | ICD-10-CM | POA: Diagnosis not present

## 2021-10-28 LAB — POCT I-STAT CREATININE: Creatinine, Ser: 0.7 mg/dL (ref 0.44–1.00)

## 2021-10-28 MED ORDER — IOHEXOL 300 MG/ML  SOLN
100.0000 mL | Freq: Once | INTRAMUSCULAR | Status: AC | PRN
  Administered 2021-10-28: 100 mL via INTRAVENOUS

## 2021-10-28 MED ORDER — IOHEXOL 9 MG/ML PO SOLN
ORAL | Status: AC
  Filled 2021-10-28: qty 1000

## 2021-10-28 NOTE — Telephone Encounter (Signed)
Called pt. She states she took her regular dose of linzess yesterday and still did not have BM. She stated she has not had anything to eat/drink this morning.

## 2021-10-28 NOTE — Telephone Encounter (Signed)
Patient heading to CT now.

## 2021-11-02 NOTE — Telephone Encounter (Signed)
Phoned and advised the pt of her result note and she expressed understanding.  Also the pt wants a Rx for Linzess 72 mcg sent to [facility] on [address].     This was sent back to you in her result note .

## 2021-11-05 ENCOUNTER — Other Ambulatory Visit: Payer: Self-pay | Admitting: Gastroenterology

## 2021-11-05 MED ORDER — LINACLOTIDE 72 MCG PO CAPS: 72.0000 ug | ORAL_CAPSULE | Freq: Every day | ORAL | 3 refills | Status: DC

## 2021-11-05 NOTE — Progress Notes (Signed)
Linzess 72 mcg sent to pharmacy.

## 2021-11-09 ENCOUNTER — Encounter: Payer: Self-pay | Admitting: Gastroenterology

## 2021-11-11 ENCOUNTER — Telehealth: Payer: Self-pay

## 2021-11-11 DIAGNOSIS — E1165 Type 2 diabetes mellitus with hyperglycemia: Secondary | ICD-10-CM | POA: Diagnosis not present

## 2021-11-11 NOTE — Telephone Encounter (Signed)
PA approved for Linzess 72 mcg. (Approval dates are 10/10/2021 through 11/09/2022). Pt has tried and failed Linzess 145 mcg (caused diarrhea), Miralax (did not help). Dx used : K59.04 and K59.00.

## 2021-11-11 NOTE — Telephone Encounter (Signed)
Phoned to [facility] on [address] advised of the pt's Rx being approved and the cost is $38.00.

## 2021-11-13 ENCOUNTER — Telehealth: Payer: Self-pay

## 2021-11-13 NOTE — Telephone Encounter (Signed)
Pt calls today stating she needed her Rx for Linzess written in 90 day supply and I advised her it was and the PA had been approved for a 90 day supply. She stated [facility] could only do 30. I phoned to [facility] they attempted to do it again and the system would not let them do it. Pt wants  Rx sent to [facility] (which was not in the system) I put it in the system for you. Pt wants sent there. I advised the pt that you were out today and Monday and it would be sometime Tuesday before this can happen. Pt states she will be ok with that.

## 2021-11-16 MED ORDER — LINACLOTIDE 72 MCG PO CAPS: 72.0000 ug | ORAL_CAPSULE | Freq: Every day | ORAL | 3 refills | Status: DC

## 2021-11-16 NOTE — Addendum Note (Signed)
Addended by: Annitta Needs on: 11/16/2021 11:15 AM   Modules accepted: Orders

## 2021-11-16 NOTE — Telephone Encounter (Signed)
Completed.

## 2021-11-20 ENCOUNTER — Telehealth (INDEPENDENT_AMBULATORY_CARE_PROVIDER_SITE_OTHER): Payer: Medicare Other | Admitting: Psychiatry

## 2021-11-20 ENCOUNTER — Encounter (HOSPITAL_COMMUNITY): Payer: Self-pay | Admitting: Psychiatry

## 2021-11-20 DIAGNOSIS — F3162 Bipolar disorder, current episode mixed, moderate: Secondary | ICD-10-CM | POA: Diagnosis not present

## 2021-11-20 MED ORDER — TEMAZEPAM 30 MG PO CAPS
30.0000 mg | ORAL_CAPSULE | Freq: Every evening | ORAL | 2 refills | Status: DC | PRN
Start: 2021-11-20 — End: 2022-02-17

## 2021-11-20 MED ORDER — ESCITALOPRAM OXALATE 20 MG PO TABS: 20.0000 mg | ORAL_TABLET | Freq: Every day | ORAL | 2 refills | Status: DC

## 2021-11-20 MED ORDER — ARIPIPRAZOLE 10 MG PO TABS: 10.0000 mg | ORAL_TABLET | Freq: Every day | ORAL | 2 refills | Status: DC

## 2021-11-20 MED ORDER — ALPRAZOLAM 1 MG PO TABS: 1.0000 mg | ORAL_TABLET | Freq: Three times a day (TID) | ORAL | 1 refills | Status: DC | PRN

## 2021-11-20 NOTE — Progress Notes (Signed)
Virtual Visit via Video Note  I connected with [name] on 11/20/21 at 10:40 AM EDT by a video enabled telemedicine application and verified that I am speaking with the correct person using two identifiers.  Location: Patient: home Provider: home office   I discussed the limitations of evaluation and management by telemedicine and the availability of in person appointments. The patient expressed understanding and agreed to proceed.     I discussed the assessment and treatment plan with the patient. The patient was provided an opportunity to ask questions and all were answered. The patient agreed with the plan and demonstrated an understanding of the instructions.   The patient was advised to call back or seek an in-person evaluation if the symptoms worsen or if the condition fails to improve as anticipated.  I provided 20 minutes of non-face-to-face time during this encounter.   Levonne Spiller, MD  [facility] MD/PA/NP OP Progress Note  11/20/2021 11:02 AM [name]  MRN:  [id number]  Chief Complaint:  Chief Complaint  Patient presents with   Depression   Anxiety   Follow-up   HPI: This patient is a 61 year old married white female who lives with her husband in [place].  She has 2 sons who live outside the home.   The patient returns for follow-up after 3 months regarding her bipolar disorder.  She states her husband's mobility is getting worse.  She is hurting her back trying to help lift him out of bed or out of his wheelchair.  He also uses a scooter.  She is trying to get him home PT.  She is also trying to help him lose weight so he can get knee replacement surgery.  Overall however her mood has been stable and she denies significant depression or anxiety.  She does not like the trazodone for sleep because it makes her have vivid nightmares so we will try to get back to Restoril.  She denies any manic symptoms or racing thoughts or impulsivity. Visit Diagnosis:     ICD-10-CM   1. Bipolar 1 disorder, mixed, moderate ([place])  F31.62       Past Psychiatric History: Long-term outpatient treatment  Past Medical History:  Past Medical History:  Diagnosis Date   Adenomatous polyp 2009   Anxiety    Arthritis    Bipolar 1 disorder ([place])    Constipation    Depression    Diabetes mellitus    Diabetes mellitus, type II ([place])    Diverticula of colon 2009   Generalized headaches    GERD (gastroesophageal reflux disease)    History of hiatal hernia    small   Hypertension    no meds   Migraine    Neuropathy    both feet   Obesity    Pelvic floor dysfunction    abnormal anorectal [facility]ometry at [facility] in 2009   Plantar fasciitis both feet   Sleep apnea    cpap setting of 3.5    Past Surgical History:  Procedure Laterality Date   ABDOMINAL HYSTERECTOMY     compete   CATARACT EXTRACTION W/PHACO Right 02/15/2017   Procedure: CATARACT EXTRACTION PHACO AND INTRAOCULAR LENS PLACEMENT ([place]);  Surgeon: Rutherford Guys, MD;  Location: [facility];  Service: Ophthalmology;  Laterality: Right;  CDE: 4.18   CATARACT EXTRACTION W/PHACO Left 03/01/2017   Procedure: CATARACT EXTRACTION PHACO AND INTRAOCULAR LENS PLACEMENT ([facility]);  Surgeon: Rutherford Guys, MD;  Location: [facility];  Service: Ophthalmology;  Laterality: Left;  CDE: 2.56  COLON RESECTION  03/30/2002   with end-colostomy and Hartmann's pouch   COLON SURGERY     complicated diverticulitis requiring sigmoid resection with colostomy and subsequent takedown   COLONOSCOPY  12/11/2007   Dr. Gala Romney- marginal prep, normal rectum pancolonic diverticula, adenomatous polyp   COLONOSCOPY  08/28/2003    Wide open colonic anastomosis/Scattered diverticula noted throughout colon/ Small external hemorrhoids   COLONOSCOPY  04/2012   [facility]: hyperplastic polyps, diverticulosis, ileocolonic anastomosis.   COLONOSCOPY WITH PROPOFOL N/A 06/07/2016   Sigmoid diverticulosis, s/p prior segmental resection. 5 year surveillance. Personal  history of polyps in past.   COLOSTOMY CLOSURE  07/10/2002   ESOPHAGOGASTRODUODENOSCOPY N/A 01/08/2020   Procedure: UPPER GASTROINTESTINAL ENDOSCOPY;  Surgeon: Alphonsa Overall, MD;  Location: [facility];  Service: General;  Laterality: N/A;   ESOPHAGOGASTRODUODENOSCOPY (EGD) WITH PROPOFOL N/A 08/23/2016   Procedure: ESOPHAGOGASTRODUODENOSCOPY (EGD) WITH PROPOFOL;  Surgeon: Alphonsa Overall, MD;  Location: [facility];  Service: General;  Laterality: N/A;   FLEXIBLE SIGMOIDOSCOPY  01/20/2012   RMR: incomplete/attempted colonoscopy. Inadequate prep precluded examination   HEEL SPUR SURGERY Left 09/11/2013   both have been done   KNEE ARTHROSCOPY  02/04/2004    left knee/partial medial meniscectomy.   KNEE SURGERY     3 arthroscopic  2 on left 1 on right   LAPAROSCOPIC GASTRIC BANDING  2009   LAPAROSCOPIC GASTRIC SLEEVE RESECTION N/A 01/08/2020   Procedure: LAPAROSCOPIC SLEEVE GASTRECTOMY;  Surgeon: Alphonsa Overall, MD;  Location: [facility];  Service: General;  Laterality: N/A;   LAPAROSCOPIC SALPINGOOPHERECTOMY  03/30/2002   TOTAL KNEE ARTHROPLASTY Left 03/27/2021   Procedure: TOTAL KNEE ARTHROPLASTY;  Surgeon: Netta Cedars, MD;  Location: [facility];  Service: Orthopedics;  Laterality: Left;   TOTAL KNEE ARTHROPLASTY Right 07/10/2021   Procedure: TOTAL KNEE ARTHROPLASTY;  Surgeon: Netta Cedars, MD;  Location: [facility];  Service: Orthopedics;  Laterality: Right;   TUBAL LIGATION      Family Psychiatric History: See below  Family History:  Family History  Problem Relation Age of Onset   Ovarian cancer Mother    Heart disease Mother    Anxiety disorder Mother    Cirrhosis Father        deceased age 32   Alcohol abuse Father    Liver cancer Cousin        age 44, deceased   Drug abuse Cousin    ADD / ADHD Son    Anxiety disorder Sister    Dementia Maternal Grandfather    Colon cancer Other        aunt, deceased age 58   Breast cancer Other        aunt, deceased age 34   Bipolar disorder Neg Hx     Depression Neg Hx    OCD Neg Hx    Paranoid behavior Neg Hx    Schizophrenia Neg Hx    Seizures Neg Hx    Sexual abuse Neg Hx    Physical abuse Neg Hx     Social History:  Social History   Socioeconomic History   Marital status: Married    Spouse name: Not on file   Number of children: 2   Years of education: college   Highest education level: Not on file  Occupational History   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    Employer: DISABLED  Tobacco Use   Smoking status: Never   Smokeless tobacco: Never  Vaping Use   Vaping Use: Never used  Substance and Sexual  Activity   Alcohol use: Not Currently   Drug use: Never   Sexual activity: Not Currently    Birth control/protection: Surgical  Other Topics Concern   Not on file  Social History Narrative   Not on file   Social Determinants of Health   Financial Resource Strain: Not on file  Food Insecurity: Not on file  Transportation Needs: Not on file  Physical Activity: Not on file  Stress: Not on file  Social Connections: Not on file    Allergies:  Allergies  Allergen Reactions   Bactrim Itching, Nausea And Vomiting and Rash    Bactrim brand name. Redness.    Metabolic Disorder Labs: Lab Results  Component Value Date   HGBA1C 6.5 (H) 06/29/2021   MPG 140 06/29/2021   MPG 139.85 03/17/2021   No results found for: "PROLACTIN" Lab Results  Component Value Date   CHOL 186 06/15/2021   TRIG 157 (H) 06/15/2021   HDL 54 06/15/2021   CHOLHDL 3.4 06/15/2021   LDLCALC 105 (H) 06/15/2021   LDLCALC 101 (H) 08/13/2020   Lab Results  Component Value Date   TSH 1.060 06/15/2021   TSH 1.310 08/13/2020    Therapeutic Level Labs: No results found for: "LITHIUM" No results found for: "VALPROATE" Lab Results  Component Value Date   CBMZ 7.5 09/19/2012   CBMZ 6.6 05/18/2011    Current Medications: Current Outpatient Medications  Medication Sig Dispense Refill   ALPRAZolam (XANAX) 1 MG  tablet Take 1 tablet (1 mg total) by mouth 3 (three) times daily as needed for anxiety. 270 tablet 1   ARIPiprazole (ABILIFY) 10 MG tablet Take 1 tablet (10 mg total) by mouth at bedtime. 90 tablet 2   ascorbic acid (VITAMIN C) 500 MG tablet Take 500 mg by mouth daily.     Blood Glucose Monitoring Suppl (FREESTYLE LITE) DEVI Use to measure glucose 4 times a day 1 each 0   CALCIUM-VITAMIN D-VITAMIN K PO Take 1 tablet by mouth daily. Chewable     Continuous Blood Gluc Receiver (FREESTYLE LIBRE 2 READER) DEVI As directed 1 each 0   Continuous Blood Gluc Sensor (FREESTYLE LIBRE 2 SENSOR) MISC 1 Piece by Does not apply route every 14 (fourteen) days. 2 each 3   cycloSPORINE (RESTASIS) 0.05 % ophthalmic emulsion Place 2 drops into both eyes 2 (two) times daily.      diclofenac (VOLTAREN) 75 MG EC tablet Take 75 mg by mouth 2 (two) times daily.     Dulaglutide 3 MG/0.5ML SOPN Inject 3 mg into the skin once a week. 2 mL 2   escitalopram (LEXAPRO) 20 MG tablet Take 1 tablet (20 mg total) by mouth daily. 90 tablet 2   fluticasone (FLONASE) 50 MCG/ACT nasal spray Place 2 sprays into the nose daily as needed for allergies.      gabapentin (NEURONTIN) 100 MG capsule Take 1 capsule (100 mg total) by mouth 2 (two) times daily. 180 capsule 0   glucose blood test strip Test 4 times a day, she has freestyle lite meter 150 each 2   Insulin Pen Needle (B-D ULTRAFINE III SHORT PEN) 31G X 8 MM MISC 1 each by Does not apply route as directed. 100 each 3   linaclotide (LINZESS) 145 MCG CAPS capsule Take 145 mcg by mouth daily before breakfast.     linaclotide (LINZESS) 72 MCG capsule Take 1 capsule (72 mcg total) by mouth daily before breakfast. 90 capsule 3   loratadine (CLARITIN) 10 MG tablet  Take 10 mg by mouth daily.     meclizine (ANTIVERT) 25 MG tablet Take 25 mg by mouth 2 (two) times daily as needed for dizziness.   2   Menthol, Topical Analgesic, (ICY HOT EX) Apply 1 application topically 4 (four) times daily as  needed (joint pain.).     methocarbamol (ROBAXIN) 500 MG tablet Take 1 tablet (500 mg total) by mouth every 6 (six) hours as needed for muscle spasms. 60 tablet 1   Multiple Vitamin (MULTIVITAMIN WITH MINERALS) TABS tablet Take 1 tablet by mouth daily. celebrate multivitamin 2 in 1     ondansetron (ZOFRAN) 4 MG tablet Take 1 tablet (4 mg total) by mouth every 8 (eight) hours as needed for nausea, vomiting or refractory nausea / vomiting. 30 tablet 1   ondansetron (ZOFRAN) 4 MG tablet Take 1 tablet (4 mg total) by mouth every 8 (eight) hours as needed for nausea, vomiting or refractory nausea / vomiting. 30 tablet 1   pantoprazole (PROTONIX) 40 MG tablet Take 1 tablet (40 mg total) by mouth 2 (two) times daily. (Patient taking differently: Take 40 mg by mouth daily before breakfast.) 180 tablet 3   Probiotic Product (PROBIOTIC PO) Take 1 tablet by mouth daily.     Propylene Glycol-Glycerin 0.6-0.6 % SOLN Place 1 drop into both eyes 2 (two) times daily.     rizatriptan (MAXALT-MLT) 10 MG disintegrating tablet Take 10 mg by mouth as needed for migraine. May repeat in 2 hours if needed     temazepam (RESTORIL) 30 MG capsule Take 1 capsule (30 mg total) by mouth at bedtime as needed for sleep. 90 capsule 2   No current facility-administered medications for this visit.     Musculoskeletal: Strength & Muscle Tone: within normal limits Gait & Station: normal Patient leans: N/A  Psychiatric Specialty Exam: Review of Systems  Musculoskeletal:  Positive for arthralgias and back pain.  All other systems reviewed and are negative.   There were no vitals taken for this visit.There is no height or weight on file to calculate BMI.  General Appearance: Casual and Fairly Groomed  Eye Contact:  Good  Speech:  Clear and Coherent  Volume:  Normal  Mood:  Euthymic  Affect:  Appropriate and Congruent  Thought Process:  Goal Directed  Orientation:  Full (Time, Place, and Person)  Thought Content: WDL    Suicidal Thoughts:  No  Homicidal Thoughts:  No  Memory:  Immediate;   Good Recent;   Good Remote;   Good  Judgement:  Good  Insight:  Fair  Psychomotor Activity:  Normal  Concentration:  Concentration: Good and Attention Span: Good  Recall:  Good  Fund of Knowledge: Good  Language: Good  Akathisia:  No  Handed:  Right  AIMS (if indicated): not done  Assets:  Communication Skills Desire for Improvement Resilience Social Support Talents/Skills  ADL's:  Intact  Cognition: WNL  Sleep:  Fair   Screenings: PHQ2-9    Flowsheet Row Video Visit from 11/20/2021 in [place] Video Visit from 08/12/2021 in [place] Video Visit from 05/14/2021 in [place] Video Visit from 12/30/2020 in [place] Video Visit from 08/14/2020 in [facility] [place]  PHQ-2 Total Score 0 0 1 0 0      Flowsheet Row Video Visit from 11/20/2021 in [place] Video Visit from 08/12/2021 in [place] [facility] (Discharged)  from 07/10/2021 in [place] No Risk No Risk No Risk        Assessment and Plan: This patient is a [age]-year-old female with a history of bipolar disorder.  She is not having very good sleep with the trazodone so we will switch back to Restoril 30 mg at bedtime which her pharmacy now has in stock by her report.  She will continue Lexapro 20 mg daily for depression, Abilify 10 mg at bedtime for mood stabilization and Xanax 1 mg 3 times daily for anxiety.  She will return to see me in 3 months  Collaboration of Care: Collaboration of Care: Primary Care Provider AEB notes will be shared with PCP at patient request  Patient/Guardian was advised Release of  Information must be obtained prior to any record release in order to collaborate their care with an outside provider. Patient/Guardian was advised if they have not already done so to contact the registration department to sign all necessary forms in order for [place] to release information regarding their care.   Consent: Patient/Guardian gives verbal consent for treatment and assignment of benefits for services provided during this visit. Patient/Guardian expressed understanding and agreed to proceed.    Levonne Spiller, MD 11/20/2021, 11:02 AM

## 2021-11-26 ENCOUNTER — Ambulatory Visit: Payer: Medicare Other

## 2021-12-02 ENCOUNTER — Encounter: Payer: Self-pay | Admitting: Gastroenterology

## 2021-12-02 ENCOUNTER — Encounter: Payer: Self-pay | Admitting: *Deleted

## 2021-12-02 ENCOUNTER — Ambulatory Visit (INDEPENDENT_AMBULATORY_CARE_PROVIDER_SITE_OTHER): Payer: Medicare Other | Admitting: Gastroenterology

## 2021-12-02 VITALS — BP 143/91 | HR 92 | Temp 98.1°F | Ht 67.0 in | Wt 250.6 lb

## 2021-12-02 DIAGNOSIS — Z86010 Personal history of colonic polyps: Secondary | ICD-10-CM | POA: Diagnosis not present

## 2021-12-02 DIAGNOSIS — K5900 Constipation, unspecified: Secondary | ICD-10-CM

## 2021-12-02 MED ORDER — CLENPIQ 10-3.5-12 MG-GM -GM/175ML PO SOLN: 1.0000 | ORAL | 0 refills | Status: DC

## 2021-12-02 NOTE — Patient Instructions (Signed)
We are arranging a colonoscopy for you in the near future!  Continue Linzess daily as you are doing.  We will see you in follow-up thereafter!  I enjoyed seeing you again today! As you know, I value our relationship and want to provide genuine, compassionate, and quality care. I welcome your feedback. If you receive a survey regarding your visit,  I greatly appreciate you taking time to fill this out. See you next time!  Annitta Needs, PhD, ANP-BC [facility]

## 2021-12-02 NOTE — Progress Notes (Signed)
Gastroenterology Office Note     Primary Care Physician:  Sharilyn Sites, MD  Primary Gastroenterologist: Dr. Gala Romney    Chief Complaint   Chief Complaint  Patient presents with   Abdominal Pain    Had part of colon taken out in 2003. Over due for colonscopy due to knee replacement. Knee replacement on 07/10/21. States she can only drink 32 ounces per day for the colonoscopy prep. Having some pain in left lower side and some back pain all the way across back. Pain started about 5 -6 weeks ago.      History of Present Illness   [name] is a 61 y.o. female presenting today in follow-up with a history of chronic constipation, complicated diverticulitis in remote past requiring surgery, last colonoscopy 2018 and due for 5-year-surveillance in light of personal history of polyps  CT completed recently without diverticulitis. She did have moderate-sized hiatal hernia and moderate sized abominal wall hernia on right side of abdomen containing part of transverse colon.   Dr. Nelva Bush in [place] next month. Pain across lower back. Radiates around LLQ. Constant. Sitting and resting relieves somewhat. Worsened with movement. Small improvement after BM.   Daily BM with 72 mcg Linzess. Doing well with this.       Past Medical History:  Diagnosis Date   Adenomatous polyp 2009   Anxiety    Arthritis    Bipolar 1 disorder ([place])    Constipation    Depression    Diabetes mellitus    Diabetes mellitus, type II ([place])    Diverticula of colon 2009   Generalized headaches    GERD (gastroesophageal reflux disease)    History of hiatal hernia    small   Hypertension    no meds   Migraine    Neuropathy    both feet   Obesity    Pelvic floor dysfunction    abnormal anorectal manometry at [facility] in 2009   Plantar fasciitis both feet   Sleep apnea    cpap setting of 3.5    Past Surgical History:  Procedure Laterality Date   ABDOMINAL HYSTERECTOMY     compete   CATARACT  EXTRACTION W/PHACO Right 02/15/2017   Procedure: CATARACT EXTRACTION PHACO AND INTRAOCULAR LENS PLACEMENT ([place]);  Surgeon: Rutherford Guys, MD;  Location: [facility];  Service: Ophthalmology;  Laterality: Right;  CDE: 4.18   CATARACT EXTRACTION W/PHACO Left 03/01/2017   Procedure: CATARACT EXTRACTION PHACO AND INTRAOCULAR LENS PLACEMENT ([facility]);  Surgeon: Rutherford Guys, MD;  Location: [facility];  Service: Ophthalmology;  Laterality: Left;  CDE: 2.56   COLON RESECTION  03/30/2002   with end-colostomy and Hartmann's pouch   COLON SURGERY     complicated diverticulitis requiring sigmoid resection with colostomy and subsequent takedown   COLONOSCOPY  12/11/2007   Dr. Gala Romney- marginal prep, normal rectum pancolonic diverticula, adenomatous polyp   COLONOSCOPY  08/28/2003    Wide open colonic anastomosis/Scattered diverticula noted throughout colon/ Small external hemorrhoids   COLONOSCOPY  04/2012   [facility]: hyperplastic polyps, diverticulosis, ileocolonic anastomosis.   COLONOSCOPY WITH PROPOFOL N/A 06/07/2016   Sigmoid diverticulosis, s/p prior segmental resection. 5 year surveillance. Personal history of polyps in past.   COLOSTOMY CLOSURE  07/10/2002   ESOPHAGOGASTRODUODENOSCOPY N/A 01/08/2020   Procedure: UPPER GASTROINTESTINAL ENDOSCOPY;  Surgeon: Alphonsa Overall, MD;  [facility]: [facility];  Service: General;  Laterality: N/A;   ESOPHAGOGASTRODUODENOSCOPY (EGD) WITH PROPOFOL N/A 08/23/2016   Procedure: ESOPHAGOGASTRODUODENOSCOPY (EGD) WITH PROPOFOL;  Surgeon: Alphonsa Overall, MD;  [facility];  Service: General;  Laterality: N/A;   FLEXIBLE SIGMOIDOSCOPY  01/20/2012   RMR: incomplete/attempted colonoscopy. Inadequate prep precluded examination   HEEL SPUR SURGERY Left 09/11/2013   both have been done   KNEE ARTHROSCOPY  02/04/2004    left knee/partial medial meniscectomy.   KNEE SURGERY     3 arthroscopic  2 on left 1 on right   LAPAROSCOPIC GASTRIC BANDING  2009   LAPAROSCOPIC GASTRIC SLEEVE  RESECTION N/A 01/08/2020   Procedure: LAPAROSCOPIC SLEEVE GASTRECTOMY;  Surgeon: Alphonsa Overall, MD;  [facility];  Service: General;  Laterality: N/A;   LAPAROSCOPIC SALPINGOOPHERECTOMY  03/30/2002   TOTAL KNEE ARTHROPLASTY Left 03/27/2021   Procedure: TOTAL KNEE ARTHROPLASTY;  Surgeon: Netta Cedars, MD;  [facility];  Service: Orthopedics;  Laterality: Left;   TOTAL KNEE ARTHROPLASTY Right 07/10/2021   Procedure: TOTAL KNEE ARTHROPLASTY;  Surgeon: Netta Cedars, MD;  [facility];  Service: Orthopedics;  Laterality: Right;   TUBAL LIGATION      Current Outpatient Medications  Medication Sig Dispense Refill   ALPRAZolam (XANAX) 1 MG tablet Take 1 tablet (1 mg total) by mouth 3 (three) times daily as needed for anxiety. 270 tablet 1   ARIPiprazole (ABILIFY) 10 MG tablet Take 1 tablet (10 mg total) by mouth at bedtime. 90 tablet 2   ascorbic acid (VITAMIN C) 500 MG tablet Take 500 mg by mouth daily.     Blood Glucose Monitoring Suppl (FREESTYLE LITE) DEVI Use to measure glucose 4 times a day 1 each 0   CALCIUM-VITAMIN D-VITAMIN K PO Take 1 tablet by mouth daily. Chewable     Continuous Blood Gluc Receiver (FREESTYLE LIBRE 2 READER) DEVI As directed 1 each 0   Continuous Blood Gluc Sensor (FREESTYLE LIBRE 2 SENSOR) MISC 1 Piece by Does not apply route every 14 (fourteen) days. 2 each 3   cycloSPORINE (RESTASIS) 0.05 % ophthalmic emulsion Place 2 drops into both eyes 2 (two) times daily.      diclofenac (VOLTAREN) 75 MG EC tablet Take 75 mg by mouth 2 (two) times daily.     Dulaglutide 3 MG/0.5ML SOPN Inject 3 mg into the skin once a week. 2 mL 2   escitalopram (LEXAPRO) 20 MG tablet Take 1 tablet (20 mg total) by mouth daily. 90 tablet 2   fluticasone (FLONASE) 50 MCG/ACT nasal spray Place 2 sprays into the nose daily as needed for allergies.      gabapentin (NEURONTIN) 100 MG capsule Take 1 capsule (100 mg total) by mouth 2 (two) times daily. 180 capsule 0   glucose blood test  strip Test 4 times a day, she has freestyle lite meter 150 each 2   Insulin Pen Needle (B-D ULTRAFINE III SHORT PEN) 31G X 8 MM MISC 1 each by Does not apply route as directed. 100 each 3   linaclotide (LINZESS) 72 MCG capsule Take 1 capsule (72 mcg total) by mouth daily before breakfast. 90 capsule 3   loratadine (CLARITIN) 10 MG tablet Take 10 mg by mouth daily.     meclizine (ANTIVERT) 25 MG tablet Take 25 mg by mouth 2 (two) times daily as needed for dizziness.   2   Menthol, Topical Analgesic, (ICY HOT EX) Apply 1 application topically 4 (four) times daily as needed (joint pain.).     methocarbamol (ROBAXIN) 500 MG tablet Take 1 tablet (500 mg total) by mouth every 6 (six) hours as needed for muscle spasms. 60 tablet 1  Multiple Vitamin (MULTIVITAMIN WITH MINERALS) TABS tablet Take 1 tablet by mouth daily. celebrate multivitamin 2 in 1     pantoprazole (PROTONIX) 40 MG tablet Take 1 tablet (40 mg total) by mouth 2 (two) times daily. (Patient taking differently: Take 40 mg by mouth daily before breakfast.) 180 tablet 3   Probiotic Product (PROBIOTIC PO) Take 1 tablet by mouth daily.     Propylene Glycol-Glycerin 0.6-0.6 % SOLN Place 1 drop into both eyes 2 (two) times daily.     rizatriptan (MAXALT-MLT) 10 MG disintegrating tablet Take 10 mg by mouth as needed for migraine. May repeat in 2 hours if needed     temazepam (RESTORIL) 30 MG capsule Take 1 capsule (30 mg total) by mouth at bedtime as needed for sleep. 90 capsule 2   No current facility-administered medications for this visit.    Allergies as of 12/02/2021 - Review Complete 12/02/2021  Allergen Reaction Noted   Bactrim Itching, Nausea And Vomiting, and Rash 01/09/2011    Family History  Problem Relation Age of Onset   Ovarian cancer Mother    Heart disease Mother    Anxiety disorder Mother    Cirrhosis Father        deceased age 36   Alcohol abuse Father    Liver cancer Cousin        age 68, deceased   Drug abuse Cousin     ADD / ADHD Son    Anxiety disorder Sister    Dementia Maternal Grandfather    Colon cancer Other        aunt, deceased age 76   Breast cancer Other        aunt, deceased age 56   Bipolar disorder Neg Hx    Depression Neg Hx    OCD Neg Hx    Paranoid behavior Neg Hx    Schizophrenia Neg Hx    Seizures Neg Hx    Sexual abuse Neg Hx    Physical abuse Neg Hx     Social History   Socioeconomic History   Marital status: Married    Spouse name: Not on file   Number of children: 2   Years of education: college   Highest education level: Not on file  Occupational History   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    Employer: DISABLED  Tobacco Use   Smoking status: Never    Passive exposure: Never   Smokeless tobacco: Never  Vaping Use   Vaping Use: Never used  Substance and Sexual Activity   Alcohol use: Not Currently   Drug use: Never   Sexual activity: Not Currently    Birth control/protection: Surgical  Other Topics Concern   Not on file  Social History Narrative   Not on file   Social Determinants of Health   Financial Resource Strain: Not on file  Food Insecurity: Not on file  Transportation [name]: Not on file  Physical Activity: Not on file  Stress: Not on file  Social Connections: Not on file  Intimate Partner Violence: Not on file     Review of Systems   Gen: Denies any fever, chills, fatigue, weight loss, lack of appetite.  CV: Denies chest pain, heart palpitations, peripheral edema, syncope.  Resp: Denies shortness of breath at rest or with exertion. Denies wheezing or cough.  GI: see HPI GU : Denies urinary burning, urinary frequency, urinary hesitancy MS: Denies joint pain, muscle weakness, cramps, or limitation of movement.  Derm:  Denies rash, itching, dry skin Psych: Denies depression, anxiety, memory loss, and confusion Heme: Denies bruising, bleeding, and enlarged lymph nodes.   Physical Exam   BP (!) 143/91 (BP Location:  Left Arm, Patient Position: Sitting, Cuff Size: Large)   Pulse 92   Temp 98.1 F (36.7 C) (Oral)   Ht '5\' 7"'$  (1.702 m)   Wt 250 lb 9.6 oz (113.7 kg)   BMI 39.25 kg/m  General:   Alert and oriented. Pleasant and cooperative. Well-nourished and well-developed.  Head:  Normocephalic and atraumatic. Eyes:  Without icterus Abdomen:  +BS, soft, non-tender and non-distended. No HSM noted. No guarding or rebound. No masses appreciated.  Rectal:  Deferred  Msk:  Symmetrical without gross deformities. Normal posture. Extremities:  Without edema. Neurologic:  Alert and  oriented x4;  grossly normal neurologically. Skin:  Intact without significant lesions or rashes. Psych:  Alert and cooperative. Normal mood and affect.   Assessment   DEETYA DROUILLARD is a 61 y.o. female presenting today in follow-up with a history of  chronic constipation, complicated diverticulitis in remote past requiring surgery, last colonoscopy 2018 and due for 5-year-surveillance in light of personal history of polyps.  Notes lower back pain and radiates around LLQ. CT on file recently without diverticulitis. Suspect abdominal pain related to constipation and likely has musculoskeletal component as well.   Will continue Linzess 72 mcg daily and arrange colonoscopy in near future due to history of polyps.      PLAN   Proceed with colonoscopy by Dr. Gala Romney in near future: the risks, benefits, and alternatives have been discussed with the patient in detail. The patient states understanding and desires to proceed. Linzess 72 mcg daily Follow-up thereafter   Samantha Needs, PhD, ANP-BC [facility]astro[facility]

## 2021-12-02 NOTE — H&P (View-Only) (Signed)
Gastroenterology Office Note     Primary Care Physician:  Sharilyn Sites, MD  Primary Gastroenterologist: Dr. Gala Romney    Chief Complaint   Chief Complaint  Patient presents with   Abdominal Pain    Had part of colon taken out in 2003. Over due for colonscopy due to knee replacement. Knee replacement on 07/10/21. States she can only drink 32 ounces per day for the colonoscopy prep. Having some pain in left lower side and some back pain all the way across back. Pain started about 5 -6 weeks ago.      History of Present Illness   [name] is a 61 y.o. female presenting today in follow-up with a history of chronic constipation, complicated diverticulitis in remote past requiring surgery, last colonoscopy 2018 and due for 5-year-surveillance in light of personal history of polyps  CT completed recently without diverticulitis. She did have moderate-sized hiatal hernia and moderate sized abominal wall hernia on right side of abdomen containing part of transverse colon.   Dr. Nelva Bush in [place] next month. Pain across lower back. Radiates around LLQ. Constant. Sitting and resting relieves somewhat. Worsened with movement. Small improvement after BM.   Daily BM with 72 mcg Linzess. Doing well with this.       Past Medical History:  Diagnosis Date   Adenomatous polyp 2009   Anxiety    Arthritis    Bipolar 1 disorder ([place])    Constipation    Depression    Diabetes mellitus    Diabetes mellitus, type II ([place])    Diverticula of colon 2009   Generalized headaches    GERD (gastroesophageal reflux disease)    History of hiatal hernia    small   Hypertension    no meds   Migraine    Neuropathy    both feet   Obesity    Pelvic floor dysfunction    abnormal anorectal manometry at [facility] in 2009   Plantar fasciitis both feet   Sleep apnea    cpap setting of 3.5    Past Surgical History:  Procedure Laterality Date   ABDOMINAL HYSTERECTOMY     compete   CATARACT  EXTRACTION W/PHACO Right 02/15/2017   Procedure: CATARACT EXTRACTION PHACO AND INTRAOCULAR LENS PLACEMENT ([place]);  Surgeon: Rutherford Guys, MD;  Location: [facility];  Service: Ophthalmology;  Laterality: Right;  CDE: 4.18   CATARACT EXTRACTION W/PHACO Left 03/01/2017   Procedure: CATARACT EXTRACTION PHACO AND INTRAOCULAR LENS PLACEMENT ([facility]);  Surgeon: Rutherford Guys, MD;  Location: [facility];  Service: Ophthalmology;  Laterality: Left;  CDE: 2.56   COLON RESECTION  03/30/2002   with end-colostomy and Hartmann's pouch   COLON SURGERY     complicated diverticulitis requiring sigmoid resection with colostomy and subsequent takedown   COLONOSCOPY  12/11/2007   Dr. Gala Romney- marginal prep, normal rectum pancolonic diverticula, adenomatous polyp   COLONOSCOPY  08/28/2003    Wide open colonic anastomosis/Scattered diverticula noted throughout colon/ Small external hemorrhoids   COLONOSCOPY  04/2012   [facility]: hyperplastic polyps, diverticulosis, ileocolonic anastomosis.   COLONOSCOPY WITH PROPOFOL N/A 06/07/2016   Sigmoid diverticulosis, s/p prior segmental resection. 5 year surveillance. Personal history of polyps in past.   COLOSTOMY CLOSURE  07/10/2002   ESOPHAGOGASTRODUODENOSCOPY N/A 01/08/2020   Procedure: UPPER GASTROINTESTINAL ENDOSCOPY;  Surgeon: Alphonsa Overall, MD;  [facility];  Service: General;  Laterality: N/A;   ESOPHAGOGASTRODUODENOSCOPY (EGD) WITH PROPOFOL N/A 08/23/2016   Procedure: ESOPHAGOGASTRODUODENOSCOPY (EGD) WITH PROPOFOL;  Surgeon: Alphonsa Overall, MD;  [facility];  Service: General;  Laterality: N/A;   FLEXIBLE SIGMOIDOSCOPY  01/20/2012   RMR: incomplete/attempted colonoscopy. Inadequate prep precluded examination   HEEL SPUR SURGERY Left 09/11/2013   both have been done   KNEE ARTHROSCOPY  02/04/2004    left knee/partial medial meniscectomy.   KNEE SURGERY     3 arthroscopic  2 on left 1 on right   LAPAROSCOPIC GASTRIC BANDING  2009   LAPAROSCOPIC GASTRIC SLEEVE  RESECTION N/A 01/08/2020   Procedure: LAPAROSCOPIC SLEEVE GASTRECTOMY;  Surgeon: Alphonsa Overall, MD;  [facility];  Service: General;  Laterality: N/A;   LAPAROSCOPIC SALPINGOOPHERECTOMY  03/30/2002   TOTAL KNEE ARTHROPLASTY Left 03/27/2021   Procedure: TOTAL KNEE ARTHROPLASTY;  Surgeon: Netta Cedars, MD;  [facility];  Service: Orthopedics;  Laterality: Left;   TOTAL KNEE ARTHROPLASTY Right 07/10/2021   Procedure: TOTAL KNEE ARTHROPLASTY;  Surgeon: Netta Cedars, MD;  [facility];  Service: Orthopedics;  Laterality: Right;   TUBAL LIGATION      Current Outpatient Medications  Medication Sig Dispense Refill   ALPRAZolam (XANAX) 1 MG tablet Take 1 tablet (1 mg total) by mouth 3 (three) times daily as needed for anxiety. 270 tablet 1   ARIPiprazole (ABILIFY) 10 MG tablet Take 1 tablet (10 mg total) by mouth at bedtime. 90 tablet 2   ascorbic acid (VITAMIN C) 500 MG tablet Take 500 mg by mouth daily.     Blood Glucose Monitoring Suppl (FREESTYLE LITE) DEVI Use to measure glucose 4 times a day 1 each 0   CALCIUM-VITAMIN D-VITAMIN K PO Take 1 tablet by mouth daily. Chewable     Continuous Blood Gluc Receiver (FREESTYLE LIBRE 2 READER) DEVI As directed 1 each 0   Continuous Blood Gluc Sensor (FREESTYLE LIBRE 2 SENSOR) MISC 1 Piece by Does not apply route every 14 (fourteen) days. 2 each 3   cycloSPORINE (RESTASIS) 0.05 % ophthalmic emulsion Place 2 drops into both eyes 2 (two) times daily.      diclofenac (VOLTAREN) 75 MG EC tablet Take 75 mg by mouth 2 (two) times daily.     Dulaglutide 3 MG/0.5ML SOPN Inject 3 mg into the skin once a week. 2 mL 2   escitalopram (LEXAPRO) 20 MG tablet Take 1 tablet (20 mg total) by mouth daily. 90 tablet 2   fluticasone (FLONASE) 50 MCG/ACT nasal spray Place 2 sprays into the nose daily as needed for allergies.      gabapentin (NEURONTIN) 100 MG capsule Take 1 capsule (100 mg total) by mouth 2 (two) times daily. 180 capsule 0   glucose blood test  strip Test 4 times a day, she has freestyle lite meter 150 each 2   Insulin Pen Needle (B-D ULTRAFINE III SHORT PEN) 31G X 8 MM MISC 1 each by Does not apply route as directed. 100 each 3   linaclotide (LINZESS) 72 MCG capsule Take 1 capsule (72 mcg total) by mouth daily before breakfast. 90 capsule 3   loratadine (CLARITIN) 10 MG tablet Take 10 mg by mouth daily.     meclizine (ANTIVERT) 25 MG tablet Take 25 mg by mouth 2 (two) times daily as needed for dizziness.   2   Menthol, Topical Analgesic, (ICY HOT EX) Apply 1 application topically 4 (four) times daily as needed (joint pain.).     methocarbamol (ROBAXIN) 500 MG tablet Take 1 tablet (500 mg total) by mouth every 6 (six) hours as needed for muscle spasms. 60 tablet 1  Multiple Vitamin (MULTIVITAMIN WITH MINERALS) TABS tablet Take 1 tablet by mouth daily. celebrate multivitamin 2 in 1     pantoprazole (PROTONIX) 40 MG tablet Take 1 tablet (40 mg total) by mouth 2 (two) times daily. (Patient taking differently: Take 40 mg by mouth daily before breakfast.) 180 tablet 3   Probiotic Product (PROBIOTIC PO) Take 1 tablet by mouth daily.     Propylene Glycol-Glycerin 0.6-0.6 % SOLN Place 1 drop into both eyes 2 (two) times daily.     rizatriptan (MAXALT-MLT) 10 MG disintegrating tablet Take 10 mg by mouth as needed for migraine. May repeat in 2 hours if needed     temazepam (RESTORIL) 30 MG capsule Take 1 capsule (30 mg total) by mouth at bedtime as needed for sleep. 90 capsule 2   No current facility-administered medications for this visit.    Allergies as of 12/02/2021 - Review Complete 12/02/2021  Allergen Reaction Noted   Bactrim Itching, Nausea And Vomiting, and Rash 01/09/2011    Family History  Problem Relation Age of Onset   Ovarian cancer Mother    Heart disease Mother    Anxiety disorder Mother    Cirrhosis Father        deceased age 74   Alcohol abuse Father    Liver cancer Cousin        age 79, deceased   Drug abuse Cousin     ADD / ADHD Son    Anxiety disorder Sister    Dementia Maternal Grandfather    Colon cancer Other        aunt, deceased age 48   Breast cancer Other        aunt, deceased age 4   Bipolar disorder Neg Hx    Depression Neg Hx    OCD Neg Hx    Paranoid behavior Neg Hx    Schizophrenia Neg Hx    Seizures Neg Hx    Sexual abuse Neg Hx    Physical abuse Neg Hx     Social History   Socioeconomic History   Marital status: Married    Spouse name: Not on file   Number of children: 2   Years of education: college   Highest education level: Not on file  Occupational History   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    Employer: DISABLED  Tobacco Use   Smoking status: Never    Passive exposure: Never   Smokeless tobacco: Never  Vaping Use   Vaping Use: Never used  Substance and Sexual Activity   Alcohol use: Not Currently   Drug use: Never   Sexual activity: Not Currently    Birth control/protection: Surgical  Other Topics Concern   Not on file  Social History Narrative   Not on file   Social Determinants of Health   Financial Resource Strain: Not on file  Food Insecurity: Not on file  Transportation Needs: Not on file  Physical Activity: Not on file  Stress: Not on file  Social Connections: Not on file  Intimate Partner Violence: Not on file     Review of Systems   Gen: Denies any fever, chills, fatigue, weight loss, lack of appetite.  CV: Denies chest pain, heart palpitations, peripheral edema, syncope.  Resp: Denies shortness of breath at rest or with exertion. Denies wheezing or cough.  GI: see HPI GU : Denies urinary burning, urinary frequency, urinary hesitancy MS: Denies joint pain, muscle weakness, cramps, or limitation of movement.  Derm:  Denies rash, itching, dry skin Psych: Denies depression, anxiety, memory loss, and confusion Heme: Denies bruising, bleeding, and enlarged lymph nodes.   Physical Exam   BP (!) 143/91 (BP Location:  Left Arm, Patient Position: Sitting, Cuff Size: Large)   Pulse 92   Temp 98.1 F (36.7 C) (Oral)   Ht '5\' 7"'$  (1.702 m)   Wt 250 lb 9.6 oz (113.7 kg)   BMI 39.25 kg/m  General:   Alert and oriented. Pleasant and cooperative. Well-nourished and well-developed.  Head:  Normocephalic and atraumatic. Eyes:  Without icterus Abdomen:  +BS, soft, non-tender and non-distended. No HSM noted. No guarding or rebound. No masses appreciated.  Rectal:  Deferred  Msk:  Symmetrical without gross deformities. Normal posture. Extremities:  Without edema. Neurologic:  Alert and  oriented x4;  grossly normal neurologically. Skin:  Intact without significant lesions or rashes. Psych:  Alert and cooperative. Normal mood and affect.   Assessment   [name] is a 61 y.o. female presenting today in follow-up with a history of  chronic constipation, complicated diverticulitis in remote past requiring surgery, last colonoscopy 2018 and due for 5-year-surveillance in light of personal history of polyps.  Notes lower back pain and radiates around LLQ. CT on file recently without diverticulitis. Suspect abdominal pain related to constipation and likely has musculoskeletal component as well.   Will continue Linzess 72 mcg daily and arrange colonoscopy in near future due to history of polyps.      PLAN   Proceed with colonoscopy by Dr. Gala Romney in near future: the risks, benefits, and alternatives have been discussed with the patient in detail. The patient states understanding and desires to proceed. Linzess 72 mcg daily Follow-up thereafter   Annitta Needs, PhD, ANP-BC [facility]astroenter[facility]

## 2021-12-03 ENCOUNTER — Telehealth: Payer: Self-pay | Admitting: *Deleted

## 2021-12-03 ENCOUNTER — Encounter: Payer: Self-pay | Admitting: *Deleted

## 2021-12-03 NOTE — Telephone Encounter (Signed)
PA approved. Auth# [id number], DOS: Dec 23, 2021 - Mar 23, 2022

## 2021-12-09 DIAGNOSIS — M25512 Pain in left shoulder: Secondary | ICD-10-CM | POA: Diagnosis not present

## 2021-12-12 DIAGNOSIS — E1165 Type 2 diabetes mellitus with hyperglycemia: Secondary | ICD-10-CM | POA: Diagnosis not present

## 2021-12-16 NOTE — Patient Instructions (Signed)
[name]  12/16/2021     '@PREFPERIOPPHARMACY'$ @   Your procedure is scheduled on 12/23/2021.  Report to [facility] at 12:45 PM  Call this number if you have problems the morning of surgery:  [phone]   Remember:   Please Follow the diet and prep instructions given to you by Dr. Roseanne Kaufman office.      Take these medicines the morning of surgery with A SIP OF WATER : Xanax, Lexapro, Flonase, Neurontin, Claritin, Antivert, Robaxin and Protonix    Do not wear jewelry, make-up or nail polish.  Do not wear lotions, powders, or perfumes, or deodorant.  Do not shave 48 hours prior to surgery.  Men may shave face and neck.  Do not bring valuables to the hospital.  [facility] is not responsible for any belongings or valuables.  Contacts, dentures or bridgework may not be worn into surgery.  Leave your suitcase in the car.  After surgery it may be brought to your room.  For patients admitted to the hospital, discharge time will be determined by your treatment team.  Patients discharged the day of surgery will not be allowed to drive home.   Name and phone number of your driver:   Family Special instructions:  N/A  Please read over the following fact sheets that you were given. Care and Recovery After Surgery  Colonoscopy, Adult A colonoscopy is a procedure to look at the entire large intestine. This procedure is done using a long, thin, flexible tube that has a camera on the end. You may have a colonoscopy: As a part of normal colorectal screening. If you have certain symptoms, such as: A low number of red blood cells in your blood (anemia). Diarrhea that does not go away. Pain in your abdomen. Blood in your stool. A colonoscopy can help screen for and diagnose medical problems, including: An abnormal growth of cells or tissue (tumor). Abnormal growths within the lining of your intestine (polyps). Inflammation. Areas of bleeding. Tell your health care provider  about: Any allergies you have. All medicines you are taking, including vitamins, herbs, eye drops, creams, and over-the-counter medicines. Any problems you or family members have had with anesthetic medicines. Any bleeding problems you have. Any surgeries you have had. Any medical conditions you have. Any problems you have had with having bowel movements. Whether you are pregnant or may be pregnant. What are the risks? Generally, this is a safe procedure. However, problems may occur, including: Bleeding. Damage to your intestine. Allergic reactions to medicines given during the procedure. Infection. This is rare. What happens before the procedure? Eating and drinking restrictions Follow instructions from your health care provider about eating or drinking restrictions, which may include: A few days before the procedure: Follow a low-fiber diet. Avoid nuts, seeds, dried fruit, raw fruits, and vegetables. 1-3 days before the procedure: Eat only gelatin dessert or ice pops. Drink only clear liquids, such as water, clear juice, clear broth or bouillon, black coffee or tea, or clear soft drinks or sports drinks. Avoid liquids that contain red or purple dye. The day of the procedure: Do not eat solid foods. You may continue to drink clear liquids until up to 2 hours before the procedure. Do not eat or drink anything starting 2 hours before the procedure, or within the time period that your health care provider recommends. Bowel prep If you were prescribed a bowel prep to take by mouth (orally) to clean out your colon: Take it as  told by your health care provider. Starting the day before your procedure, you will need to drink a large amount of liquid medicine. The liquid will cause you to have many bowel movements of loose stool until your stool becomes almost clear or light green. If your skin or the opening between the buttocks (anus) gets irritated from diarrhea, you may relieve the  irritation using: Wipes with medicine in them, such as adult wet wipes with aloe and vitamin E. A product to soothe skin, such as petroleum jelly. If you vomit while drinking the bowel prep: Take a break for up to 60 minutes. Begin the bowel prep again. Call your health care provider if you keep vomiting or you cannot take the bowel prep without vomiting. To clean out your colon, you may also be given: Laxative medicines. These help you have a bowel movement. Instructions for enema use. An enema is liquid medicine injected into your rectum. Medicines Ask your health care provider about: Changing or stopping your regular medicines or supplements. This is especially important if you are taking iron supplements, diabetes medicines, or blood thinners. Taking medicines such as aspirin and ibuprofen. These medicines can thin your blood. Do not take these medicines unless your health care provider tells you to take them. Taking over-the-counter medicines, vitamins, herbs, and supplements. General instructions Ask your health care provider what steps will be taken to help prevent infection. These may include washing skin with a germ-killing soap. If you will be going home right after the procedure, plan to have a responsible adult: Take you home from the hospital or clinic. You will not be allowed to drive. Care for you for the time you are told. What happens during the procedure?  An IV will be inserted into one of your veins. You will be given a medicine to make you fall asleep (general anesthetic). You will lie on your side with your knees bent. A lubricant will be put on the tube. Then the tube will be: Inserted into your anus. Gently eased through all parts of your large intestine. Air will be sent into your colon to keep it open. This may cause some pressure or cramping. Images will be taken with the camera and will appear on a screen. A small tissue sample may be removed to be looked at  under a microscope (biopsy). The tissue may be sent to a lab for testing if any signs of problems are found. If small polyps are found, they may be removed and checked for cancer cells. When the procedure is finished, the tube will be removed. The procedure may vary among health care providers and hospitals. What happens after the procedure? Your blood pressure, heart rate, breathing rate, and blood oxygen level will be monitored until you leave the hospital or clinic. You may have a small amount of blood in your stool. You may pass gas and have mild cramping or bloating in your abdomen. This is caused by the air that was used to open your colon during the exam. If you were given a sedative during the procedure, it can affect you for several hours. Do not drive or operate machinery until your health care provider says that it is safe. It is up to you to get the results of your procedure. Ask your health care provider, or the department that is doing the procedure, when your results will be ready. Summary A colonoscopy is a procedure to look at the entire large intestine. Follow instructions from your  health care provider about eating and drinking before the procedure. If you were prescribed an oral bowel prep to clean out your colon, take it as told by your health care provider. During the colonoscopy, a flexible tube with a camera on its end is inserted into the anus and then passed into all parts of the large intestine. This information is not intended to replace advice given to you by your health care provider. Make sure you discuss any questions you have with your health care provider. Document Revised: 04/20/2021 Document Reviewed: 12/17/2020 Elsevier Patient Education  [place] Anesthesia refers to techniques, procedures, and medicines that help a person stay safe and comfortable during a medical or dental procedure. Monitored anesthesia care, or  sedation, is one type of anesthesia. Your anesthesia specialist may recommend sedation if you will be having a procedure that does not require you to be unconscious. You may have this procedure for: Cataract surgery. A dental procedure. A biopsy. A colonoscopy. During the procedure, you may receive a medicine to help you relax (sedative). There are three levels of sedation: Mild sedation. At this level, you may feel awake and relaxed. You will be able to follow directions. Moderate sedation. At this level, you will be sleepy. You may not remember the procedure. Deep sedation. At this level, you will be asleep. You will not remember the procedure. The more medicine you are given, the deeper your level of sedation will be. Depending on how you respond to the procedure, the anesthesia specialist may change your level of sedation or the type of anesthesia to fit your needs. An anesthesia specialist will monitor you closely during the procedure. Tell a health care provider about: Any allergies you have. All medicines you are taking, including vitamins, herbs, eye drops, creams, and over-the-counter medicines. Any problems you or family members have had with anesthetic medicines. Any blood disorders you have. Any surgeries you have had. Any medical conditions you have, such as sleep apnea. Whether you are pregnant or may be pregnant. Whether you use cigarettes, alcohol, or drugs. Any use of steroids, whether by mouth or as a cream. What are the risks? Generally, this is a safe procedure. However, problems may occur, including: Getting too much medicine (oversedation). Nausea. Allergic reaction to medicines. Trouble breathing. If this happens, a breathing tube may be used to help with breathing. It will be removed when you are awake and breathing on your own. Heart trouble. Lung trouble. Confusion that gets better with time (emergence delirium). What happens before the procedure? Staying  hydrated Follow instructions from your health care provider about hydration, which may include: Up to 2 hours before the procedure - you may continue to drink clear liquids, such as water, clear fruit juice, black coffee, and plain tea. Eating and drinking restrictions Follow instructions from your health care provider about eating and drinking, which may include: 8 hours before the procedure - stop eating heavy meals or foods, such as meat, fried foods, or fatty foods. 6 hours before the procedure - stop eating light meals or foods, such as toast or cereal. 6 hours before the procedure - stop drinking milk or drinks that contain milk. 2 hours before the procedure - stop drinking clear liquids. Medicines Ask your health care provider about: Changing or stopping your regular medicines. This is especially important if you are taking diabetes medicines or blood thinners. Taking medicines such as aspirin and ibuprofen. These medicines can thin your blood.  Do not take these medicines unless your health care provider tells you to take them. Taking over-the-counter medicines, vitamins, herbs, and supplements. Tests and exams You will have a physical exam. You may have blood tests done to show: How well your kidneys and liver are working. How well your blood can clot. General instructions Plan to have a responsible adult take you home from the hospital or clinic. If you will be going home right after the procedure, plan to have a responsible adult care for you for the time you are told. This is important. What happens during the procedure?  Your blood pressure, heart rate, breathing, level of pain, and overall condition will be monitored. An IV will be inserted into one of your veins. You will be given medicines as needed to keep you comfortable during the procedure. This may mean changing the level of sedation. Depending on your age or the procedure, the sedative may be given: As a pill that you  will swallow or as a pill that is inserted into the rectum. As an injection into the vein or muscle. As a spray through the nose. The procedure will be performed. Your breathing, heart rate, and blood pressure will be monitored during the procedure. When the procedure is over, the medicine will be stopped. The procedure may vary among health care providers and hospitals. What happens after the procedure? Your blood pressure, heart rate, breathing rate, and blood oxygen level will be monitored until you leave the hospital or clinic. You may feel sleepy, clumsy, or nauseous. You may feel forgetful about what happened after the procedure. You may vomit. You may continue to get IV fluids. Do not drive or operate machinery until your health care provider says that it is safe. Summary Monitored anesthesia care is used to keep a patient comfortable during short procedures. Tell your health care provider about any allergies or health conditions you have and about all the medicines you are taking. Before the procedure, follow instructions about when to stop eating and drinking and about changing or stopping any medicines. Your blood pressure, heart rate, breathing rate, and blood oxygen level will be monitored until you leave the hospital or clinic. Plan to have a responsible adult take you home from the hospital or clinic. This information is not intended to replace advice given to you by your health care provider. Make sure you discuss any questions you have with your health care provider. Document Revised: 03/31/2021 Document Reviewed: 03/29/2019 Elsevier Patient Education  [place].

## 2021-12-17 DIAGNOSIS — M5416 Radiculopathy, lumbar region: Secondary | ICD-10-CM | POA: Diagnosis not present

## 2021-12-18 ENCOUNTER — Encounter (HOSPITAL_COMMUNITY): Payer: Self-pay

## 2021-12-18 ENCOUNTER — Encounter (HOSPITAL_COMMUNITY)
Admission: RE | Admit: 2021-12-18 | Discharge: 2021-12-18 | Disposition: A | Discharge status: Home / Self Care | Payer: Medicare Other | Source: Ambulatory Visit | Attending: Internal Medicine | Admitting: Internal Medicine

## 2021-12-18 VITALS — BP 155/83 | HR 61 | Temp 98.1°F | Resp 18 | Ht 67.0 in | Wt 250.6 lb

## 2021-12-18 DIAGNOSIS — I10 Essential (primary) hypertension: Secondary | ICD-10-CM | POA: Diagnosis not present

## 2021-12-18 DIAGNOSIS — Z01818 Encounter for other preprocedural examination: Secondary | ICD-10-CM | POA: Insufficient documentation

## 2021-12-18 DIAGNOSIS — E1165 Type 2 diabetes mellitus with hyperglycemia: Secondary | ICD-10-CM | POA: Diagnosis not present

## 2021-12-18 DIAGNOSIS — E119 Type 2 diabetes mellitus without complications: Secondary | ICD-10-CM

## 2021-12-18 LAB — BASIC METABOLIC PANEL
Anion gap: 6 (ref 5–15)
BUN: 24 mg/dL — ABNORMAL HIGH (ref 8–23)
CO2: 29 mmol/L (ref 22–32)
Calcium: 9.2 mg/dL (ref 8.9–10.3)
Chloride: 104 mmol/L (ref 98–111)
Creatinine, Ser: 0.65 mg/dL (ref 0.44–1.00)
GFR, Estimated: >60 mL/min (ref >60)
Glucose, Bld: 122 mg/dL — ABNORMAL HIGH (ref 70–99)
Potassium: 4.5 mmol/L (ref 3.5–5.1)
Sodium: 139 mmol/L (ref 135–145)

## 2021-12-22 ENCOUNTER — Telehealth: Payer: Self-pay | Admitting: *Deleted

## 2021-12-22 NOTE — Telephone Encounter (Signed)
Called pt. She moved procedure time up to 8:30am. Aware of arrival 7am. Discussed prep instructions with her.

## 2021-12-23 ENCOUNTER — Ambulatory Visit (HOSPITAL_BASED_OUTPATIENT_CLINIC_OR_DEPARTMENT_OTHER): Payer: Medicare Other | Admitting: Anesthesiology

## 2021-12-23 ENCOUNTER — Ambulatory Visit (HOSPITAL_COMMUNITY): Payer: Medicare Other | Admitting: Anesthesiology

## 2021-12-23 ENCOUNTER — Ambulatory Visit (HOSPITAL_COMMUNITY)
Admission: RE | Admit: 2021-12-23 | Discharge: 2021-12-23 | Disposition: A | Discharge status: Home / Self Care | Payer: Medicare Other | Attending: Internal Medicine | Admitting: Internal Medicine

## 2021-12-23 ENCOUNTER — Encounter (HOSPITAL_COMMUNITY): Payer: Self-pay | Admitting: Internal Medicine

## 2021-12-23 ENCOUNTER — Encounter (HOSPITAL_COMMUNITY)
Admission: RE | Disposition: A | Discharge status: Home / Self Care | Payer: Self-pay | Source: Home / Self Care | Attending: Internal Medicine

## 2021-12-23 DIAGNOSIS — D123 Benign neoplasm of transverse colon: Secondary | ICD-10-CM | POA: Insufficient documentation

## 2021-12-23 DIAGNOSIS — M1990 Unspecified osteoarthritis, unspecified site: Secondary | ICD-10-CM | POA: Insufficient documentation

## 2021-12-23 DIAGNOSIS — Z86010 Personal history of colonic polyps: Secondary | ICD-10-CM

## 2021-12-23 DIAGNOSIS — Z79899 Other long term (current) drug therapy: Secondary | ICD-10-CM | POA: Insufficient documentation

## 2021-12-23 DIAGNOSIS — K640 First degree hemorrhoids: Secondary | ICD-10-CM

## 2021-12-23 DIAGNOSIS — K5730 Diverticulosis of large intestine without perforation or abscess without bleeding: Secondary | ICD-10-CM

## 2021-12-23 DIAGNOSIS — Z09 Encounter for follow-up examination after completed treatment for conditions other than malignant neoplasm: Secondary | ICD-10-CM | POA: Diagnosis not present

## 2021-12-23 DIAGNOSIS — Z7984 Long term (current) use of oral hypoglycemic drugs: Secondary | ICD-10-CM | POA: Diagnosis not present

## 2021-12-23 DIAGNOSIS — K219 Gastro-esophageal reflux disease without esophagitis: Secondary | ICD-10-CM | POA: Insufficient documentation

## 2021-12-23 DIAGNOSIS — G4730 Sleep apnea, unspecified: Secondary | ICD-10-CM | POA: Diagnosis not present

## 2021-12-23 DIAGNOSIS — F419 Anxiety disorder, unspecified: Secondary | ICD-10-CM | POA: Diagnosis not present

## 2021-12-23 DIAGNOSIS — I10 Essential (primary) hypertension: Secondary | ICD-10-CM | POA: Diagnosis not present

## 2021-12-23 DIAGNOSIS — D124 Benign neoplasm of descending colon: Secondary | ICD-10-CM | POA: Diagnosis not present

## 2021-12-23 DIAGNOSIS — F319 Bipolar disorder, unspecified: Secondary | ICD-10-CM | POA: Diagnosis not present

## 2021-12-23 DIAGNOSIS — K635 Polyp of colon: Secondary | ICD-10-CM

## 2021-12-23 DIAGNOSIS — E1151 Type 2 diabetes mellitus with diabetic peripheral angiopathy without gangrene: Secondary | ICD-10-CM | POA: Insufficient documentation

## 2021-12-23 DIAGNOSIS — Z1211 Encounter for screening for malignant neoplasm of colon: Secondary | ICD-10-CM | POA: Diagnosis not present

## 2021-12-23 DIAGNOSIS — Z794 Long term (current) use of insulin: Secondary | ICD-10-CM | POA: Diagnosis not present

## 2021-12-23 DIAGNOSIS — D126 Benign neoplasm of colon, unspecified: Secondary | ICD-10-CM

## 2021-12-23 HISTORY — PX: COLONOSCOPY WITH PROPOFOL: SHX5780

## 2021-12-23 HISTORY — PX: POLYPECTOMY: SHX5525

## 2021-12-23 LAB — GLUCOSE, CAPILLARY: Glucose-Capillary: 139 mg/dL — ABNORMAL HIGH (ref 70–99)

## 2021-12-23 SURGERY — COLONOSCOPY WITH PROPOFOL
Anesthesia: General

## 2021-12-23 MED ORDER — LIDOCAINE HCL (PF) 2 % IJ SOLN
INTRAMUSCULAR | Status: AC
  Filled 2021-12-23: qty 5

## 2021-12-23 MED ORDER — LIDOCAINE HCL 1 % IJ SOLN
INTRAMUSCULAR | Status: DC | PRN
  Administered 2021-12-23: 50 mg via INTRADERMAL

## 2021-12-23 MED ORDER — PROPOFOL 10 MG/ML IV BOLUS
INTRAVENOUS | Status: DC | PRN
  Administered 2021-12-23: 20 mg via INTRAVENOUS
  Administered 2021-12-23: 80 mg via INTRAVENOUS

## 2021-12-23 MED ORDER — PROPOFOL 500 MG/50ML IV EMUL
INTRAVENOUS | Status: DC | PRN
  Administered 2021-12-23: 150 ug/kg/min via INTRAVENOUS

## 2021-12-23 MED ORDER — PROPOFOL 1000 MG/100ML IV EMUL
INTRAVENOUS | Status: AC
  Filled 2021-12-23: qty 100

## 2021-12-23 MED ORDER — LACTATED RINGERS IV SOLN: INTRAVENOUS | Status: DC

## 2021-12-23 NOTE — Anesthesia Postprocedure Evaluation (Signed)
Anesthesia Post Note  Patient: [name]  Procedure(s) Performed: COLONOSCOPY WITH PROPOFOL POLYPECTOMY  Patient location during evaluation: Short Stay Anesthesia Type: General Level of consciousness: awake and alert Pain management: pain level controlled Vital Signs Assessment: post-procedure vital signs reviewed and stable Respiratory status: spontaneous breathing Cardiovascular status: blood pressure returned to baseline and stable Postop Assessment: no apparent nausea or vomiting Anesthetic complications: no   No notable events documented.   Last Vitals:  Vitals:   12/23/21 0745 12/23/21 0850  BP: (!) 157/94 (!) 115/92  Pulse: 74 75  Resp: 15 14  Temp: 36.6 C 36.6 C  SpO2: 98% 95%    Last Pain:  Vitals:   12/23/21 0850  TempSrc: Oral  PainSc: 0-No pain                 Kjell Brannen

## 2021-12-23 NOTE — Op Note (Signed)
[facility] Patient Name: [name] Procedure Date: 12/23/2021 7:56 AM MRN: [id number] Date of Birth: 05/01/1961 Attending MD: Norvel Richards , MD CSN: [id number] Age: 61 Admit Type: Outpatient Procedure:                Colonoscopy Indications:              High risk colon cancer surveillance: Personal                            history of colonic polyps Providers:                Norvel Richards, MD, Janeece Riggers, RN, Dereck Leep, Technician, Ladoris Gene Technician,                            Technician Referring MD:              Medicines:                Propofol per Anesthesia Complications:            No immediate complications. Estimated Blood Loss:     Estimated blood loss was minimal. Procedure:                Pre-Anesthesia Assessment:                           - Prior to the procedure, a History and Physical                            was performed, and patient medications and                            allergies were reviewed. The patient's tolerance of                            previous anesthesia was also reviewed. The risks                            and benefits of the procedure and the sedation                            options and risks were discussed with the patient.                            All questions were answered, and informed consent                            was obtained. Prior Anticoagulants: The patient has                            taken no previous anticoagulant or antiplatelet                            agents.  ASA Grade Assessment: III - A patient with                            severe systemic disease. After reviewing the risks                            and benefits, the patient was deemed in                            satisfactory condition to undergo the procedure.                           After obtaining informed consent, the colonoscope                            was passed under  direct vision. Throughout the                            procedure, the patient's blood pressure, pulse, and                            oxygen saturations were monitored continuously. The                            [id number]) scope was introduced through the                            anus and advanced to the the cecum, identified by                            appendiceal orifice and ileocecal valve. The                            colonoscopy was performed without difficulty. The                            patient tolerated the procedure well. The quality                            of the bowel preparation was adequate. Scope In: 8:27:21 AM Scope Out: 8:43:21 AM Scope Withdrawal Time: 0 hours 9 minutes 1 second  Total Procedure Duration: 0 hours 16 minutes 0 seconds  Findings:      The perianal and digital rectal examinations were normal.      Scattered medium-mouthed diverticula were found in the entire colon.       Surgical anastomosis at 20 cm.      Two semi-pedunculated polyps were found in the descending colon and       hepatic flexure. The polyps were 5 to 6 mm in size. These polyps were       removed with a cold snare. Resection and retrieval were complete.       Estimated blood loss was minimal.      Non-bleeding internal hemorrhoids were found during retroflexion. The       hemorrhoids were mild, small and Grade I (  internal hemorrhoids that do       not prolapse). Impression:               - Diverticulosis in the entire examined colon.                            Surgical anastomosis at 20 cm.                           - Two 5 to 6 mm polyps in the descending colon and                            at the hepatic flexure, removed with a cold snare.                            Resected and retrieved.                           - Non-bleeding internal hemorrhoids. Moderate Sedation:      Moderate (conscious) sedation was personally administered by an       anesthesia  professional. The following parameters were monitored: oxygen       saturation, heart rate, blood pressure, respiratory rate, EKG, adequacy       of pulmonary ventilation, and response to care. Recommendation:           - Patient has a contact number available for                            emergencies. The signs and symptoms of potential                            delayed complications were discussed with the                            patient. Return to normal activities tomorrow.                            Written discharge instructions were provided to the                            patient.                           - Resume previous diet.                           - Continue present medications.                           - Repeat colonoscopy date to be determined after                            pending pathology results are reviewed for                            surveillance based on pathology results.                           -  Return to GI office (date not yet determined). Procedure Code(s):        --- Professional ---                           [phone], Colonoscopy, flexible; with removal of                            tumor(s), polyp(s), or other lesion(s) by snare                            technique Diagnosis Code(s):        --- Professional ---                           Z86.010, Personal history of colonic polyps                           K64.0, First degree hemorrhoids                           K63.5, Polyp of colon                           K57.30, Diverticulosis of large intestine without                            perforation or abscess without bleeding CPT copyright 2019 American Medical Association. All rights reserved. The codes documented in this report are preliminary and upon coder review may  be revised to meet current compliance requirements. [name]. [name], MD Norvel Richards, MD 12/23/2021 9:03:14 AM This report has been signed electronically. Number of  Addenda: 0

## 2021-12-23 NOTE — Transfer of Care (Signed)
Immediate Anesthesia Transfer of Care Note  Patient: [name]  Procedure(s) Performed: COLONOSCOPY WITH PROPOFOL POLYPECTOMY  Patient Location: Short Stay  Anesthesia Type:General  Level of Consciousness: awake  Airway & Oxygen Therapy: Patient Spontanous Breathing  Post-op Assessment: Report given to RN  Post vital signs: Reviewed and stable  Last Vitals:  Vitals Value Taken Time  BP 115/92 12/23/21 0850  Temp 36.6 C 12/23/21 0850  Pulse 75 12/23/21 0850  Resp 14 12/23/21 0850  SpO2 95 % 12/23/21 0850    Last Pain:  Vitals:   12/23/21 0850  TempSrc: Oral  PainSc: 0-No pain         Complications: No notable events documented.

## 2021-12-23 NOTE — Discharge Instructions (Signed)
  Colonoscopy Discharge Instructions  Read the instructions outlined below and refer to this sheet in the next few weeks. These discharge instructions provide you with general information on caring for yourself after you leave the hospital. Your doctor may also give you specific instructions. While your treatment has been planned according to the most current medical practices available, unavoidable complications occasionally occur. If you have any problems or questions after discharge, call Dr. Gala Romney at [phone]. ACTIVITY You may resume your regular activity, but move at a slower pace for the next 24 hours.  Take frequent rest periods for the next 24 hours.  Walking will help get rid of the air and reduce the bloated feeling in your belly (abdomen).  No driving for 24 hours (because of the medicine (anesthesia) used during the test).   Do not sign any important legal documents or operate any machinery for 24 hours (because of the anesthesia used during the test).  NUTRITION Drink plenty of fluids.  You may resume your normal diet as instructed by your doctor.  Begin with a light meal and progress to your normal diet. Heavy or fried foods are harder to digest and may make you feel sick to your stomach (nauseated).  Avoid alcoholic beverages for 24 hours or as instructed.  MEDICATIONS You may resume your normal medications unless your doctor tells you otherwise.  WHAT YOU CAN EXPECT TODAY Some feelings of bloating in the abdomen.  Passage of more gas than usual.  Spotting of blood in your stool or on the toilet paper.  IF YOU HAD POLYPS REMOVED DURING THE COLONOSCOPY: No aspirin products for 7 days or as instructed.  No alcohol for 7 days or as instructed.  Eat a soft diet for the next 24 hours.  FINDING OUT THE RESULTS OF YOUR TEST Not all test results are available during your visit. If your test results are not back during the visit, make an appointment with your caregiver to find out the  results. Do not assume everything is normal if you have not heard from your caregiver or the medical facility. It is important for you to follow up on all of your test results.  SEEK IMMEDIATE MEDICAL ATTENTION IF: You have more than a spotting of blood in your stool.  Your belly is swollen (abdominal distention).  You are nauseated or vomiting.  You have a temperature over 101.  You have abdominal pain or discomfort that is severe or gets worse throughout the day.      2 small polyps removed from your colon today   colon polyp and diverticulosis information provided   further recommendations to follow pending review of pathology report  At patient request, I called Elberta Fortis at [phone] rolled to voicemail.  I left a message.

## 2021-12-23 NOTE — Interval H&P Note (Signed)
History and Physical Interval Note:  12/23/2021 8:09 AM  [name]  has presented today for surgery, with the diagnosis of hx polyps.  The various methods of treatment have been discussed with the patient and family. After consideration of risks, benefits and other options for treatment, the patient has consented to  Procedure(s) with comments: COLONOSCOPY WITH PROPOFOL (N/A) - 2:45pm, asa 3, knows new arrival time per Mindy as a surgical intervention.  The patient's history has been reviewed, patient examined, no change in status, stable for surgery.  I have reviewed the patient's chart and labs.  Questions were answered to the patient's satisfaction.     Crystie Yanko  No change.  Surveillance colonoscopy today per plan. The risks, benefits, limitations, alternatives and imponderables have been reviewed with the patient. Questions have been answered. All parties are agreeable.

## 2021-12-23 NOTE — Anesthesia Preprocedure Evaluation (Signed)
Anesthesia Evaluation  Patient identified by MRN, date of birth, ID band Patient awake    Reviewed: Allergy & Precautions, NPO status , Patient's Chart, lab work & pertinent test results  Airway Mallampati: II  TM Distance: >3 FB Neck ROM: Full    Dental  (+) Dental Advisory Given, Caps   Pulmonary sleep apnea and Continuous Positive Airway Pressure Ventilation ,    Pulmonary exam normal breath sounds clear to auscultation       Cardiovascular hypertension, Pt. on medications Normal cardiovascular exam Rhythm:Regular Rate:Normal     Neuro/Psych  Headaches, PSYCHIATRIC DISORDERS Anxiety Depression Bipolar Disorder Memory loss Neuromuscular disease    GI/Hepatic hiatal hernia, GERD  Medicated and Controlled,(+)     substance abuse (social)  alcohol use,   Endo/Other  diabetes, Well Controlled, Type 2, Oral Hypoglycemic Agents, Insulin Dependent  Renal/GU negative Renal ROS  negative genitourinary   Musculoskeletal  (+) Arthritis , Osteoarthritis,    Abdominal   Peds negative pediatric ROS (+)  Hematology negative hematology ROS (+)   Anesthesia Other Findings   Reproductive/Obstetrics negative OB ROS                            Anesthesia Physical Anesthesia Plan  ASA: 3  Anesthesia Plan: General   Post-op Pain Management: Minimal or no pain anticipated   Induction: Intravenous  PONV Risk Score and Plan: Propofol infusion  Airway Management Planned: Nasal Cannula and Natural Airway  Additional Equipment:   Intra-op Plan:   Post-operative Plan:   Informed Consent: I have reviewed the patients History and Physical, chart, labs and discussed the procedure including the risks, benefits and alternatives for the proposed anesthesia with the patient or authorized representative who has indicated his/her understanding and acceptance.     Dental advisory given  Plan Discussed with:  CRNA and Surgeon  Anesthesia Plan Comments:         Anesthesia Quick Evaluation

## 2021-12-24 ENCOUNTER — Encounter: Payer: Self-pay | Admitting: Internal Medicine

## 2021-12-24 LAB — SURGICAL PATHOLOGY

## 2021-12-28 ENCOUNTER — Encounter (HOSPITAL_COMMUNITY): Payer: Self-pay | Admitting: Internal Medicine

## 2021-12-29 DIAGNOSIS — M79672 Pain in left foot: Secondary | ICD-10-CM | POA: Diagnosis not present

## 2021-12-29 DIAGNOSIS — M79671 Pain in right foot: Secondary | ICD-10-CM | POA: Diagnosis not present

## 2021-12-30 ENCOUNTER — Ambulatory Visit (INDEPENDENT_AMBULATORY_CARE_PROVIDER_SITE_OTHER): Payer: Medicare Other | Admitting: "Endocrinology

## 2021-12-30 ENCOUNTER — Encounter: Payer: Self-pay | Admitting: "Endocrinology

## 2021-12-30 VITALS — BP 136/82 | HR 76 | Ht 67.0 in | Wt 248.2 lb

## 2021-12-30 DIAGNOSIS — E559 Vitamin D deficiency, unspecified: Secondary | ICD-10-CM

## 2021-12-30 DIAGNOSIS — E1165 Type 2 diabetes mellitus with hyperglycemia: Secondary | ICD-10-CM | POA: Diagnosis not present

## 2021-12-30 DIAGNOSIS — I10 Essential (primary) hypertension: Secondary | ICD-10-CM | POA: Diagnosis not present

## 2021-12-30 DIAGNOSIS — E782 Mixed hyperlipidemia: Secondary | ICD-10-CM | POA: Diagnosis not present

## 2021-12-30 DIAGNOSIS — Z6838 Body mass index (BMI) 38.0-38.9, adult: Secondary | ICD-10-CM

## 2021-12-30 LAB — POCT GLYCOSYLATED HEMOGLOBIN (HGB A1C): HbA1c, POC (controlled diabetic range): 6.1 % (ref 0.0–7.0)

## 2021-12-30 NOTE — Patient Instructions (Signed)

## 2021-12-30 NOTE — Progress Notes (Signed)
12/30/2021, 5:39 PM                                     Endocrinology follow-up note   Subjective:    Patient ID: [name], female    DOB: [birth year].  [name] is being seen in follow up after she was seen in consultation for management of currently uncontrolled symptomatic diabetes requested by  Sharilyn Sites, MD.   Past Medical History:  Diagnosis Date   Adenomatous polyp 2009   Anxiety    Arthritis    Bipolar 1 disorder ([place])    Constipation    Depression    Diabetes mellitus    Diabetes mellitus, type II ([place])    Diverticula of colon 2009   Generalized headaches    GERD (gastroesophageal reflux disease)    History of hiatal hernia    small   Hypertension    no meds   Migraine    Neuropathy    both feet   Obesity    Pelvic floor dysfunction    abnormal anorectal manometry at [facility] in 2009   Plantar fasciitis both feet   Sleep apnea    cpap setting of 3.5    Past Surgical History:  Procedure Laterality Date   ABDOMINAL HYSTERECTOMY     compete   CATARACT EXTRACTION W/PHACO Right 02/15/2017   Procedure: CATARACT EXTRACTION PHACO AND INTRAOCULAR LENS PLACEMENT ([place]);  Surgeon: Rutherford Guys, MD;  Location: [facility];  Service: Ophthalmology;  Laterality: Right;  CDE: 4.18   CATARACT EXTRACTION W/PHACO Left 03/01/2017   Procedure: CATARACT EXTRACTION PHACO AND INTRAOCULAR LENS PLACEMENT ([facility]);  Surgeon: Rutherford Guys, MD;  Location: [facility];  Service: Ophthalmology;  Laterality: Left;  CDE: 2.56   COLON RESECTION  03/30/2002   with end-colostomy and Hartmann's pouch   COLON SURGERY     complicated diverticulitis requiring sigmoid resection with colostomy and subsequent takedown   COLONOSCOPY  12/11/2007   Dr. Gala Romney- marginal prep, normal rectum pancolonic diverticula, adenomatous polyp   COLONOSCOPY  08/28/2003    Wide open colonic anastomosis/Scattered diverticula noted throughout colon/ Small external hemorrhoids    COLONOSCOPY  04/2012   [facility]: hyperplastic polyps, diverticulosis, ileocolonic anastomosis.   COLONOSCOPY WITH PROPOFOL N/A 06/07/2016   Sigmoid diverticulosis, s/p prior segmental resection. 5 year surveillance. Personal history of polyps in past.   COLONOSCOPY WITH PROPOFOL N/A 12/23/2021   Procedure: COLONOSCOPY WITH PROPOFOL;  Surgeon: Daneil Dolin, MD;  Location[facility];  Service: Endoscopy;  Laterality: N/A;  2:45pm, asa 3, knows new arrival time per [place]  07/10/2002   ESOPHAGOGASTRODUODENOSCOPY N/A 01/08/2020   Procedure: UPPER GASTROINTESTINAL ENDOSCOPY;  Surgeon: Alphonsa Overall, MD;  Location: [facility];  Service: General;  Laterality: N/A;   ESOPHAGOGASTRODUODENOSCOPY (EGD) WITH PROPOFOL N/A 08/23/2016   Procedure: ESOPHAGOGASTRODUODENOSCOPY (EGD) WITH PROPOFOL;  Surgeon: Alphonsa Overall, MD;  Location: [facility];  Service: General;  Laterality: N/A;   FLEXIBLE SIGMOIDOSCOPY  01/20/2012   RMR: incomplete/attempted colonoscopy. Inadequate prep precluded examination   HEEL SPUR SURGERY Left 09/11/2013   both have been done   KNEE ARTHROSCOPY  02/04/2004    left knee/partial medial meniscectomy.   KNEE SURGERY     3 arthroscopic  2 on left 1 on right   LAPAROSCOPIC GASTRIC BANDING  2009   LAPAROSCOPIC GASTRIC SLEEVE RESECTION N/A 01/08/2020  Procedure: LAPAROSCOPIC SLEEVE GASTRECTOMY;  Surgeon: Alphonsa Overall, MD;  Location: [facility];  Service: General;  Laterality: N/A;   LAPAROSCOPIC SALPINGOOPHERECTOMY  03/30/2002   POLYPECTOMY  12/23/2021   Procedure: POLYPECTOMY;  Surgeon: Daneil Dolin, MD;  [facility];  Service: Endoscopy;;   TOTAL KNEE ARTHROPLASTY Left 03/27/2021   Procedure: TOTAL KNEE ARTHROPLASTY;  Surgeon: Netta Cedars, MD;  [facility];  Service: Orthopedics;  Laterality: Left;   TOTAL KNEE ARTHROPLASTY Right 07/10/2021   Procedure: TOTAL KNEE ARTHROPLASTY;  Surgeon: Netta Cedars, MD;  [facility];  Service: Orthopedics;   Laterality: Right;   TUBAL LIGATION      Social History   Socioeconomic History   Marital status: Married    Spouse name: Not on file   Number of children: 2   Years of education: college   Highest education level: Not on file  Occupational History   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    Employer: DISABLED  Tobacco Use   Smoking status: Never    Passive exposure: Never   Smokeless tobacco: Never  Vaping Use   Vaping Use: Never used  Substance and Sexual Activity   Alcohol use: Not Currently   Drug use: Never   Sexual activity: Not Currently    Birth control/protection: Surgical  Other Topics Concern   Not on file  Social History Narrative   Not on file   Social Determinants of Health   Financial Resource Strain: Not on file  Food Insecurity: Not on file  Transportation Needs: Not on file  Physical Activity: Not on file  Stress: Not on file  Social Connections: Not on file    Family History  Problem Relation Age of Onset   Ovarian cancer Mother    Heart disease Mother    Anxiety disorder Mother    Cirrhosis Father        deceased age 29   Alcohol abuse Father    Liver cancer Cousin        age 21, deceased   Drug abuse Cousin    ADD / ADHD Son    Anxiety disorder Sister    Dementia Maternal Grandfather    Colon cancer Other        aunt, deceased age 23   Breast cancer Other        aunt, deceased age 21   Bipolar disorder Neg Hx    Depression Neg Hx    OCD Neg Hx    Paranoid behavior Neg Hx    Schizophrenia Neg Hx    Seizures Neg Hx    Sexual abuse Neg Hx    Physical abuse Neg Hx     Outpatient Encounter Medications as of 12/30/2021  Medication Sig   ALPRAZolam (XANAX) 1 MG tablet Take 1 tablet (1 mg total) by mouth 3 (three) times daily as needed for anxiety.   amoxicillin (AMOXIL) 500 MG capsule Take 500 mg by mouth See admin instructions. Take 1000 mg by mouth prior 4 hours prior to dental appointment and take 1000 mg after  appointment   ARIPiprazole (ABILIFY) 10 MG tablet Take 1 tablet (10 mg total) by mouth at bedtime.   Ascorbic Acid 500 MG CHEW Take 500 mg by mouth daily.   Blood Glucose Monitoring Suppl (FREESTYLE LITE) DEVI Use to measure glucose 4 times a day   CALCIUM-VITAMIN D-VITAMIN K PO Take 1 tablet by mouth daily. Chewable   Continuous Blood Gluc Receiver (FREESTYLE LIBRE 2  READER) DEVI As directed   Continuous Blood Gluc Sensor (FREESTYLE LIBRE 2 SENSOR) MISC 1 Piece by Does not apply route every 14 (fourteen) days.   cycloSPORINE (RESTASIS) 0.05 % ophthalmic emulsion Place 2 drops into both eyes 2 (two) times daily.    Dulaglutide 3 MG/0.5ML SOPN Inject 3 mg into the skin once a week.   escitalopram (LEXAPRO) 20 MG tablet Take 1 tablet (20 mg total) by mouth daily.   fluticasone (FLONASE) 50 MCG/ACT nasal spray Place 2 sprays into the nose daily as needed for allergies.    gabapentin (NEURONTIN) 100 MG capsule Take 1 capsule (100 mg total) by mouth 2 (two) times daily.   glucose blood test strip Test 4 times a day, she has freestyle lite meter   Insulin Pen Needle (B-D ULTRAFINE III SHORT PEN) 31G X 8 MM MISC 1 each by Does not apply route as directed.   linaclotide (LINZESS) 72 MCG capsule Take 1 capsule (72 mcg total) by mouth daily before breakfast.   loratadine (CLARITIN) 10 MG tablet Take 10 mg by mouth daily.   meclizine (ANTIVERT) 25 MG tablet Take 25 mg by mouth 2 (two) times daily as needed for dizziness.    Menthol, Topical Analgesic, (BENGAY EX) Apply 1 Application topically daily as needed (pain).   methocarbamol (ROBAXIN) 500 MG tablet Take 1 tablet (500 mg total) by mouth every 6 (six) hours as needed for muscle spasms. (Patient not taking: Reported on 12/15/2021)   methocarbamol (ROBAXIN) 750 MG tablet Take 750-1,500 mg by mouth in the morning and at bedtime.   Multiple Vitamin (MULTIVITAMIN WITH MINERALS) TABS tablet Take 1 tablet by mouth daily. celebrate multivitamin 2 in 1    Multiple Vitamins-Minerals (IMMUNE SYSTEM BOOSTER PO) Take 1 tablet by mouth daily.   NON FORMULARY Pt uses a cpap nightly   pantoprazole (PROTONIX) 40 MG tablet Take 1 tablet (40 mg total) by mouth 2 (two) times daily. (Patient taking differently: Take 40 mg by mouth daily before breakfast.)   polyethylene glycol (MIRALAX / GLYCOLAX) 17 g packet Take 17 g by mouth daily.   Probiotic Product (PROBIOTIC PO) Take 1 tablet by mouth daily.   Propylene Glycol-Glycerin 0.6-0.6 % SOLN Place 2 drops into both eyes 2 (two) times daily.   rizatriptan (MAXALT-MLT) 10 MG disintegrating tablet Take 10 mg by mouth as needed for migraine. May repeat in 2 hours if needed   Sod Picosulfate-Mag Ox-Cit Acd (CLENPIQ) 10-3.5-12 MG-GM -GM/175ML SOLN Take 1 kit by mouth as directed.   temazepam (RESTORIL) 30 MG capsule Take 1 capsule (30 mg total) by mouth at bedtime as needed for sleep. (Patient taking differently: Take 30 mg by mouth at bedtime.)   No facility-administered encounter medications on file as of 12/30/2021.    ALLERGIES: Allergies  Allergen Reactions   Bactrim Itching, Nausea And Vomiting and Rash    Bactrim brand name. Redness.    VACCINATION STATUS: Immunization History  Administered Date(s) Administered   Moderna Sars-Covid-2 Vaccination 07/21/2019, 08/22/2019   Pneumococcal Polysaccharide-23 01/09/2020    Diabetes She presents for her follow-up diabetic visit. She has type 2 diabetes mellitus. Onset time: She was diagnosed at approximate age of 54 years. Her disease course has been improving (She underwent sleeve gastrectomy on January 08, 2020, and she has lost 14 pounds since her surgery.). There are no hypoglycemic associated symptoms. Pertinent negatives for hypoglycemia include no confusion, headaches, pallor or seizures. Associated symptoms include fatigue. Pertinent negatives for diabetes include no chest pain,  no polydipsia, no polyphagia and no polyuria. There are no hypoglycemic  complications. Symptoms are improving. Diabetic complications include heart disease. Risk factors for coronary artery disease include dyslipidemia, family history, diabetes mellitus, hypertension, obesity, sedentary lifestyle and post-menopausal. Her weight is decreasing steadily. She is following a generally unhealthy diet. When asked about meal planning, she reported none. She has not had a previous visit with a dietitian. She never participates in exercise. Her home blood glucose trend is decreasing steadily. Her breakfast blood glucose range is generally 130-140 mg/dl. Her lunch blood glucose range is generally 130-140 mg/dl. Her dinner blood glucose range is generally 130-140 mg/dl. Her bedtime blood glucose range is generally 130-140 mg/dl. Her overall blood glucose range is 130-140 mg/dl. Leaver presents with her freestyle libre CGM device.  Her AGP report shows 84% time in range, 14% level 1 hyperglycemia, 2% level 2 hyperglycemia.  She has no hypoglycemia documented or reported.  Her A1c is 6.1%, overall improving.      She achieved 6th pounds of weight loss after sleeve gastrectomy.  This patient underwent bariatric surgery twice-gastric banding in 2009, sleeve gastrectomy in 2021.  ) An ACE inhibitor/angiotensin II receptor blocker is being taken. Eye exam is current.  Hypertension This is a chronic problem. The current episode started more than 1 year ago. The problem is uncontrolled. Pertinent negatives include no chest pain, headaches, palpitations or shortness of breath. Risk factors for coronary artery disease include diabetes mellitus, obesity, sedentary lifestyle and post-menopausal state. Past treatments include ACE inhibitors.    Review of systems: Limited as above. Objective:    BP 136/82   Pulse 76   Ht $R'5\' 7"'iX$  (1.702 m)   Wt 248 lb 3.2 oz (112.6 kg)   BMI 38.87 kg/m   Wt Readings from Last 3 Encounters:  12/30/21 248 lb 3.2 oz (112.6 kg)  12/18/21 250 lb 9.6 oz (113.7 kg)   12/02/21 250 lb 9.6 oz (113.7 kg)     Physical Exam- Limited  Constitutional:  Body mass index is 38.87 kg/m. , not in acute distress, normal state of mind   CMP     Component Value Date/Time   NA 139 12/18/2021 1157   NA 140 06/15/2021 1026   K 4.5 12/18/2021 1157   CL 104 12/18/2021 1157   CO2 29 12/18/2021 1157   GLUCOSE 122 (H) 12/18/2021 1157   BUN 24 (H) 12/18/2021 1157   BUN 16 06/15/2021 1026   CREATININE 0.65 12/18/2021 1157   CREATININE 0.74 03/23/2011 0400   CALCIUM 9.2 12/18/2021 1157   PROT 7.1 06/15/2021 1026   ALBUMIN 4.4 06/15/2021 1026   AST 16 06/15/2021 1026   ALT 12 06/15/2021 1026   ALKPHOS 109 06/15/2021 1026   BILITOT 0.3 06/15/2021 1026   GFRNONAA >60 12/18/2021 1157   GFRAA >60 01/01/2020 1356   Recent Results (from the past 2160 hour(s))  I-STAT creatinine     Status: None   Collection Time: 10/28/21 11:53 AM  Result Value Ref Range   Creatinine, Ser 0.70 0.44 - 1.00 mg/dL  Basic metabolic panel     Status: Abnormal   Collection Time: 12/18/21 11:57 AM  Result Value Ref Range   Sodium 139 135 - 145 mmol/L   Potassium 4.5 3.5 - 5.1 mmol/L   Chloride 104 98 - 111 mmol/L   CO2 29 22 - 32 mmol/L   Glucose, Bld 122 (H) 70 - 99 mg/dL    Comment: Glucose reference range applies only to samples  taken after fasting for at least 8 hours.   BUN 24 (H) 8 - 23 mg/dL   Creatinine, Ser 0.65 0.44 - 1.00 mg/dL   Calcium 9.2 8.9 - 10.3 mg/dL   GFR, Estimated >60 >60 mL/min    Comment: (NOTE) Calculated using the CKD-EPI Creatinine Equation (2021)    Anion gap 6 5 - 15    Comment: Performed at [facility], [address], [place], [place] [address]  Glucose, capillary     Status: Abnormal   Collection Time: 12/23/21  7:13 AM  Result Value Ref Range   Glucose-Capillary 139 (H) 70 - 99 mg/dL    Comment: Glucose reference range applies only to samples taken after fasting for at least 8 hours.  Surgical pathology     Status: None   Collection Time:  12/23/21  8:38 AM  Result Value Ref Range   SURGICAL PATHOLOGY      SURGICAL PATHOLOGY CASE: [id number] PATIENT: [name] Surgical Pathology Report     Clinical History: Hx polyps (crm)     FINAL MICROSCOPIC DIAGNOSIS:  A. COLON, HEPATIC FLEXURE, POLYPECTOMY: -  Tubular adenoma.  B. COLON, DISTAL, POLYPECTOMY: -  Tubular adenoma.  GROSS DESCRIPTION:  Specimen A: In formalin is a 0.5 cm tan polyp, in toto, one block.  Specimen B: In formalin is a 1 cm pink-red polyp, in toto, one block.  SW 12/23/2021   Final Diagnosis performed by Tilford Pillar DO.   Electronically signed 12/24/2021 Technical component performed at [facility], [place] [address] [place], [place] [address].  Professional component performed at [facility], [place] [address] [place], [place] [address].  Immunohistochemistry Technical component (if applicable) was performed at [facility] [address] [place], [place],  [address].   IMMUNOHISTOCHEMISTRY DISCLAIMER (if applicable ): Some of these immunohistochemical stains may have been developed and the performance characteristics determine by [facility]. Some may not have been cleared or approved by the U.S. Food and Drug Administration. The FDA has determined that such clearance or approval is not necessary. This test is used for clinical purposes. It should not be regarded as investigational or for research. This laboratory is certified under the [place] (CLIA-88) as qualified to perform high complexity clinical laboratory testing.  The controls stained appropriately.   HgB A1c     Status: None   Collection Time: 12/30/21  4:02 PM  Result Value Ref Range   Hemoglobin A1C     HbA1c POC (<> result, manual entry)     HbA1c, POC (prediabetic range)     HbA1c, POC (controlled diabetic range) 6.1 0.0 - 7.0 %      Assessment & Plan:   1. Uncontrolled type 2 diabetes mellitus with hyperglycemia ([place])  - [name] has currently uncontrolled symptomatic type 2 DM since  61 years of age.  Deyonna presents with her freestyle libre CGM device.  Her AGP report shows 84% time in range, 14% level 1 hyperglycemia, 2% level 2 hyperglycemia.  She has no hypoglycemia documented or reported.  Her A1c is 6.1%, overall improving.      She achieved 6th pounds of weight loss after sleeve gastrectomy.  This patient underwent bariatric surgery twice-gastric banding in 2009, sleeve gastrectomy in 2021.  - Recent labs reviewed. - I had a long discussion with her about the progressive nature of diabetes and the pathology behind its complications. -her diabetes is complicated by morbid obesity,  sedentary life, hypertension and she remains at a high risk for more acute and chronic complications which include CAD, CVA, CKD, retinopathy, and neuropathy. These are all discussed in detail with her.  - I have counseled her on diet  and weight management  by adopting a carbohydrate restricted/protein rich diet. Patient is encouraged to switch to  unprocessed or minimally processed     complex starch and increased protein intake (animal or plant source), fruits, and vegetables. -  she is advised to stick to a routine mealtimes to eat 3 meals  a day and avoid unnecessary snacks ( to snack only to correct hypoglycemia).   In light of the fact that she underwent bariatric surgery twice,  she is approached for intensive therapeutic lifestyle change.  She would benefit the most from lifestyle medicine.  - she acknowledges that there is a room for improvement in her food and drink choices. - Suggestion is made for her to avoid simple carbohydrates  from her diet including Cakes, Sweet Desserts, Ice Cream, Soda (diet and regular), Sweet Tea, Candies, Chips, Cookies, Store Bought Juices, Alcohol , Artificial Sweeteners,  Coffee  Creamer, and "Sugar-free" Products, Lemonade. This will help patient to have more stable blood glucose profile and potentially avoid unintended weight gain.  The following Lifestyle Medicine recommendations according to [place]  [facility]) were discussed and and offered to patient and she  agrees to start the journey:  A. Whole Foods, Plant-Based Nutrition comprising of fruits and vegetables, plant-based proteins, whole-grain carbohydrates was discussed in detail with the patient.   A list for source of those nutrients were also provided to the patient.  Patient will use only water or unsweetened tea for hydration. B.  The need to stay away from risky substances including alcohol, smoking; obtaining 7 to 9 hours of restorative sleep, at least 150 minutes of moderate intensity exercise weekly, the importance of healthy social connections,  and stress management techniques were discussed. C.  A full color page of  Calorie density of various food groups per pound showing examples of each food groups was provided to the patient.  - I have approached her with the following individualized plan to manage  her diabetes and patient agrees:   Her current A1c is 6.1%.  She will continue to benefit from Trulicity 1.5 mg subcutaneously weekly.     She will be considered for other medications including metformin on subsequent visits if she continues to lose control. - Specific targets for  A1c;  LDL, HDL,  and Triglycerides were discussed with the patient.   2) Blood Pressure /Hypertension: Her blood pressure is controlled to target.   she is advised to continue her current medications including lisinopril 5 mg p.o. daily with breakfast , lisinopril will be increased to  10 mg for next refill.  3) Lipids/Hyperlipidemia:     -Her recent lipid panel showed uncontrolled LDL at 105.  She will be considered for statin therapy after next visit.    4)  Weight/Diet: Her BMI is 42.19-need  some weight after she lost 62 pounds due to sleeve gastrectomy.  See lifestyle medicine recommendations above.  5) Chronic Care/Health Maintenance:  -she  is on ACEI/ARB and  is encouraged to initiate and continue to follow up with Ophthalmology, Dentist,  Podiatrist at least yearly or according to recommendations, and advised to  stay away from smoking. I have recommended yearly flu vaccine and pneumonia vaccine at least every 5 years; moderate intensity  exercise for up to 150 minutes weekly; and  sleep for at least 7 hours a day.  Her screening ABI was normal in December 2022, This study will be repeated in 5 years-December 2026, or sooner if needed.  - she is  advised to maintain close follow up with Sharilyn Sites, MD for primary care needs, as well as her other providers for optimal and coordinated care.     I spent 40 minutes in the care of the patient today including review of labs from [place], Lipids, Thyroid Function, Hematology (current and previous including abstractions from other facilities); face-to-face time discussing  her blood glucose readings/logs, discussing hypoglycemia and hyperglycemia episodes and symptoms, medications doses, her options of short and long term treatment based on the latest standards of care / guidelines;  discussion about incorporating lifestyle medicine;  and documenting the encounter. Risk reduction counseling performed per USPSTF guidelines to reduce  obesity and cardiovascular risk factors.     Please refer to Patient Instructions for Blood Glucose Monitoring and Insulin/Medications Dosing Guide"  in media tab for additional information. Please  also refer to " Patient Self Inventory" in the Media  tab for reviewed elements of pertinent patient history.  [name] participated in the discussions, expressed understanding, and voiced agreement with the above plans.  All questions were answered to her satisfaction. she is encouraged to contact clinic  should she have any questions or concerns prior to her return visit.     Follow up plan: - Return in about 6 months (around 07/02/2022) for F/U with Pre-visit Labs, Meter/CGM/Logs, A1c here.  Glade Lloyd, MD [facility] [address] [place], [place] [address] Phone: [phone]  Fax: [phone]    12/30/2021, 5:39 PM  This note was partially dictated with voice recognition software. Similar sounding words can be transcribed inadequately or may not  be corrected upon review.

## 2022-01-04 ENCOUNTER — Telehealth: Payer: Self-pay | Admitting: Internal Medicine

## 2022-01-04 NOTE — Telephone Encounter (Signed)
Lmom for pt to return call. 

## 2022-01-04 NOTE — Telephone Encounter (Signed)
Pt calling for results. [phone]

## 2022-01-06 NOTE — Telephone Encounter (Signed)
Pt was made aware of colonoscopy results and verbalized understanding.

## 2022-01-12 ENCOUNTER — Other Ambulatory Visit: Payer: Self-pay | Admitting: "Endocrinology

## 2022-01-12 DIAGNOSIS — E1165 Type 2 diabetes mellitus with hyperglycemia: Secondary | ICD-10-CM | POA: Diagnosis not present

## 2022-01-23 DIAGNOSIS — G4730 Sleep apnea, unspecified: Secondary | ICD-10-CM | POA: Diagnosis not present

## 2022-01-23 DIAGNOSIS — G4733 Obstructive sleep apnea (adult) (pediatric): Secondary | ICD-10-CM | POA: Diagnosis not present

## 2022-02-03 DIAGNOSIS — J069 Acute upper respiratory infection, unspecified: Secondary | ICD-10-CM | POA: Diagnosis not present

## 2022-02-11 DIAGNOSIS — M5416 Radiculopathy, lumbar region: Secondary | ICD-10-CM | POA: Diagnosis not present

## 2022-02-12 DIAGNOSIS — E1165 Type 2 diabetes mellitus with hyperglycemia: Secondary | ICD-10-CM | POA: Diagnosis not present

## 2022-02-17 ENCOUNTER — Telehealth (INDEPENDENT_AMBULATORY_CARE_PROVIDER_SITE_OTHER): Payer: Medicare Other | Admitting: Psychiatry

## 2022-02-17 ENCOUNTER — Encounter (HOSPITAL_COMMUNITY): Payer: Self-pay | Admitting: Psychiatry

## 2022-02-17 DIAGNOSIS — F3162 Bipolar disorder, current episode mixed, moderate: Secondary | ICD-10-CM

## 2022-02-17 MED ORDER — ALPRAZOLAM 1 MG PO TABS
1.0000 mg | ORAL_TABLET | Freq: Three times a day (TID) | ORAL | 1 refills | Status: DC | PRN
Start: 2022-02-17 — End: 2022-05-20

## 2022-02-17 MED ORDER — TEMAZEPAM 30 MG PO CAPS
30.0000 mg | ORAL_CAPSULE | Freq: Every day | ORAL | 2 refills | Status: DC
Start: 2022-02-17 — End: 2022-05-20

## 2022-02-17 MED ORDER — ESCITALOPRAM OXALATE 20 MG PO TABS: 20.0000 mg | ORAL_TABLET | Freq: Every day | ORAL | 2 refills | Status: DC

## 2022-02-17 MED ORDER — ARIPIPRAZOLE 10 MG PO TABS: 10.0000 mg | ORAL_TABLET | Freq: Every day | ORAL | 2 refills | Status: DC

## 2022-02-17 NOTE — Progress Notes (Signed)
Virtual Visit via Telephone Note  I connected with [name] on 02/17/22 at  1:00 PM EDT by telephone and verified that I am speaking with the correct person using two identifiers.  Location: Patient: home Provider: office   I discussed the limitations, risks, security and privacy concerns of performing an evaluation and management service by telephone and the availability of in person appointments. I also discussed with the patient that there may be a patient responsible charge related to this service. The patient expressed understanding and agreed to proceed.     I discussed the assessment and treatment plan with the patient. The patient was provided an opportunity to ask questions and all were answered. The patient agreed with the plan and demonstrated an understanding of the instructions.   The patient was advised to call back or seek an in-person evaluation if the symptoms worsen or if the condition fails to improve as anticipated.  I provided 15 minutes of non-face-to-face time during this encounter.   Levonne Spiller, MD  [facility] MD/PA/NP OP Progress Note  02/17/2022 1:25 PM [name]  MRN:  [id number]  Chief Complaint:  Chief Complaint  Patient presents with   Anxiety   Depression   Manic Behavior   Follow-up   HPI:  This patient is a 61 year old married white female who lives with her husband in [place].  She has 2 sons who live outside the home.  The patient returns for follow-up after 3 months regarding her bipolar disorder.  She is still struggling with back pain.  She is still the primary caregiver for her husband who has a lot of mobility issues.  She has tried for a year to get home health approved by his [employer] benefits.  She recently got the shingles shot as well as injections in her back and now she feels like she has the flu.  This might be side effects from all the injections.  Overall however her mood has been stable and she denies significant  depression anxiety.  She is sleeping fairly well with the Restoril.  She denies any manic symptoms or racing thoughts impulsivity severe depression thoughts of self-harm or suicide.  Her anxiety is under good control Visit Diagnosis:    ICD-10-CM   1. Bipolar 1 disorder, mixed, moderate ([place])  F31.62       Past Psychiatric History: Long-term outpatient treatment  Past Medical History:  Past Medical History:  Diagnosis Date   Adenomatous polyp 2009   Anxiety    Arthritis    Bipolar 1 disorder ([place])    Constipation    Depression    Diabetes mellitus    Diabetes mellitus, type II ([place])    Diverticula of colon 2009   Generalized headaches    GERD (gastroesophageal reflux disease)    History of hiatal hernia    small   Hypertension    no meds   Migraine    Neuropathy    both feet   Obesity    Pelvic floor dysfunction    abnormal anorectal manometry at [facility] in 2009   Plantar fasciitis both feet   Sleep apnea    cpap setting of 3.5    Past Surgical History:  Procedure Laterality Date   ABDOMINAL HYSTERECTOMY     compete   CATARACT EXTRACTION W/PHACO Right 02/15/2017   Procedure: CATARACT EXTRACTION PHACO AND INTRAOCULAR LENS PLACEMENT ([place]);  Surgeon: Rutherford Guys, MD;  Location: [facility];  Service: Ophthalmology;  Laterality: Right;  CDE: 4.18  CATARACT EXTRACTION W/PHACO Left 03/01/2017   Procedure: CATARACT EXTRACTION PHACO AND INTRAOCULAR LENS PLACEMENT ([facility]);  Surgeon: Rutherford Guys, MD;  Location: [facility];  Service: Ophthalmology;  Laterality: Left;  CDE: 2.56   COLON RESECTION  03/30/2002   with end-colostomy and Hartmann's pouch   COLON SURGERY     complicated diverticulitis requiring sigmoid resection with colostomy and subsequent takedown   COLONOSCOPY  12/11/2007   Dr. Gala Romney- marginal prep, normal rectum pancolonic diverticula, adenomatous polyp   COLONOSCOPY  08/28/2003    Wide open colonic anastomosis/Scattered diverticula noted throughout colon/ Small external  hemorrhoids   COLONOSCOPY  04/2012   [facility]: hyperplastic polyps, diverticulosis, ileocolonic anastomosis.   COLONOSCOPY WITH PROPOFOL N/A 06/07/2016   Sigmoid diverticulosis, s/p prior segmental resection. 5 year surveillance. Personal history of polyps in past.   COLONOSCOPY WITH PROPOFOL N/A 12/23/2021   Procedure: COLONOSCOPY WITH PROPOFOL;  Surgeon: Daneil Dolin, MD;  Location: [facility];  Service: Endoscopy;  Laterality: N/A;  2:45pm, asa 3, knows new arrival time per [place]  07/10/2002   ESOPHAGOGASTRODUODENOSCOPY N/A 01/08/2020   Procedure: UPPER GASTROINTESTINAL ENDOSCOPY;  Surgeon: Alphonsa Overall, MD;  Location: [facility];  Service: General;  Laterality: N/A;   ESOPHAGOGASTRODUODENOSCOPY (EGD) WITH PROPOFOL N/A 08/23/2016   Procedure: ESOPHAGOGASTRODUODENOSCOPY (EGD) WITH PROPOFOL;  Surgeon: Alphonsa Overall, MD;  Location: [facility];  Service: General;  Laterality: N/A;   FLEXIBLE SIGMOIDOSCOPY  01/20/2012   [facility]: incomplete/attempted colonoscopy. Inadequate prep precluded examination   HEEL SPUR SURGERY Left 09/11/2013   both have been done   KNEE ARTHROSCOPY  02/04/2004    left knee/partial medial meniscectomy.   KNEE SURGERY     3 arthroscopic  2 on left 1 on right   LAPAROSCOPIC GASTRIC BANDING  2009   LAPAROSCOPIC GASTRIC SLEEVE RESECTION N/A 01/08/2020   Procedure: LAPAROSCOPIC SLEEVE GASTRECTOMY;  Surgeon: Alphonsa Overall, MD;  Location[facility];  Service: General;  Laterality: N/A;   LAPAROSCOPIC SALPINGOOPHERECTOMY  03/30/2002   POLYPECTOMY  12/23/2021   Procedure: POLYPECTOMY;  Surgeon: Daneil Dolin, MD;  [facility];  Service: Endoscopy;;   TOTAL KNEE ARTHROPLASTY Left 03/27/2021   Procedure: TOTAL KNEE ARTHROPLASTY;  Surgeon: Netta Cedars, MD;  Location[facility];  Service: Orthopedics;  Laterality: Left;   TOTAL KNEE ARTHROPLASTY Right 07/10/2021   Procedure: TOTAL KNEE ARTHROPLASTY;  Surgeon: Netta Cedars, MD;  Location[facility];   Service: Orthopedics;  Laterality: Right;   TUBAL LIGATION      Family Psychiatric History: See below  Family History:  Family History  Problem Relation Age of Onset   Ovarian cancer Mother    Heart disease Mother    Anxiety disorder Mother    Cirrhosis Father        deceased age 92   Alcohol abuse Father    Liver cancer Cousin        age 46, deceased   Drug abuse Cousin    ADD / ADHD Son    Anxiety disorder Sister    Dementia Maternal Grandfather    Colon cancer Other        aunt, deceased age 10   Breast cancer Other        aunt, deceased age 50   Bipolar disorder Neg Hx    Depression Neg Hx    OCD Neg Hx    Paranoid behavior Neg Hx    Schizophrenia Neg Hx    Seizures Neg Hx    Sexual abuse Neg  Hx    Physical abuse Neg Hx     Social History:  Social History   Socioeconomic History   Marital status: Married    Spouse name: Not on file   Number of children: 2   Years of education: college   Highest education level: Not on file  Occupational History   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    Employer: DISABLED  Tobacco Use   Smoking status: Never    Passive exposure: Never   Smokeless tobacco: Never  Vaping Use   Vaping Use: Never used  Substance and Sexual Activity   Alcohol use: Not Currently   Drug use: Never   Sexual activity: Not Currently    Birth control/protection: Surgical  Other Topics Concern   Not on file  Social History Narrative   Not on file   Social Determinants of Health   Financial Resource Strain: Not on file  Food Insecurity: Not on file  Transportation Needs: Not on file  Physical Activity: Not on file  Stress: Not on file  Social Connections: Not on file    Allergies:  Allergies  Allergen Reactions   Bactrim Itching, Nausea And Vomiting and Rash    Bactrim brand name. Redness.    Metabolic Disorder Labs: Lab Results  Component Value Date   HGBA1C 6.1 12/30/2021   MPG 140 06/29/2021   MPG 139.85  03/17/2021   No results found for: "PROLACTIN" Lab Results  Component Value Date   CHOL 186 06/15/2021   TRIG 157 (H) 06/15/2021   HDL 54 06/15/2021   CHOLHDL 3.4 06/15/2021   LDLCALC 105 (H) 06/15/2021   LDLCALC 101 (H) 08/13/2020   Lab Results  Component Value Date   TSH 1.060 06/15/2021   TSH 1.310 08/13/2020    Therapeutic Level Labs: No results found for: "LITHIUM" No results found for: "VALPROATE" Lab Results  Component Value Date   CBMZ 7.5 09/19/2012   CBMZ 6.6 05/18/2011    Current Medications: Current Outpatient Medications  Medication Sig Dispense Refill   ALPRAZolam (XANAX) 1 MG tablet Take 1 tablet (1 mg total) by mouth 3 (three) times daily as needed for anxiety. 270 tablet 1   amoxicillin (AMOXIL) 500 MG capsule Take 500 mg by mouth See admin instructions. Take 1000 mg by mouth prior 4 hours prior to dental appointment and take 1000 mg after appointment     ARIPiprazole (ABILIFY) 10 MG tablet Take 1 tablet (10 mg total) by mouth at bedtime. 90 tablet 2   Ascorbic Acid 500 MG CHEW Take 500 mg by mouth daily.     Blood Glucose Monitoring Suppl (FREESTYLE LITE) DEVI Use to measure glucose 4 times a day 1 each 0   CALCIUM-VITAMIN D-VITAMIN K PO Take 1 tablet by mouth daily. Chewable     Continuous Blood Gluc Receiver (FREESTYLE LIBRE 2 READER) DEVI As directed 1 each 0   Continuous Blood Gluc Sensor (FREESTYLE LIBRE 2 SENSOR) MISC 1 Piece by Does not apply route every 14 (fourteen) days. 2 each 3   cycloSPORINE (RESTASIS) 0.05 % ophthalmic emulsion Place 2 drops into both eyes 2 (two) times daily.      escitalopram (LEXAPRO) 20 MG tablet Take 1 tablet (20 mg total) by mouth daily. 90 tablet 2   fluticasone (FLONASE) 50 MCG/ACT nasal spray Place 2 sprays into the nose daily as needed for allergies.      gabapentin (NEURONTIN) 100 MG capsule Take 1 capsule (100 mg total) by  mouth 2 (two) times daily. 180 capsule 0   glucose blood test strip Test 4 times a day, she  has freestyle lite meter 150 each 2   Insulin Pen Needle (B-D ULTRAFINE III SHORT PEN) 31G X 8 MM MISC 1 each by Does not apply route as directed. 100 each 3   linaclotide (LINZESS) 72 MCG capsule Take 1 capsule (72 mcg total) by mouth daily before breakfast. 90 capsule 3   loratadine (CLARITIN) 10 MG tablet Take 10 mg by mouth daily.     meclizine (ANTIVERT) 25 MG tablet Take 25 mg by mouth 2 (two) times daily as needed for dizziness.   2   Menthol, Topical Analgesic, (BENGAY EX) Apply 1 Application topically daily as needed (pain).     methocarbamol (ROBAXIN) 500 MG tablet Take 1 tablet (500 mg total) by mouth every 6 (six) hours as needed for muscle spasms. (Patient not taking: Reported on 12/15/2021) 60 tablet 1   methocarbamol (ROBAXIN) 750 MG tablet Take 750-1,500 mg by mouth in the morning and at bedtime.     Multiple Vitamin (MULTIVITAMIN WITH MINERALS) TABS tablet Take 1 tablet by mouth daily. celebrate multivitamin 2 in 1     Multiple Vitamins-Minerals (IMMUNE SYSTEM BOOSTER PO) Take 1 tablet by mouth daily.     NON FORMULARY Pt uses a cpap nightly     pantoprazole (PROTONIX) 40 MG tablet Take 1 tablet (40 mg total) by mouth 2 (two) times daily. (Patient taking differently: Take 40 mg by mouth daily before breakfast.) 180 tablet 3   polyethylene glycol (MIRALAX / GLYCOLAX) 17 g packet Take 17 g by mouth daily.     Probiotic Product (PROBIOTIC PO) Take 1 tablet by mouth daily.     Propylene Glycol-Glycerin 0.6-0.6 % SOLN Place 2 drops into both eyes 2 (two) times daily.     rizatriptan (MAXALT-MLT) 10 MG disintegrating tablet Take 10 mg by mouth as needed for migraine. May repeat in 2 hours if needed     Sod Picosulfate-Mag Ox-Cit Acd (CLENPIQ) 10-3.5-12 MG-GM -GM/175ML SOLN Take 1 kit by mouth as directed. 350 mL 0   temazepam (RESTORIL) 30 MG capsule Take 1 capsule (30 mg total) by mouth at bedtime. 30 capsule 2   TRULICITY 3 MG/0.5ML SOPN INJECT 3 MG UNDER THE SKIN WEEKLY 2 mL 2   No  current facility-administered medications for this visit.     Musculoskeletal: Strength & Muscle Tone: na Gait & Station: na Patient leans: N/A  Psychiatric Specialty Exam: Review of Systems  Musculoskeletal:  Positive for arthralgias, back pain and myalgias.  Neurological:  Positive for headaches.  All other systems reviewed and are negative.   There were no vitals taken for this visit.There is no height or weight on file to calculate BMI.  General Appearance: NA  Eye Contact:  NA  Speech:  Clear and Coherent  Volume:  Normal  Mood:  Euthymic  Affect:  NA  Thought Process:  Goal Directed  Orientation:  Full (Time, Place, and Person)  Thought Content: WDL   Suicidal Thoughts:  No  Homicidal Thoughts:  No  Memory:  Immediate;   Good Recent;   Good Remote;   Good  Judgement:  Good  Insight:  Fair  Psychomotor Activity:  Decreased  Concentration:  Concentration: Good and Attention Span: Good  Recall:  Good  Fund of Knowledge: Good  Language: Good  Akathisia:  No  Handed:  Right  AIMS (if indicated): not done  Assets:  Communication Skills Desire for Improvement Physical Health Resilience Social Support Talents/Skills  ADL's:  Intact  Cognition: WNL  Sleep:  Good   Screenings: PHQ2-9    Flowsheet Row Video Visit from 02/17/2022 in [facility] [facility] Video Visit from 11/20/2021 in [place] [facility] Video Visit from 08/12/2021 in [place] Video Visit from 05/14/2021 in [place] Video Visit from 12/30/2020 in [facility] [facility]  PHQ-2 Total Score 0 0 0 1 0      Flowsheet Row Video Visit from 02/17/2022 in [facility] (Discharged) from 12/23/2021 in [place] [facility] from 12/18/2021 in [place] No Risk No Risk No Risk        Assessment and Plan: This patient is a 61 year old female with a history of bipolar disorder.  She seems to be doing well on her current regimen.  She will continue Lexapro 20 mg daily for depression, Abilify 10 mg nightly for mood stabilization, Xanax 1 mg 3 times daily for anxiety and Restoril 30 mg at bedtime for sleep.  She will return to see me in 3 months  Collaboration of Care: Collaboration of Care: Primary Care Provider AEB notes will be shared with PCP at patient's request  Patient/Guardian was advised Release of Information must be obtained prior to any record release in order to collaborate their care with an outside provider. Patient/Guardian was advised if they have not already done so to contact the registration department to sign all necessary forms in order for [place] to release information regarding their care.   Consent: Patient/Guardian gives verbal consent for treatment and assignment of benefits for services provided during this visit. Patient/Guardian expressed understanding and agreed to proceed.    Levonne Spiller, MD 02/17/2022, 1:25 PM

## 2022-02-18 ENCOUNTER — Other Ambulatory Visit (HOSPITAL_COMMUNITY): Payer: Self-pay | Admitting: Family Medicine

## 2022-02-18 DIAGNOSIS — Z1231 Encounter for screening mammogram for malignant neoplasm of breast: Secondary | ICD-10-CM

## 2022-03-17 ENCOUNTER — Ambulatory Visit (HOSPITAL_COMMUNITY): Payer: Medicare Other

## 2022-03-22 ENCOUNTER — Ambulatory Visit (HOSPITAL_COMMUNITY)
Admission: RE | Admit: 2022-03-22 | Discharge: 2022-03-22 | Disposition: A | Discharge status: Home / Self Care | Payer: Medicare Other | Source: Ambulatory Visit | Attending: Family Medicine | Admitting: Family Medicine

## 2022-03-22 DIAGNOSIS — Z1231 Encounter for screening mammogram for malignant neoplasm of breast: Secondary | ICD-10-CM | POA: Diagnosis not present

## 2022-04-22 DIAGNOSIS — M5416 Radiculopathy, lumbar region: Secondary | ICD-10-CM | POA: Diagnosis not present

## 2022-04-23 DIAGNOSIS — G4733 Obstructive sleep apnea (adult) (pediatric): Secondary | ICD-10-CM | POA: Diagnosis not present

## 2022-04-23 DIAGNOSIS — G4730 Sleep apnea, unspecified: Secondary | ICD-10-CM | POA: Diagnosis not present

## 2022-04-29 DIAGNOSIS — M79672 Pain in left foot: Secondary | ICD-10-CM | POA: Diagnosis not present

## 2022-04-29 DIAGNOSIS — M79671 Pain in right foot: Secondary | ICD-10-CM | POA: Diagnosis not present

## 2022-05-17 ENCOUNTER — Other Ambulatory Visit: Payer: Self-pay | Admitting: "Endocrinology

## 2022-05-17 DIAGNOSIS — E1165 Type 2 diabetes mellitus with hyperglycemia: Secondary | ICD-10-CM

## 2022-05-17 NOTE — Telephone Encounter (Signed)
In pt's last office visit it was listed her taking Trulicity 1.'5mg'$  but in her med list it looks like the '3mg'$  had been prescribed for her. Requesting clarification on dose you would like pt to take.

## 2022-05-20 ENCOUNTER — Telehealth (INDEPENDENT_AMBULATORY_CARE_PROVIDER_SITE_OTHER): Payer: Medicare Other | Admitting: Psychiatry

## 2022-05-20 ENCOUNTER — Encounter (HOSPITAL_COMMUNITY): Payer: Self-pay | Admitting: Psychiatry

## 2022-05-20 DIAGNOSIS — F3162 Bipolar disorder, current episode mixed, moderate: Secondary | ICD-10-CM

## 2022-05-20 MED ORDER — TEMAZEPAM 30 MG PO CAPS: 30.0000 mg | ORAL_CAPSULE | Freq: Every day | ORAL | 2 refills | Status: DC

## 2022-05-20 MED ORDER — ARIPIPRAZOLE 10 MG PO TABS
10.0000 mg | ORAL_TABLET | Freq: Every day | ORAL | 2 refills | Status: DC
Start: 2022-05-20 — End: 2022-07-09

## 2022-05-20 MED ORDER — ALPRAZOLAM 1 MG PO TABS
1.0000 mg | ORAL_TABLET | Freq: Three times a day (TID) | ORAL | 1 refills | Status: DC | PRN
Start: 2022-05-20 — End: 2022-09-08

## 2022-05-20 MED ORDER — ESCITALOPRAM OXALATE 20 MG PO TABS: 20.0000 mg | ORAL_TABLET | Freq: Every day | ORAL | 2 refills | Status: DC

## 2022-05-20 NOTE — Progress Notes (Signed)
Virtual Visit via Video Note  I connected with [name] on 05/20/22 at  1:00 PM EST by a video enabled telemedicine application and verified that I am speaking with the correct person using two identifiers.  Location: Patient: home Provider: office   I discussed the limitations of evaluation and management by telemedicine and the availability of in person appointments. The patient expressed understanding and agreed to proceed.     I discussed the assessment and treatment plan with the patient. The patient was provided an opportunity to ask questions and all were answered. The patient agreed with the plan and demonstrated an understanding of the instructions.   The patient was advised to call back or seek an in-person evaluation if the symptoms worsen or if the condition fails to improve as anticipated.  I provided 15 minutes of non-face-to-face time during this encounter.   Levonne Spiller, MD  [facility] MD/PA/NP OP Progress Note  05/20/2022 1:13 PM [name]  MRN:  [id number]  Chief Complaint:  Chief Complaint  Patient presents with   Anxiety   Depression   Follow-up   HPI:  This patient is a 62 year old married white female who lives with her husband in [place].  She has 2 sons who live outside the home.   The patient returns for follow-up after 3 months.  This is regarding the bipolar disorder.  She states that she is working hard to lose weight.  She is trying to stay busy and active.  She states that her husband who has mobility issues is very demanding and making her life difficult.  However nevertheless she is in a good mood most of the time and denies significant depression anxiety thoughts of self-harm or suicide.  She is sleeping well.  She denies significant manic symptoms such as racing thoughts or irritability. Visit Diagnosis:    ICD-10-CM   1. Bipolar 1 disorder, mixed, moderate ([place])  F31.62       Past Psychiatric History: Long-term outpatient  treatment  Past Medical History:  Past Medical History:  Diagnosis Date   Adenomatous polyp 2009   Anxiety    Arthritis    Bipolar 1 disorder ([place])    Constipation    Depression    Diabetes mellitus    Diabetes mellitus, type II ([place])    Diverticula of colon 2009   Generalized headaches    GERD (gastroesophageal reflux disease)    History of hiatal hernia    small   Hypertension    no meds   Migraine    Neuropathy    both feet   Obesity    Pelvic floor dysfunction    abnormal anorectal [facility]ometry [facility] in 2009   Plantar fasciitis both feet   Sleep apnea    cpap setting of 3.5    Past Surgical History:  Procedure Laterality Date   ABDOMINAL HYSTERECTOMY     compete   CATARACT EXTRACTION W/PHACO Right 02/15/2017   Procedure: CATARACT EXTRACTION PHACO AND INTRAOCULAR LENS PLACEMENT ([place]);  Surgeon: Rutherford Guys, MD;  Location: [facility];  Service: Ophthalmology;  Laterality: Right;  CDE: 4.18   CATARACT EXTRACTION W/PHACO Left 03/01/2017   Procedure: CATARACT EXTRACTION PHACO AND INTRAOCULAR LENS PLACEMENT ([facility]);  Surgeon: Rutherford Guys, MD;  Location: [facility];  Service: Ophthalmology;  Laterality: Left;  CDE: 2.56   COLON RESECTION  03/30/2002   with end-colostomy and Hartmann's pouch   COLON SURGERY     complicated diverticulitis requiring sigmoid resection with colostomy and  subsequent takedown   COLONOSCOPY  12/11/2007   Dr. Gala Romney- marginal prep, normal rectum pancolonic diverticula, adenomatous polyp   COLONOSCOPY  08/28/2003    Wide open colonic anastomosis/Scattered diverticula noted throughout colon/ Small external hemorrhoids   COLONOSCOPY  04/2012   [facility]: hyperplastic polyps, diverticulosis, ileocolonic anastomosis.   COLONOSCOPY WITH PROPOFOL N/A 06/07/2016   Sigmoid diverticulosis, s/p prior segmental resection. 5 year surveillance. Personal history of polyps in past.   COLONOSCOPY WITH PROPOFOL N/A 12/23/2021   Procedure: COLONOSCOPY WITH PROPOFOL;  Surgeon:  Daneil Dolin, MD;  Location: [facility];  Service: Endoscopy;  Laterality: N/A;  2:45pm, asa 3, knows new arrival time per [place]  07/10/2002   ESOPHAGOGASTRODUODENOSCOPY N/A 01/08/2020   Procedure: UPPER GASTROINTESTINAL ENDOSCOPY;  Surgeon: Alphonsa Overall, MD;  Location: [facility];  Service: General;  Laterality: N/A;   ESOPHAGOGASTRODUODENOSCOPY (EGD) WITH PROPOFOL N/A 08/23/2016   Procedure: ESOPHAGOGASTRODUODENOSCOPY (EGD) WITH PROPOFOL;  Surgeon: Alphonsa Overall, MD;  Location: [facility];  Service: General;  Laterality: N/A;   FLEXIBLE SIGMOIDOSCOPY  01/20/2012   [facility]: incomplete/attempted colonoscopy. Inadequate prep precluded examination   HEEL SPUR SURGERY Left 09/11/2013   both have been done   KNEE ARTHROSCOPY  02/04/2004    left knee/partial medial meniscectomy.   KNEE SURGERY     3 arthroscopic  2 on left 1 on right   LAPAROSCOPIC GASTRIC BANDING  2009   LAPAROSCOPIC GASTRIC SLEEVE RESECTION N/A 01/08/2020   Procedure: LAPAROSCOPIC SLEEVE GASTRECTOMY;  Surgeon: Alphonsa Overall, MD;  Location: [facility];  Service: General;  Laterality: N/A;   LAPAROSCOPIC SALPINGOOPHERECTOMY  03/30/2002   POLYPECTOMY  12/23/2021   Procedure: POLYPECTOMY;  Surgeon: Daneil Dolin, MD;  [facility];  Service: Endoscopy;;   TOTAL KNEE ARTHROPLASTY Left 03/27/2021   Procedure: TOTAL KNEE ARTHROPLASTY;  Surgeon: Netta Cedars, MD;  Location[facility];  Service: Orthopedics;  Laterality: Left;   TOTAL KNEE ARTHROPLASTY Right 07/10/2021   Procedure: TOTAL KNEE ARTHROPLASTY;  Surgeon: Netta Cedars, MD;  Location[facility];  Service: Orthopedics;  Laterality: Right;   TUBAL LIGATION      Family Psychiatric History: See below  Family History:  Family History  Problem Relation Age of Onset   Ovarian cancer Mother    Heart disease Mother    Anxiety disorder Mother    Cirrhosis Father        deceased age 8   Alcohol abuse Father    Liver cancer Cousin        age 74,  deceased   Drug abuse Cousin    ADD / ADHD Son    Anxiety disorder Sister    Dementia Maternal Grandfather    Colon cancer Other        aunt, deceased age 38   Breast cancer Other        aunt, deceased age 37   Bipolar disorder Neg Hx    Depression Neg Hx    OCD Neg Hx    Paranoid behavior Neg Hx    Schizophrenia Neg Hx    Seizures Neg Hx    Sexual abuse Neg Hx    Physical abuse Neg Hx     Social History:  Social History   Socioeconomic History   Marital status: Married    Spouse name: Not on file   Number of children: 2   Years of education: college   Highest education level: Not on file  Occupational History   Occupation: Ship broker at [employer]  Employer: UNEMPLOYED    Employer: DISABLED  Tobacco Use   Smoking status: Never    Passive exposure: Never   Smokeless tobacco: Never  Vaping Use   Vaping Use: Never used  Substance and Sexual Activity   Alcohol use: Not Currently   Drug use: Never   Sexual activity: Not Currently    Birth control/protection: Surgical  Other Topics Concern   Not on file  Social History Narrative   Not on file   Social Determinants of Health   Financial Resource Strain: Not on file  Food Insecurity: Not on file  Transportation Needs: Not on file  Physical Activity: Not on file  Stress: Not on file  Social Connections: Not on file    Allergies:  Allergies  Allergen Reactions   Bactrim Itching, Nausea And Vomiting and Rash    Bactrim brand name. Redness.    Metabolic Disorder Labs: Lab Results  Component Value Date   HGBA1C 6.1 12/30/2021   MPG 140 06/29/2021   MPG 139.85 03/17/2021   No results found for: "PROLACTIN" Lab Results  Component Value Date   CHOL 186 06/15/2021   TRIG 157 (H) 06/15/2021   HDL 54 06/15/2021   CHOLHDL 3.4 06/15/2021   LDLCALC 105 (H) 06/15/2021   LDLCALC 101 (H) 08/13/2020   Lab Results  Component Value Date   TSH 1.060 06/15/2021   TSH 1.310 08/13/2020    Therapeutic Level Labs: No  results found for: "LITHIUM" No results found for: "VALPROATE" Lab Results  Component Value Date   CBMZ 7.5 09/19/2012   CBMZ 6.6 05/18/2011    Current Medications: Current Outpatient Medications  Medication Sig Dispense Refill   ALPRAZolam (XANAX) 1 MG tablet Take 1 tablet (1 mg total) by mouth 3 (three) times daily as needed for anxiety. 270 tablet 1   amoxicillin (AMOXIL) 500 MG capsule Take 500 mg by mouth See admin instructions. Take 1000 mg by mouth prior 4 hours prior to dental appointment and take 1000 mg after appointment     ARIPiprazole (ABILIFY) 10 MG tablet Take 1 tablet (10 mg total) by mouth at bedtime. 90 tablet 2   Ascorbic Acid 500 MG CHEW Take 500 mg by mouth daily.     Blood Glucose Monitoring Suppl (FREESTYLE LITE) DEVI Use to measure glucose 4 times a day 1 each 0   CALCIUM-VITAMIN D-VITAMIN K PO Take 1 tablet by mouth daily. Chewable     Continuous Blood Gluc Receiver (FREESTYLE LIBRE 2 READER) DEVI As directed 1 each 0   Continuous Blood Gluc Sensor (FREESTYLE LIBRE 2 SENSOR) MISC 1 Piece by Does not apply route every 14 (fourteen) days. 2 each 3   cycloSPORINE (RESTASIS) 0.05 % ophthalmic emulsion Place 2 drops into both eyes 2 (two) times daily.      Dulaglutide (TRULICITY) 3 CX/4.4YJ SOPN INJECT 3 MG UNDER THE SKIN WEEKLY 2 mL 2   escitalopram (LEXAPRO) 20 MG tablet Take 1 tablet (20 mg total) by mouth daily. 90 tablet 2   fluticasone (FLONASE) 50 MCG/ACT nasal spray Place 2 sprays into the nose daily as needed for allergies.      gabapentin (NEURONTIN) 100 MG capsule Take 1 capsule (100 mg total) by mouth 2 (two) times daily. 180 capsule 0   glucose blood test strip Test 4 times a day, she has freestyle lite meter 150 each 2   Insulin Pen Needle (B-D ULTRAFINE III SHORT PEN) 31G X 8 MM MISC 1 each by Does not apply route  as directed. 100 each 3   linaclotide (LINZESS) 72 MCG capsule Take 1 capsule (72 mcg total) by mouth daily before breakfast. 90 capsule 3    loratadine (CLARITIN) 10 MG tablet Take 10 mg by mouth daily.     meclizine (ANTIVERT) 25 MG tablet Take 25 mg by mouth 2 (two) times daily as needed for dizziness.   2   Menthol, Topical Analgesic, (BENGAY EX) Apply 1 Application topically daily as needed (pain).     methocarbamol (ROBAXIN) 500 MG tablet Take 1 tablet (500 mg total) by mouth every 6 (six) hours as needed for muscle spasms. (Patient not taking: Reported on 12/15/2021) 60 tablet 1   methocarbamol (ROBAXIN) 750 MG tablet Take 750-1,500 mg by mouth in the morning and at bedtime.     Multiple Vitamin (MULTIVITAMIN WITH MINERALS) TABS tablet Take 1 tablet by mouth daily. celebrate multivitamin 2 in 1     Multiple Vitamins-Minerals (IMMUNE SYSTEM BOOSTER PO) Take 1 tablet by mouth daily.     NON FORMULARY Pt uses a cpap nightly     pantoprazole (PROTONIX) 40 MG tablet Take 1 tablet (40 mg total) by mouth 2 (two) times daily. (Patient taking differently: Take 40 mg by mouth daily before breakfast.) 180 tablet 3   polyethylene glycol (MIRALAX / GLYCOLAX) 17 g packet Take 17 g by mouth daily.     Probiotic Product (PROBIOTIC PO) Take 1 tablet by mouth daily.     Propylene Glycol-Glycerin 0.6-0.6 % SOLN Place 2 drops into both eyes 2 (two) times daily.     rizatriptan (MAXALT-MLT) 10 MG disintegrating tablet Take 10 mg by mouth as needed for migraine. May repeat in 2 hours if needed     Sod Picosulfate-Mag Ox-Cit Acd (CLENPIQ) 10-3.5-12 MG-GM -GM/175ML SOLN Take 1 kit by mouth as directed. 350 mL 0   temazepam (RESTORIL) 30 MG capsule Take 1 capsule (30 mg total) by mouth at bedtime. 30 capsule 2   No current facility-administered medications for this visit.     Musculoskeletal: Strength & Muscle Tone: within normal limits Gait & Station: normal Patient leans: N/A  Psychiatric Specialty Exam: Review of Systems  Musculoskeletal:  Positive for back pain.  All other systems reviewed and are negative.   There were no vitals taken for  this visit.There is no height or weight on file to calculate BMI.  General Appearance: Casual, Neat, and Well Groomed  Eye Contact:  Good  Speech:  Clear and Coherent  Volume:  Normal  Mood:  Euthymic  Affect:  Congruent  Thought Process:  Goal Directed  Orientation:  Full (Time, Place, and Person)  Thought Content: WDL   Suicidal Thoughts:  No  Homicidal Thoughts:  No  Memory:  Immediate;   Good Recent;   Good Remote;   Fair  Judgement:  Good  Insight:  Good  Psychomotor Activity:  Normal  Concentration:  Concentration: Good and Attention Span: Good  Recall:  Good  Fund of Knowledge: Good  Language: Good  Akathisia:  No  Handed:  Right  AIMS (if indicated): not done  Assets:  Communication Skills Desire for Improvement Physical Health Resilience Social Support Talents/Skills  ADL's:  Intact  Cognition: WNL  Sleep:  Good   Screenings: PHQ2-9    Flowsheet Row Video Visit from 02/17/2022 in [place] Video Visit from 11/20/2021 in [place] Video Visit from 08/12/2021 in [place] Video Visit from 05/14/2021 in [place]  [facility] Video Visit from 12/30/2020 in [facility]  PHQ-2 Total Score 0 0 0 1 0      Flowsheet Row Video Visit from 02/17/2022 in [place] [facility] (Discharged) from 12/23/2021 in [place] [facility] from 12/18/2021 in [place] No Risk No Risk No Risk        Assessment and Plan: This patient is a 62 year old female with a history of bipolar disorder.  She continues to do well on her current regimen.  She will continue Lexapro 20 mg daily for depression, Abilify 10 mg at bedtime for mood stabilization, Xanax 1 mg 3 times daily for  anxiety and Restoril 30 mg at bedtime for sleep.  She will return to see me in 3 months  Collaboration of Care: Collaboration of Care: Primary Care Provider AEB notes will be shared with PCP at patient's request  Patient/Guardian was advised Release of Information must be obtained prior to any record release in order to collaborate their care with an outside provider. Patient/Guardian was advised if they have not already done so to contact the registration department to sign all necessary forms in order for [place] to release information regarding their care.   Consent: Patient/Guardian gives verbal consent for treatment and assignment of benefits for services provided during this visit. Patient/Guardian expressed understanding and agreed to proceed.    Levonne Spiller, MD 05/20/2022, 1:13 PM

## 2022-05-31 ENCOUNTER — Other Ambulatory Visit: Payer: Self-pay | Admitting: "Endocrinology

## 2022-05-31 DIAGNOSIS — E1165 Type 2 diabetes mellitus with hyperglycemia: Secondary | ICD-10-CM

## 2022-05-31 DIAGNOSIS — E782 Mixed hyperlipidemia: Secondary | ICD-10-CM

## 2022-05-31 DIAGNOSIS — E538 Deficiency of other specified B group vitamins: Secondary | ICD-10-CM

## 2022-05-31 DIAGNOSIS — I10 Essential (primary) hypertension: Secondary | ICD-10-CM

## 2022-05-31 DIAGNOSIS — E559 Vitamin D deficiency, unspecified: Secondary | ICD-10-CM

## 2022-06-14 ENCOUNTER — Telehealth: Payer: Self-pay

## 2022-06-14 NOTE — Telephone Encounter (Signed)
Pt called stating her pharmacy has not been able to get trulicity. States she has not had a dose in 2 weeks. States she experienced a "fainting spell" Saturday, her BG was 99 at the time she regained consciousness. States she has metformin '1000mg'$  at home and has been taking it once a day since she has not been able to get her trulicity.

## 2022-06-15 NOTE — Telephone Encounter (Signed)
Left a message requesting pt return call to the office. ?

## 2022-06-15 NOTE — Telephone Encounter (Signed)
Discussed with pt, understanding voiced. 

## 2022-06-17 DIAGNOSIS — E119 Type 2 diabetes mellitus without complications: Secondary | ICD-10-CM | POA: Diagnosis not present

## 2022-06-17 LAB — HM DIABETES EYE EXAM

## 2022-06-24 ENCOUNTER — Ambulatory Visit (INDEPENDENT_AMBULATORY_CARE_PROVIDER_SITE_OTHER): Payer: Medicare Other | Admitting: Adult Health

## 2022-06-24 ENCOUNTER — Encounter: Payer: Self-pay | Admitting: Adult Health

## 2022-06-24 VITALS — BP 148/93 | HR 77 | Ht 66.0 in | Wt 263.4 lb

## 2022-06-24 DIAGNOSIS — G4733 Obstructive sleep apnea (adult) (pediatric): Secondary | ICD-10-CM | POA: Diagnosis not present

## 2022-06-24 NOTE — Progress Notes (Signed)
PATIENT: [name] DOB: November 11, 1960  REASON FOR VISIT: follow up HISTORY FROM: patient PRIMARY NEUROLOGIST: Dr. Rexene Alberts  Chief Complaint  Patient presents with   Follow-up    Pt in 23  Pt here for CPAP f/u Pt states not sleeping well at night 2 knee surgeries in the last year      HISTORY OF PRESENT ILLNESS: Today 06/24/22:  [name] is a 62 y.o. female with a history of OSA on CPAP. Returns today for follow-up.  She reports that she has not been sleeping well at night due to 2 knee surgeries.  States that she has been taking more naps throughout the day in her recliner.  Her download is below.    06/23/21: [name] is a 62 year old female with a history of obstructive sleep apnea on CPAP.  She /returns today for follow-up.  Her download indicates that she used her machine 25 out of 30 days/ for compliance of 83%.  She used her machine greater than 4 hours 13 days for compliance of 43%.  On average she uses her machine 4 hours and 22 minutes.  Her residual AHI is 1.9 on 4 cm of water with EPR 3.  The patient states that she is scheduled to have knee replacement for the right knee.  Last year she had knee replacement on the left side.  She states due to ongoing knee pain most the time she gets up out of bed and sleeps in a recliner.  Reports that while she is in the recliner she does not use the CPAP.  REVIEW OF SYSTEMS: Out of a complete 14 system review of symptoms, the patient complains only of the following symptoms, and all other reviewed systems are negative.  ESS 7 [id number]  ALLERGIES: Allergies  Allergen Reactions   Bactrim Itching, Nausea And Vomiting and Rash    Bactrim brand name. Redness.    HOME MEDICATIONS: Outpatient Medications Prior to Visit  Medication Sig Dispense Refill   ALPRAZolam (XANAX) 1 MG tablet Take 1 tablet (1 mg total) by mouth 3 (three) times daily as needed for anxiety. 270 tablet 1   amoxicillin (AMOXIL) 500 MG capsule Take 500  mg by mouth See admin instructions. Take 1000 mg by mouth prior 4 hours prior to dental appointment and take 1000 mg after appointment     ARIPiprazole (ABILIFY) 10 MG tablet Take 1 tablet (10 mg total) by mouth at bedtime. 90 tablet 2   Ascorbic Acid 500 MG CHEW Take 500 mg by mouth daily.     Blood Glucose Monitoring Suppl (FREESTYLE LITE) DEVI Use to measure glucose 4 times a day 1 each 0   CALCIUM-VITAMIN D-VITAMIN K PO Take 1 tablet by mouth daily. Chewable     Continuous Blood Gluc Receiver (FREESTYLE LIBRE 2 READER) DEVI As directed 1 each 0   Continuous Blood Gluc Sensor (FREESTYLE LIBRE 2 SENSOR) MISC 1 Piece by Does not apply route every 14 (fourteen) days. 2 each 3   cycloSPORINE (RESTASIS) 0.05 % ophthalmic emulsion Place 2 drops into both eyes 2 (two) times daily.      Dulaglutide (TRULICITY) 3 0000000 SOPN INJECT 3 MG UNDER THE SKIN WEEKLY 2 mL 2   escitalopram (LEXAPRO) 20 MG tablet Take 1 tablet (20 mg total) by mouth daily. 90 tablet 2   fluticasone (FLONASE) 50 MCG/ACT nasal spray Place 2 sprays into the nose daily as needed for allergies.      gabapentin (NEURONTIN) 100 MG  capsule Take 1 capsule (100 mg total) by mouth 2 (two) times daily. 180 capsule 0   glucose blood test strip Test 4 times a day, she has freestyle lite meter 150 each 2   Insulin Pen Needle (B-D ULTRAFINE III SHORT PEN) 31G X 8 MM MISC 1 each by Does not apply route as directed. 100 each 3   linaclotide (LINZESS) 72 MCG capsule Take 1 capsule (72 mcg total) by mouth daily before breakfast. 90 capsule 3   loratadine (CLARITIN) 10 MG tablet Take 10 mg by mouth daily.     meclizine (ANTIVERT) 25 MG tablet Take 25 mg by mouth 2 (two) times daily as needed for dizziness.   2   Menthol, Topical Analgesic, (BENGAY EX) Apply 1 Application topically daily as needed (pain).     methocarbamol (ROBAXIN) 500 MG tablet Take 1 tablet (500 mg total) by mouth every 6 (six) hours as needed for muscle spasms. (Patient not  taking: Reported on 12/15/2021) 60 tablet 1   methocarbamol (ROBAXIN) 750 MG tablet Take 750-1,500 mg by mouth in the morning and at bedtime.     Multiple Vitamin (MULTIVITAMIN WITH MINERALS) TABS tablet Take 1 tablet by mouth daily. celebrate multivitamin 2 in 1     Multiple Vitamins-Minerals (IMMUNE SYSTEM BOOSTER PO) Take 1 tablet by mouth daily.     NON FORMULARY Pt uses a cpap nightly     pantoprazole (PROTONIX) 40 MG tablet Take 1 tablet (40 mg total) by mouth 2 (two) times daily. (Patient taking differently: Take 40 mg by mouth daily before breakfast.) 180 tablet 3   polyethylene glycol (MIRALAX / GLYCOLAX) 17 g packet Take 17 g by mouth daily.     Probiotic Product (PROBIOTIC PO) Take 1 tablet by mouth daily.     Propylene Glycol-Glycerin 0.6-0.6 % SOLN Place 2 drops into both eyes 2 (two) times daily.     rizatriptan (MAXALT-MLT) 10 MG disintegrating tablet Take 10 mg by mouth as needed for migraine. May repeat in 2 hours if needed     Sod Picosulfate-Mag Ox-Cit Acd (CLENPIQ) 10-3.5-12 MG-GM -GM/175ML SOLN Take 1 kit by mouth as directed. 350 mL 0   temazepam (RESTORIL) 30 MG capsule Take 1 capsule (30 mg total) by mouth at bedtime. 30 capsule 2   No facility-administered medications prior to visit.    PAST MEDICAL HISTORY: Past Medical History:  Diagnosis Date   Adenomatous polyp 2009   Anxiety    Arthritis    Bipolar 1 disorder ([place])    Constipation    Depression    Diabetes mellitus    Diabetes mellitus, type II ([place])    Diverticula of colon 2009   Generalized headaches    GERD (gastroesophageal reflux disease)    History of hiatal hernia    small   Hypertension    no meds   Migraine    Neuropathy    both feet   Obesity    Pelvic floor dysfunction    abnormal anorectal [facility]ometry at [facility] in 2009   Plantar fasciitis both feet   Sleep apnea    cpap setting of 3.5    PAST SURGICAL HISTORY: Past Surgical History:  Procedure Laterality Date   ABDOMINAL HYSTERECTOMY      compete   CATARACT EXTRACTION W/PHACO Right 02/15/2017   Procedure: CATARACT EXTRACTION PHACO AND INTRAOCULAR LENS PLACEMENT ([place]);  Surgeon: Rutherford Guys, MD;  Location: [facility];  Service: Ophthalmology;  Laterality: Right;  CDE: 4.18   CATARACT EXTRACTION  W/PHACO Left 03/01/2017   Procedure: CATARACT EXTRACTION PHACO AND INTRAOCULAR LENS PLACEMENT ([facility]);  Surgeon: Rutherford Guys, MD;  Location: [facility];  Service: Ophthalmology;  Laterality: Left;  CDE: 2.56   COLON RESECTION  03/30/2002   with end-colostomy and Hartmann's pouch   COLON SURGERY     complicated diverticulitis requiring sigmoid resection with colostomy and subsequent takedown   COLONOSCOPY  12/11/2007   Dr. Gala Romney- marginal prep, normal rectum pancolonic diverticula, adenomatous polyp   COLONOSCOPY  08/28/2003    Wide open colonic anastomosis/Scattered diverticula noted throughout colon/ Small external hemorrhoids   COLONOSCOPY  04/2012   [facility]: hyperplastic polyps, diverticulosis, ileocolonic anastomosis.   COLONOSCOPY WITH PROPOFOL N/A 06/07/2016   Sigmoid diverticulosis, s/p prior segmental resection. 5 year surveillance. Personal history of polyps in past.   COLONOSCOPY WITH PROPOFOL N/A 12/23/2021   Procedure: COLONOSCOPY WITH PROPOFOL;  Surgeon: Daneil Dolin, MD;  [facility];  Service: Endoscopy;  Laterality: N/A;  2:45pm, asa 3, knows new arrival time per [place]  07/10/2002   ESOPHAGOGASTRODUODENOSCOPY N/A 01/08/2020   Procedure: UPPER GASTROINTESTINAL ENDOSCOPY;  Surgeon: Alphonsa Overall, MD;  Location: [facility];  Service: General;  Laterality: N/A;   ESOPHAGOGASTRODUODENOSCOPY (EGD) WITH PROPOFOL N/A 08/23/2016   Procedure: ESOPHAGOGASTRODUODENOSCOPY (EGD) WITH PROPOFOL;  Surgeon: Alphonsa Overall, MD;  Location: [facility];  Service: General;  Laterality: N/A;   FLEXIBLE SIGMOIDOSCOPY  01/20/2012   [facility]: incomplete/attempted colonoscopy. Inadequate prep precluded examination   HEEL SPUR  SURGERY Left 09/11/2013   both have been done   KNEE ARTHROSCOPY  02/04/2004    left knee/partial medial meniscectomy.   KNEE SURGERY     3 arthroscopic  2 on left 1 on right   LAPAROSCOPIC GASTRIC BANDING  2009   LAPAROSCOPIC GASTRIC SLEEVE RESECTION N/A 01/08/2020   Procedure: LAPAROSCOPIC SLEEVE GASTRECTOMY;  Surgeon: Alphonsa Overall, MD;  Location: [facility];  Service: General;  Laterality: N/A;   LAPAROSCOPIC SALPINGOOPHERECTOMY  03/30/2002   POLYPECTOMY  12/23/2021   Procedure: POLYPECTOMY;  Surgeon: Daneil Dolin, MD;  [facility];  Service: Endoscopy;;   TOTAL KNEE ARTHROPLASTY Left 03/27/2021   Procedure: TOTAL KNEE ARTHROPLASTY;  Surgeon: Netta Cedars, MD;  Location: [facility];  Service: Orthopedics;  Laterality: Left;   TOTAL KNEE ARTHROPLASTY Right 07/10/2021   Procedure: TOTAL KNEE ARTHROPLASTY;  Surgeon: Netta Cedars, MD;  Location[facility];  Service: Orthopedics;  Laterality: Right;   TUBAL LIGATION      FAMILY HISTORY: Family History  Problem Relation Age of Onset   Ovarian cancer Mother    Heart disease Mother    Anxiety disorder Mother    Cirrhosis Father        deceased age 76   Alcohol abuse Father    Liver cancer Cousin        age 27, deceased   Drug abuse Cousin    ADD / ADHD Son    Anxiety disorder Sister    Dementia Maternal Grandfather    Colon cancer Other        aunt, deceased age 52   Breast cancer Other        aunt, deceased age 24   Bipolar disorder Neg Hx    Depression Neg Hx    OCD Neg Hx    Paranoid behavior Neg Hx    Schizophrenia Neg Hx    Seizures Neg Hx    Sexual abuse Neg Hx    Physical abuse Neg Hx  SOCIAL HISTORY: Social History   Socioeconomic History   Marital status: Married    Spouse name: Not on file   Number of children: 2   Years of education: college   Highest education level: Not on file  Occupational History   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    Employer: DISABLED  Tobacco Use    Smoking status: Never    Passive exposure: Never   Smokeless tobacco: Never  Vaping Use   Vaping Use: Never used  Substance and Sexual Activity   Alcohol use: Not Currently   Drug use: Never   Sexual activity: Not Currently    Birth control/protection: Surgical  Other Topics Concern   Not on file  Social History Narrative   Not on file   Social Determinants of Health   Financial Resource Strain: Not on file  Food Insecurity: Not on file  Transportation Needs: Not on file  Physical Activity: Not on file  Stress: Not on file  Social Connections: Not on file  Intimate Partner Violence: Not on file      PHYSICAL EXAM  Vitals:   06/24/22 1358  BP: (!) 163/97  Pulse: 76  Weight: 263 lb 6.4 oz (119.5 kg)  Height: 5' 6"$  (1.676 m)    Body mass index is 42.51 kg/m.  Generalized: Well developed, in no acute distress  Chest: Lungs clear to auscultation bilaterally  Neurological examination  Mentation: Alert oriented to time, place, history taking. Follows all commands speech and language fluent Cranial nerve II-XII: Extraocular movements were full, visual field were full on confrontational test Head turning and shoulder shrug  were normal and symmetric. Motor: The motor testing reveals 5 over 5 strength of all 4 extremities. Good symmetric motor tone is noted throughout.  Sensory: Sensory testing is intact to soft touch on all 4 extremities. No evidence of extinction is noted.  Gait and station: Has a brace on the right knee   DIAGNOSTIC DATA (LABS, IMAGING, TESTING) - I reviewed patient records, labs, notes, testing and imaging myself where available.  Lab Results  Component Value Date   WBC 7.6 07/12/2021   HGB 11.5 (L) 07/12/2021   HCT 36.1 07/12/2021   MCV 88.5 07/12/2021   PLT 173 07/12/2021      Component Value Date/Time   NA 139 12/18/2021 1157   NA 140 06/15/2021 1026   K 4.5 12/18/2021 1157   CL 104 12/18/2021 1157   CO2 29 12/18/2021 1157    GLUCOSE 122 (H) 12/18/2021 1157   BUN 24 (H) 12/18/2021 1157   BUN 16 06/15/2021 1026   CREATININE 0.65 12/18/2021 1157   CREATININE 0.74 03/23/2011 0400   CALCIUM 9.2 12/18/2021 1157   PROT 7.1 06/15/2021 1026   ALBUMIN 4.4 06/15/2021 1026   AST 16 06/15/2021 1026   ALT 12 06/15/2021 1026   ALKPHOS 109 06/15/2021 1026   BILITOT 0.3 06/15/2021 1026   GFRNONAA >60 12/18/2021 1157   GFRAA >60 01/01/2020 1356   Lab Results  Component Value Date   CHOL 186 06/15/2021   HDL 54 06/15/2021   LDLCALC 105 (H) 06/15/2021   TRIG 157 (H) 06/15/2021   CHOLHDL 3.4 06/15/2021   Lab Results  Component Value Date   HGBA1C 6.1 12/30/2021   Lab Results  Component Value Date   VITAMINB12 571 03/05/2013   Lab Results  Component Value Date   TSH 1.060 06/15/2021      ASSESSMENT AND PLAN 62 y.o. year old  female  has a past medical history of Adenomatous polyp (2009), Anxiety, Arthritis, Bipolar 1 disorder ([place]), Constipation, Depression, Diabetes mellitus, Diabetes mellitus, type II ([place]), Diverticula of colon (2009), Generalized headaches, GERD (gastroesophageal reflux disease), History of hiatal hernia, Hypertension, Migraine, Neuropathy, Obesity, Pelvic floor dysfunction, Plantar fasciitis (both feet), and Sleep apnea. here with:  OSA on CPAP  - CPAP compliance suboptimal - Good treatment of AHI  - Encourage patient to use CPAP nightly and > 4 hours each night - F/U in 1 year or sooner if needed    Ward Givens, MSN, NP-C 06/24/2022, 1:51 PM [facility] [address] [place], [place] [address] [phone]

## 2022-06-25 ENCOUNTER — Other Ambulatory Visit: Payer: Self-pay

## 2022-06-25 DIAGNOSIS — E1165 Type 2 diabetes mellitus with hyperglycemia: Secondary | ICD-10-CM

## 2022-06-25 MED ORDER — TRULICITY 3 MG/0.5ML ~~LOC~~ SOAJ: SUBCUTANEOUS | 2 refills | Status: DC

## 2022-06-28 DIAGNOSIS — E559 Vitamin D deficiency, unspecified: Secondary | ICD-10-CM | POA: Diagnosis not present

## 2022-06-28 DIAGNOSIS — I10 Essential (primary) hypertension: Secondary | ICD-10-CM | POA: Diagnosis not present

## 2022-06-28 DIAGNOSIS — E1165 Type 2 diabetes mellitus with hyperglycemia: Secondary | ICD-10-CM | POA: Diagnosis not present

## 2022-06-28 DIAGNOSIS — E782 Mixed hyperlipidemia: Secondary | ICD-10-CM | POA: Diagnosis not present

## 2022-06-28 DIAGNOSIS — E538 Deficiency of other specified B group vitamins: Secondary | ICD-10-CM | POA: Diagnosis not present

## 2022-06-29 LAB — VITAMIN D 25 HYDROXY (VIT D DEFICIENCY, FRACTURES): Vit D, 25-Hydroxy: 32.6 ng/mL (ref 30.0–100.0)

## 2022-06-29 LAB — COMPREHENSIVE METABOLIC PANEL
ALT: 12 IU/L (ref 0–32)
AST: 18 IU/L (ref 0–40)
Albumin/Globulin Ratio: 1.4 (ref 1.2–2.2)
Albumin: 4.3 g/dL (ref 3.9–4.9)
Alkaline Phosphatase: 98 IU/L (ref 44–121)
BUN/Creatinine Ratio: 29 — ABNORMAL HIGH (ref 12–28)
BUN: 20 mg/dL (ref 8–27)
Bilirubin Total: 0.4 mg/dL (ref 0.0–1.2)
CO2: 24 mmol/L (ref 20–29)
Calcium: 9.7 mg/dL (ref 8.7–10.3)
Chloride: 100 mmol/L (ref 96–106)
Creatinine, Ser: 0.7 mg/dL (ref 0.57–1.00)
Globulin, Total: 3 g/dL (ref 1.5–4.5)
Glucose: 145 mg/dL — ABNORMAL HIGH (ref 70–99)
Potassium: 5.1 mmol/L (ref 3.5–5.2)
Sodium: 141 mmol/L (ref 134–144)
Total Protein: 7.3 g/dL (ref 6.0–8.5)
eGFR: 98 mL/min/{1.73_m2} (ref >59)

## 2022-06-29 LAB — LIPID PANEL
Chol/HDL Ratio: 3.3 ratio (ref 0.0–4.4)
Cholesterol, Total: 197 mg/dL (ref 100–199)
HDL: 59 mg/dL (ref >39)
LDL Chol Calc (NIH): 111 mg/dL — ABNORMAL HIGH (ref 0–99)
Triglycerides: 158 mg/dL — ABNORMAL HIGH (ref 0–149)
VLDL Cholesterol Cal: 27 mg/dL (ref 5–40)

## 2022-06-29 LAB — HEMOGLOBIN A1C
Est. average glucose Bld gHb Est-mCnc: 157 mg/dL
Hgb A1c MFr Bld: 7.1 % — ABNORMAL HIGH (ref 4.8–5.6)

## 2022-06-29 LAB — VITAMIN B12: Vitamin B-12: 889 pg/mL (ref 232–1245)

## 2022-07-05 ENCOUNTER — Ambulatory Visit (INDEPENDENT_AMBULATORY_CARE_PROVIDER_SITE_OTHER): Payer: Medicare Other | Admitting: "Endocrinology

## 2022-07-05 ENCOUNTER — Encounter: Payer: Self-pay | Admitting: "Endocrinology

## 2022-07-05 VITALS — BP 120/74 | HR 80 | Ht 66.0 in | Wt 260.8 lb

## 2022-07-05 DIAGNOSIS — E559 Vitamin D deficiency, unspecified: Secondary | ICD-10-CM

## 2022-07-05 DIAGNOSIS — E1165 Type 2 diabetes mellitus with hyperglycemia: Secondary | ICD-10-CM | POA: Diagnosis not present

## 2022-07-05 DIAGNOSIS — E66812 Obesity, class 2: Secondary | ICD-10-CM

## 2022-07-05 DIAGNOSIS — I10 Essential (primary) hypertension: Secondary | ICD-10-CM

## 2022-07-05 DIAGNOSIS — Z6838 Body mass index (BMI) 38.0-38.9, adult: Secondary | ICD-10-CM

## 2022-07-05 DIAGNOSIS — E782 Mixed hyperlipidemia: Secondary | ICD-10-CM

## 2022-07-05 MED ORDER — TRULICITY 3 MG/0.5ML ~~LOC~~ SOAJ: SUBCUTANEOUS | 1 refills | Status: DC

## 2022-07-05 MED ORDER — METFORMIN HCL ER (OSM) 500 MG PO TB24: 500.0000 mg | ORAL_TABLET | Freq: Every day | ORAL | 1 refills | Status: DC

## 2022-07-05 NOTE — Patient Instructions (Signed)
                                     Advice for Weight Management  -For most of us the best way to lose weight is by diet management. Generally speaking, diet management means consuming less calories intentionally which over time brings about progressive weight loss.  This can be achieved more effectively by avoiding ultra processed carbohydrates, processed meats, unhealthy fats.    It is critically important to know your numbers: how much calorie you are consuming and how much calorie you need. More importantly, our carbohydrates sources should be unprocessed naturally occurring  complex starch food items.  It is always important to balance nutrition also by  appropriate intake of proteins (mainly plant-based), healthy fats/oils, plenty of fruits and vegetables.   -The American College of Lifestyle Medicine (ACL M) recommends nutrition derived mostly from Whole Food, Plant Predominant Sources example an apple instead of applesauce or apple pie. Eat Plenty of vegetables, Mushrooms, fruits, Legumes, Whole Grains, Nuts, seeds in lieu of processed meats, processed snacks/pastries red meat, poultry, eggs.  Use only water or unsweetened tea for hydration.  The College also recommends the need to stay away from risky substances including alcohol, smoking; obtaining 7-9 hours of restorative sleep, at least 150 minutes of moderate intensity exercise weekly, importance of healthy social connections, and being mindful of stress and seek help when it is overwhelming.    -Sticking to a routine mealtime to eat 3 meals a day and avoiding unnecessary snacks is shown to have a big role in weight control. Under normal circumstances, the only time we burn stored energy is when we are hungry, so allow  some hunger to take place- hunger means no food between appropriate meal times, only water.  It is not advisable to starve.   -It is better to avoid simple carbohydrates including:  Cakes, Sweet Desserts, Ice Cream, Soda (diet and regular), Sweet Tea, Candies, Chips, Cookies, Store Bought Juices, Alcohol in Excess of  1-2 drinks a day, Lemonade,  Artificial Sweeteners, Doughnuts, Coffee Creamers, "Sugar-free" Products, etc, etc.  This is not a complete list.....    -Consulting with certified diabetes educators is proven to provide you with the most accurate and current information on diet.  Also, you may be  interested in discussing diet options/exchanges , we can schedule a visit with [name], RDN, CDE for individualized nutrition education.  -Exercise: If you are able: 30 -60 minutes a day ,4 days a week, or 150 minutes of moderate intensity exercise weekly.    The longer the better if tolerated.  Combine stretch, strength, and aerobic activities.  If you were told in the past that you have high risk for cardiovascular diseases, or if you are currently symptomatic, you may seek evaluation by your heart doctor prior to initiating moderate to intense exercise programs.                                  Additional Care Considerations for Diabetes/Prediabetes   -Diabetes  is a chronic disease.  The most important care consideration is regular follow-up with your diabetes care provider with the goal being avoiding or delaying its complications and to take advantage of advances in medications and technology.  If appropriate actions are taken early enough, type 2 diabetes can even be   reversed.  Seek information from the right source.  - Whole Food, Plant Predominant Nutrition is highly recommended: Eat Plenty of vegetables, Mushrooms, fruits, Legumes, Whole Grains, Nuts, seeds in lieu of processed meats, processed snacks/pastries red meat, poultry, eggs as recommended by American College of  Lifestyle Medicine (ACLM).  -Type 2 diabetes is known to coexist with other important comorbidities such as high blood pressure and high cholesterol.  It is critical to control not only the  diabetes but also the high blood pressure and high cholesterol to minimize and delay the risk of complications including coronary artery disease, stroke, amputations, blindness, etc.  The good news is that this diet recommendation for type 2 diabetes is also very helpful for managing high cholesterol and high blood blood pressure.  - Studies showed that people with diabetes will benefit from a class of medications known as ACE inhibitors and statins.  Unless there are specific reasons not to be on these medications, the standard of care is to consider getting one from these groups of medications at an optimal doses.  These medications are generally considered safe and proven to help protect the heart and the kidneys.    - People with diabetes are encouraged to initiate and maintain regular follow-up with eye doctors, foot doctors, dentists , and if necessary heart and kidney doctors.     - It is highly recommended that people with diabetes quit smoking or stay away from smoking, and get yearly  flu vaccine and pneumonia vaccine at least every 5 years.  See above for additional recommendations on exercise, sleep, stress management , and healthy social connections.      

## 2022-07-05 NOTE — Progress Notes (Signed)
07/05/2022, 6:51 PM                                     Endocrinology follow-up note   Subjective:    Patient ID: [name], female    DOB: March 17, 1961.  [name] is being seen in follow up after she was seen in consultation for management of currently uncontrolled symptomatic diabetes requested by  Sharilyn Sites, MD.   Past Medical History:  Diagnosis Date   Adenomatous polyp 2009   Anxiety    Arthritis    Bipolar 1 disorder ([place])    Constipation    Depression    Diabetes mellitus    Diabetes mellitus, type II ([place])    Diverticula of colon 2009   Generalized headaches    GERD (gastroesophageal reflux disease)    History of hiatal hernia    small   Hypertension    no meds   Migraine    Neuropathy    both feet   Obesity    Pelvic floor dysfunction    abnormal anorectal manometry at [facility] in 2009   Plantar fasciitis both feet   Sleep apnea    cpap setting of 3.5    Past Surgical History:  Procedure Laterality Date   ABDOMINAL HYSTERECTOMY     compete   CATARACT EXTRACTION W/PHACO Right 02/15/2017   Procedure: CATARACT EXTRACTION PHACO AND INTRAOCULAR LENS PLACEMENT ([place]);  Surgeon: Rutherford Guys, MD;  Location: [facility];  Service: Ophthalmology;  Laterality: Right;  CDE: 4.18   CATARACT EXTRACTION W/PHACO Left 03/01/2017   Procedure: CATARACT EXTRACTION PHACO AND INTRAOCULAR LENS PLACEMENT ([facility]);  Surgeon: Rutherford Guys, MD;  Location: [facility];  Service: Ophthalmology;  Laterality: Left;  CDE: 2.56   COLON RESECTION  03/30/2002   with end-colostomy and Hartmann's pouch   COLON SURGERY     complicated diverticulitis requiring sigmoid resection with colostomy and subsequent takedown   COLONOSCOPY  12/11/2007   Dr. Gala Romney- marginal prep, normal rectum pancolonic diverticula, adenomatous polyp   COLONOSCOPY  08/28/2003    Wide open colonic anastomosis/Scattered diverticula noted throughout colon/ Small external hemorrhoids    COLONOSCOPY  04/2012   [facility]: hyperplastic polyps, diverticulosis, ileocolonic anastomosis.   COLONOSCOPY WITH PROPOFOL N/A 06/07/2016   Sigmoid diverticulosis, s/p prior segmental resection. 5 year surveillance. Personal history of polyps in past.   COLONOSCOPY WITH PROPOFOL N/A 12/23/2021   Procedure: COLONOSCOPY WITH PROPOFOL;  Surgeon: Daneil Dolin, MD;  [facility];  Service: Endoscopy;  Laterality: N/A;  2:45pm, asa 3, knows new arrival time per [place]  07/10/2002   ESOPHAGOGASTRODUODENOSCOPY N/A 01/08/2020   Procedure: UPPER GASTROINTESTINAL ENDOSCOPY;  Surgeon: Alphonsa Overall, MD;  Location: [facility];  Service: General;  Laterality: N/A;   ESOPHAGOGASTRODUODENOSCOPY (EGD) WITH PROPOFOL N/A 08/23/2016   Procedure: ESOPHAGOGASTRODUODENOSCOPY (EGD) WITH PROPOFOL;  Surgeon: Alphonsa Overall, MD;  Location: [facility];  Service: General;  Laterality: N/A;   FLEXIBLE SIGMOIDOSCOPY  01/20/2012   [facility]: incomplete/attempted colonoscopy. Inadequate prep precluded examination   HEEL SPUR SURGERY Left 09/11/2013   both have been done   KNEE ARTHROSCOPY  02/04/2004    left knee/partial medial meniscectomy.   KNEE SURGERY     3 arthroscopic  2 on left 1 on right   LAPAROSCOPIC GASTRIC BANDING  2009   LAPAROSCOPIC GASTRIC SLEEVE RESECTION N/A 01/08/2020  Procedure: LAPAROSCOPIC SLEEVE GASTRECTOMY;  Surgeon: Alphonsa Overall, MD;  Location: [facility];  Service: General;  Laterality: N/A;   LAPAROSCOPIC SALPINGOOPHERECTOMY  03/30/2002   POLYPECTOMY  12/23/2021   Procedure: POLYPECTOMY;  Surgeon: Daneil Dolin, MD;  [facility];  Service: Endoscopy;;   TOTAL KNEE ARTHROPLASTY Left 03/27/2021   Procedure: TOTAL KNEE ARTHROPLASTY;  Surgeon: Netta Cedars, MD;  [facility];  Service: Orthopedics;  Laterality: Left;   TOTAL KNEE ARTHROPLASTY Right 07/10/2021   Procedure: TOTAL KNEE ARTHROPLASTY;  Surgeon: Netta Cedars, MD;  [facility];  Service: Orthopedics;   Laterality: Right;   TUBAL LIGATION      Social History   Socioeconomic History   Marital status: Married    Spouse name: Not on file   Number of children: 2   Years of education: college   Highest education level: Not on file  Occupational History   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    Employer: DISABLED  Tobacco Use   Smoking status: Never    Passive exposure: Never   Smokeless tobacco: Never  Vaping Use   Vaping Use: Never used  Substance and Sexual Activity   Alcohol use: Not Currently   Drug use: Never   Sexual activity: Not Currently    Birth control/protection: Surgical  Other Topics Concern   Not on file  Social History Narrative   Not on file   Social Determinants of Health   Financial Resource Strain: Not on file  Food Insecurity: Not on file  Transportation Needs: Not on file  Physical Activity: Not on file  Stress: Not on file  Social Connections: Not on file    Family History  Problem Relation Age of Onset   Ovarian cancer Mother    Heart disease Mother    Anxiety disorder Mother    Cirrhosis Father        deceased age 93   Alcohol abuse Father    Anxiety disorder Sister    Dementia Maternal Grandfather    ADD / ADHD Son    Liver cancer Cousin        age 22, deceased   Drug abuse Cousin    Colon cancer Other        aunt, deceased age 44   Breast cancer Other        aunt, deceased age 42   Bipolar disorder Neg Hx    Depression Neg Hx    OCD Neg Hx    Paranoid behavior Neg Hx    Schizophrenia Neg Hx    Seizures Neg Hx    Sexual abuse Neg Hx    Physical abuse Neg Hx    Sleep apnea Neg Hx     Outpatient Encounter Medications as of 07/05/2022  Medication Sig   metformin (FORTAMET) 500 MG (OSM) 24 hr tablet Take 1 tablet (500 mg total) by mouth daily with breakfast.   ALPRAZolam (XANAX) 1 MG tablet Take 1 tablet (1 mg total) by mouth 3 (three) times daily as needed for anxiety.   amoxicillin (AMOXIL) 500 MG capsule  Take 500 mg by mouth See admin instructions. Take 1000 mg by mouth prior 4 hours prior to dental appointment and take 1000 mg after appointment   ARIPiprazole (ABILIFY) 10 MG tablet Take 1 tablet (10 mg total) by mouth at bedtime.   Ascorbic Acid 500 MG CHEW Take 500 mg by mouth daily.   Blood Glucose Monitoring Suppl (FREESTYLE LITE) DEVI Use to  measure glucose 4 times a day   CALCIUM-VITAMIN D-VITAMIN K PO Take 1 tablet by mouth daily. Chewable   Continuous Blood Gluc Receiver (FREESTYLE LIBRE 2 READER) DEVI As directed   Continuous Blood Gluc Sensor (FREESTYLE LIBRE 2 SENSOR) MISC 1 Piece by Does not apply route every 14 (fourteen) days.   cycloSPORINE (RESTASIS) 0.05 % ophthalmic emulsion Place 2 drops into both eyes 2 (two) times daily.    Dulaglutide (TRULICITY) 3 0000000 SOPN INJECT 3 MG UNDER THE SKIN WEEKLY   escitalopram (LEXAPRO) 20 MG tablet Take 1 tablet (20 mg total) by mouth daily.   fluticasone (FLONASE) 50 MCG/ACT nasal spray Place 2 sprays into the nose daily as needed for allergies.    gabapentin (NEURONTIN) 100 MG capsule Take 1 capsule (100 mg total) by mouth 2 (two) times daily.   glucose blood test strip Test 4 times a day, she has freestyle lite meter   Insulin Pen Needle (B-D ULTRAFINE III SHORT PEN) 31G X 8 MM MISC 1 each by Does not apply route as directed.   linaclotide (LINZESS) 72 MCG capsule Take 1 capsule (72 mcg total) by mouth daily before breakfast.   loratadine (CLARITIN) 10 MG tablet Take 10 mg by mouth daily.   meclizine (ANTIVERT) 25 MG tablet Take 25 mg by mouth 2 (two) times daily as needed for dizziness.    Menthol, Topical Analgesic, (BENGAY EX) Apply 1 Application topically daily as needed (pain).   methocarbamol (ROBAXIN) 500 MG tablet Take 1 tablet (500 mg total) by mouth every 6 (six) hours as needed for muscle spasms.   methocarbamol (ROBAXIN) 750 MG tablet Take 750-1,500 mg by mouth in the morning and at bedtime.   Multiple Vitamin (MULTIVITAMIN  WITH MINERALS) TABS tablet Take 1 tablet by mouth daily. celebrate multivitamin 2 in 1   Multiple Vitamins-Minerals (IMMUNE SYSTEM BOOSTER PO) Take 1 tablet by mouth daily.   NON FORMULARY Pt uses a cpap nightly   pantoprazole (PROTONIX) 40 MG tablet Take 1 tablet (40 mg total) by mouth 2 (two) times daily. (Patient taking differently: Take 40 mg by mouth daily before breakfast.)   polyethylene glycol (MIRALAX / GLYCOLAX) 17 g packet Take 17 g by mouth daily.   Probiotic Product (PROBIOTIC PO) Take 1 tablet by mouth daily.   Propylene Glycol-Glycerin 0.6-0.6 % SOLN Place 2 drops into both eyes 2 (two) times daily.   rizatriptan (MAXALT-MLT) 10 MG disintegrating tablet Take 10 mg by mouth as needed for migraine. May repeat in 2 hours if needed   Sod Picosulfate-Mag Ox-Cit Acd (CLENPIQ) 10-3.5-12 MG-GM -GM/175ML SOLN Take 1 kit by mouth as directed.   temazepam (RESTORIL) 30 MG capsule Take 1 capsule (30 mg total) by mouth at bedtime.   [DISCONTINUED] Dulaglutide (TRULICITY) 3 0000000 SOPN INJECT 3 MG UNDER THE SKIN WEEKLY   No facility-administered encounter medications on file as of 07/05/2022.    ALLERGIES: Allergies  Allergen Reactions   Bactrim Itching, Nausea And Vomiting and Rash    Bactrim brand name. Redness.    VACCINATION STATUS: Immunization History  Administered Date(s) Administered   Moderna Sars-Covid-2 Vaccination 07/21/2019, 08/22/2019   Pneumococcal Polysaccharide-23 01/09/2020    Diabetes She presents for her follow-up diabetic visit. She has type 2 diabetes mellitus. Onset time: She was diagnosed at approximate age of 37 years. Her disease course has been worsening (She underwent sleeve gastrectomy on January 08, 2020, and she has lost 14 pounds since her surgery.). There are no hypoglycemic associated symptoms. Pertinent  negatives for hypoglycemia include no confusion, headaches, pallor or seizures. Associated symptoms include fatigue. Pertinent negatives for diabetes  include no chest pain, no polydipsia, no polyphagia and no polyuria. There are no hypoglycemic complications. Symptoms are worsening. Diabetic complications include heart disease. Risk factors for coronary artery disease include dyslipidemia, family history, diabetes mellitus, hypertension, obesity, sedentary lifestyle and post-menopausal. Her weight is fluctuating minimally. She is following a generally unhealthy diet. When asked about meal planning, she reported none. She has not had a previous visit with a dietitian. She never participates in exercise. Her home blood glucose trend is increasing steadily. Her breakfast blood glucose range is generally 140-180 mg/dl. Her lunch blood glucose range is generally 140-180 mg/dl. Her dinner blood glucose range is generally 140-180 mg/dl. Her bedtime blood glucose range is generally 140-180 mg/dl. Her overall blood glucose range is 140-180 mg/dl. [name] presents with her freestyle libre device showing above target readings compared to her last presentation.  Her AGP report shows 75% time range, 20% level 1 hyperglycemia.  Her point-of-care A1c is 7.1% increasing from 6.1%.  She did not document any hypoglycemia.    This patient underwent bariatric surgery twice-gastric banding in 2009, sleeve gastrectomy in 2021.  ) An ACE inhibitor/angiotensin II receptor blocker is being taken. Eye exam is current.  Hypertension This is a chronic problem. The current episode started more than 1 year ago. The problem is uncontrolled. Pertinent negatives include no chest pain, headaches, palpitations or shortness of breath. Risk factors for coronary artery disease include diabetes mellitus, obesity, sedentary lifestyle and post-menopausal state. Past treatments include ACE inhibitors.    Review of systems: Limited as above. Objective:    BP 120/74   Pulse 80   Ht '5\' 6"'$  (1.676 m)   Wt 260 lb 12.8 oz (118.3 kg)   BMI 42.09 kg/m   Wt Readings from Last 3 Encounters:   07/05/22 260 lb 12.8 oz (118.3 kg)  06/24/22 263 lb 6.4 oz (119.5 kg)  12/30/21 248 lb 3.2 oz (112.6 kg)     Physical Exam- Limited  Constitutional:  Body mass index is 42.09 kg/m. , not in acute distress, normal state of mind   CMP     Component Value Date/Time   NA 141 06/28/2022 0908   K 5.1 06/28/2022 0908   CL 100 06/28/2022 0908   CO2 24 06/28/2022 0908   GLUCOSE 145 (H) 06/28/2022 0908   GLUCOSE 122 (H) 12/18/2021 1157   BUN 20 06/28/2022 0908   CREATININE 0.70 06/28/2022 0908   CREATININE 0.74 03/23/2011 0400   CALCIUM 9.7 06/28/2022 0908   PROT 7.3 06/28/2022 0908   ALBUMIN 4.3 06/28/2022 0908   AST 18 06/28/2022 0908   ALT 12 06/28/2022 0908   ALKPHOS 98 06/28/2022 0908   BILITOT 0.4 06/28/2022 0908   GFRNONAA >60 12/18/2021 1157   GFRAA >60 01/01/2020 1356   Recent Results (from the past 2160 hour(s))  HM DIABETES EYE EXAM     Status: None   Collection Time: 06/17/22 12:00 AM  Result Value Ref Range   HM Diabetic Eye Exam No Retinopathy No Retinopathy  Hemoglobin A1c     Status: Abnormal   Collection Time: 06/28/22  9:08 AM  Result Value Ref Range   Hgb A1c MFr Bld 7.1 (H) 4.8 - 5.6 %    Comment:          Prediabetes: 5.7 - 6.4          Diabetes: >6.4  Glycemic control for adults with diabetes: <7.0    Est. average glucose Bld gHb Est-mCnc 157 mg/dL  Comprehensive metabolic panel     Status: Abnormal   Collection Time: 06/28/22  9:08 AM  Result Value Ref Range   Glucose 145 (H) 70 - 99 mg/dL   BUN 20 8 - 27 mg/dL   Creatinine, Ser 0.70 0.57 - 1.00 mg/dL   eGFR 98 >59 mL/min/1.73   BUN/Creatinine Ratio 29 (H) 12 - 28   Sodium 141 134 - 144 mmol/L   Potassium 5.1 3.5 - 5.2 mmol/L   Chloride 100 96 - 106 mmol/L   CO2 24 20 - 29 mmol/L   Calcium 9.7 8.7 - 10.3 mg/dL   Total Protein 7.3 6.0 - 8.5 g/dL   Albumin 4.3 3.9 - 4.9 g/dL   Globulin, Total 3.0 1.5 - 4.5 g/dL   Albumin/Globulin Ratio 1.4 1.2 - 2.2   Bilirubin Total 0.4 0.0 - 1.2  mg/dL   Alkaline Phosphatase 98 44 - 121 IU/L   AST 18 0 - 40 IU/L   ALT 12 0 - 32 IU/L  Lipid Panel     Status: Abnormal   Collection Time: 06/28/22  9:08 AM  Result Value Ref Range   Cholesterol, Total 197 100 - 199 mg/dL   Triglycerides 158 (H) 0 - 149 mg/dL   HDL 59 >39 mg/dL   VLDL Cholesterol Cal 27 5 - 40 mg/dL   LDL Chol Calc (NIH) 111 (H) 0 - 99 mg/dL   Chol/HDL Ratio 3.3 0.0 - 4.4 ratio    Comment:                                   T. Chol/HDL Ratio                                             Men  Women                               1/2 Avg.Risk  3.4    3.3                                   Avg.Risk  5.0    4.4                                2X Avg.Risk  9.6    7.1                                3X Avg.Risk 23.4   11.0   VITAMIN D 25 Hydroxy (Vit-D Deficiency, Fractures)     Status: None   Collection Time: 06/28/22  9:08 AM  Result Value Ref Range   Vit D, 25-Hydroxy 32.6 30.0 - 100.0 ng/mL    Comment: Vitamin D deficiency has been defined by the [place] practice guideline as a level of serum 25-OH vitamin D less than 20 ng/mL (1,2). The Endocrine Society went on to further define vitamin D insufficiency as a level between 21 and  29 ng/mL (2). 1. IOM (Institute of Medicine). 2010. Dietary reference    intakes for calcium and D. [place]: The    [employer]. 2. Holick MF, Binkley Troup, Bischoff-Ferrari HA, et al.    Evaluation, treatment, and prevention of vitamin D    deficiency: an Endocrine Society clinical practice    guideline. JCEM. 2011 Jul; 96(7):1911-30.   Vitamin B12     Status: None   Collection Time: 06/28/22  9:08 AM  Result Value Ref Range   Vitamin B-12 889 232 - 1,245 pg/mL     Assessment & Plan:   1. Uncontrolled type 2 diabetes mellitus with hyperglycemia ([place])  - [name] has currently uncontrolled symptomatic type 2 DM since  62 years of age.  [name] presents with her  freestyle libre device showing above target readings compared to her last presentation.  Her AGP report shows 75% time range, 20% level 1 hyperglycemia.  Her point-of-care A1c is 7.1% increasing from 6.1%.  She did not document any hypoglycemia.    This patient underwent bariatric surgery twice-gastric banding in 2009, sleeve gastrectomy in 2021.  - Recent labs reviewed. - I had a long discussion with her about the progressive nature of diabetes and the pathology behind its complications. -her diabetes is complicated by morbid obesity, sedentary life, hypertension and she remains at a high risk for more acute and chronic complications which include CAD, CVA, CKD, retinopathy, and neuropathy. These are all discussed in detail with her.  - I have counseled her on diet  and weight management  by adopting a carbohydrate restricted/protein rich diet. Patient is encouraged to switch to  unprocessed or minimally processed     complex starch and increased protein intake (animal or plant source), fruits, and vegetables. -  she is advised to stick to a routine mealtimes to eat 3 meals  a day and avoid unnecessary snacks ( to snack only to correct hypoglycemia).   In light of the fact that she underwent bariatric surgery twice,  she is approached for intensive therapeutic lifestyle change.  She would benefit the most from lifestyle medicine.  - she acknowledges that there is a room for improvement in her food and drink choices. - Suggestion is made for her to avoid simple carbohydrates  from her diet including Cakes, Sweet Desserts, Ice Cream, Soda (diet and regular), Sweet Tea, Candies, Chips, Cookies, Store Bought Juices, Alcohol , Artificial Sweeteners,  Coffee Creamer, and "Sugar-free" Products, Lemonade. This will help patient to have more stable blood glucose profile and potentially avoid unintended weight gain.  The following Lifestyle Medicine recommendations according to [place]  [facility]) were discussed and and offered to patient and she  agrees to start the journey:  A. Whole Foods, Plant-Based Nutrition comprising of fruits and vegetables, plant-based proteins, whole-grain carbohydrates was discussed in detail with the patient.   A list for source of those nutrients were also provided to the patient.  Patient will use only water or unsweetened tea for hydration. B.  The need to stay away from risky substances including alcohol, smoking; obtaining 7 to 9 hours of restorative sleep, at least 150 minutes of moderate intensity exercise weekly, the importance of healthy social connections,  and stress management techniques were discussed. C.  A full color page of  Calorie density of various food groups per pound showing examples of each food groups was provided to the patient.   - I have approached her  with the following individualized plan to manage  her diabetes and patient agrees:   Her current A1c is 7.1%.  She will continue to benefit from Trulicity 3 mg subcutaneously weekly.  I discussed and added metformin 500 mg ER p.o. daily at breakfast.  She will continue to benefit from her CGM, advised to wear it at all times.  - Specific targets for  A1c;  LDL, HDL,  and Triglycerides were discussed with the patient.   2) Blood Pressure /Hypertension: -Her blood pressure is controlled to target.   she is advised to continue her current medications including lisinopril 5 mg p.o. daily with breakfast , lisinopril will be increased to  10 mg for next refill.  3) Lipids/Hyperlipidemia:     -Her recent lipid panel showed uncontrolled LDL at 111.  She will be considered for statin therapy after next visit.    4)  Weight/Diet: Her BMI is 42.09-.  She is status post 60+ pounds of weight loss due to sleeve gastrectomy. See lifestyle medicine recommendations above.  5) Chronic Care/Health Maintenance:  -she  is on ACEI/ARB and  is encouraged to initiate and continue to  follow up with Ophthalmology, Dentist,  Podiatrist at least yearly or according to recommendations, and advised to  stay away from smoking. I have recommended yearly flu vaccine and pneumonia vaccine at least every 5 years; moderate intensity exercise for up to 150 minutes weekly; and  sleep for at least 7 hours a day.  Her screening ABI was normal in December 2022, This study will be repeated in 5 years-December 2026, or sooner if needed.  - she is  advised to maintain close follow up with Sharilyn Sites, MD for primary care needs, as well as her other providers for optimal and coordinated care.  I spent  27  minutes in the care of the patient today including review of labs from [place], Lipids, Thyroid Function, Hematology (current and previous including abstractions from other facilities); face-to-face time discussing  her blood glucose readings/logs, discussing hypoglycemia and hyperglycemia episodes and symptoms, medications doses, her options of short and long term treatment based on the latest standards of care / guidelines;  discussion about incorporating lifestyle medicine;  and documenting the encounter. Risk reduction counseling performed per USPSTF guidelines to reduce  obesity and cardiovascular risk factors.     Please refer to Patient Instructions for Blood Glucose Monitoring and Insulin/Medications Dosing Guide"  in media tab for additional information. Please  also refer to " Patient Self Inventory" in the Media  tab for reviewed elements of pertinent patient history.  Francesca Jewett participated in the discussions, expressed understanding, and voiced agreement with the above plans.  All questions were answered to her satisfaction. she is encouraged to contact clinic should she have any questions or concerns prior to her return visit.      Follow up plan: - Return in about 3 months (around 10/03/2022) for Bring Meter/CGM Device/Logs- A1c in Office.  Glade Lloyd, MD [facility] [address] [place], [place] [address] Phone: [phone]  Fax: [phone]    07/05/2022, 6:51 PM  This note was partially dictated with voice recognition software. Similar sounding words can be transcribed inadequately or may not  be corrected upon review.

## 2022-07-07 ENCOUNTER — Other Ambulatory Visit: Payer: Self-pay

## 2022-07-07 MED ORDER — METFORMIN HCL ER 500 MG PO TB24: 500.0000 mg | ORAL_TABLET | Freq: Every day | ORAL | 0 refills | Status: DC

## 2022-07-08 ENCOUNTER — Encounter: Payer: Self-pay | Admitting: Radiology

## 2022-07-08 NOTE — Progress Notes (Signed)
Referring Provider: Sharilyn Sites, MD Primary Care Physician:  Sharilyn Sites, MD Primary GI Physician: Dr. Gala Romney  Chief Complaint  Patient presents with   diverticulitius     Having pain in stomach has been about a month.     HPI:   [name] is a 62 y.o. female with history of GERD, chronic constipation, complicated diverticulitis in the remote past requiring surgery, adenomatous colon polyps due for surveillance colonoscopy in 2028, presenting today with chief complaint of lower abdominal pain.  We have not seen patient since her last colonoscopy in August 2023.   Today: Reports intermittent lower abdominal pain for about 4 weeks.  Can be sharp or achy.  Present at some point at least every day.  Last 1 or 2 hours or can be all day.  She is having trouble with constipation.  She has not been taking Linzess routinely.  She takes MiraLAX daily.  She has bowel movements about twice a week.  She did notice after having a good bowel movement yesterday, she actually felt better.  Denies nausea, vomiting, BRBPR, melena, fever.  Reflux under good control with pantoprazole 40 mg daily.   Colonoscopy 12/23/21: Diverticulosis in the entire examined colon, surgical anastomosis at 20 cm, two 5-6 mm polyps removed, nonbleeding internal hemorrhoids.  Pathology with tubular adenomas.  Recommended repeat in 5 years.   Past Medical History:  Diagnosis Date   Adenomatous polyp 2009   Anxiety    Arthritis    Bipolar 1 disorder ([place])    Constipation    Depression    Diabetes mellitus    Diabetes mellitus, type II ([place])    Diverticula of colon 2009   Generalized headaches    GERD (gastroesophageal reflux disease)    History of hiatal hernia    small   Hypertension    no meds   Migraine    Neuropathy    both feet   Obesity    Pelvic floor dysfunction    abnormal anorectal manometry at [facility] in 2009   Plantar fasciitis both feet   Sleep apnea    cpap setting of 3.5    Past  Surgical History:  Procedure Laterality Date   ABDOMINAL HYSTERECTOMY     compete   CATARACT EXTRACTION W/PHACO Right 02/15/2017   Procedure: CATARACT EXTRACTION PHACO AND INTRAOCULAR LENS PLACEMENT ([place]);  Surgeon: Rutherford Guys, MD;  Location: [facility];  Service: Ophthalmology;  Laterality: Right;  CDE: 4.18   CATARACT EXTRACTION W/PHACO Left 03/01/2017   Procedure: CATARACT EXTRACTION PHACO AND INTRAOCULAR LENS PLACEMENT ([facility]);  Surgeon: Rutherford Guys, MD;  Location: [facility];  Service: Ophthalmology;  Laterality: Left;  CDE: 2.56   COLON RESECTION  03/30/2002   with end-colostomy and Hartmann's pouch   COLON SURGERY     complicated diverticulitis requiring sigmoid resection with colostomy and subsequent takedown   COLONOSCOPY  12/11/2007   Dr. Gala Romney- marginal prep, normal rectum pancolonic diverticula, adenomatous polyp   COLONOSCOPY  08/28/2003    Wide open colonic anastomosis/Scattered diverticula noted throughout colon/ Small external hemorrhoids   COLONOSCOPY  04/2012   [facility]: hyperplastic polyps, diverticulosis, ileocolonic anastomosis.   COLONOSCOPY WITH PROPOFOL N/A 06/07/2016   Sigmoid diverticulosis, s/p prior segmental resection. 5 year surveillance. Personal history of polyps in past.   COLONOSCOPY WITH PROPOFOL N/A 12/23/2021   Procedure: COLONOSCOPY WITH PROPOFOL;  Surgeon: Daneil Dolin, MD;  Location: [facility];  Service: Endoscopy;  Laterality: N/A;  2:45pm, asa 3, knows new  arrival time per Mindy   COLOSTOMY CLOSURE  07/10/2002   ESOPHAGOGASTRODUODENOSCOPY N/A 01/08/2020   Procedure: UPPER GASTROINTESTINAL ENDOSCOPY;  Surgeon: Alphonsa Overall, MD;  Location: [facility];  Service: General;  Laterality: N/A;   ESOPHAGOGASTRODUODENOSCOPY (EGD) WITH PROPOFOL N/A 08/23/2016   Procedure: ESOPHAGOGASTRODUODENOSCOPY (EGD) WITH PROPOFOL;  Surgeon: Alphonsa Overall, MD;  Location: [facility];  Service: General;  Laterality: N/A;   FLEXIBLE SIGMOIDOSCOPY  01/20/2012   RMR:  incomplete/attempted colonoscopy. Inadequate prep precluded examination   HEEL SPUR SURGERY Left 09/11/2013   both have been done   KNEE ARTHROSCOPY  02/04/2004    left knee/partial medial meniscectomy.   KNEE SURGERY     3 arthroscopic  2 on left 1 on right   LAPAROSCOPIC GASTRIC BANDING  2009   LAPAROSCOPIC GASTRIC SLEEVE RESECTION N/A 01/08/2020   Procedure: LAPAROSCOPIC SLEEVE GASTRECTOMY;  Surgeon: Alphonsa Overall, MD;  Location: [facility];  Service: General;  Laterality: N/A;   LAPAROSCOPIC SALPINGOOPHERECTOMY  03/30/2002   POLYPECTOMY  12/23/2021   Procedure: POLYPECTOMY;  Surgeon: Daneil Dolin, MD;  [facility];  Service: Endoscopy;;   TOTAL KNEE ARTHROPLASTY Left 03/27/2021   Procedure: TOTAL KNEE ARTHROPLASTY;  Surgeon: Netta Cedars, MD;  Location: [facility];  Service: Orthopedics;  Laterality: Left;   TOTAL KNEE ARTHROPLASTY Right 07/10/2021   Procedure: TOTAL KNEE ARTHROPLASTY;  Surgeon: Netta Cedars, MD;  Location: [facility];  Service: Orthopedics;  Laterality: Right;   TUBAL LIGATION      Current Outpatient Medications  Medication Sig Dispense Refill   ALPRAZolam (XANAX) 1 MG tablet Take 1 tablet (1 mg total) by mouth 3 (three) times daily as needed for anxiety. 270 tablet 1   Ascorbic Acid 500 MG CHEW Take 500 mg by mouth daily.     Blood Glucose Monitoring Suppl (FREESTYLE LITE) DEVI Use to measure glucose 4 times a day 1 each 0   CALCIUM-VITAMIN D-VITAMIN K PO Take 1 tablet by mouth daily. Chewable     Continuous Blood Gluc Receiver (FREESTYLE LIBRE 2 READER) DEVI As directed 1 each 0   Continuous Blood Gluc Sensor (FREESTYLE LIBRE 2 SENSOR) MISC 1 Piece by Does not apply route every 14 (fourteen) days. 2 each 3   cycloSPORINE (RESTASIS) 0.05 % ophthalmic emulsion Place 2 drops into both eyes 2 (two) times daily.      Dulaglutide (TRULICITY) 3 0000000 SOPN INJECT 3 MG UNDER THE SKIN WEEKLY 6 mL 1   fluticasone (FLONASE) 50 MCG/ACT nasal spray Place 2 sprays into  the nose daily as needed for allergies.      glucose blood test strip Test 4 times a day, she has freestyle lite meter 150 each 2   Insulin Pen Needle (B-D ULTRAFINE III SHORT PEN) 31G X 8 MM MISC 1 each by Does not apply route as directed. 100 each 3   linaclotide (LINZESS) 72 MCG capsule Take 1 capsule (72 mcg total) by mouth daily before breakfast. 90 capsule 3   loratadine (CLARITIN) 10 MG tablet Take 10 mg by mouth daily.     Menthol, Topical Analgesic, (BENGAY EX) Apply 1 Application topically daily as needed (pain).     metFORMIN (GLUCOPHAGE-XR) 500 MG 24 hr tablet Take 1 tablet (500 mg total) by mouth daily with breakfast. 90 tablet 0   Multiple Vitamin (MULTIVITAMIN WITH MINERALS) TABS tablet Take 1 tablet by mouth daily. celebrate multivitamin 2 in 1     Multiple Vitamins-Minerals (IMMUNE SYSTEM BOOSTER PO) Take 1 tablet by mouth daily.  NON FORMULARY Pt uses a cpap nightly     pantoprazole (PROTONIX) 40 MG tablet Take 1 tablet (40 mg total) by mouth 2 (two) times daily. (Patient taking differently: Take 40 mg by mouth daily before breakfast.) 180 tablet 3   polyethylene glycol (MIRALAX / GLYCOLAX) 17 g packet Take 17 g by mouth daily.     Probiotic Product (PROBIOTIC PO) Take 1 tablet by mouth daily.     Propylene Glycol-Glycerin 0.6-0.6 % SOLN Place 2 drops into both eyes 2 (two) times daily.     rizatriptan (MAXALT-MLT) 10 MG disintegrating tablet Take 10 mg by mouth as needed for migraine. May repeat in 2 hours if needed     temazepam (RESTORIL) 30 MG capsule Take 1 capsule (30 mg total) by mouth at bedtime. 30 capsule 2   No current facility-administered medications for this visit.    Allergies as of 07/09/2022 - Review Complete 07/09/2022  Allergen Reaction Noted   Bactrim Itching, Nausea And Vomiting, and Rash 01/09/2011    Family History  Problem Relation Age of Onset   Ovarian cancer Mother    Heart disease Mother    Anxiety disorder Mother    Cirrhosis Father         deceased age 38   Alcohol abuse Father    Anxiety disorder Sister    Dementia Maternal Grandfather    ADD / ADHD Son    Liver cancer Cousin        age 38, deceased   Drug abuse Cousin    Colon cancer Other        aunt, deceased age 36   Breast cancer Other        aunt, deceased age 19   Bipolar disorder Neg Hx    Depression Neg Hx    OCD Neg Hx    Paranoid behavior Neg Hx    Schizophrenia Neg Hx    Seizures Neg Hx    Sexual abuse Neg Hx    Physical abuse Neg Hx    Sleep apnea Neg Hx     Social History   Socioeconomic History   Marital status: Married    Spouse name: Not on file   Number of children: 2   Years of education: college   Highest education level: Not on file  Occupational History   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    Employer: DISABLED  Tobacco Use   Smoking status: Never    Passive exposure: Never   Smokeless tobacco: Never  Vaping Use   Vaping Use: Never used  Substance and Sexual Activity   Alcohol use: Not Currently   Drug use: Never   Sexual activity: Not Currently    Birth control/protection: Surgical  Other Topics Concern   Not on file  Social History Narrative   Not on file   Social Determinants of Health   Financial Resource Strain: Not on file  Food Insecurity: Not on file  Transportation Needs: Not on file  Physical Activity: Not on file  Stress: Not on file  Social Connections: Not on file    Review of Systems: Gen: Denies fever, chills, cold or flulike symptoms, presyncope, syncope. CV: Denies chest pain, palpitations. Resp: Denies dyspnea, cough. GI: See HPI Heme: See HPI  Physical Exam: BP 129/76 (BP Location: Right Arm, Patient Position: Sitting, Cuff Size: Large)   Pulse 73   Temp (!) 96.8 F (36 C) (Temporal)   Ht '5\' 7"'$  (1.702 m)  Wt 216 lb 9.6 oz (98.2 kg)   SpO2 98%   BMI 33.92 kg/m  General:   Alert and oriented. No distress noted. Pleasant and cooperative.  Head:  Normocephalic and  atraumatic. Eyes:  Conjuctiva clear without scleral icterus. Heart:  S1, S2 present without murmurs appreciated. Lungs:  Clear to auscultation bilaterally. No wheezes, rales, or rhonchi. No distress.  Abdomen:  +BS, soft, and non-distended.  Mild TTP in LLQ and RLQ.  No rebound or guarding. No HSM or masses noted. Msk:  Symmetrical without gross deformities. Normal posture. Extremities:  Without edema. Neurologic:  Alert and  oriented x4 Psych:  Normal mood and affect.    Assessment:  62 y.o. female with history of chronic constipation, complicated diverticulitis in the remote past requiring surgery, adenomatous colon polyps due for surveillance colonoscopy in 2028, presenting today with chief complaint of lower abdominal pain.  Lower abdominal pain: 4+ week history of intermittent lower abdominal pain.  Suspect symptoms are secondary to uncontrolled constipation as she notes improvement after having a good bowel movement yesterday.  She has no alarm symptoms.  On exam, she has mild TTP in the RLQ and LLQ.  I do not suspect diverticulitis at this time.  Recommended resuming Linzess 72 mcg daily as this previously worked well for her.  She will monitor her symptoms and let me know of any persistent pain despite better management of constipation; at that time, we would arrange for a CT scan.  If any worsening symptoms over the weekend, recommended ER evaluation.  Constipation:  Chronic.  Not adequately managed.  Previously well-controlled with Linzess 72 mcg daily, but only taking about twice a week at this point.  Recommended resuming Linzess daily.  GERD: Chronic.  Well-controlled on pantoprazole 40 mg daily.   Plan:  Resume Linzess 72 mcg daily. Requested progress report next week.  If persistent abdominal pain despite better management of constipation, will arrange a CT scan. Recommended ER evaluation if worsening symptoms over the weekend. Continue pantoprazole 40 mg daily. Follow-up  in 3 months or sooner if needed.   Aliene Altes, PA-C [facility] 07/09/2022

## 2022-07-09 ENCOUNTER — Other Ambulatory Visit: Payer: Self-pay

## 2022-07-09 ENCOUNTER — Ambulatory Visit (INDEPENDENT_AMBULATORY_CARE_PROVIDER_SITE_OTHER): Payer: Medicare Other | Admitting: Gastroenterology

## 2022-07-09 ENCOUNTER — Encounter: Payer: Self-pay | Admitting: Gastroenterology

## 2022-07-09 VITALS — BP 129/76 | HR 73 | Temp 96.8°F | Ht 67.0 in | Wt 216.6 lb

## 2022-07-09 DIAGNOSIS — K219 Gastro-esophageal reflux disease without esophagitis: Secondary | ICD-10-CM | POA: Diagnosis not present

## 2022-07-09 DIAGNOSIS — E1165 Type 2 diabetes mellitus with hyperglycemia: Secondary | ICD-10-CM

## 2022-07-09 DIAGNOSIS — K5900 Constipation, unspecified: Secondary | ICD-10-CM | POA: Diagnosis not present

## 2022-07-09 DIAGNOSIS — R1030 Lower abdominal pain, unspecified: Secondary | ICD-10-CM | POA: Diagnosis not present

## 2022-07-09 MED ORDER — TRULICITY 3 MG/0.5ML ~~LOC~~ SOAJ: SUBCUTANEOUS | 1 refills | Status: DC

## 2022-07-09 NOTE — Patient Instructions (Addendum)
Restart your Linzess 72 mcg daily.   Call with a progress report next week. If persistent pain despite better management of constipation, we will arrange a CT for you.   If any worsening symptoms over the weekend, proceed to the emergency room.   Continue your pantoprazole daily for reflux.   We will follow-up in 3 months or sooner if needed.   It was a pleasure to see you today! I want to create trusting relationships with patients. If you receive a survey regarding your visit,  I greatly appreciate you taking time to fill this out on paper or through your MyChart. I value your feedback.  Aliene Altes, PA-C [facility]

## 2022-07-22 DIAGNOSIS — G4730 Sleep apnea, unspecified: Secondary | ICD-10-CM | POA: Diagnosis not present

## 2022-07-22 DIAGNOSIS — G4733 Obstructive sleep apnea (adult) (pediatric): Secondary | ICD-10-CM | POA: Diagnosis not present

## 2022-08-06 ENCOUNTER — Emergency Department (HOSPITAL_COMMUNITY): Payer: Medicare Other

## 2022-08-06 ENCOUNTER — Other Ambulatory Visit: Payer: Self-pay

## 2022-08-06 ENCOUNTER — Emergency Department (HOSPITAL_COMMUNITY)
Admission: EM | Admit: 2022-08-06 | Discharge: 2022-08-06 | Disposition: A | Discharge status: Home / Self Care | Payer: Medicare Other | Attending: Emergency Medicine | Admitting: Emergency Medicine

## 2022-08-06 DIAGNOSIS — R0789 Other chest pain: Secondary | ICD-10-CM | POA: Diagnosis not present

## 2022-08-06 DIAGNOSIS — K449 Diaphragmatic hernia without obstruction or gangrene: Secondary | ICD-10-CM | POA: Diagnosis not present

## 2022-08-06 DIAGNOSIS — J9811 Atelectasis: Secondary | ICD-10-CM | POA: Diagnosis not present

## 2022-08-06 DIAGNOSIS — I10 Essential (primary) hypertension: Secondary | ICD-10-CM | POA: Insufficient documentation

## 2022-08-06 DIAGNOSIS — E119 Type 2 diabetes mellitus without complications: Secondary | ICD-10-CM | POA: Diagnosis not present

## 2022-08-06 DIAGNOSIS — R072 Precordial pain: Secondary | ICD-10-CM | POA: Diagnosis not present

## 2022-08-06 DIAGNOSIS — R079 Chest pain, unspecified: Secondary | ICD-10-CM | POA: Diagnosis not present

## 2022-08-06 DIAGNOSIS — Z794 Long term (current) use of insulin: Secondary | ICD-10-CM | POA: Insufficient documentation

## 2022-08-06 DIAGNOSIS — G43909 Migraine, unspecified, not intractable, without status migrainosus: Secondary | ICD-10-CM | POA: Insufficient documentation

## 2022-08-06 DIAGNOSIS — Z79899 Other long term (current) drug therapy: Secondary | ICD-10-CM | POA: Diagnosis not present

## 2022-08-06 DIAGNOSIS — G43811 Other migraine, intractable, with status migrainosus: Secondary | ICD-10-CM

## 2022-08-06 DIAGNOSIS — R519 Headache, unspecified: Secondary | ICD-10-CM | POA: Diagnosis not present

## 2022-08-06 DIAGNOSIS — G43809 Other migraine, not intractable, without status migrainosus: Secondary | ICD-10-CM | POA: Diagnosis not present

## 2022-08-06 LAB — CBC WITH DIFFERENTIAL/PLATELET
Abs Immature Granulocytes: 0.05 10*3/uL (ref 0.00–0.07)
Basophils Absolute: 0.1 10*3/uL (ref 0.0–0.1)
Basophils Relative: 1 %
Eosinophils Absolute: 0.3 10*3/uL (ref 0.0–0.5)
Eosinophils Relative: 4 %
HCT: 45.9 % (ref 36.0–46.0)
Hemoglobin: 15.3 g/dL — ABNORMAL HIGH (ref 12.0–15.0)
Immature Granulocytes: 1 %
Lymphocytes Relative: 23 %
Lymphs Abs: 1.8 10*3/uL (ref 0.7–4.0)
MCH: 29.4 pg (ref 26.0–34.0)
MCHC: 33.3 g/dL (ref 30.0–36.0)
MCV: 88.3 fL (ref 80.0–100.0)
Monocytes Absolute: 0.7 10*3/uL (ref 0.1–1.0)
Monocytes Relative: 9 %
Neutro Abs: 4.9 10*3/uL (ref 1.7–7.7)
Neutrophils Relative %: 62 %
Platelets: 220 10*3/uL (ref 150–400)
RBC: 5.2 MIL/uL — ABNORMAL HIGH (ref 3.87–5.11)
RDW: 13.8 % (ref 11.5–15.5)
WBC: 7.7 10*3/uL (ref 4.0–10.5)
nRBC: 0 % (ref 0.0–0.2)

## 2022-08-06 LAB — TROPONIN I (HIGH SENSITIVITY)
Troponin I (High Sensitivity): 3 ng/L (ref <18)
Troponin I (High Sensitivity): 3 ng/L (ref <18)

## 2022-08-06 LAB — COMPREHENSIVE METABOLIC PANEL
ALT: 19 U/L (ref 0–44)
AST: 23 U/L (ref 15–41)
Albumin: 4.2 g/dL (ref 3.5–5.0)
Alkaline Phosphatase: 80 U/L (ref 38–126)
Anion gap: 9 (ref 5–15)
BUN: 23 mg/dL (ref 8–23)
CO2: 27 mmol/L (ref 22–32)
Calcium: 9 mg/dL (ref 8.9–10.3)
Chloride: 101 mmol/L (ref 98–111)
Creatinine, Ser: 0.64 mg/dL (ref 0.44–1.00)
GFR, Estimated: >60 mL/min (ref >60)
Glucose, Bld: 104 mg/dL — ABNORMAL HIGH (ref 70–99)
Potassium: 3.8 mmol/L (ref 3.5–5.1)
Sodium: 137 mmol/L (ref 135–145)
Total Bilirubin: 0.6 mg/dL (ref 0.3–1.2)
Total Protein: 7.6 g/dL (ref 6.5–8.1)

## 2022-08-06 MED ORDER — DIPHENHYDRAMINE HCL 50 MG/ML IJ SOLN
25.0000 mg | Freq: Once | INTRAMUSCULAR | Status: AC
  Administered 2022-08-06: 25 mg via INTRAVENOUS
  Filled 2022-08-06: qty 1

## 2022-08-06 MED ORDER — SODIUM CHLORIDE 0.9 % IV SOLN: INTRAVENOUS | Status: DC

## 2022-08-06 MED ORDER — DEXAMETHASONE SODIUM PHOSPHATE 10 MG/ML IJ SOLN
10.0000 mg | Freq: Once | INTRAMUSCULAR | Status: AC
  Administered 2022-08-06: 10 mg via INTRAVENOUS
  Filled 2022-08-06: qty 1

## 2022-08-06 MED ORDER — METOCLOPRAMIDE HCL 5 MG/ML IJ SOLN
10.0000 mg | Freq: Once | INTRAMUSCULAR | Status: AC
  Administered 2022-08-06: 10 mg via INTRAVENOUS
  Filled 2022-08-06: qty 2

## 2022-08-06 MED ORDER — IOHEXOL 350 MG/ML SOLN
100.0000 mL | Freq: Once | INTRAVENOUS | Status: AC | PRN
  Administered 2022-08-06: 100 mL via INTRAVENOUS

## 2022-08-06 MED ORDER — LORAZEPAM 2 MG/ML IJ SOLN
1.0000 mg | Freq: Once | INTRAMUSCULAR | Status: AC
  Administered 2022-08-06: 1 mg via INTRAVENOUS
  Filled 2022-08-06: qty 1

## 2022-08-06 MED ORDER — SODIUM CHLORIDE 0.9 % IV BOLUS
1000.0000 mL | Freq: Once | INTRAVENOUS | Status: AC
  Administered 2022-08-06: 1000 mL via INTRAVENOUS

## 2022-08-06 NOTE — Discharge Instructions (Signed)
Follow-up with your family doctor next week for recheck. 

## 2022-08-06 NOTE — ED Triage Notes (Signed)
Chest pain over right breast for since yesterday.  Hasn't slept in a week.  Under much stress.  Loss husband several weeks ago

## 2022-08-06 NOTE — ED Provider Notes (Addendum)
[name] Provider Note   CSN: [id number] Arrival date & time: 08/06/22  1146     History  Chief Complaint  Patient presents with   Chest Pain    [name] is a 62 y.o. female.  With complaint of right chest pain since yesterday been nonstop.  Improved some here today but not gone.  Patient is been on a lot of stress lost her husband a few weeks ago.  Patient states she has not been sleeping well.  As she states she has not slept in a week.  Patient has a history of migraines but starting the past 3 days has had a more severe migraine than usual.  Patient denies any nausea or vomiting.  Does have some shortness of breath.  Oxygen saturations are 99% heart rate 69 temp normal.  Respirations 15.  Past medical history significant for the migraines bipolar disorder sleep apnea hypertension diabetes gastroesophageal reflux disease the migraines and depression.  Patient is never used tobacco products.       Home Medications Prior to Admission medications   Medication Sig Start Date End Date Taking? Authorizing Provider  ALPRAZolam Duanne Moron) 1 MG tablet Take 1 tablet (1 mg total) by mouth 3 (three) times daily as needed for anxiety. 05/20/22   Cloria Spring, MD  Ascorbic Acid 500 MG CHEW Take 500 mg by mouth daily.    [provider]  Blood Glucose Monitoring Suppl (FREESTYLE LITE) DEVI Use to measure glucose 4 times a day 12/31/19   Cassandria Anger, MD  CALCIUM-VITAMIN D-VITAMIN K PO Take 1 tablet by mouth daily. Chewable    [provider]  Continuous Blood Gluc Receiver (FREESTYLE LIBRE 2 READER) DEVI As directed 12/27/19   Cassandria Anger, MD  Continuous Blood Gluc Sensor (FREESTYLE LIBRE 2 SENSOR) MISC 1 Piece by Does not apply route every 14 (fourteen) days. 12/27/19   Cassandria Anger, MD  cycloSPORINE (RESTASIS) 0.05 % ophthalmic emulsion Place 2 drops into both eyes 2 (two) times daily.      [provider]  Dulaglutide (TRULICITY) 3 0000000 SOPN INJECT 3 MG UNDER THE SKIN WEEKLY 07/09/22   Cassandria Anger, MD  fluticasone (FLONASE) 50 MCG/ACT nasal spray Place 2 sprays into the nose daily as needed for allergies.     [provider]  glucose blood test strip Test 4 times a day, she has freestyle lite meter 12/31/19   Nida, Marella Chimes, MD  Insulin Pen Needle (B-D ULTRAFINE III SHORT PEN) 31G X 8 MM MISC 1 each by Does not apply route as directed. 09/04/19   Cassandria Anger, MD  linaclotide Rolan Lipa) 72 MCG capsule Take 1 capsule (72 mcg total) by mouth daily before breakfast. 11/16/21   Annitta Needs, NP  loratadine (CLARITIN) 10 MG tablet Take 10 mg by mouth daily.    [provider]  Menthol, Topical Analgesic, (BENGAY EX) Apply 1 Application topically daily as needed (pain).    [provider]  metFORMIN (GLUCOPHAGE-XR) 500 MG 24 hr tablet Take 1 tablet (500 mg total) by mouth daily with breakfast. 07/07/22   Cassandria Anger, MD  Multiple Vitamin (MULTIVITAMIN WITH MINERALS) TABS tablet Take 1 tablet by mouth daily. celebrate multivitamin 2 in 1    [provider]  Multiple Vitamins-Minerals (IMMUNE SYSTEM BOOSTER PO) Take 1 tablet by mouth daily.    [provider]  NON FORMULARY Pt uses a cpap nightly  [provider]  pantoprazole (PROTONIX) 40 MG tablet Take 1 tablet (40 mg total) by mouth 2 (two) times daily. Patient taking differently: Take 40 mg by mouth daily before breakfast. 10/08/15   Mahala Menghini, PA-C  polyethylene glycol (MIRALAX / GLYCOLAX) 17 g packet Take 17 g by mouth daily.    [provider]  Probiotic Product (PROBIOTIC PO) Take 1 tablet by mouth daily.    [provider]  Propylene Glycol-Glycerin 0.6-0.6 % SOLN Place 2 drops into both eyes 2 (two) times daily.    [provider]  rizatriptan (MAXALT-MLT) 10 MG disintegrating tablet Take 10 mg by mouth  as needed for migraine. May repeat in 2 hours if needed    [provider]  temazepam (RESTORIL) 30 MG capsule Take 1 capsule (30 mg total) by mouth at bedtime. 05/20/22   Cloria Spring, MD      Allergies    Bactrim    Review of Systems   Review of Systems  Constitutional:  Negative for chills and fever.  HENT:  Negative for ear pain and sore throat.   Eyes:  Negative for pain and visual disturbance.  Respiratory:  Positive for shortness of breath. Negative for cough.   Cardiovascular:  Positive for chest pain. Negative for palpitations.  Gastrointestinal:  Negative for abdominal pain and vomiting.  Genitourinary:  Negative for dysuria and hematuria.  Musculoskeletal:  Negative for arthralgias and back pain.  Skin:  Negative for color change and rash.  Neurological:  Positive for headaches. Negative for seizures and syncope.  All other systems reviewed and are negative.   Physical Exam Updated Vital Signs BP (!) 166/85 (BP Location: Right Arm)   Pulse 69   Temp 98.1 F (36.7 C) (Oral)   Resp 15   Ht 1.702 m (5\' 7" )   Wt 112 kg   SpO2 99%   BMI 38.69 kg/m  Physical Exam Vitals and nursing note reviewed.  Constitutional:      General: She is not in acute distress.    Appearance: Normal appearance. She is well-developed.  HENT:     Head: Normocephalic and atraumatic.  Eyes:     Conjunctiva/sclera: Conjunctivae normal.     Pupils: Pupils are equal, round, and reactive to light.  Cardiovascular:     Rate and Rhythm: Normal rate and regular rhythm.     Heart sounds: No murmur heard. Pulmonary:     Effort: Pulmonary effort is normal. No respiratory distress.     Breath sounds: Normal breath sounds.     Comments: Some mild tenderness to palpation to the right side of the chest.  No crepitance.  No obvious breast mass. Chest:     Chest wall: Tenderness present.  Abdominal:     Palpations: Abdomen is soft.     Tenderness: There is no abdominal tenderness.   Musculoskeletal:        General: No swelling.     Cervical back: Neck supple.  Skin:    General: Skin is warm and dry.     Capillary Refill: Capillary refill takes less than 2 seconds.  Neurological:     General: No focal deficit present.     Mental Status: She is alert and oriented to person, place, and time.     Cranial Nerves: No cranial nerve deficit.     Sensory: No sensory deficit.     Motor: No weakness.  Psychiatric:        Mood and Affect: Mood  normal.     ED Results / Procedures / Treatments   Labs (all labs ordered are listed, but only abnormal results are displayed) Labs Reviewed  CBC WITH DIFFERENTIAL/PLATELET - Abnormal; Notable for the following components:      Result Value   RBC 5.20 (*)    Hemoglobin 15.3 (*)    All other components within normal limits  COMPREHENSIVE METABOLIC PANEL - Abnormal; Notable for the following components:   Glucose, Bld 104 (*)    All other components within normal limits  TROPONIN I (HIGH SENSITIVITY)  TROPONIN I (HIGH SENSITIVITY)    EKG EKG Interpretation  Date/Time:  Friday August 06 2022 12:14:03 EDT Ventricular Rate:  70 PR Interval:  164 QRS Duration: 107 QT Interval:  405 QTC Calculation: 437 R Axis:   -5 Text Interpretation: Sinus rhythm Low voltage, precordial leads Confirmed by Fredia Sorrow [phone]) on 08/06/2022 12:23:54 PM  Radiology DG Chest Port 1 View  Result Date: 08/06/2022 CLINICAL DATA:  Chest pain EXAM: PORTABLE CHEST 1 VIEW COMPARISON:  Radiograph 04/02/2019 FINDINGS: The cardiomediastinal silhouette is unchanged. There is a hiatal hernia. There is no focal airspace consolidation. There is no pleural effusion or evidence of pneumothorax. There is no acute osseous abnormality. Mild thoracic spondylosis. IMPRESSION: No evidence of acute cardiopulmonary disease. Hiatal hernia. Electronically Signed   By: Maurine Simmering M.D.   On: 08/06/2022 12:48    Procedures Procedures    Medications Ordered in  ED Medications - No data to display  ED Course/ Medical Decision Making/ A&P                             Medical Decision Making Amount and/or Complexity of Data Reviewed Labs: ordered. Radiology: ordered.  Risk Prescription drug management.   Patient's initial troponin normal at 3 needs delta troponin.  CBC normal with white count of 7.7 hemoglobin 15.3.  Complete metabolic panel without any abnormalities renal function normal.  Chest x-ray negative for any acute process.  EKG without significant abnormalities.  Because of the shortness of breath and some of the chest wall tenderness.  More concern for possible pulmonary embolus will get CT angio.  Will do head CT migraine cocktail IV fluids Decadron Reglan and Benadryl.  And delta troponin is pending.  Delta troponins without any significant change.  CT angio CT head pending.  With some improvement with migraine cocktail.  Final Clinical Impression(s) / ED Diagnoses Final diagnoses:  Other migraine with status migrainosus, intractable  Precordial pain    Rx / DC Orders ED Discharge Orders     None         Fredia Sorrow, MD 08/06/22 1411    Fredia Sorrow, MD 08/06/22 [phone]

## 2022-08-12 ENCOUNTER — Encounter (HOSPITAL_COMMUNITY): Payer: Self-pay | Admitting: *Deleted

## 2022-08-18 DIAGNOSIS — E1165 Type 2 diabetes mellitus with hyperglycemia: Secondary | ICD-10-CM | POA: Diagnosis not present

## 2022-09-03 ENCOUNTER — Encounter (HOSPITAL_COMMUNITY): Payer: Self-pay

## 2022-09-03 ENCOUNTER — Telehealth (HOSPITAL_COMMUNITY): Payer: Medicare Other | Admitting: Psychiatry

## 2022-09-06 NOTE — Progress Notes (Unsigned)
No show

## 2022-09-08 ENCOUNTER — Encounter (HOSPITAL_COMMUNITY): Payer: Self-pay | Admitting: Psychiatry

## 2022-09-08 ENCOUNTER — Telehealth (INDEPENDENT_AMBULATORY_CARE_PROVIDER_SITE_OTHER): Payer: Medicare Other | Admitting: Psychiatry

## 2022-09-08 DIAGNOSIS — F3162 Bipolar disorder, current episode mixed, moderate: Secondary | ICD-10-CM

## 2022-09-08 MED ORDER — TEMAZEPAM 30 MG PO CAPS: 30.0000 mg | ORAL_CAPSULE | Freq: Every day | ORAL | 2 refills | Status: DC

## 2022-09-08 MED ORDER — ALPRAZOLAM 1 MG PO TABS: 1.0000 mg | ORAL_TABLET | Freq: Three times a day (TID) | ORAL | 1 refills | Status: DC | PRN

## 2022-09-08 MED ORDER — ESCITALOPRAM OXALATE 20 MG PO TABS: 20.0000 mg | ORAL_TABLET | Freq: Every day | ORAL | 2 refills | Status: DC

## 2022-09-08 MED ORDER — ARIPIPRAZOLE 10 MG PO TABS: 10.0000 mg | ORAL_TABLET | Freq: Every day | ORAL | 2 refills | Status: DC

## 2022-09-08 NOTE — Progress Notes (Signed)
Virtual Visit via Video Note  I connected with [name] on 09/08/22 at 10:20 AM EDT by a video enabled telemedicine application and verified that I am speaking with the correct person using two identifiers.  Location: Patient: home Provider: office   I discussed the limitations of evaluation and management by telemedicine and the availability of in person appointments. The patient expressed understanding and agreed to proceed.      I discussed the assessment and treatment plan with the patient. The patient was provided an opportunity to ask questions and all were answered. The patient agreed with the plan and demonstrated an understanding of the instructions.   The patient was advised to call back or seek an in-person evaluation if the symptoms worsen or if the condition fails to improve as anticipated.  I provided 20 minutes of non-face-to-face time during this encounter.   [name], MD  [facility] MD/PA/NP OP Progress Note  09/08/2022 10:42 AM [name]  MRN:  [id number]  Chief Complaint:  Chief Complaint  Patient presents with   Depression   Anxiety   Follow-up   HPI: This patient is a 62 year old widowed white female lives alone in [place].  She has 2 sons who live outside the home.  The patient returns after about 4 months regarding her depression anxiety and mood swings.  Unfortunately she states that her husband died of a hemorrhagic brain stroke on March 16.  It seemed to happen very suddenly.  He had been taking very good care of himself.  Since then the patient has been very busy with paperwork with the [place].  She is of course sad and grieving his death but yet she acknowledges that he was difficult and demanding much of the time.  She is now more free to go and do things with friends and other groups at church.  She feels like her mood has been stable and she is sleeping well.  She denies thoughts of self-harm or suicide.  She denies significant manic  symptoms such as racing thoughts or irritability.  She is attending a grief support group. Visit Diagnosis:    ICD-10-CM   1. Bipolar 1 disorder, mixed, moderate (HCC)  F31.62       Past Psychiatric History: Long-term outpatient treatment  Past Medical History:  Past Medical History:  Diagnosis Date   Adenomatous polyp 2009   Anxiety    Arthritis    Bipolar 1 disorder (HCC)    Constipation    Depression    Diabetes mellitus    Diabetes mellitus, type II (HCC)    Diverticula of colon 2009   Generalized headaches    GERD (gastroesophageal reflux disease)    History of hiatal hernia    small   Hypertension    no meds   Migraine    Neuropathy    both feet   Obesity    Pelvic floor dysfunction    abnormal anorectal manometry at [facility] in 2009   Plantar fasciitis both feet   Sleep apnea    cpap setting of 3.5    Past Surgical History:  Procedure Laterality Date   ABDOMINAL HYSTERECTOMY     compete   CATARACT EXTRACTION W/PHACO Right 02/15/2017   Procedure: CATARACT EXTRACTION PHACO AND INTRAOCULAR LENS PLACEMENT (IOC);  Surgeon: Jethro Bolus, MD;  Location: [facility];  Service: Ophthalmology;  Laterality: Right;  CDE: 4.18   CATARACT EXTRACTION W/PHACO Left 03/01/2017   Procedure: CATARACT EXTRACTION PHACO AND INTRAOCULAR LENS PLACEMENT (IOC);  Surgeon: Jethro Bolus, MD;  Location: [facility];  Service: Ophthalmology;  Laterality: Left;  CDE: 2.56   COLON RESECTION  03/30/2002   with end-colostomy and Hartmann's pouch   COLON SURGERY     complicated diverticulitis requiring sigmoid resection with colostomy and subsequent takedown   COLONOSCOPY  12/11/2007   Dr. Jena Gauss- marginal prep, normal rectum pancolonic diverticula, adenomatous polyp   COLONOSCOPY  08/28/2003    Wide open colonic anastomosis/Scattered diverticula noted throughout colon/ Small external hemorrhoids   COLONOSCOPY  04/2012   [facility]: hyperplastic polyps, diverticulosis, ileocolonic anastomosis.   COLONOSCOPY WITH  PROPOFOL N/A 06/07/2016   Sigmoid diverticulosis, s/p prior segmental resection. 5 year surveillance. Personal history of polyps in past.   COLONOSCOPY WITH PROPOFOL N/A 12/23/2021   Procedure: COLONOSCOPY WITH PROPOFOL;  Surgeon: Corbin Ade, MD;  [facility];  Service: Endoscopy;  Laterality: N/A;  2:45pm, asa 3, knows new arrival time per Mindy   COLOSTOMY CLOSURE  07/10/2002   ESOPHAGOGASTRODUODENOSCOPY N/A 01/08/2020   Procedure: UPPER GASTROINTESTINAL ENDOSCOPY;  Surgeon: Ovidio Kin, MD;  Location: [facility];  Service: General;  Laterality: N/A;   ESOPHAGOGASTRODUODENOSCOPY (EGD) WITH PROPOFOL N/A 08/23/2016   Procedure: ESOPHAGOGASTRODUODENOSCOPY (EGD) WITH PROPOFOL;  Surgeon: Ovidio Kin, MD;  Location: [facility];  Service: General;  Laterality: N/A;   FLEXIBLE SIGMOIDOSCOPY  01/20/2012   R[facility]: incomplete/attempted colonoscopy. Inadequate prep precluded examination   HEEL SPUR SURGERY Left 09/11/2013   both have been done   KNEE ARTHROSCOPY  02/04/2004    left knee/partial medial meniscectomy.   KNEE SURGERY     3 arthroscopic  2 on left 1 on right   LAPAROSCOPIC GASTRIC BANDING  2009   LAPAROSCOPIC GASTRIC SLEEVE RESECTION N/A 01/08/2020   Procedure: LAPAROSCOPIC SLEEVE GASTRECTOMY;  Surgeon: Ovidio Kin, MD;  [facility];  Service: General;  Laterality: N/A;   LAPAROSCOPIC SALPINGOOPHERECTOMY  03/30/2002   POLYPECTOMY  12/23/2021   Procedure: POLYPECTOMY;  Surgeon: Corbin Ade, MD;  [facility];  Service: Endoscopy;;   TOTAL KNEE ARTHROPLASTY Left 03/27/2021   Procedure: TOTAL KNEE ARTHROPLASTY;  Surgeon: Beverely Low, MD;  [facility];  Service: Orthopedics;  Laterality: Left;   TOTAL KNEE ARTHROPLASTY Right 07/10/2021   Procedure: TOTAL KNEE ARTHROPLASTY;  Surgeon: Beverely Low, MD;  [facility];  Service: Orthopedics;  Laterality: Right;   TUBAL LIGATION      Family Psychiatric History: See below  Family History:   Family History  Problem Relation Age of Onset   Ovarian cancer Mother    Heart disease Mother    Anxiety disorder Mother    Cirrhosis Father        deceased age 6   Alcohol abuse Father    Anxiety disorder Sister    Dementia Maternal Grandfather    ADD / ADHD Son    Liver cancer Cousin        age 5, deceased   Drug abuse Cousin    Colon cancer Other        aunt, deceased age 58   Breast cancer Other        aunt, deceased age 60   Bipolar disorder Neg Hx    Depression Neg Hx    OCD Neg Hx    Paranoid behavior Neg Hx    Schizophrenia Neg Hx    Seizures Neg Hx    Sexual abuse Neg Hx    Physical abuse Neg Hx    Sleep apnea Neg Hx  Social History:  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    Employer: DISABLED  Tobacco Use   Smoking status: Never    Passive exposure: Never   Smokeless tobacco: Never  Vaping Use   Vaping Use: Never used  Substance and Sexual Activity   Alcohol use: Not Currently   Drug use: Never   Sexual activity: Not Currently    Birth control/protection: Surgical  Other Topics Concern   Not on file  Social History Narrative   Not on file   Social Determinants of Health   Financial Resource Strain: Not on file  Food Insecurity: Not on file  Transportation Needs: Not on file  Physical Activity: Not on file  Stress: Not on file  Social Connections: Not on file    Allergies:  Allergies  Allergen Reactions   Bactrim Itching, Nausea And Vomiting and Rash    Bactrim brand name. Redness.    Metabolic Disorder Labs: Lab Results  Component Value Date   HGBA1C 7.1 (H) 06/28/2022   MPG 140 06/29/2021   MPG 139.85 03/17/2021   No results found for: "PROLACTIN" Lab Results  Component Value Date   CHOL 197  06/28/2022   TRIG 158 (H) 06/28/2022   HDL 59 06/28/2022   CHOLHDL 3.3 06/28/2022   LDLCALC 111 (H) 06/28/2022   LDLCALC 105 (H) 06/15/2021   Lab Results  Component Value Date   TSH 1.060 06/15/2021   TSH 1.310 08/13/2020    Therapeutic Level Labs: No results found for: "LITHIUM" No results found for: "VALPROATE" Lab Results  Component Value Date   CBMZ 7.5 09/19/2012   CBMZ 6.6 05/18/2011    Current Medications: Current Outpatient Medications  Medication Sig Dispense Refill   ALPRAZolam (XANAX) 1 MG tablet Take 1 tablet (1 mg total) by mouth 3 (three) times daily as needed for anxiety. 270 tablet 1   ARIPiprazole (ABILIFY) 10 MG tablet Take 1 tablet (10 mg total) by mouth at bedtime. 90 tablet 2   Ascorbic Acid 500 MG CHEW Take 500 mg by mouth daily.     Blood Glucose Monitoring Suppl (FREESTYLE LITE) DEVI Use to measure glucose 4 times a day 1 each 0   CALCIUM-VITAMIN D-VITAMIN K PO Take 1 tablet by mouth daily. Chewable     Continuous Blood Gluc Receiver (FREESTYLE LIBRE 2 READER) DEVI As directed 1 each 0   Continuous Blood Gluc Sensor (FREESTYLE LIBRE 2 SENSOR) MISC 1 Piece by Does not apply route every 14 (fourteen) days. 2 each 3   cycloSPORINE (RESTASIS) 0.05 % ophthalmic emulsion Place 2 drops into both eyes 2 (two) times daily.      Dulaglutide (TRULICITY) 3 MG/0.5ML SOPN INJECT 3 MG UNDER THE SKIN WEEKLY 6 mL 1   escitalopram (LEXAPRO) 20 MG tablet Take 1 tablet (20 mg total) by mouth daily. 90 tablet 2   fluticasone (FLONASE) 50 MCG/ACT nasal spray Place 2 sprays into the nose daily as needed for allergies.      glucose blood test strip Test 4 times a day, she has freestyle lite meter 150 each 2   Insulin Pen Needle (B-D ULTRAFINE III SHORT PEN) 31G X 8 MM MISC 1 each by Does not apply route as directed.  100 each 3   linaclotide (LINZESS) 72 MCG capsule Take 1 capsule (72 mcg total) by mouth daily before breakfast. (Patient not taking: Reported on 08/06/2022) 90  capsule 3   loratadine (CLARITIN) 10 MG tablet Take 10 mg by mouth daily.     Menthol, Topical Analgesic, (BENGAY EX) Apply 1 Application topically daily as needed (pain).     metFORMIN (GLUCOPHAGE-XR) 500 MG 24 hr tablet Take 1 tablet (500 mg total) by mouth daily with breakfast. 90 tablet 0   Multiple Vitamin (MULTIVITAMIN WITH MINERALS) TABS tablet Take 1 tablet by mouth daily. celebrate multivitamin 2 in 1     Multiple Vitamins-Minerals (IMMUNE SYSTEM BOOSTER PO) Take 1 tablet by mouth daily.     NON FORMULARY Pt uses a cpap nightly     pantoprazole (PROTONIX) 40 MG tablet Take 1 tablet (40 mg total) by mouth 2 (two) times daily. (Patient taking differently: Take 40 mg by mouth daily before breakfast.) 180 tablet 3   polyethylene glycol (MIRALAX / GLYCOLAX) 17 g packet Take 17 g by mouth daily.     Probiotic Product (PROBIOTIC PO) Take 1 tablet by mouth daily.     rizatriptan (MAXALT-MLT) 10 MG disintegrating tablet Take 10 mg by mouth as needed for migraine. May repeat in 2 hours if needed     temazepam (RESTORIL) 30 MG capsule Take 1 capsule (30 mg total) by mouth at bedtime. 30 capsule 2   No current facility-administered medications for this visit.     Musculoskeletal: Strength & Muscle Tone: within normal limits Gait & Station: normal Patient leans: N/A  Psychiatric Specialty Exam: Review of Systems  Psychiatric/Behavioral:  Positive for dysphoric mood.   All other systems reviewed and are negative.   There were no vitals taken for this visit.There is no height or weight on file to calculate BMI.  General Appearance: Casual and Fairly Groomed  Eye Contact:  Good  Speech:  Clear and Coherent  Volume:  Normal  Mood:  Dysphoric  Affect:  Tearful  Thought Process:  Goal Directed  Orientation:  Full (Time, Place, and Person)  Thought Content: Rumination   Suicidal Thoughts:  No  Homicidal Thoughts:  No  Memory:  Immediate;   Good Recent;   Good Remote;   Good  Judgement:   Good  Insight:  Good  Psychomotor Activity:  Normal  Concentration:  Concentration: Fair and Attention Span: Fair  Recall:  Good  Fund of Knowledge: Good  Language: Good  Akathisia:  No  Handed:  Right  AIMS (if indicated): not done  Assets:  Communication Skills Desire for Improvement Resilience Social Support Talents/Skills  ADL's:  Intact  Cognition: WNL  Sleep:  Fair   Screenings: PHQ2-9    Flowsheet Row Video Visit from 02/17/2022 in [facility] [place] Video Visit from 11/20/2021 in [facility] at [place] Video Visit from 08/12/2021 in [facility] at [place] Video Visit from 05/14/2021 in [facility] at [place] Video Visit from 12/30/2020 in [facility] at [facility] Total Score 0 0 0 1 0      Flowsheet Row ED from 08/06/2022 in [facility] Emergency Department at [facility] Video Visit from 02/17/2022 in [facility] at [place] Admission (Discharged) from 12/23/2021 in [facility]  C-SSRS RISK CATEGORY No Risk No Risk No Risk        Assessment and Plan:  This patient is a 62 year old female with a history of bipolar disorder.  Despite the recent loss of her husband she seems to be doing fairly well considering the circumstances.  She will continue Lexapro 20 mg daily for depression, Abilify 10 mg at bedtime for mood stabilization, Xanax 1 mg 3 times daily for anxiety and Restoril 30 mg at bedtime for sleep.  She will return to see me in 3 months  Collaboration of Care: Collaboration of Care: Primary Care Provider AEB notes will be shared with PCP at patient's request  Patient/Guardian was advised Release of Information must be obtained prior to any record release in order to collaborate their care with an outside provider. Patient/Guardian was advised if they have not  already done so to contact the registration department to sign all necessary forms in order for [place] to release information regarding their care.   Consent: Patient/Guardian gives verbal consent for treatment and assignment of benefits for services provided during this visit. Patient/Guardian expressed understanding and agreed to proceed.    [name], MD 09/08/2022, 10:42 AM

## 2022-09-24 DIAGNOSIS — R5383 Other fatigue: Secondary | ICD-10-CM | POA: Diagnosis not present

## 2022-09-24 DIAGNOSIS — E569 Vitamin deficiency, unspecified: Secondary | ICD-10-CM | POA: Diagnosis not present

## 2022-09-24 DIAGNOSIS — Z903 Acquired absence of stomach [part of]: Secondary | ICD-10-CM | POA: Diagnosis not present

## 2022-09-24 DIAGNOSIS — Z1321 Encounter for screening for nutritional disorder: Secondary | ICD-10-CM | POA: Diagnosis not present

## 2022-09-27 DIAGNOSIS — E569 Vitamin deficiency, unspecified: Secondary | ICD-10-CM | POA: Diagnosis not present

## 2022-09-27 DIAGNOSIS — R5383 Other fatigue: Secondary | ICD-10-CM | POA: Diagnosis not present

## 2022-09-27 DIAGNOSIS — Z1321 Encounter for screening for nutritional disorder: Secondary | ICD-10-CM | POA: Diagnosis not present

## 2022-09-27 DIAGNOSIS — Z903 Acquired absence of stomach [part of]: Secondary | ICD-10-CM | POA: Diagnosis not present

## 2022-09-30 ENCOUNTER — Other Ambulatory Visit: Payer: Self-pay

## 2022-09-30 LAB — AMB RESULTS CONSOLE CBG: Glucose: 194

## 2022-09-30 MED ORDER — METFORMIN HCL ER 500 MG PO TB24: 500.0000 mg | ORAL_TABLET | Freq: Every day | ORAL | 0 refills | Status: DC

## 2022-09-30 NOTE — Progress Notes (Signed)
Pt has PCP. Pt is going to make apt. To talk about BP. Given lifestyle medicine handout. Pt encouraged to keep diabetes monitoring system [facility]) with her at all times. Pt was asked SDOH questions verbally. No SDOH needs at this time.

## 2022-10-06 DIAGNOSIS — M79671 Pain in right foot: Secondary | ICD-10-CM | POA: Diagnosis not present

## 2022-10-06 DIAGNOSIS — M79672 Pain in left foot: Secondary | ICD-10-CM | POA: Diagnosis not present

## 2022-10-09 ENCOUNTER — Other Ambulatory Visit (HOSPITAL_COMMUNITY): Payer: Self-pay | Admitting: Psychiatry

## 2022-10-15 ENCOUNTER — Ambulatory Visit: Payer: Medicare Other | Admitting: Gastroenterology

## 2022-10-15 ENCOUNTER — Telehealth: Payer: Self-pay

## 2022-10-15 ENCOUNTER — Encounter: Payer: Self-pay | Admitting: *Deleted

## 2022-10-15 MED ORDER — SEMAGLUTIDE(0.25 OR 0.5MG/DOS) 2 MG/3ML ~~LOC~~ SOPN: PEN_INJECTOR | SUBCUTANEOUS | 3 refills | Status: DC

## 2022-10-15 NOTE — Progress Notes (Signed)
Pt attended 09/30/22 screening event where her b/p was 140/75 and her blood sugar was 194. At event, pt confirmed her PCP is Dr. Assunta Found at [facility] in [place] and pt did not identify any SDOH insecurities. Dr. Lamar Blinks documentation does not show up in [facility] but pt states that she has notified her endocrinologist about her blood sugar. She had been unable to obtain her Trulicity due to lack of access to medication bc the manufacturer supplies were currently "short" so her endocrinologist is starting her on another medicine. She also shared her b/p was up because she lost her husband 3 months ago and is having to do a lot of work by herself, in general, as well as ongoing issues following his death. Pt did state she has appt with Dr. Phillips Odor next week and will let him know about her b/p and blood sugar and shared she is trying to eat healthy foods, drink water, get to a health weight. Caller offered her condolences and encouragement for f/u with her drs and all her lifestyle change efforts. No additional health equity team support is indicated at this time.

## 2022-10-15 NOTE — Telephone Encounter (Signed)
I have changed her to Ozempic.  Will have to start at beginner dosing to reintroduce since she has been off GLP1 so long.  [facility]

## 2022-10-15 NOTE — Telephone Encounter (Signed)
Discussed with pt, understanding voiced. 

## 2022-10-15 NOTE — Telephone Encounter (Signed)
Called pt, discussed her elevated glucose. Information relayed to Ronny Bacon, FNP in a different encounter.

## 2022-10-15 NOTE — Telephone Encounter (Signed)
Pt called stating she has not been able to get any dose of Trulicity. She was started back on metformin, she states it has increased her appetite and her glucose has been elevated.  Pt called with high BG readings.   Date Before breakfast Before lunch Before supper Bedtime  10/14/22 253 after breakfast  185 200  10/15/22 225                   Pt taking: metformin 500mg  qd and has been out of her trulicity x 2-3 months. Pt requesting a Rx for replacement of trulicity.

## 2022-10-19 DIAGNOSIS — M5416 Radiculopathy, lumbar region: Secondary | ICD-10-CM | POA: Diagnosis not present

## 2022-10-20 ENCOUNTER — Telehealth (HOSPITAL_COMMUNITY): Payer: Medicare Other | Admitting: Psychiatry

## 2022-10-20 DIAGNOSIS — G4733 Obstructive sleep apnea (adult) (pediatric): Secondary | ICD-10-CM | POA: Diagnosis not present

## 2022-10-20 DIAGNOSIS — G4730 Sleep apnea, unspecified: Secondary | ICD-10-CM | POA: Diagnosis not present

## 2022-10-28 ENCOUNTER — Telehealth: Payer: Self-pay

## 2022-10-28 DIAGNOSIS — E1140 Type 2 diabetes mellitus with diabetic neuropathy, unspecified: Secondary | ICD-10-CM | POA: Diagnosis not present

## 2022-10-28 DIAGNOSIS — E7849 Other hyperlipidemia: Secondary | ICD-10-CM | POA: Diagnosis not present

## 2022-10-28 DIAGNOSIS — K219 Gastro-esophageal reflux disease without esophagitis: Secondary | ICD-10-CM | POA: Diagnosis not present

## 2022-10-28 DIAGNOSIS — Z0001 Encounter for general adult medical examination with abnormal findings: Secondary | ICD-10-CM | POA: Diagnosis not present

## 2022-10-28 DIAGNOSIS — I10 Essential (primary) hypertension: Secondary | ICD-10-CM | POA: Diagnosis not present

## 2022-10-28 DIAGNOSIS — E782 Mixed hyperlipidemia: Secondary | ICD-10-CM | POA: Diagnosis not present

## 2022-10-28 DIAGNOSIS — D518 Other vitamin B12 deficiency anemias: Secondary | ICD-10-CM | POA: Diagnosis not present

## 2022-10-28 NOTE — Telephone Encounter (Signed)
PA done for Linzess . Pt has tried/failed: Linzess , Lubiprostone . Waiting for a response from Cover My meds.

## 2022-10-29 NOTE — Telephone Encounter (Signed)
Pt sent a MyChart message regarding her Rx insurance coverage.

## 2022-11-01 DIAGNOSIS — D518 Other vitamin B12 deficiency anemias: Secondary | ICD-10-CM | POA: Diagnosis not present

## 2022-11-01 DIAGNOSIS — Z0001 Encounter for general adult medical examination with abnormal findings: Secondary | ICD-10-CM | POA: Diagnosis not present

## 2022-11-01 DIAGNOSIS — E1140 Type 2 diabetes mellitus with diabetic neuropathy, unspecified: Secondary | ICD-10-CM | POA: Diagnosis not present

## 2022-11-01 DIAGNOSIS — E7849 Other hyperlipidemia: Secondary | ICD-10-CM | POA: Diagnosis not present

## 2022-11-01 DIAGNOSIS — K219 Gastro-esophageal reflux disease without esophagitis: Secondary | ICD-10-CM | POA: Diagnosis not present

## 2022-11-03 ENCOUNTER — Ambulatory Visit: Payer: Medicare Other | Admitting: "Endocrinology

## 2022-11-09 NOTE — Telephone Encounter (Signed)
Phoned the pt and inquired of her insurance. The pt stated she was going to call me to let me know that her Linzess was denied due to her not having the insurance on file she has. She has [employer] she stated. Advised the pt that I will attempt another PA for her using that insurance and get back in contact with her

## 2022-11-09 NOTE — Telephone Encounter (Signed)
Pt approved for from 10/10/2022 with the coverage end date of 05/09/2098. Pt has been notified of this.

## 2022-11-09 NOTE — Telephone Encounter (Signed)
New PA done for Linzess done today with [employer]. Tried/failed: Linzess and Lubiprostone . Dx used: K59.04 and K59.00. waiting on a response from Cover My Meds.

## 2022-11-17 DIAGNOSIS — E1165 Type 2 diabetes mellitus with hyperglycemia: Secondary | ICD-10-CM | POA: Diagnosis not present

## 2022-11-22 ENCOUNTER — Other Ambulatory Visit (HOSPITAL_COMMUNITY): Payer: Self-pay | Admitting: Psychiatry

## 2022-11-22 ENCOUNTER — Telehealth (HOSPITAL_COMMUNITY): Payer: Self-pay

## 2022-11-22 MED ORDER — TEMAZEPAM 30 MG PO CAPS: 30.0000 mg | ORAL_CAPSULE | Freq: Every day | ORAL | 2 refills | Status: DC

## 2022-11-22 NOTE — Telephone Encounter (Signed)
 sent 

## 2022-11-22 NOTE — Telephone Encounter (Signed)
Medication problem - Call with patient, after she left a message she was out of Temazepam, last filled by [facility] 09/06/22. Patient stated they are telling her they do not have a current order and she is having problems sleeping, only sleeping 2-3 hours a night most nights.  Informed patient Dr. Tenny Craw sent in a new order to [facility] on 09/08/22 + 2 refills and that it was showing they received the e-scription.  Called and spoke to [place], representative with [facility] who stated they never received the order from 09/08/22 and verified they last filled the medication on 09/06/22.  Agreed to have Dr. Tenny Craw resend a new order today to fill and to be sent out to patient as soon as possible. Patient's next appointment 12/09/22.

## 2022-11-22 NOTE — Telephone Encounter (Signed)
Medication management - Called patient to inform Dr. Tenny Craw resent her order for Temazepam to [facility] after they reported not receiving the order from 09/08/22. Patient to call back if any issues with sleep continues once her new order comes to her from them.

## 2022-12-01 ENCOUNTER — Other Ambulatory Visit: Payer: Self-pay | Admitting: *Deleted

## 2022-12-01 ENCOUNTER — Telehealth: Payer: Self-pay | Admitting: *Deleted

## 2022-12-01 DIAGNOSIS — E1165 Type 2 diabetes mellitus with hyperglycemia: Secondary | ICD-10-CM

## 2022-12-01 MED ORDER — METFORMIN HCL ER 500 MG PO TB24: 500.0000 mg | ORAL_TABLET | Freq: Every day | ORAL | 0 refills | Status: DC

## 2022-12-01 NOTE — Telephone Encounter (Signed)
Patient left a voicemail that [facility] had sent in 2 prescriptions on 11/16/22. It was for her Gabapentin 100 mg  and her Metformin.500 mg. The pharmacy had not heard back from out office. I do not see anything on this. Looking at her meds, I do not see the Gabapentin. Patient states that she is almost out of these two meds and would like for [place] to address it as soon as possible.  She also said that the Ozempic was working well.

## 2022-12-01 NOTE — Telephone Encounter (Signed)
Patient had left a message that she was concerned. [facility] had sent a request for [place] to fill her Metformin and the Gabapentin. This was on July 9th and they had not heard from [place] and she was almost out. In reviewing the patient record, I cannot see that we received anything , nor do I see anything about the Gabapentin. Whitney reviewed as well, she gave permission to refill the Metformin , but the Gabapentin she did not see either.  Patient was called and made aware.

## 2022-12-01 NOTE — Telephone Encounter (Signed)
I dont see anything in her med list about Gabapentin (even in the outside medications tab).  I also do not see anything mentioned about Gabapentin in Dr. Dennie Bible last note.  I think that should wait until he gets back for approval.  But we can send in refill for Metformin 500 mg ER daily at breakfast.

## 2022-12-02 NOTE — Telephone Encounter (Signed)
I sent in a prescription for the Metformin. I called and made the patient aware. I also shared that we did not see the Gabapentin, that I would address this with Dr. Isidoro Donning nurse on Monday.

## 2022-12-03 ENCOUNTER — Other Ambulatory Visit: Payer: Self-pay

## 2022-12-03 ENCOUNTER — Other Ambulatory Visit: Payer: Self-pay | Admitting: Gastroenterology

## 2022-12-03 MED ORDER — METFORMIN HCL ER 500 MG PO TB24: 500.0000 mg | ORAL_TABLET | Freq: Every day | ORAL | 0 refills | Status: DC

## 2022-12-09 ENCOUNTER — Telehealth (HOSPITAL_COMMUNITY): Payer: Medicare Other | Admitting: Psychiatry

## 2022-12-10 ENCOUNTER — Encounter (HOSPITAL_COMMUNITY): Payer: Self-pay | Admitting: Psychiatry

## 2022-12-10 ENCOUNTER — Telehealth (HOSPITAL_COMMUNITY): Payer: Medicare Other | Admitting: Psychiatry

## 2022-12-10 DIAGNOSIS — F3162 Bipolar disorder, current episode mixed, moderate: Secondary | ICD-10-CM | POA: Diagnosis not present

## 2022-12-10 MED ORDER — TEMAZEPAM 30 MG PO CAPS: 30.0000 mg | ORAL_CAPSULE | Freq: Every day | ORAL | 2 refills | Status: DC

## 2022-12-10 MED ORDER — ESCITALOPRAM OXALATE 20 MG PO TABS: 20.0000 mg | ORAL_TABLET | Freq: Every day | ORAL | 2 refills | Status: DC

## 2022-12-10 MED ORDER — ALPRAZOLAM 1 MG PO TABS: 1.0000 mg | ORAL_TABLET | Freq: Three times a day (TID) | ORAL | 1 refills | Status: DC | PRN

## 2022-12-10 MED ORDER — ARIPIPRAZOLE 10 MG PO TABS: 10.0000 mg | ORAL_TABLET | Freq: Every day | ORAL | 2 refills | Status: DC

## 2022-12-10 NOTE — Progress Notes (Signed)
Virtual Visit via Telephone Note  I connected with [name] on 12/10/22 at 10:00 AM EDT by telephone and verified that I am speaking with the correct person using two identifiers.  Location: Patient: home Provider: office   I discussed the limitations, risks, security and privacy concerns of performing an evaluation and management service by telephone and the availability of in person appointments. I also discussed with the patient that there may be a patient responsible charge related to this service. The patient expressed understanding and agreed to proceed.     I discussed the assessment and treatment plan with the patient. The patient was provided an opportunity to ask questions and all were answered. The patient agreed with the plan and demonstrated an understanding of the instructions.   The patient was advised to call back or seek an in-person evaluation if the symptoms worsen or if the condition fails to improve as anticipated.  I provided 20 minutes of non-face-to-face time during this encounter.   Samantha Ruder, MD  [facility] MD/PA/NP OP Progress Note  12/10/2022 10:07 AM [name]  MRN:  [id number]  Chief Complaint:  Chief Complaint  Patient presents with   Anxiety   Depression   Manic Behavior   HPI: This patient is a 62 year old widowed white female lives alone in [place].  She has 2 sons who live outside the home.   Patient returns for follow-up after 3 months.  As she noted last time her husband died of a stroke on 08-19-22.  She is now still struggling with this.  They had a difficult relationship and she had to take care of him at the end because he was sickly.  She does have more freedom now but she does feel lonely.  Her sister is trying to keep her busy and occupied.  She is going to the gym several times a week.  The temazepam has helped her sleep.  She noticed that she is doing more "OCD things" like checking various appliances before she leaves her  house.  She does not think this is interfering too much with her life.  She denies significant manic symptoms such as racing thoughts or irritability.  She still thinks the medications are helping her mood. Visit Diagnosis:    ICD-10-CM   1. Bipolar 1 disorder, mixed, moderate (HCC)  F31.62       Past Psychiatric History: Long-term outpatient treatment  Past Medical History:  Past Medical History:  Diagnosis Date   Adenomatous polyp 2009   Anxiety    Arthritis    Bipolar 1 disorder (HCC)    Constipation    Depression    Diabetes mellitus    Diabetes mellitus, type II (HCC)    Diverticula of colon 2009   Generalized headaches    GERD (gastroesophageal reflux disease)    History of hiatal hernia    small   Hypertension    no meds   Migraine    Neuropathy    both feet   Obesity    Pelvic floor dysfunction    abnormal anorectal manometry at [facility] in 2009   Plantar fasciitis both feet   Sleep apnea    cpap setting of 3.5    Past Surgical History:  Procedure Laterality Date   ABDOMINAL HYSTERECTOMY     compete   CATARACT EXTRACTION W/PHACO Right 02/15/2017   Procedure: CATARACT EXTRACTION PHACO AND INTRAOCULAR LENS PLACEMENT (IOC);  Surgeon: Jethro Bolus, MD;  Location: [facility];  Service: Ophthalmology;  Laterality: Right;  CDE: 4.18   CATARACT EXTRACTION W/PHACO Left 03/01/2017   Procedure: CATARACT EXTRACTION PHACO AND INTRAOCULAR LENS PLACEMENT (IOC);  Surgeon: Jethro Bolus, MD;  Location: [facility];  Service: Ophthalmology;  Laterality: Left;  CDE: 2.56   COLON RESECTION  03/30/2002   with end-colostomy and Hartmann's pouch   COLON SURGERY     complicated diverticulitis requiring sigmoid resection with colostomy and subsequent takedown   COLONOSCOPY  12/11/2007   Dr. Jena Gauss- marginal prep, normal rectum pancolonic diverticula, adenomatous polyp   COLONOSCOPY  08/28/2003    Wide open colonic anastomosis/Scattered diverticula noted throughout colon/ Small external  hemorrhoids   COLONOSCOPY  04/2012   [facility]: hyperplastic polyps, diverticulosis, ileocolonic anastomosis.   COLONOSCOPY WITH PROPOFOL N/A 06/07/2016   Sigmoid diverticulosis, s/p prior segmental resection. 5 year surveillance. Personal history of polyps in past.   COLONOSCOPY WITH PROPOFOL N/A 12/23/2021   Procedure: COLONOSCOPY WITH PROPOFOL;  Surgeon: Corbin Ade, MD;  Location: [facility];  Service: Endoscopy;  Laterality: N/A;  2:45pm, asa 3, knows new arrival time per Mindy   COLOSTOMY CLOSURE  07/10/2002   ESOPHAGOGASTRODUODENOSCOPY N/A 01/08/2020   Procedure: UPPER GASTROINTESTINAL ENDOSCOPY;  Surgeon: Ovidio Kin, MD;  Location: [facility];  Service: General;  Laterality: N/A;   ESOPHAGOGASTRODUODENOSCOPY (EGD) WITH PROPOFOL N/A 08/23/2016   Procedure: ESOPHAGOGASTRODUODENOSCOPY (EGD) WITH PROPOFOL;  Surgeon: Ovidio Kin, MD;  Location: [facility];  Service: General;  Laterality: N/A;   FLEXIBLE SIGMOIDOSCOPY  01/20/2012   [facility]: incomplete/attempted colonoscopy. Inadequate prep precluded examination   HEEL SPUR SURGERY Left 09/11/2013   both have been done   KNEE ARTHROSCOPY  02/04/2004    left knee/partial medial meniscectomy.   KNEE SURGERY     3 arthroscopic  2 on left 1 on right   LAPAROSCOPIC GASTRIC BANDING  2009   LAPAROSCOPIC GASTRIC SLEEVE RESECTION N/A 01/08/2020   Procedure: LAPAROSCOPIC SLEEVE GASTRECTOMY;  Surgeon: Ovidio Kin, MD;  Location: [facility];  Service: General;  Laterality: N/A;   LAPAROSCOPIC SALPINGOOPHERECTOMY  03/30/2002   POLYPECTOMY  12/23/2021   Procedure: POLYPECTOMY;  Surgeon: Corbin Ade, MD;  [facility]: [facility];  Service: Endoscopy;;   TOTAL KNEE ARTHROPLASTY Left 03/27/2021   Procedure: TOTAL KNEE ARTHROPLASTY;  Surgeon: Beverely Low, MD;  Location: [facility];  Service: Orthopedics;  Laterality: Left;   TOTAL KNEE ARTHROPLASTY Right 07/10/2021   Procedure: TOTAL KNEE ARTHROPLASTY;  Surgeon: Beverely Low, MD;  Location: [facility];   Service: Orthopedics;  Laterality: Right;   TUBAL LIGATION      Family Psychiatric History: see below  Family History:  Family History  Problem Relation Age of Onset   Ovarian cancer Mother    Heart disease Mother    Anxiety disorder Mother    Cirrhosis Father        deceased age 7   Alcohol abuse Father    Anxiety disorder Sister    Dementia Maternal Grandfather    ADD / ADHD Son    Liver cancer Cousin        age 48, deceased   Drug abuse Cousin    Colon cancer Other        aunt, deceased age 63   Breast cancer Other        aunt, deceased age 32   Bipolar disorder Neg Hx    Depression Neg Hx    OCD Neg Hx    Paranoid behavior Neg Hx    Schizophrenia Neg Hx    Seizures Neg  Hx    Sexual abuse Neg Hx    Physical abuse Neg Hx    Sleep apnea Neg Hx     Social History:  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    Employer: DISABLED  Tobacco Use   Smoking status: Never    Passive exposure: Never   Smokeless tobacco: Never  Vaping Use   Vaping status: Never Used  Substance and Sexual Activity   Alcohol use: Not Currently   Drug use: Never   Sexual activity: Not Currently    Birth control/protection: Surgical  Other Topics Concern   Not on file  Social History Narrative   Not on file   Social Determinants of Health   Financial Resource Strain: Not on file  Food Insecurity: No Food Insecurity (09/30/2022)   Hunger Vital Sign    Worried About Running Out of Food in the Last Year: Never true    Ran Out of Food in the Last Year: Never true  Transportation Needs: No Transportation Needs (09/30/2022)   PRAPARE - Administrator, Civil Service (Medical): No    Lack of Transportation (Non-Medical): No  Physical Activity: Not on  file  Stress: Not on file  Social Connections: Not on file    Allergies:  Allergies  Allergen Reactions   Bactrim Itching, Nausea And Vomiting and Rash    Bactrim brand name. Redness.    Metabolic Disorder Labs: Lab Results  Component Value Date   HGBA1C 7.1 (H) 06/28/2022   MPG 140 06/29/2021   MPG 139.85 03/17/2021   No results found for: "PROLACTIN" Lab Results  Component Value Date   CHOL 197 06/28/2022   TRIG 158 (H) 06/28/2022   HDL 59 06/28/2022   CHOLHDL 3.3 06/28/2022   LDLCALC 111 (H) 06/28/2022   LDLCALC 105 (H) 06/15/2021   Lab Results  Component Value Date   TSH 1.060 06/15/2021   TSH 1.310 08/13/2020    Therapeutic Level Labs: No results found for: "LITHIUM" No results found for: "VALPROATE" Lab Results  Component Value Date   CBMZ 7.5 09/19/2012   CBMZ 6.6 05/18/2011    Current Medications: Current Outpatient Medications  Medication Sig Dispense Refill   ALPRAZolam (XANAX) 1 MG tablet Take 1 tablet (1 mg total) by mouth 3 (three) times daily as needed for anxiety. 270 tablet 1   ARIPiprazole (ABILIFY) 10 MG tablet Take 1 tablet (10 mg total) by mouth at bedtime. 90 tablet 2   Ascorbic Acid 500 MG CHEW Take 500 mg by mouth daily.     Blood Glucose Monitoring Suppl (FREESTYLE LITE) DEVI Use to measure glucose 4 times a day 1 each 0   CALCIUM-VITAMIN D-VITAMIN K PO Take 1 tablet by mouth daily. Chewable     Continuous Blood Gluc Receiver (FREESTYLE LIBRE 2 READER) DEVI As directed 1 each 0   Continuous Blood Gluc Sensor (FREESTYLE LIBRE 2 SENSOR) MISC 1 Piece by Does not apply route every 14 (fourteen) days. 2 each 3   cycloSPORINE (RESTASIS) 0.05 % ophthalmic emulsion Place 2 drops into both eyes 2 (two) times daily.      escitalopram (LEXAPRO) 20 MG tablet Take 1 tablet (20 mg total) by mouth daily. 90 tablet 2  fluticasone (FLONASE) 50 MCG/ACT nasal spray Place 2 sprays into the nose daily as needed for allergies.      glucose blood test strip  Test 4 times a day, she has freestyle lite meter 150 each 2   Insulin Pen Needle (B-D ULTRAFINE III SHORT PEN) 31G X 8 MM MISC 1 each by Does not apply route as directed. 100 each 3   LINZESS 72 MCG capsule TAKE 1 CAPSULE DAILY BEFORE BREAKFAST 90 capsule 3   loratadine (CLARITIN) 10 MG tablet Take 10 mg by mouth daily.     Menthol, Topical Analgesic, (BENGAY EX) Apply 1 Application topically daily as needed (pain).     metFORMIN (GLUCOPHAGE-XR) 500 MG 24 hr tablet Take 1 tablet (500 mg total) by mouth daily with breakfast. 90 tablet 0   Multiple Vitamin (MULTIVITAMIN WITH MINERALS) TABS tablet Take 1 tablet by mouth daily. celebrate multivitamin 2 in 1     Multiple Vitamins-Minerals (IMMUNE SYSTEM BOOSTER PO) Take 1 tablet by mouth daily.     NON FORMULARY Pt uses a cpap nightly     pantoprazole (PROTONIX) 40 MG tablet Take 1 tablet (40 mg total) by mouth 2 (two) times daily. (Patient taking differently: Take 40 mg by mouth daily before breakfast.) 180 tablet 3   polyethylene glycol (MIRALAX / GLYCOLAX) 17 g packet Take 17 g by mouth daily.     Probiotic Product (PROBIOTIC PO) Take 1 tablet by mouth daily.     rizatriptan (MAXALT-MLT) 10 MG disintegrating tablet Take 10 mg by mouth as needed for migraine. May repeat in 2 hours if needed     Semaglutide,0.25 or 0.5MG /DOS, 2 MG/3ML SOPN Inject 0.25 mg SQ weekly x 2 weeks, then increase to 0.5 mg SQ weekly thereafter if tolerated well. 3 mL 3   temazepam (RESTORIL) 30 MG capsule Take 1 capsule (30 mg total) by mouth at bedtime. 30 capsule 2   No current facility-administered medications for this visit.     Musculoskeletal: Strength & Muscle Tone: within normal limits Gait & Station: normal Patient leans: N/A  Psychiatric Specialty Exam: Review of Systems  Psychiatric/Behavioral:  The patient is nervous/anxious.   All other systems reviewed and are negative.   There were no vitals taken for this visit.There is no height or weight on file  to calculate BMI.  General Appearance: Neat and Well Groomed  Eye Contact:  Good  Speech:  Clear and Coherent  Volume:  Normal  Mood:  Euthymic  Affect:  Congruent  Thought Process:  Goal Directed  Orientation:  Full (Time, Place, and Person)  Thought Content: Rumination   Suicidal Thoughts:  No  Homicidal Thoughts:  No  Memory:  Immediate;   Good Recent;   Good Remote;   Fair  Judgement:  Good  Insight:  Good  Psychomotor Activity:  Decreased  Concentration:  Concentration: Good and Attention Span: Good  Recall:  Good  Fund of Knowledge: Good  Language: Good  Akathisia:  No  Handed:  Right  AIMS (if indicated): not done  Assets:  Communication Skills Desire for Improvement Resilience Social Support Talents/Skills  ADL's:  Intact  Cognition: WNL  Sleep:  Good   Screenings: PHQ2-9    Flowsheet Row Video Visit from 02/17/2022 in [facility] [place] Video Visit from 11/20/2021 in [facility] at [place] Video Visit from 08/12/2021 in [facility] at [place] Video Visit from 05[facility]14/2021 in [facility] at  [place] Video Visit from 12/30/2020 in [facility] Total Score 0 0 0 1 0      Flowsheet Row ED from 08/06/2022 in [facility] at [facility] Video Visit from 02/17/2022 in [facility] at [place] Admission (Discharged) from 12/23/2021 in [place] [facility]  C-SSRS RISK CATEGORY No Risk No Risk No Risk        Assessment and Plan: This patient is a 62 year old female with a history of bipolar disorder.  She seems to be doing fairly well given the circumstances of the loss of her husband.  She will continue Lexapro 20 mg daily for depression, Abilify 10 mg at bedtime for mood stabilization, Xanax 1 mg 3 times daily for anxiety and temazepam 30 mg  at bedtime for sleep.  She will return to see me in 3 months  Collaboration of Care: Collaboration of Care: Referral or follow-up with counselor/therapist AEB patient has requested return to [facility] for therapy in our office  Patient/Guardian was advised Release of Information must be obtained prior to any record release in order to collaborate their care with an outside provider. Patient/Guardian was advised if they have not already done so to contact the registration department to sign all necessary forms in order for [place] to release information regarding their care.   Consent: Patient/Guardian gives verbal consent for treatment and assignment of benefits for services provided during this visit. Patient/Guardian expressed understanding and agreed to proceed.    Samantha Ruder, MD 12/10/2022, 10:07 AM

## 2022-12-17 ENCOUNTER — Other Ambulatory Visit: Payer: Self-pay | Admitting: "Endocrinology

## 2022-12-17 ENCOUNTER — Telehealth: Payer: Self-pay

## 2022-12-17 ENCOUNTER — Telehealth: Payer: Self-pay | Admitting: "Endocrinology

## 2022-12-17 MED ORDER — GABAPENTIN 100 MG PO CAPS: 100.0000 mg | ORAL_CAPSULE | Freq: Two times a day (BID) | ORAL | 1 refills | Status: DC

## 2022-12-17 NOTE — Telephone Encounter (Signed)
Pt would like to be put back on Gabapentin says her feet are on fire constantly.  Says she would like to be seen sooner than Sept 24.

## 2022-12-17 NOTE — Telephone Encounter (Signed)
Discussed with pt, understanding voiced. 

## 2022-12-17 NOTE — Telephone Encounter (Signed)
Pt called stating she had taken gabapentin at one time but had not had to take it lately. States she has started experiencing burning and pain in her feet again. Requested a Rx for gabapentin 100mg  2 tablets bid which she had taken before.

## 2022-12-20 NOTE — Telephone Encounter (Signed)
Please advise 

## 2023-01-07 DIAGNOSIS — B3731 Acute candidiasis of vulva and vagina: Secondary | ICD-10-CM | POA: Diagnosis not present

## 2023-01-07 DIAGNOSIS — E1140 Type 2 diabetes mellitus with diabetic neuropathy, unspecified: Secondary | ICD-10-CM | POA: Diagnosis not present

## 2023-01-07 DIAGNOSIS — R0609 Other forms of dyspnea: Secondary | ICD-10-CM | POA: Diagnosis not present

## 2023-01-07 DIAGNOSIS — R002 Palpitations: Secondary | ICD-10-CM | POA: Diagnosis not present

## 2023-01-07 DIAGNOSIS — I4510 Unspecified right bundle-branch block: Secondary | ICD-10-CM | POA: Diagnosis not present

## 2023-01-20 LAB — AMB RESULTS CONSOLE CBG: Glucose: 123

## 2023-01-24 DIAGNOSIS — L820 Inflamed seborrheic keratosis: Secondary | ICD-10-CM | POA: Diagnosis not present

## 2023-01-24 DIAGNOSIS — D225 Melanocytic nevi of trunk: Secondary | ICD-10-CM | POA: Diagnosis not present

## 2023-01-31 ENCOUNTER — Telehealth (HOSPITAL_COMMUNITY): Payer: Self-pay

## 2023-01-31 NOTE — Telephone Encounter (Signed)
Spoke with pt confirmed 02/02/23 appt

## 2023-02-01 ENCOUNTER — Ambulatory Visit (INDEPENDENT_AMBULATORY_CARE_PROVIDER_SITE_OTHER): Payer: Medicare Other | Admitting: "Endocrinology

## 2023-02-01 ENCOUNTER — Encounter: Payer: Self-pay | Admitting: "Endocrinology

## 2023-02-01 VITALS — BP 138/82 | HR 72 | Ht 67.0 in | Wt 268.2 lb

## 2023-02-01 DIAGNOSIS — Z7985 Long-term (current) use of injectable non-insulin antidiabetic drugs: Secondary | ICD-10-CM | POA: Insufficient documentation

## 2023-02-01 DIAGNOSIS — E782 Mixed hyperlipidemia: Secondary | ICD-10-CM

## 2023-02-01 DIAGNOSIS — E1165 Type 2 diabetes mellitus with hyperglycemia: Secondary | ICD-10-CM

## 2023-02-01 DIAGNOSIS — E559 Vitamin D deficiency, unspecified: Secondary | ICD-10-CM

## 2023-02-01 DIAGNOSIS — I10 Essential (primary) hypertension: Secondary | ICD-10-CM | POA: Diagnosis not present

## 2023-02-01 LAB — POCT GLYCOSYLATED HEMOGLOBIN (HGB A1C): HbA1c, POC (controlled diabetic range): 6.9 % (ref 0.0–7.0)

## 2023-02-01 MED ORDER — SEMAGLUTIDE (1 MG/DOSE) 4 MG/3ML ~~LOC~~ SOPN: 1.0000 mg | PEN_INJECTOR | SUBCUTANEOUS | 1 refills | Status: DC

## 2023-02-01 MED ORDER — FREESTYLE LIBRE 3 SENSOR MISC: 1.0000 | 3 refills | Status: DC

## 2023-02-01 MED ORDER — FREESTYLE LIBRE 3 READER DEVI: 1.0000 | Freq: Once | 0 refills | Status: DC | PRN

## 2023-02-01 NOTE — Progress Notes (Signed)
02/01/2023, 6:57 PM                                     Endocrinology follow-up note   Subjective:    Patient ID: [name], female    DOB: 1960-08-03.  [name] is being seen in follow up after she was seen in consultation for management of currently uncontrolled symptomatic diabetes requested by  Assunta Found, MD.   Past Medical History:  Diagnosis Date   Adenomatous polyp 2009   Anxiety    Arthritis    Bipolar 1 disorder (HCC)    Constipation    Depression    Diabetes mellitus    Diabetes mellitus, type II (HCC)    Diverticula of colon 2009   Generalized headaches    GERD (gastroesophageal reflux disease)    History of hiatal hernia    small   Hypertension    no meds   Migraine    Neuropathy    both feet   Obesity    Pelvic floor dysfunction    abnormal anorectal manometry at [facility] in 2009   Plantar fasciitis both feet   Sleep apnea    cpap setting of 3.5    Past Surgical History:  Procedure Laterality Date   ABDOMINAL HYSTERECTOMY     compete   CATARACT EXTRACTION W/PHACO Right 02/15/2017   Procedure: CATARACT EXTRACTION PHACO AND INTRAOCULAR LENS PLACEMENT (IOC);  Surgeon: Jethro Bolus, MD;  Location: [facility];  Service: Ophthalmology;  Laterality: Right;  CDE: 4.18   CATARACT EXTRACTION W/PHACO Left 03/01/2017   Procedure: CATARACT EXTRACTION PHACO AND INTRAOCULAR LENS PLACEMENT (IOC);  Surgeon: Jethro Bolus, MD;  Location: [facility];  Service: Ophthalmology;  Laterality: Left;  CDE: 2.56   COLON RESECTION  03/30/2002   with end-colostomy and Hartmann's pouch   COLON SURGERY     complicated diverticulitis requiring sigmoid resection with colostomy and subsequent takedown   COLONOSCOPY  12/11/2007   Dr. Jena Gauss- marginal prep, normal rectum pancolonic diverticula, adenomatous polyp   COLONOSCOPY  08/28/2003    Wide open colonic anastomosis/Scattered diverticula noted throughout colon/ Small external hemorrhoids    COLONOSCOPY  04/2012   [facility]: hyperplastic polyps, diverticulosis, ileocolonic anastomosis.   COLONOSCOPY WITH PROPOFOL N/A 06/07/2016   Sigmoid diverticulosis, s/p prior segmental resection. 5 year surveillance. Personal history of polyps in past.   COLONOSCOPY WITH PROPOFOL N/A 12/23/2021   Procedure: COLONOSCOPY WITH PROPOFOL;  Surgeon: Corbin Ade, MD;  [facility]: [facility];  Service: Endoscopy;  Laterality: N/A;  2:45pm, asa 3, knows new arrival time per Mindy   COLOSTOMY CLOSURE  07/10/2002   ESOPHAGOGASTRODUODENOSCOPY N/A 01/08/2020   Procedure: UPPER GASTROINTESTINAL ENDOSCOPY;  Surgeon: Ovidio Kin, MD;  Location: [facility];  Service: General;  Laterality: N/A;   ESOPHAGOGASTRODUODENOSCOPY (EGD) WITH PROPOFOL N/A 08/23/2016   Procedure: ESOPHAGOGASTRODUODENOSCOPY (EGD) WITH PROPOFOL;  Surgeon: Ovidio Kin, MD;  Location: [facility];  Service: General;  Laterality: N/A;   FLEXIBLE SIGMOIDOSCOPY  01/20/2012   [facility]: incomplete/attempted colonoscopy. Inadequate prep precluded examination   HEEL SPUR SURGERY Left 09/11/2013   both have been done   KNEE ARTHROSCOPY  02/04/2004    left knee/partial medial meniscectomy.   KNEE SURGERY     3 arthroscopic  2 on left 1 on right   LAPAROSCOPIC GASTRIC BANDING  2009   LAPAROSCOPIC GASTRIC SLEEVE RESECTION N/A 01/08/2020  Procedure: LAPAROSCOPIC SLEEVE GASTRECTOMY;  Surgeon: Ovidio Kin, MD;  Location: [facility];  Service: General;  Laterality: N/A;   LAPAROSCOPIC SALPINGOOPHERECTOMY  03/30/2002   POLYPECTOMY  12/23/2021   Procedure: POLYPECTOMY;  Surgeon: Corbin Ade, MD;  Location: [facility];  Service: Endoscopy;;   TOTAL KNEE ARTHROPLASTY Left 03/27/2021   Procedure: TOTAL KNEE ARTHROPLASTY;  Surgeon: Beverely Low, MD;  Location: [facility];  Service: Orthopedics;  Laterality: Left;   TOTAL KNEE ARTHROPLASTY Right 07/10/2021   Procedure: TOTAL KNEE ARTHROPLASTY;  Surgeon: Beverely Low, MD;  Location: [facility];  Service: Orthopedics;   Laterality: Right;   TUBAL LIGATION      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    Employer: DISABLED  Tobacco Use   Smoking status: Never    Passive exposure: Never   Smokeless tobacco: Never  Vaping Use   Vaping status: Never Used  Substance and Sexual Activity   Alcohol use: Not Currently   Drug use: Never   Sexual activity: Not Currently    Birth control/protection: Surgical  Other Topics Concern   Not on file  Social History Narrative   Not on file   Social Determinants of Health   Financial Resource Strain: Not on file  Food Insecurity: No Food Insecurity (09/30/2022)   Hunger Vital Sign    Worried About Running Out of Food in the Last Year: Never true    Ran Out of Food in the Last Year: Never true  Transportation Needs: No Transportation Needs (09/30/2022)   PRAPARE - Administrator, Civil Service (Medical): No    Lack of Transportation (Non-Medical): No  Physical Activity: Not on file  Stress: Not on file  Social Connections: Not on file    Family History  Problem Relation Age of Onset   Ovarian cancer Mother    Heart disease Mother    Anxiety disorder Mother    Cirrhosis Father        deceased age 28   Alcohol abuse Father    Anxiety disorder Sister    Dementia Maternal Grandfather    ADD / ADHD Son    Liver cancer Cousin        age 52, deceased   Drug abuse Cousin    Colon cancer Other        aunt, deceased age 5   Breast cancer Other        aunt, deceased age 12   Bipolar disorder Neg Hx    Depression Neg Hx    OCD Neg Hx    Paranoid behavior Neg Hx    Schizophrenia Neg Hx    Seizures Neg Hx    Sexual abuse Neg Hx    Physical abuse Neg Hx    Sleep apnea Neg Hx     Outpatient Encounter Medications  as of 02/01/2023  Medication Sig   Continuous Glucose Receiver (FREESTYLE LIBRE 3 READER) DEVI 1 Piece by Does not apply route once as needed for up to 1 dose.   Continuous Glucose Sensor (FREESTYLE LIBRE 3 SENSOR) MISC 1 Piece by Does not apply route every 14 (fourteen) days. Place 1 sensor on the skin every 14 days. Use to check glucose continuously  Semaglutide, 1 MG/DOSE, 4 MG/3ML SOPN Inject 1 mg as directed once a week.   ALPRAZolam (XANAX) 1 MG tablet Take 1 tablet (1 mg total) by mouth 3 (three) times daily as needed for anxiety.   ARIPiprazole (ABILIFY) 10 MG tablet Take 1 tablet (10 mg total) by mouth at bedtime.   Ascorbic Acid 500 MG CHEW Take 500 mg by mouth daily.   Blood Glucose Monitoring Suppl (FREESTYLE LITE) DEVI Use to measure glucose 4 times a day   CALCIUM-VITAMIN D-VITAMIN K PO Take 1 tablet by mouth daily. Chewable   cycloSPORINE (RESTASIS) 0.05 % ophthalmic emulsion Place 2 drops into both eyes 2 (two) times daily.    escitalopram (LEXAPRO) 20 MG tablet Take 1 tablet (20 mg total) by mouth daily.   fluticasone (FLONASE) 50 MCG/ACT nasal spray Place 2 sprays into the nose daily as needed for allergies.    gabapentin (NEURONTIN) 100 MG capsule Take 1 capsule (100 mg total) by mouth 2 (two) times daily.   glucose blood test strip Test 4 times a day, she has freestyle lite meter   Insulin Pen Needle (B-D ULTRAFINE III SHORT PEN) 31G X 8 MM MISC 1 each by Does not apply route as directed.   LINZESS 72 MCG capsule TAKE 1 CAPSULE DAILY BEFORE BREAKFAST   loratadine (CLARITIN) 10 MG tablet Take 10 mg by mouth daily.   Menthol, Topical Analgesic, (BENGAY EX) Apply 1 Application topically daily as needed (pain).   metFORMIN (GLUCOPHAGE-XR) 500 MG 24 hr tablet Take 1 tablet (500 mg total) by mouth daily with breakfast.   Multiple Vitamin (MULTIVITAMIN WITH MINERALS) TABS tablet Take 1 tablet by mouth daily. celebrate multivitamin 2 in 1   Multiple Vitamins-Minerals (IMMUNE SYSTEM  BOOSTER PO) Take 1 tablet by mouth daily.   NON FORMULARY Pt uses a cpap nightly   pantoprazole (PROTONIX) 40 MG tablet Take 1 tablet (40 mg total) by mouth 2 (two) times daily. (Patient taking differently: Take 40 mg by mouth daily before breakfast.)   polyethylene glycol (MIRALAX / GLYCOLAX) 17 g packet Take 17 g by mouth daily.   Probiotic Product (PROBIOTIC PO) Take 1 tablet by mouth daily.   rizatriptan (MAXALT-MLT) 10 MG disintegrating tablet Take 10 mg by mouth as needed for migraine. May repeat in 2 hours if needed   temazepam (RESTORIL) 30 MG capsule Take 1 capsule (30 mg total) by mouth at bedtime.   [DISCONTINUED] Continuous Blood Gluc Receiver (FREESTYLE LIBRE 2 READER) DEVI As directed   [DISCONTINUED] Continuous Blood Gluc Sensor (FREESTYLE LIBRE 2 SENSOR) MISC 1 Piece by Does not apply route every 14 (fourteen) days.   [DISCONTINUED] Semaglutide,0.25 or 0.5MG /DOS, 2 MG/3ML SOPN Inject 0.25 mg SQ weekly x 2 weeks, then increase to 0.5 mg SQ weekly thereafter if tolerated well.   No facility-administered encounter medications on file as of 02/01/2023.    ALLERGIES: Allergies  Allergen Reactions   Bactrim Itching, Nausea And Vomiting and Rash    Bactrim brand name. Redness.    VACCINATION STATUS: Immunization History  Administered Date(s) Administered   Moderna Sars-Covid-2 Vaccination 07/21/2019, 08/22/2019   Pneumococcal Polysaccharide-23 01/09/2020    Diabetes She presents for her follow-up diabetic visit. She has type 2 diabetes mellitus. Onset time: She was diagnosed at approximate age of 44 years. Her disease course has been improving (She underwent sleeve gastrectomy on January 08, 2020, and she has lost 14 pounds since her surgery.). There are no hypoglycemic associated symptoms. Pertinent negatives for hypoglycemia include no  confusion, headaches, pallor or seizures. Associated symptoms include fatigue. Pertinent negatives for diabetes include no chest pain, no  polydipsia, no polyphagia and no polyuria. There are no hypoglycemic complications. Symptoms are improving. Diabetic complications include heart disease. Risk factors for coronary artery disease include dyslipidemia, family history, diabetes mellitus, hypertension, obesity, sedentary lifestyle and post-menopausal. Current diabetic treatments: She is currently on Ozempic and metformin. Her weight is fluctuating minimally. She is following a generally unhealthy diet. When asked about meal planning, she reported none. She has not had a previous visit with a dietitian. She never participates in exercise. Her home blood glucose trend is decreasing steadily. Her breakfast blood glucose range is generally 140-180 mg/dl. Her lunch blood glucose range is generally 140-180 mg/dl. Her dinner blood glucose range is generally 140-180 mg/dl. Her bedtime blood glucose range is generally 140-180 mg/dl. Her overall blood glucose range is 140-180 mg/dl. [name] presents with her freestyle libre device showing average blood glucose of 145 mg per DL and point-of-care J8A improving to 6.9%.  AGP report shows 90% time range, 10% level 1 hyperglycemia.  She does not have hypoglycemia.    This patient underwent bariatric surgery twice-gastric banding in 2009, sleeve gastrectomy in 2021.  ) An ACE inhibitor/angiotensin II receptor blocker is being taken. Eye exam is current.  Hypertension This is a chronic problem. The current episode started more than 1 year ago. The problem is uncontrolled. Pertinent negatives include no chest pain, headaches, palpitations or shortness of breath. Risk factors for coronary artery disease include diabetes mellitus, obesity, sedentary lifestyle and post-menopausal state. Past treatments include ACE inhibitors.    Review of systems: Limited as above. Objective:    BP 138/82   Pulse 72   Ht 5\' 7"  (1.702 m)   Wt 268 lb 3.2 oz (121.7 kg)   BMI 42.01 kg/m   Wt Readings from Last 3 Encounters:   02/01/23 268 lb 3.2 oz (121.7 kg)  08/06/22 247 lb (112 kg)  07/09/22 216 lb 9.6 oz (98.2 kg)     Physical Exam- Limited  Constitutional:  Body mass index is 42.01 kg/m. , not in acute distress, normal state of mind   CMP     Component Value Date/Time   NA 137 08/06/2022 1233   NA 141 06/28/2022 0908   K 3.8 08/06/2022 1233   CL 101 08/06/2022 1233   CO2 27 08/06/2022 1233   GLUCOSE 104 (H) 08/06/2022 1233   BUN 23 08/06/2022 1233   BUN 20 06/28/2022 0908   CREATININE 0.64 08/06/2022 1233   CREATININE 0.74 03/23/2011 0400   CALCIUM 9.0 08/06/2022 1233   PROT 7.6 08/06/2022 1233   PROT 7.3 06/28/2022 0908   ALBUMIN 4.2 08/06/2022 1233   ALBUMIN 4.3 06/28/2022 0908   AST 23 08/06/2022 1233   ALT 19 08/06/2022 1233   ALKPHOS 80 08/06/2022 1233   BILITOT 0.6 08/06/2022 1233   BILITOT 0.4 06/28/2022 0908   GFRNONAA >60 08/06/2022 1233   GFRAA >60 01/01/2020 1356   Recent Results (from the past 2160 hour(s))  CBG     Status: None   Collection Time: 01/20/23 12:00 AM  Result Value Ref Range   Glucose 123   HgB A1c     Status: None   Collection Time: 02/01/23  3:58 PM  Result Value Ref Range   Hemoglobin A1C     HbA1c POC (<> result, manual entry)     HbA1c, POC (prediabetic range)     HbA1c, POC (controlled diabetic  range) 6.9 0.0 - 7.0 %   Lipid Panel     Component Value Date/Time   CHOL 197 06/28/2022 0908   TRIG 158 (H) 06/28/2022 0908   HDL 59 06/28/2022 0908   CHOLHDL 3.3 06/28/2022 0908   LDLCALC 111 (H) 06/28/2022 0908   LABVLDL 27 06/28/2022 0908     Assessment & Plan:   1. Uncontrolled type 2 diabetes mellitus with hyperglycemia (HCC)  - [name] has currently uncontrolled symptomatic type 2 DM since  62 years of age.  [name] presents with her freestyle libre device showing average blood glucose of 145 mg per DL and point-of-care A2Z improving to 6.9%.  AGP report shows 90% time range, 10% level 1 hyperglycemia.  She does not have  hypoglycemia.    This patient underwent bariatric surgery twice-gastric banding in 2009, sleeve gastrectomy in 2021.  - Recent labs reviewed. - I had a long discussion with her about the progressive nature of diabetes and the pathology behind its complications. -her diabetes is complicated by morbid obesity, sedentary life, hypertension and she remains at a high risk for more acute and chronic complications which include CAD, CVA, CKD, retinopathy, and neuropathy. These are all discussed in detail with her.  - I have counseled her on diet  and weight management  by adopting a carbohydrate restricted/protein rich diet. Patient is encouraged to switch to  unprocessed or minimally processed     complex starch and increased protein intake (animal or plant source), fruits, and vegetables. -  she is advised to stick to a routine mealtimes to eat 3 meals  a day and avoid unnecessary snacks ( to snack only to correct hypoglycemia).   In light of the fact that she underwent bariatric surgery twice,  she is approached for intensive therapeutic lifestyle change.  She would benefit the most from lifestyle medicine.  - she acknowledges that there is a room for improvement in her food and drink choices. - Suggestion is made for her to avoid simple carbohydrates  from her diet including Cakes, Sweet Desserts, Ice Cream, Soda (diet and regular), Sweet Tea, Candies, Chips, Cookies, Store Bought Juices, Alcohol , Artificial Sweeteners,  Coffee Creamer, and "Sugar-free" Products, Lemonade. This will help patient to have more stable blood glucose profile and potentially avoid unintended weight gain.  The following Lifestyle Medicine recommendations according to American College of Lifestyle Medicine  [facility]) were discussed and and offered to patient and she  agrees to start the journey:  A. Whole Foods, Plant-Based Nutrition comprising of fruits and vegetables, plant-based proteins, whole-grain carbohydrates was  discussed in detail with the patient.   A list for source of those nutrients were also provided to the patient.  Patient will use only water or unsweetened tea for hydration. B.  The need to stay away from risky substances including alcohol, smoking; obtaining 7 to 9 hours of restorative sleep, at least 150 minutes of moderate intensity exercise weekly, the importance of healthy social connections,  and stress management techniques were discussed. C.  A full color page of  Calorie density of various food groups per pound showing examples of each food groups was provided to the patient.   - I have approached her with the following individualized plan to manage  her diabetes and patient agrees:   Her point-of-care A1c is 6.9%.  She will continue to benefit from Ozempic.  I discussed and increase her dose to 1 mg subcutaneously weekly.  Side effects and precautions discussed  with her.  She is also advised to continue metformin 500 mg p.o. daily at breakfast.   She will continue to benefit from her CGM, advised to wear it at all times.  - Specific targets for  A1c;  LDL, HDL,  and Triglycerides were discussed with the patient.   2) Blood Pressure /Hypertension: -Her blood pressure is controlled to target.   she is advised to continue her current medications including lisinopril 5 mg p.o. daily with breakfast , lisinopril will be increased to  10 mg for next refill.  3) Lipids/Hyperlipidemia:     -Her recent lipid panel showed uncontrolled LDL at 111.  She will be considered for statin therapy after next visit.    4)  Weight/Diet: Her BMI is 42.01-  She is status post 60+ pounds of weight loss due to sleeve gastrectomy. See lifestyle medicine recommendations above.  5) Chronic Care/Health Maintenance:  -she  is on ACEI/ARB and  is encouraged to initiate and continue to follow up with Ophthalmology, Dentist,  Podiatrist at least yearly or according to recommendations, and advised to  stay away from  smoking. I have recommended yearly flu vaccine and pneumonia vaccine at least every 5 years; moderate intensity exercise for up to 150 minutes weekly; and  sleep for at least 7 hours a day.  Her screening ABI was normal in December 2022, This study will be repeated in 5 years-December 2026, or sooner if needed.  - she is  advised to maintain close follow up with Assunta Found, MD for primary care needs, as well as her other providers for optimal and coordinated care.   I spent  26  minutes in the care of the patient today including review of labs from CMP, Lipids, Thyroid Function, Hematology (current and previous including abstractions from other facilities); face-to-face time discussing  her blood glucose readings/logs, discussing hypoglycemia and hyperglycemia episodes and symptoms, medications doses, her options of short and long term treatment based on the latest standards of care / guidelines;  discussion about incorporating lifestyle medicine;  and documenting the encounter. Risk reduction counseling performed per USPSTF guidelines to reduce  obesity and cardiovascular risk factors.     Please refer to Patient Instructions for Blood Glucose Monitoring and Insulin/Medications Dosing Guide"  in media tab for additional information. Please  also refer to " Patient Self Inventory" in the Media  tab for reviewed elements of pertinent patient history.  [name] participated in the discussions, expressed understanding, and voiced agreement with the above plans.  All questions were answered to her satisfaction. she is encouraged to contact clinic should she have any questions or concerns prior to her return visit.       Follow up plan: - Return in about 4 months (around 06/03/2023) for F/U with Pre-visit Labs, Meter/CGM/Logs, A1c here.  Marquis Lunch, MD [facility] [address] [place], [place] [address] Phone: [phone]  Fax:  [phone]    02/01/2023, 6:57 PM  This note was partially dictated with voice recognition software. Similar sounding words can be transcribed inadequately or may not  be corrected upon review.

## 2023-02-01 NOTE — Patient Instructions (Signed)

## 2023-02-02 ENCOUNTER — Encounter (HOSPITAL_COMMUNITY): Payer: Self-pay | Admitting: Psychiatry

## 2023-02-02 ENCOUNTER — Ambulatory Visit (INDEPENDENT_AMBULATORY_CARE_PROVIDER_SITE_OTHER): Payer: Medicare Other | Admitting: Psychiatry

## 2023-02-02 DIAGNOSIS — F3162 Bipolar disorder, current episode mixed, moderate: Secondary | ICD-10-CM | POA: Diagnosis not present

## 2023-02-04 ENCOUNTER — Encounter (HOSPITAL_COMMUNITY): Payer: Self-pay | Admitting: Psychiatry
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
## 2023-02-07 DIAGNOSIS — H01002 Unspecified blepharitis right lower eyelid: Secondary | ICD-10-CM | POA: Diagnosis not present

## 2023-02-07 DIAGNOSIS — H01001 Unspecified blepharitis right upper eyelid: Secondary | ICD-10-CM | POA: Diagnosis not present

## 2023-02-07 DIAGNOSIS — H01004 Unspecified blepharitis left upper eyelid: Secondary | ICD-10-CM | POA: Diagnosis not present

## 2023-02-07 DIAGNOSIS — H16223 Keratoconjunctivitis sicca, not specified as Sjogren's, bilateral: Secondary | ICD-10-CM | POA: Diagnosis not present

## 2023-02-11 ENCOUNTER — Ambulatory Visit: Payer: Medicare Other | Attending: Internal Medicine | Admitting: Internal Medicine

## 2023-02-11 ENCOUNTER — Encounter: Payer: Self-pay | Admitting: Internal Medicine

## 2023-02-11 VITALS — BP 110/64 | HR 80 | Ht 67.0 in | Wt 267.8 lb

## 2023-02-11 DIAGNOSIS — R0602 Shortness of breath: Secondary | ICD-10-CM | POA: Diagnosis not present

## 2023-02-11 DIAGNOSIS — R002 Palpitations: Secondary | ICD-10-CM | POA: Insufficient documentation

## 2023-02-11 DIAGNOSIS — I10 Essential (primary) hypertension: Secondary | ICD-10-CM

## 2023-02-11 MED ORDER — DILTIAZEM HCL ER 60 MG PO CP12: 60.0000 mg | ORAL_CAPSULE | Freq: Three times a day (TID) | ORAL | 1 refills | Status: DC | PRN

## 2023-02-11 NOTE — Progress Notes (Signed)
Cardiology Office Note  Date: 02/11/2023   ID: [name], [name] [birth year], MRN [id number]  PCP:  Assunta Found, MD  Cardiologist:  Marjo Bicker, MD Electrophysiologist:  None   History of Present Illness: [name] is a 62 y.o. female known to have HTN, OSA on CPAP, DM 2, HLD was referred to cardiology clinic for evaluation of SOB.  Patient's husband passed away in 07-31-2022 of this year after which she started to experience intermittent palpitations and SOB mainly at rest.  No orthopnea, PND.  Uses CPAP at night.  She walked 1 block no issues, has been stable, no recent worsening in the ambulation distance.  She can perform her daily activities with no issues and she was also an active person previously.  She started to stress eat after the death of her husband and gained weight as well  She was also stressing out about things which her husband to care previously.  Denies having angina, dizziness, presyncope, syncope and palpitations.  She was started on metoprolol by PCP for the management of palpitations.   Past Medical History:  Diagnosis Date   Adenomatous polyp 2009   Anxiety    Arthritis    Bipolar 1 disorder (HCC)    Constipation    Depression    Diabetes mellitus    Diabetes mellitus, type II (HCC)    Diverticula of colon 2009   Generalized headaches    GERD (gastroesophageal reflux disease)    History of hiatal hernia    small   Hypertension    no meds   Migraine    Neuropathy    both feet   Obesity    Pelvic floor dysfunction    abnormal [facility]orectal [facility]ometry [facility] in 2009   Plantar fasciitis both feet   Sleep apnea    cpap setting of 3.5    Past Surgical History:  Procedure Laterality Date   ABDOMINAL HYSTERECTOMY     compete   CATARACT EXTRACTION W/PHACO Right 02/15/2017   Procedure: CATARACT EXTRACTION PHACO AND INTRAOCULAR LENS PLACEMENT (IOC);  Surgeon: Jethro Bolus, MD;  Location: [facility];  Service: Ophthalmology;  Laterality:  Right;  CDE: 4.18   CATARACT EXTRACTION W/PHACO Left 03/01/2017   Procedure: CATARACT EXTRACTION PHACO AND INTRAOCULAR LENS PLACEMENT (IOC);  Surgeon: Jethro Bolus, MD;  Location: [facility];  Service: Ophthalmology;  Laterality: Left;  CDE: 2.56   COLON RESECTION  03/30/2002   with end-colostomy and Hartmann's pouch   COLON SURGERY     complicated diverticulitis requiring sigmoid resection with colostomy and subsequent takedown   COLONOSCOPY  12/11/2007   Dr. Jena Gauss- marginal prep, normal rectum pancolonic diverticula, adenomatous polyp   COLONOSCOPY  08/28/2003    Wide open colonic anastomosis/Scattered diverticula noted throughout colon/ Small external hemorrhoids   COLONOSCOPY  04/2012   [facility]: hyperplastic polyps, diverticulosis, ileocolonic anastomosis.   COLONOSCOPY WITH PROPOFOL N/A 06/07/2016   Sigmoid diverticulosis, s/p prior segmental resection. 5 year surveillance. Personal history of polyps in past.   COLONOSCOPY WITH PROPOFOL N/A 12/23/2021   Procedure: COLONOSCOPY WITH PROPOFOL;  Surgeon: Corbin Ade, MD;  Location: [facility];  Service: Endoscopy;  Laterality: N/A;  2:45pm, asa 3, knows new arrival time per Mindy   COLOSTOMY CLOSURE  07/10/2002   ESOPHAGOGASTRODUODENOSCOPY N/A 01/08/2020   Procedure: UPPER GASTROINTESTINAL ENDOSCOPY;  Surgeon: Ovidio Kin, MD;  Location: [facility];  Service: General;  Laterality: N/A;   ESOPHAGOGASTRODUODENOSCOPY (EGD) WITH PROPOFOL N/A 08/23/2016   Procedure: ESOPHAGOGASTRODUODENOSCOPY (  EGD) WITH PROPOFOL;  Surgeon: Ovidio Kin, MD;  Location: [facility];  Service: General;  Laterality: N/A;   FLEXIBLE SIGMOIDOSCOPY  01/20/2012   RMR: incomplete/attempted colonoscopy. Inadequate prep precluded examination   HEEL SPUR SURGERY Left 09/11/2013   both have been done   KNEE ARTHROSCOPY  02/04/2004    left knee/partial medial meniscectomy.   KNEE SURGERY     3 arthroscopic  2 on left 1 on right   LAPAROSCOPIC GASTRIC BANDING  2009    LAPAROSCOPIC GASTRIC SLEEVE RESECTION N/A 01/08/2020   Procedure: LAPAROSCOPIC SLEEVE GASTRECTOMY;  Surgeon: Ovidio Kin, MD;  Location: [facility];  Service: General;  Laterality: N/A;   LAPAROSCOPIC SALPINGOOPHERECTOMY  03/30/2002   POLYPECTOMY  12/23/2021   Procedure: POLYPECTOMY;  Surgeon: Corbin Ade, MD;  [facility]: [facility];  Service: Endoscopy;;   TOTAL KNEE ARTHROPLASTY Left 03/27/2021   Procedure: TOTAL KNEE ARTHROPLASTY;  Surgeon: Beverely Low, MD;  Location: [facility];  Service: Orthopedics;  Laterality: Left;   TOTAL KNEE ARTHROPLASTY Right 07/10/2021   Procedure: TOTAL KNEE ARTHROPLASTY;  Surgeon: Beverely Low, MD;  Location: [facility];  Service: Orthopedics;  Laterality: Right;   TUBAL LIGATION      Current Outpatient Medications  Medication Sig Dispense Refill   ALPRAZolam (XANAX) 1 MG tablet Take 1 tablet (1 mg total) by mouth 3 (three) times daily as needed for anxiety. 270 tablet 1   ARIPiprazole (ABILIFY) 10 MG tablet Take 1 tablet (10 mg total) by mouth at bedtime. 90 tablet 2   Ascorbic Acid 500 MG CHEW Take 500 mg by mouth daily.     CALCIUM-VITAMIN D-VITAMIN K PO Take 1 tablet by mouth daily. Chewable     cycloSPORINE (RESTASIS) 0.05 % ophthalmic emulsion Place 2 drops into both eyes 2 (two) times daily.      escitalopram (LEXAPRO) 20 MG tablet Take 1 tablet (20 mg total) by mouth daily. 90 tablet 2   fluticasone (FLONASE) 50 MCG/ACT nasal spray Place 2 sprays into the nose daily as needed for allergies.      gabapentin (NEURONTIN) 100 MG capsule Take 1 capsule (100 mg total) by mouth 2 (two) times daily. 180 capsule 1   LINZESS 72 MCG capsule TAKE 1 CAPSULE DAILY BEFORE BREAKFAST 90 capsule 3   loratadine (CLARITIN) 10 MG tablet Take 10 mg by mouth daily.     Menthol, Topical Analgesic, (BENGAY EX) Apply 1 Application topically daily as needed (pain).     metFORMIN (GLUCOPHAGE-XR) 500 MG 24 hr tablet Take 1 tablet (500 mg total) by mouth daily with breakfast. 90  tablet 0   metoprolol succinate (TOPROL-XL) 25 MG 24 hr tablet Take 25 mg by mouth daily.     Multiple Vitamin (MULTIVITAMIN WITH MINERALS) TABS tablet Take 1 tablet by mouth daily. celebrate multivitamin 2 in 1     Multiple Vitamins-Minerals (IMMUNE SYSTEM BOOSTER PO) Take 1 tablet by mouth daily.     NON FORMULARY Pt uses a cpap nightly     omeprazole (PRILOSEC) 20 MG capsule Take 20 mg by mouth daily.     polyethylene glycol (MIRALAX / GLYCOLAX) 17 g packet Take 17 g by mouth daily.     Probiotic Product (PROBIOTIC PO) Take 1 tablet by mouth daily.     rizatriptan (MAXALT-MLT) 10 MG disintegrating tablet Take 10 mg by mouth as needed for migraine. May repeat in 2 hours if needed     Semaglutide, 1 MG/DOSE, 4 MG/3ML SOPN Inject 1 mg as directed once  a week. 9 mL 1   temazepam (RESTORIL) 30 MG capsule Take 1 capsule (30 mg total) by mouth at bedtime. 30 capsule 2   Blood Glucose Monitoring Suppl (FREESTYLE LITE) DEVI Use to measure glucose 4 times a day (Patient not taking: Reported on 02/02/2023) 1 each 0   Continuous Glucose Receiver (FREESTYLE LIBRE 3 READER) DEVI 1 Piece by Does not apply route once as needed for up to 1 dose. 1 each 0   Continuous Glucose Sensor (FREESTYLE LIBRE 3 SENSOR) MISC 1 Piece by Does not apply route every 14 (fourteen) days. Place 1 sensor on the skin every 14 days. Use to check glucose continuously 2 each 3   glucose blood test strip Test 4 times a day, she has freestyle lite meter (Patient not taking: Reported on 02/02/2023) 150 each 2   Insulin Pen Needle (B-D ULTRAFINE III SHORT PEN) 31G X 8 MM MISC 1 each by Does not apply route as directed. (Patient not taking: Reported on 02/02/2023) 100 each 3   No current facility-administered medications for this visit.   Allergies:  Bactrim   Social History: The patient  reports that she has never smoked. She has never been exposed to tobacco smoke. She has never used smokeless tobacco. She reports that she does not  currently use alcohol. She reports that she does not use drugs.   Family History: The patient's family history includes ADD / ADHD in her son; Alcohol abuse in her father; Anxiety disorder in her mother and sister; Breast cancer in an other family member; Cirrhosis in her father; Colon cancer in an other family member; Dementia in her brother and maternal grandfather; Drug abuse in her cousin; Heart disease in her mother; Liver cancer in her cousin; Ovarian cancer in her mother.   ROS:  Please see the history of present illness. Otherwise, complete review of systems is positive for none  All other systems are reviewed and negative.   Physical Exam: VS:  BP 110/64   Pulse 80   Ht 5\' 7"  (1.702 m)   Wt 267 lb 12.8 oz (121.5 kg)   SpO2 94%   BMI 41.94 kg/m , BMI Body mass index is 41.94 kg/m.  Wt Readings from Last 3 Encounters:  02/11/23 267 lb 12.8 oz (121.5 kg)  02/01/23 268 lb 3.2 oz (121.7 kg)  08/06/22 247 lb (112 kg)    General: Patient appears comfortable at rest. HEENT: Conjunctiva and lids normal, oropharynx clear with moist mucosa. Neck: Supple, no elevated JVP or carotid bruits, no thyromegaly. Lungs: Clear to auscultation, nonlabored breathing at rest. Cardiac: Regular rate and rhythm, no S3 or significant systolic murmur, no pericardial rub. Abdomen: Soft, nontender, no hepatomegaly, bowel sounds present, no guarding or rebound. Extremities: No pitting edema, distal pulses 2+. Skin: Warm and dry. Musculoskeletal: No kyphosis. Neuropsychiatric: Alert and oriented x3, affect grossly appropriate.  Recent Labwork: 08/06/2022: ALT 19; AST 23; BUN 23; Creatinine, Ser 0.64; Hemoglobin 15.3; Platelets 220; Potassium 3.8; Sodium 137     Component Value Date/Time   CHOL 197 06/28/2022 0908   TRIG 158 (H) 06/28/2022 0908   HDL 59 06/28/2022 0908   CHOLHDL 3.3 06/28/2022 0908   LDLCALC 111 (H) 06/28/2022 0908     Assessment and Plan:  SOB Palpitations Diabetes mellitus  type 2 OSA on CPAP   -Intermittent palpitations and SOB at rest started after the death of her husband in Aug 05, 2022.  They have been together for 36 years. She also started  stress eating resulting in weight gain and started to worry about things which her husband used to take care of it previously.  SOB mainly at rest, can walk 1 block (this is her baseline, no worsening), no orthopnea or PND.  No leg swelling.  Will obtain 2D echocardiogram to rule out any stress-induced cardiomyopathy. Okay to start low intensity exercise and if echo is normal, she will have no exercise restrictions.  Will switch metoprolol to diltiazem 60 mg every 8 hours as needed for palpitations. -Currently on semaglutide for DM 2 as well as metformin.  Follows with PCP.  Continue CPAP compliance.      Medication Adjustments/Labs and Tests Ordered: Current medicines are reviewed at length with the patient today.  Concerns regarding medicines are outlined above.    Disposition:  Follow up  pending results  Signed Danely Bayliss Verne Spurr, MD, 02/11/2023 9:18 AM    [facility] [address] [place], [place], [place] [address]

## 2023-02-11 NOTE — Patient Instructions (Addendum)
Medication Instructions:  Your physician has recommended you make the following change in your medication:  Stop taking Metoprolol Succinate Start taking Diltiazem 60 mg every 8 hours as needed for palpitations  Continue taking all other medications as prescribed  Labwork: None  Testing/Procedures: Your physician has requested that you have an echocardiogram. Echocardiography is a painless test that uses sound waves to create images of your heart. It provides your doctor with information about the size and shape of your heart and how well your heart's chambers and valves are working. This procedure takes approximately one hour. There are no restrictions for this procedure. Please do NOT wear cologne, perfume, aftershave, or lotions (deodorant is allowed). Please arrive 15 minutes prior to your appointment time.   Follow-Up: Your physician recommends that you schedule a follow-up appointment in: Pending Results  Any Other Special Instructions Will Be Listed Below (If Applicable). Thank you for choosing [facility]!      If you need a refill on your cardiac medications before your next appointment, please call your pharmacy.

## 2023-02-12 ENCOUNTER — Other Ambulatory Visit: Payer: Self-pay | Admitting: "Endocrinology

## 2023-02-17 DIAGNOSIS — Z23 Encounter for immunization: Secondary | ICD-10-CM | POA: Diagnosis not present

## 2023-02-18 ENCOUNTER — Other Ambulatory Visit (HOSPITAL_COMMUNITY): Payer: Self-pay | Admitting: Family Medicine

## 2023-02-18 DIAGNOSIS — Z1231 Encounter for screening mammogram for malignant neoplasm of breast: Secondary | ICD-10-CM

## 2023-02-22 ENCOUNTER — Other Ambulatory Visit (HOSPITAL_COMMUNITY): Payer: Self-pay | Admitting: Psychiatry

## 2023-02-22 MED ORDER — TEMAZEPAM 30 MG PO CAPS: 30.0000 mg | ORAL_CAPSULE | Freq: Every day | ORAL | 2 refills | Status: DC

## 2023-02-28 ENCOUNTER — Ambulatory Visit: Payer: Medicare Other | Attending: Internal Medicine

## 2023-02-28 DIAGNOSIS — R0602 Shortness of breath: Secondary | ICD-10-CM

## 2023-02-28 LAB — ECHOCARDIOGRAM COMPLETE
AR max vel: 2.88 cm2
AV Area VTI: 3.04 cm2
AV Area mean vel: 2.96 cm2
AV Mean grad: 3 mm[Hg]
AV Peak grad: 4.2 mm[Hg]
Ao pk vel: 1.02 m/s
Area-P 1/2: 3.08 cm2
Calc EF: 75.1 %
MV VTI: 1.84 cm2
S' Lateral: 3.2 cm
Single Plane A2C EF: 77.6 %
Single Plane A4C EF: 74.5 %

## 2023-03-01 ENCOUNTER — Telehealth: Payer: Self-pay

## 2023-03-01 NOTE — Telephone Encounter (Signed)
Left detailed message per DPR and sent mychart message

## 2023-03-01 NOTE — Telephone Encounter (Signed)
-----   Message from Vishnu P Mallipeddi sent at 03/01/2023  9:12 AM EDT ----- Normal pumping function of the heart, normal diastology and no valvular heart disease.  Overall normal echocardiogram.  Schedule follow-up as needed.

## 2023-03-08 ENCOUNTER — Telehealth (HOSPITAL_COMMUNITY): Payer: Medicare Other | Admitting: Psychiatry

## 2023-03-08 ENCOUNTER — Telehealth: Payer: Self-pay | Admitting: "Endocrinology

## 2023-03-08 NOTE — Telephone Encounter (Signed)
PA request for [facility] sent to PA team

## 2023-03-09 ENCOUNTER — Encounter (HOSPITAL_COMMUNITY): Payer: Self-pay | Admitting: Psychiatry

## 2023-03-09 ENCOUNTER — Telehealth (HOSPITAL_COMMUNITY): Payer: Medicare Other | Admitting: Psychiatry

## 2023-03-09 DIAGNOSIS — F3162 Bipolar disorder, current episode mixed, moderate: Secondary | ICD-10-CM

## 2023-03-09 MED ORDER — ESCITALOPRAM OXALATE 20 MG PO TABS: 20.0000 mg | ORAL_TABLET | Freq: Every day | ORAL | 2 refills | Status: DC

## 2023-03-09 MED ORDER — TEMAZEPAM 30 MG PO CAPS: 30.0000 mg | ORAL_CAPSULE | Freq: Every day | ORAL | 2 refills | Status: DC

## 2023-03-09 MED ORDER — ALPRAZOLAM 1 MG PO TABS: 1.0000 mg | ORAL_TABLET | Freq: Three times a day (TID) | ORAL | 1 refills | Status: DC | PRN

## 2023-03-09 MED ORDER — ARIPIPRAZOLE 10 MG PO TABS: 10.0000 mg | ORAL_TABLET | Freq: Every day | ORAL | 2 refills | Status: DC

## 2023-03-09 NOTE — Progress Notes (Signed)
Virtual Visit via Video Note  I connected with [name] on 03/09/23 at  1:20 PM EDT by a video enabled telemedicine application and verified that I am speaking with the correct person using two identifiers.  Location: Patient: home Provider: office   I discussed the limitations of evaluation and management by telemedicine and the availability of in person appointments. The patient expressed understanding and agreed to proceed.      I discussed the assessment and treatment plan with the patient. The patient was provided an opportunity to ask questions and all were answered. The patient agreed with the plan and demonstrated an understanding of the instructions.   The patient was advised to call back or seek an in-person evaluation if the symptoms worsen or if the condition fails to improve as anticipated.  I provided 15 minutes of non-face-to-face time during this encounter.   [name], MD  [facility] MD/PA/NP OP Progress Note  03/09/2023 1:45 PM [name]  MRN:  [id number]  Chief Complaint:  Chief Complaint  Patient presents with   Depression   Anxiety   Follow-up   HPI: This patient is a 62 year old widowed white female who lives alone in [place].  She has 2 sons who live outside the home.  The patient returns for follow-up after 3 months regarding her depression anxiety and mood swings.  She still is mourning the loss of her husband who died of a stroke 7 months ago.  Even though he was a difficult person she still misses the company and often feels lonely.  One of her sons is not talking to her right now which makes it more difficult.  She is very close to her sister and has various friends.  She continues to go to the gym.  She does feel the medications are helping her mood and anxiety and sleep.  She denies significant manic symptoms such as racing thoughts or irritability.  She denies severe depression or thoughts of self-harm. Visit Diagnosis:    ICD-10-CM    1. Bipolar 1 disorder, mixed, moderate (HCC)  F31.62       Past Psychiatric History: Long-term outpatient treatment  Past Medical History:  Past Medical History:  Diagnosis Date   Adenomatous polyp 2009   Anxiety    Arthritis    Bipolar 1 disorder (HCC)    Constipation    Depression    Diabetes mellitus    Diabetes mellitus, type II (HCC)    Diverticula of colon 2009   Generalized headaches    GERD (gastroesophageal reflux disease)    History of hiatal hernia    small   Hypertension    no meds   Migraine    Neuropathy    both feet   Obesity    Pelvic floor dysfunction    abnormal anorectal manometry at [facility] in 2009   Plantar fasciitis both feet   Sleep apnea    cpap setting of 3.5    Past Surgical History:  Procedure Laterality Date   ABDOMINAL HYSTERECTOMY     compete   CATARACT EXTRACTION W/PHACO Right 02/15/2017   Procedure: CATARACT EXTRACTION PHACO AND INTRAOCULAR LENS PLACEMENT (IOC);  Surgeon: Jethro Bolus, MD;  Location: [facility];  Service: Ophthalmology;  Laterality: Right;  CDE: 4.18   CATARACT EXTRACTION W/PHACO Left 03/01/2017   Procedure: CATARACT EXTRACTION PHACO AND INTRAOCULAR LENS PLACEMENT (IOC);  Surgeon: Jethro Bolus, MD;  Location: [facility];  Service: Ophthalmology;  Laterality: Left;  CDE: 2.56   COLON  RESECTION  03/30/2002   with end-colostomy and Hartmann's pouch   COLON SURGERY     complicated diverticulitis requiring sigmoid resection with colostomy and subsequent takedown   COLONOSCOPY  12/11/2007   Dr. Jena Gauss- marginal prep, normal rectum pancolonic diverticula, adenomatous polyp   COLONOSCOPY  08/28/2003    Wide open colonic anastomosis/Scattered diverticula noted throughout colon/ Small external hemorrhoids   COLONOSCOPY  04/2012   [facility]: hyperplastic polyps, diverticulosis, ileocolonic anastomosis.   COLONOSCOPY WITH PROPOFOL N/A 06/07/2016   Sigmoid diverticulosis, s/p prior segmental resection. 5 year surveillance. Personal history of  polyps in past.   COLONOSCOPY WITH PROPOFOL N/A 12/23/2021   Procedure: COLONOSCOPY WITH PROPOFOL;  Surgeon: Corbin Ade, MD;  Location: [facility];  Service: Endoscopy;  Laterality: N/A;  2:45pm, asa 3, knows new arrival time per Mindy   COLOSTOMY CLOSURE  07/10/2002   ESOPHAGOGASTRODUODENOSCOPY N/A 01/08/2020   Procedure: UPPER GASTROINTESTINAL ENDOSCOPY;  Surgeon: Ovidio Kin, MD;  Location: [facility];  Service: General;  Laterality: N/A;   ESOPHAGOGASTRODUODENOSCOPY (EGD) WITH PROPOFOL N/A 08/23/2016   Procedure: ESOPHAGOGASTRODUODENOSCOPY (EGD) WITH PROPOFOL;  Surgeon: Ovidio Kin, MD;  Location: [facility];  Service: General;  Laterality: N/A;   FLEXIBLE SIGMOIDOSCOPY  01/20/2012   [facility]: incomplete/attempted colonoscopy. Inadequate prep precluded examination   HEEL SPUR SURGERY Left 09/11/2013   both have been done   KNEE ARTHROSCOPY  02/04/2004    left knee/partial medial meniscectomy.   KNEE SURGERY     3 arthroscopic  2 on left 1 on right   LAPAROSCOPIC GASTRIC BANDING  2009   LAPAROSCOPIC GASTRIC SLEEVE RESECTION N/A 01/08/2020   Procedure: LAPAROSCOPIC SLEEVE GASTRECTOMY;  Surgeon: Ovidio Kin, MD;  Location: [facility];  Service: General;  Laterality: N/A;   LAPAROSCOPIC SALPINGOOPHERECTOMY  03/30/2002   POLYPECTOMY  12/23/2021   Procedure: POLYPECTOMY;  Surgeon: Corbin Ade, MD;  [facility];  Service: Endoscopy;;   TOTAL KNEE ARTHROPLASTY Left 03/27/2021   Procedure: TOTAL KNEE ARTHROPLASTY;  Surgeon: Beverely Low, MD;  Location: [facility];  Service: Orthopedics;  Laterality: Left;   TOTAL KNEE ARTHROPLASTY Right 07/10/2021   Procedure: TOTAL KNEE ARTHROPLASTY;  Surgeon: Beverely Low, MD;  Location: [facility];  Service: Orthopedics;  Laterality: Right;   TUBAL LIGATION      Family Psychiatric History: See below  Family History:  Family History  Problem Relation Age of Onset   Ovarian cancer Mother    Heart disease Mother    Anxiety disorder Mother     Cirrhosis Father        deceased age 72   Alcohol abuse Father    Anxiety disorder Sister    Dementia Brother    Dementia Maternal Grandfather    Liver cancer Cousin        age 92, deceased   Drug abuse Cousin    ADD / ADHD Son    Colon cancer Other        aunt, deceased age 51   Breast cancer Other        aunt, deceased age 36   Bipolar disorder Neg Hx    Depression Neg Hx    OCD Neg Hx    Paranoid behavior Neg Hx    Schizophrenia Neg Hx    Seizures Neg Hx    Sexual abuse Neg Hx    Physical abuse Neg Hx    Sleep apnea Neg Hx     Social History:  Social History   Socioeconomic History   Marital status:  Widowed    Spouse name: Not on file   Number of children: 2   Years of education: college   Highest education level: Not on file  Occupational History   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    Employer: DISABLED  Tobacco Use   Smoking status: Never    Passive exposure: Never   Smokeless tobacco: Never  Vaping Use   Vaping status: Never Used  Substance and Sexual Activity   Alcohol use: Not Currently   Drug use: Never   Sexual activity: Not Currently    Birth control/protection: Surgical  Other Topics Concern   Not on file  Social History Narrative   Not on file   Social Determinants of Health   Financial Resource Strain: Not on file  Food Insecurity: No Food Insecurity (09/30/2022)   Hunger Vital Sign    Worried About Running Out of Food in the Last Year: Never true    Ran Out of Food in the Last Year: Never true  Transportation Needs: No Transportation Needs (09/30/2022)   PRAPARE - Administrator, Civil Service (Medical): No    Lack of Transportation (Non-Medical): No  Physical Activity: Not on file  Stress: Not on file  Social Connections: Not on file    Allergies:  Allergies  Allergen Reactions   Bactrim Itching, Nausea And Vomiting and Rash    Bactrim brand name. Redness.    Metabolic Disorder Labs: Lab Results   Component Value Date   HGBA1C 6.9 02/01/2023   MPG 140 06/29/2021   MPG 139.85 03/17/2021   No results found for: "PROLACTIN" Lab Results  Component Value Date   CHOL 197 06/28/2022   TRIG 158 (H) 06/28/2022   HDL 59 06/28/2022   CHOLHDL 3.3 06/28/2022   LDLCALC 111 (H) 06/28/2022   LDLCALC 105 (H) 06/15/2021   Lab Results  Component Value Date   TSH 1.060 06/15/2021   TSH 1.310 08/13/2020    Therapeutic Level Labs: No results found for: "LITHIUM" No results found for: "VALPROATE" Lab Results  Component Value Date   CBMZ 7.5 09/19/2012   CBMZ 6.6 05/18/2011    Current Medications: Current Outpatient Medications  Medication Sig Dispense Refill   ALPRAZolam (XANAX) 1 MG tablet Take 1 tablet (1 mg total) by mouth 3 (three) times daily as needed for anxiety. 270 tablet 1   ARIPiprazole (ABILIFY) 10 MG tablet Take 1 tablet (10 mg total) by mouth at bedtime. 90 tablet 2   Ascorbic Acid 500 MG CHEW Take 500 mg by mouth daily.     Blood Glucose Monitoring Suppl (FREESTYLE LITE) DEVI Use to measure glucose 4 times a day (Patient not taking: Reported on 02/02/2023) 1 each 0   CALCIUM-VITAMIN D-VITAMIN K PO Take 1 tablet by mouth daily. Chewable     Continuous Glucose Receiver (FREESTYLE LIBRE 3 READER) DEVI 1 Piece by Does not apply route once as needed for up to 1 dose. 1 each 0   Continuous Glucose Sensor (FREESTYLE LIBRE 3 SENSOR) MISC 1 Piece by Does not apply route every 14 (fourteen) days. Place 1 sensor on the skin every 14 days. Use to check glucose continuously 2 each 3   cycloSPORINE (RESTASIS) 0.05 % ophthalmic emulsion Place 2 drops into both eyes 2 (two) times daily.      diltiazem (CARDIZEM SR) 60 MG 12 hr capsule Take 1 capsule (60 mg total) by mouth every 8 (eight) hours as needed (Palpitations). 90 capsule 1  escitalopram (LEXAPRO) 20 MG tablet Take 1 tablet (20 mg total) by mouth daily. 90 tablet 2   fluticasone (FLONASE) 50 MCG/ACT nasal spray Place 2 sprays into  the nose daily as needed for allergies.      gabapentin (NEURONTIN) 100 MG capsule Take 1 capsule (100 mg total) by mouth 2 (two) times daily. 180 capsule 1   glucose blood test strip Test 4 times a day, she has freestyle lite meter (Patient not taking: Reported on 02/02/2023) 150 each 2   Insulin Pen Needle (B-D ULTRAFINE III SHORT PEN) 31G X 8 MM MISC 1 each by Does not apply route as directed. (Patient not taking: Reported on 02/02/2023) 100 each 3   LINZESS 72 MCG capsule TAKE 1 CAPSULE DAILY BEFORE BREAKFAST 90 capsule 3   loratadine (CLARITIN) 10 MG tablet Take 10 mg by mouth daily.     Menthol, Topical Analgesic, (BENGAY EX) Apply 1 Application topically daily as needed (pain).     metFORMIN (GLUCOPHAGE-XR) 500 MG 24 hr tablet TAKE 1 TABLET DAILY WITH BREAKFAST 90 tablet 3   Multiple Vitamin (MULTIVITAMIN WITH MINERALS) TABS tablet Take 1 tablet by mouth daily. celebrate multivitamin 2 in 1     Multiple Vitamins-Minerals (IMMUNE SYSTEM BOOSTER PO) Take 1 tablet by mouth daily.     NON FORMULARY Pt uses a cpap nightly     omeprazole (PRILOSEC) 20 MG capsule Take 20 mg by mouth daily.     polyethylene glycol (MIRALAX / GLYCOLAX) 17 g packet Take 17 g by mouth daily.     Probiotic Product (PROBIOTIC PO) Take 1 tablet by mouth daily.     rizatriptan (MAXALT-MLT) 10 MG disintegrating tablet Take 10 mg by mouth as needed for migraine. May repeat in 2 hours if needed     Semaglutide, 1 MG/DOSE, 4 MG/3ML SOPN Inject 1 mg as directed once a week. 9 mL 1   temazepam (RESTORIL) 30 MG capsule Take 1 capsule (30 mg total) by mouth at bedtime. 30 capsule 2   No current facility-administered medications for this visit.     Musculoskeletal: Strength & Muscle Tone: within normal limits Gait & Station: A bit unsteady Patient leans: N/A  Psychiatric Specialty Exam: Review of Systems  Musculoskeletal:  Positive for back pain and gait problem.  Psychiatric/Behavioral:  Positive for dysphoric mood.    All other systems reviewed and are negative.   There were no vitals taken for this visit.There is no height or weight on file to calculate BMI.  General Appearance: Casual and Fairly Groomed  Eye Contact:  Good  Speech:  Clear and Coherent  Volume:  Normal  Mood:  Dysphoric  Affect:  Congruent  Thought Process:  Goal Directed  Orientation:  Full (Time, Place, and Person)  Thought Content: Rumination   Suicidal Thoughts:  No  Homicidal Thoughts:  No  Memory:  Immediate;   Good Recent;   Good Remote;   Fair  Judgement:  Good  Insight:  Good  Psychomotor Activity:  Decreased  Concentration:  Concentration: Good and Attention Span: Good  Recall:  Good  Fund of Knowledge: Good  Language: Good  Akathisia:  No  Handed:  Right  AIMS (if indicated): not done  Assets:  Communication Skills  ADL's:  Intact  Cognition: WNL  Sleep:  Good   Screenings: Insurance account manager from 02/02/2023 in [facility] at [place] Video Visit from 02/17/2022 in [facility] at [place]  Video Visit from 11/20/2021 in [facility] at [place] Video Visit from 08/12/2021 in [facility] at [place] [address] [facility] Video Visit from 05/14/2021 in [facility] at [facility] Total Score 6 0 0 0 1  PHQ-9 Total Score 20 -- -- -- --      Flowsheet Row Counselor from 02/02/2023 in [facility] at [place] ED from 08/06/2022 in [facility] at [facility] Video Visit from 02/17/2022 in [facility] at [place]  C-SSRS RISK CATEGORY No Risk No Risk No Risk        Assessment and Plan: This patient is a 62 year old female with a history of bipolar disorder.  She is grieving the loss of her husband but for the most part she is doing okay.  She will continue Lexapro 20 mg daily for  depression, Abilify 10 mg at bedtime for mood stabilization, Xanax 1 mg 3 times daily for anxiety and temazepam 30 mg at bedtime for sleep.  She will return to see me in 3 months  Collaboration of Care: Collaboration of Care: Referral or follow-up with counselor/therapist AEB patient will continue therapy with Florencia Reasons in our office  Patient/Guardian was advised Release of Information must be obtained prior to any record release in order to collaborate their care with an outside provider. Patient/Guardian was advised if they have not already done so to contact the registration department to sign all necessary forms in order for [place] to release information regarding their care.   Consent: Patient/Guardian gives verbal consent for treatment and assignment of benefits for services provided during this visit. Patient/Guardian expressed understanding and agreed to proceed.    [name], MD 03/09/2023, 1:45 PM

## 2023-03-21 ENCOUNTER — Telehealth: Payer: Self-pay | Admitting: Pharmacy Technician

## 2023-03-21 ENCOUNTER — Other Ambulatory Visit (HOSPITAL_COMMUNITY): Payer: Self-pay

## 2023-03-21 DIAGNOSIS — E1165 Type 2 diabetes mellitus with hyperglycemia: Secondary | ICD-10-CM

## 2023-03-21 NOTE — Telephone Encounter (Signed)
[facility] ENDO  Pharmacy Patient Advocate Encounter   Received notification from Pt Calls Messages that prior authorization for [facility] 3 is required/requested.   Insurance verification completed.   The patient is insured through [employer] .   Per test claim: Pt's Tricare requires a PA, but their Medicare requires for it to go through her Part B. It looks like her Medicare is Primary, so It's likely best that it goes through her part B. You may be able to send it back to [facility] with the Dx code on it and a note to bill Part B, instead of D.

## 2023-03-21 NOTE — Telephone Encounter (Signed)
Pt called following up on PA for Freestyle Libre 3 CGM system.

## 2023-03-21 NOTE — Telephone Encounter (Signed)
New Encounter created for follow up. For additional info see Pharmacy Prior Auth telephone encounter from 03/21/23.

## 2023-03-22 MED ORDER — FREESTYLE LIBRE 3 READER DEVI: 1.0000 | Freq: Once | 0 refills | Status: AC | PRN

## 2023-03-22 MED ORDER — FREESTYLE LIBRE 3 SENSOR MISC: 1.0000 | 3 refills | Status: DC

## 2023-03-22 NOTE — Telephone Encounter (Signed)
Pt called stating Rx's need to be sent to [facility] at fax # [phone]. Rx for [place] 3 CGM system sent.

## 2023-03-25 ENCOUNTER — Ambulatory Visit (HOSPITAL_COMMUNITY)
Admission: RE | Admit: 2023-03-25 | Discharge: 2023-03-25 | Disposition: A | Discharge status: Home / Self Care | Payer: Medicare Other | Source: Ambulatory Visit | Attending: Family Medicine | Admitting: Family Medicine

## 2023-03-25 DIAGNOSIS — Z1231 Encounter for screening mammogram for malignant neoplasm of breast: Secondary | ICD-10-CM

## 2023-04-14 ENCOUNTER — Ambulatory Visit (INDEPENDENT_AMBULATORY_CARE_PROVIDER_SITE_OTHER): Payer: Medicare Other | Admitting: Psychiatry

## 2023-04-14 DIAGNOSIS — Z634 Disappearance and death of family member: Secondary | ICD-10-CM

## 2023-04-14 DIAGNOSIS — F3162 Bipolar disorder, current episode mixed, moderate: Secondary | ICD-10-CM | POA: Diagnosis not present
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
## 2023-04-28 ENCOUNTER — Ambulatory Visit (HOSPITAL_COMMUNITY): Payer: Medicare Other | Admitting: Psychiatry

## 2023-04-28 ENCOUNTER — Encounter (HOSPITAL_COMMUNITY): Payer: Self-pay

## 2023-04-28 DIAGNOSIS — E1165 Type 2 diabetes mellitus with hyperglycemia: Secondary | ICD-10-CM | POA: Diagnosis not present

## 2023-04-28 DIAGNOSIS — Z634 Disappearance and death of family member: Secondary | ICD-10-CM | POA: Diagnosis not present

## 2023-04-28 DIAGNOSIS — F3162 Bipolar disorder, current episode mixed, moderate: Secondary | ICD-10-CM | POA: Diagnosis not present
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
## 2023-04-29 DIAGNOSIS — A493 Mycoplasma infection, unspecified site: Secondary | ICD-10-CM | POA: Diagnosis not present

## 2023-05-19 ENCOUNTER — Ambulatory Visit (HOSPITAL_COMMUNITY)
Admission: RE | Admit: 2023-05-19 | Discharge: 2023-05-19 | Disposition: A | Discharge status: Home / Self Care | Payer: Medicare Other | Source: Ambulatory Visit | Attending: Internal Medicine | Admitting: Internal Medicine

## 2023-05-19 ENCOUNTER — Other Ambulatory Visit (HOSPITAL_COMMUNITY): Payer: Self-pay | Admitting: Internal Medicine

## 2023-05-19 ENCOUNTER — Ambulatory Visit (HOSPITAL_COMMUNITY): Payer: Medicare Other | Admitting: Psychiatry

## 2023-05-19 DIAGNOSIS — I700 Atherosclerosis of aorta: Secondary | ICD-10-CM | POA: Diagnosis not present

## 2023-05-19 DIAGNOSIS — E1140 Type 2 diabetes mellitus with diabetic neuropathy, unspecified: Secondary | ICD-10-CM | POA: Diagnosis not present

## 2023-05-19 DIAGNOSIS — A493 Mycoplasma infection, unspecified site: Secondary | ICD-10-CM | POA: Diagnosis not present

## 2023-05-19 DIAGNOSIS — R053 Chronic cough: Secondary | ICD-10-CM

## 2023-05-19 DIAGNOSIS — I10 Essential (primary) hypertension: Secondary | ICD-10-CM | POA: Diagnosis not present

## 2023-05-19 DIAGNOSIS — E1165 Type 2 diabetes mellitus with hyperglycemia: Secondary | ICD-10-CM | POA: Diagnosis not present

## 2023-05-19 DIAGNOSIS — M159 Polyosteoarthritis, unspecified: Secondary | ICD-10-CM | POA: Diagnosis not present

## 2023-05-19 DIAGNOSIS — E559 Vitamin D deficiency, unspecified: Secondary | ICD-10-CM | POA: Diagnosis not present

## 2023-05-20 LAB — COMPREHENSIVE METABOLIC PANEL
ALT: 15 [IU]/L (ref 0–32)
AST: 17 [IU]/L (ref 0–40)
Albumin: 4.4 g/dL (ref 3.9–4.9)
Alkaline Phosphatase: 92 [IU]/L (ref 44–121)
BUN/Creatinine Ratio: 29 — ABNORMAL HIGH (ref 12–28)
BUN: 24 mg/dL (ref 8–27)
Bilirubin Total: 0.3 mg/dL (ref 0.0–1.2)
CO2: 25 mmol/L (ref 20–29)
Calcium: 9.6 mg/dL (ref 8.7–10.3)
Chloride: 99 mmol/L (ref 96–106)
Creatinine, Ser: 0.82 mg/dL (ref 0.57–1.00)
Globulin, Total: 2.8 g/dL (ref 1.5–4.5)
Glucose: 113 mg/dL — ABNORMAL HIGH (ref 70–99)
Potassium: 4.8 mmol/L (ref 3.5–5.2)
Sodium: 141 mmol/L (ref 134–144)
Total Protein: 7.2 g/dL (ref 6.0–8.5)
eGFR: 80 mL/min/{1.73_m2} (ref >59)

## 2023-05-20 LAB — VITAMIN B12: Vitamin B-12: 1034 pg/mL (ref 232–1245)

## 2023-05-20 LAB — LIPID PANEL
Chol/HDL Ratio: 3 {ratio} (ref 0.0–4.4)
Cholesterol, Total: 188 mg/dL (ref 100–199)
HDL: 62 mg/dL (ref >39)
LDL Chol Calc (NIH): 104 mg/dL — ABNORMAL HIGH (ref 0–99)
Triglycerides: 125 mg/dL (ref 0–149)
VLDL Cholesterol Cal: 22 mg/dL (ref 5–40)

## 2023-05-20 LAB — TSH: TSH: 1.93 u[IU]/mL (ref 0.450–4.500)

## 2023-05-20 LAB — VITAMIN D 25 HYDROXY (VIT D DEFICIENCY, FRACTURES): Vit D, 25-Hydroxy: 33.8 ng/mL (ref 30.0–100.0)

## 2023-05-20 LAB — T4, FREE: Free T4: 1.34 ng/dL (ref 0.82–1.77)

## 2023-06-02 ENCOUNTER — Ambulatory Visit (INDEPENDENT_AMBULATORY_CARE_PROVIDER_SITE_OTHER): Payer: Medicare Other | Admitting: Psychiatry

## 2023-06-02 DIAGNOSIS — Z634 Disappearance and death of family member: Secondary | ICD-10-CM

## 2023-06-02 DIAGNOSIS — F3162 Bipolar disorder, current episode mixed, moderate: Secondary | ICD-10-CM | POA: Diagnosis not present

## 2023-06-02 DIAGNOSIS — M5416 Radiculopathy, lumbar region: Secondary | ICD-10-CM | POA: Diagnosis not present
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
## 2023-06-06 ENCOUNTER — Encounter: Payer: Self-pay | Admitting: "Endocrinology

## 2023-06-06 ENCOUNTER — Ambulatory Visit (INDEPENDENT_AMBULATORY_CARE_PROVIDER_SITE_OTHER): Payer: Medicare Other | Admitting: "Endocrinology

## 2023-06-06 VITALS — BP 124/74 | HR 68 | Ht 67.0 in | Wt 263.2 lb

## 2023-06-06 DIAGNOSIS — I10 Essential (primary) hypertension: Secondary | ICD-10-CM

## 2023-06-06 DIAGNOSIS — Z7985 Long-term (current) use of injectable non-insulin antidiabetic drugs: Secondary | ICD-10-CM

## 2023-06-06 DIAGNOSIS — E559 Vitamin D deficiency, unspecified: Secondary | ICD-10-CM | POA: Diagnosis not present

## 2023-06-06 DIAGNOSIS — E1165 Type 2 diabetes mellitus with hyperglycemia: Secondary | ICD-10-CM

## 2023-06-06 DIAGNOSIS — E782 Mixed hyperlipidemia: Secondary | ICD-10-CM | POA: Diagnosis not present

## 2023-06-06 LAB — POCT GLYCOSYLATED HEMOGLOBIN (HGB A1C): HbA1c, POC (controlled diabetic range): 7.2 % — AB (ref 0.0–7.0)

## 2023-06-06 MED ORDER — SEMAGLUTIDE (2 MG/DOSE) 8 MG/3ML ~~LOC~~ SOPN: 2.0000 mg | PEN_INJECTOR | SUBCUTANEOUS | 1 refills | Status: DC

## 2023-06-06 NOTE — Patient Instructions (Signed)

## 2023-06-06 NOTE — Progress Notes (Signed)
06/06/2023, 1:24 PM                                     Endocrinology follow-up note   Subjective:    Patient ID: [name], female    DOB: 1960/11/30.  [name] is being seen in follow up after she was seen in consultation for management of currently uncontrolled symptomatic diabetes requested by  Assunta Found, MD.   Past Medical History:  Diagnosis Date   Adenomatous polyp 2009   Anxiety    Arthritis    Bipolar 1 disorder (HCC)    Constipation    Depression    Diabetes mellitus    Diabetes mellitus, type II (HCC)    Diverticula of colon 2009   Generalized headaches    GERD (gastroesophageal reflux disease)    History of hiatal hernia    small   Hypertension    no meds   Migraine    Neuropathy    both feet   Obesity    Pelvic floor dysfunction    abnormal anorectal manometry at [facility] in 2009   Plantar fasciitis both feet   Sleep apnea    cpap setting of 3.5    Past Surgical History:  Procedure Laterality Date   ABDOMINAL HYSTERECTOMY     compete   CATARACT EXTRACTION W/PHACO Right 02/15/2017   Procedure: CATARACT EXTRACTION PHACO AND INTRAOCULAR LENS PLACEMENT (IOC);  Surgeon: Jethro Bolus, MD;  Location: [facility];  Service: Ophthalmology;  Laterality: Right;  CDE: 4.18   CATARACT EXTRACTION W/PHACO Left 03/01/2017   Procedure: CATARACT EXTRACTION PHACO AND INTRAOCULAR LENS PLACEMENT (IOC);  Surgeon: Jethro Bolus, MD;  Location: [facility];  Service: Ophthalmology;  Laterality: Left;  CDE: 2.56   COLON RESECTION  03/30/2002   with end-colostomy and Hartmann's pouch   COLON SURGERY     complicated diverticulitis requiring sigmoid resection with colostomy and subsequent takedown   COLONOSCOPY  12/11/2007   Dr. Jena Gauss- marginal prep, normal rectum pancolonic diverticula, adenomatous polyp   COLONOSCOPY  08/28/2003    Wide open colonic anastomosis/Scattered diverticula noted throughout colon/ Small external hemorrhoids    COLONOSCOPY  04/2012   [facility]: hyperplastic polyps, diverticulosis, ileocolonic anastomosis.   COLONOSCOPY WITH PROPOFOL N/A 06/07/2016   Sigmoid diverticulosis, s/p prior segmental resection. 5 year surveillance. Personal history of polyps in past.   COLONOSCOPY WITH PROPOFOL N/A 12/23/2021   Procedure: COLONOSCOPY WITH PROPOFOL;  Surgeon: Corbin Ade, MD;  [facility];  Service: Endoscopy;  Laterality: N/A;  2:45pm, asa 3, knows new arrival time per Mindy   COLOSTOMY CLOSURE  07/10/2002   ESOPHAGOGASTRODUODENOSCOPY N/A 01/08/2020   Procedure: UPPER GASTROINTESTINAL ENDOSCOPY;  Surgeon: Ovidio Kin, MD;  Location: [facility];  Service: General;  Laterality: N/A;   ESOPHAGOGASTRODUODENOSCOPY (EGD) WITH PROPOFOL N/A 08/23/2016   Procedure: ESOPHAGOGASTRODUODENOSCOPY (EGD) WITH PROPOFOL;  Surgeon: Ovidio Kin, MD;  Location: [facility];  Service: General;  Laterality: N/A;   FLEXIBLE SIGMOIDOSCOPY  01/20/2012   [facility]: incomplete/attempted colonoscopy. Inadequate prep precluded examination   HEEL SPUR SURGERY Left 09/11/2013   both have been done   KNEE ARTHROSCOPY  02/04/2004    left knee/partial medial meniscectomy.   KNEE SURGERY     3 arthroscopic  2 on left 1 on right   LAPAROSCOPIC GASTRIC BANDING  2009   LAPAROSCOPIC GASTRIC SLEEVE RESECTION N/A 01/08/2020  Procedure: LAPAROSCOPIC SLEEVE GASTRECTOMY;  Surgeon: Ovidio Kin, MD;  Location: [facility];  Service: General;  Laterality: N/A;   LAPAROSCOPIC SALPINGOOPHERECTOMY  03/30/2002   POLYPECTOMY  12/23/2021   Procedure: POLYPECTOMY;  Surgeon: Corbin Ade, MD;  [facility];  Service: Endoscopy;;   TOTAL KNEE ARTHROPLASTY Left 03/27/2021   Procedure: TOTAL KNEE ARTHROPLASTY;  Surgeon: Beverely Low, MD;  Location: [facility];  Service: Orthopedics;  Laterality: Left;   TOTAL KNEE ARTHROPLASTY Right 07/10/2021   Procedure: TOTAL KNEE ARTHROPLASTY;  Surgeon: Beverely Low, MD;  Location: [facility];  Service: Orthopedics;   Laterality: Right;   TUBAL LIGATION      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    Employer: DISABLED  Tobacco Use   Smoking status: Never    Passive exposure: Never   Smokeless tobacco: Never  Vaping Use   Vaping status: Never Used  Substance and Sexual Activity   Alcohol use: Not Currently   Drug use: Never   Sexual activity: Not Currently    Birth control/protection: Surgical  Other Topics Concern   Not on file  Social History Narrative   Not on file   Social Drivers of Health   Financial Resource Strain: Not on file  Food Insecurity: No Food Insecurity (09/30/2022)   Hunger Vital Sign    Worried About Running Out of Food in the Last Year: Never true    Ran Out of Food in the Last Year: Never true  Transportation Needs: No Transportation Needs (09/30/2022)   PRAPARE - Administrator, Civil Service (Medical): No    Lack of Transportation (Non-Medical): No  Physical Activity: Not on file  Stress: Not on file  Social Connections: Not on file    Family History  Problem Relation Age of Onset   Ovarian cancer Mother    Heart disease Mother    Anxiety disorder Mother    Cirrhosis Father        deceased age 40   Alcohol abuse Father    Anxiety disorder Sister    Dementia Brother    Dementia Maternal Grandfather    Liver cancer Cousin        age 77, deceased   Drug abuse Cousin    ADD / ADHD Son    Colon cancer Other        aunt, deceased age 67   Breast cancer Other        aunt, deceased age 47   Bipolar disorder Neg Hx    Depression Neg Hx    OCD Neg Hx    Paranoid behavior Neg Hx    Schizophrenia Neg Hx    Seizures Neg Hx    Sexual abuse Neg Hx    Physical abuse Neg Hx    Sleep apnea Neg Hx     Outpatient  Encounter Medications as of 06/06/2023  Medication Sig   Semaglutide, 2 MG/DOSE, 8 MG/3ML SOPN Inject 2 mg as directed once a week.   ALPRAZolam (XANAX) 1 MG tablet Take 1 tablet (1 mg total) by mouth 3 (three) times daily as needed for anxiety.   ARIPiprazole (ABILIFY) 10 MG tablet Take 1 tablet (10 mg total) by mouth at bedtime.  Ascorbic Acid 500 MG CHEW Take 500 mg by mouth daily.   Blood Glucose Monitoring Suppl (FREESTYLE LITE) DEVI Use to measure glucose 4 times a day (Patient not taking: Reported on 02/02/2023)   CALCIUM-VITAMIN D-VITAMIN K PO Take 1 tablet by mouth daily. Chewable   Continuous Glucose Receiver (FREESTYLE LIBRE 3 READER) DEVI 1 Piece by Does not apply route once as needed for up to 1 dose.   Continuous Glucose Sensor (FREESTYLE LIBRE 3 SENSOR) MISC 1 Piece by Does not apply route every 14 (fourteen) days. Place 1 sensor on the skin every 14 days. Use to check glucose continuously   cycloSPORINE (RESTASIS) 0.05 % ophthalmic emulsion Place 2 drops into both eyes 2 (two) times daily.    diltiazem (CARDIZEM SR) 60 MG 12 hr capsule Take 1 capsule (60 mg total) by mouth every 8 (eight) hours as needed (Palpitations).   escitalopram (LEXAPRO) 20 MG tablet Take 1 tablet (20 mg total) by mouth daily.   fluticasone (FLONASE) 50 MCG/ACT nasal spray Place 2 sprays into the nose daily as needed for allergies.    gabapentin (NEURONTIN) 100 MG capsule Take 1 capsule (100 mg total) by mouth 2 (two) times daily.   glucose blood test strip Test 4 times a day, she has freestyle lite meter (Patient not taking: Reported on 02/02/2023)   Insulin Pen Needle (B-D ULTRAFINE III SHORT PEN) 31G X 8 MM MISC 1 each by Does not apply route as directed. (Patient not taking: Reported on 02/02/2023)   LINZESS 72 MCG capsule TAKE 1 CAPSULE DAILY BEFORE BREAKFAST   loratadine (CLARITIN) 10 MG tablet Take 10 mg by mouth daily.   Menthol, Topical Analgesic, (BENGAY EX) Apply 1 Application topically daily as  needed (pain).   metFORMIN (GLUCOPHAGE-XR) 500 MG 24 hr tablet TAKE 1 TABLET DAILY WITH BREAKFAST   Multiple Vitamin (MULTIVITAMIN WITH MINERALS) TABS tablet Take 1 tablet by mouth daily. celebrate multivitamin 2 in 1   Multiple Vitamins-Minerals (IMMUNE SYSTEM BOOSTER PO) Take 1 tablet by mouth daily.   NON FORMULARY Pt uses a cpap nightly   omeprazole (PRILOSEC) 20 MG capsule Take 20 mg by mouth daily.   polyethylene glycol (MIRALAX / GLYCOLAX) 17 g packet Take 17 g by mouth daily.   Probiotic Product (PROBIOTIC PO) Take 1 tablet by mouth daily.   rizatriptan (MAXALT-MLT) 10 MG disintegrating tablet Take 10 mg by mouth as needed for migraine. May repeat in 2 hours if needed   temazepam (RESTORIL) 30 MG capsule Take 1 capsule (30 mg total) by mouth at bedtime.   [DISCONTINUED] Semaglutide, 1 MG/DOSE, 4 MG/3ML SOPN Inject 1 mg as directed once a week.   No facility-administered encounter medications on file as of 06/06/2023.    ALLERGIES: Allergies  Allergen Reactions   Bactrim Itching, Nausea And Vomiting and Rash    Bactrim brand name. Redness.    VACCINATION STATUS: Immunization History  Administered Date(s) Administered   Moderna Sars-Covid-2 Vaccination 07/21/2019, 08/22/2019   Pneumococcal Polysaccharide-23 01/09/2020    Diabetes She presents for her follow-up diabetic visit. She has type 2 diabetes mellitus. Onset time: She was diagnosed at approximate age of 44 years. Her disease course has been improving (She underwent sleeve gastrectomy on January 08, 2020, and she has lost 14 pounds since her surgery.). There are no hypoglycemic associated symptoms. Pertinent negatives for hypoglycemia include no confusion, headaches, pallor or seizures. Associated symptoms include fatigue. Pertinent negatives for diabetes include no chest pain, no polydipsia, no polyphagia and no  polyuria. There are no hypoglycemic complications. Symptoms are improving. Diabetic complications include heart  disease. Risk factors for coronary artery disease include dyslipidemia, family history, diabetes mellitus, hypertension, obesity, sedentary lifestyle and post-menopausal. Current diabetic treatments: She is currently on Ozempic and metformin. Her weight is decreasing steadily. She is following a generally unhealthy diet. When asked about meal planning, she reported none. She has not had a previous visit with a dietitian. She never participates in exercise. Her home blood glucose trend is increasing steadily. Her breakfast blood glucose range is generally 140-180 mg/dl. Her lunch blood glucose range is generally 140-180 mg/dl. Her dinner blood glucose range is generally 140-180 mg/dl. Her bedtime blood glucose range is generally 140-180 mg/dl. Her overall blood glucose range is 140-180 mg/dl. [name] presents with her freestyle libre device showing average blood glucose of 142 mg per DL for the last 14 days.  Her AGP report shows 85% time in range, 10% level 1 hyperglycemia, 5% level 2 hyperglycemia.  She has no hypoglycemia.  Her point-of-care A1c is 7.2% increasing from 6.9% during her last visit.     This patient underwent bariatric surgery twice-gastric banding in 2009, sleeve gastrectomy in 2021.  ) An ACE inhibitor/angiotensin II receptor blocker is being taken. Eye exam is current.  Hypertension This is a chronic problem. The current episode started more than 1 year ago. The problem is uncontrolled. Pertinent negatives include no chest pain, headaches, palpitations or shortness of breath. Risk factors for coronary artery disease include diabetes mellitus, obesity, sedentary lifestyle and post-menopausal state. Past treatments include ACE inhibitors.    Review of systems: Limited as above. Objective:    BP 124/74   Pulse 68   Ht 5\' 7"  (1.702 m)   Wt 263 lb 3.2 oz (119.4 kg)   BMI 41.22 kg/m   Wt Readings from Last 3 Encounters:  06/06/23 263 lb 3.2 oz (119.4 kg)  02/11/23 267 lb 12.8 oz  (121.5 kg)  02/01/23 268 lb 3.2 oz (121.7 kg)     Physical Exam- Limited  Constitutional:  Body mass index is 41.22 kg/m. , not in acute distress, normal state of mind   CMP     Component Value Date/Time   NA 141 05/19/2023 1008   K 4.8 05/19/2023 1008   CL 99 05/19/2023 1008   CO2 25 05/19/2023 1008   GLUCOSE 113 (H) 05/19/2023 1008   GLUCOSE 104 (H) 08/06/2022 1233   BUN 24 05/19/2023 1008   CREATININE 0.82 05/19/2023 1008   CREATININE 0.74 03/23/2011 0400   CALCIUM 9.6 05/19/2023 1008   PROT 7.2 05/19/2023 1008   ALBUMIN 4.4 05/19/2023 1008   AST 17 05/19/2023 1008   ALT 15 05/19/2023 1008   ALKPHOS 92 05/19/2023 1008   BILITOT 0.3 05/19/2023 1008   GFRNONAA >60 08/06/2022 1233   GFRAA >60 01/01/2020 1356   Recent Results (from the past 2160 hours)  Comprehensive metabolic panel     Status: Abnormal   Collection Time: 05/19/23 10:08 AM  Result Value Ref Range   Glucose 113 (H) 70 - 99 mg/dL   BUN 24 8 - 27 mg/dL   Creatinine, Ser 1.61 0.57 - 1.00 mg/dL   eGFR 80 >09 UE/AVW/0.98   BUN/Creatinine Ratio 29 (H) 12 - 28   Sodium 141 134 - 144 mmol/L   Potassium 4.8 3.5 - 5.2 mmol/L   Chloride 99 96 - 106 mmol/L   CO2 25 20 - 29 mmol/L   Calcium 9.6 8.7 - 10.3 mg/dL  Total Protein 7.2 6.0 - 8.5 g/dL   Albumin 4.4 3.9 - 4.9 g/dL   Globulin, Total 2.8 1.5 - 4.5 g/dL   Bilirubin Total 0.3 0.0 - 1.2 mg/dL   Alkaline Phosphatase 92 44 - 121 IU/L   AST 17 0 - 40 IU/L   ALT 15 0 - 32 IU/L  Lipid panel     Status: Abnormal   Collection Time: 05/19/23 10:08 AM  Result Value Ref Range   Cholesterol, Total 188 100 - 199 mg/dL   Triglycerides 161 0 - 149 mg/dL   HDL 62 >09 mg/dL   VLDL Cholesterol Cal 22 5 - 40 mg/dL   LDL Chol Calc (NIH) 604 (H) 0 - 99 mg/dL   Chol/HDL Ratio 3.0 0.0 - 4.4 ratio    Comment:                                   T. Chol/HDL Ratio                                             Men  Women                               1/2 Avg.Risk  3.4     3.3                                   Avg.Risk  5.0    4.4                                2X Avg.Risk  9.6    7.1                                3X Avg.Risk 23.4   11.0   TSH     Status: None   Collection Time: 05/19/23 10:08 AM  Result Value Ref Range   TSH 1.930 0.450 - 4.500 uIU/mL  T4, free     Status: None   Collection Time: 05/19/23 10:08 AM  Result Value Ref Range   Free T4 1.34 0.82 - 1.77 ng/dL  Vitamin V40     Status: None   Collection Time: 05/19/23 10:08 AM  Result Value Ref Range   Vitamin B-12 1,034 232 - 1,245 pg/mL  VITAMIN D 25 Hydroxy (Vit-D Deficiency, Fractures)     Status: None   Collection Time: 05/19/23 10:08 AM  Result Value Ref Range   Vit D, 25-Hydroxy 33.8 30.0 - 100.0 ng/mL    Comment: Vitamin D deficiency has been defined by the Institute of Medicine and an Endocrine Society practice guideline as a level of serum 25-OH vitamin D less than 20 ng/mL (1,2). The Endocrine Society went on to further define vitamin D insufficiency as a level between 21 and 29 ng/mL (2). 1. IOM (Institute of Medicine). 2010. Dietary reference    intakes for calcium and D. [place]: The    [employer]. 2. Holick MF, Binkley , Bischoff-Ferrari HA, et al.    Evaluation, treatment, and prevention of vitamin D  deficiency: an Endocrine Society clinical practice    guideline. JCEM. 2011 Jul; 96(7):1911-30.   HgB A1c     Status: Abnormal   Collection Time: 06/06/23  1:14 PM  Result Value Ref Range   Hemoglobin A1C     HbA1c POC (<> result, manual entry)     HbA1c, POC (prediabetic range)     HbA1c, POC (controlled diabetic range) 7.2 (A) 0.0 - 7.0 %   Lipid Panel     Component Value Date/Time   CHOL 188 05/19/2023 1008   TRIG 125 05/19/2023 1008   HDL 62 05/19/2023 1008   CHOLHDL 3.0 05/19/2023 1008   LDLCALC 104 (H) 05/19/2023 1008   LABVLDL 22 05/19/2023 1008     Assessment & Plan:   1. Uncontrolled type 2 diabetes mellitus with  hyperglycemia (HCC)  - [name] has currently uncontrolled symptomatic type 2 DM since  63 years of age.  [name] presents with her freestyle libre device showing average blood glucose of 142 mg per DL for the last 14 days.  Her AGP report shows 85% time in range, 10% level 1 hyperglycemia, 5% level 2 hyperglycemia.  She has no hypoglycemia.  Her point-of-care A1c is 7.2% increasing from 6.9% during her last visit.     This patient underwent bariatric surgery twice-gastric banding in 2009, sleeve gastrectomy in 2021.  - Recent labs reviewed. - I had a long discussion with her about the progressive nature of diabetes and the pathology behind its complications. -her diabetes is complicated by morbid obesity, sedentary life, hypertension and she remains at a high risk for more acute and chronic complications which include CAD, CVA, CKD, retinopathy, and neuropathy. These are all discussed in detail with her.  - I have counseled her on diet  and weight management  by adopting a carbohydrate restricted/protein rich diet. Patient is encouraged to switch to  unprocessed or minimally processed     complex starch and increased protein intake (animal or plant source), fruits, and vegetables. -  she is advised to stick to a routine mealtimes to eat 3 meals  a day and avoid unnecessary snacks ( to snack only to correct hypoglycemia).   In light of the fact that she underwent bariatric surgery twice,  she is approached for intensive therapeutic lifestyle change.  She would benefit the most from lifestyle medicine.  - she acknowledges that there is a room for improvement in her food and drink choices. - Suggestion is made for her to avoid simple carbohydrates  from her diet including Cakes, Sweet Desserts, Ice Cream, Soda (diet and regular), Sweet Tea, Candies, Chips, Cookies, Store Bought Juices, Alcohol , Artificial Sweeteners,  Coffee Creamer, and "Sugar-free" Products, Lemonade. This will help  patient to have more stable blood glucose profile and potentially avoid unintended weight gain.  The following Lifestyle Medicine recommendations according to American College of Lifestyle Medicine  [facility]) were discussed and and offered to patient and she  agrees to start the journey:  A. Whole Foods, Plant-Based Nutrition comprising of fruits and vegetables, plant-based proteins, whole-grain carbohydrates was discussed in detail with the patient.   A list for source of those nutrients were also provided to the patient.  Patient will use only water or unsweetened tea for hydration. B.  The need to stay away from risky substances including alcohol, smoking; obtaining 7 to 9 hours of restorative sleep, at least 150 minutes of moderate intensity exercise weekly, the importance of healthy social connections,  and  stress management techniques were discussed. C.  A full color page of  Calorie density of various food groups per pound showing examples of each food groups was provided to the patient.   - I have approached her with the following individualized plan to manage  her diabetes and patient agrees:   Her point-of-care A1c is 7.2% increasing from 6.9%.  She will continue to benefit from Ozempic.  I discussed and increased her Ozempic to 2 mg subcutaneously weekly.   Side effects and precautions discussed with her.  She is also advised to continue metformin 500 mg p.o. daily at breakfast.   She will continue to benefit from her CGM, advised to wear it at all times.  - Specific targets for  A1c;  LDL, HDL,  and Triglycerides were discussed with the patient.   2) Blood Pressure /Hypertension: Her blood pressure is controlled to target.   she is advised to continue her current medications including lisinopril 5 mg p.o. daily with breakfast , lisinopril will be increased to  10 mg for next refill.  3) Lipids/Hyperlipidemia:     -Her recent lipid panel showed improved LDL at 104 from 111.  She is not  on statins, will be considered on subsequent visit.    4)  Weight/Diet: Her BMI is 41.22-  She is status post 65+ pounds of weight loss due to sleeve gastrectomy.  She is still a good candidate for modest weight loss. See lifestyle medicine recommendations above.  5) Chronic Care/Health Maintenance:  -she  is on ACEI/ARB and  is encouraged to initiate and continue to follow up with Ophthalmology, Dentist,  Podiatrist at least yearly or according to recommendations, and advised to  stay away from smoking. I have recommended yearly flu vaccine and pneumonia vaccine at least every 5 years; moderate intensity exercise for up to 150 minutes weekly; and  sleep for at least 7 hours a day.  Her screening ABI was normal in December 2022, This study will be repeated in 5 years-December 2026, or sooner if needed.  - she is  advised to maintain close follow up with Assunta Found, MD for primary care needs, as well as her other providers for optimal and coordinated care.   I spent  26  minutes in the care of the patient today including review of labs from CMP, Lipids, Thyroid Function, Hematology (current and previous including abstractions from other facilities); face-to-face time discussing  her blood glucose readings/logs, discussing hypoglycemia and hyperglycemia episodes and symptoms, medications doses, her options of short and long term treatment based on the latest standards of care / guidelines;  discussion about incorporating lifestyle medicine;  and documenting the encounter. Risk reduction counseling performed per USPSTF guidelines to reduce  obesity and cardiovascular risk factors.     Please refer to Patient Instructions for Blood Glucose Monitoring and Insulin/Medications Dosing Guide"  in media tab for additional information. Please  also refer to " Patient Self Inventory" in the Media  tab for reviewed elements of pertinent patient history.  [name] participated in the discussions,  expressed understanding, and voiced agreement with the above plans.  All questions were answered to her satisfaction. she is encouraged to contact clinic should she have any questions or concerns prior to her return visit.   Follow up plan: - Return in about 4 months (around 10/04/2023) for Bring Meter/CGM Device/Logs- A1c in Office.  Marquis Lunch, MD [facility] [address] [place], [place]  [address] Phone: [phone]  Fax: [phone]    06/06/2023, 1:24 PM  This note was partially dictated with voice recognition software. Similar sounding words can be transcribed inadequately or may not  be corrected upon review.

## 2023-06-09 ENCOUNTER — Encounter (HOSPITAL_COMMUNITY): Payer: Self-pay | Admitting: Psychiatry

## 2023-06-09 ENCOUNTER — Telehealth (HOSPITAL_COMMUNITY): Payer: Medicare Other | Admitting: Psychiatry

## 2023-06-09 DIAGNOSIS — F3162 Bipolar disorder, current episode mixed, moderate: Secondary | ICD-10-CM

## 2023-06-09 MED ORDER — ARIPIPRAZOLE 10 MG PO TABS: 10.0000 mg | ORAL_TABLET | Freq: Every day | ORAL | 2 refills | Status: DC

## 2023-06-09 MED ORDER — TEMAZEPAM 30 MG PO CAPS: 30.0000 mg | ORAL_CAPSULE | Freq: Every day | ORAL | 2 refills | Status: DC

## 2023-06-09 MED ORDER — ALPRAZOLAM 1 MG PO TABS: 1.0000 mg | ORAL_TABLET | Freq: Three times a day (TID) | ORAL | 1 refills | Status: DC | PRN

## 2023-06-09 MED ORDER — ESCITALOPRAM OXALATE 20 MG PO TABS: 20.0000 mg | ORAL_TABLET | Freq: Every day | ORAL | 2 refills | Status: DC

## 2023-06-09 NOTE — Progress Notes (Signed)
Virtual Visit via Video Note  I connected with [name] on 06/09/23 at  1:00 PM EST by a video enabled telemedicine application and verified that I am speaking with the correct person using two identifiers.  Location: Patient: home Provider: office   I discussed the limitations of evaluation and management by telemedicine and the availability of in person appointments. The patient expressed understanding and agreed to proceed.     I discussed the assessment and treatment plan with the patient. The patient was provided an opportunity to ask questions and all were answered. The patient agreed with the plan and demonstrated an understanding of the instructions.   The patient was advised to call back or seek an in-person evaluation if the symptoms worsen or if the condition fails to improve as anticipated.  I provided 20 minutes of non-face-to-face time during this encounter.   [name], MD  [facility] MD/PA/NP OP Progress Note  06/09/2023 1:10 PM [name]  MRN:  [id number]  Chief Complaint:  Chief Complaint  Patient presents with   Depression   Anxiety   Follow-up   HPI: This patient is a 63 year old widowed white female who lives alone in [place].  She has 2 sons who live outside the home.  The patient returns for follow-up after 3 months regarding her depression anxiety and mood swings.  She is still mourning the loss of her husband who died of a stroke 10 months ago.  She states she is not used to living alone and feels lonely in the evenings.  She tends to overeat.  She is spending a lot of time with her sister and other friends at a little support group.  She also is benefiting from therapy here with Florencia Reasons.  She is hoping when the weather gets warmer she will get out and walk in the evenings instead of eat snacks.  Overall she thinks her mood has been stable given the circumstances and does think that her medications continue to be helpful.  She denies any  thoughts of self-harm or suicide.  She is generally sleeping well Visit Diagnosis:    ICD-10-CM   1. Bipolar 1 disorder, mixed, moderate (HCC)  F31.62       Past Psychiatric History: Long-term outpatient treatment  Past Medical History:  Past Medical History:  Diagnosis Date   Adenomatous polyp 2009   Anxiety    Arthritis    Bipolar 1 disorder (HCC)    Constipation    Depression    Diabetes mellitus    Diabetes mellitus, type II (HCC)    Diverticula of colon 2009   Generalized headaches    GERD (gastroesophageal reflux disease)    History of hiatal hernia    small   Hypertension    no meds   Migraine    Neuropathy    both feet   Obesity    Pelvic floor dysfunction    abnormal anorectal manometry at [facility] in 2009   Plantar fasciitis both feet   Sleep apnea    cpap setting of 3.5    Past Surgical History:  Procedure Laterality Date   ABDOMINAL HYSTERECTOMY     compete   CATARACT EXTRACTION W/PHACO Right 02/15/2017   Procedure: CATARACT EXTRACTION PHACO AND INTRAOCULAR LENS PLACEMENT (IOC);  Surgeon: Jethro Bolus, MD;  Location: [facility];  Service: Ophthalmology;  Laterality: Right;  CDE: 4.18   CATARACT EXTRACTION W/PHACO Left 03/01/2017   Procedure: CATARACT EXTRACTION PHACO AND INTRAOCULAR LENS PLACEMENT (IOC);  Surgeon: Jethro Bolus, MD;  Location: [facility];  Service: Ophthalmology;  Laterality: Left;  CDE: 2.56   COLON RESECTION  03/30/2002   with end-colostomy and Hartmann's pouch   COLON SURGERY     complicated diverticulitis requiring sigmoid resection with colostomy and subsequent takedown   COLONOSCOPY  12/11/2007   Dr. Jena Gauss- marginal prep, normal rectum pancolonic diverticula, adenomatous polyp   COLONOSCOPY  08/28/2003    Wide open colonic anastomosis/Scattered diverticula noted throughout colon/ Small external hemorrhoids   COLONOSCOPY  04/2012   [facility]: hyperplastic polyps, diverticulosis, ileocolonic anastomosis.   COLONOSCOPY WITH PROPOFOL N/A 06/07/2016    Sigmoid diverticulosis, s/p prior segmental resection. 5 year surveillance. Personal history of polyps in past.   COLONOSCOPY WITH PROPOFOL N/A 12/23/2021   Procedure: COLONOSCOPY WITH PROPOFOL;  Surgeon: Corbin Ade, MD;  [facility]: [facility];  Service: Endoscopy;  Laterality: N/A;  2:45pm, asa 3, knows new arrival time per Mindy   COLOSTOMY CLOSURE  07/10/2002   ESOPHAGOGASTRODUODENOSCOPY N/A 01/08/2020   Procedure: UPPER GASTROINTESTINAL ENDOSCOPY;  Surgeon: Ovidio Kin, MD;  Location: [facility];  Service: General;  Laterality: N/A;   ESOPHAGOGASTRODUODENOSCOPY (EGD) WITH PROPOFOL N/A 08/23/2016   Procedure: ESOPHAGOGASTRODUODENOSCOPY (EGD) WITH PROPOFOL;  Surgeon: Ovidio Kin, MD;  Location: [facility];  Service: General;  Laterality: N/A;   FLEXIBLE SIGMOIDOSCOPY  01/20/2012   [facility]: incomplete/attempted colonoscopy. Inadequate prep precluded examination   HEEL SPUR SURGERY Left 09/11/2013   both have been done   KNEE ARTHROSCOPY  02/04/2004    left knee/partial medial meniscectomy.   KNEE SURGERY     3 arthroscopic  2 on left 1 on right   LAPAROSCOPIC GASTRIC BANDING  2009   LAPAROSCOPIC GASTRIC SLEEVE RESECTION N/A 01/08/2020   Procedure: LAPAROSCOPIC SLEEVE GASTRECTOMY;  Surgeon: Ovidio Kin, MD;  Location: [facility];  Service: General;  Laterality: N/A;   LAPAROSCOPIC SALPINGOOPHERECTOMY  03/30/2002   POLYPECTOMY  12/23/2021   Procedure: POLYPECTOMY;  Surgeon: Corbin Ade, MD;  [facility];  Service: Endoscopy;;   TOTAL KNEE ARTHROPLASTY Left 03/27/2021   Procedure: TOTAL KNEE ARTHROPLASTY;  Surgeon: Beverely Low, MD;  Location: [facility];  Service: Orthopedics;  Laterality: Left;   TOTAL KNEE ARTHROPLASTY Right 07/10/2021   Procedure: TOTAL KNEE ARTHROPLASTY;  Surgeon: Beverely Low, MD;  [facility];  Service: Orthopedics;  Laterality: Right;   TUBAL LIGATION      Family Psychiatric History: See below  Family History:  Family History  Problem  Relation Age of Onset   Ovarian cancer Mother    Heart disease Mother    Anxiety disorder Mother    Cirrhosis Father        deceased age 71   Alcohol abuse Father    Anxiety disorder Sister    Dementia Brother    Dementia Maternal Grandfather    Liver cancer Cousin        age 57, deceased   Drug abuse Cousin    ADD / ADHD Son    Colon cancer Other        aunt, deceased age 16   Breast cancer Other        aunt, deceased age 39   Bipolar disorder Neg Hx    Depression Neg Hx    OCD Neg Hx    Paranoid behavior Neg Hx    Schizophrenia Neg Hx    Seizures Neg Hx    Sexual abuse Neg Hx    Physical abuse Neg Hx    Sleep  apnea Neg Hx     Social History:  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    Employer: DISABLED  Tobacco Use   Smoking status: Never    Passive exposure: Never   Smokeless tobacco: Never  Vaping Use   Vaping status: Never Used  Substance and Sexual Activity   Alcohol use: Not Currently   Drug use: Never   Sexual activity: Not Currently    Birth control/protection: Surgical  Other Topics Concern   Not on file  Social History Narrative   Not on file   Social Drivers of Health   Financial Resource Strain: Not on file  Food Insecurity: No Food Insecurity (09/30/2022)   Hunger Vital Sign    Worried About Running Out of Food in the Last Year: Never true    Ran Out of Food in the Last Year: Never true  Transportation Needs: No Transportation Needs (09/30/2022)   PRAPARE - Administrator, Civil Service (Medical): No    Lack of Transportation (Non-Medical): No  Physical Activity: Not on file  Stress: Not on file  Social Connections: Not on file    Allergies:  Allergies  Allergen Reactions   Bactrim Itching, Nausea  And Vomiting and Rash    Bactrim brand name. Redness.    Metabolic Disorder Labs: Lab Results  Component Value Date   HGBA1C 7.2 (A) 06/06/2023   MPG 140 06/29/2021   MPG 139.85 03/17/2021   No results found for: "PROLACTIN" Lab Results  Component Value Date   CHOL 188 05/19/2023   TRIG 125 05/19/2023   HDL 62 05/19/2023   CHOLHDL 3.0 05/19/2023   LDLCALC 104 (H) 05/19/2023   LDLCALC 111 (H) 06/28/2022   Lab Results  Component Value Date   TSH 1.930 05/19/2023   TSH 1.060 06/15/2021    Therapeutic Level Labs: No results found for: "LITHIUM" No results found for: "VALPROATE" Lab Results  Component Value Date   CBMZ 7.5 09/19/2012   CBMZ 6.6 05/18/2011    Current Medications: Current Outpatient Medications  Medication Sig Dispense Refill   ALPRAZolam (XANAX) 1 MG tablet Take 1 tablet (1 mg total) by mouth 3 (three) times daily as needed for anxiety. 270 tablet 1   ARIPiprazole (ABILIFY) 10 MG tablet Take 1 tablet (10 mg total) by mouth at bedtime. 90 tablet 2   Ascorbic Acid 500 MG CHEW Take 500 mg by mouth daily.     Blood Glucose Monitoring Suppl (FREESTYLE LITE) DEVI Use to measure glucose 4 times a day (Patient not taking: Reported on 02/02/2023) 1 each 0   CALCIUM-VITAMIN D-VITAMIN K PO Take 1 tablet by mouth daily. Chewable     Continuous Glucose Receiver (FREESTYLE LIBRE 3 READER) DEVI 1 Piece by Does not apply route once as needed for up to 1 dose. 1 each 0   Continuous Glucose Sensor (FREESTYLE LIBRE 3 SENSOR) MISC 1 Piece by Does not apply route every 14 (fourteen) days. Place 1 sensor on the skin every 14 days. Use to check glucose continuously 2 each 3   cycloSPORINE (RESTASIS) 0.05 % ophthalmic emulsion Place 2 drops into both eyes 2 (two) times daily.      diltiazem (CARDIZEM SR) 60 MG 12  hr capsule Take 1 capsule (60 mg total) by mouth every 8 (eight) hours as needed (Palpitations). 90 capsule 1   escitalopram (LEXAPRO) 20 MG tablet Take 1 tablet (20 mg  total) by mouth daily. 90 tablet 2   fluticasone (FLONASE) 50 MCG/ACT nasal spray Place 2 sprays into the nose daily as needed for allergies.      gabapentin (NEURONTIN) 100 MG capsule Take 1 capsule (100 mg total) by mouth 2 (two) times daily. 180 capsule 1   glucose blood test strip Test 4 times a day, she has freestyle lite meter (Patient not taking: Reported on 02/02/2023) 150 each 2   Insulin Pen Needle (B-D ULTRAFINE III SHORT PEN) 31G X 8 MM MISC 1 each by Does not apply route as directed. (Patient not taking: Reported on 02/02/2023) 100 each 3   LINZESS 72 MCG capsule TAKE 1 CAPSULE DAILY BEFORE BREAKFAST 90 capsule 3   loratadine (CLARITIN) 10 MG tablet Take 10 mg by mouth daily.     Menthol, Topical Analgesic, (BENGAY EX) Apply 1 Application topically daily as needed (pain).     metFORMIN (GLUCOPHAGE-XR) 500 MG 24 hr tablet TAKE 1 TABLET DAILY WITH BREAKFAST 90 tablet 3   Multiple Vitamin (MULTIVITAMIN WITH MINERALS) TABS tablet Take 1 tablet by mouth daily. celebrate multivitamin 2 in 1     Multiple Vitamins-Minerals (IMMUNE SYSTEM BOOSTER PO) Take 1 tablet by mouth daily.     NON FORMULARY Pt uses a cpap nightly     omeprazole (PRILOSEC) 20 MG capsule Take 20 mg by mouth daily.     polyethylene glycol (MIRALAX / GLYCOLAX) 17 g packet Take 17 g by mouth daily.     Probiotic Product (PROBIOTIC PO) Take 1 tablet by mouth daily.     rizatriptan (MAXALT-MLT) 10 MG disintegrating tablet Take 10 mg by mouth as needed for migraine. May repeat in 2 hours if needed     Semaglutide, 2 MG/DOSE, 8 MG/3ML SOPN Inject 2 mg as directed once a week. 9 mL 1   temazepam (RESTORIL) 30 MG capsule Take 1 capsule (30 mg total) by mouth at bedtime. 30 capsule 2   No current facility-administered medications for this visit.     Musculoskeletal: Strength & Muscle Tone: within normal limits Gait & Station: normal Patient leans: N/A  Psychiatric Specialty Exam: Review of Systems  Musculoskeletal:   Positive for arthralgias.  All other systems reviewed and are negative.   There were no vitals taken for this visit.There is no height or weight on file to calculate BMI.  General Appearance: Casual and Fairly Groomed  Eye Contact:  Good  Speech:  Clear and Coherent  Volume:  Normal  Mood:  Euthymic  Affect:  Congruent  Thought Process:  Goal Directed  Orientation:  Full (Time, Place, and Person)  Thought Content: WDL   Suicidal Thoughts:  No  Homicidal Thoughts:  No  Memory:  Immediate;   Good Recent;   Good Remote;   Fair  Judgement:  Good  Insight:  Good  Psychomotor Activity:  Decreased  Concentration:  Concentration: Good and Attention Span: Good  Recall:  Good  Fund of Knowledge: Good  Language: Good  Akathisia:  No  Handed:  Right  AIMS (if indicated): not done  Assets:  Communication Skills Desire for Improvement Resilience Social Support Talents/Skills  ADL's:  Intact  Cognition: WNL  Sleep:  Good   Screenings: Insurance account manager from 02/02/2023 in [facility] at [facility] Video Visit from 02/17/2022 in [facility] Outpatient Behavioral [facility] at [place] Video Visit from 11/20/2021 in [facility] Outpatient Behavioral Health at [place] Video Visit from 08/12/2021 in [facility] Outpatient Behavioral Health at [place] Video Visit from 05/14/2021 in [facility] Outpatient Behavioral [facility] at [facility] Total Score 6 0 0 0 1  PHQ-9 Total Score 20 -- -- -- --      Flowsheet Row Counselor from 02/02/2023 in [facility] at [place] ED from 08/06/2022 in [facility] Department at [facility] Video Visit from 02/17/2022 in [facility] Outpatient Behavioral Health at [place]  C-SSRS RISK CATEGORY No Risk No Risk No Risk        Assessment and Plan: This patient is a 63 year old female with a history of bipolar disorder.  Although she is grieving the loss of her  husband she seems to be stable.  She will continue Lexapro 20 mg daily for depression, Abilify 10 mg at bedtime for mood stabilization, Xanax 1 mg 3 times daily for anxiety and temazepam 30 mg at bedtime for sleep.  She will return to see me in 3 months  Collaboration of Care: Collaboration of Care: Referral or follow-up with counselor/therapist AEB patient will continue therapy with Florencia Reasons in our office  Patient/Guardian was advised Release of Information must be obtained prior to any record release in order to collaborate their care with an outside provider. Patient/Guardian was advised if they have not already done so to contact the registration department to sign all necessary forms in order for [place] to release information regarding their care.   Consent: Patient/Guardian gives verbal consent for treatment and assignment of benefits for services provided during this visit. Patient/Guardian expressed understanding and agreed to proceed.    [name], MD 06/09/2023, 1:10 PM

## 2023-06-16 ENCOUNTER — Ambulatory Visit (HOSPITAL_COMMUNITY): Payer: Medicare Other | Admitting: Psychiatry

## 2023-06-22 ENCOUNTER — Telehealth: Payer: Self-pay | Admitting: Adult Health

## 2023-06-22 NOTE — Telephone Encounter (Signed)
VM box full, sent mychart msg informing pt that 4/2 appt needs r/s- Megan in meeting.

## 2023-06-23 ENCOUNTER — Ambulatory Visit: Payer: Medicare Other | Admitting: Adult Health

## 2023-06-30 ENCOUNTER — Ambulatory Visit (INDEPENDENT_AMBULATORY_CARE_PROVIDER_SITE_OTHER): Payer: Medicare Other | Admitting: Psychiatry

## 2023-06-30 DIAGNOSIS — Z634 Disappearance and death of family member: Secondary | ICD-10-CM | POA: Diagnosis not present

## 2023-06-30 DIAGNOSIS — F3162 Bipolar disorder, current episode mixed, moderate: Secondary | ICD-10-CM
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
## 2023-07-14 ENCOUNTER — Ambulatory Visit (HOSPITAL_COMMUNITY): Payer: Medicare Other | Admitting: Psychiatry

## 2023-07-18 ENCOUNTER — Ambulatory Visit (INDEPENDENT_AMBULATORY_CARE_PROVIDER_SITE_OTHER): Payer: Medicare Other | Admitting: Psychiatry

## 2023-07-18 DIAGNOSIS — F3162 Bipolar disorder, current episode mixed, moderate: Secondary | ICD-10-CM

## 2023-07-18 DIAGNOSIS — Z634 Disappearance and death of family member: Secondary | ICD-10-CM | POA: Diagnosis not present
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
## 2023-07-19 DIAGNOSIS — M79671 Pain in right foot: Secondary | ICD-10-CM | POA: Diagnosis not present

## 2023-08-02 ENCOUNTER — Ambulatory Visit (INDEPENDENT_AMBULATORY_CARE_PROVIDER_SITE_OTHER): Payer: Medicare Other | Admitting: Psychiatry

## 2023-08-02 DIAGNOSIS — Z634 Disappearance and death of family member: Secondary | ICD-10-CM | POA: Diagnosis not present

## 2023-08-02 DIAGNOSIS — F3162 Bipolar disorder, current episode mixed, moderate: Secondary | ICD-10-CM | POA: Diagnosis not present
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
## 2023-08-10 ENCOUNTER — Ambulatory Visit: Payer: Medicare Other | Admitting: Adult Health

## 2023-08-10 ENCOUNTER — Encounter: Payer: Self-pay | Admitting: Adult Health

## 2023-08-11 ENCOUNTER — Telehealth (INDEPENDENT_AMBULATORY_CARE_PROVIDER_SITE_OTHER): Admitting: Adult Health

## 2023-08-11 DIAGNOSIS — G4733 Obstructive sleep apnea (adult) (pediatric): Secondary | ICD-10-CM | POA: Diagnosis not present

## 2023-08-11 NOTE — Progress Notes (Signed)
 PATIENT: [name] DOB: Nov 04, 1960  REASON FOR VISIT: follow up HISTORY FROM: patient PRIMARY NEUROLOGIST: Dr. Frances Furbish   Virtual Visit via Video Note  I connected with [name] on 08/11/23 at  2:30 PM EDT by a video enabled telemedicine application located remotely at [facility] and verified that I am speaking with the correct person using two identifiers who was located at their own home.   I discussed the limitations of evaluation and management by telemedicine and the availability of in person appointments. The patient expressed understanding and agreed to proceed.   PATIENT: [name] DOB: 29-Feb-1961  REASON FOR VISIT: follow up HISTORY FROM: patient  HISTORY OF PRESENT ILLNESS: Today 08/11/23:  CHINELO BENN is a 63 y.o. female with a history of obstructive sleep apnea on CPAP. Returns today for follow-up.  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  We do not have her CPAP data right now.  She states that she is using it regularly but she has been without supplies.  And she notices that it is affecting her sleep.  She is willing to bring her CPAP by Monday for download.  HISTORY 06/24/22:   SARAHGRACE BROMAN is a 63 y.o. female with a history of OSA on CPAP. Returns today for follow-up.  She reports that she has not been sleeping well at night due to 2 knee surgeries.  States that she has been taking more naps throughout the day in her recliner.  Her download is below.     REVIEW OF SYSTEMS: Out of a complete 14 system review of symptoms, the patient complains only of the following symptoms, and all other reviewed systems are negative.  ALLERGIES: Allergies  Allergen Reactions   Bactrim Itching, Nausea And Vomiting and Rash    Bactrim brand name. Redness.    HOME MEDICATIONS: Outpatient Medications Prior to Visit  Medication Sig Dispense Refill   ALPRAZolam (XANAX) 1 MG tablet Take  1 tablet (1 mg total) by mouth 3 (three) times daily as needed for anxiety. 270 tablet 1   ARIPiprazole (ABILIFY) 10 MG tablet Take 1 tablet (10 mg total) by mouth at bedtime. 90 tablet 2   Ascorbic Acid 500 MG CHEW Take 500 mg by mouth daily.     Blood Glucose Monitoring Suppl (FREESTYLE LITE) DEVI Use to measure glucose 4 times a day (Patient not taking: Reported on 02/02/2023) 1 each 0   CALCIUM-VITAMIN D-VITAMIN K PO Take 1 tablet by mouth daily. Chewable     Continuous Glucose Receiver (FREESTYLE LIBRE 3 READER) DEVI 1 Piece by Does not apply route once as needed for up to 1 dose. 1 each 0   Continuous Glucose Sensor (FREESTYLE LIBRE 3 SENSOR) MISC 1 Piece by Does not apply route every 14 (fourteen) days. Place 1 sensor on the skin every 14 days. Use to check glucose continuously 2 each 3   cycloSPORINE (RESTASIS) 0.05 % ophthalmic emulsion Place 2 drops into both eyes 2 (two) times daily.      diltiazem (CARDIZEM SR) 60 MG 12 hr capsule Take 1 capsule (60 mg total) by mouth every 8 (eight) hours as needed (Palpitations). 90 capsule 1   escitalopram (LEXAPRO) 20 MG tablet Take 1 tablet (20 mg total) by mouth daily. 90 tablet 2   fluticasone (FLONASE) 50 MCG/ACT nasal spray Place 2 sprays into the nose daily as needed for allergies.      gabapentin (  NEURONTIN) 100 MG capsule Take 1 capsule (100 mg total) by mouth 2 (two) times daily. 180 capsule 1   glucose blood test strip Test 4 times a day, she has freestyle lite meter (Patient not taking: Reported on 02/02/2023) 150 each 2   Insulin Pen Needle (B-D ULTRAFINE III SHORT PEN) 31G X 8 MM MISC 1 each by Does not apply route as directed. (Patient not taking: Reported on 02/02/2023) 100 each 3   LINZESS 72 MCG capsule TAKE 1 CAPSULE DAILY BEFORE BREAKFAST 90 capsule 3   loratadine (CLARITIN) 10 MG tablet Take 10 mg by mouth daily.     Menthol, Topical Analgesic, (BENGAY EX) Apply 1 Application topically daily as needed (pain).     metFORMIN  (GLUCOPHAGE-XR) 500 MG 24 hr tablet TAKE 1 TABLET DAILY WITH BREAKFAST 90 tablet 3   Multiple Vitamin (MULTIVITAMIN WITH MINERALS) TABS tablet Take 1 tablet by mouth daily. celebrate multivitamin 2 in 1     Multiple Vitamins-Minerals (IMMUNE SYSTEM BOOSTER PO) Take 1 tablet by mouth daily.     NON FORMULARY Pt uses a cpap nightly     omeprazole (PRILOSEC) 20 MG capsule Take 20 mg by mouth daily.     polyethylene glycol (MIRALAX / GLYCOLAX) 17 g packet Take 17 g by mouth daily.     Probiotic Product (PROBIOTIC PO) Take 1 tablet by mouth daily.     rizatriptan (MAXALT-MLT) 10 MG disintegrating tablet Take 10 mg by mouth as needed for migraine. May repeat in 2 hours if needed     Semaglutide, 2 MG/DOSE, 8 MG/3ML SOPN Inject 2 mg as directed once a week. 9 mL 1   temazepam (RESTORIL) 30 MG capsule Take 1 capsule (30 mg total) by mouth at bedtime. 30 capsule 2   No facility-administered medications prior to visit.    PAST MEDICAL HISTORY: Past Medical History:  Diagnosis Date   Adenomatous polyp 2009   Anxiety    Arthritis    Bipolar 1 disorder (HCC)    Constipation    Depression    Diabetes mellitus    Diabetes mellitus, type II (HCC)    Diverticula of colon 2009   Generalized headaches    GERD (gastroesophageal reflux disease)    History of hiatal hernia    small   Hypertension    no meds   Migraine    Neuropathy    both feet   Obesity    Pelvic floor dysfunction    abnormal anorectal manometry at [facility] in 2009   Plantar fasciitis both feet   Sleep apnea    cpap setting of 3.5    PAST SURGICAL HISTORY: Past Surgical History:  Procedure Laterality Date   ABDOMINAL HYSTERECTOMY     compete   CATARACT EXTRACTION W/PHACO Right 02/15/2017   Procedure: CATARACT EXTRACTION PHACO AND INTRAOCULAR LENS PLACEMENT (IOC);  Surgeon: Jethro Bolus, MD;  Location: [facility];  Service: Ophthalmology;  Laterality: Right;  CDE: 4.18   CATARACT EXTRACTION W/PHACO Left 03/01/2017   Procedure:  CATARACT EXTRACTION PHACO AND INTRAOCULAR LENS PLACEMENT (IOC);  Surgeon: Jethro Bolus, MD;  Location: [facility];  Service: Ophthalmology;  Laterality: Left;  CDE: 2.56   COLON RESECTION  03/30/2002   with end-colostomy and Hartmann's pouch   COLON SURGERY     complicated diverticulitis requiring sigmoid resection with colostomy and subsequent takedown   COLONOSCOPY  12/11/2007   Dr. Jena Gauss- marginal prep, normal rectum pancolonic diverticula, adenomatous polyp   COLONOSCOPY  08/28/2003  Wide open colonic anastomosis/Scattered diverticula noted throughout colon/ Small external hemorrhoids   COLONOSCOPY  04/2012   [facility]: hyperplastic polyps, diverticulosis, ileocolonic anastomosis.   COLONOSCOPY WITH PROPOFOL N/A 06/07/2016   Sigmoid diverticulosis, s/p prior segmental resection. 5 year surveillance. Personal history of polyps in past.   COLONOSCOPY WITH PROPOFOL N/A 12/23/2021   Procedure: COLONOSCOPY WITH PROPOFOL;  Surgeon: Corbin Ade, MD;  Location: [facility];  Service: Endoscopy;  Laterality: N/A;  2:45pm, asa 3, knows new arrival time per Mindy   COLOSTOMY CLOSURE  07/10/2002   ESOPHAGOGASTRODUODENOSCOPY N/A 01/08/2020   Procedure: UPPER GASTROINTESTINAL ENDOSCOPY;  Surgeon: Ovidio Kin, MD;  Location: [facility];  Service: General;  Laterality: N/A;   ESOPHAGOGASTRODUODENOSCOPY (EGD) WITH PROPOFOL N/A 08/23/2016   Procedure: ESOPHAGOGASTRODUODENOSCOPY (EGD) WITH PROPOFOL;  Surgeon: Ovidio Kin, MD;  Location: [facility];  Service: General;  Laterality: N/A;   FLEXIBLE SIGMOIDOSCOPY  01/20/2012   [facility]: incomplete/attempted colonoscopy. Inadequate prep precluded examination   HEEL SPUR SURGERY Left 09/11/2013   both have been done   KNEE ARTHROSCOPY  02/04/2004    left knee/partial medial meniscectomy.   KNEE SURGERY     3 arthroscopic  2 on left 1 on right   LAPAROSCOPIC GASTRIC BANDING  2009   LAPAROSCOPIC GASTRIC SLEEVE RESECTION N/A 01/08/2020   Procedure: LAPAROSCOPIC  SLEEVE GASTRECTOMY;  Surgeon: Ovidio Kin, MD;  Location: [facility];  Service: General;  Laterality: N/A;   LAPAROSCOPIC SALPINGOOPHERECTOMY  03/30/2002   POLYPECTOMY  12/23/2021   Procedure: POLYPECTOMY;  Surgeon: Corbin Ade, MD;  Location: [facility];  Service: Endoscopy;;   TOTAL KNEE ARTHROPLASTY Left 03/27/2021   Procedure: TOTAL KNEE ARTHROPLASTY;  Surgeon: Beverely Low, MD;  Location: [facility];  Service: Orthopedics;  Laterality: Left;   TOTAL KNEE ARTHROPLASTY Right 07/10/2021   Procedure: TOTAL KNEE ARTHROPLASTY;  Surgeon: Beverely Low, MD;  Location: [facility];  Service: Orthopedics;  Laterality: Right;   TUBAL LIGATION      FAMILY HISTORY: Family History  Problem Relation Age of Onset   Ovarian cancer Mother    Heart disease Mother    Anxiety disorder Mother    Cirrhosis Father        deceased age 8   Alcohol abuse Father    Anxiety disorder Sister    Dementia Brother    Dementia Maternal Grandfather    Liver cancer Cousin        age 47, deceased   Drug abuse Cousin    ADD / ADHD Son    Colon cancer Other        aunt, deceased age 47   Breast cancer Other        aunt, deceased age 12   Bipolar disorder Neg Hx    Depression Neg Hx    OCD Neg Hx    Paranoid behavior Neg Hx    Schizophrenia Neg Hx    Seizures Neg Hx    Sexual abuse Neg Hx    Physical abuse Neg Hx    Sleep apnea Neg Hx     SOCIAL HISTORY: 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    Employer: DISABLED  Tobacco Use   Smoking status: Never    Passive exposure: Never  Smokeless tobacco: Never  Vaping Use   Vaping status: Never Used  Substance and Sexual Activity   Alcohol use: Not Currently   Drug use: Never   Sexual activity: Not Currently    Birth  control/protection: Surgical  Other Topics Concern   Not on file  Social History Narrative   Not on file   Social Drivers of Health   Financial Resource Strain: Not on file  Food Insecurity: No Food Insecurity (09/30/2022)   Hunger Vital Sign    Worried About Running Out of Food in the Last Year: Never true    Ran Out of Food in the Last Year: Never true  Transportation Needs: No Transportation Needs (09/30/2022)   PRAPARE - Administrator, Civil Service (Medical): No    Lack of Transportation (Non-Medical): No  Physical Activity: Not on file  Stress: Not on file  Social Connections: Not on file  Intimate Partner Violence: Not At Risk (09/30/2022)   Humiliation, Afraid, Rape, and Kick questionnaire    Fear of Current or Ex-Partner: No    Emotionally Abused: No    Physically Abused: No    Sexually Abused: No      PHYSICAL EXAM Generalized: Well developed, in no acute distress   Neurological examination  Mentation: Alert oriented to time, place, history taking. Follows all commands speech and language fluent Cranial nerve II-XII: Facial symmetry noted  DIAGNOSTIC DATA (LABS, IMAGING, TESTING) - I reviewed patient records, labs, notes, testing and imaging myself where available.  Lab Results  Component Value Date   WBC 7.7 08/06/2022   HGB 15.3 (H) 08/06/2022   HCT 45.9 08/06/2022   MCV 88.3 08/06/2022   PLT 220 08/06/2022      Component Value Date/Time   NA 141 05/19/2023 1008   K 4.8 05/19/2023 1008   CL 99 05/19/2023 1008   CO2 25 05/19/2023 1008   GLUCOSE 113 (H) 05/19/2023 1008   GLUCOSE 104 (H) 08/06/2022 1233   BUN 24 05/19/2023 1008   CREATININE 0.82 05/19/2023 1008   CREATININE 0.74 03/23/2011 0400   CALCIUM 9.6 05/19/2023 1008   PROT 7.2 05/19/2023 1008   ALBUMIN 4.4 05/19/2023 1008   AST 17 05/19/2023 1008   ALT 15 05/19/2023 1008   ALKPHOS 92 05/19/2023 1008   BILITOT 0.3 05/19/2023 1008   GFRNONAA >60 08/06/2022 1233   GFRAA >60  01/01/2020 1356   Lab Results  Component Value Date   CHOL 188 05/19/2023   HDL 62 05/19/2023   LDLCALC 104 (H) 05/19/2023   TRIG 125 05/19/2023   CHOLHDL 3.0 05/19/2023   Lab Results  Component Value Date   HGBA1C 7.2 (A) 06/06/2023   Lab Results  Component Value Date   VITAMINB12 1,034 05/19/2023   Lab Results  Component Value Date   TSH 1.930 05/19/2023      ASSESSMENT AND PLAN 63 y.o. year old female  has a past medical history of Adenomatous polyp (2009), Anxiety, Arthritis, Bipolar 1 disorder (HCC), Constipation, Depression, Diabetes mellitus, Diabetes mellitus, type II (HCC), Diverticula of colon (2009), Generalized headaches, GERD (gastroesophageal reflux disease), History of hiatal hernia, Hypertension, Migraine, Neuropathy, Obesity, Pelvic floor dysfunction, Plantar fasciitis (both feet), and Sleep apnea. here with:  OSA on CPAP  Will bring card by Monday- she is a card DL Need new supplies- order sent  Encouraged patient to continue using CPAP nightly and > 4 hours each night F/U in 1 year or sooner if needed   Butch Penny,  MSN, NP-C 08/11/2023, 2:47 PM [facility] [address] [place], [place] [address] [phone]

## 2023-08-11 NOTE — Progress Notes (Signed)
 Cpap supply order sent to [facility].

## 2023-08-15 NOTE — Progress Notes (Signed)
 New, [name], Otilio Jefferson, RN; Alain Honey; Jeris Penta, [place]; 1 other Received, Thank you!

## 2023-08-16 ENCOUNTER — Ambulatory Visit (HOSPITAL_COMMUNITY): Payer: Medicare Other | Admitting: Psychiatry

## 2023-08-30 NOTE — Progress Notes (Unsigned)
 Referring Provider: Minus Amel, MD Primary Care Physician:  Minus Amel, MD Primary GI Physician: Dr. Riley Cheadle  Chief Complaint  Patient presents with   Constipation    Having problems with constipation     HPI:   [name] is a 63 y.o. female   with history of GERD, chronic constipation, complicated diverticulitis in the remote past requiring surgery, adenomatous colon polyps due for surveillance colonoscopy in 2028, presenting today  with chief complaint of constipation.  Today:  Has been taking Lizness 72 mcg daily but seems to start to lose effect over the last couple of months. Also takes Miralax  daily, but still with uncontrolled symptoms. Skipping on average 4 days between bowel movements. Occasional blood when hemorrhoids prolapse.  Infrequent.  No melena. Some lower abdominal pain that resolved yesterday when she was finally able to have a BM after taking 4, 72 mcg Linzess . Had 3 Bms, ended up with diarrhea at the end.   No unintentional weight loss. Has lost some weight with Ozempic .  Going to the [facility] daily. Trying to eat plenty of fiber and drinking plenty of water .    Started Ozempic  about 8 months ago. Thinks she started some worsening constipation after ozempic .     Last Colonoscopy 12/23/21: Diverticulosis in the entire examined colon, surgical anastomosis at 20 cm, two 5-6 mm polyps removed, nonbleeding internal hemorrhoids.  Pathology with tubular adenomas.  Recommended repeat in 5 years.    Past Medical History:  Diagnosis Date   Adenomatous polyp 2009   Anxiety    Arthritis    Bipolar 1 disorder (HCC)    Constipation    Depression    Diabetes mellitus    Diabetes mellitus, type II (HCC)    Diverticula of colon 2009   Generalized headaches    GERD (gastroesophageal reflux disease)    History of hiatal hernia    small   Hypertension    no meds   Migraine    Neuropathy    both feet   Obesity    Pelvic floor dysfunction    abnormal  anorectal manometry at [facility] in 2009   Plantar fasciitis both feet   Sleep apnea    cpap setting of 3.5    Past Surgical History:  Procedure Laterality Date   ABDOMINAL HYSTERECTOMY     compete   CATARACT EXTRACTION W/PHACO Right 02/15/2017   Procedure: CATARACT EXTRACTION PHACO AND INTRAOCULAR LENS PLACEMENT (IOC);  Surgeon: Albert Huff, MD;  Location: [facility];  Service: Ophthalmology;  Laterality: Right;  CDE: 4.18   CATARACT EXTRACTION W/PHACO Left 03/01/2017   Procedure: CATARACT EXTRACTION PHACO AND INTRAOCULAR LENS PLACEMENT (IOC);  Surgeon: Albert Huff, MD;  Location: [facility];  Service: Ophthalmology;  Laterality: Left;  CDE: 2.56   COLON RESECTION  03/30/2002   with end-colostomy and Hartmann's pouch   COLON SURGERY     complicated diverticulitis requiring sigmoid resection with colostomy and subsequent takedown   COLONOSCOPY  12/11/2007   Dr. Riley Cheadle- marginal prep, normal rectum pancolonic diverticula, adenomatous polyp   COLONOSCOPY  08/28/2003    Wide open colonic anastomosis/Scattered diverticula noted throughout colon/ Small external hemorrhoids   COLONOSCOPY  04/2012   [facility]: hyperplastic polyps, diverticulosis, ileocolonic anastomosis.   COLONOSCOPY WITH PROPOFOL  N/A 06/07/2016   Sigmoid diverticulosis, s/p prior segmental resection. 5 year surveillance. Personal history of polyps in past.   COLONOSCOPY WITH PROPOFOL  N/A 12/23/2021   Procedure: COLONOSCOPY WITH PROPOFOL ;  Surgeon: Suzette Espy, MD;  Location:  [facility];  Service: Endoscopy;  Laterality: N/A;  2:45pm, asa 3, knows new arrival time per Mindy   COLOSTOMY CLOSURE  07/10/2002   ESOPHAGOGASTRODUODENOSCOPY N/A 01/08/2020   Procedure: UPPER GASTROINTESTINAL ENDOSCOPY;  Surgeon: Juanita Norlander, MD;  Location: [facility];  Service: General;  Laterality: N/A;   ESOPHAGOGASTRODUODENOSCOPY (EGD) WITH PROPOFOL  N/A 08/23/2016   Procedure: ESOPHAGOGASTRODUODENOSCOPY (EGD) WITH PROPOFOL ;  Surgeon: Juanita Norlander, MD;   Location: [facility];  Service: General;  Laterality: N/A;   FLEXIBLE SIGMOIDOSCOPY  01/20/2012   RMR: incomplete/attempted colonoscopy. Inadequate prep precluded examination   HEEL SPUR SURGERY Left 09/11/2013   both have been done   KNEE ARTHROSCOPY  02/04/2004    left knee/partial medial meniscectomy.   KNEE SURGERY     3 arthroscopic  2 on left 1 on right   LAPAROSCOPIC GASTRIC BANDING  2009   LAPAROSCOPIC GASTRIC SLEEVE RESECTION N/A 01/08/2020   Procedure: LAPAROSCOPIC SLEEVE GASTRECTOMY;  Surgeon: Juanita Norlander, MD;  Location: [facility];  Service: General;  Laterality: N/A;   LAPAROSCOPIC SALPINGOOPHERECTOMY  03/30/2002   POLYPECTOMY  12/23/2021   Procedure: POLYPECTOMY;  Surgeon: Suzette Espy, MD;  Location: [facility];  Service: Endoscopy;;   TOTAL KNEE ARTHROPLASTY Left 03/27/2021   Procedure: TOTAL KNEE ARTHROPLASTY;  Surgeon: Winston Hawking, MD;  Location: [facility];  Service: Orthopedics;  Laterality: Left;   TOTAL KNEE ARTHROPLASTY Right 07/10/2021   Procedure: TOTAL KNEE ARTHROPLASTY;  Surgeon: Winston Hawking, MD;  Location: [facility];  Service: Orthopedics;  Laterality: Right;   TUBAL LIGATION      Current Outpatient Medications  Medication Sig Dispense Refill   ALPRAZolam  (XANAX ) 1 MG tablet Take 1 tablet (1 mg total) by mouth 3 (three) times daily as needed for anxiety. 270 tablet 1   ARIPiprazole  (ABILIFY ) 10 MG tablet Take 1 tablet (10 mg total) by mouth at bedtime. 90 tablet 2   Ascorbic Acid  500 MG CHEW Take 500 mg by mouth daily.     Blood Glucose Monitoring Suppl (FREESTYLE LITE) DEVI Use to measure glucose 4 times a day 1 each 0   CALCIUM -VITAMIN D -VITAMIN K PO Take 1 tablet by mouth daily. Chewable     Continuous Glucose Receiver (FREESTYLE LIBRE 3 READER) DEVI 1 Piece by Does not apply route once as needed for up to 1 dose. 1 each 0   Continuous Glucose Sensor (FREESTYLE LIBRE 3 SENSOR) MISC 1 Piece by Does not apply route every 14 (fourteen) days. Place 1 sensor  on the skin every 14 days. Use to check glucose continuously 2 each 3   cycloSPORINE  (RESTASIS ) 0.05 % ophthalmic emulsion Place 2 drops into both eyes 2 (two) times daily.      escitalopram  (LEXAPRO ) 20 MG tablet Take 1 tablet (20 mg total) by mouth daily. 90 tablet 2   fluticasone  (FLONASE ) 50 MCG/ACT nasal spray Place 2 sprays into the nose daily as needed for allergies.      gabapentin  (NEURONTIN ) 100 MG capsule Take 1 capsule (100 mg total) by mouth 2 (two) times daily. 180 capsule 1   glucose blood test strip Test 4 times a day, she has freestyle lite meter 150 each 2   Insulin  Pen Needle (B-D ULTRAFINE III SHORT PEN) 31G X 8 MM MISC 1 each by Does not apply route as directed. 100 each 3   linaclotide  (LINZESS ) 145 MCG CAPS capsule Take 1 capsule (145 mcg total) by mouth daily before breakfast. 90 capsule 1   loratadine  (CLARITIN ) 10 MG tablet  Take 10 mg by mouth daily.     Menthol , Topical Analgesic, (BENGAY EX) Apply 1 Application topically daily as needed (pain).     metFORMIN  (GLUCOPHAGE -XR) 500 MG 24 hr tablet TAKE 1 TABLET DAILY WITH BREAKFAST 90 tablet 3   Multiple Vitamin (MULTIVITAMIN WITH MINERALS) TABS tablet Take 1 tablet by mouth daily. celebrate multivitamin 2 in 1     Multiple Vitamins-Minerals (IMMUNE SYSTEM BOOSTER PO) Take 1 tablet by mouth daily.     NON FORMULARY Pt uses a cpap nightly     omeprazole (PRILOSEC) 20 MG capsule Take 20 mg by mouth daily.     polyethylene glycol (MIRALAX  / GLYCOLAX ) 17 g packet Take 17 g by mouth daily.     Probiotic Product (PROBIOTIC PO) Take 1 tablet by mouth daily.     rizatriptan  (MAXALT -MLT) 10 MG disintegrating tablet Take 10 mg by mouth as needed for migraine. May repeat in 2 hours if needed     Semaglutide , 2 MG/DOSE, 8 MG/3ML SOPN Inject 2 mg as directed once a week. 9 mL 1   temazepam  (RESTORIL ) 30 MG capsule Take 1 capsule (30 mg total) by mouth at bedtime. 30 capsule 2   diltiazem  (CARDIZEM  SR) 60 MG 12 hr capsule Take 1 capsule  (60 mg total) by mouth every 8 (eight) hours as needed (Palpitations). (Patient not taking: Reported on 08/31/2023) 90 capsule 1   No current facility-administered medications for this visit.    Allergies as of 08/31/2023 - Review Complete 08/31/2023  Allergen Reaction Noted   Bactrim Itching, Nausea And Vomiting, and Rash 01/09/2011    Family History  Problem Relation Age of Onset   Ovarian cancer Mother    Heart disease Mother    Anxiety disorder Mother    Cirrhosis Father        deceased age 42   Alcohol  abuse Father    Anxiety disorder Sister    Dementia Brother    Dementia Maternal Grandfather    Liver cancer Cousin        age 64, deceased   Drug abuse Cousin    ADD / ADHD Son    Colon cancer Other        aunt, deceased age 50   Breast cancer Other        aunt, deceased age 49   Bipolar disorder Neg Hx    Depression Neg Hx    OCD Neg Hx    Paranoid behavior Neg Hx    Schizophrenia Neg Hx    Seizures Neg Hx    Sexual abuse Neg Hx    Physical abuse Neg Hx    Sleep apnea Neg Hx     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    Employer: DISABLED  Tobacco Use   Smoking status: Never    Passive exposure: Never   Smokeless tobacco: Never  Vaping Use   Vaping status: Never Used  Substance and Sexual Activity   Alcohol  use: Not Currently   Drug use: Never   Sexual activity: Not Currently    Birth control/protection: Surgical  Other Topics Concern   Not on file  Social History Narrative   Not on file   Social Drivers of Health  Financial Resource Strain: Not on file  Food Insecurity: No Food Insecurity (09/30/2022)   Hunger Vital Sign    Worried About Running Out of Food in the Last Year: Never true    Ran Out of Food in the Last  Year: Never true  Transportation Needs: No Transportation Needs (09/30/2022)   PRAPARE - Administrator, Civil Service (Medical): No    Lack of Transportation (Non-Medical): No  Physical Activity: Not on file  Stress: Not on file  Social Connections: Not on file    Review of Systems: Gen: Denies fever, chills, cold or flulike symptoms, presyncope, syncope. GI: See HPI Heme: See HPI  Physical Exam: BP 138/86 (BP Location: Right Arm, Patient Position: Sitting, Cuff Size: Large)   Pulse 66   Temp (!) 96.9 F (36.1 C) (Temporal)   Ht 5\' 7"  (1.702 m)   Wt 256 lb 9.6 oz (116.4 kg)   BMI 40.19 kg/m  General:   Alert and oriented. No distress noted. Pleasant and cooperative.  Head:  Normocephalic and atraumatic. Eyes:  Conjuctiva clear without scleral icterus. Abdomen:  +BS, soft, and non-distended.  Very mild TTP in LLQ, LUQ.  No rebound or guarding. No HSM or masses noted. Msk:  Symmetrical without gross deformities. Normal posture. Extremities:  Without edema. Neurologic:  Alert and  oriented x4 Psych:  Normal mood and affect.    Assessment:  63 y.o. female   with history of GERD, chronic constipation, complicated diverticulitis in the remote past requiring surgery, adenomatous colon polyps due for surveillance colonoscopy in 2028, presenting today  with chief complaint of constipation.   Chronic constipation:  Worsening in the setting of Ozempic . Associated lower abdominal pain that improved with a good BM.  No alarm symptoms.    Plan:  Increase Linzess  to 145 mcg daily.  Patient will let me know if she does not have adequate bowel movements on a daily basis with this and we can increase to 290 mcg daily. Patient will monitor for any recurrent, persistent or worsening abdominal pain and let me know if this occurs. Follow-up in 3 months or sooner if needed.   Shana Daring, PA-C [facility] 08/31/2023

## 2023-08-31 ENCOUNTER — Encounter: Payer: Self-pay | Admitting: Gastroenterology

## 2023-08-31 ENCOUNTER — Ambulatory Visit (INDEPENDENT_AMBULATORY_CARE_PROVIDER_SITE_OTHER): Admitting: Gastroenterology

## 2023-08-31 VITALS — BP 138/86 | HR 66 | Temp 96.9°F | Ht 67.0 in | Wt 256.6 lb

## 2023-08-31 DIAGNOSIS — K5900 Constipation, unspecified: Secondary | ICD-10-CM

## 2023-08-31 DIAGNOSIS — K5909 Other constipation: Secondary | ICD-10-CM

## 2023-08-31 MED ORDER — LINACLOTIDE 145 MCG PO CAPS
145.0000 ug | ORAL_CAPSULE | Freq: Every day | ORAL | 1 refills | Status: DC
Start: 2023-08-31 — End: 2023-11-30

## 2023-08-31 NOTE — Patient Instructions (Signed)
 Increase Linzess  to 145 mcg daily, 30 minutes before breakfast.  I have sent a new prescription to [facility] for you.  You can take 2 of your Linzess  72 mcg pills together each morning until you run out.  If Linzess  145 mcg does not allow you to have good productive bowel movements daily, please let me know, and I will increase your dose to 290 mcg daily.  I will plan to see you back in the office in 3 months or sooner if needed.  Shana Daring, PA-C [facility]

## 2023-09-01 ENCOUNTER — Telehealth (HOSPITAL_COMMUNITY): Admitting: Psychiatry

## 2023-09-01 ENCOUNTER — Encounter (HOSPITAL_COMMUNITY): Payer: Self-pay | Admitting: Psychiatry

## 2023-09-01 DIAGNOSIS — F3162 Bipolar disorder, current episode mixed, moderate: Secondary | ICD-10-CM

## 2023-09-01 MED ORDER — ALPRAZOLAM 1 MG PO TABS: 1.0000 mg | ORAL_TABLET | Freq: Three times a day (TID) | ORAL | 1 refills | Status: DC | PRN

## 2023-09-01 MED ORDER — TEMAZEPAM 30 MG PO CAPS: 30.0000 mg | ORAL_CAPSULE | Freq: Every day | ORAL | 2 refills | Status: DC

## 2023-09-01 MED ORDER — ESCITALOPRAM OXALATE 20 MG PO TABS: 20.0000 mg | ORAL_TABLET | Freq: Every day | ORAL | 2 refills | Status: DC

## 2023-09-01 MED ORDER — ARIPIPRAZOLE 10 MG PO TABS: 10.0000 mg | ORAL_TABLET | Freq: Every day | ORAL | 2 refills | Status: DC

## 2023-09-01 NOTE — Progress Notes (Signed)
 Virtual Visit via Video Note  I connected with [name] on 09/01/23 at  1:00 PM EDT by a video enabled telemedicine application and verified that I am speaking with the correct person using two identifiers.  Location: Patient: home Provider: office   I discussed the limitations of evaluation and management by telemedicine and the availability of in person appointments. The patient expressed understanding and agreed to proceed.      I discussed the assessment and treatment plan with the patient. The patient was provided an opportunity to ask questions and all were answered. The patient agreed with the plan and demonstrated an understanding of the instructions.   The patient was advised to call back or seek an in-person evaluation if the symptoms worsen or if the condition fails to improve as anticipated.  I provided 20 minutes of non-face-to-face time during this encounter.   Alfredia Annas, MD  [facility] MD/PA/NP OP Progress Note  09/01/2023 1:09 PM [name]  MRN:  [id number]  Chief Complaint:  Chief Complaint  Patient presents with   Depression   Anxiety   Follow-up   HPI: This patient is a 63 year old widowed white female who lives alone in [place]. She has 2 sons who live outside the home.   The patient returns for follow-up after 3 months regarding her depression anxiety and mood swings.  She states that she is doing better.  Her husband died about 13 months ago but she seems to be getting through it fairly well now.  She is not so afraid to be alone.  She is getting out with her sister and other friends.  She is going to the gym.  She is benefiting from therapy.  She continues to lose weight on the Ozempic .  She denies significant depression anxiety or trouble sleeping although she still sometimes has nightmares. Visit Diagnosis:    ICD-10-CM   1. Bipolar 1 disorder, mixed, moderate (HCC)  F31.62       Past Psychiatric History: Long-term outpatient  treatment  Past Medical History:  Past Medical History:  Diagnosis Date   Adenomatous polyp 2009   Anxiety    Arthritis    Bipolar 1 disorder (HCC)    Constipation    Depression    Diabetes mellitus    Diabetes mellitus, type II (HCC)    Diverticula of colon 2009   Generalized headaches    GERD (gastroesophageal reflux disease)    History of hiatal hernia    small   Hypertension    no meds   Migraine    Neuropathy    both feet   Obesity    Pelvic floor dysfunction    abnormal anorectal manometry at [facility] in 2009   Plantar fasciitis both feet   Sleep apnea    cpap setting of 3.5    Past Surgical History:  Procedure Laterality Date   ABDOMINAL HYSTERECTOMY     compete   CATARACT EXTRACTION W/PHACO Right 02/15/2017   Procedure: CATARACT EXTRACTION PHACO AND INTRAOCULAR LENS PLACEMENT (IOC);  Surgeon: Albert Huff, MD;  Location: [facility];  Service: Ophthalmology;  Laterality: Right;  CDE: 4.18   CATARACT EXTRACTION W/PHACO Left 03/01/2017   Procedure: CATARACT EXTRACTION PHACO AND INTRAOCULAR LENS PLACEMENT (IOC);  Surgeon: Albert Huff, MD;  Location: [facility];  Service: Ophthalmology;  Laterality: Left;  CDE: 2.56   COLON RESECTION  03/30/2002   with end-colostomy and Hartmann's pouch   COLON SURGERY     complicated diverticulitis requiring sigmoid resection  with colostomy and subsequent takedown   COLONOSCOPY  12/11/2007   Dr. Riley Cheadle- marginal prep, normal rectum pancolonic diverticula, adenomatous polyp   COLONOSCOPY  08/28/2003    Wide open colonic anastomosis/Scattered diverticula noted throughout colon/ Small external hemorrhoids   COLONOSCOPY  04/2012   [facility]: hyperplastic polyps, diverticulosis, ileocolonic anastomosis.   COLONOSCOPY WITH PROPOFOL  N/A 06/07/2016   Sigmoid diverticulosis, s/p prior segmental resection. 5 year surveillance. Personal history of polyps in past.   COLONOSCOPY WITH PROPOFOL  N/A 12/23/2021   Procedure: COLONOSCOPY WITH PROPOFOL ;  Surgeon:  Suzette Espy, MD;  Location: [facility];  Service: Endoscopy;  Laterality: N/A;  2:45pm, asa 3, knows new arrival time per Mindy   COLOSTOMY CLOSURE  07/10/2002   ESOPHAGOGASTRODUODENOSCOPY N/A 01/08/2020   Procedure: UPPER GASTROINTESTINAL ENDOSCOPY;  Surgeon: Juanita Norlander, MD;  Location: [facility];  Service: General;  Laterality: N/A;   ESOPHAGOGASTRODUODENOSCOPY (EGD) WITH PROPOFOL  N/A 08/23/2016   Procedure: ESOPHAGOGASTRODUODENOSCOPY (EGD) WITH PROPOFOL ;  Surgeon: Juanita Norlander, MD;  Location: [facility];  Service: General;  Laterality: N/A;   FLEXIBLE SIGMOIDOSCOPY  01/20/2012   RMR: incomplete/attempted colonoscopy. Inadequate prep precluded examination   HEEL SPUR SURGERY Left 09/11/2013   both have been done   KNEE ARTHROSCOPY  02/04/2004    left knee/partial medial meniscectomy.   KNEE SURGERY     3 arthroscopic  2 on left 1 on right   LAPAROSCOPIC GASTRIC BANDING  2009   LAPAROSCOPIC GASTRIC SLEEVE RESECTION N/A 01/08/2020   Procedure: LAPAROSCOPIC SLEEVE GASTRECTOMY;  Surgeon: Juanita Norlander, MD;  Location: [facility];  Service: General;  Laterality: N/A;   LAPAROSCOPIC SALPINGOOPHERECTOMY  03/30/2002   POLYPECTOMY  12/23/2021   Procedure: POLYPECTOMY;  Surgeon: Suzette Espy, MD;  [facility]: [facility];  Service: Endoscopy;;   TOTAL KNEE ARTHROPLASTY Left 03/27/2021   Procedure: TOTAL KNEE ARTHROPLASTY;  Surgeon: Winston Hawking, MD;  Location: [facility];  Service: Orthopedics;  Laterality: Left;   TOTAL KNEE ARTHROPLASTY Right 07/10/2021   Procedure: TOTAL KNEE ARTHROPLASTY;  Surgeon: Winston Hawking, MD;  Location: [facility];  Service: Orthopedics;  Laterality: Right;   TUBAL LIGATION      Family Psychiatric History: See below  Family History:  Family History  Problem Relation Age of Onset   Ovarian cancer Mother    Heart disease Mother    Anxiety disorder Mother    Cirrhosis Father        deceased age 63   Alcohol  abuse Father    Anxiety disorder Sister    Dementia  Brother    Dementia Maternal Grandfather    Liver cancer Cousin        age 75, deceased   Drug abuse Cousin    ADD / ADHD Son    Colon cancer Other        aunt, deceased age 27   Breast cancer Other        aunt, deceased age 43   Bipolar disorder Neg Hx    Depression Neg Hx    OCD Neg Hx    Paranoid behavior Neg Hx    Schizophrenia Neg Hx    Seizures Neg Hx    Sexual abuse Neg Hx    Physical abuse Neg Hx    Sleep apnea Neg Hx     Social History:  Social History   Socioeconomic History   Marital status: Widowed    Spouse name: Not on file   Number of children: 2   Years of education: college  Highest education level: Not on file  Occupational History   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    Employer: DISABLED  Tobacco Use   Smoking status: Never    Passive exposure: Never   Smokeless tobacco: Never  Vaping Use   Vaping status: Never Used  Substance and Sexual Activity   Alcohol  use: Not Currently   Drug use: Never   Sexual activity: Not Currently    Birth control/protection: Surgical  Other Topics Concern   Not on file  Social History Narrative   Not on file   Social Drivers of Health   Financial Resource Strain: Not on file  Food Insecurity: No Food Insecurity (09/30/2022)   Hunger Vital Sign    Worried About Running Out of Food in the Last Year: Never true    Ran Out of Food in the Last Year: Never true  Transportation Needs: No Transportation Needs (09/30/2022)   PRAPARE - Administrator, Civil Service (Medical): No    Lack of Transportation (Non-Medical): No  Physical Activity: Not on file  Stress: Not on file  Social Connections: Not on file    Allergies:  Allergies  Allergen Reactions   Bactrim Itching, Nausea And Vomiting and Rash    Bactrim brand name. Redness.    Metabolic Disorder Labs: Lab Results  Component Value Date   HGBA1C 7.2 (A) 06/06/2023   MPG 140 06/29/2021   MPG 139.85 03/17/2021   No  results found for: "PROLACTIN" Lab Results  Component Value Date   CHOL 188 05/19/2023   TRIG 125 05/19/2023   HDL 62 05/19/2023   CHOLHDL 3.0 05/19/2023   LDLCALC 104 (H) 05/19/2023   LDLCALC 111 (H) 06/28/2022   Lab Results  Component Value Date   TSH 1.930 05/19/2023   TSH 1.060 06/15/2021    Therapeutic Level Labs: No results found for: "LITHIUM" No results found for: "VALPROATE" Lab Results  Component Value Date   CBMZ 7.5 09/19/2012   CBMZ 6.6 05/18/2011    Current Medications: Current Outpatient Medications  Medication Sig Dispense Refill   ALPRAZolam  (XANAX ) 1 MG tablet Take 1 tablet (1 mg total) by mouth 3 (three) times daily as needed for anxiety. 270 tablet 1   ARIPiprazole  (ABILIFY ) 10 MG tablet Take 1 tablet (10 mg total) by mouth at bedtime. 90 tablet 2   Ascorbic Acid  500 MG CHEW Take 500 mg by mouth daily.     Blood Glucose Monitoring Suppl (FREESTYLE LITE) DEVI Use to measure glucose 4 times a day 1 each 0   CALCIUM -VITAMIN D -VITAMIN K PO Take 1 tablet by mouth daily. Chewable     Continuous Glucose Receiver (FREESTYLE LIBRE 3 READER) DEVI 1 Piece by Does not apply route once as needed for up to 1 dose. 1 each 0   Continuous Glucose Sensor (FREESTYLE LIBRE 3 SENSOR) MISC 1 Piece by Does not apply route every 14 (fourteen) days. Place 1 sensor on the skin every 14 days. Use to check glucose continuously 2 each 3   cycloSPORINE  (RESTASIS ) 0.05 % ophthalmic emulsion Place 2 drops into both eyes 2 (two) times daily.      diltiazem  (CARDIZEM  SR) 60 MG 12 hr capsule Take 1 capsule (60 mg total) by mouth every 8 (eight) hours as needed (Palpitations). (Patient not taking: Reported on 08/31/2023) 90 capsule 1   escitalopram  (LEXAPRO ) 20 MG tablet Take 1 tablet (20 mg total) by mouth daily. 90 tablet 2   fluticasone  (FLONASE )  50 MCG/ACT nasal spray Place 2 sprays into the nose daily as needed for allergies.      gabapentin  (NEURONTIN ) 100 MG capsule Take 1 capsule (100  mg total) by mouth 2 (two) times daily. 180 capsule 1   glucose blood test strip Test 4 times a day, she has freestyle lite meter 150 each 2   Insulin  Pen Needle (B-D ULTRAFINE III SHORT PEN) 31G X 8 MM MISC 1 each by Does not apply route as directed. 100 each 3   linaclotide  (LINZESS ) 145 MCG CAPS capsule Take 1 capsule (145 mcg total) by mouth daily before breakfast. 90 capsule 1   loratadine  (CLARITIN ) 10 MG tablet Take 10 mg by mouth daily.     Menthol , Topical Analgesic, (BENGAY EX) Apply 1 Application topically daily as needed (pain).     metFORMIN  (GLUCOPHAGE -XR) 500 MG 24 hr tablet TAKE 1 TABLET DAILY WITH BREAKFAST 90 tablet 3   Multiple Vitamin (MULTIVITAMIN WITH MINERALS) TABS tablet Take 1 tablet by mouth daily. celebrate multivitamin 2 in 1     Multiple Vitamins-Minerals (IMMUNE SYSTEM BOOSTER PO) Take 1 tablet by mouth daily.     NON FORMULARY Pt uses a cpap nightly     omeprazole (PRILOSEC) 20 MG capsule Take 20 mg by mouth daily.     polyethylene glycol (MIRALAX  / GLYCOLAX ) 17 g packet Take 17 g by mouth daily.     Probiotic Product (PROBIOTIC PO) Take 1 tablet by mouth daily.     rizatriptan  (MAXALT -MLT) 10 MG disintegrating tablet Take 10 mg by mouth as needed for migraine. May repeat in 2 hours if needed     Semaglutide , 2 MG/DOSE, 8 MG/3ML SOPN Inject 2 mg as directed once a week. 9 mL 1   temazepam  (RESTORIL ) 30 MG capsule Take 1 capsule (30 mg total) by mouth at bedtime. 30 capsule 2   No current facility-administered medications for this visit.     Musculoskeletal: Strength & Muscle Tone: within normal limits Gait & Station: normal Patient leans: N/A  Psychiatric Specialty Exam: Review of Systems  Gastrointestinal:  Positive for constipation.  All other systems reviewed and are negative.   There were no vitals taken for this visit.There is no height or weight on file to calculate BMI.  General Appearance: Casual and Fairly Groomed  Eye Contact:  Good  Speech:   Clear and Coherent  Volume:  Normal  Mood:  Euthymic  Affect:  Congruent  Thought Process:  Goal Directed  Orientation:  Full (Time, Place, and Person)  Thought Content: WDL   Suicidal Thoughts:  No  Homicidal Thoughts:  No  Memory:  Immediate;   Good Recent;   Good Remote;   Fair  Judgement:  Good  Insight:  Good  Psychomotor Activity:  Normal  Concentration:  Concentration: Good and Attention Span: Good  Recall:  Good  Fund of Knowledge: Good  Language: Good  Akathisia:  No  Handed:  Right  AIMS (if indicated): not done  Assets:  Communication Skills Desire for Improvement Physical Health Resilience Social Support Talents/Skills  ADL's:  Intact  Cognition: WNL  Sleep:  Fair   Screenings: Insurance account manager from 02/02/2023 in [facility] [place] Video Visit from 02/17/2022 in [facility] at [place] Video Visit from 11/20/2021 in [facility] at [place] Video Visit from 08/12/2021 in [facility] at [place] Video Visit from 05/14/2021 in [facility] at [facility] Total Score 6 0 0 0 1  PHQ-9 Total Score 20 -- -- -- --      Flowsheet Row Counselor from 02/02/2023 in [facility] at [place] ED from 08/06/2022 in [facility] Department at [facility] Video Visit from 02/17/2022 in [facility] at [place]  C-SSRS RISK CATEGORY No Risk No Risk No Risk        Assessment and Plan: This patient is a 63 year old female with a history of bipolar disorder.  She continues to do well on her current regimen.  She will continue Lexapro  20 mg daily for depression, Abilify  10 mg at bedtime for mood stabilization, Xanax  1 mg 3 times daily for anxiety and temazepam  30 mg at bedtime for sleep.  She will return to see me in 3  months  Collaboration of Care: Collaboration of Care: Referral or follow-up with counselor/therapist AEB patient will continue therapy with Fayne Hoover in our office  Patient/Guardian was advised Release of Information must be obtained prior to any record release in order to collaborate their care with an outside provider. Patient/Guardian was advised if they have not already done so to contact the registration department to sign all necessary forms in order for us  to release information regarding their care.   Consent: Patient/Guardian gives verbal consent for treatment and assignment of benefits for services provided during this visit. Patient/Guardian expressed understanding and agreed to proceed.    Alfredia Annas, MD 09/01/2023, 1:09 PM

## 2023-09-13 ENCOUNTER — Ambulatory Visit (HOSPITAL_COMMUNITY): Admitting: Psychiatry

## 2023-09-27 ENCOUNTER — Ambulatory Visit (HOSPITAL_COMMUNITY): Admitting: Psychiatry

## 2023-10-05 ENCOUNTER — Ambulatory Visit: Payer: Medicare Other | Admitting: "Endocrinology

## 2023-10-11 ENCOUNTER — Ambulatory Visit (HOSPITAL_COMMUNITY): Admitting: Psychiatry

## 2023-10-16 ENCOUNTER — Other Ambulatory Visit: Payer: Self-pay | Admitting: "Endocrinology

## 2023-10-17 ENCOUNTER — Ambulatory Visit (INDEPENDENT_AMBULATORY_CARE_PROVIDER_SITE_OTHER): Payer: Medicare Other | Admitting: "Endocrinology

## 2023-10-17 ENCOUNTER — Encounter: Payer: Self-pay | Admitting: "Endocrinology

## 2023-10-17 VITALS — BP 134/84 | HR 88 | Ht 67.0 in | Wt 250.0 lb

## 2023-10-17 DIAGNOSIS — E782 Mixed hyperlipidemia: Secondary | ICD-10-CM | POA: Diagnosis not present

## 2023-10-17 DIAGNOSIS — E559 Vitamin D deficiency, unspecified: Secondary | ICD-10-CM

## 2023-10-17 DIAGNOSIS — Z7985 Long-term (current) use of injectable non-insulin antidiabetic drugs: Secondary | ICD-10-CM

## 2023-10-17 DIAGNOSIS — I10 Essential (primary) hypertension: Secondary | ICD-10-CM

## 2023-10-17 DIAGNOSIS — E1165 Type 2 diabetes mellitus with hyperglycemia: Secondary | ICD-10-CM

## 2023-10-17 LAB — POCT GLYCOSYLATED HEMOGLOBIN (HGB A1C): HbA1c, POC (controlled diabetic range): 6.2 % (ref 0.0–7.0)

## 2023-10-17 MED ORDER — METFORMIN HCL ER 500 MG PO TB24: 500.0000 mg | ORAL_TABLET | Freq: Every day | ORAL | 3 refills | Status: DC

## 2023-10-17 MED ORDER — OZEMPIC (2 MG/DOSE) 8 MG/3ML ~~LOC~~ SOPN: 2.0000 mg | PEN_INJECTOR | SUBCUTANEOUS | 3 refills | Status: DC

## 2023-10-17 NOTE — Progress Notes (Signed)
 10/17/2023, 1:19 PM                                     Endocrinology follow-up note   Subjective:    Patient ID: [name], female    DOB: 28-Dec-1960.  YUNIQUE DEARCOS is being seen in follow up after she was seen in consultation for management of currently uncontrolled symptomatic diabetes requested by  Minus Amel, MD.   Past Medical History:  Diagnosis Date   Adenomatous polyp 2009   Anxiety    Arthritis    Bipolar 1 disorder (HCC)    Constipation    Depression    Diabetes mellitus    Diabetes mellitus, type II (HCC)    Diverticula of colon 2009   Generalized headaches    GERD (gastroesophageal reflux disease)    History of hiatal hernia    small   Hypertension    no meds   Migraine    Neuropathy    both feet   Obesity    Pelvic floor dysfunction    abnormal anorectal manometry at [facility] in 2009   Plantar fasciitis both feet   Sleep apnea    cpap setting of 3.5    Past Surgical History:  Procedure Laterality Date   ABDOMINAL HYSTERECTOMY     compete   CATARACT EXTRACTION W/PHACO Right 02/15/2017   Procedure: CATARACT EXTRACTION PHACO AND INTRAOCULAR LENS PLACEMENT (IOC);  Surgeon: Albert Huff, MD;  Location: [facility];  Service: Ophthalmology;  Laterality: Right;  CDE: 4.18   CATARACT EXTRACTION W/PHACO Left 03/01/2017   Procedure: CATARACT EXTRACTION PHACO AND INTRAOCULAR LENS PLACEMENT (IOC);  Surgeon: Albert Huff, MD;  Location: [facility];  Service: Ophthalmology;  Laterality: Left;  CDE: 2.56   COLON RESECTION  03/30/2002   with end-colostomy and Hartmann's pouch   COLON SURGERY     complicated diverticulitis requiring sigmoid resection with colostomy and subsequent takedown   COLONOSCOPY  12/11/2007   Dr. Riley Cheadle- marginal prep, normal rectum pancolonic diverticula, adenomatous polyp   COLONOSCOPY  08/28/2003    Wide open colonic anastomosis/Scattered diverticula noted throughout colon/ Small external hemorrhoids    COLONOSCOPY  04/2012   [facility]: hyperplastic polyps, diverticulosis, ileocolonic anastomosis.   COLONOSCOPY WITH PROPOFOL  N/A 06/07/2016   Sigmoid diverticulosis, s/p prior segmental resection. 5 year surveillance. Personal history of polyps in past.   COLONOSCOPY WITH PROPOFOL  N/A 12/23/2021   Procedure: COLONOSCOPY WITH PROPOFOL ;  Surgeon: Suzette Espy, MD;  Location: [facility];  Service: Endoscopy;  Laterality: N/A;  2:45pm, asa 3, knows new arrival time per Mindy   COLOSTOMY CLOSURE  07/10/2002   ESOPHAGOGASTRODUODENOSCOPY N/A 01/08/2020   Procedure: UPPER GASTROINTESTINAL ENDOSCOPY;  Surgeon: Juanita Norlander, MD;  Location: [facility];  Service: General;  Laterality: N/A;   ESOPHAGOGASTRODUODENOSCOPY (EGD) WITH PROPOFOL  N/A 08/23/2016   Procedure: ESOPHAGOGASTRODUODENOSCOPY (EGD) WITH PROPOFOL ;  Surgeon: Juanita Norlander, MD;  Location: [facility];  Service: General;  Laterality: N/A;   FLEXIBLE SIGMOIDOSCOPY  01/20/2012   [facility]: incomplete/attempted colonoscopy. Inadequate prep precluded examination   HEEL SPUR SURGERY Left 09/11/2013   both have been done   KNEE ARTHROSCOPY  02/04/2004    left knee/partial medial meniscectomy.   KNEE SURGERY     3 arthroscopic  2 on left 1 on right   LAPAROSCOPIC GASTRIC BANDING  2009   LAPAROSCOPIC GASTRIC SLEEVE RESECTION N/A 01/08/2020  Procedure: LAPAROSCOPIC SLEEVE GASTRECTOMY;  Surgeon: Juanita Norlander, MD;  Location: [facility];  Service: General;  Laterality: N/A;   LAPAROSCOPIC SALPINGOOPHERECTOMY  03/30/2002   POLYPECTOMY  12/23/2021   Procedure: POLYPECTOMY;  Surgeon: Suzette Espy, MD;  Location: [facility];  Service: Endoscopy;;   TOTAL KNEE ARTHROPLASTY Left 03/27/2021   Procedure: TOTAL KNEE ARTHROPLASTY;  Surgeon: Winston Hawking, MD;  Location: [facility];  Service: Orthopedics;  Laterality: Left;   TOTAL KNEE ARTHROPLASTY Right 07/10/2021   Procedure: TOTAL KNEE ARTHROPLASTY;  Surgeon: Winston Hawking, MD;  Location: [facility];  Service: Orthopedics;   Laterality: Right;   TUBAL LIGATION      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    Employer: DISABLED  Tobacco Use   Smoking status: Never    Passive exposure: Never   Smokeless tobacco: Never  Vaping Use   Vaping status: Never Used  Substance and Sexual Activity   Alcohol  use: Not Currently   Drug use: Never   Sexual activity: Not Currently    Birth control/protection: Surgical  Other Topics Concern   Not on file  Social History Narrative   Not on file   Social Drivers of Health   Financial Resource Strain: Not on file  Food Insecurity: No Food Insecurity (09/30/2022)   Hunger Vital Sign    Worried About Running Out of Food in the Last Year: Never true    Ran Out of Food in the Last Year: Never true  Transportation Needs: No Transportation Needs (09/30/2022)   PRAPARE - Administrator, Civil Service (Medical): No    Lack of Transportation (Non-Medical): No  Physical Activity: Not on file  Stress: Not on file  Social Connections: Not on file    Family History  Problem Relation Age of Onset   Ovarian cancer Mother    Heart disease Mother    Anxiety disorder Mother    Cirrhosis Father        deceased age 76   Alcohol  abuse Father    Anxiety disorder Sister    Dementia Brother    Dementia Maternal Grandfather    Liver cancer Cousin        age 37, deceased   Drug abuse Cousin    ADD / ADHD Son    Colon cancer Other        aunt, deceased age 3   Breast cancer Other        aunt, deceased age 6   Bipolar disorder Neg Hx    Depression Neg Hx    OCD Neg Hx    Paranoid behavior Neg Hx    Schizophrenia Neg Hx    Seizures Neg Hx    Sexual abuse Neg Hx    Physical abuse Neg Hx    Sleep apnea Neg Hx     Outpatient  Encounter Medications as of 10/17/2023  Medication Sig   ALPRAZolam  (XANAX ) 1 MG tablet Take 1 tablet (1 mg total) by mouth 3 (three) times daily as needed for anxiety.   ARIPiprazole  (ABILIFY ) 10 MG tablet Take 1 tablet (10 mg total) by mouth at bedtime.   Ascorbic Acid  500 MG CHEW Take 500 mg by mouth daily.   Blood Glucose  Monitoring Suppl (FREESTYLE LITE) DEVI Use to measure glucose 4 times a day   CALCIUM -VITAMIN D -VITAMIN K PO Take 1 tablet by mouth daily. Chewable   Continuous Glucose Receiver (FREESTYLE LIBRE 3 READER) DEVI 1 Piece by Does not apply route once as needed for up to 1 dose.   Continuous Glucose Sensor (FREESTYLE LIBRE 3 SENSOR) MISC 1 Piece by Does not apply route every 14 (fourteen) days. Place 1 sensor on the skin every 14 days. Use to check glucose continuously   cycloSPORINE  (RESTASIS ) 0.05 % ophthalmic emulsion Place 2 drops into both eyes 2 (two) times daily.    diltiazem  (CARDIZEM  SR) 60 MG 12 hr capsule Take 1 capsule (60 mg total) by mouth every 8 (eight) hours as needed (Palpitations). (Patient not taking: Reported on 08/31/2023)   escitalopram  (LEXAPRO ) 20 MG tablet Take 1 tablet (20 mg total) by mouth daily.   fluticasone  (FLONASE ) 50 MCG/ACT nasal spray Place 2 sprays into the nose daily as needed for allergies.    gabapentin  (NEURONTIN ) 100 MG capsule Take 1 capsule (100 mg total) by mouth 2 (two) times daily.   glucose blood test strip Test 4 times a day, she has freestyle lite meter   Insulin  Pen Needle (B-D ULTRAFINE III SHORT PEN) 31G X 8 MM MISC 1 each by Does not apply route as directed.   linaclotide  (LINZESS ) 145 MCG CAPS capsule Take 1 capsule (145 mcg total) by mouth daily before breakfast.   loratadine  (CLARITIN ) 10 MG tablet Take 10 mg by mouth daily.   Menthol , Topical Analgesic, (BENGAY EX) Apply 1 Application topically daily as needed (pain).   metFORMIN  (GLUCOPHAGE -XR) 500 MG 24 hr tablet Take 1 tablet (500 mg total) by mouth daily with breakfast.    Multiple Vitamin (MULTIVITAMIN WITH MINERALS) TABS tablet Take 1 tablet by mouth daily. celebrate multivitamin 2 in 1   Multiple Vitamins-Minerals (IMMUNE SYSTEM BOOSTER PO) Take 1 tablet by mouth daily.   NON FORMULARY Pt uses a cpap nightly   omeprazole (PRILOSEC) 20 MG capsule Take 20 mg by mouth daily.   polyethylene glycol (MIRALAX  / GLYCOLAX ) 17 g packet Take 17 g by mouth daily.   Probiotic Product (PROBIOTIC PO) Take 1 tablet by mouth daily.   rizatriptan  (MAXALT -MLT) 10 MG disintegrating tablet Take 10 mg by mouth as needed for migraine. May repeat in 2 hours if needed   Semaglutide , 2 MG/DOSE, (OZEMPIC , 2 MG/DOSE,) 8 MG/3ML SOPN Inject 2 mg into the skin once a week.   temazepam  (RESTORIL ) 30 MG capsule Take 1 capsule (30 mg total) by mouth at bedtime.   [DISCONTINUED] metFORMIN  (GLUCOPHAGE -XR) 500 MG 24 hr tablet TAKE 1 TABLET DAILY WITH BREAKFAST   [DISCONTINUED] OZEMPIC , 2 MG/DOSE, 8 MG/3ML SOPN INJECT 2 MG AS DIRECTED ONCE A WEEK   No facility-administered encounter medications on file as of 10/17/2023.    ALLERGIES: Allergies  Allergen Reactions   Bactrim Itching, Nausea And Vomiting and Rash    Bactrim brand name. Redness.    VACCINATION STATUS: Immunization History  Administered Date(s) Administered   Moderna Sars-Covid-2 Vaccination 07/21/2019, 08/22/2019   Pneumococcal Polysaccharide-23 01/09/2020    Diabetes She presents for her follow-up diabetic visit. She has type 2 diabetes mellitus. Onset time: She was diagnosed at approximate age of 44 years. Her disease course has been improving (She underwent sleeve gastrectomy on January 08, 2020, and she has lost 14 pounds since her surgery.). There are no hypoglycemic associated symptoms. Pertinent negatives for hypoglycemia include no confusion, headaches,  pallor or seizures. Associated symptoms include fatigue. Pertinent negatives for diabetes include no chest pain, no polydipsia, no polyphagia and no polyuria. There are  no hypoglycemic complications. Symptoms are improving. Diabetic complications include heart disease. Risk factors for coronary artery disease include dyslipidemia, family history, diabetes mellitus, hypertension, obesity, sedentary lifestyle and post-menopausal. Current diabetic treatments: She is currently on Ozempic  and metformin . Her weight is decreasing steadily. She is following a generally unhealthy diet. When asked about meal planning, she reported none. She has not had a previous visit with a dietitian. She never participates in exercise. Her home blood glucose trend is decreasing steadily. Her breakfast blood glucose range is generally 130-140 mg/dl. Her lunch blood glucose range is generally 130-140 mg/dl. Her dinner blood glucose range is generally 130-140 mg/dl. Her bedtime blood glucose range is generally 130-140 mg/dl. Her overall blood glucose range is 130-140 mg/dl. Gehling presents with her freestyle Libre device showing average blood glucose of 124 mg/dl for  the last 14 days.   Her AGP report shows 99% time in range, 1% level 1 hyperglycemia.  She has no hypoglycemia.  Her point-of-care A1c is 6.2% improving from 7.2%.   This patient underwent bariatric surgery twice-gastric banding in 2009, sleeve gastrectomy in 2021.  ) An ACE inhibitor/angiotensin II receptor blocker is being taken. Eye exam is current.  Hypertension This is a chronic problem. The current episode started more than 1 year ago. The problem is uncontrolled. Pertinent negatives include no chest pain, headaches, palpitations or shortness of breath. Risk factors for coronary artery disease include diabetes mellitus, obesity, sedentary lifestyle and post-menopausal state. Past treatments include ACE inhibitors.    Review of systems: Limited as above. Objective:    BP 134/84   Pulse 88   Ht 5\' 7"  (1.702 m)   Wt 250 lb (113.4 kg)   BMI 39.16 kg/m   Wt Readings from Last 3 Encounters:  10/17/23 250 lb (113.4 kg)   08/31/23 256 lb 9.6 oz (116.4 kg)  06/06/23 263 lb 3.2 oz (119.4 kg)     Physical Exam- Limited  Constitutional:  Body mass index is 39.16 kg/m. , not in acute distress, normal state of mind   CMP     Component Value Date/Time   NA 141 05/19/2023 1008   K 4.8 05/19/2023 1008   CL 99 05/19/2023 1008   CO2 25 05/19/2023 1008   GLUCOSE 113 (H) 05/19/2023 1008   GLUCOSE 104 (H) 08/06/2022 1233   BUN 24 05/19/2023 1008   CREATININE 0.82 05/19/2023 1008   CREATININE 0.74 03/23/2011 0400   CALCIUM  9.6 05/19/2023 1008   PROT 7.2 05/19/2023 1008   ALBUMIN 4.4 05/19/2023 1008   AST 17 05/19/2023 1008   ALT 15 05/19/2023 1008   ALKPHOS 92 05/19/2023 1008   BILITOT 0.3 05/19/2023 1008   GFRNONAA >60 08/06/2022 1233   GFRAA >60 01/01/2020 1356   Recent Results (from the past 2160 hours)  HgB A1c     Status: None   Collection Time: 10/17/23  1:13 PM  Result Value Ref Range   Hemoglobin A1C     HbA1c POC (<> result, manual entry)     HbA1c, POC (prediabetic range)     HbA1c, POC (controlled diabetic range) 6.2 0.0 - 7.0 %   Lipid Panel     Component Value Date/Time   CHOL 188 05/19/2023 1008   TRIG 125 05/19/2023 1008   HDL 62 05/19/2023 1008   CHOLHDL 3.0 05/19/2023 1008   LDLCALC  104 (H) 05/19/2023 1008   LABVLDL 22 05/19/2023 1008     Assessment & Plan:   1. Uncontrolled type 2 diabetes mellitus with hyperglycemia (HCC)  - NORVA BOWE has currently uncontrolled symptomatic type 2 DM since  63 years of age.  Shae presents with her freestyle Libre device showing average blood glucose of 124 mg/dl for  the last 14 days.   Her AGP report shows 99% time in range, 1% level 1 hyperglycemia.  She has no hypoglycemia.  Her point-of-care A1c is 6.2% improving from 7.2%.   This patient underwent bariatric surgery twice-gastric banding in 2009, sleeve gastrectomy in 2021.  - Recent labs reviewed. - I had a long discussion with her about the progressive nature of  diabetes and the pathology behind its complications. -her diabetes is complicated by morbid obesity, sedentary life, hypertension and she remains at a high risk for more acute and chronic complications which include CAD, CVA, CKD, retinopathy, and neuropathy. These are all discussed in detail with her.  - I have counseled her on diet  and weight management  by adopting a carbohydrate restricted/protein rich diet. Patient is encouraged to switch to  unprocessed or minimally processed     complex starch and increased protein intake (animal or plant source), fruits, and vegetables. -  she is advised to stick to a routine mealtimes to eat 3 meals  a day and avoid unnecessary snacks ( to snack only to correct hypoglycemia).   In light of the fact that she underwent bariatric surgery twice,  she is approached for intensive therapeutic lifestyle change.  She would benefit the most from lifestyle medicine.  - she acknowledges that there is a room for improvement in her food and drink choices. - Suggestion is made for her to avoid simple carbohydrates  from her diet including Cakes, Sweet Desserts, Ice Cream, Soda (diet and regular), Sweet Tea, Candies, Chips, Cookies, Store Bought Juices, Alcohol  , Artificial Sweeteners,  Coffee Creamer, and "Sugar-free" Products, Lemonade. This will help patient to have more stable blood glucose profile and potentially avoid unintended weight gain.  The following Lifestyle Medicine recommendations according to American College of Lifestyle Medicine  [facility]) were discussed and and offered to patient and she  agrees to start the journey:  A. Whole Foods, Plant-Based Nutrition comprising of fruits and vegetables, plant-based proteins, whole-grain carbohydrates was discussed in detail with the patient.   A list for source of those nutrients were also provided to the patient.  Patient will use only water  or unsweetened tea for hydration. B.  The need to stay away from risky  substances including alcohol , smoking; obtaining 7 to 9 hours of restorative sleep, at least 150 minutes of moderate intensity exercise weekly, the importance of healthy social connections,  and stress management techniques were discussed. C.  A full color page of  Calorie density of various food groups per pound showing examples of each food groups was provided to the patient.   - I have approached her with the following individualized plan to manage  her diabetes and patient agrees:   Her point-of-care A1c is 6.2% improving from 7.2%.  She will continue to benefit from GLP-1 receptor agonist.  She is advised to continue Ozempic  2 mg subcutaneously weekly.   Side effects and precautions discussed with her.  She is also advised to continue metformin  500 mg p.o. daily at breakfast.   She will continue to benefit from her CGM, advised to wear it at all  times.  - Specific targets for  A1c;  LDL, HDL,  and Triglycerides were discussed with the patient.   2) Blood Pressure /Hypertension: Her blood pressure is controlled to target.   she is advised to continue her current medications including lisinopril  5 mg p.o. daily with breakfast , lisinopril  will be increased to  10 mg for next refill.  3) Lipids/Hyperlipidemia:     -Her recent lipid panel showed improved LDL at 104 from 111.  She is not on statins, will be considered on subsequent visit.    4)  Weight/Diet: Her BMI is 39.16, presents with 13 pounds of weight loss since her last visit.  She is status post 80 pounds of weight loss overall by the result of sleeve gastrectomy.    She is still a good candidate for modest weight loss. See lifestyle medicine recommendations above.  5) Chronic Care/Health Maintenance:  -she  is on ACEI/ARB and  is encouraged to initiate and continue to follow up with Ophthalmology, Dentist,  Podiatrist at least yearly or according to recommendations, and advised to  stay away from smoking. I have recommended yearly  flu vaccine and pneumonia vaccine at least every 5 years; moderate intensity exercise for up to 150 minutes weekly; and  sleep for at least 7 hours a day.  Her screening ABI was normal in December 2022, This study will be repeated in 5 years-December 2026, or sooner if needed.  - she is  advised to maintain close follow up with Minus Amel, MD for primary care needs, as well as her other providers for optimal and coordinated care.   I spent  30 minutes in the care of the patient today including review of labs from CMP, Lipids, Thyroid  Function, Hematology (current and previous including abstractions from other facilities); face-to-face time discussing  her blood glucose readings/logs, discussing hypoglycemia and hyperglycemia episodes and symptoms, medications doses, her options of short and long term treatment based on the latest standards of care / guidelines;  discussion about incorporating lifestyle medicine;  and documenting the encounter. Risk reduction counseling performed per USPSTF guidelines to reduce  obesity and cardiovascular risk factors.     Please refer to Patient Instructions for Blood Glucose Monitoring and Insulin /Medications Dosing Guide"  in media tab for additional information. Please  also refer to " Patient Self Inventory" in the Media  tab for reviewed elements of pertinent patient history.  Erika Haver participated in the discussions, expressed understanding, and voiced agreement with the above plans.  All questions were answered to her satisfaction. she is encouraged to contact clinic should she have any questions or concerns prior to her return visit.   Follow up plan: - Return in about 4 months (around 02/16/2024) for F/U with Pre-visit Labs, Meter/CGM/Logs, A1c here.  Kalvin Orf, MD [facility] [address] [place], [place] [address] Phone: [phone]  Fax: [phone]    10/17/2023, 1:19 PM  This  note was partially dictated with voice recognition software. Similar sounding words can be transcribed inadequately or may not  be corrected upon review.

## 2023-10-17 NOTE — Patient Instructions (Signed)

## 2023-11-03 DIAGNOSIS — M549 Dorsalgia, unspecified: Secondary | ICD-10-CM | POA: Diagnosis not present

## 2023-11-03 DIAGNOSIS — Z0001 Encounter for general adult medical examination with abnormal findings: Secondary | ICD-10-CM | POA: Diagnosis not present

## 2023-11-03 DIAGNOSIS — I10 Essential (primary) hypertension: Secondary | ICD-10-CM | POA: Diagnosis not present

## 2023-11-03 DIAGNOSIS — E782 Mixed hyperlipidemia: Secondary | ICD-10-CM | POA: Diagnosis not present

## 2023-11-03 DIAGNOSIS — E1165 Type 2 diabetes mellitus with hyperglycemia: Secondary | ICD-10-CM | POA: Diagnosis not present

## 2023-11-16 ENCOUNTER — Ambulatory Visit (HOSPITAL_COMMUNITY): Admitting: Psychiatry

## 2023-11-29 NOTE — Progress Notes (Unsigned)
 Referring Provider: Marvine Rush, MD Primary Care Physician:  Marvine Rush, MD Primary GI Physician: Dr. Shaaron  Chief Complaint  Patient presents with   Follow-up    Follow up. Still having problems with constipation     HPI:   [name] is a 63 y.o. female with history of GERD, chronic constipation, complicated diverticulitis in the remote past requiring surgery, adenomatous colon polyps due for surveillance colonoscopy in 2028, presenting today  with chief complaint of constipation.   Last seen in the office 08/31/2023.  Noted worsening constipation in the setting of Ozempic , not controlled with Linzess  72 mcg daily and MiraLAX  daily.  Associated lower abdominal pain that improves with a bowel movement.  Recommended increasing Linzess  to 145 mcg daily and could increase further if needed.   Today:  Bowels moving 1-2 times a week. Taking Lizness 145 mcg and MiraLAX  17 g daily. Sunday, had to take stool softener, magnesium citrate, Linzess  290 mcg and an edema to have a bowel movement. Drinks about 40 oz of water  per day. Doesn't eat a lot of vegetables.   No abdominal pain, brbpr, or melena. No nausea or vomiting.    Last Colonoscopy 12/23/21: Diverticulosis in the entire examined colon, surgical anastomosis at 20 cm, two 5-6 mm polyps removed, nonbleeding internal hemorrhoids.  Pathology with tubular adenomas.  Recommended repeat in 5 years.   Past Medical History:  Diagnosis Date   Adenomatous polyp 2009   Anxiety    Arthritis    Bipolar 1 disorder (HCC)    Constipation    Depression    Diabetes mellitus    Diabetes mellitus, type II (HCC)    Diverticula of colon 2009   Generalized headaches    GERD (gastroesophageal reflux disease)    History of hiatal hernia    small   Hypertension    no meds   Migraine    Neuropathy    both feet   Obesity    Pelvic floor dysfunction    abnormal anorectal manometry at [facility] in 2009   Plantar fasciitis both feet   Sleep  apnea    cpap setting of 3.5    Past Surgical History:  Procedure Laterality Date   ABDOMINAL HYSTERECTOMY     compete   CATARACT EXTRACTION W/PHACO Right 02/15/2017   Procedure: CATARACT EXTRACTION PHACO AND INTRAOCULAR LENS PLACEMENT (IOC);  Surgeon: Roz Anes, MD;  Location: [facility];  Service: Ophthalmology;  Laterality: Right;  CDE: 4.18   CATARACT EXTRACTION W/PHACO Left 03/01/2017   Procedure: CATARACT EXTRACTION PHACO AND INTRAOCULAR LENS PLACEMENT (IOC);  Surgeon: Roz Anes, MD;  Location: [facility];  Service: Ophthalmology;  Laterality: Left;  CDE: 2.56   COLON RESECTION  03/30/2002   with end-colostomy and Hartmann's pouch   COLON SURGERY     complicated diverticulitis requiring sigmoid resection with colostomy and subsequent takedown   COLONOSCOPY  12/11/2007   Dr. Shaaron- marginal prep, normal rectum pancolonic diverticula, adenomatous polyp   COLONOSCOPY  08/28/2003    Wide open colonic anastomosis/Scattered diverticula noted throughout colon/ Small external hemorrhoids   COLONOSCOPY  04/2012   [facility]: hyperplastic polyps, diverticulosis, ileocolonic anastomosis.   COLONOSCOPY WITH PROPOFOL  N/A 06/07/2016   Sigmoid diverticulosis, s/p prior segmental resection. 5 year surveillance. Personal history of polyps in past.   COLONOSCOPY WITH PROPOFOL  N/A 12/23/2021   Procedure: COLONOSCOPY WITH PROPOFOL ;  Surgeon: Shaaron Lamar HERO, MD;  Location: [facility];  Service: Endoscopy;  Laterality: N/A;  2:45pm, asa 3,  knows new arrival time per Mindy   COLOSTOMY CLOSURE  07/10/2002   ESOPHAGOGASTRODUODENOSCOPY N/A 01/08/2020   Procedure: UPPER GASTROINTESTINAL ENDOSCOPY;  Surgeon: Ethyl Lenis, MD;  Location: [facility];  Service: General;  Laterality: N/A;   ESOPHAGOGASTRODUODENOSCOPY (EGD) WITH PROPOFOL  N/A 08/23/2016   Procedure: ESOPHAGOGASTRODUODENOSCOPY (EGD) WITH PROPOFOL ;  Surgeon: Lenis Ethyl, MD;  Location: [facility];  Service: General;  Laterality: N/A;   FLEXIBLE  SIGMOIDOSCOPY  01/20/2012   RMR: incomplete/attempted colonoscopy. Inadequate prep precluded examination   HEEL SPUR SURGERY Left 09/11/2013   both have been done   KNEE ARTHROSCOPY  02/04/2004    left knee/partial medial meniscectomy.   KNEE SURGERY     3 arthroscopic  2 on left 1 on right   LAPAROSCOPIC GASTRIC BANDING  2009   LAPAROSCOPIC GASTRIC SLEEVE RESECTION N/A 01/08/2020   Procedure: LAPAROSCOPIC SLEEVE GASTRECTOMY;  Surgeon: Ethyl Lenis, MD;  Location: [facility];  Service: General;  Laterality: N/A;   LAPAROSCOPIC SALPINGOOPHERECTOMY  03/30/2002   POLYPECTOMY  12/23/2021   Procedure: POLYPECTOMY;  Surgeon: Shaaron Lamar HERO, MD;  Location: [facility];  Service: Endoscopy;;   TOTAL KNEE ARTHROPLASTY Left 03/27/2021   Procedure: TOTAL KNEE ARTHROPLASTY;  Surgeon: Kay Kemps, MD;  Location: [facility];  Service: Orthopedics;  Laterality: Left;   TOTAL KNEE ARTHROPLASTY Right 07/10/2021   Procedure: TOTAL KNEE ARTHROPLASTY;  Surgeon: Kay Kemps, MD;  Location: [facility];  Service: Orthopedics;  Laterality: Right;   TUBAL LIGATION      Current Outpatient Medications  Medication Sig Dispense Refill   ALPRAZolam  (XANAX ) 1 MG tablet Take 1 tablet (1 mg total) by mouth 3 (three) times daily as needed for anxiety. 270 tablet 1   ARIPiprazole  (ABILIFY ) 10 MG tablet Take 1 tablet (10 mg total) by mouth at bedtime. 90 tablet 2   Ascorbic Acid  500 MG CHEW Take 500 mg by mouth daily.     Blood Glucose Monitoring Suppl (FREESTYLE LITE) DEVI Use to measure glucose 4 times a day 1 each 0   CALCIUM -VITAMIN D -VITAMIN K PO Take 1 tablet by mouth daily. Chewable     Continuous Glucose Receiver (FREESTYLE LIBRE 3 READER) DEVI 1 Piece by Does not apply route once as needed for up to 1 dose. 1 each 0   Continuous Glucose Sensor (FREESTYLE LIBRE 3 SENSOR) MISC 1 Piece by Does not apply route every 14 (fourteen) days. Place 1 sensor on the skin every 14 days. Use to check glucose continuously 2 each 3    cycloSPORINE  (RESTASIS ) 0.05 % ophthalmic emulsion Place 2 drops into both eyes 2 (two) times daily.      escitalopram  (LEXAPRO ) 20 MG tablet Take 1 tablet (20 mg total) by mouth daily. 90 tablet 2   fluticasone  (FLONASE ) 50 MCG/ACT nasal spray Place 2 sprays into the nose daily as needed for allergies.      gabapentin  (NEURONTIN ) 100 MG capsule Take 1 capsule (100 mg total) by mouth 2 (two) times daily. 180 capsule 1   glucose blood test strip Test 4 times a day, she has freestyle lite meter 150 each 2   Insulin  Pen Needle (B-D ULTRAFINE III SHORT PEN) 31G X 8 MM MISC 1 each by Does not apply route as directed. 100 each 3   linaclotide  (LINZESS ) 290 MCG CAPS capsule Take 1 capsule (290 mcg total) by mouth daily before breakfast. 30 capsule 3   loratadine  (CLARITIN ) 10 MG tablet Take 10 mg by mouth daily.     Menthol , Topical Analgesic, (  BENGAY EX) Apply 1 Application topically daily as needed (pain).     metFORMIN  (GLUCOPHAGE -XR) 500 MG 24 hr tablet Take 1 tablet (500 mg total) by mouth daily with breakfast. 90 tablet 3   Multiple Vitamin (MULTIVITAMIN WITH MINERALS) TABS tablet Take 1 tablet by mouth daily. celebrate multivitamin 2 in 1     Multiple Vitamins-Minerals (IMMUNE SYSTEM BOOSTER PO) Take 1 tablet by mouth daily.     NON FORMULARY Pt uses a cpap nightly     omeprazole (PRILOSEC) 20 MG capsule Take 20 mg by mouth daily.     polyethylene glycol (MIRALAX  / GLYCOLAX ) 17 g packet Take 17 g by mouth daily.     Probiotic Product (PROBIOTIC PO) Take 1 tablet by mouth daily.     rizatriptan  (MAXALT -MLT) 10 MG disintegrating tablet Take 10 mg by mouth as needed for migraine. May repeat in 2 hours if needed     Semaglutide , 2 MG/DOSE, (OZEMPIC , 2 MG/DOSE,) 8 MG/3ML SOPN Inject 2 mg into the skin once a week. 9 mL 3   temazepam  (RESTORIL ) 30 MG capsule Take 1 capsule (30 mg total) by mouth at bedtime. 30 capsule 2   diltiazem  (CARDIZEM  SR) 60 MG 12 hr capsule Take 1 capsule (60 mg total) by mouth  every 8 (eight) hours as needed (Palpitations). (Patient not taking: Reported on 11/30/2023) 90 capsule 1   No current facility-administered medications for this visit.    Allergies as of 11/30/2023 - Review Complete 11/30/2023  Allergen Reaction Noted   Bactrim Itching, Nausea And Vomiting, and Rash 01/09/2011    Family History  Problem Relation Age of Onset   Ovarian cancer Mother    Heart disease Mother    Anxiety disorder Mother    Cirrhosis Father        deceased age 64   Alcohol  abuse Father    Anxiety disorder Sister    Dementia Brother    Dementia Maternal Grandfather    Liver cancer Cousin        age 74, deceased   Drug abuse Cousin    ADD / ADHD Son    Colon cancer Other        aunt, deceased age 8   Breast cancer Other        aunt, deceased age 23   Bipolar disorder Neg Hx    Depression Neg Hx    OCD Neg Hx    Paranoid behavior Neg Hx    Schizophrenia Neg Hx    Seizures Neg Hx    Sexual abuse Neg Hx    Physical abuse Neg Hx    Sleep apnea Neg Hx     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    Employer: DISABLED  Tobacco Use   Smoking status: Never    Passive exposure: Never   Smokeless tobacco: Never  Vaping Use   Vaping status: Never Used  Substance and Sexual Activity   Alcohol  use: Not Currently   Drug use: Never   Sexual activity: Not Currently    Birth control/protection: Surgical  Other Topics Concern   Not on file  Social History Narrative   Not on file   Social Drivers of Health   Financial Resource Strain:  Not on file  Food Insecurity: No Food Insecurity (09/30/2022)   Hunger Vital Sign    Worried About Running Out of Food in the Last Year: Never true    Ran Out of Food in the Last Year: Never true   Transportation Needs: No Transportation Needs (09/30/2022)   PRAPARE - Administrator, Civil Service (Medical): No    Lack of Transportation (Non-Medical): No  Physical Activity: Not on file  Stress: Not on file  Social Connections: Not on file    Review of Systems: GI: See HPI Heme: See HPI  Physical Exam: BP 134/84 (BP Location: Right Arm, Patient Position: Sitting, Cuff Size: Large)   Pulse 80   Temp 97.9 F (36.6 C) (Temporal)   Ht 5' 7 (1.702 m)   Wt 252 lb 12.8 oz (114.7 kg)   BMI 39.59 kg/m  General:   Alert and oriented. No distress noted. Pleasant and cooperative.  Head:  Normocephalic and atraumatic. Eyes:  Conjuctiva clear without scleral icterus. Abdomen:  +BS, soft, non-tender and non-distended. No rebound or guarding. No HSM or masses noted. Msk:  Symmetrical without gross deformities. Normal posture. Extremities:  Without edema. Neurologic:  Alert and  oriented x4 Psych:  Normal mood and affect.    Assessment:  Chronic constipation:  Worsened in the setting of Ozempic , started in 2024.  No alarm symptoms.  Colonoscopy up-to-date in 2023, due for surveillance in 2028.  Symptoms not adequately controlled with Linzess  145 mcg daily and MiraLAX  daily.   Plan:  Increase Linzess  to 290 mcg daily. Continue MiraLAX  17 g daily.  Can increase to twice a day if needed. Goal of at least 64 ounces of water  daily. Increase fruit, vegetable, whole wheat consumption to maintain adequate dietary fiber intake. Follow-up in 4 weeks.   Josette Centers, PA-C [facility] 11/30/2023

## 2023-11-30 ENCOUNTER — Encounter: Payer: Self-pay | Admitting: Gastroenterology

## 2023-11-30 ENCOUNTER — Ambulatory Visit (INDEPENDENT_AMBULATORY_CARE_PROVIDER_SITE_OTHER): Admitting: Gastroenterology

## 2023-11-30 VITALS — BP 134/84 | HR 80 | Temp 97.9°F | Ht 67.0 in | Wt 252.8 lb

## 2023-11-30 DIAGNOSIS — K5909 Other constipation: Secondary | ICD-10-CM | POA: Diagnosis not present

## 2023-11-30 DIAGNOSIS — K5900 Constipation, unspecified: Secondary | ICD-10-CM

## 2023-11-30 MED ORDER — LINACLOTIDE 290 MCG PO CAPS: 290.0000 ug | ORAL_CAPSULE | Freq: Every day | ORAL | 3 refills | Status: DC

## 2023-11-30 NOTE — Patient Instructions (Signed)
 Increase Linzess  to 290 mcg daily.  You can take 2 Linzess  145 mcg capsules together until you run out.  I have sent a new prescription of Linzess  290 mcg to your pharmacy.  Continue taking MiraLAX  17 g daily.  You can increase this to twice a day if needed.  Try to drink only 64 ounces of water  daily.  Increase your daily fruit, vegetable, whole wheat consumption to maintain adequate dietary fiber intake.  I will see you back in 4 weeks or sooner if needed.  Josette Centers, PA-C [facility]

## 2023-12-01 ENCOUNTER — Telehealth (HOSPITAL_COMMUNITY): Admitting: Psychiatry

## 2023-12-01 ENCOUNTER — Encounter (HOSPITAL_COMMUNITY): Payer: Self-pay | Admitting: Psychiatry

## 2023-12-01 DIAGNOSIS — F3162 Bipolar disorder, current episode mixed, moderate: Secondary | ICD-10-CM | POA: Diagnosis not present

## 2023-12-01 MED ORDER — ESCITALOPRAM OXALATE 20 MG PO TABS: 20.0000 mg | ORAL_TABLET | Freq: Every day | ORAL | 2 refills | Status: DC

## 2023-12-01 MED ORDER — ARIPIPRAZOLE 10 MG PO TABS: 10.0000 mg | ORAL_TABLET | Freq: Every day | ORAL | 2 refills | Status: DC

## 2023-12-01 MED ORDER — TEMAZEPAM 30 MG PO CAPS: 30.0000 mg | ORAL_CAPSULE | Freq: Every day | ORAL | 2 refills | Status: DC

## 2023-12-01 MED ORDER — ALPRAZOLAM 1 MG PO TABS: 1.0000 mg | ORAL_TABLET | Freq: Three times a day (TID) | ORAL | 1 refills | Status: DC | PRN

## 2023-12-01 NOTE — Progress Notes (Signed)
 Virtual Visit via Video Note  I connected with [name] on 12/01/23 at  1:00 PM EDT by a video enabled telemedicine application and verified that I am speaking with the correct person using two identifiers.  Location: Patient: home Provider: office   I discussed the limitations of evaluation and management by telemedicine and the availability of in person appointments. The patient expressed understanding and agreed to proceed.      I discussed the assessment and treatment plan with the patient. The patient was provided an opportunity to ask questions and all were answered. The patient agreed with the plan and demonstrated an understanding of the instructions.   The patient was advised to call back or seek an in-person evaluation if the symptoms worsen or if the condition fails to improve as anticipated.  I provided 20 minutes of non-face-to-face time during this encounter.   Barnie Gull, MD  [facility] MD/PA/NP OP Progress Note  12/01/2023 1:15 PM [name]  MRN:  [id number]  Chief Complaint:  Chief Complaint  Patient presents with   Depression   Anxiety   Follow-up   HPI: This patient is a 63 year old widowed white female who lives alone in [place].  She has 2 grown sons who live outside the home.  The patient returns for follow-up after 3 months regarding her depression anxiety and mood swings.  She states that she has met a new man.  They have been dating for about a month.  She states that he is very attentive and easygoing and has been helping her a lot around the house.  Is not very serious at this point but she seems to be a lot happier and less lonely.  She continues to go to the gym.  She continues to  work on [employer] loss eating healthier and exercising at the [facility].  She denies significant depression anxiety or difficulty sleeping. Visit Diagnosis:    ICD-10-CM   1. Bipolar 1 disorder, mixed, moderate (HCC)  F31.62       Past Psychiatric History:  Long-term outpatient treatment  Past Medical History:  Past Medical History:  Diagnosis Date   Adenomatous polyp 2009   Anxiety    Arthritis    Bipolar 1 disorder (HCC)    Constipation    Depression    Diabetes mellitus    Diabetes mellitus, type II (HCC)    Diverticula of colon 2009   Generalized headaches    GERD (gastroesophageal reflux disease)    History of hiatal hernia    small   Hypertension    no meds   Migraine    Neuropathy    both feet   Obesity    Pelvic floor dysfunction    abnormal anorectal manometry at [facility] in 2009   Plantar fasciitis both feet   Sleep apnea    cpap setting of 3.5    Past Surgical History:  Procedure Laterality Date   ABDOMINAL HYSTERECTOMY     compete   CATARACT EXTRACTION W/PHACO Right 02/15/2017   Procedure: CATARACT EXTRACTION PHACO AND INTRAOCULAR LENS PLACEMENT (IOC);  Surgeon: Roz Anes, MD;  Location: [facility];  Service: Ophthalmology;  Laterality: Right;  CDE: 4.18   CATARACT EXTRACTION W/PHACO Left 03/01/2017   Procedure: CATARACT EXTRACTION PHACO AND INTRAOCULAR LENS PLACEMENT (IOC);  Surgeon: Roz Anes, MD;  Location: [facility];  Service: Ophthalmology;  Laterality: Left;  CDE: 2.56   COLON RESECTION  03/30/2002   with end-colostomy and Hartmann's pouch   COLON SURGERY  complicated diverticulitis requiring sigmoid resection with colostomy and subsequent takedown   COLONOSCOPY  12/11/2007   Dr. Shaaron- marginal prep, normal rectum pancolonic diverticula, adenomatous polyp   COLONOSCOPY  08/28/2003    Wide open colonic anastomosis/Scattered diverticula noted throughout colon/ Small external hemorrhoids   COLONOSCOPY  04/2012   [facility]: hyperplastic polyps, diverticulosis, ileocolonic anastomosis.   COLONOSCOPY WITH PROPOFOL  N/A 06/07/2016   Sigmoid diverticulosis, s/p prior segmental resection. 5 year surveillance. Personal history of polyps in past.   COLONOSCOPY WITH PROPOFOL  N/A 12/23/2021   Procedure: COLONOSCOPY WITH  PROPOFOL ;  Surgeon: Shaaron Lamar HERO, MD;  Location: [facility];  Service: Endoscopy;  Laterality: N/A;  2:45pm, asa 3, knows new arrival time per Mindy   COLOSTOMY CLOSURE  07/10/2002   ESOPHAGOGASTRODUODENOSCOPY N/A 01/08/2020   Procedure: UPPER GASTROINTESTINAL ENDOSCOPY;  Surgeon: Ethyl Lenis, MD;  Location: [facility];  Service: General;  Laterality: N/A;   ESOPHAGOGASTRODUODENOSCOPY (EGD) WITH PROPOFOL  N/A 08/23/2016   Procedure: ESOPHAGOGASTRODUODENOSCOPY (EGD) WITH PROPOFOL ;  Surgeon: Lenis Ethyl, MD;  Location: [facility];  Service: General;  Laterality: N/A;   FLEXIBLE SIGMOIDOSCOPY  01/20/2012   [facility]: incomplete/attempted colonoscopy. Inadequate prep precluded examination   HEEL SPUR SURGERY Left 09/11/2013   both have been done   KNEE ARTHROSCOPY  02/04/2004    left knee/partial medial meniscectomy.   KNEE SURGERY     3 arthroscopic  2 on left 1 on right   LAPAROSCOPIC GASTRIC BANDING  2009   LAPAROSCOPIC GASTRIC SLEEVE RESECTION N/A 01/08/2020   Procedure: LAPAROSCOPIC SLEEVE GASTRECTOMY;  Surgeon: Ethyl Lenis, MD;  Location: [facility];  Service: General;  Laterality: N/A;   LAPAROSCOPIC SALPINGOOPHERECTOMY  03/30/2002   POLYPECTOMY  12/23/2021   Procedure: POLYPECTOMY;  Surgeon: Shaaron Lamar HERO, MD;  [facility];  Service: Endoscopy;;   TOTAL KNEE ARTHROPLASTY Left 03/27/2021   Procedure: TOTAL KNEE ARTHROPLASTY;  Surgeon: Kay Kemps, MD;  Location: [facility];  Service: Orthopedics;  Laterality: Left;   TOTAL KNEE ARTHROPLASTY Right 07/10/2021   Procedure: TOTAL KNEE ARTHROPLASTY;  Surgeon: Kay Kemps, MD;  Location: [facility];  Service: Orthopedics;  Laterality: Right;   TUBAL LIGATION      Family Psychiatric History: See below  Family History:  Family History  Problem Relation Age of Onset   Ovarian cancer Mother    Heart disease Mother    Anxiety disorder Mother    Cirrhosis Father        deceased age 33   Alcohol  abuse Father    Anxiety disorder  Sister    Dementia Brother    Dementia Maternal Grandfather    Liver cancer Cousin        age 30, deceased   Drug abuse Cousin    ADD / ADHD Son    Colon cancer Other        aunt, deceased age 65   Breast cancer Other        aunt, deceased age 78   Bipolar disorder Neg Hx    Depression Neg Hx    OCD Neg Hx    Paranoid behavior Neg Hx    Schizophrenia Neg Hx    Seizures Neg Hx    Sexual abuse Neg Hx    Physical abuse Neg Hx    Sleep apnea Neg Hx     Social History:  Social History   Socioeconomic History   Marital status: Widowed    Spouse name: Not on file   Number of children: 2   Years  of education: college   Highest education level: Not on file  Occupational History   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    Employer: DISABLED  Tobacco Use   Smoking status: Never    Passive exposure: Never   Smokeless tobacco: Never  Vaping Use   Vaping status: Never Used  Substance and Sexual Activity   Alcohol  use: Not Currently   Drug use: Never   Sexual activity: Not Currently    Birth control/protection: Surgical  Other Topics Concern   Not on file  Social History Narrative   Not on file   Social Drivers of Health   Financial Resource Strain: Not on file  Food Insecurity: No Food Insecurity (09/30/2022)   Hunger Vital Sign    Worried About Running Out of Food in the Last Year: Never true    Ran Out of Food in the Last Year: Never true  Transportation Needs: No Transportation Needs (09/30/2022)   PRAPARE - Administrator, Civil Service (Medical): No    Lack of Transportation (Non-Medical): No  Physical Activity: Not on file  Stress: Not on file  Social Connections: Not on file    Allergies:  Allergies  Allergen Reactions   Bactrim Itching, Nausea And Vomiting and Rash    Bactrim brand name. Redness.    Metabolic Disorder Labs: Lab Results  Component Value Date   HGBA1C 6.2 10/17/2023   MPG 140 06/29/2021   MPG 139.85  03/17/2021   No results found for: PROLACTIN Lab Results  Component Value Date   CHOL 188 05/19/2023   TRIG 125 05/19/2023   HDL 62 05/19/2023   CHOLHDL 3.0 05/19/2023   LDLCALC 104 (H) 05/19/2023   LDLCALC 111 (H) 06/28/2022   Lab Results  Component Value Date   TSH 1.930 05/19/2023   TSH 1.060 06/15/2021    Therapeutic Level Labs: No results found for: LITHIUM No results found for: VALPROATE Lab Results  Component Value Date   CBMZ 7.5 09/19/2012   CBMZ 6.6 05/18/2011    Current Medications: Current Outpatient Medications  Medication Sig Dispense Refill   ALPRAZolam  (XANAX ) 1 MG tablet Take 1 tablet (1 mg total) by mouth 3 (three) times daily as needed for anxiety. 270 tablet 1   ARIPiprazole  (ABILIFY ) 10 MG tablet Take 1 tablet (10 mg total) by mouth at bedtime. 90 tablet 2   Ascorbic Acid  500 MG CHEW Take 500 mg by mouth daily.     Blood Glucose Monitoring Suppl (FREESTYLE LITE) DEVI Use to measure glucose 4 times a day 1 each 0   CALCIUM -VITAMIN D -VITAMIN K PO Take 1 tablet by mouth daily. Chewable     Continuous Glucose Receiver (FREESTYLE LIBRE 3 [name]) DEVI 1 Piece by Does not apply route once as needed for up to 1 dose. 1 each 0   Continuous Glucose Sensor (FREESTYLE LIBRE 3 SENSOR) MISC 1 Piece by Does not apply route every 14 (fourteen) days. Place 1 sensor on the skin every 14 days. Use to check glucose continuously 2 each 3   cycloSPORINE  (RESTASIS ) 0.05 % ophthalmic emulsion Place 2 drops into both eyes 2 (two) times daily.      diltiazem  (CARDIZEM  SR) 60 MG 12 hr capsule Take 1 capsule (60 mg total) by mouth every 8 (eight) hours as needed (Palpitations). (Patient not taking: Reported on 11/30/2023) 90 capsule 1   escitalopram  (LEXAPRO ) 20 MG tablet Take 1 tablet (20 mg total) by mouth daily. 90 tablet 2  fluticasone  (FLONASE ) 50 MCG/ACT nasal spray Place 2 sprays into the nose daily as needed for allergies.      gabapentin  (NEURONTIN ) 100 MG capsule  Take 1 capsule (100 mg total) by mouth 2 (two) times daily. 180 capsule 1   glucose blood test strip Test 4 times a day, she has freestyle lite meter 150 each 2   Insulin  Pen Needle (B-D ULTRAFINE III SHORT PEN) 31G X 8 MM MISC 1 each by Does not apply route as directed. 100 each 3   linaclotide  (LINZESS ) 290 MCG CAPS capsule Take 1 capsule (290 mcg total) by mouth daily before breakfast. 30 capsule 3   loratadine  (CLARITIN ) 10 MG tablet Take 10 mg by mouth daily.     Menthol , Topical Analgesic, (BENGAY EX) Apply 1 Application topically daily as needed (pain).     metFORMIN  (GLUCOPHAGE -XR) 500 MG 24 hr tablet Take 1 tablet (500 mg total) by mouth daily with breakfast. 90 tablet 3   Multiple Vitamin (MULTIVITAMIN WITH MINERALS) TABS tablet Take 1 tablet by mouth daily. celebrate multivitamin 2 in 1     Multiple Vitamins-Minerals (IMMUNE SYSTEM BOOSTER PO) Take 1 tablet by mouth daily.     NON FORMULARY Pt uses a cpap nightly     omeprazole (PRILOSEC) 20 MG capsule Take 20 mg by mouth daily.     polyethylene glycol (MIRALAX  / GLYCOLAX ) 17 g packet Take 17 g by mouth daily.     Probiotic Product (PROBIOTIC PO) Take 1 tablet by mouth daily.     rizatriptan  (MAXALT -MLT) 10 MG disintegrating tablet Take 10 mg by mouth as needed for migraine. May repeat in 2 hours if needed     Semaglutide , 2 MG/DOSE, (OZEMPIC , 2 MG/DOSE,) 8 MG/3ML SOPN Inject 2 mg into the skin once a week. 9 mL 3   temazepam  (RESTORIL ) 30 MG capsule Take 1 capsule (30 mg total) by mouth at bedtime. 30 capsule 2   No current facility-administered medications for this visit.     Musculoskeletal: Strength & Muscle Tone: within normal limits Gait & Station: normal Patient leans: N/A  Psychiatric Specialty Exam: Review of Systems  Gastrointestinal:  Positive for constipation.  All other systems reviewed and are negative.   There were no vitals taken for this visit.There is no height or weight on file to calculate BMI.  General  Appearance: Casual and Fairly Groomed  Eye Contact:  Good  Speech:  Clear and Coherent  Volume:  Normal  Mood:  Euthymic  Affect:  Congruent  Thought Process:  Goal Directed  Orientation:  Full (Time, Place, and Person)  Thought Content: WDL   Suicidal Thoughts:  No  Homicidal Thoughts:  No  Memory:  Immediate;   Good Recent;   Good Remote;   Fair  Judgement:  Good  Insight:  Good  Psychomotor Activity:  Normal  Concentration:  Concentration: Good and Attention Span: Good  Recall:  Good  Fund of Knowledge: Good  Language: Good  Akathisia:  No  Handed:  Right  AIMS (if indicated): not done  Assets:  Communication Skills Desire for Improvement Resilience Social Support Talents/Skills  ADL's:  Intact  Cognition: WNL  Sleep:  Good   Screenings: Insurance account manager from 02/02/2023 in [facility] [place] Video Visit from 02/17/2022 in [facility] at [place] Video Visit from 11/20/2021 in [facility] at [place] Video Visit from 08/12/2021 in [facility]  at [facility] Video Visit from 05/14/2021 in [facility] at [facility] Total Score 6 0 0 0 1  PHQ-9 Total Score 20 -- -- -- --   Flowsheet Row Counselor from 02/02/2023 in [facility] at [place] ED from 08/06/2022 in [facility] at [facility] Video Visit from 02/17/2022 in [facility] at [place]  C-SSRS RISK CATEGORY No Risk No Risk No Risk     Assessment and Plan: This patient is a 63 year old female with a history of bipolar disorder.  She continues to do well on her current regimen.  She will continue Lexapro  20 mg daily for depression, Abilify  10 mg at bedtime for mood stabilization, Xanax  1 mg 3 times daily for anxiety and temazepam  30 mg at bedtime for sleep.  She  will return to see me in 3 months  Collaboration of Care: Collaboration of Care: Primary Care Provider AEB notes to be shared with PCP at patient's request  Patient/Guardian was advised Release of Information must be obtained prior to any record release in order to collaborate their care with an outside provider. Patient/Guardian was advised if they have not already done so to contact the registration department to sign all necessary forms in order for us  to release information regarding their care.   Consent: Patient/Guardian gives verbal consent for treatment and assignment of benefits for services provided during this visit. Patient/Guardian expressed understanding and agreed to proceed.    Barnie Gull, MD 12/01/2023, 1:15 PM

## 2023-12-06 ENCOUNTER — Telehealth: Payer: Self-pay | Admitting: *Deleted

## 2023-12-06 NOTE — Telephone Encounter (Signed)
 I would recommend she increase her MiraLAX  to twice daily every day with Linzess . If she is still not having good bowel movements on a regular basis, let me know and we can try something different.

## 2023-12-06 NOTE — Telephone Encounter (Signed)
 Pt called and stated she was having a hard time having a bowel movement. She states she had taking Linzness 290mcg, MiraLax  the night before and yesterday, she drank a bottle of  citrate magnesium and stool softens. She states she finally went a little. But her stomach still felt tight. She then called back and stated she went more and felt better and her stomach was bubbling and she thought she may have to go more. I informed her to call back in a couple days with in update.

## 2023-12-06 NOTE — Telephone Encounter (Signed)
 Spoke to pt, informed her of recommendations. She voiced understanding. She states she had a bowel movement today.

## 2023-12-12 DIAGNOSIS — E1165 Type 2 diabetes mellitus with hyperglycemia: Secondary | ICD-10-CM | POA: Diagnosis not present

## 2023-12-15 ENCOUNTER — Other Ambulatory Visit: Payer: Self-pay

## 2023-12-15 DIAGNOSIS — E1165 Type 2 diabetes mellitus with hyperglycemia: Secondary | ICD-10-CM

## 2023-12-15 MED ORDER — FREESTYLE LIBRE 3 PLUS SENSOR MISC: 1 refills | Status: AC

## 2023-12-28 ENCOUNTER — Ambulatory Visit: Admitting: Gastroenterology

## 2024-01-24 ENCOUNTER — Encounter: Payer: Self-pay | Admitting: Obstetrics & Gynecology

## 2024-01-24 ENCOUNTER — Other Ambulatory Visit (HOSPITAL_COMMUNITY)
Admission: RE | Admit: 2024-01-24 | Discharge: 2024-01-24 | Disposition: A | Discharge status: Home / Self Care | Source: Ambulatory Visit | Attending: Obstetrics & Gynecology | Admitting: Obstetrics & Gynecology

## 2024-01-24 ENCOUNTER — Ambulatory Visit (INDEPENDENT_AMBULATORY_CARE_PROVIDER_SITE_OTHER): Admitting: Obstetrics & Gynecology

## 2024-01-24 ENCOUNTER — Other Ambulatory Visit (HOSPITAL_COMMUNITY): Payer: Self-pay | Admitting: Family Medicine

## 2024-01-24 VITALS — BP 131/87 | HR 88 | Ht 66.0 in | Wt 254.0 lb

## 2024-01-24 DIAGNOSIS — Z1231 Encounter for screening mammogram for malignant neoplasm of breast: Secondary | ICD-10-CM

## 2024-01-24 DIAGNOSIS — Z1151 Encounter for screening for human papillomavirus (HPV): Secondary | ICD-10-CM | POA: Insufficient documentation

## 2024-01-24 DIAGNOSIS — Z124 Encounter for screening for malignant neoplasm of cervix: Secondary | ICD-10-CM

## 2024-01-24 DIAGNOSIS — Z113 Encounter for screening for infections with a predominantly sexual mode of transmission: Secondary | ICD-10-CM | POA: Diagnosis present

## 2024-01-24 DIAGNOSIS — Z90711 Acquired absence of uterus with remaining cervical stump: Secondary | ICD-10-CM

## 2024-01-24 DIAGNOSIS — Z01419 Encounter for gynecological examination (general) (routine) without abnormal findings: Secondary | ICD-10-CM | POA: Diagnosis present

## 2024-01-24 DIAGNOSIS — N939 Abnormal uterine and vaginal bleeding, unspecified: Secondary | ICD-10-CM

## 2024-01-24 NOTE — Addendum Note (Signed)
 Addended by: ILEAN RUTHERFORD HERO on: 01/24/2024 04:10 PM   Modules accepted: Orders

## 2024-01-24 NOTE — Progress Notes (Signed)
 Chief Complaint  Patient presents with   Vaginal Bleeding    Started last week-light brown Intercourse 6 weeks ago-first time in 19 months, vaginal itching      63 y.o. G2P2 No LMP recorded. Patient has had a hysterectomy. The current method of family planning is status post hysterectomy.  Outpatient Encounter Medications as of 01/24/2024  Medication Sig   ALPRAZolam  (XANAX ) 1 MG tablet Take 1 tablet (1 mg total) by mouth 3 (three) times daily as needed for anxiety.   ARIPiprazole  (ABILIFY ) 10 MG tablet Take 1 tablet (10 mg total) by mouth at bedtime.   Ascorbic Acid  500 MG CHEW Take 500 mg by mouth daily.   Blood Glucose Monitoring Suppl (FREESTYLE LITE) DEVI Use to measure glucose 4 times a day   CALCIUM -VITAMIN D -VITAMIN K PO Take 1 tablet by mouth daily. Chewable   Continuous Glucose Receiver (FREESTYLE LIBRE 3 READER) DEVI 1 Piece by Does not apply route once as needed for up to 1 dose.   Continuous Glucose Sensor (FREESTYLE LIBRE 3 PLUS SENSOR) MISC Use to monitor glucose continuously as instructed. Change sensor every 15 days.   cycloSPORINE  (RESTASIS ) 0.05 % ophthalmic emulsion Place 2 drops into both eyes 2 (two) times daily.    diltiazem  (CARDIZEM  SR) 60 MG 12 hr capsule Take 1 capsule (60 mg total) by mouth every 8 (eight) hours as needed (Palpitations).   escitalopram  (LEXAPRO ) 20 MG tablet Take 1 tablet (20 mg total) by mouth daily.   fluticasone  (FLONASE ) 50 MCG/ACT nasal spray Place 2 sprays into the nose daily as needed for allergies.    gabapentin  (NEURONTIN ) 100 MG capsule Take 1 capsule (100 mg total) by mouth 2 (two) times daily. (Patient taking differently: Take 100 mg by mouth daily.)   glucose blood test strip Test 4 times a day, she has freestyle lite meter   Insulin  Pen Needle (B-D ULTRAFINE III SHORT PEN) 31G X 8 MM MISC 1 each by Does not apply route as directed.   linaclotide  (LINZESS ) 290 MCG CAPS capsule Take 1 capsule (290 mcg total) by mouth daily  before breakfast.   loratadine  (CLARITIN ) 10 MG tablet Take 10 mg by mouth daily.   Menthol , Topical Analgesic, (BENGAY EX) Apply 1 Application topically daily as needed (pain).   metFORMIN  (GLUCOPHAGE -XR) 500 MG 24 hr tablet Take 1 tablet (500 mg total) by mouth daily with breakfast.   Multiple Vitamin (MULTIVITAMIN WITH MINERALS) TABS tablet Take 1 tablet by mouth daily. celebrate multivitamin 2 in 1   Multiple Vitamins-Minerals (IMMUNE SYSTEM BOOSTER PO) Take 1 tablet by mouth daily.   omeprazole (PRILOSEC) 20 MG capsule Take 20 mg by mouth daily.   polyethylene glycol (MIRALAX  / GLYCOLAX ) 17 g packet Take 17 g by mouth daily.   Probiotic Product (PROBIOTIC PO) Take 1 tablet by mouth daily.   rizatriptan  (MAXALT -MLT) 10 MG disintegrating tablet Take 10 mg by mouth as needed for migraine. May repeat in 2 hours if needed   Semaglutide , 2 MG/DOSE, (OZEMPIC , 2 MG/DOSE,) 8 MG/3ML SOPN Inject 2 mg into the skin once a week.   temazepam  (RESTORIL ) 30 MG capsule Take 1 capsule (30 mg total) by mouth at bedtime.   NON FORMULARY Pt uses a cpap nightly   No facility-administered encounter medications on file as of 01/24/2024.    Subjective Pt had some brown spotting all of last week She had supracervical hysterectomy 2003 requiring colostomy due to diverticular abscess  Past Medical History:  Diagnosis  Date   Adenomatous polyp 2009   Anxiety    Arthritis    Bipolar 1 disorder (HCC)    Constipation    Depression    Diabetes mellitus    Diabetes mellitus, type II (HCC)    Diverticula of colon 2009   Generalized headaches    GERD (gastroesophageal reflux disease)    History of hiatal hernia    small   Hypertension    no meds   Migraine    Neuropathy    both feet   Obesity    Pelvic floor dysfunction    abnormal anorectal manometry at [facility] in 2009   Plantar fasciitis both feet   Sleep apnea    cpap setting of 3.5   Vaginal Pap smear, abnormal     Past Surgical History:  Procedure  Laterality Date   ABDOMINAL HYSTERECTOMY     compete   CATARACT EXTRACTION W/PHACO Right 02/15/2017   Procedure: CATARACT EXTRACTION PHACO AND INTRAOCULAR LENS PLACEMENT (IOC);  Surgeon: Roz Anes, MD;  Location: [facility];  Service: Ophthalmology;  Laterality: Right;  CDE: 4.18   CATARACT EXTRACTION W/PHACO Left 03/01/2017   Procedure: CATARACT EXTRACTION PHACO AND INTRAOCULAR LENS PLACEMENT (IOC);  Surgeon: Roz Anes, MD;  Location: [facility];  Service: Ophthalmology;  Laterality: Left;  CDE: 2.56   COLON RESECTION  03/30/2002   with end-colostomy and Hartmann's pouch   COLON SURGERY     complicated diverticulitis requiring sigmoid resection with colostomy and subsequent takedown   COLONOSCOPY  12/11/2007   Dr. Shaaron- marginal prep, normal rectum pancolonic diverticula, adenomatous polyp   COLONOSCOPY  08/28/2003    Wide open colonic anastomosis/Scattered diverticula noted throughout colon/ Small external hemorrhoids   COLONOSCOPY  04/2012   [facility]: hyperplastic polyps, diverticulosis, ileocolonic anastomosis.   COLONOSCOPY WITH PROPOFOL  N/A 06/07/2016   Sigmoid diverticulosis, s/p prior segmental resection. 5 year surveillance. Personal history of polyps in past.   COLONOSCOPY WITH PROPOFOL  N/A 12/23/2021   Procedure: COLONOSCOPY WITH PROPOFOL ;  Surgeon: Shaaron Lamar HERO, MD;  Location: [facility];  Service: Endoscopy;  Laterality: N/A;  2:45pm, asa 3, knows new arrival time per Mindy   COLOSTOMY CLOSURE  07/10/2002   ESOPHAGOGASTRODUODENOSCOPY N/A 01/08/2020   Procedure: UPPER GASTROINTESTINAL ENDOSCOPY;  Surgeon: Ethyl Lenis, MD;  Location: [facility];  Service: General;  Laterality: N/A;   ESOPHAGOGASTRODUODENOSCOPY (EGD) WITH PROPOFOL  N/A 08/23/2016   Procedure: ESOPHAGOGASTRODUODENOSCOPY (EGD) WITH PROPOFOL ;  Surgeon: Lenis Ethyl, MD;  Location: [facility];  Service: General;  Laterality: N/A;   FLEXIBLE SIGMOIDOSCOPY  01/20/2012   RMR: incomplete/attempted colonoscopy. Inadequate  prep precluded examination   HEEL SPUR SURGERY Left 09/11/2013   both have been done   KNEE ARTHROSCOPY  02/04/2004    left knee/partial medial meniscectomy.   KNEE SURGERY     3 arthroscopic  2 on left 1 on right   LAPAROSCOPIC GASTRIC BANDING  2009   LAPAROSCOPIC GASTRIC SLEEVE RESECTION N/A 01/08/2020   Procedure: LAPAROSCOPIC SLEEVE GASTRECTOMY;  Surgeon: Ethyl Lenis, MD;  Location: [facility];  Service: General;  Laterality: N/A;   LAPAROSCOPIC SALPINGOOPHERECTOMY  03/30/2002   POLYPECTOMY  12/23/2021   Procedure: POLYPECTOMY;  Surgeon: Shaaron Lamar HERO, MD;  Location: [facility];  Service: Endoscopy;;   TOTAL KNEE ARTHROPLASTY Left 03/27/2021   Procedure: TOTAL KNEE ARTHROPLASTY;  Surgeon: Kay Kemps, MD;  Location: [facility];  Service: Orthopedics;  Laterality: Left;   TOTAL KNEE ARTHROPLASTY Right 07/10/2021   Procedure: TOTAL KNEE ARTHROPLASTY;  Surgeon: Kay Kemps,  MD;  Location: [facility];  Service: Orthopedics;  Laterality: Right;   TUBAL LIGATION      OB History     Gravida  2   Para  2   Term      Preterm      AB      Living  2      SAB      IAB      Ectopic      Multiple      Live Births              Allergies  Allergen Reactions   Bactrim Itching, Nausea And Vomiting and Rash    Bactrim brand name. Redness.    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    Employer: DISABLED  Tobacco Use   Smoking status: Never    Passive exposure: Never   Smokeless tobacco: Never  Vaping Use   Vaping status: Never Used  Substance and Sexual Activity   Alcohol  use: Not Currently   Drug use: Never   Sexual activity: Yes    Birth control/protection: Surgical    Comment: hyst  Other Topics Concern   Not on file  Social  History Narrative   Not on file   Social Drivers of Health   Financial Resource Strain: Not on file  Food Insecurity: No Food Insecurity (01/24/2024)   Hunger Vital Sign    Worried About Running Out of Food in the Last Year: Never true    Ran Out of Food in the Last Year: Never true  Transportation Needs: No Transportation Needs (09/30/2022)   PRAPARE - Administrator, Civil Service (Medical): No    Lack of Transportation (Non-Medical): No  Physical Activity: Not on file  Stress: Not on file  Social Connections: Not on file    Family History  Problem Relation Age of Onset   Ovarian cancer Mother    Heart disease Mother    Anxiety disorder Mother    Cirrhosis Father        deceased age 59   Alcohol  abuse Father    Anxiety disorder Sister    Dementia Brother    Dementia Maternal Grandfather    Liver cancer Cousin        age 70, deceased   Drug abuse Cousin    ADD / ADHD Son    Colon cancer Other        aunt, deceased age 41   Breast cancer Other        aunt, deceased age 63   Bipolar disorder Neg Hx    Depression Neg Hx    OCD Neg Hx    Paranoid behavior Neg Hx    Schizophrenia Neg Hx    Seizures Neg Hx    Sexual abuse Neg Hx    Physical abuse Neg Hx    Sleep apnea Neg Hx     Medications:       Current Outpatient Medications:    ALPRAZolam  (XANAX ) 1 MG tablet, Take 1 tablet (1 mg total) by mouth 3 (three) times daily as needed for anxiety., Disp: 270 tablet, Rfl: 1   ARIPiprazole  (ABILIFY ) 10 MG tablet, Take 1 tablet (10 mg total)  by mouth at bedtime., Disp: 90 tablet, Rfl: 2   Ascorbic Acid  500 MG CHEW, Take 500 mg by mouth daily., Disp: , Rfl:    Blood Glucose Monitoring Suppl (FREESTYLE LITE) DEVI, Use to measure glucose 4 times a day, Disp: 1 each, Rfl: 0   CALCIUM -VITAMIN D -VITAMIN K PO, Take 1 tablet by mouth daily. Chewable, Disp: , Rfl:    Continuous Glucose Receiver (FREESTYLE LIBRE 3 READER) DEVI, 1 Piece by Does not apply route once as needed  for up to 1 dose., Disp: 1 each, Rfl: 0   Continuous Glucose Sensor (FREESTYLE LIBRE 3 PLUS SENSOR) MISC, Use to monitor glucose continuously as instructed. Change sensor every 15 days., Disp: 6 each, Rfl: 1   cycloSPORINE  (RESTASIS ) 0.05 % ophthalmic emulsion, Place 2 drops into both eyes 2 (two) times daily. , Disp: , Rfl:    diltiazem  (CARDIZEM  SR) 60 MG 12 hr capsule, Take 1 capsule (60 mg total) by mouth every 8 (eight) hours as needed (Palpitations)., Disp: 90 capsule, Rfl: 1   escitalopram  (LEXAPRO ) 20 MG tablet, Take 1 tablet (20 mg total) by mouth daily., Disp: 90 tablet, Rfl: 2   fluticasone  (FLONASE ) 50 MCG/ACT nasal spray, Place 2 sprays into the nose daily as needed for allergies. , Disp: , Rfl:    gabapentin  (NEURONTIN ) 100 MG capsule, Take 1 capsule (100 mg total) by mouth 2 (two) times daily. (Patient taking differently: Take 100 mg by mouth daily.), Disp: 180 capsule, Rfl: 1   glucose blood test strip, Test 4 times a day, she has freestyle lite meter, Disp: 150 each, Rfl: 2   Insulin  Pen Needle (B-D ULTRAFINE III SHORT PEN) 31G X 8 MM MISC, 1 each by Does not apply route as directed., Disp: 100 each, Rfl: 3   linaclotide  (LINZESS ) 290 MCG CAPS capsule, Take 1 capsule (290 mcg total) by mouth daily before breakfast., Disp: 30 capsule, Rfl: 3   loratadine  (CLARITIN ) 10 MG tablet, Take 10 mg by mouth daily., Disp: , Rfl:    Menthol , Topical Analgesic, (BENGAY EX), Apply 1 Application topically daily as needed (pain)., Disp: , Rfl:    metFORMIN  (GLUCOPHAGE -XR) 500 MG 24 hr tablet, Take 1 tablet (500 mg total) by mouth daily with breakfast., Disp: 90 tablet, Rfl: 3   Multiple Vitamin (MULTIVITAMIN WITH MINERALS) TABS tablet, Take 1 tablet by mouth daily. celebrate multivitamin 2 in 1, Disp: , Rfl:    Multiple Vitamins-Minerals (IMMUNE SYSTEM BOOSTER PO), Take 1 tablet by mouth daily., Disp: , Rfl:    omeprazole (PRILOSEC) 20 MG capsule, Take 20 mg by mouth daily., Disp: , Rfl:     polyethylene glycol (MIRALAX  / GLYCOLAX ) 17 g packet, Take 17 g by mouth daily., Disp: , Rfl:    Probiotic Product (PROBIOTIC PO), Take 1 tablet by mouth daily., Disp: , Rfl:    rizatriptan  (MAXALT -MLT) 10 MG disintegrating tablet, Take 10 mg by mouth as needed for migraine. May repeat in 2 hours if needed, Disp: , Rfl:    Semaglutide , 2 MG/DOSE, (OZEMPIC , 2 MG/DOSE,) 8 MG/3ML SOPN, Inject 2 mg into the skin once a week., Disp: 9 mL, Rfl: 3   temazepam  (RESTORIL ) 30 MG capsule, Take 1 capsule (30 mg total) by mouth at bedtime., Disp: 30 capsule, Rfl: 2   NON FORMULARY, Pt uses a cpap nightly, Disp: , Rfl:   Objective Blood pressure 131/87, pulse 88, height 5' 6 (1.676 m), weight 254 lb (115.2 kg).  General [facility] female NAD Vulva:  normal appearing  vulva with no masses, tenderness or lesions Vagina:  normal mucosa, no discharge Cervix:  Normal no lesions Uterus:  surgically absent Adnexa: ovaries:present,  normal adnexa in size, nontender and no masses   Pertinent ROS No burning with urination, frequency or urgency No nausea, vomiting or diarrhea Nor fever chills or other constitutional symptoms'  Labs or studies     Impression + Management Plan: Diagnoses this Encounter::   ICD-10-CM   1. Vaginal spotting  N93.9       Exam normal, cytology is pending  Medications prescribed during  this encounter: No orders of the defined types were placed in this encounter.   Labs or Scans Ordered during this encounter: No orders of the defined types were placed in this encounter.     Follow up Return in about 3 years (around 01/24/2027) for Follow up, with Dr Jayne.

## 2024-01-27 ENCOUNTER — Ambulatory Visit (HOSPITAL_COMMUNITY): Payer: Self-pay | Admitting: Obstetrics & Gynecology

## 2024-01-27 LAB — CYTOLOGY - PAP
Chlamydia: NEGATIVE
Comment: NEGATIVE
Comment: NEGATIVE
Comment: NEGATIVE
Comment: NORMAL
Diagnosis: NEGATIVE
High risk HPV: NEGATIVE
Neisseria Gonorrhea: NEGATIVE
Trichomonas: NEGATIVE

## 2024-02-13 ENCOUNTER — Other Ambulatory Visit: Payer: Self-pay | Admitting: Internal Medicine

## 2024-02-22 ENCOUNTER — Ambulatory Visit: Admitting: "Endocrinology

## 2024-02-28 ENCOUNTER — Encounter (HOSPITAL_COMMUNITY): Payer: Self-pay | Admitting: Psychiatry

## 2024-02-28 ENCOUNTER — Telehealth (INDEPENDENT_AMBULATORY_CARE_PROVIDER_SITE_OTHER): Admitting: Psychiatry

## 2024-02-28 DIAGNOSIS — F3162 Bipolar disorder, current episode mixed, moderate: Secondary | ICD-10-CM | POA: Diagnosis not present

## 2024-02-28 DIAGNOSIS — F411 Generalized anxiety disorder: Secondary | ICD-10-CM | POA: Diagnosis not present

## 2024-02-28 MED ORDER — ALPRAZOLAM 1 MG PO TABS: 1.0000 mg | ORAL_TABLET | Freq: Three times a day (TID) | ORAL | 1 refills | Status: AC | PRN

## 2024-02-28 MED ORDER — ESCITALOPRAM OXALATE 20 MG PO TABS: 20.0000 mg | ORAL_TABLET | Freq: Every day | ORAL | 2 refills | Status: AC

## 2024-02-28 MED ORDER — ARIPIPRAZOLE 10 MG PO TABS: 10.0000 mg | ORAL_TABLET | Freq: Every day | ORAL | 2 refills | Status: AC

## 2024-02-28 MED ORDER — TEMAZEPAM 30 MG PO CAPS: 30.0000 mg | ORAL_CAPSULE | Freq: Every day | ORAL | 2 refills | Status: DC

## 2024-02-28 NOTE — Progress Notes (Signed)
 Virtual Visit via Video Note  I connected with [name] on 02/28/24 at  1:00 PM EDT by a video enabled telemedicine application and verified that I am speaking with the correct person using two identifiers.  Location: Patient: home Provider: office   I discussed the limitations of evaluation and management by telemedicine and the availability of in person appointments. The patient expressed understanding and agreed to proceed.     I discussed the assessment and treatment plan with the patient. The patient was provided an opportunity to ask questions and all were answered. The patient agreed with the plan and demonstrated an understanding of the instructions.   The patient was advised to call back or seek an in-person evaluation if the symptoms worsen or if the condition fails to improve as anticipated.  I provided 20 minutes of non-face-to-face time during this encounter.   Barnie Gull, MD  [facility] MD/PA/NP OP Progress Note  02/28/2024 1:20 PM [name]  MRN:  [id number]  Chief Complaint:  Chief Complaint  Patient presents with   Anxiety   Depression   Follow-up   HPI:  This patient is a 63 year old widowed white female who lives alone in [place].  She has 2 grown sons who live outside the home.   The patient returns for follow-up after 3 months regarding her bipolar disorder and generalized anxiety disorder.  Last time she was dating someone new.  However she states he broke it off.  She thinks that she came on too strong.  She now feels pretty lonely but is staying busy with her church groups her sons and going to the [facility].  She does feel somewhat sad but not seriously depressed or suicidal.  She is sleeping fairly well.  She does think the medications are still helping her mood depression and anxiety. Visit Diagnosis:    ICD-10-CM   1. Bipolar 1 disorder, mixed, moderate (HCC)  F31.62       Past Psychiatric History: Long-term outpatient treatment  Past  Medical History:  Past Medical History:  Diagnosis Date   Adenomatous polyp 2009   Anxiety    Arthritis    Bipolar 1 disorder (HCC)    Constipation    Depression    Diabetes mellitus    Diabetes mellitus, type II (HCC)    Diverticula of colon 2009   Generalized headaches    GERD (gastroesophageal reflux disease)    History of hiatal hernia    small   Hypertension    no meds   Migraine    Neuropathy    both feet   Obesity    Pelvic floor dysfunction    abnormal anorectal manometry at [facility] in 2009   Plantar fasciitis both feet   Sleep apnea    cpap setting of 3.5   Vaginal Pap smear, abnormal     Past Surgical History:  Procedure Laterality Date   ABDOMINAL HYSTERECTOMY     compete   CATARACT EXTRACTION W/PHACO Right 02/15/2017   Procedure: CATARACT EXTRACTION PHACO AND INTRAOCULAR LENS PLACEMENT (IOC);  Surgeon: Roz Anes, MD;  Location: [facility];  Service: Ophthalmology;  Laterality: Right;  CDE: 4.18   CATARACT EXTRACTION W/PHACO Left 03/01/2017   Procedure: CATARACT EXTRACTION PHACO AND INTRAOCULAR LENS PLACEMENT (IOC);  Surgeon: Roz Anes, MD;  Location: [facility];  Service: Ophthalmology;  Laterality: Left;  CDE: 2.56   COLON RESECTION  03/30/2002   with end-colostomy and Hartmann's pouch   COLON SURGERY     complicated  diverticulitis requiring sigmoid resection with colostomy and subsequent takedown   COLONOSCOPY  12/11/2007   Dr. Shaaron- marginal prep, normal rectum pancolonic diverticula, adenomatous polyp   COLONOSCOPY  08/28/2003    Wide open colonic anastomosis/Scattered diverticula noted throughout colon/ Small external hemorrhoids   COLONOSCOPY  04/2012   [facility]: hyperplastic polyps, diverticulosis, ileocolonic anastomosis.   COLONOSCOPY WITH PROPOFOL  N/A 06/07/2016   Sigmoid diverticulosis, s/p prior segmental resection. 5 year surveillance. Personal history of polyps in past.   COLONOSCOPY WITH PROPOFOL  N/A 12/23/2021   Procedure: COLONOSCOPY WITH  PROPOFOL ;  Surgeon: Shaaron Lamar HERO, MD;  Location[facility];  Service: Endoscopy;  Laterality: N/A;  2:45pm, asa 3, knows new arrival time per Mindy   COLOSTOMY CLOSURE  07/10/2002   ESOPHAGOGASTRODUODENOSCOPY N/A 01/08/2020   Procedure: UPPER GASTROINTESTINAL ENDOSCOPY;  Surgeon: Ethyl Lenis, MD;  Location: [facility];  Service: General;  Laterality: N/A;   ESOPHAGOGASTRODUODENOSCOPY (EGD) WITH PROPOFOL  N/A 08/23/2016   Procedure: ESOPHAGOGASTRODUODENOSCOPY (EGD) WITH PROPOFOL ;  Surgeon: Lenis Ethyl, MD;  Location: [facility];  Service: General;  Laterality: N/A;   FLEXIBLE SIGMOIDOSCOPY  01/20/2012   [facility]: incomplete/attempted colonoscopy. Inadequate prep precluded examination   HEEL SPUR SURGERY Left 09/11/2013   both have been done   KNEE ARTHROSCOPY  02/04/2004    left knee/partial medial meniscectomy.   KNEE SURGERY     3 arthroscopic  2 on left 1 on right   LAPAROSCOPIC GASTRIC BANDING  2009   LAPAROSCOPIC GASTRIC SLEEVE RESECTION N/A 01/08/2020   Procedure: LAPAROSCOPIC SLEEVE GASTRECTOMY;  Surgeon: Ethyl Lenis, MD;  Location: [facility];  Service: General;  Laterality: N/A;   LAPAROSCOPIC SALPINGOOPHERECTOMY  03/30/2002   POLYPECTOMY  12/23/2021   Procedure: POLYPECTOMY;  Surgeon: Shaaron Lamar HERO, MD;  [facility];  Service: Endoscopy;;   TOTAL KNEE ARTHROPLASTY Left 03/27/2021   Procedure: TOTAL KNEE ARTHROPLASTY;  Surgeon: Kay Kemps, MD;  Location: [facility];  Service: Orthopedics;  Laterality: Left;   TOTAL KNEE ARTHROPLASTY Right 07/10/2021   Procedure: TOTAL KNEE ARTHROPLASTY;  Surgeon: Kay Kemps, MD;  Location: [facility];  Service: Orthopedics;  Laterality: Right;   TUBAL LIGATION      Family Psychiatric History: See below  Family History:  Family History  Problem Relation Age of Onset   Ovarian cancer Mother    Heart disease Mother    Anxiety disorder Mother    Cirrhosis Father        deceased age 52   Alcohol  abuse Father    Anxiety disorder  Sister    Dementia Brother    Dementia Maternal Grandfather    Liver cancer Cousin        age 53, deceased   Drug abuse Cousin    ADD / ADHD Son    Colon cancer Other        aunt, deceased age 20   Breast cancer Other        aunt, deceased age 33   Bipolar disorder Neg Hx    Depression Neg Hx    OCD Neg Hx    Paranoid behavior Neg Hx    Schizophrenia Neg Hx    Seizures Neg Hx    Sexual abuse Neg Hx    Physical abuse Neg Hx    Sleep apnea Neg Hx     Social History:  Social History   Socioeconomic History   Marital status: Widowed    Spouse name: Not on file   Number of children: 2   Years of  education: college   Highest education level: Not on file  Occupational History   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    Employer: DISABLED  Tobacco Use   Smoking status: Never    Passive exposure: Never   Smokeless tobacco: Never  Vaping Use   Vaping status: Never Used  Substance and Sexual Activity   Alcohol  use: Not Currently   Drug use: Never   Sexual activity: Yes    Birth control/protection: Surgical    Comment: hyst  Other Topics Concern   Not on file  Social History Narrative   Not on file   Social Drivers of Health   Financial Resource Strain: Low Risk  (01/24/2024)   Overall Financial Resource Strain (CARDIA)    Difficulty of Paying Living Expenses: Not hard at all  Food Insecurity: No Food Insecurity (01/24/2024)   Hunger Vital Sign    Worried About Running Out of Food in the Last Year: Never true    Ran Out of Food in the Last Year: Never true  Transportation Needs: No Transportation Needs (01/24/2024)   PRAPARE - Administrator, Civil Service (Medical): No    Lack of Transportation (Non-Medical): No  Physical Activity: Insufficiently Active (01/24/2024)   Exercise Vital Sign    Days of Exercise per Week: 4 days    Minutes of Exercise per Session: 30 min  Stress: Stress Concern Present (01/24/2024)   [employer] of  Occupational Health - Occupational Stress Questionnaire    Feeling of Stress: Very much  Social Connections: Moderately Integrated (01/24/2024)   Social Connection and Isolation Panel    Frequency of Communication with Friends and Family: More than three times a week    Frequency of Social Gatherings with Friends and Family: More than three times a week    Attends Religious Services: More than 4 times per year    Active Member of [employer] or Organizations: Yes    Attends Banker Meetings: More than 4 times per year    Marital Status: Widowed    Allergies:  Allergies  Allergen Reactions   Bactrim Itching, Nausea And Vomiting and Rash    Bactrim brand name. Redness.    Metabolic Disorder Labs: Lab Results  Component Value Date   HGBA1C 6.2 10/17/2023   MPG 140 06/29/2021   MPG 139.85 03/17/2021   No results found for: PROLACTIN Lab Results  Component Value Date   CHOL 188 05/19/2023   TRIG 125 05/19/2023   HDL 62 05/19/2023   CHOLHDL 3.0 05/19/2023   LDLCALC 104 (H) 05/19/2023   LDLCALC 111 (H) 06/28/2022   Lab Results  Component Value Date   TSH 1.930 05/19/2023   TSH 1.060 06/15/2021    Therapeutic Level Labs: No results found for: LITHIUM No results found for: VALPROATE Lab Results  Component Value Date   CBMZ 7.5 09/19/2012   CBMZ 6.6 05/18/2011    Current Medications: Current Outpatient Medications  Medication Sig Dispense Refill   ALPRAZolam  (XANAX ) 1 MG tablet Take 1 tablet (1 mg total) by mouth 3 (three) times daily as needed for anxiety. 270 tablet 1   ARIPiprazole  (ABILIFY ) 10 MG tablet Take 1 tablet (10 mg total) by mouth at bedtime. 90 tablet 2   Ascorbic Acid  500 MG CHEW Take 500 mg by mouth daily.     Blood Glucose Monitoring Suppl (FREESTYLE LITE) DEVI Use to measure glucose 4 times a day 1 each 0   CALCIUM -VITAMIN D -VITAMIN K  PO Take 1 tablet by mouth daily. Chewable     Continuous Glucose Receiver (FREESTYLE LIBRE 3 READER) DEVI  1 Piece by Does not apply route once as needed for up to 1 dose. 1 each 0   Continuous Glucose Sensor (FREESTYLE LIBRE 3 PLUS SENSOR) MISC Use to monitor glucose continuously as instructed. Change sensor every 15 days. 6 each 1   cycloSPORINE  (RESTASIS ) 0.05 % ophthalmic emulsion Place 2 drops into both eyes 2 (two) times daily.      diltiazem  (CARDIZEM  SR) 60 MG 12 hr capsule Take 1 capsule (60 mg total) by mouth every 8 (eight) hours as needed (Palpitations). 30 capsule 0   escitalopram  (LEXAPRO ) 20 MG tablet Take 1 tablet (20 mg total) by mouth daily. 90 tablet 2   fluticasone  (FLONASE ) 50 MCG/ACT nasal spray Place 2 sprays into the nose daily as needed for allergies.      gabapentin  (NEURONTIN ) 100 MG capsule Take 1 capsule (100 mg total) by mouth 2 (two) times daily. (Patient taking differently: Take 100 mg by mouth daily.) 180 capsule 1   glucose blood test strip Test 4 times a day, she has freestyle lite meter 150 each 2   Insulin  Pen Needle (B-D ULTRAFINE III SHORT PEN) 31G X 8 MM MISC 1 each by Does not apply route as directed. 100 each 3   linaclotide  (LINZESS ) 290 MCG CAPS capsule Take 1 capsule (290 mcg total) by mouth daily before breakfast. 30 capsule 3   loratadine  (CLARITIN ) 10 MG tablet Take 10 mg by mouth daily.     Menthol , Topical Analgesic, (BENGAY EX) Apply 1 Application topically daily as needed (pain).     metFORMIN  (GLUCOPHAGE -XR) 500 MG 24 hr tablet Take 1 tablet (500 mg total) by mouth daily with breakfast. 90 tablet 3   Multiple Vitamin (MULTIVITAMIN WITH MINERALS) TABS tablet Take 1 tablet by mouth daily. celebrate multivitamin 2 in 1     Multiple Vitamins-Minerals (IMMUNE SYSTEM BOOSTER PO) Take 1 tablet by mouth daily.     NON FORMULARY Pt uses a cpap nightly     omeprazole (PRILOSEC) 20 MG capsule Take 20 mg by mouth daily.     polyethylene glycol (MIRALAX  / GLYCOLAX ) 17 g packet Take 17 g by mouth daily.     Probiotic Product (PROBIOTIC PO) Take 1 tablet by mouth  daily.     rizatriptan  (MAXALT -MLT) 10 MG disintegrating tablet Take 10 mg by mouth as needed for migraine. May repeat in 2 hours if needed     Semaglutide , 2 MG/DOSE, (OZEMPIC , 2 MG/DOSE,) 8 MG/3ML SOPN Inject 2 mg into the skin once a week. 9 mL 3   temazepam  (RESTORIL ) 30 MG capsule Take 1 capsule (30 mg total) by mouth at bedtime. 30 capsule 2   No current facility-administered medications for this visit.     Musculoskeletal: Strength & Muscle Tone: within normal limits Gait & Station: normal Patient leans: N/A  Psychiatric Specialty Exam: Review of Systems  Neurological:  Positive for dizziness.  All other systems reviewed and are negative.   There were no vitals taken for this visit.There is no height or weight on file to calculate BMI.  General Appearance: Casual and Fairly Groomed  Eye Contact:  Good  Speech:  Clear and Coherent  Volume:  Normal  Mood:  Dysphoric  Affect:  Congruent  Thought Process:  Goal Directed  Orientation:  Full (Time, Place, and Person)  Thought Content: Rumination   Suicidal Thoughts:  No  Homicidal  Thoughts:  No  Memory:  Immediate;   Good Recent;   Good Remote;   Good  Judgement:  Good  Insight:  Good  Psychomotor Activity:  Decreased  Concentration:  Concentration: Good and Attention Span: Good  Recall:  Good  Fund of Knowledge: Good  Language: Good  Akathisia:  No  Handed:  Right  AIMS (if indicated): not done  Assets:  Communication Skills Desire for Improvement Resilience Social Support Talents/Skills  ADL's:  Intact  Cognition: WNL  Sleep:  Good   Screenings: GAD-7    Flowsheet Row Office Visit from 01/24/2024 in [facility]  Total GAD-7 Score 12   PHQ2-9    Flowsheet Row Office Visit from 01/24/2024 in [facility] Counselor from 02/02/2023 in [facility] [place] Video Visit from 02/17/2022 in  [facility] [place] Video Visit from 11/20/2021 in [facility] [place] Video Visit from 08/12/2021 in [facility] Total Score 3 6 0 0 0  PHQ-9 Total Score 14 20 -- -- --   Flowsheet Row Counselor from 02/02/2023 in [facility] [place] ED from 08/06/2022 in [facility] at [facility] Video Visit from 02/17/2022 in [facility] at [place]  C-SSRS RISK CATEGORY No Risk No Risk No Risk     Assessment and Plan: This patient is a 63 year old female with a history of bipolar disorder and generalized anxiety.  She is doing well on her current regimen.  She will continue Lexapro  20 mg daily for depression, Abilify  10 mg at bedtime for mood stabilization, Xanax  1 mg 3 times daily for anxiety and temazepam  30 mg at bedtime for sleep.  She will return to see me in 3 months  Collaboration of Care: Collaboration of Care: Primary Care Provider AEB notes to be shared with PCP at patient's request  Patient/Guardian was advised Release of Information must be obtained prior to any record release in order to collaborate their care with an outside provider. Patient/Guardian was advised if they have not already done so to contact the registration department to sign all necessary forms in order for us  to release information regarding their care.   Consent: Patient/Guardian gives verbal consent for treatment and assignment of benefits for services provided during this visit. Patient/Guardian expressed understanding and agreed to proceed.    Barnie Gull, MD 02/28/2024, 1:20 PM

## 2024-03-10 LAB — COMPREHENSIVE METABOLIC PANEL WITH GFR
ALT: 13 IU/L (ref 0–32)
AST: 15 IU/L (ref 0–40)
Albumin: 4.4 g/dL (ref 3.9–4.9)
Alkaline Phosphatase: 104 IU/L (ref 49–135)
BUN/Creatinine Ratio: 34 — ABNORMAL HIGH (ref 12–28)
BUN: 25 mg/dL (ref 8–27)
Bilirubin Total: 0.3 mg/dL (ref 0.0–1.2)
CO2: 26 mmol/L (ref 20–29)
Calcium: 10.2 mg/dL (ref 8.7–10.3)
Chloride: 98 mmol/L (ref 96–106)
Creatinine, Ser: 0.73 mg/dL (ref 0.57–1.00)
Globulin, Total: 3.1 g/dL (ref 1.5–4.5)
Glucose: 146 mg/dL — ABNORMAL HIGH (ref 70–99)
Potassium: 4.3 mmol/L (ref 3.5–5.2)
Sodium: 139 mmol/L (ref 134–144)
Total Protein: 7.5 g/dL (ref 6.0–8.5)
eGFR: 92 mL/min/1.73 (ref >59)

## 2024-03-10 LAB — LIPID PANEL
Chol/HDL Ratio: 2.5 ratio (ref 0.0–4.4)
Cholesterol, Total: 159 mg/dL (ref 100–199)
HDL: 63 mg/dL (ref >39)
LDL Chol Calc (NIH): 73 mg/dL (ref 0–99)
Triglycerides: 131 mg/dL (ref 0–149)
VLDL Cholesterol Cal: 23 mg/dL (ref 5–40)

## 2024-03-22 ENCOUNTER — Encounter: Payer: Self-pay | Admitting: "Endocrinology

## 2024-03-22 ENCOUNTER — Ambulatory Visit (INDEPENDENT_AMBULATORY_CARE_PROVIDER_SITE_OTHER): Admitting: "Endocrinology

## 2024-03-22 VITALS — BP 122/76 | HR 68 | Ht 66.0 in | Wt 259.4 lb

## 2024-03-22 DIAGNOSIS — Z6838 Body mass index (BMI) 38.0-38.9, adult: Secondary | ICD-10-CM

## 2024-03-22 DIAGNOSIS — E1165 Type 2 diabetes mellitus with hyperglycemia: Secondary | ICD-10-CM

## 2024-03-22 DIAGNOSIS — E66812 Obesity, class 2: Secondary | ICD-10-CM

## 2024-03-22 DIAGNOSIS — E559 Vitamin D deficiency, unspecified: Secondary | ICD-10-CM | POA: Diagnosis not present

## 2024-03-22 DIAGNOSIS — I10 Essential (primary) hypertension: Secondary | ICD-10-CM | POA: Diagnosis not present

## 2024-03-22 DIAGNOSIS — E782 Mixed hyperlipidemia: Secondary | ICD-10-CM | POA: Diagnosis not present

## 2024-03-22 DIAGNOSIS — Z7985 Long-term (current) use of injectable non-insulin antidiabetic drugs: Secondary | ICD-10-CM

## 2024-03-22 LAB — POCT GLYCOSYLATED HEMOGLOBIN (HGB A1C): HbA1c, POC (controlled diabetic range): 6.8 % (ref 0.0–7.0)

## 2024-03-22 MED ORDER — GABAPENTIN 100 MG PO CAPS: 100.0000 mg | ORAL_CAPSULE | Freq: Two times a day (BID) | ORAL | 1 refills | Status: AC

## 2024-03-22 MED ORDER — TIRZEPATIDE 2.5 MG/0.5ML ~~LOC~~ SOAJ: 2.5000 mg | SUBCUTANEOUS | 1 refills | Status: DC

## 2024-03-22 NOTE — Progress Notes (Signed)
 03/22/2024, 1:26 PM                                     Endocrinology follow-up note   Subjective:    Patient ID: [name], female    DOB: 24-May-1960.  [name] is being seen in follow up after she was seen in consultation for management of currently uncontrolled symptomatic diabetes requested by  Marvine Rush, MD.   Past Medical History:  Diagnosis Date   Adenomatous polyp 2009   Anxiety    Arthritis    Bipolar 1 disorder (HCC)    Constipation    Depression    Diabetes mellitus    Diabetes mellitus, type II (HCC)    Diverticula of colon 2009   Generalized headaches    GERD (gastroesophageal reflux disease)    History of hiatal hernia    small   Hypertension    no meds   Migraine    Neuropathy    both feet   Obesity    Pelvic floor dysfunction    abnormal anorectal manometry at [facility] in 2009   Plantar fasciitis both feet   Sleep apnea    cpap setting of 3.5   Vaginal Pap smear, abnormal     Past Surgical History:  Procedure Laterality Date   ABDOMINAL HYSTERECTOMY     compete   CATARACT EXTRACTION W/PHACO Right 02/15/2017   Procedure: CATARACT EXTRACTION PHACO AND INTRAOCULAR LENS PLACEMENT (IOC);  Surgeon: Roz Anes, MD;  Location: [facility];  Service: Ophthalmology;  Laterality: Right;  CDE: 4.18   CATARACT EXTRACTION W/PHACO Left 03/01/2017   Procedure: CATARACT EXTRACTION PHACO AND INTRAOCULAR LENS PLACEMENT (IOC);  Surgeon: Roz Anes, MD;  Location: [facility];  Service: Ophthalmology;  Laterality: Left;  CDE: 2.56   COLON RESECTION  03/30/2002   with end-colostomy and Hartmann's pouch   COLON SURGERY     complicated diverticulitis requiring sigmoid resection with colostomy and subsequent takedown   COLONOSCOPY  12/11/2007   Dr. Shaaron- marginal prep, normal rectum pancolonic diverticula, adenomatous polyp   COLONOSCOPY  08/28/2003    Wide open colonic anastomosis/Scattered diverticula noted throughout colon/  Small external hemorrhoids   COLONOSCOPY  04/2012   [facility]: hyperplastic polyps, diverticulosis, ileocolonic anastomosis.   COLONOSCOPY WITH PROPOFOL  N/A 06/07/2016   Sigmoid diverticulosis, s/p prior segmental resection. 5 year surveillance. Personal history of polyps in past.   COLONOSCOPY WITH PROPOFOL  N/A 12/23/2021   Procedure: COLONOSCOPY WITH PROPOFOL ;  Surgeon: Shaaron Lamar HERO, MD;  [facility];  Service: Endoscopy;  Laterality: N/A;  2:45pm, asa 3, knows new arrival time per Mindy   COLOSTOMY CLOSURE  07/10/2002   ESOPHAGOGASTRODUODENOSCOPY N/A 01/08/2020   Procedure: UPPER GASTROINTESTINAL ENDOSCOPY;  Surgeon: Ethyl Lenis, MD;  Location: [facility];  Service: General;  Laterality: N/A;   ESOPHAGOGASTRODUODENOSCOPY (EGD) WITH PROPOFOL  N/A 08/23/2016   Procedure: ESOPHAGOGASTRODUODENOSCOPY (EGD) WITH PROPOFOL ;  Surgeon: Lenis Ethyl, MD;  Location: [facility];  Service: General;  Laterality: N/A;   FLEXIBLE SIGMOIDOSCOPY  01/20/2012   [facility]: incomplete/attempted colonoscopy. Inadequate prep precluded examination   HEEL SPUR SURGERY Left 09/11/2013   both have been done   KNEE ARTHROSCOPY  02/04/2004    left knee/partial medial meniscectomy.   KNEE SURGERY     3 arthroscopic  2 on left 1 on right   LAPAROSCOPIC GASTRIC BANDING  2009   LAPAROSCOPIC  GASTRIC SLEEVE RESECTION N/A 01/08/2020   Procedure: LAPAROSCOPIC SLEEVE GASTRECTOMY;  Surgeon: Ethyl Lenis, MD;  Location: [facility];  Service: General;  Laterality: N/A;   LAPAROSCOPIC SALPINGOOPHERECTOMY  03/30/2002   POLYPECTOMY  12/23/2021   Procedure: POLYPECTOMY;  Surgeon: Shaaron Lamar HERO, MD;  [facility];  Service: Endoscopy;;   TOTAL KNEE ARTHROPLASTY Left 03/27/2021   Procedure: TOTAL KNEE ARTHROPLASTY;  Surgeon: Kay Kemps, MD;  Location: [facility];  Service: Orthopedics;  Laterality: Left;   TOTAL KNEE ARTHROPLASTY Right 07/10/2021   Procedure: TOTAL KNEE ARTHROPLASTY;  Surgeon: Kay Kemps, MD;  Location:  [facility];  Service: Orthopedics;  Laterality: Right;   TUBAL LIGATION      Social History   Socioeconomic History   Marital status: Widowed    Spouse name: Not on file   Number of children: 2   Years of education: college   Highest education level: Not on file  Occupational History   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    Employer: DISABLED  Tobacco Use   Smoking status: Never    Passive exposure: Never   Smokeless tobacco: Never  Vaping Use   Vaping status: Never Used  Substance and Sexual Activity   Alcohol  use: Not Currently   Drug use: Never   Sexual activity: Yes    Birth control/protection: Surgical    Comment: hyst  Other Topics Concern   Not on file  Social History Narrative   Not on file   Social Drivers of Health   Financial Resource Strain: Low Risk  (01/24/2024)   Overall Financial Resource Strain (CARDIA)    Difficulty of Paying Living Expenses: Not hard at all  Food Insecurity: No Food Insecurity (01/24/2024)   Hunger Vital Sign    Worried About Running Out of Food in the Last Year: Never true    Ran Out of Food in the Last Year: Never true  Transportation Needs: No Transportation Needs (01/24/2024)   PRAPARE - Administrator, Civil Service (Medical): No    Lack of Transportation (Non-Medical): No  Physical Activity: Insufficiently Active (01/24/2024)   Exercise Vital Sign    Days of Exercise per Week: 4 days    Minutes of Exercise per Session: 30 min  Stress: Stress Concern Present (01/24/2024)   [employer] of Occupational Health - Occupational Stress Questionnaire    Feeling of Stress: Very much  Social Connections: Moderately Integrated (01/24/2024)   Social Connection and Isolation Panel    Frequency of Communication with Friends and Family: More than three times a week    Frequency of Social Gatherings with Friends and Family: More than three times a week    Attends Religious Services: More than 4 times per year     Active Member of [employer] or Organizations: Yes    Attends Banker Meetings: More than 4 times per year    Marital Status: Widowed    Family History  Problem Relation Age of Onset   Ovarian cancer Mother    Heart disease Mother    Anxiety disorder Mother    Cirrhosis Father        deceased age 61   Alcohol  abuse Father    Anxiety disorder Sister    Dementia Brother    Dementia Maternal Grandfather    Liver cancer Cousin        age 80, deceased   Drug abuse Cousin    ADD / ADHD Son  Colon cancer Other        aunt, deceased age 72   Breast cancer Other        aunt, deceased age 94   Bipolar disorder Neg Hx    Depression Neg Hx    OCD Neg Hx    Paranoid behavior Neg Hx    Schizophrenia Neg Hx    Seizures Neg Hx    Sexual abuse Neg Hx    Physical abuse Neg Hx    Sleep apnea Neg Hx     Outpatient Encounter Medications as of 03/22/2024  Medication Sig   tirzepatide (MOUNJARO) 2.5 MG/0.5ML Pen Inject 2.5 mg into the skin once a week.   ALPRAZolam  (XANAX ) 1 MG tablet Take 1 tablet (1 mg total) by mouth 3 (three) times daily as needed for anxiety.   ARIPiprazole  (ABILIFY ) 10 MG tablet Take 1 tablet (10 mg total) by mouth at bedtime.   Ascorbic Acid  500 MG CHEW Take 500 mg by mouth daily.   Blood Glucose Monitoring Suppl (FREESTYLE LITE) DEVI Use to measure glucose 4 times a day   CALCIUM -VITAMIN D -VITAMIN K PO Take 1 tablet by mouth daily. Chewable   Continuous Glucose Receiver (FREESTYLE LIBRE 3 READER) DEVI 1 Piece by Does not apply route once as needed for up to 1 dose.   Continuous Glucose Sensor (FREESTYLE LIBRE 3 PLUS SENSOR) MISC Use to monitor glucose continuously as instructed. Change sensor every 15 days.   cycloSPORINE  (RESTASIS ) 0.05 % ophthalmic emulsion Place 2 drops into both eyes 2 (two) times daily.    diltiazem  (CARDIZEM  SR) 60 MG 12 hr capsule Take 1 capsule (60 mg total) by mouth every 8 (eight) hours as needed (Palpitations).   escitalopram   (LEXAPRO ) 20 MG tablet Take 1 tablet (20 mg total) by mouth daily.   fluticasone  (FLONASE ) 50 MCG/ACT nasal spray Place 2 sprays into the nose daily as needed for allergies.    gabapentin  (NEURONTIN ) 100 MG capsule Take 1 capsule (100 mg total) by mouth 2 (two) times daily.   glucose blood test strip Test 4 times a day, she has freestyle lite meter   Insulin  Pen Needle (B-D ULTRAFINE III SHORT PEN) 31G X 8 MM MISC 1 each by Does not apply route as directed.   linaclotide  (LINZESS ) 290 MCG CAPS capsule Take 1 capsule (290 mcg total) by mouth daily before breakfast.   loratadine  (CLARITIN ) 10 MG tablet Take 10 mg by mouth daily.   Menthol , Topical Analgesic, (BENGAY EX) Apply 1 Application topically daily as needed (pain).   Multiple Vitamin (MULTIVITAMIN WITH MINERALS) TABS tablet Take 1 tablet by mouth daily. celebrate multivitamin 2 in 1   Multiple Vitamins-Minerals (IMMUNE SYSTEM BOOSTER PO) Take 1 tablet by mouth daily.   NON FORMULARY Pt uses a cpap nightly   omeprazole (PRILOSEC) 20 MG capsule Take 20 mg by mouth daily.   polyethylene glycol (MIRALAX  / GLYCOLAX ) 17 g packet Take 17 g by mouth daily.   Probiotic Product (PROBIOTIC PO) Take 1 tablet by mouth daily.   rizatriptan  (MAXALT -MLT) 10 MG disintegrating tablet Take 10 mg by mouth as needed for migraine. May repeat in 2 hours if needed   temazepam  (RESTORIL ) 30 MG capsule Take 1 capsule (30 mg total) by mouth at bedtime.   [DISCONTINUED] gabapentin  (NEURONTIN ) 100 MG capsule Take 1 capsule (100 mg total) by mouth 2 (two) times daily. (Patient taking differently: Take 100 mg by mouth daily.)   [DISCONTINUED] metFORMIN  (GLUCOPHAGE -XR) 500 MG 24 hr tablet Take  1 tablet (500 mg total) by mouth daily with breakfast. (Patient not taking: Reported on 03/22/2024)   [DISCONTINUED] Semaglutide , 2 MG/DOSE, (OZEMPIC , 2 MG/DOSE,) 8 MG/3ML SOPN Inject 2 mg into the skin once a week.   No facility-administered encounter medications on file as of  03/22/2024.    ALLERGIES: Allergies  Allergen Reactions   Bactrim Itching, Nausea And Vomiting and Rash    Bactrim brand name. Redness.    VACCINATION STATUS: Immunization History  Administered Date(s) Administered   Moderna Sars-Covid-2 Vaccination 07/21/2019, 08/22/2019   Pneumococcal Polysaccharide-23 01/09/2020    Diabetes She presents for her follow-up diabetic visit. She has type 2 diabetes mellitus. Onset time: She was diagnosed at approximate age of 44 years. Her disease course has been stable (She underwent sleeve gastrectomy on January 08, 2020, and she has lost 14 pounds since her surgery.). There are no hypoglycemic associated symptoms. Pertinent negatives for hypoglycemia include no confusion, headaches, pallor or seizures. Associated symptoms include fatigue. Pertinent negatives for diabetes include no chest pain, no polydipsia, no polyphagia and no polyuria. There are no hypoglycemic complications. Symptoms are stable. Diabetic complications include heart disease. Risk factors for coronary artery disease include dyslipidemia, family history, diabetes mellitus, hypertension, obesity, sedentary lifestyle and post-menopausal. Her weight is increasing steadily. She is following a generally unhealthy diet. When asked about meal planning, she reported none. She has not had a previous visit with a dietitian. She never participates in exercise. Her home blood glucose trend is fluctuating minimally. Her breakfast blood glucose range is generally 130-140 mg/dl. Her lunch blood glucose range is generally 130-140 mg/dl. Her dinner blood glucose range is generally 130-140 mg/dl. Her bedtime blood glucose range is generally 130-140 mg/dl. Her overall blood glucose range is 130-140 mg/dl. [name] presents with her freestyle Libre device showing average blood glucose of 127 mg/dl for  the last 14 days.   Her AGP report shows 94% time in range, 6% level 1 hyperglycemia.  She has no hypoglycemia.  Her  point-of-care A1c is 6.8% increasing from 6.2% during her last visit.  She has taken herself off of metformin .  She remains only on Ozempic , requests switching to Mounjaro.  This patient underwent bariatric surgery twice-gastric banding in 2009, sleeve gastrectomy in 2021.  ) An ACE inhibitor/angiotensin II receptor blocker is being taken. Eye exam is current.  Hypertension This is a chronic problem. The current episode started more than 1 year ago. The problem is uncontrolled. Pertinent negatives include no chest pain, headaches, palpitations or shortness of breath. Risk factors for coronary artery disease include diabetes mellitus, obesity, sedentary lifestyle and post-menopausal state. Past treatments include ACE inhibitors.    Review of systems: Limited as above. Objective:    BP 122/76   Pulse 68   Ht 5' 6 (1.676 m)   Wt 259 lb 6.4 oz (117.7 kg)   BMI 41.87 kg/m   Wt Readings from Last 3 Encounters:  03/22/24 259 lb 6.4 oz (117.7 kg)  01/24/24 254 lb (115.2 kg)  11/30/23 252 lb 12.8 oz (114.7 kg)     Physical Exam- Limited  Constitutional:  Body mass index is 41.87 kg/m. , not in acute distress, normal state of mind   CMP     Component Value Date/Time   NA 139 03/09/2024 0931   K 4.3 03/09/2024 0931   CL 98 03/09/2024 0931   CO2 26 03/09/2024 0931   GLUCOSE 146 (H) 03/09/2024 0931   GLUCOSE 104 (H) 08/06/2022 1233   BUN 25  03/09/2024 0931   CREATININE 0.73 03/09/2024 0931   CREATININE 0.74 03/23/2011 0400   CALCIUM  10.2 03/09/2024 0931   PROT 7.5 03/09/2024 0931   ALBUMIN 4.4 03/09/2024 0931   AST 15 03/09/2024 0931   ALT 13 03/09/2024 0931   ALKPHOS 104 03/09/2024 0931   BILITOT 0.3 03/09/2024 0931   GFRNONAA >60 08/06/2022 1233   GFRAA >60 01/01/2020 1356   Recent Results (from the past 2160 hours)  Cytology - PAP     Status: None   Collection Time: 01/24/24  4:10 PM  Result Value Ref Range   High risk HPV Negative    Neisseria Gonorrhea Negative     Chlamydia Negative    Trichomonas Negative    Adequacy Satisfactory for evaluation.    Diagnosis      - Negative for intraepithelial lesion or malignancy (NILM)   Comment Inflammation and atrophic changes are present.    Comment Normal Reference Range HPV - Negative    Comment Normal Reference Ranger Chlamydia - Negative    Comment      Normal Reference Range Neisseria Gonorrhea - Negative   Comment Normal Reference Range Trichomonas - Negative   Comprehensive metabolic panel with GFR     Status: Abnormal   Collection Time: 03/09/24  9:31 AM  Result Value Ref Range   Glucose 146 (H) 70 - 99 mg/dL   BUN 25 8 - 27 mg/dL   Creatinine, Ser 9.26 0.57 - 1.00 mg/dL   eGFR 92 >40 fO/fpw/8.26   BUN/Creatinine Ratio 34 (H) 12 - 28   Sodium 139 134 - 144 mmol/L   Potassium 4.3 3.5 - 5.2 mmol/L   Chloride 98 96 - 106 mmol/L   CO2 26 20 - 29 mmol/L   Calcium  10.2 8.7 - 10.3 mg/dL   Total Protein 7.5 6.0 - 8.5 g/dL   Albumin 4.4 3.9 - 4.9 g/dL   Globulin, Total 3.1 1.5 - 4.5 g/dL   Bilirubin Total 0.3 0.0 - 1.2 mg/dL   Alkaline Phosphatase 104 49 - 135 IU/L   AST 15 0 - 40 IU/L   ALT 13 0 - 32 IU/L  Lipid panel     Status: None   Collection Time: 03/09/24  9:31 AM  Result Value Ref Range   Cholesterol, Total 159 100 - 199 mg/dL   Triglycerides 868 0 - 149 mg/dL   HDL 63 >60 mg/dL   VLDL Cholesterol Cal 23 5 - 40 mg/dL   LDL Chol Calc (NIH) 73 0 - 99 mg/dL   Chol/HDL Ratio 2.5 0.0 - 4.4 ratio    Comment:                                   T. Chol/HDL Ratio                                             Men  Women                               1/2 Avg.Risk  3.4    3.3  Avg.Risk  5.0    4.4                                2X Avg.Risk  9.6    7.1                                3X Avg.Risk 23.4   11.0    Lipid Panel     Component Value Date/Time   CHOL 159 03/09/2024 0931   TRIG 131 03/09/2024 0931   HDL 63 03/09/2024 0931   CHOLHDL 2.5 03/09/2024 0931    LDLCALC 73 03/09/2024 0931   LABVLDL 23 03/09/2024 0931     Assessment & Plan:   1. Uncontrolled type 2 diabetes mellitus with hyperglycemia (HCC)  - [name] has currently uncontrolled symptomatic type 2 DM since  63 years of age.  [name] presents with her freestyle Libre device showing average blood glucose of 127 mg/dl for  the last 14 days.   Her AGP report shows 94% time in range, 6% level 1 hyperglycemia.  She has no hypoglycemia.  Her point-of-care A1c is 6.8% increasing from 6.2% during her last visit.  She has taken herself off of metformin .  She remains only on Ozempic , requests switching to Mounjaro.  This patient underwent bariatric surgery twice-gastric banding in 2009, sleeve gastrectomy in 2021.  - Recent labs reviewed. - I had a long discussion with her about the progressive nature of diabetes and the pathology behind its complications. -her diabetes is complicated by morbid obesity, sedentary life, hypertension and she remains at a high risk for more acute and chronic complications which include CAD, CVA, CKD, retinopathy, and neuropathy. These are all discussed in detail with her.  - I have counseled her on diet  and weight management  by adopting a carbohydrate restricted/protein rich diet. Patient is encouraged to switch to  unprocessed or minimally processed     complex starch and increased protein intake (animal or plant source), fruits, and vegetables. -  she is advised to stick to a routine mealtimes to eat 3 meals  a day and avoid unnecessary snacks ( to snack only to correct hypoglycemia).   In light of the fact that she underwent bariatric surgery twice,  she is approached for intensive therapeutic lifestyle change.  She would benefit the most from lifestyle medicine.  - she acknowledges that there is a room for improvement in her food and drink choices. - Suggestion is made for her to avoid simple carbohydrates  from her diet including Cakes, Sweet  Desserts, Ice Cream, Soda (diet and regular), Sweet Tea, Candies, Chips, Cookies, Store Bought Juices, Alcohol  , Artificial Sweeteners,  Coffee Creamer, and Sugar-free Products, Lemonade. This will help patient to have more stable blood glucose profile and potentially avoid unintended weight gain.  The following Lifestyle Medicine recommendations according to American College of Lifestyle Medicine  [facility]) were discussed and and offered to patient and she  agrees to start the journey:  A. Whole Foods, Plant-Based Nutrition comprising of fruits and vegetables, plant-based proteins, whole-grain carbohydrates was discussed in detail with the patient.   A list for source of those nutrients were also provided to the patient.  Patient will use only water  or unsweetened tea for hydration. B.  The need to stay away from risky substances including alcohol , smoking; obtaining 7 to 9 hours  of restorative sleep, at least 150 minutes of moderate intensity exercise weekly, the importance of healthy social connections,  and stress management techniques were discussed. C.  A full color page of  Calorie density of various food groups per pound showing examples of each food groups was provided to the patient.   - I have approached her with the following individualized plan to manage  her diabetes and patient agrees:   Her point-of-care A1c is 6.8%, she will continue to need intervention with GLP-1 receptor agonist.  She wishes to switch her Ozempic  to Mounjaro.  This is appropriate if her insurance provide coverage.  Calcium  prescribed Mounjaro 2.5 mg subcutaneously weekly.  Side effects and precaution discussed with her.  This medication will be advanced as she tolerates.   She took herself off of metformin  for now.  If her A1c continues to rise, this medication will be reconsidered.   She will continue to benefit from her CGM, advised to wear it at all times.  - Specific targets for  A1c;  LDL, HDL,  and  Triglycerides were discussed with the patient.   2) Blood Pressure /Hypertension: Her blood pressure is controlled to target.   she is advised to continue her current medications including lisinopril  5 mg p.o. daily with breakfast , lisinopril  will be increased to  10 mg for next refill.  3) Lipids/Hyperlipidemia:     -Her recent lipid panel showed improved LDL at 73, overall improving from 111.    She is not on statins, will be considered on subsequent visit.    4)  Weight/Diet: Her BMI is 41.87 kg/m-regaining weight after she lost 80+ pounds after her bariatric surgery.     She is still a good candidate for modest weight loss. See lifestyle medicine recommendations above.  5) Chronic Care/Health Maintenance:  -she  is on ACEI/ARB and  is encouraged to initiate and continue to follow up with Ophthalmology, Dentist,  Podiatrist at least yearly or according to recommendations, and advised to  stay away from smoking. I have recommended yearly flu vaccine and pneumonia vaccine at least every 5 years; moderate intensity exercise for up to 150 minutes weekly; and  sleep for at least 7 hours a day.  Her screening ABI was normal in December 2022, This study will be repeated in 5 years-December 2026, or sooner if needed.  She is benefiting from low-dose gabapentin  for diabetic neuropathy.  I refilled her gabapentin  100 mg p.o. twice daily.  - she is  advised to maintain close follow up with Marvine Rush, MD for primary care needs, as well as her other providers for optimal and coordinated care.   I spent  26  minutes in the care of the patient today including review of labs from CMP, Lipids, Thyroid  Function, Hematology (current and previous including abstractions from other facilities); face-to-face time discussing  her blood glucose readings/logs, discussing hypoglycemia and hyperglycemia episodes and symptoms, medications doses, her options of short and long term treatment based on the latest  standards of care / guidelines;  discussion about incorporating lifestyle medicine;  and documenting the encounter. Risk reduction counseling performed per USPSTF guidelines to reduce  obesity and cardiovascular risk factors.     Please refer to Patient Instructions for Blood Glucose Monitoring and Insulin /Medications Dosing Guide  in media tab for additional information. Please  also refer to  Patient Self Inventory in the Media  tab for reviewed elements of pertinent patient history.  [name] participated in the  discussions, expressed understanding, and voiced agreement with the above plans.  All questions were answered to her satisfaction. she is encouraged to contact clinic should she have any questions or concerns prior to her return visit.    Follow up plan: - Return in about 4 months (around 07/20/2024) for Bring Meter/CGM Device/Logs- A1c in Office.  [name] Earl, MD [facility] [address] [place], [place] [address] Phone: [phone]  Fax: [phone]    03/22/2024, 1:26 PM  This note was partially dictated with voice recognition software. Similar sounding words can be transcribed inadequately or may not  be corrected upon review.

## 2024-03-22 NOTE — Patient Instructions (Signed)

## 2024-03-26 ENCOUNTER — Ambulatory Visit (HOSPITAL_COMMUNITY)
Admission: RE | Admit: 2024-03-26 | Discharge: 2024-03-26 | Disposition: A | Discharge status: Home / Self Care | Source: Ambulatory Visit | Attending: Family Medicine | Admitting: Family Medicine

## 2024-03-26 ENCOUNTER — Encounter (HOSPITAL_COMMUNITY): Payer: Self-pay

## 2024-03-26 DIAGNOSIS — Z1231 Encounter for screening mammogram for malignant neoplasm of breast: Secondary | ICD-10-CM | POA: Diagnosis present

## 2024-04-02 ENCOUNTER — Encounter: Payer: Self-pay | Admitting: Gastroenterology

## 2024-04-02 ENCOUNTER — Ambulatory Visit (INDEPENDENT_AMBULATORY_CARE_PROVIDER_SITE_OTHER): Admitting: Gastroenterology

## 2024-04-02 VITALS — BP 138/81 | HR 72 | Temp 97.8°F | Ht 67.0 in | Wt 259.2 lb

## 2024-04-02 DIAGNOSIS — K5903 Drug induced constipation: Secondary | ICD-10-CM

## 2024-04-02 DIAGNOSIS — Z9049 Acquired absence of other specified parts of digestive tract: Secondary | ICD-10-CM | POA: Diagnosis not present

## 2024-04-02 DIAGNOSIS — K219 Gastro-esophageal reflux disease without esophagitis: Secondary | ICD-10-CM

## 2024-04-02 DIAGNOSIS — K581 Irritable bowel syndrome with constipation: Secondary | ICD-10-CM

## 2024-04-02 DIAGNOSIS — Z8719 Personal history of other diseases of the digestive system: Secondary | ICD-10-CM

## 2024-04-02 MED ORDER — LUBIPROSTONE 24 MCG PO CAPS: 24.0000 ug | ORAL_CAPSULE | Freq: Two times a day (BID) | ORAL | 1 refills | Status: AC

## 2024-04-02 NOTE — Patient Instructions (Addendum)
 I am sending in Amitiza  24 mcg capsules for you. You will take 1 capsule twice daily with a little bit of food to avoid nausea.  For now I want you to continue taking the MiraLAX  twice a day as well as stool softener, up to 3/day.  If with the Amitiza  and all of this you are having frequent loose stools then I want you to start decreasing a little bit of the over-the-counter stuff, such as reducing stool softener by 1 and MiraLAX  down to once a day and continue that pattern until you are comfortable with stool consistency and frequency.  Monitor for fever, worsening abdominal pain, and any rectal bleeding.  If you experience any of this prior to midday Wednesday please call the office and let us  know that way we can try to arrange for a stat CT scan to evaluate for diverticulitis.  If after that time you begin having any of the symptoms I want you to proceed to the ED.  As we discussed, I suspect that some of your abdominal pain may be secondary to the GLP-1 (Mounjaro ) given timing correlates with the shot.  Abdominal pain could be also secondary to the constipation given you are going irregularly.  Given some upper abdominal discomfort, please continue taking your omeprazole 20 mg daily.  Follow-up in 6 weeks.  It was a pleasure to see you today. I want to create trusting relationships with patients. If you receive a survey regarding your visit,  I greatly appreciate you taking time to fill this out on paper or through your MyChart. I value your feedback.  Charmaine Melia, MSN, FNP-BC, AGACNP-BC [facility]

## 2024-04-02 NOTE — Progress Notes (Signed)
 GI Office Note    Referring Provider: Marvine Rush, MD Primary Care Physician:  Marvine Rush, MD Primary Gastroenterologist: Lamar HERO.Rourk, MD  Date:  04/02/2024  ID:  Samantha, [name] 07/14/1960, MRN [id number]  Chief Complaint   Chief Complaint  Patient presents with   diverticulitis flare up    Diverticulitis flare up and constipation    History of Present Illness  [name] is a 63 y.o. female with a history of chronic constipation, GERD, complicated diverticulitis in the remote past requiring resection, adenomatous colon polyps due for surveillance in 2028 presenting today with complaint of constipation and some abdominal pain.   OV in April 2025 with worsening constipation in the setting of Ozempic  use.  Not controlled with Linzess  72 mcg and MiraLAX .  Was having associated lower abdominal pain that improves with a bowel movement.  Recommended increasing Linzess  to 140 mcg daily and increase further if needed.  Colonoscopy August 2023: - Diverticulosis, pancolonic - Surgical anastomosis at 20 cm - To 5-6 mm polyps removed - Nonbleeding internal hemorrhoids - Pathology with tubular adenomas - Recommended repeat in 5 years  Last office visit 11/30/2023 with Josette Centers, PA-C.  Seen for constipation.  Reported bowels moving 1-2 times a week.  Taking Linzess  [place] 5 mcg and MiraLAX  17 g daily.  Weekend prior she noted she had to take a stool softener, medicine citrate, and Linzess  200 mcg as well as an enema to have a bowel movement.  Drinking 40 ounces of water  daily.  Does not eat lots of vegetables.  Did not abdominal pain, melena, BRBPR, nausea or vomiting.  She was recommended increase her Linzess  to 290 mcg daily and continue MiraLAX  17 g daily and increase to twice daily if needed.  Advised her goal should be 64 ounces of water  daily and to increase her fiber consumption via fruits and vegetables.  Follow-up in 4 weeks.  On 7/29 patient reported she was  having hard time with bowel movement and she had been taking Linzess  290 as well as MiraLAX  as directed.  She drink a bottle of magnesium citrate and took stool softener and states she finally went to the bathroom a little bit the stomach still feeling tight but stomach with bubbling at times feel that she may need to go more.  She has recommended increasing her MiraLAX  to twice a day every day with her Linzess  and if still not having good bowel movement regular basis and if not being able to go and recommend something different.  Today:  Discussed the use of AI scribe software for clinical note transcription with the patient, who gave verbal consent to proceed.  She started Mounjaro  on Wednesday after discontinuing Ozempic , on which she lost about twelve pounds. She is currently on a 2.5 mg dose of Mounjaro . On Saturday and Sunday, she experienced severe pain in the lower left abdomen, which made it difficult to walk or stand. This was accompanied by dizziness, nausea, and a need to lie down. She took meclizine  for dizziness and a muscle relaxer, which helped after she slept. She lives alone and was scared by the episode. No recurrence of symptoms since then.  Her bowel movements occur twice a week despite taking 290 mcg of Linzess , mineral oil, stool softeners, and Miralax  twice a day. She recently added mineral oil to her regimen. When she does have bowel movements, they start as formed stools and become liquid, this is only about twice per week. She has  a history of diverticulitis and had surgery with twelve inches of her colon removed. Her last productive bowel movement was on Thursday or Friday last week. No melena or brbpr and other than this past weekend she has not had anymore nausea or vomiting either. No worsening reflux symptoms currently.   She experiences urgency with bowel movements, which affects her social activities, such as avoiding taking Linzess  on days she is out and about. She is  concerned about having accidents due to the urgency and limited notice before needing to use the bathroom. She mentions that she gets dizzy when lying flat and had to leave a line dancing class early due to dizziness.      Wt Readings from Last 6 Encounters:  04/02/24 259 lb 3.2 oz (117.6 kg)  03/22/24 259 lb 6.4 oz (117.7 kg)  01/24/24 254 lb (115.2 kg)  11/30/23 252 lb 12.8 oz (114.7 kg)  10/17/23 250 lb (113.4 kg)  08/31/23 256 lb 9.6 oz (116.4 kg)    Body mass index is 40.6 kg/m.   Current Outpatient Medications  Medication Sig Dispense Refill   ALPRAZolam  (XANAX ) 1 MG tablet Take 1 tablet (1 mg total) by mouth 3 (three) times daily as needed for anxiety. 270 tablet 1   ARIPiprazole  (ABILIFY ) 10 MG tablet Take 1 tablet (10 mg total) by mouth at bedtime. 90 tablet 2   Ascorbic Acid  500 MG CHEW Take 500 mg by mouth daily.     Blood Glucose Monitoring Suppl (FREESTYLE LITE) DEVI Use to measure glucose 4 times a day 1 each 0   CALCIUM -VITAMIN D -VITAMIN K PO Take 1 tablet by mouth daily. Chewable     Continuous Glucose Receiver (FREESTYLE LIBRE 3 READER) DEVI 1 Piece by Does not apply route once as needed for up to 1 dose. 1 each 0   Continuous Glucose Sensor (FREESTYLE LIBRE 3 PLUS SENSOR) MISC Use to monitor glucose continuously as instructed. Change sensor every 15 days. 6 each 1   cycloSPORINE  (RESTASIS ) 0.05 % ophthalmic emulsion Place 2 drops into both eyes 2 (two) times daily.      diltiazem  (CARDIZEM  SR) 60 MG 12 hr capsule Take 1 capsule (60 mg total) by mouth every 8 (eight) hours as needed (Palpitations). 30 capsule 0   escitalopram  (LEXAPRO ) 20 MG tablet Take 1 tablet (20 mg total) by mouth daily. 90 tablet 2   fluticasone  (FLONASE ) 50 MCG/ACT nasal spray Place 2 sprays into the nose daily as needed for allergies.      gabapentin  (NEURONTIN ) 100 MG capsule Take 1 capsule (100 mg total) by mouth 2 (two) times daily. 180 capsule 1   glucose blood test strip Test 4 times a day,  she has freestyle lite meter 150 each 2   Insulin  Pen Needle (B-D ULTRAFINE III SHORT PEN) 31G X 8 MM MISC 1 each by Does not apply route as directed. 100 each 3   linaclotide  (LINZESS ) 290 MCG CAPS capsule Take 1 capsule (290 mcg total) by mouth daily before breakfast. 30 capsule 3   loratadine  (CLARITIN ) 10 MG tablet Take 10 mg by mouth daily.     Menthol , Topical Analgesic, (BENGAY EX) Apply 1 Application topically daily as needed (pain).     Multiple Vitamin (MULTIVITAMIN WITH MINERALS) TABS tablet Take 1 tablet by mouth daily. celebrate multivitamin 2 in 1     Multiple Vitamins-Minerals (IMMUNE SYSTEM BOOSTER PO) Take 1 tablet by mouth daily.     NON FORMULARY Pt uses a cpap nightly  omeprazole (PRILOSEC) 20 MG capsule Take 20 mg by mouth daily.     polyethylene glycol (MIRALAX  / GLYCOLAX ) 17 g packet Take 17 g by mouth daily.     Probiotic Product (PROBIOTIC PO) Take 1 tablet by mouth daily.     rizatriptan  (MAXALT -MLT) 10 MG disintegrating tablet Take 10 mg by mouth as needed for migraine. May repeat in 2 hours if needed     temazepam  (RESTORIL ) 30 MG capsule Take 1 capsule (30 mg total) by mouth at bedtime. 30 capsule 2   tirzepatide  (MOUNJARO ) 2.5 MG/0.5ML Pen Inject 2.5 mg into the skin once a week. 6 mL 1   No current facility-administered medications for this visit.    Past Medical History:  Diagnosis Date   Adenomatous polyp 2009   Anxiety    Arthritis    Bipolar 1 disorder (HCC)    Constipation    Depression    Diabetes mellitus    Diabetes mellitus, type II (HCC)    Diverticula of colon 2009   Generalized headaches    GERD (gastroesophageal reflux disease)    History of hiatal hernia    small   Hypertension    no meds   Migraine    Neuropathy    both feet   Obesity    Pelvic floor dysfunction    abnormal anorectal manometry at [facility] in 2009   Plantar fasciitis both feet   Sleep apnea    cpap setting of 3.5   Vaginal Pap smear, abnormal     Past Surgical  History:  Procedure Laterality Date   ABDOMINAL HYSTERECTOMY     compete   CATARACT EXTRACTION W/PHACO Right 02/15/2017   Procedure: CATARACT EXTRACTION PHACO AND INTRAOCULAR LENS PLACEMENT (IOC);  Surgeon: Roz Anes, MD;  Location: [facility];  Service: Ophthalmology;  Laterality: Right;  CDE: 4.18   CATARACT EXTRACTION W/PHACO Left 03/01/2017   Procedure: CATARACT EXTRACTION PHACO AND INTRAOCULAR LENS PLACEMENT (IOC);  Surgeon: Roz Anes, MD;  Location: [facility];  Service: Ophthalmology;  Laterality: Left;  CDE: 2.56   COLON RESECTION  03/30/2002   with end-colostomy and Hartmann's pouch   COLON SURGERY     complicated diverticulitis requiring sigmoid resection with colostomy and subsequent takedown   COLONOSCOPY  12/11/2007   Dr. Shaaron- marginal prep, normal rectum pancolonic diverticula, adenomatous polyp   COLONOSCOPY  08/28/2003    Wide open colonic anastomosis/Scattered diverticula noted throughout colon/ Small external hemorrhoids   COLONOSCOPY  04/2012   [facility]: hyperplastic polyps, diverticulosis, ileocolonic anastomosis.   COLONOSCOPY WITH PROPOFOL  N/A 06/07/2016   Sigmoid diverticulosis, s/p prior segmental resection. 5 year surveillance. Personal history of polyps in past.   COLONOSCOPY WITH PROPOFOL  N/A 12/23/2021   Procedure: COLONOSCOPY WITH PROPOFOL ;  Surgeon: Shaaron Lamar HERO, MD;  Location: [facility];  Service: Endoscopy;  Laterality: N/A;  2:45pm, asa 3, knows new arrival time per Mindy   COLOSTOMY CLOSURE  07/10/2002   ESOPHAGOGASTRODUODENOSCOPY N/A 01/08/2020   Procedure: UPPER GASTROINTESTINAL ENDOSCOPY;  Surgeon: Ethyl Lenis, MD;  Location: [facility];  Service: General;  Laterality: N/A;   ESOPHAGOGASTRODUODENOSCOPY (EGD) WITH PROPOFOL  N/A 08/23/2016   Procedure: ESOPHAGOGASTRODUODENOSCOPY (EGD) WITH PROPOFOL ;  Surgeon: Lenis Ethyl, MD;  Location: [facility];  Service: General;  Laterality: N/A;   FLEXIBLE SIGMOIDOSCOPY  01/20/2012   RMR: incomplete/attempted  colonoscopy. Inadequate prep precluded examination   HEEL SPUR SURGERY Left 09/11/2013   both have been done   KNEE ARTHROSCOPY  02/04/2004    left knee/partial medial meniscectomy.  KNEE SURGERY     3 arthroscopic  2 on left 1 on right   LAPAROSCOPIC GASTRIC BANDING  2009   LAPAROSCOPIC GASTRIC SLEEVE RESECTION N/A 01/08/2020   Procedure: LAPAROSCOPIC SLEEVE GASTRECTOMY;  Surgeon: Ethyl Lenis, MD;  Location: [facility];  Service: General;  Laterality: N/A;   LAPAROSCOPIC SALPINGOOPHERECTOMY  03/30/2002   POLYPECTOMY  12/23/2021   Procedure: POLYPECTOMY;  Surgeon: Shaaron Lamar HERO, MD;  Location: [facility];  Service: Endoscopy;;   TOTAL KNEE ARTHROPLASTY Left 03/27/2021   Procedure: TOTAL KNEE ARTHROPLASTY;  Surgeon: Kay Kemps, MD;  Location: [facility];  Service: Orthopedics;  Laterality: Left;   TOTAL KNEE ARTHROPLASTY Right 07/10/2021   Procedure: TOTAL KNEE ARTHROPLASTY;  Surgeon: Kay Kemps, MD;  Location: [facility];  Service: Orthopedics;  Laterality: Right;   TUBAL LIGATION      Family History  Problem Relation Age of Onset   Ovarian cancer Mother    Heart disease Mother    Anxiety disorder Mother    Cirrhosis Father        deceased age 101   Alcohol  abuse Father    Anxiety disorder Sister    Dementia Brother    Dementia Maternal Grandfather    Liver cancer Cousin        age 15, deceased   Drug abuse Cousin    ADD / ADHD Son    Colon cancer Other        aunt, deceased age 72   Breast cancer Other        aunt, deceased age 60   Bipolar disorder Neg Hx    Depression Neg Hx    OCD Neg Hx    Paranoid behavior Neg Hx    Schizophrenia Neg Hx    Seizures Neg Hx    Sexual abuse Neg Hx    Physical abuse Neg Hx    Sleep apnea Neg Hx     Allergies as of 04/02/2024 - Review Complete 04/02/2024  Allergen Reaction Noted   Bactrim Itching, Nausea And Vomiting, and Rash 01/09/2011    Social History   Socioeconomic History   Marital status: Widowed    Spouse name: Not  on file   Number of children: 2   Years of education: college   Highest education level: Not on file  Occupational History   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    Employer: DISABLED  Tobacco Use   Smoking status: Never    Passive exposure: Never   Smokeless tobacco: Never  Vaping Use   Vaping status: Never Used  Substance and Sexual Activity   Alcohol  use: Not Currently   Drug use: Never   Sexual activity: Yes    Birth control/protection: Surgical    Comment: hyst  Other Topics Concern   Not on file  Social History Narrative   Not on file   Social Drivers of Health   Financial Resource Strain: Low Risk  (01/24/2024)   Overall Financial Resource Strain (CARDIA)    Difficulty of Paying Living Expenses: Not hard at all  Food Insecurity: No Food Insecurity (01/24/2024)   Hunger Vital Sign    Worried About Running Out of Food in the Last Year: Never true    Ran Out of Food in the Last Year: Never true  Transportation Needs: No Transportation Needs (01/24/2024)   PRAPARE - Administrator, Civil Service (Medical): No    Lack of Transportation (Non-Medical): No  Physical Activity: Insufficiently Active (01/24/2024)  Exercise Vital Sign    Days of Exercise per Week: 4 days    Minutes of Exercise per Session: 30 min  Stress: Stress Concern Present (01/24/2024)   [employer] of Occupational Health - Occupational Stress Questionnaire    Feeling of Stress: Very much  Social Connections: Moderately Integrated (01/24/2024)   Social Connection and Isolation Panel    Frequency of Communication with Friends and Family: More than three times a week    Frequency of Social Gatherings with Friends and Family: More than three times a week    Attends Religious Services: More than 4 times per year    Active Member of [employer] or Organizations: Yes    Attends Banker Meetings: More than 4 times per year    Marital Status: Widowed    Review of  Systems   Gen: Denies fever, chills, anorexia. Denies fatigue, weakness, weight loss.  CV: Denies chest pain, palpitations, syncope, peripheral edema, and claudication. Resp: Denies dyspnea at rest, cough, wheezing, coughing up blood, and pleurisy. GI: See HPI Derm: Denies rash, itching, dry skin Psych: Denies depression, anxiety, memory loss, confusion. No homicidal or suicidal ideation.  Heme: Denies bruising, bleeding, and enlarged lymph nodes.  Physical Exam   BP 138/81 (BP Location: Right Arm, Patient Position: Sitting, Cuff Size: Large)   Pulse 72   Temp 97.8 F (36.6 C) (Temporal)   Ht 5' 7 (1.702 m)   Wt 259 lb 3.2 oz (117.6 kg)   BMI 40.60 kg/m   General:   Alert and oriented. No distress noted. Pleasant and cooperative.  Head:  Normocephalic and atraumatic. Eyes:  Conjuctiva clear without scleral icterus. Mouth:  Oral mucosa pink and moist. Good dentition. No lesions. Abdomen:  +BS, soft, non-distended. Epigastric ttp and LLQ ttp. No rebound or guarding. No HSM or masses noted. Rectal: deferred Msk:  Symmetrical without gross deformities. Normal posture. Extremities:  non pitting peripheral edema.  Neurologic:  Alert and  oriented x4 Psych:  Alert and cooperative. Normal mood and affect.  Assessment & Plan  RENA HUNKE is a 63 y.o. female presenting today with constipation and single episode of severe abdominal pain.    Chronic constipation (possible IBS) refractory to current therapy Chronic constipation with bowel movements twice a week, currently on Linzess  290 mcg (every other day on average), Miralax , and stool softeners. Stool consistency varies from type 3 to type 4, with occasional liquid stools due to overflow. BM only about twice per week. Current regimen is ineffective. Amitiza  (lubiprostone ) is considered due to its efficacy and insurance coverage. Ibsrela may require prior trials of other medications but option in the future for constipation as well.   - Discontinue Linzess  upon starting Amitiza . - Initiated Amitiza  24 mcg twice daily with food to minimize nausea. - Continue Miralax  and stool softeners initially, with potential reduction if diarrhea occurs. - Will consider Motegrity as a last resort in addition to Amitiza  if it is ineffective alone.   Abdominal pain and gastrointestinal symptoms likely related to GLP-1 receptor agonist therapy Recent abdominal pain, dizziness, and nausea likely related to Mounjaro  (GLP-1 receptor agonist) initiation. Symptoms occurred post-Mounjaro  injection and resolved after rest. Differential includes medication side effects versus diverticulitis recurrence. Pain is not constant and has not recurred since initial episode. Discussed monitoring for return and for alarm symptoms.  - Monitor for recurrence of abdominal pain, fever, or blood in stool. - If pain recurs before Thanksgiving, will order stat CT scan to rule out diverticulitis. -  Advised ER visit if severe pain, fever, or blood in stool occur over Thanksgiving holiday and the weekend.  - Follow up call to the office next week if pain persists.  History of diverticulitis with partial colectomy Partial colectomy with 12 inches of colon removed in the past due to diverticlutis. Last colonoscopy showed healthy anastomosis. Current abdominal pain is not consistent with diverticulitis recurrence, but monitoring is necessary due to history. Some mild ttp to LLQ today.  - Monitor for signs of diverticulitis recurrence, including persistent pain, fever, or blood in stool. - Advised ER visit if symptoms suggestive of diverticulitis occur over Thanksgiving.      Follow up   Follow up 6-8 weeks.     Charmaine Melia, MSN, FNP-BC, AGACNP-BC [facility]

## 2024-05-17 ENCOUNTER — Other Ambulatory Visit: Payer: Self-pay | Admitting: "Endocrinology

## 2024-05-17 ENCOUNTER — Telehealth: Payer: Self-pay

## 2024-05-17 MED ORDER — TIRZEPATIDE 5 MG/0.5ML ~~LOC~~ SOAJ: 5.0000 mg | SUBCUTANEOUS | 0 refills | Status: AC

## 2024-05-17 NOTE — Telephone Encounter (Signed)
 Pt called stating she has been injecting Mounjaro  2.5mg  weekly and would like to increase dose and have Rx for a 90 day supply sent to [facility]. Tried to call pt, did not receive an answer.

## 2024-05-18 ENCOUNTER — Telehealth: Payer: Self-pay

## 2024-05-18 DIAGNOSIS — E1165 Type 2 diabetes mellitus with hyperglycemia: Secondary | ICD-10-CM

## 2024-05-18 MED ORDER — METFORMIN HCL ER 500 MG PO TB24: 500.0000 mg | ORAL_TABLET | Freq: Every day | ORAL | 1 refills | Status: AC

## 2024-05-18 NOTE — Telephone Encounter (Signed)
 Spoke with pt making her aware Dr.Nida evaluated her CGM data and recommends she restart Metformin  500mg  daily with breakfast and inject Mounjaro  5mg  SQ weekly per Dr. Lenis. Pt voiced understanding and agreed with plan. Pt requested Rx for Metformin  be sent to [facility]. Rx for Metformin  ER 500mg  every day sent to [facility].

## 2024-05-18 NOTE — Telephone Encounter (Signed)
-----   Message from Samantha Earl, MD sent at 05/18/2024 10:24 AM EST ----- She can resume metformin  500 mg daily at breakfast if she agrees, and Mounjaro  5 mg weekly.

## 2024-05-22 ENCOUNTER — Ambulatory Visit: Admitting: Gastroenterology

## 2024-05-30 ENCOUNTER — Telehealth (HOSPITAL_COMMUNITY): Admitting: Psychiatry

## 2024-06-08 ENCOUNTER — Telehealth (HOSPITAL_COMMUNITY): Payer: Self-pay

## 2024-06-08 NOTE — Telephone Encounter (Signed)
 Pt called in needing a refill on her  temazepam  (RESTORIL ) 30 MG capsule [facility] to [facility]. Pt scheduled 06/20/24. Please advise.

## 2024-06-11 ENCOUNTER — Other Ambulatory Visit (HOSPITAL_COMMUNITY): Payer: Self-pay | Admitting: Psychiatry

## 2024-06-11 MED ORDER — TEMAZEPAM 30 MG PO CAPS: 30.0000 mg | ORAL_CAPSULE | Freq: Every day | ORAL | 2 refills | Status: AC

## 2024-06-11 NOTE — Telephone Encounter (Signed)
 sent

## 2024-06-11 NOTE — Telephone Encounter (Signed)
 Pt aware.

## 2024-06-12 ENCOUNTER — Encounter: Payer: Self-pay | Admitting: Internal Medicine

## 2024-06-12 ENCOUNTER — Ambulatory Visit: Admitting: Internal Medicine

## 2024-06-12 VITALS — BP 141/88 | HR 83 | Temp 97.5°F | Ht 67.0 in | Wt 267.4 lb

## 2024-06-12 DIAGNOSIS — K5900 Constipation, unspecified: Secondary | ICD-10-CM

## 2024-06-12 DIAGNOSIS — R1030 Lower abdominal pain, unspecified: Secondary | ICD-10-CM

## 2024-06-12 DIAGNOSIS — Z8719 Personal history of other diseases of the digestive system: Secondary | ICD-10-CM

## 2024-06-12 DIAGNOSIS — R1032 Left lower quadrant pain: Secondary | ICD-10-CM

## 2024-06-12 NOTE — Patient Instructions (Addendum)
"   it was nice to see you again today.  Amitiza  24 mcg twice daily is working well for constipation.  Continue omeprazole 20 mg daily for good control of acid reflux.  Sorry you are still having left lower quadrant abdominal pain.  We will  get an updated CT to make sure nothing new has developed since your last scan.  Plan for repeat colonoscopy for surveillance purposes in 2028  Further recommendations to follow after CT scan results are available for review. "

## 2024-06-12 NOTE — Progress Notes (Unsigned)
 "   Gastroenterology Progress Note    Primary Care Physician:  Marvine Rush, MD Primary Gastroenterologist:  Dr. Shaaron  Pre-Procedure History & Physical: HPI:  [name] is a 64 y.o.  morbidly obese female here for follow-up of left lower quadrant abdominal pain in setting of constipation.  Since going on Amitiza  24 mcg twice daily constipation issues have resolved and is her left lower quadrant pain radiating to her left flank has not resolved has not had any associated UTI symptoms.  History of complicated diverticulitis requiring resection previously.  History of gastric lap band removed and sleeve surgery performed.  Has a known abdominal wall hernia on CT 3 years ago.  Not associated with eating or having a bowel movement denies rectal bleeding or melena.  She is able to eat okay.  Due for surveillance colonoscopy-history of polyps 2028   reflux well-controlled on omeprazole 20 mg daily  Past Medical History:  Diagnosis Date   Adenomatous polyp 2009   Anxiety    Arthritis    Bipolar 1 disorder (HCC)    Constipation    Depression    Diabetes mellitus    Diabetes mellitus, type II (HCC)    Diverticula of colon 2009   Generalized headaches    GERD (gastroesophageal reflux disease)    History of hiatal hernia    small   Hypertension    no meds   Migraine    Neuropathy    both feet   Obesity    Pelvic floor dysfunction    abnormal anorectal manometry at [facility] in 2009   Plantar fasciitis both feet   Sleep apnea    cpap setting of 3.5   Vaginal Pap smear, abnormal     Past Surgical History:  Procedure Laterality Date   ABDOMINAL HYSTERECTOMY     compete   CATARACT EXTRACTION W/PHACO Right 02/15/2017   Procedure: CATARACT EXTRACTION PHACO AND INTRAOCULAR LENS PLACEMENT (IOC);  Surgeon: Roz Anes, MD;  Location: [facility];  Service: Ophthalmology;  Laterality: Right;  CDE: 4.18   CATARACT EXTRACTION W/PHACO Left 03/01/2017   Procedure: CATARACT EXTRACTION PHACO AND  INTRAOCULAR LENS PLACEMENT (IOC);  Surgeon: Roz Anes, MD;  Location: [facility];  Service: Ophthalmology;  Laterality: Left;  CDE: 2.56   COLON RESECTION  03/30/2002   with end-colostomy and Hartmann's pouch   COLON SURGERY     complicated diverticulitis requiring sigmoid resection with colostomy and subsequent takedown   COLONOSCOPY  12/11/2007   Dr. Shaaron- marginal prep, normal rectum pancolonic diverticula, adenomatous polyp   COLONOSCOPY  08/28/2003    Wide open colonic anastomosis/Scattered diverticula noted throughout colon/ Small external hemorrhoids   COLONOSCOPY  04/2012   [facility]: hyperplastic polyps, diverticulosis, ileocolonic anastomosis.   COLONOSCOPY WITH PROPOFOL  N/A 06/07/2016   Sigmoid diverticulosis, s/p prior segmental resection. 5 year surveillance. Personal history of polyps in past.   COLONOSCOPY WITH PROPOFOL  N/A 12/23/2021   Procedure: COLONOSCOPY WITH PROPOFOL ;  Surgeon: Shaaron Lamar HERO, MD;  Location: [facility];  Service: Endoscopy;  Laterality: N/A;  2:45pm, asa 3, knows new arrival time per Mindy   COLOSTOMY CLOSURE  07/10/2002   ESOPHAGOGASTRODUODENOSCOPY N/A 01/08/2020   Procedure: UPPER GASTROINTESTINAL ENDOSCOPY;  Surgeon: Ethyl Lenis, MD;  Location: [facility];  Service: General;  Laterality: N/A;   ESOPHAGOGASTRODUODENOSCOPY (EGD) WITH PROPOFOL  N/A 08/23/2016   Procedure: ESOPHAGOGASTRODUODENOSCOPY (EGD) WITH PROPOFOL ;  Surgeon: Lenis Ethyl, MD;  Location: [facility];  Service: General;  Laterality: N/A;   FLEXIBLE SIGMOIDOSCOPY  01/20/2012  RMR: incomplete/attempted colonoscopy. Inadequate prep precluded examination   HEEL SPUR SURGERY Left 09/11/2013   both have been done   KNEE ARTHROSCOPY  02/04/2004    left knee/partial medial meniscectomy.   KNEE SURGERY     3 arthroscopic  2 on left 1 on right   LAPAROSCOPIC GASTRIC BANDING  2009   LAPAROSCOPIC GASTRIC SLEEVE RESECTION N/A 01/08/2020   Procedure: LAPAROSCOPIC SLEEVE GASTRECTOMY;  Surgeon:  Ethyl Lenis, MD;  Location: [facility];  Service: General;  Laterality: N/A;   LAPAROSCOPIC SALPINGOOPHERECTOMY  03/30/2002   POLYPECTOMY  12/23/2021   Procedure: POLYPECTOMY;  Surgeon: Shaaron Lamar HERO, MD;  Location: [facility];  Service: Endoscopy;;   TOTAL KNEE ARTHROPLASTY Left 03/27/2021   Procedure: TOTAL KNEE ARTHROPLASTY;  Surgeon: Kay Kemps, MD;  Location: [facility];  Service: Orthopedics;  Laterality: Left;   TOTAL KNEE ARTHROPLASTY Right 07/10/2021   Procedure: TOTAL KNEE ARTHROPLASTY;  Surgeon: Kay Kemps, MD;  Location: [facility];  Service: Orthopedics;  Laterality: Right;   TUBAL LIGATION      Prior to Admission medications  Medication Sig Start Date End Date Taking? Authorizing Provider  ALPRAZolam  (XANAX ) 1 MG tablet Take 1 tablet (1 mg total) by mouth 3 (three) times daily as needed for anxiety. 02/28/24  Yes Okey Barnie SAUNDERS, MD  ARIPiprazole  (ABILIFY ) 10 MG tablet Take 1 tablet (10 mg total) by mouth at bedtime. 02/28/24  Yes Okey Barnie SAUNDERS, MD  Ascorbic Acid  500 MG CHEW Take 500 mg by mouth daily.   Yes [provider]  Blood Glucose Monitoring Suppl (FREESTYLE LITE) DEVI Use to measure glucose 4 times a day 12/31/19  Yes Nida, Gebreselassie W, MD  CALCIUM -VITAMIN D -VITAMIN K PO Take 1 tablet by mouth daily. Chewable   Yes [provider]  Continuous Glucose Receiver (FREESTYLE LIBRE 3 READER) DEVI 1 Piece by Does not apply route once as needed for up to 1 dose. 03/22/23  Yes Nida, Ethelle ORN, MD  Continuous Glucose Sensor (FREESTYLE LIBRE 3 PLUS SENSOR) MISC Use to monitor glucose continuously as instructed. Change sensor every 15 days. 12/15/23  Yes Nida, Gebreselassie W, MD  cycloSPORINE  (RESTASIS ) 0.05 % ophthalmic emulsion Place 2 drops into both eyes 2 (two) times daily.    Yes [provider]  diltiazem  (CARDIZEM  SR) 60 MG 12 hr capsule Take 1 capsule (60 mg total) by mouth every 8 (eight) hours as needed (Palpitations). 02/15/24  Yes  Mallipeddi, Vishnu P, MD  escitalopram  (LEXAPRO ) 20 MG tablet Take 1 tablet (20 mg total) by mouth daily. 02/28/24 02/27/25 Yes Okey Barnie SAUNDERS, MD  fluticasone  (FLONASE ) 50 MCG/ACT nasal spray Place 2 sprays into the nose daily as needed for allergies.    Yes [provider]  gabapentin  (NEURONTIN ) 100 MG capsule Take 1 capsule (100 mg total) by mouth 2 (two) times daily. 03/22/24  Yes Nida, Gebreselassie W, MD  glucose blood test strip Test 4 times a day, she has freestyle lite meter 12/31/19  Yes Nida, Gebreselassie W, MD  Insulin  Pen Needle (B-D ULTRAFINE III SHORT PEN) 31G X 8 MM MISC 1 each by Does not apply route as directed. 09/04/19  Yes Nida, Gebreselassie W, MD  loratadine  (CLARITIN ) 10 MG tablet Take 10 mg by mouth daily.   Yes [provider]  lubiprostone  (AMITIZA ) 24 MCG capsule Take 1 capsule (24 mcg total) by mouth 2 (two) times daily with a meal. 04/02/24  Yes Mahon, Courtney L, NP  meclizine  (ANTIVERT ) 25 MG tablet Take 25  mg by mouth 2 (two) times daily as needed. 04/18/24  Yes [provider]  meloxicam  (MOBIC ) 15 MG tablet Take 15 mg by mouth daily. 02/15/24  Yes [provider]  Menthol , Topical Analgesic, (BENGAY EX) Apply 1 Application topically daily as needed (pain).   Yes [provider]  metFORMIN  (GLUCOPHAGE -XR) 500 MG 24 hr tablet Take 1 tablet (500 mg total) by mouth daily with breakfast. 05/18/24  Yes Nida, Gebreselassie W, MD  Multiple Vitamin (MULTIVITAMIN WITH MINERALS) TABS tablet Take 1 tablet by mouth daily. celebrate multivitamin 2 in 1   Yes [provider]  Multiple Vitamins-Minerals (IMMUNE SYSTEM BOOSTER PO) Take 1 tablet by mouth daily.   Yes [provider]  NON FORMULARY Pt uses a cpap nightly   Yes [provider]  omeprazole (PRILOSEC) 20 MG capsule Take 20 mg by mouth daily.   Yes [provider]  polyethylene glycol (MIRALAX  / GLYCOLAX ) 17 g packet Take 17 g by mouth daily.    Yes [provider]  Probiotic Product (PROBIOTIC PO) Take 1 tablet by mouth daily.   Yes [provider]  rizatriptan  (MAXALT -MLT) 10 MG disintegrating tablet Take 10 mg by mouth as needed for migraine. May repeat in 2 hours if needed   Yes [provider]  temazepam  (RESTORIL ) 30 MG capsule Take 1 capsule (30 mg total) by mouth at bedtime. 06/11/24  Yes Okey Barnie SAUNDERS, MD  tirzepatide  (MOUNJARO ) 5 MG/0.5ML Pen Inject 5 mg into the skin once a week. 05/17/24  Yes Lenis Ethelle ORN, MD    Allergies as of 06/12/2024   (No Active Allergies)    Family History  Problem Relation Age of Onset   Ovarian cancer Mother    Heart disease Mother    Anxiety disorder Mother    Cirrhosis Father        deceased age 7   Alcohol  abuse Father    Anxiety disorder Sister    Dementia Brother    Dementia Maternal Grandfather    Liver cancer Cousin        age 51, deceased   Drug abuse Cousin    ADD / ADHD Son    Colon cancer Other        aunt, deceased age 20   Breast cancer Other        aunt, deceased age 70   Bipolar disorder Neg Hx    Depression Neg Hx    OCD Neg Hx    Paranoid behavior Neg Hx    Schizophrenia Neg Hx    Seizures Neg Hx    Sexual abuse Neg Hx    Physical abuse Neg Hx    Sleep apnea Neg Hx     Social History   Socioeconomic History   Marital status: Widowed    Spouse name: Not on file   Number of children: 2   Years of education: college   Highest education level: Not on file  Occupational History   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    Employer: DISABLED  Tobacco Use   Smoking status: Never    Passive exposure: Never   Smokeless tobacco: Never  Vaping Use   Vaping status: Never Used  Substance and Sexual Activity   Alcohol  use: Not Currently   Drug use: Never   Sexual activity: Yes    Birth control/protection: Surgical    Comment: hyst  Other Topics Concern   Not on file  Social History Narrative  Not on  file   Social Drivers of Health   Tobacco Use: Low Risk (06/12/2024)   Patient History    Smoking Tobacco Use: Never    Smokeless Tobacco Use: Never    Passive Exposure: Never  Financial Resource Strain: Low Risk (01/24/2024)   Overall Financial Resource Strain (CARDIA)    Difficulty of Paying Living Expenses: Not hard at all  Food Insecurity: No Food Insecurity (01/24/2024)   Epic    Worried About Programme Researcher, Broadcasting/film/video in the Last Year: Never true    Ran Out of Food in the Last Year: Never true  Transportation Needs: No Transportation Needs (01/24/2024)   Epic    Lack of Transportation (Medical): No    Lack of Transportation (Non-Medical): No  Physical Activity: Insufficiently Active (01/24/2024)   Exercise Vital Sign    Days of Exercise per Week: 4 days    Minutes of Exercise per Session: 30 min  Stress: Stress Concern Present (01/24/2024)   [employer] of Occupational Health - Occupational Stress Questionnaire    Feeling of Stress: Very much  Social Connections: Moderately Integrated (01/24/2024)   Social Connection and Isolation Panel    Frequency of Communication with Friends and Family: More than three times a week    Frequency of Social Gatherings with Friends and Family: More than three times a week    Attends Religious Services: More than 4 times per year    Active Member of [employer] or Organizations: Yes    Attends Banker Meetings: More than 4 times per year    Marital Status: Widowed  Intimate Partner Violence: Not At Risk (01/24/2024)   Epic    Fear of Current or Ex-Partner: No    Emotionally Abused: No    Physically Abused: No    Sexually Abused: No  Depression (PHQ2-9): High Risk (01/24/2024)   Depression (PHQ2-9)    PHQ-2 Score: 14  Alcohol  Screen: Low Risk (01/24/2024)   Alcohol  Screen    Last Alcohol  Screening Score (AUDIT): 0  Housing: Unknown (01/24/2024)   Epic    Unable to Pay for Housing in the Last Year: No    Number of Times Moved in the  Last Year: Not on file    Homeless in the Last Year: No  Utilities: Not At Risk (01/24/2024)   Epic    Threatened with loss of utilities: No  Health Literacy: Adequate Health Literacy (01/24/2024)   B1300 Health Literacy    Frequency of need for help with medical instructions: Never    Review of Systems   See HPI, otherwise negative ROS  Physical Exam: BP (!) 141/88   Pulse 83   Temp (!) 97.5 F (36.4 C) (Oral)   Ht 5' 7 (1.702 m)   Wt 267 lb 6.4 oz (121.3 kg)   SpO2 95%   BMI 41.88 kg/m  General:   Alert,  Well-developed, well-nourished, pleasant and cooperative in NAD Neck:  Supple; no masses or thyromegaly. No significant cervical adenopathy. Lungs:  Clear throughout to auscultation.   No wheezes, crackles, or rhonchi. No acute distress. Heart:  Regular rate and rhythm; no murmurs, clicks, rubs,  or gallops. Abdomen:   Large panniculus.  Positive bowel sounds that she does have left lower quadrant/pelvic discomfort to palpation no appreciable mass organomegaly  Impression/Plan:   64 year old morbidly obese lady with multiple comorbidities returns for follow-up constipation left lower quadrant abdominal pain constipation now much better on Amitiza  however left lower quadrant abdominal pain radiating  to her back persist.  She has known abdominal wall hernia history of complicated diverticulitis.  She may have abdominal wall etiology I doubt its stool load now that she is moving her bowels better; urinary pathology is not excluded as is radicular pathology from her back  Recommendations:  Amitiza  24 mcg twice daily is working well for constipation.  Continue omeprazole 20 mg daily for good control of acid reflux.  We will  get an updated CT to make sure nothing new has developed since your last scan.  Plan for repeat colonoscopy for surveillance purposes in 2028  Further recommendations to follow after CT scan results are available for review.  Notice: This dictation was  prepared with Dragon dictation along with smaller phrase technology. Any transcriptional errors that result from this process are unintentional and may not be corrected upon review.  "

## 2024-06-13 ENCOUNTER — Telehealth: Payer: Self-pay | Admitting: *Deleted

## 2024-06-13 ENCOUNTER — Other Ambulatory Visit: Payer: Self-pay | Admitting: *Deleted

## 2024-06-13 DIAGNOSIS — R1030 Lower abdominal pain, unspecified: Secondary | ICD-10-CM

## 2024-06-13 NOTE — Telephone Encounter (Signed)
 Spoke with pt. She is aware of CT appt 2/5 arrival 8:30am to start drinking oral contrast.

## 2024-06-14 ENCOUNTER — Ambulatory Visit (HOSPITAL_COMMUNITY): Admission: RE | Admit: 2024-06-14 | Discharge: 2024-06-14 | Attending: Internal Medicine | Admitting: Internal Medicine

## 2024-06-14 ENCOUNTER — Ambulatory Visit: Payer: Self-pay | Admitting: Internal Medicine

## 2024-06-14 DIAGNOSIS — R1030 Lower abdominal pain, unspecified: Secondary | ICD-10-CM

## 2024-06-14 LAB — POCT I-STAT CREATININE: Creatinine, Ser: 0.8 mg/dL (ref 0.44–1.00)

## 2024-06-14 MED ORDER — IOHEXOL 300 MG/ML  SOLN
100.0000 mL | Freq: Once | INTRAMUSCULAR | Status: AC | PRN
  Administered 2024-06-14: 100 mL via INTRAVENOUS

## 2024-06-14 MED ORDER — IOHEXOL 9 MG/ML PO SOLN
500.0000 mL | ORAL | Status: AC
  Administered 2024-06-14: 500 mL via ORAL

## 2024-06-14 MED ORDER — IOHEXOL 9 MG/ML PO SOLN
ORAL | Status: AC
  Filled 2024-06-14: qty 1000

## 2024-06-15 ENCOUNTER — Encounter: Payer: Self-pay | Admitting: *Deleted

## 2024-06-15 ENCOUNTER — Other Ambulatory Visit (HOSPITAL_COMMUNITY)

## 2024-06-15 ENCOUNTER — Other Ambulatory Visit: Payer: Self-pay | Admitting: *Deleted

## 2024-06-15 DIAGNOSIS — N281 Cyst of kidney, acquired: Secondary | ICD-10-CM

## 2024-06-18 ENCOUNTER — Encounter (HOSPITAL_COMMUNITY): Admission: RE | Payer: Self-pay | Source: Home / Self Care

## 2024-06-18 ENCOUNTER — Ambulatory Visit (HOSPITAL_COMMUNITY): Admission: RE | Admit: 2024-06-18 | Source: Home / Self Care | Admitting: Internal Medicine

## 2024-06-20 ENCOUNTER — Telehealth (HOSPITAL_COMMUNITY): Admitting: Psychiatry

## 2024-06-25 ENCOUNTER — Ambulatory Visit (HOSPITAL_COMMUNITY)

## 2024-07-05 ENCOUNTER — Ambulatory Visit: Admitting: Gastroenterology

## 2024-08-02 ENCOUNTER — Ambulatory Visit: Admitting: "Endocrinology

## 2024-08-09 ENCOUNTER — Ambulatory Visit: Admitting: Adult Health

## 2024-08-09 ENCOUNTER — Telehealth: Admitting: Adult Health
